# Patient Record
Sex: Male | Born: 1980
Health system: Southern US, Community
[De-identification: ages and names within clinical notes are randomized; demographics above are authoritative.]

## PROBLEM LIST (undated history)

## (undated) ENCOUNTER — Emergency Department (HOSPITAL_COMMUNITY): Admission: EM | Payer: Medicare Other | Source: Home / Self Care

## (undated) DIAGNOSIS — F419 Anxiety disorder, unspecified: Secondary | ICD-10-CM

## (undated) DIAGNOSIS — N259 Disorder resulting from impaired renal tubular function, unspecified: Secondary | ICD-10-CM

## (undated) DIAGNOSIS — R2689 Other abnormalities of gait and mobility: Secondary | ICD-10-CM

## (undated) DIAGNOSIS — E039 Hypothyroidism, unspecified: Secondary | ICD-10-CM

## (undated) DIAGNOSIS — I10 Essential (primary) hypertension: Secondary | ICD-10-CM

## (undated) DIAGNOSIS — R252 Cramp and spasm: Secondary | ICD-10-CM

## (undated) DIAGNOSIS — E669 Obesity, unspecified: Secondary | ICD-10-CM

## (undated) DIAGNOSIS — M8580 Other specified disorders of bone density and structure, unspecified site: Secondary | ICD-10-CM

## (undated) DIAGNOSIS — J986 Disorders of diaphragm: Secondary | ICD-10-CM

## (undated) DIAGNOSIS — E785 Hyperlipidemia, unspecified: Secondary | ICD-10-CM

## (undated) DIAGNOSIS — Z85841 Personal history of malignant neoplasm of brain: Secondary | ICD-10-CM

## (undated) DIAGNOSIS — R7303 Prediabetes: Secondary | ICD-10-CM

## (undated) DIAGNOSIS — Z5189 Encounter for other specified aftercare: Secondary | ICD-10-CM

## (undated) DIAGNOSIS — F988 Other specified behavioral and emotional disorders with onset usually occurring in childhood and adolescence: Secondary | ICD-10-CM

## (undated) DIAGNOSIS — E230 Hypopituitarism: Secondary | ICD-10-CM

## (undated) DIAGNOSIS — T380X5A Adverse effect of glucocorticoids and synthetic analogues, initial encounter: Secondary | ICD-10-CM

## (undated) DIAGNOSIS — J3089 Other allergic rhinitis: Secondary | ICD-10-CM

## (undated) DIAGNOSIS — G40909 Epilepsy, unspecified, not intractable, without status epilepticus: Secondary | ICD-10-CM

## (undated) HISTORY — PX: OTHER SURGICAL HISTORY: SHX169

## (undated) HISTORY — DX: Cramp and spasm: R25.2

## (undated) HISTORY — DX: Adverse effect of glucocorticoids and synthetic analogues, initial encounter: T38.0X5A

## (undated) HISTORY — DX: Hypothyroidism, unspecified: E03.9

## (undated) HISTORY — PX: PORTACATH PLACEMENT: SHX2246

## (undated) HISTORY — DX: Obesity, unspecified: E66.9

## (undated) HISTORY — DX: Other specified disorders of bone density and structure, unspecified site: M85.80

## (undated) HISTORY — DX: Disorder resulting from impaired renal tubular function, unspecified: N25.9

## (undated) HISTORY — DX: Epilepsy, unspecified, not intractable, without status epilepticus: G40.909

## (undated) HISTORY — DX: Other allergic rhinitis: J30.89

## (undated) HISTORY — DX: Anxiety disorder, unspecified: F41.9

## (undated) HISTORY — DX: Personal history of malignant neoplasm of brain: Z85.841

## (undated) HISTORY — DX: Essential (primary) hypertension: I10

## (undated) HISTORY — DX: Other specified behavioral and emotional disorders with onset usually occurring in childhood and adolescence: F98.8

## (undated) HISTORY — DX: Disorders of diaphragm: J98.6

## (undated) HISTORY — DX: Hyperlipidemia, unspecified: E78.5

## (undated) HISTORY — DX: Encounter for other specified aftercare: Z51.89

## (undated) HISTORY — DX: Hypopituitarism: E23.0

---
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
## 2001-04-23 ENCOUNTER — Encounter: Admission: RE | Admit: 2001-04-23 | Discharge: 2001-04-23 | Payer: Self-pay | Admitting: Infectious Diseases

## 2001-05-03 ENCOUNTER — Encounter: Admission: RE | Admit: 2001-05-03 | Discharge: 2001-05-03 | Payer: Self-pay | Admitting: Infectious Diseases

## 2001-05-17 ENCOUNTER — Encounter: Admission: RE | Admit: 2001-05-17 | Discharge: 2001-05-17 | Payer: Self-pay | Admitting: Infectious Diseases

## 2004-02-14 ENCOUNTER — Emergency Department (HOSPITAL_COMMUNITY): Admission: EM | Admit: 2004-02-14 | Discharge: 2004-02-15 | Payer: Self-pay | Admitting: Emergency Medicine

## 2004-02-16 ENCOUNTER — Ambulatory Visit (HOSPITAL_COMMUNITY): Admission: RE | Admit: 2004-02-16 | Discharge: 2004-02-16 | Payer: Self-pay | Admitting: Pediatrics

## 2004-03-02 ENCOUNTER — Ambulatory Visit (HOSPITAL_COMMUNITY): Admission: RE | Admit: 2004-03-02 | Discharge: 2004-03-02 | Payer: Self-pay | Admitting: Pediatrics

## 2005-10-20 ENCOUNTER — Emergency Department (HOSPITAL_COMMUNITY): Admission: EM | Admit: 2005-10-20 | Discharge: 2005-10-20 | Payer: Self-pay | Admitting: Family Medicine

## 2005-11-29 ENCOUNTER — Ambulatory Visit: Payer: Self-pay | Admitting: Internal Medicine

## 2005-12-04 ENCOUNTER — Ambulatory Visit: Payer: Self-pay | Admitting: Hospitalist

## 2005-12-27 ENCOUNTER — Ambulatory Visit: Payer: Self-pay | Admitting: Internal Medicine

## 2006-01-16 ENCOUNTER — Ambulatory Visit: Payer: Self-pay | Admitting: Internal Medicine

## 2006-02-14 ENCOUNTER — Ambulatory Visit: Payer: Self-pay | Admitting: Internal Medicine

## 2006-03-07 ENCOUNTER — Ambulatory Visit: Payer: Self-pay | Admitting: Internal Medicine

## 2006-03-28 ENCOUNTER — Ambulatory Visit: Payer: Self-pay | Admitting: Internal Medicine

## 2006-04-25 ENCOUNTER — Ambulatory Visit: Payer: Self-pay | Admitting: Internal Medicine

## 2006-05-17 ENCOUNTER — Ambulatory Visit: Payer: Self-pay | Admitting: Internal Medicine

## 2006-06-07 ENCOUNTER — Ambulatory Visit: Payer: Self-pay | Admitting: Internal Medicine

## 2006-07-04 ENCOUNTER — Ambulatory Visit: Payer: Self-pay | Admitting: Internal Medicine

## 2006-07-09 ENCOUNTER — Ambulatory Visit: Payer: Self-pay | Admitting: Internal Medicine

## 2006-07-25 ENCOUNTER — Ambulatory Visit: Payer: Self-pay | Admitting: Internal Medicine

## 2006-08-08 DIAGNOSIS — F988 Other specified behavioral and emotional disorders with onset usually occurring in childhood and adolescence: Secondary | ICD-10-CM

## 2006-08-08 DIAGNOSIS — E785 Hyperlipidemia, unspecified: Secondary | ICD-10-CM | POA: Insufficient documentation

## 2006-08-08 DIAGNOSIS — E230 Hypopituitarism: Secondary | ICD-10-CM

## 2006-08-08 DIAGNOSIS — G40909 Epilepsy, unspecified, not intractable, without status epilepticus: Secondary | ICD-10-CM

## 2006-08-08 HISTORY — DX: Other specified behavioral and emotional disorders with onset usually occurring in childhood and adolescence: F98.8

## 2006-08-08 HISTORY — DX: Hyperlipidemia, unspecified: E78.5

## 2006-08-08 HISTORY — DX: Hypopituitarism: E23.0

## 2006-08-08 HISTORY — DX: Epilepsy, unspecified, not intractable, without status epilepticus: G40.909

## 2006-08-15 ENCOUNTER — Ambulatory Visit: Payer: Self-pay | Admitting: Internal Medicine

## 2006-09-05 ENCOUNTER — Ambulatory Visit: Payer: Self-pay | Admitting: Internal Medicine

## 2006-09-26 ENCOUNTER — Ambulatory Visit: Payer: Self-pay | Admitting: Hospitalist

## 2006-10-24 ENCOUNTER — Ambulatory Visit: Payer: Self-pay | Admitting: Internal Medicine

## 2006-10-24 LAB — CONVERTED CEMR LAB
ALT: 36 units/L (ref 0–53)
Alkaline Phosphatase: 65 units/L (ref 39–117)
BUN: 14 mg/dL (ref 6–23)
CO2: 25 meq/L (ref 19–32)
Calcium: 9.1 mg/dL (ref 8.4–10.5)
Total Bilirubin: 0.6 mg/dL (ref 0.3–1.2)
Total Protein: 7.1 g/dL (ref 6.0–8.3)

## 2006-11-16 ENCOUNTER — Ambulatory Visit: Payer: Self-pay | Admitting: Hospitalist

## 2006-11-22 ENCOUNTER — Ambulatory Visit: Payer: Self-pay | Admitting: Internal Medicine

## 2006-11-22 ENCOUNTER — Encounter (INDEPENDENT_AMBULATORY_CARE_PROVIDER_SITE_OTHER): Payer: Self-pay | Admitting: Internal Medicine

## 2006-11-22 LAB — CONVERTED CEMR LAB
BUN: 10 mg/dL (ref 6–23)
Basophils Absolute: 0 10*3/uL (ref 0.0–0.1)
Basophils Relative: 0 % (ref 0–1)
Calcium: 9.9 mg/dL (ref 8.4–10.5)
Creatinine, Ser: 1.39 mg/dL (ref 0.40–1.50)
Eosinophils Relative: 1 % (ref 0–5)
Free T4: 1.53 ng/dL (ref 0.89–1.80)
HCT: 50.1 % (ref 39.0–52.0)
MCV: 96.5 fL (ref 78.0–100.0)
Monocytes Relative: 8 % (ref 3–11)
Neutrophils Relative %: 58 % (ref 43–77)
RBC: 5.19 M/uL (ref 4.22–5.81)
RDW: 15.8 % — ABNORMAL HIGH (ref 11.5–14.0)

## 2006-11-25 DIAGNOSIS — R252 Cramp and spasm: Secondary | ICD-10-CM | POA: Insufficient documentation

## 2006-11-25 HISTORY — DX: Cramp and spasm: R25.2

## 2006-12-07 ENCOUNTER — Ambulatory Visit (HOSPITAL_COMMUNITY): Admission: RE | Admit: 2006-12-07 | Discharge: 2006-12-07 | Payer: Self-pay | Admitting: Internal Medicine

## 2006-12-07 ENCOUNTER — Ambulatory Visit: Payer: Self-pay | Admitting: Internal Medicine

## 2006-12-28 ENCOUNTER — Ambulatory Visit: Payer: Self-pay | Admitting: Internal Medicine

## 2006-12-31 ENCOUNTER — Telehealth (INDEPENDENT_AMBULATORY_CARE_PROVIDER_SITE_OTHER): Payer: Self-pay | Admitting: Hospitalist

## 2007-01-02 ENCOUNTER — Telehealth: Payer: Self-pay | Admitting: *Deleted

## 2007-01-18 ENCOUNTER — Ambulatory Visit: Payer: Self-pay | Admitting: Internal Medicine

## 2007-02-05 ENCOUNTER — Ambulatory Visit: Payer: Self-pay | Admitting: Internal Medicine

## 2007-02-25 ENCOUNTER — Telehealth (INDEPENDENT_AMBULATORY_CARE_PROVIDER_SITE_OTHER): Payer: Self-pay | Admitting: *Deleted

## 2007-02-26 ENCOUNTER — Ambulatory Visit: Payer: Self-pay | Admitting: Internal Medicine

## 2007-03-18 ENCOUNTER — Telehealth (INDEPENDENT_AMBULATORY_CARE_PROVIDER_SITE_OTHER): Payer: Self-pay | Admitting: *Deleted

## 2007-03-19 ENCOUNTER — Ambulatory Visit: Payer: Self-pay | Admitting: Internal Medicine

## 2007-03-19 ENCOUNTER — Telehealth (INDEPENDENT_AMBULATORY_CARE_PROVIDER_SITE_OTHER): Payer: Self-pay | Admitting: *Deleted

## 2007-04-09 ENCOUNTER — Ambulatory Visit: Payer: Self-pay | Admitting: Internal Medicine

## 2007-04-30 ENCOUNTER — Ambulatory Visit: Payer: Self-pay | Admitting: Internal Medicine

## 2007-05-24 ENCOUNTER — Ambulatory Visit: Payer: Self-pay | Admitting: Internal Medicine

## 2007-06-14 ENCOUNTER — Telehealth (INDEPENDENT_AMBULATORY_CARE_PROVIDER_SITE_OTHER): Payer: Self-pay | Admitting: *Deleted

## 2007-06-17 ENCOUNTER — Ambulatory Visit: Payer: Self-pay | Admitting: Internal Medicine

## 2007-06-20 ENCOUNTER — Emergency Department (HOSPITAL_COMMUNITY): Admission: EM | Admit: 2007-06-20 | Discharge: 2007-06-20 | Payer: Self-pay | Admitting: Emergency Medicine

## 2007-07-02 ENCOUNTER — Telehealth: Payer: Self-pay | Admitting: Internal Medicine

## 2007-07-10 ENCOUNTER — Ambulatory Visit: Payer: Self-pay | Admitting: Internal Medicine

## 2007-07-10 LAB — CONVERTED CEMR LAB
Basophils Absolute: 0 10*3/uL (ref 0.0–0.1)
Chloride: 101 meq/L (ref 96–112)
Cholesterol: 264 mg/dL — ABNORMAL HIGH (ref 0–200)
Eosinophils Absolute: 0.1 10*3/uL (ref 0.0–0.7)
Free T4: 1.61 ng/dL (ref 0.89–1.80)
Glucose, Bld: 85 mg/dL (ref 70–99)
HDL: 49 mg/dL (ref >39)
Hemoglobin: 16.1 g/dL (ref 13.0–17.0)
Lymphocytes Relative: 43 % (ref 12–46)
MCHC: 34.2 g/dL (ref 30.0–36.0)
Potassium: 3.9 meq/L (ref 3.5–5.3)
RBC: 5.14 M/uL (ref 4.22–5.81)
RDW: 14.9 % — ABNORMAL HIGH (ref 11.5–14.0)
Testosterone: 261.11 ng/dL — ABNORMAL LOW (ref 350–890)
Total CHOL/HDL Ratio: 5.4
Total Protein: 7.6 g/dL (ref 6.0–8.3)
Triglycerides: 267 mg/dL — ABNORMAL HIGH (ref <150)
VLDL: 53 mg/dL — ABNORMAL HIGH (ref 0–40)

## 2007-07-31 ENCOUNTER — Ambulatory Visit: Payer: Self-pay | Admitting: Internal Medicine

## 2007-08-05 ENCOUNTER — Telehealth: Payer: Self-pay | Admitting: Internal Medicine

## 2007-08-05 ENCOUNTER — Telehealth: Payer: Self-pay | Admitting: *Deleted

## 2007-08-29 ENCOUNTER — Telehealth: Payer: Self-pay | Admitting: Internal Medicine

## 2007-08-29 ENCOUNTER — Ambulatory Visit: Payer: Self-pay | Admitting: Hospitalist

## 2007-09-04 ENCOUNTER — Ambulatory Visit: Payer: Self-pay | Admitting: Infectious Diseases

## 2007-09-30 ENCOUNTER — Ambulatory Visit: Payer: Self-pay | Admitting: *Deleted

## 2007-10-14 ENCOUNTER — Ambulatory Visit: Payer: Self-pay | Admitting: Internal Medicine

## 2007-10-24 ENCOUNTER — Ambulatory Visit: Payer: Self-pay | Admitting: Internal Medicine

## 2007-10-24 LAB — CONVERTED CEMR LAB
Albumin: 4.7 g/dL (ref 3.5–5.2)
BUN: 12 mg/dL (ref 6–23)
Basophils Relative: 0 % (ref 0–1)
Creatinine, Ser: 1.13 mg/dL (ref 0.40–1.50)
Eosinophils Relative: 1 % (ref 0–5)
Lymphs Abs: 3.8 10*3/uL (ref 0.7–4.0)
MCHC: 32.9 g/dL (ref 30.0–36.0)
Platelets: 181 10*3/uL (ref 150–400)
RBC: 5.06 M/uL (ref 4.22–5.81)
RDW: 15.9 % — ABNORMAL HIGH (ref 11.5–15.5)
Sodium: 144 meq/L (ref 135–145)
Total Bilirubin: 0.7 mg/dL (ref 0.3–1.2)
Total Protein: 7.6 g/dL (ref 6.0–8.3)
WBC: 9.3 10*3/uL (ref 4.0–10.5)

## 2007-11-11 ENCOUNTER — Telehealth: Payer: Self-pay | Admitting: *Deleted

## 2007-11-12 ENCOUNTER — Emergency Department (HOSPITAL_COMMUNITY): Admission: EM | Admit: 2007-11-12 | Discharge: 2007-11-12 | Payer: Self-pay | Admitting: Emergency Medicine

## 2007-11-13 ENCOUNTER — Telehealth (INDEPENDENT_AMBULATORY_CARE_PROVIDER_SITE_OTHER): Payer: Self-pay | Admitting: *Deleted

## 2007-11-14 ENCOUNTER — Emergency Department (HOSPITAL_COMMUNITY): Admission: EM | Admit: 2007-11-14 | Discharge: 2007-11-14 | Payer: Self-pay | Admitting: Emergency Medicine

## 2007-11-18 ENCOUNTER — Ambulatory Visit: Payer: Self-pay | Admitting: Internal Medicine

## 2007-11-29 ENCOUNTER — Ambulatory Visit: Payer: Self-pay | Admitting: Internal Medicine

## 2007-11-29 LAB — CONVERTED CEMR LAB
ALT: 26 units/L (ref 0–53)
AST: 19 units/L (ref 0–37)
Albumin: 4.4 g/dL (ref 3.5–5.2)
BUN: 13 mg/dL (ref 6–23)
Basophils Relative: 0 % (ref 0–1)
Chloride: 106 meq/L (ref 96–112)
Creatinine, Ser: 1.14 mg/dL (ref 0.40–1.50)
Free T4: 1.76 ng/dL (ref 0.89–1.80)
Glucose, Bld: 94 mg/dL (ref 70–99)
Lymphocytes Relative: 41 % (ref 12–46)
MCHC: 32.7 g/dL (ref 30.0–36.0)
Neutro Abs: 5.2 10*3/uL (ref 1.7–7.7)
Platelets: 216 10*3/uL (ref 150–400)
RDW: 15.4 % (ref 11.5–15.5)
Sodium: 143 meq/L (ref 135–145)
Total Bilirubin: 0.5 mg/dL (ref 0.3–1.2)
Total Protein: 7.3 g/dL (ref 6.0–8.3)
WBC: 10.1 10*3/uL (ref 4.0–10.5)

## 2007-12-09 ENCOUNTER — Ambulatory Visit: Payer: Self-pay | Admitting: Internal Medicine

## 2008-01-08 ENCOUNTER — Ambulatory Visit: Payer: Self-pay | Admitting: Internal Medicine

## 2008-01-10 ENCOUNTER — Encounter: Payer: Self-pay | Admitting: Internal Medicine

## 2008-01-21 ENCOUNTER — Telehealth: Payer: Self-pay | Admitting: Internal Medicine

## 2008-01-30 ENCOUNTER — Emergency Department (HOSPITAL_COMMUNITY): Admission: EM | Admit: 2008-01-30 | Discharge: 2008-01-30 | Payer: Self-pay | Admitting: Family Medicine

## 2008-02-07 ENCOUNTER — Ambulatory Visit: Payer: Self-pay | Admitting: Hospitalist

## 2008-03-04 ENCOUNTER — Ambulatory Visit: Payer: Self-pay | Admitting: Internal Medicine

## 2008-03-04 LAB — CONVERTED CEMR LAB
AST: 26 units/L (ref 0–37)
Albumin: 5 g/dL (ref 3.5–5.2)
Alkaline Phosphatase: 49 units/L (ref 39–117)
BUN: 21 mg/dL (ref 6–23)
Basophils Absolute: 0 10*3/uL (ref 0.0–0.1)
Basophils Relative: 0 % (ref 0–1)
CO2: 24 meq/L (ref 19–32)
Hemoglobin: 15.4 g/dL (ref 13.0–17.0)
Lymphocytes Relative: 50 % — ABNORMAL HIGH (ref 12–46)
Lymphs Abs: 5.3 10*3/uL — ABNORMAL HIGH (ref 0.7–4.0)
Neutro Abs: 4.4 10*3/uL (ref 1.7–7.7)
Neutrophils Relative %: 41 % — ABNORMAL LOW (ref 43–77)
Potassium: 3.8 meq/L (ref 3.5–5.3)
Total Bilirubin: 0.9 mg/dL (ref 0.3–1.2)

## 2008-03-11 ENCOUNTER — Telehealth: Payer: Self-pay | Admitting: *Deleted

## 2008-03-12 ENCOUNTER — Ambulatory Visit: Payer: Self-pay | Admitting: Internal Medicine

## 2008-03-12 DIAGNOSIS — R61 Generalized hyperhidrosis: Secondary | ICD-10-CM | POA: Insufficient documentation

## 2008-03-12 LAB — CONVERTED CEMR LAB
ALT: 28 units/L (ref 0–53)
AST: 21 units/L (ref 0–37)
BUN: 13 mg/dL (ref 6–23)
Basophils Absolute: 0 10*3/uL (ref 0.0–0.1)
CRP: 0.1 mg/dL (ref <0.6)
Calcium: 9.5 mg/dL (ref 8.4–10.5)
Eosinophils Absolute: 0.1 10*3/uL (ref 0.0–0.7)
Eosinophils Relative: 1 % (ref 0–5)
Hemoglobin, Urine: NEGATIVE
Hemoglobin: 14.5 g/dL (ref 13.0–17.0)
Ketones, ur: NEGATIVE mg/dL
Leukocytes, UA: NEGATIVE
Lymphocytes Relative: 39 % (ref 12–46)
Lymphs Abs: 4 10*3/uL (ref 0.7–4.0)
MCHC: 33 g/dL (ref 30.0–36.0)
Nitrite: NEGATIVE
Potassium: 3.6 meq/L (ref 3.5–5.3)
RBC: 4.68 M/uL (ref 4.22–5.81)
RDW: 14.7 % (ref 11.5–15.5)
Sodium: 141 meq/L (ref 135–145)
Specific Gravity, Urine: 1.031 — ABNORMAL HIGH (ref 1.005–1.03)
TSH: 0.007 microintl units/mL — ABNORMAL LOW (ref 0.350–5.50)
Total Bilirubin: 0.5 mg/dL (ref 0.3–1.2)
Urobilinogen, UA: 0.2 (ref 0.0–1.0)

## 2008-03-16 ENCOUNTER — Ambulatory Visit (HOSPITAL_COMMUNITY): Admission: RE | Admit: 2008-03-16 | Discharge: 2008-03-16 | Payer: Self-pay | Admitting: Internal Medicine

## 2008-03-16 ENCOUNTER — Encounter: Payer: Self-pay | Admitting: Internal Medicine

## 2008-03-27 ENCOUNTER — Ambulatory Visit: Payer: Self-pay | Admitting: Internal Medicine

## 2008-03-27 DIAGNOSIS — F809 Developmental disorder of speech and language, unspecified: Secondary | ICD-10-CM

## 2008-03-27 DIAGNOSIS — T380X5A Adverse effect of glucocorticoids and synthetic analogues, initial encounter: Secondary | ICD-10-CM

## 2008-03-27 DIAGNOSIS — M8580 Other specified disorders of bone density and structure, unspecified site: Secondary | ICD-10-CM

## 2008-03-27 HISTORY — DX: Adverse effect of glucocorticoids and synthetic analogues, initial encounter: M85.80

## 2008-03-27 LAB — CONVERTED CEMR LAB
Calcium, Total (PTH): 9.2 mg/dL (ref 8.4–10.5)
PTH: 30.6 pg/mL (ref 14.0–72.0)
Testosterone: 180.54 ng/dL — ABNORMAL LOW (ref 350–890)
Vit D, 1,25-Dihydroxy: 24 — ABNORMAL LOW (ref 30–89)

## 2008-04-03 ENCOUNTER — Telehealth: Payer: Self-pay | Admitting: *Deleted

## 2008-04-07 ENCOUNTER — Ambulatory Visit: Payer: Self-pay | Admitting: Internal Medicine

## 2008-04-07 LAB — CONVERTED CEMR LAB
Creatinine, Ser: 1.29 mg/dL (ref 0.40–1.50)
Phosphorus: 2.3 mg/dL (ref 2.3–4.6)
Potassium: 4.9 meq/L (ref 3.5–5.3)
Sodium: 148 meq/L — ABNORMAL HIGH (ref 135–145)

## 2008-04-13 ENCOUNTER — Encounter: Admission: RE | Admit: 2008-04-13 | Discharge: 2008-05-08 | Payer: Self-pay | Admitting: Internal Medicine

## 2008-04-13 ENCOUNTER — Encounter: Payer: Self-pay | Admitting: Internal Medicine

## 2008-04-17 ENCOUNTER — Telehealth: Payer: Self-pay | Admitting: Internal Medicine

## 2008-04-22 ENCOUNTER — Ambulatory Visit (HOSPITAL_COMMUNITY): Admission: RE | Admit: 2008-04-22 | Discharge: 2008-04-22 | Payer: Self-pay | Admitting: Internal Medicine

## 2008-04-22 ENCOUNTER — Ambulatory Visit: Payer: Self-pay | Admitting: Internal Medicine

## 2008-04-22 DIAGNOSIS — M79609 Pain in unspecified limb: Secondary | ICD-10-CM

## 2008-04-22 LAB — CONVERTED CEMR LAB
ALT: 56 units/L — ABNORMAL HIGH (ref 0–53)
AST: 34 units/L (ref 0–37)
BUN: 14 mg/dL (ref 6–23)
Basophils Relative: 0 % (ref 0–1)
Calcium: 9.5 mg/dL (ref 8.4–10.5)
Creatinine, Ser: 1.22 mg/dL (ref 0.40–1.50)
Eosinophils Relative: 2 % (ref 0–5)
Free T4: 1.56 ng/dL (ref 0.89–1.80)
Glucose, Bld: 80 mg/dL (ref 70–99)
Lymphs Abs: 4.2 10*3/uL — ABNORMAL HIGH (ref 0.7–4.0)
MCHC: 31.8 g/dL (ref 30.0–36.0)
MCV: 95.3 fL (ref 78.0–100.0)
Monocytes Absolute: 0.8 10*3/uL (ref 0.1–1.0)
Monocytes Relative: 8 % (ref 3–12)
Neutro Abs: 4.5 10*3/uL (ref 1.7–7.7)
Neutrophils Relative %: 47 % (ref 43–77)
Potassium: 4.1 meq/L (ref 3.5–5.3)
Sodium: 140 meq/L (ref 135–145)
Testosterone: 200.74 ng/dL — ABNORMAL LOW (ref 350–890)
Total Bilirubin: 0.5 mg/dL (ref 0.3–1.2)
Total Protein: 7.4 g/dL (ref 6.0–8.3)
WBC: 9.7 10*3/uL (ref 4.0–10.5)

## 2008-05-01 ENCOUNTER — Encounter: Payer: Self-pay | Admitting: Internal Medicine

## 2008-05-04 ENCOUNTER — Telehealth: Payer: Self-pay | Admitting: *Deleted

## 2008-05-12 ENCOUNTER — Ambulatory Visit: Payer: Self-pay | Admitting: Internal Medicine

## 2008-05-20 ENCOUNTER — Encounter (INDEPENDENT_AMBULATORY_CARE_PROVIDER_SITE_OTHER): Payer: Self-pay | Admitting: Internal Medicine

## 2008-05-20 ENCOUNTER — Ambulatory Visit: Payer: Self-pay | Admitting: Infectious Diseases

## 2008-05-20 ENCOUNTER — Ambulatory Visit (HOSPITAL_COMMUNITY): Admission: RE | Admit: 2008-05-20 | Discharge: 2008-05-20 | Payer: Self-pay | Admitting: Infectious Diseases

## 2008-05-20 LAB — CONVERTED CEMR LAB
ALT: 37 units/L (ref 0–53)
Albumin: 4.2 g/dL (ref 3.5–5.2)
Basophils Absolute: 0 10*3/uL (ref 0.0–0.1)
Basophils Relative: 0 % (ref 0–1)
Calcium: 9.3 mg/dL (ref 8.4–10.5)
Chloride: 107 meq/L (ref 96–112)
Creatinine, Ser: 1.59 mg/dL — ABNORMAL HIGH (ref 0.40–1.50)
Eosinophils Absolute: 0 10*3/uL (ref 0.0–0.7)
Eosinophils Relative: 0 % (ref 0–5)
Glucose, Bld: 89 mg/dL (ref 70–99)
Hemoglobin, Urine: NEGATIVE
Monocytes Absolute: 1 10*3/uL (ref 0.1–1.0)
Potassium: 3.6 meq/L (ref 3.5–5.3)
Protein, ur: 30 mg/dL — AB
Sodium: 146 meq/L — ABNORMAL HIGH (ref 135–145)
Specific Gravity, Urine: 1.041 — ABNORMAL HIGH (ref 1.005–1.03)
Streptococcus, Group A Screen (Direct): NEGATIVE
Total Bilirubin: 1 mg/dL (ref 0.3–1.2)
Urine Glucose: NEGATIVE mg/dL
pH: 5.5 (ref 5.0–8.0)

## 2008-05-27 ENCOUNTER — Encounter (INDEPENDENT_AMBULATORY_CARE_PROVIDER_SITE_OTHER): Payer: Self-pay | Admitting: Internal Medicine

## 2008-05-27 ENCOUNTER — Ambulatory Visit: Payer: Self-pay | Admitting: Infectious Diseases

## 2008-05-27 DIAGNOSIS — N259 Disorder resulting from impaired renal tubular function, unspecified: Secondary | ICD-10-CM | POA: Insufficient documentation

## 2008-05-27 LAB — CONVERTED CEMR LAB
BUN: 11 mg/dL (ref 6–23)
CO2: 24 meq/L (ref 19–32)
Calcium: 9.1 mg/dL (ref 8.4–10.5)
Chloride: 109 meq/L (ref 96–112)
Cholesterol: 223 mg/dL — ABNORMAL HIGH (ref 0–200)
Creatinine, Ser: 1.29 mg/dL (ref 0.40–1.50)
HDL: 60 mg/dL (ref >39)
Total CHOL/HDL Ratio: 3.7

## 2008-06-02 ENCOUNTER — Ambulatory Visit: Payer: Self-pay | Admitting: Internal Medicine

## 2008-06-02 ENCOUNTER — Encounter (INDEPENDENT_AMBULATORY_CARE_PROVIDER_SITE_OTHER): Payer: Self-pay | Admitting: Internal Medicine

## 2008-06-02 LAB — CONVERTED CEMR LAB
BUN: 17 mg/dL (ref 6–23)
CO2: 25 meq/L (ref 19–32)
Chloride: 103 meq/L (ref 96–112)
Creatinine, Ser: 1.27 mg/dL (ref 0.40–1.50)
Glucose, Bld: 90 mg/dL (ref 70–99)

## 2008-06-23 ENCOUNTER — Ambulatory Visit: Payer: Self-pay | Admitting: *Deleted

## 2008-07-01 ENCOUNTER — Ambulatory Visit: Payer: Self-pay | Admitting: Internal Medicine

## 2008-07-01 LAB — CONVERTED CEMR LAB
ALT: 27 units/L (ref 0–53)
Albumin: 4.2 g/dL (ref 3.5–5.2)
Alkaline Phosphatase: 27 units/L — ABNORMAL LOW (ref 39–117)
BUN: 14 mg/dL (ref 6–23)
Basophils Absolute: 0 10*3/uL (ref 0.0–0.1)
Bilirubin Urine: NEGATIVE
CO2: 23 meq/L (ref 19–32)
Calcium: 9 mg/dL (ref 8.4–10.5)
Chloride: 110 meq/L (ref 96–112)
Creatinine, Ser: 1.45 mg/dL (ref 0.40–1.50)
Free T4: 1.4 ng/dL (ref 0.89–1.80)
HCT: 45.7 % (ref 39.0–52.0)
Ketones, ur: NEGATIVE mg/dL
Leukocytes, UA: NEGATIVE
Lymphocytes Relative: 39 % (ref 12–46)
MCV: 94.8 fL (ref 78.0–100.0)
Monocytes Relative: 7 % (ref 3–12)
Nitrite: NEGATIVE
Platelets: 118 10*3/uL — ABNORMAL LOW (ref 150–400)
Potassium: 3.8 meq/L (ref 3.5–5.3)
Protein, ur: NEGATIVE mg/dL
RBC: 4.82 M/uL (ref 4.22–5.81)
TSH: 0.057 microintl units/mL — ABNORMAL LOW (ref 0.350–4.50)
Total Bilirubin: 0.7 mg/dL (ref 0.3–1.2)
WBC: 6.9 10*3/uL (ref 4.0–10.5)

## 2008-07-03 ENCOUNTER — Telehealth: Payer: Self-pay | Admitting: Internal Medicine

## 2008-07-08 ENCOUNTER — Telehealth: Payer: Self-pay | Admitting: Internal Medicine

## 2008-07-15 ENCOUNTER — Ambulatory Visit: Payer: Self-pay | Admitting: Internal Medicine

## 2008-07-15 LAB — CONVERTED CEMR LAB: Testosterone: 150.86 ng/dL — ABNORMAL LOW (ref 350–890)

## 2008-08-04 ENCOUNTER — Telehealth: Payer: Self-pay | Admitting: Internal Medicine

## 2008-08-06 ENCOUNTER — Ambulatory Visit: Payer: Self-pay | Admitting: Internal Medicine

## 2008-08-07 ENCOUNTER — Ambulatory Visit: Payer: Self-pay | Admitting: Internal Medicine

## 2008-08-07 ENCOUNTER — Telehealth: Payer: Self-pay | Admitting: Internal Medicine

## 2008-08-07 ENCOUNTER — Observation Stay (HOSPITAL_COMMUNITY): Admission: AD | Admit: 2008-08-07 | Discharge: 2008-08-08 | Payer: Self-pay | Admitting: Internal Medicine

## 2008-08-07 LAB — CONVERTED CEMR LAB
ALT: 34 units/L (ref 0–53)
BUN: 15 mg/dL (ref 6–23)
Basophils Absolute: 0 10*3/uL (ref 0.0–0.1)
Calcium: 9.5 mg/dL (ref 8.4–10.5)
Chloride: 107 meq/L (ref 96–112)
Creatinine, Ser: 1.46 mg/dL (ref 0.40–1.50)
Eosinophils Absolute: 0 10*3/uL (ref 0.0–0.7)
Eosinophils Relative: 0 % (ref 0–5)
Glucose, Bld: 110 mg/dL — ABNORMAL HIGH (ref 70–99)
HCT: 49.5 % (ref 39.0–52.0)
Hemoglobin, Urine: NEGATIVE
Hemoglobin: 16.4 g/dL (ref 13.0–17.0)
Monocytes Absolute: 0.5 10*3/uL (ref 0.1–1.0)
Neutrophils Relative %: 85 % — ABNORMAL HIGH (ref 43–77)
Potassium: 3.9 meq/L (ref 3.5–5.3)
RBC: 5.33 M/uL (ref 4.22–5.81)
Sodium: 145 meq/L (ref 135–145)
Total Bilirubin: 1.4 mg/dL — ABNORMAL HIGH (ref 0.3–1.2)
Urine Glucose: NEGATIVE mg/dL
Urobilinogen, UA: 1 (ref 0.0–1.0)

## 2008-08-08 ENCOUNTER — Encounter: Payer: Self-pay | Admitting: Internal Medicine

## 2008-08-27 ENCOUNTER — Ambulatory Visit: Payer: Self-pay | Admitting: Infectious Disease

## 2008-08-28 ENCOUNTER — Emergency Department (HOSPITAL_COMMUNITY): Admission: EM | Admit: 2008-08-28 | Discharge: 2008-08-28 | Payer: Self-pay | Admitting: Emergency Medicine

## 2008-08-28 ENCOUNTER — Telehealth: Payer: Self-pay | Admitting: *Deleted

## 2008-09-02 ENCOUNTER — Ambulatory Visit: Payer: Self-pay | Admitting: Internal Medicine

## 2008-09-07 ENCOUNTER — Emergency Department (HOSPITAL_COMMUNITY): Admission: EM | Admit: 2008-09-07 | Discharge: 2008-09-07 | Payer: Self-pay | Admitting: Family Medicine

## 2008-09-09 ENCOUNTER — Telehealth: Payer: Self-pay | Admitting: Internal Medicine

## 2008-09-16 ENCOUNTER — Ambulatory Visit: Payer: Self-pay | Admitting: Infectious Diseases

## 2008-09-28 ENCOUNTER — Emergency Department (HOSPITAL_COMMUNITY): Admission: EM | Admit: 2008-09-28 | Discharge: 2008-09-28 | Payer: Self-pay | Admitting: Family Medicine

## 2008-10-05 ENCOUNTER — Ambulatory Visit (HOSPITAL_COMMUNITY): Admission: RE | Admit: 2008-10-05 | Discharge: 2008-10-05 | Payer: Self-pay | Admitting: *Deleted

## 2008-10-05 ENCOUNTER — Ambulatory Visit: Payer: Self-pay | Admitting: *Deleted

## 2008-10-05 ENCOUNTER — Encounter: Payer: Self-pay | Admitting: Internal Medicine

## 2008-10-05 ENCOUNTER — Encounter (INDEPENDENT_AMBULATORY_CARE_PROVIDER_SITE_OTHER): Payer: Self-pay | Admitting: Internal Medicine

## 2008-10-05 LAB — CONVERTED CEMR LAB
Basophils Absolute: 0 10*3/uL (ref 0.0–0.1)
Basophils Relative: 0 % (ref 0–1)
Chloride: 105 meq/L (ref 96–112)
MCHC: 32.5 g/dL (ref 30.0–36.0)
Monocytes Relative: 7 % (ref 3–12)
Neutro Abs: 6.1 10*3/uL (ref 1.7–7.7)
Neutrophils Relative %: 51 % (ref 43–77)
Potassium: 3.9 meq/L (ref 3.5–5.3)
RBC: 4.94 M/uL (ref 4.22–5.81)
RDW: 14.6 % (ref 11.5–15.5)
Sodium: 144 meq/L (ref 135–145)

## 2008-10-07 ENCOUNTER — Ambulatory Visit: Payer: Self-pay | Admitting: *Deleted

## 2008-10-12 ENCOUNTER — Ambulatory Visit: Payer: Self-pay | Admitting: Infectious Diseases

## 2008-10-15 ENCOUNTER — Encounter: Payer: Self-pay | Admitting: *Deleted

## 2008-10-28 ENCOUNTER — Ambulatory Visit: Payer: Self-pay | Admitting: Internal Medicine

## 2008-10-28 ENCOUNTER — Telehealth: Payer: Self-pay | Admitting: *Deleted

## 2008-11-11 ENCOUNTER — Ambulatory Visit: Payer: Self-pay | Admitting: Infectious Diseases

## 2008-11-11 ENCOUNTER — Telehealth: Payer: Self-pay | Admitting: Infectious Diseases

## 2008-11-11 LAB — CONVERTED CEMR LAB
CO2: 27 meq/L (ref 19–32)
Chloride: 103 meq/L (ref 96–112)
Glucose, Bld: 110 mg/dL — ABNORMAL HIGH (ref 70–99)
Potassium: 4.8 meq/L (ref 3.5–5.3)
Sodium: 140 meq/L (ref 135–145)

## 2008-11-12 ENCOUNTER — Telehealth (INDEPENDENT_AMBULATORY_CARE_PROVIDER_SITE_OTHER): Payer: Self-pay | Admitting: Internal Medicine

## 2008-11-16 ENCOUNTER — Ambulatory Visit: Payer: Self-pay | Admitting: Internal Medicine

## 2008-11-16 ENCOUNTER — Encounter: Payer: Self-pay | Admitting: Infectious Diseases

## 2008-11-16 LAB — CONVERTED CEMR LAB: Vit D, 1,25-Dihydroxy: 32 (ref 30–89)

## 2008-11-17 ENCOUNTER — Telehealth: Payer: Self-pay | Admitting: *Deleted

## 2008-11-17 LAB — CONVERTED CEMR LAB
CO2: 29 meq/L (ref 19–32)
Glucose, Bld: 89 mg/dL (ref 70–99)
Phosphorus: 3.6 mg/dL (ref 2.3–4.6)
Potassium: 3.9 meq/L (ref 3.5–5.3)
Sodium: 141 meq/L (ref 135–145)

## 2008-11-18 ENCOUNTER — Ambulatory Visit: Payer: Self-pay | Admitting: Infectious Diseases

## 2008-11-19 ENCOUNTER — Encounter: Payer: Self-pay | Admitting: Internal Medicine

## 2008-11-19 LAB — CONVERTED CEMR LAB
Phosphorus, 24H Ur: 799 mg/24 hr — ABNORMAL LOW (ref 900–1300)
Phosphorus, Ur: 72.6 mg/dL

## 2008-11-24 ENCOUNTER — Ambulatory Visit: Payer: Self-pay | Admitting: Infectious Disease

## 2008-11-24 ENCOUNTER — Encounter: Payer: Self-pay | Admitting: Internal Medicine

## 2008-11-24 LAB — CONVERTED CEMR LAB: Total CK: 561 units/L — ABNORMAL HIGH (ref 7–232)

## 2008-12-02 ENCOUNTER — Ambulatory Visit: Payer: Self-pay | Admitting: Internal Medicine

## 2008-12-02 DIAGNOSIS — R509 Fever, unspecified: Secondary | ICD-10-CM

## 2008-12-02 LAB — CONVERTED CEMR LAB
Alkaline Phosphatase: 34 units/L — ABNORMAL LOW (ref 39–117)
Creatinine, Ser: 1.49 mg/dL (ref 0.40–1.50)
Free T4: 1.52 ng/dL (ref 0.89–1.80)
Glucose, Bld: 87 mg/dL (ref 70–99)
Magnesium: 2.2 mg/dL (ref 1.5–2.5)
Sodium: 144 meq/L (ref 135–145)
Total Bilirubin: 0.8 mg/dL (ref 0.3–1.2)
Total Protein: 7.3 g/dL (ref 6.0–8.3)

## 2008-12-03 ENCOUNTER — Ambulatory Visit: Payer: Self-pay | Admitting: Internal Medicine

## 2008-12-24 ENCOUNTER — Ambulatory Visit: Payer: Self-pay | Admitting: *Deleted

## 2009-01-14 ENCOUNTER — Ambulatory Visit: Payer: Self-pay | Admitting: Internal Medicine

## 2009-02-04 ENCOUNTER — Ambulatory Visit: Payer: Self-pay | Admitting: *Deleted

## 2009-02-17 ENCOUNTER — Ambulatory Visit: Payer: Self-pay | Admitting: Internal Medicine

## 2009-02-17 LAB — CONVERTED CEMR LAB
ALT: 27 units/L (ref 0–53)
AST: 22 units/L (ref 0–37)
Albumin: 4.5 g/dL (ref 3.5–5.2)
Alkaline Phosphatase: 37 units/L — ABNORMAL LOW (ref 39–117)
Free T4: 1.55 ng/dL (ref 0.80–1.80)
GFR calc non Af Amer: >60 mL/min (ref >60)
Glucose, Bld: 86 mg/dL (ref 70–99)
Potassium: 4 meq/L (ref 3.5–5.3)
Sodium: 143 meq/L (ref 135–145)
Total Bilirubin: 0.6 mg/dL (ref 0.3–1.2)
Total CK: 536 units/L — ABNORMAL HIGH (ref 7–232)
Total Protein: 7.2 g/dL (ref 6.0–8.3)

## 2009-02-21 ENCOUNTER — Emergency Department (HOSPITAL_COMMUNITY): Admission: EM | Admit: 2009-02-21 | Discharge: 2009-02-21 | Payer: Self-pay | Admitting: Emergency Medicine

## 2009-02-25 ENCOUNTER — Ambulatory Visit: Payer: Self-pay | Admitting: Internal Medicine

## 2009-03-02 ENCOUNTER — Ambulatory Visit: Payer: Self-pay | Admitting: Internal Medicine

## 2009-03-02 ENCOUNTER — Encounter (INDEPENDENT_AMBULATORY_CARE_PROVIDER_SITE_OTHER): Payer: Self-pay | Admitting: Internal Medicine

## 2009-03-02 LAB — CONVERTED CEMR LAB
Basophils Relative: 0 % (ref 0–1)
Calcium: 9.1 mg/dL (ref 8.4–10.5)
Cholesterol, target level: 200 mg/dL
Creatinine, Ser: 1.12 mg/dL (ref 0.40–1.50)
Eosinophils Absolute: 0.1 10*3/uL (ref 0.0–0.7)
GFR calc Af Amer: >60 mL/min (ref >60)
GFR calc non Af Amer: >60 mL/min (ref >60)
HDL goal, serum: 40 mg/dL
LDH: 242 units/L (ref 94–250)
LDL Goal: 160 mg/dL
Lymphs Abs: 3.7 10*3/uL (ref 0.7–4.0)
MCV: 91.4 fL (ref 78.0–100.0)
Magnesium: 2 mg/dL (ref 1.5–2.5)
Monocytes Relative: 10 % (ref 3–12)
Neutro Abs: 4.3 10*3/uL (ref 1.7–7.7)
Neutrophils Relative %: 48 % (ref 43–77)
Platelets: 170 10*3/uL (ref 150–400)
RBC: 5.01 M/uL (ref 4.22–5.81)
Sed Rate: 2 mm/hr (ref 0–16)
Sodium: 135 meq/L (ref 135–145)
WBC: 9 10*3/uL (ref 4.0–10.5)

## 2009-03-11 ENCOUNTER — Telehealth: Payer: Self-pay | Admitting: *Deleted

## 2009-03-11 ENCOUNTER — Telehealth: Payer: Self-pay | Admitting: Internal Medicine

## 2009-03-11 ENCOUNTER — Ambulatory Visit: Payer: Self-pay | Admitting: Internal Medicine

## 2009-03-17 ENCOUNTER — Ambulatory Visit: Payer: Self-pay | Admitting: Internal Medicine

## 2009-03-17 LAB — CONVERTED CEMR LAB
AST: 29 units/L (ref 0–37)
Albumin: 4.3 g/dL (ref 3.5–5.2)
Alkaline Phosphatase: 42 units/L (ref 39–117)
CK-MB: 2.2 ng/mL (ref 0.3–4.0)
Calcium: 9.1 mg/dL (ref 8.4–10.5)
Chloride: 104 meq/L (ref 96–112)
Potassium: 4.2 meq/L (ref 3.5–5.3)
Sodium: 142 meq/L (ref 135–145)
Total CK: 795 units/L — ABNORMAL HIGH (ref 7–232)
Total Protein: 7.5 g/dL (ref 6.0–8.3)

## 2009-03-18 ENCOUNTER — Telehealth: Payer: Self-pay | Admitting: *Deleted

## 2009-04-02 ENCOUNTER — Ambulatory Visit: Payer: Self-pay | Admitting: Internal Medicine

## 2009-04-02 ENCOUNTER — Ambulatory Visit (HOSPITAL_COMMUNITY): Admission: RE | Admit: 2009-04-02 | Discharge: 2009-04-02 | Payer: Self-pay | Admitting: Infectious Disease

## 2009-04-09 ENCOUNTER — Encounter: Payer: Self-pay | Admitting: Internal Medicine

## 2009-04-21 ENCOUNTER — Ambulatory Visit: Payer: Self-pay | Admitting: Internal Medicine

## 2009-04-21 LAB — CONVERTED CEMR LAB
BUN: 15 mg/dL (ref 6–23)
CO2: 25 meq/L (ref 19–32)
Calcium: 9.5 mg/dL (ref 8.4–10.5)
Chloride: 107 meq/L (ref 96–112)
Creatinine, Ser: 1.3 mg/dL (ref 0.40–1.50)
Glucose, Bld: 88 mg/dL (ref 70–99)

## 2009-05-12 ENCOUNTER — Ambulatory Visit: Payer: Self-pay | Admitting: Internal Medicine

## 2009-05-21 ENCOUNTER — Encounter: Payer: Self-pay | Admitting: Internal Medicine

## 2009-06-02 ENCOUNTER — Ambulatory Visit: Payer: Self-pay | Admitting: Internal Medicine

## 2009-06-03 ENCOUNTER — Telehealth: Payer: Self-pay | Admitting: *Deleted

## 2009-06-09 ENCOUNTER — Telehealth: Payer: Self-pay | Admitting: *Deleted

## 2009-06-11 ENCOUNTER — Encounter: Payer: Self-pay | Admitting: *Deleted

## 2009-06-23 ENCOUNTER — Ambulatory Visit: Payer: Self-pay | Admitting: Internal Medicine

## 2009-06-23 LAB — CONVERTED CEMR LAB
ALT: 30 units/L (ref 0–53)
BUN: 17 mg/dL (ref 6–23)
CO2: 25 meq/L (ref 19–32)
Calcium: 9.6 mg/dL (ref 8.4–10.5)
Chloride: 109 meq/L (ref 96–112)
Cholesterol: 234 mg/dL — ABNORMAL HIGH (ref 0–200)
Creatinine, Ser: 1.39 mg/dL (ref 0.40–1.50)
Free T4: 1.08 ng/dL (ref 0.80–1.80)
Glucose, Bld: 90 mg/dL (ref 70–99)
HDL: 56 mg/dL (ref >39)
Total CHOL/HDL Ratio: 4.2
Triglycerides: 204 mg/dL — ABNORMAL HIGH (ref <150)

## 2009-07-01 ENCOUNTER — Ambulatory Visit: Payer: Self-pay | Admitting: Internal Medicine

## 2009-09-07 ENCOUNTER — Ambulatory Visit: Payer: Self-pay | Admitting: Internal Medicine

## 2009-09-16 ENCOUNTER — Encounter: Payer: Self-pay | Admitting: Internal Medicine

## 2009-09-28 ENCOUNTER — Telehealth: Payer: Self-pay | Admitting: *Deleted

## 2009-10-05 ENCOUNTER — Ambulatory Visit: Payer: Self-pay | Admitting: Internal Medicine

## 2009-10-05 LAB — CONVERTED CEMR LAB
ALT: 32 units/L (ref 0–53)
CO2: 24 meq/L (ref 19–32)
Calcium: 9.8 mg/dL (ref 8.4–10.5)
Chloride: 104 meq/L (ref 96–112)
Creatinine, Ser: 1.1 mg/dL (ref 0.40–1.50)
Free T4: 1.49 ng/dL (ref 0.80–1.80)
Glucose, Bld: 83 mg/dL (ref 70–99)
Total CK: 604 units/L — ABNORMAL HIGH (ref 7–232)

## 2009-10-20 ENCOUNTER — Telehealth: Payer: Self-pay | Admitting: *Deleted

## 2009-10-28 ENCOUNTER — Telehealth: Payer: Self-pay | Admitting: *Deleted

## 2009-10-29 ENCOUNTER — Ambulatory Visit: Payer: Self-pay | Admitting: Infectious Diseases

## 2009-10-29 LAB — CONVERTED CEMR LAB
AST: 20 units/L (ref 0–37)
Alkaline Phosphatase: 33 units/L — ABNORMAL LOW (ref 39–117)
BUN: 15 mg/dL (ref 6–23)
Creatinine, Ser: 1.18 mg/dL (ref 0.40–1.50)
Potassium: 3.9 meq/L (ref 3.5–5.3)

## 2009-11-02 ENCOUNTER — Encounter: Payer: Self-pay | Admitting: Internal Medicine

## 2009-11-02 ENCOUNTER — Telehealth: Payer: Self-pay | Admitting: Internal Medicine

## 2009-12-24 ENCOUNTER — Telehealth: Payer: Self-pay | Admitting: Internal Medicine

## 2010-01-07 ENCOUNTER — Encounter: Payer: Self-pay | Admitting: Internal Medicine

## 2010-01-12 ENCOUNTER — Ambulatory Visit: Payer: Self-pay | Admitting: Internal Medicine

## 2010-01-19 LAB — CONVERTED CEMR LAB
Albumin: 4.6 g/dL (ref 3.5–5.2)
Alkaline Phosphatase: 34 units/L — ABNORMAL LOW (ref 39–117)
BUN: 14 mg/dL (ref 6–23)
Basophils Absolute: 0 10*3/uL (ref 0.0–0.1)
Basophils Relative: 0 % (ref 0–1)
Eosinophils Absolute: 0 10*3/uL (ref 0.0–0.7)
Eosinophils Relative: 1 % (ref 0–5)
Glucose, Bld: 96 mg/dL (ref 70–99)
HCT: 45.4 % (ref 39.0–52.0)
Hemoglobin: 14.6 g/dL (ref 13.0–17.0)
MCHC: 32.2 g/dL (ref 30.0–36.0)
Magnesium: 1.9 mg/dL (ref 1.5–2.5)
Monocytes Absolute: 0.5 10*3/uL (ref 0.1–1.0)
RDW: 13.5 % (ref 11.5–15.5)
TSH: 0.017 microintl units/mL — ABNORMAL LOW (ref 0.350–4.5)
Total Bilirubin: 0.3 mg/dL (ref 0.3–1.2)

## 2010-03-23 ENCOUNTER — Telehealth: Payer: Self-pay | Admitting: *Deleted

## 2010-04-18 ENCOUNTER — Telehealth (INDEPENDENT_AMBULATORY_CARE_PROVIDER_SITE_OTHER): Payer: Self-pay | Admitting: *Deleted

## 2010-04-27 ENCOUNTER — Encounter: Payer: Self-pay | Admitting: Internal Medicine

## 2010-08-15 ENCOUNTER — Telehealth: Payer: Self-pay | Admitting: *Deleted

## 2010-08-25 ENCOUNTER — Telehealth: Payer: Self-pay | Admitting: *Deleted

## 2010-09-02 ENCOUNTER — Ambulatory Visit: Payer: Self-pay | Admitting: Internal Medicine

## 2010-09-07 ENCOUNTER — Telehealth: Payer: Self-pay | Admitting: *Deleted

## 2010-09-19 ENCOUNTER — Telehealth: Payer: Self-pay | Admitting: *Deleted

## 2010-09-19 ENCOUNTER — Encounter: Payer: Self-pay | Admitting: Internal Medicine

## 2010-10-20 ENCOUNTER — Ambulatory Visit: Payer: Self-pay | Admitting: Internal Medicine

## 2010-10-20 LAB — CONVERTED CEMR LAB
Albumin: 4.7 g/dL (ref 3.5–5.2)
Alkaline Phosphatase: 42 units/L (ref 39–117)
CO2: 26 meq/L (ref 19–32)
Glucose, Bld: 103 mg/dL — ABNORMAL HIGH (ref 70–99)
LDL Cholesterol: 164 mg/dL — ABNORMAL HIGH (ref 0–99)
Potassium: 4.3 meq/L (ref 3.5–5.3)
Sodium: 141 meq/L (ref 135–145)
Total CK: 508 units/L — ABNORMAL HIGH (ref 7–232)
Total Protein: 7.3 g/dL (ref 6.0–8.3)
Triglycerides: 178 mg/dL — ABNORMAL HIGH (ref <150)

## 2010-10-24 ENCOUNTER — Telehealth: Payer: Self-pay | Admitting: Internal Medicine

## 2010-11-23 ENCOUNTER — Telehealth: Payer: Self-pay | Admitting: *Deleted

## 2010-11-30 ENCOUNTER — Ambulatory Visit: Admission: RE | Admit: 2010-11-30 | Discharge: 2010-11-30 | Payer: Self-pay | Source: Home / Self Care

## 2010-11-30 LAB — CONVERTED CEMR LAB
BUN: 14 mg/dL (ref 6–23)
Chloride: 107 meq/L (ref 96–112)
Potassium: 4.2 meq/L (ref 3.5–5.3)

## 2010-12-13 ENCOUNTER — Telehealth: Payer: Self-pay | Admitting: Internal Medicine

## 2010-12-13 NOTE — Progress Notes (Signed)
Summary: med change/gp  Phone Note Call from Patient   Caller: Patient's mother Summary of Call: Pt.'s mother wants Prilosec changed to Aciphex. She said she told Dr. Meredith Pel about giving the pt. some of her Aciphex and it worked better (at the last visit).  And he was going to write the Rx but she forgot to remind him.  Initial call taken by: Chinita Pester RN,  April 18, 2010 3:37 PM    New/Updated Medications: ACIPHEX 20 MG TBEC (RABEPRAZOLE SODIUM) one a day Prescriptions: ACIPHEX 20 MG TBEC (RABEPRAZOLE SODIUM) one a day  #31 x 11   Entered and Authorized by:   Zoila Shutter MD   Signed by:   Zoila Shutter MD on 04/18/2010   Method used:   Electronically to        [facility] [address] #[id number]* (retail)       [address] [place], [place]  [address]       Ph: [phone]       Fax: [phone]   RxID:   [id number]

## 2010-12-13 NOTE — Progress Notes (Signed)
Summary: phone/gg  Phone Note Call from Patient   Caller: Mom Summary of Call: pt has been taking " Aleve-D  cold and sinus" one cap every day for the  past 5 years,  plus some. Mother just thought about it and wanted to see what you thought.  Mom's sister questioned her about taking the med for so long. He took for sinus problems, he is not having problems while on it. Please advise Pt # [id number]  Initial call taken by: Merrie Roof RN,  August 15, 2010 2:19 PM  Follow-up for Phone Call        I would advise taking it only if needed for sinus discomfort and nasal congestion, not on a daily basis.  Also, please have them make an appointment with me for routine follow-up; we can discuss alternatives that may be better. Follow-up by: Margarito Liner MD,  August 15, 2010 3:55 PM  Additional Follow-up for Phone Call Additional follow up Details #1::        Pt called and no answer, message left. Merrie Roof RN  August 16, 2010 9:29 AM     Additional Follow-up for Phone Call Additional follow up Details #2::    i just spoke w/ pt's mother, gave her the info, looked at your appt schedule, may we overbook you on one of your clinic days? you have nothing left for this year. please advise Follow-up by: Marin Roberts RN,  August 16, 2010 12:02 PM  Additional Follow-up for Phone Call Additional follow up Details #3:: Details for Additional Follow-up Action Taken: Please talk with Tyler Aas.  I should have some clinics in November and early December. Additional Follow-up by: Margarito Liner MD,  August 16, 2010 6:22 PM  You do have appointments in November.  Pt mom called and message left for her to call clinic and schedule appointment in November. No answer from pt at this time Merrie Roof RN  August 18, 2010 12:11 PM   Appointment given for Nov 16th.  Mom will call if pt needs to be seen sooner. She will taper him off Aleve -D

## 2010-12-13 NOTE — Assessment & Plan Note (Signed)
Summary: cough/gg   see last note   Vital Signs:  Patient profile:   30 year old male Height:      67.5 inches (171.45 cm) Weight:      216 pounds (98.18 kg) BMI:     33.45 Temp:     97.1 degrees F (36.17 degrees C) oral Pulse rate:   776 / minute BP sitting:   127 / 74  (right arm) Cuff size:   large  Vitals Entered By: Angelina Ok RN (September 02, 2010 9:54 AM) CC: Depression Pain Assessment Patient in pain? no      Nutritional Status BMI of > 30 = obese  Have you ever been in a relationship where you felt threatened, hurt or afraid?No   Does patient need assistance? Functional Status Self care, Cook/clean, Shopping, Social activities Ambulation Impaired:Risk for fall Comments Has a dry cough.  Non-productive.  No fever or chills.  More coughging at night.  Needa a flu shot.   Primary Care Provider:  Margarito Liner MD  CC:  Depression.  History of Present Illness: 30 y/o m with  dry cough - same time of the year every year - going on since 2 weeks - mostly at night ,some in the day - treated with guifenesin earlier which work well for him - not associated with fever, runny nose, sore throat, SOB - tried OTC meds that did not help - never used flonase, albuterol, GERD meds  Depression History:      The patient denies a depressed mood most of the day and a diminished interest in his usual daily activities.         Preventive Screening-Counseling & Management  Alcohol-Tobacco     Alcohol drinks/day: 0     Smoking Status: never  Current Medications (verified): 1)  Hydrocortisone 10 Mg Tabs (Hydrocortisone) .... Take 2 Tablets By Mouth Every Morning and One Tablet By Mouth Each Afternoon (Double The Dose On Sick Days) 2)  Ddavp 0.01 % Soln (Desmopressin Acetate Spray) .... Spray 10 Micrograms Into One Nostril 4 Times A Day. 3)  Lorazepam 2 Mg Tabs (Lorazepam) .... Take 3 Tablets By Mouth Every Evening or As Directed By Physician. 4)  Multivitamins  Tabs  (Multiple Vitamin) .... Take 1 Tablet By Mouth Once A Day. 5)  Synthroid 125 Mcg Tabs (Levothyroxine Sodium) .... Take 1 Tablet By Mouth Once A Day 6)  Vitamin C 1000 Mg Tabs (Ascorbic Acid) .... Take 1 Tablet By Mouth Once A Day 7)  Cod Liver Oil   Caps (Cod Liver Oil) .... Take 1 Capsule By Mouth Once A Day 8)  Vitamin B Complex   Caps (B Complex Vitamins) .... Take 1 Capsule By Mouth Once A Day 9)  Calcium-Magnesium-Zinc 1000-400-15 Mg  Tabs (Calcium-Magnesium-Zinc) .... Take 2 Tablets By Mouth Once A Day 10)  Alendronate Sodium 70 Mg Tabs (Alendronate Sodium) .... Take 1 Tablet By Mouth Once A Week As Directed. 11)  Potassium Chloride Cr 10 Meq  Cr-Caps (Potassium Chloride) .... Take 1 Capsule By Mouth Three Times A Day With Meals 12)  Proair Hfa 108 (90 Base) Mcg/act Aers (Albuterol Sulfate) .... Take 1-2 Puffs Once Every 6 Hours As Needed For Shortness of Breath 13)  Androgel 50 Mg/5gm Gel (Testosterone) .... Apply 5 Grams Once Daily 14)  K-Phos-Neutral 161-096-045 Mg Tabs (K Phos Di & Mono-Sod Phos Mono) .... Take 1 Tablet By Mouth Three Times A Day 15)  Aciphex 20 Mg Tbec (Rabeprazole Sodium) .... One  A Day  Allergies (verified): No Known Drug Allergies  Review of Systems       The patient complains of prolonged cough.  The patient denies anorexia, fever, weight loss, weight gain, vision loss, decreased hearing, hoarseness, chest pain, syncope, dyspnea on exertion, peripheral edema, headaches, hemoptysis, abdominal pain, melena, hematochezia, severe indigestion/heartburn, hematuria, incontinence, genital sores, muscle weakness, suspicious skin lesions, transient blindness, difficulty walking, depression, unusual weight change, abnormal bleeding, enlarged lymph nodes, and angioedema.    Physical Exam  General:  Gen: VS reveiwed, Alert, well developed, nodistress ENT: mucous membranes pink & moist. No abnormal finds in ear and nose. CVC:S1 S2 , no murmurs, no abnormal heart  sounds. Lungs: Clear to auscultation B/L. No wheezes, crackles or other abnormal sounds Abdomen: soft, non distended, no tender. Normal Bowel sounds EXT: no pitting edema, no engorged veins, Pulsations normal  Neuro:alert, oriented *3, cranial nerved 2-12 intact, strenght normal in all  extremities, senstations normal to light touch.    Ears:  R ear normal and L ear normal.   Nose:  no external deformity and no nasal discharge.   Mouth:  pharynx pink and moist, no erythema, and no exudates.     Impression & Recommendations:  Problem # 1:  COUGH (ICD-786.2) dry cough since 2 weeks, every year at same time, mostly at night, responding to guiafenesin dd- season allergy, post nasal drip, cough variant asthma, GERD, DDAVP nasal spray side effect no sign and symptms of infection at this time  plan - flonase and OTC allergy meds for definite Rx - guafenesin-codiene for symptom relief - PPI and albuterol already taking - Abx if persistent in 2 weeks and s/s of infection- not needed at this point - ENT referral can be considered  Complete Medication List: 1)  Hydrocortisone 10 Mg Tabs (Hydrocortisone) .... Take 2 tablets by mouth every morning and one tablet by mouth each afternoon (double the dose on sick days) 2)  Ddavp 0.01 % Soln (Desmopressin acetate spray) .... Spray 10 micrograms into one nostril 4 times a day. 3)  Lorazepam 2 Mg Tabs (Lorazepam) .... Take 3 tablets by mouth every evening or as directed by physician. 4)  Multivitamins Tabs (Multiple vitamin) .... Take 1 tablet by mouth once a day. 5)  Synthroid 125 Mcg Tabs (Levothyroxine sodium) .... Take 1 tablet by mouth once a day 6)  Vitamin C 1000 Mg Tabs (Ascorbic acid) .... Take 1 tablet by mouth once a day 7)  Cod Liver Oil Caps (Cod liver oil) .... Take 1 capsule by mouth once a day 8)  Vitamin B Complex Caps (B complex vitamins) .... Take 1 capsule by mouth once a day 9)  Calcium-magnesium-zinc 1000-400-15 Mg Tabs  (Calcium-magnesium-zinc) .... Take 2 tablets by mouth once a day 10)  Alendronate Sodium 70 Mg Tabs (Alendronate sodium) .... Take 1 tablet by mouth once a week as directed. 11)  Potassium Chloride Cr 10 Meq Cr-caps (Potassium chloride) .... Take 1 capsule by mouth three times a day with meals 12)  Proair Hfa 108 (90 Base) Mcg/act Aers (Albuterol sulfate) .... Take 1-2 puffs once every 6 hours as needed for shortness of breath and excessive cough 13)  Androgel 50 Mg/5gm Gel (Testosterone) .... Apply 5 grams once daily 14)  K-phos-neutral 155-852-130 Mg Tabs (K phos di & mono-sod phos mono) .... Take 1 tablet by mouth three times a day 15)  Aciphex 20 Mg Tbec (Rabeprazole sodium) .... One a day 16)  Cheratussin Ac 100-10 Mg/51ml  Syrp (Guaifenesin-codeine) .... 5 ml every 4 hrs as needed for cough 17)  Fluticasone Propionate 50 Mcg/act Susp (Fluticasone propionate) .... 2 sprays in each nostrils at bedtime  Patient Instructions: 1)  Call in 1-2 weeks if cough not better 2)  He has seasonal allergy and some post nasal drip that is causing cough. The nasal spray (flonase) is for post nasal drip. He can continue taking over the counter allergy medicines for alleriges. He can also take the cough syrup for relief.  Prescriptions: PROAIR HFA 108 (90 BASE) MCG/ACT AERS (ALBUTEROL SULFATE) Take 1-2 puffs once every 6 hours as needed for shortness of breath and excessive cough  #1 x 0   Entered and Authorized by:   Bethel Born MD   Signed by:   Bethel Born MD on 09/02/2010   Method used:   Electronically to        [facility] [address] #[id number]* (retail)       [address] [place], [place]  [address]       Ph: [phone]       Fax: [phone]   RxID:   [id number] ACIPHEX 20 MG TBEC (RABEPRAZOLE SODIUM) one a day  #31 x 11   Entered and Authorized by:   Bethel Born MD   Signed by:   Bethel Born MD on 09/02/2010   Method used:   Electronically to        [facility] [address]  #[id number]* (retail)       [address] [place], [place]  [address]       Ph: [phone]       Fax: [phone]   RxID:   [id number] FLUTICASONE PROPIONATE 50 MCG/ACT SUSP (FLUTICASONE PROPIONATE) 2 sprays in each nostrils at bedtime  #1 x 0   Entered and Authorized by:   Bethel Born MD   Signed by:   Bethel Born MD on 09/02/2010   Method used:   Print then Give to Patient   RxID:   [id number] CHERATUSSIN AC 100-10 MG/5ML SYRP (GUAIFENESIN-CODEINE) 5 ml every 4 hrs as needed for cough  #250 x 0   Entered and Authorized by:   Bethel Born MD   Signed by:   Bethel Born MD on 09/02/2010   Method used:   Print then Give to Patient   RxID:   [id number]    Orders Added: 1)  Est. Patient Level IV [17616]    Prevention & Chronic Care Immunizations   Influenza vaccine: Fluvax MCR  (09/07/2009)   Influenza vaccine due: 07/14/2009    Tetanus booster: Not documented   Td booster deferral: Deferred  (09/02/2010)    Pneumococcal vaccine: Not documented  Other Screening   Smoking status: never  (09/02/2010)  Lipids   Total Cholesterol: 234  (06/23/2009)   Lipid panel action/deferral: Lipid Panel ordered   LDL: 137  (06/23/2009)   LDL Direct: Not documented   HDL: 56  (06/23/2009)   Triglycerides: 204  (06/23/2009)    SGOT (AST): 19  (01/12/2010)   BMP action: Ordered   SGPT (ALT): 19  (01/12/2010)   Alkaline phosphatase: 34  (01/12/2010)   Total bilirubin: 0.3  (01/12/2010)    Lipid flowsheet reviewed?: Yes   Progress toward LDL goal: Unchanged  Self-Management Support :    Patient will work on the following items until the next clinic visit to reach self-care goals:     Medications and  monitoring: take my medicines every day, bring all of my medications to every visit  (09/02/2010)     Eating: drink diet soda or water instead of juice or soda, eat more vegetables, use fresh or frozen vegetables, eat foods that are low in salt, eat baked foods  instead of fried foods, eat fruit for snacks and desserts, limit or avoid alcohol  (09/02/2010)     Activity: take a 30 minute walk every day  (09/02/2010)     Other: bowling  (10/05/2009)    Lipid self-management support: Written self-care plan, Education handout, Pre-printed educational material, Resources for patients handout  (09/02/2010)   Lipid self-care plan printed.   Lipid education handout printed      Resource handout printed.   Nursing Instructions: Give Flu vaccine today    Appended Document: cough/gg   see last note   Immunizations Administered:  Influenza Vaccine # 1:    Vaccine Type: Fluvax MCR    Site: right deltoid    Mfr: [employer]    Dose: 0.5 ml    Route: IM    Given by: Angelina Ok RN    Exp. Date: 05/13/2011    Lot #: [id number]    VIS given: 06/07/10 version given September 02, 2010.  Flu Vaccine Consent Questions:    Do you have a history of severe allergic reactions to this vaccine? no    Any prior history of allergic reactions to egg and/or gelatin? no    Do you have a sensitivity to the preservative Thimersol? no    Do you have a past history of Guillan-Barre Syndrome? no    Do you currently have an acute febrile illness? no    Have you ever had a severe reaction to latex? no    Vaccine information given and explained to patient? yes

## 2010-12-13 NOTE — Progress Notes (Signed)
Summary: Prior Authorization-Acidphex (changed to protonix)//kg  Phone Note Outgoing Call   Call placed by: Angelina Ok RN,  September 07, 2010 2:13 PM Call placed to: Insurer Summary of Call: Call for prior Authorization of Acidphex.  Information given .  Information was sent for review. Angelina Ok RN  September 07, 2010 2:14 PM  Initial call taken by: Angelina Ok RN,  September 07, 2010 2:14 PM  Follow-up for Phone Call        Received faxed notice that Aciphex was denied because it was not a covered drug.  They will pay for protonix which according to pt's mom has worked in the past.  Will forward information to pt's PCP to see if aciphex can be changed to protonix. Follow-up by: Cynda Familia [facility]),  September 08, 2010 12:10 PM  Additional Follow-up for Phone Call Additional follow up Details #1::        I changed to Protonix and sent rx electronically. Additional Follow-up by: Margarito Liner MD,  September 08, 2010 1:07 PM    Additional Follow-up for Phone Call Additional follow up Details #2::    Message left on recorder informing pt's family of the change.  Instructed to contact the clinic if they have any additional questions.Cynda Familia [facility])  September 12, 2010 9:14 AM   New/Updated Medications: PANTOPRAZOLE SODIUM 20 MG TBEC (PANTOPRAZOLE SODIUM) Take 1 tablet by mouth once a day Prescriptions: PANTOPRAZOLE SODIUM 20 MG TBEC (PANTOPRAZOLE SODIUM) Take 1 tablet by mouth once a day  #30 x 5   Entered and Authorized by:   Margarito Liner MD   Signed by:   Margarito Liner MD on 09/08/2010   Method used:   Electronically to        [facility] [address] #[id number]* (retail)       [address] [place], [place]  [address]       Ph: [phone]       Fax: [phone]   RxID:   [id number]

## 2010-12-13 NOTE — Assessment & Plan Note (Signed)
Summary: EST-CK/FU/MEDS/CFB   Vital Signs:  Patient profile:   30 year old male Height:      67.5 inches Weight:      224.5 pounds BMI:     34.77 Temp:     97.7 degrees F oral Pulse rate:   82 / minute BP sitting:   118 / 73  (right arm)  Vitals Entered By: Filomena Jungling NT II (January 12, 2010 11:58 AM) CC: follow-up visit Is Patient Diabetic? No Pain Assessment Patient in pain? no      Nutritional Status BMI of > 30 = obese  Have you ever been in a relationship where you felt threatened, hurt or afraid?No   Does patient need assistance? Functional Status Self care Ambulation Normal   Primary Care Provider:  Margarito Liner MD  CC:  follow-up visit.  History of Present Illness: Patient returns for follow-up of his panhypopituitarism and other chronic medical problems; he was accompanied to clinic by his mother.  He is doing well without any complaints.  He has tapered his hydrocortisone dose to 20 mg each AM and 10 mg each afternoon as advised by Dr. Sharl Ma.  He denies any cramps, night sweats, or other problems.  He has lost about 18 pounds since reducing the hydrocortisone dose and reports that he feels good.  He recently underwent the NCV/EMG test at [facility] to work up a chronic CK elevation.  He reports that he is compliant with his medication.  Preventive Screening-Counseling & Management  Alcohol-Tobacco     Alcohol drinks/day: 0     Smoking Status: never  Caffeine-Diet-Exercise     Does Patient Exercise: yes     Type of exercise: Basketball, treadmill at [facility]     Times/week: 3  Current Medications (verified): 1)  Hydrocortisone 10 Mg Tabs (Hydrocortisone) .... Take 2 Tablets By Mouth Every Morning and One Tablet By Mouth Each Afternoon (Double The Dose On Sick Days) 2)  Ddavp 0.01 % Soln (Desmopressin Acetate Spray) .... Spray 10 Micrograms Into One Nostril 4 Times A Day. 3)  Lorazepam 2 Mg Tabs (Lorazepam) .... Take 3 Tablets By Mouth Every  Evening or As Directed By Physician. 4)  Multivitamins  Tabs (Multiple Vitamin) .... Take 1 Tablet By Mouth Once A Day. 5)  Prilosec 20 Mg Cpdr (Omeprazole) .... Take 1 Tablet By Mouth Once A Day in The Morning 30 Minutes Prior To Breakfast. 6)  Synthroid 125 Mcg Tabs (Levothyroxine Sodium) .... Take 1 Tablet By Mouth Once A Day 7)  Vitamin C 1000 Mg Tabs (Ascorbic Acid) .... Take 1 Tablet By Mouth Once A Day 8)  Cod Liver Oil   Caps (Cod Liver Oil) .... Take 1 Capsule By Mouth Once A Day 9)  Vitamin B Complex   Caps (B Complex Vitamins) .... Take 1 Capsule By Mouth Once A Day 10)  Calcium-Magnesium-Zinc 1000-400-15 Mg  Tabs (Calcium-Magnesium-Zinc) .... Take 2 Tablets By Mouth Once A Day 11)  Alendronate Sodium 70 Mg Tabs (Alendronate Sodium) .... Take 1 Tablet By Mouth Once A Week As Directed. 12)  Potassium Chloride Cr 10 Meq  Cr-Caps (Potassium Chloride) .... Take 1 Capsule By Mouth Three Times A Day With Meals 13)  Proair Hfa 108 (90 Base) Mcg/act Aers (Albuterol Sulfate) .... Take 1-2 Puffs Once Every 6 Hours As Needed For Shortness of Breath 14)  Androgel 50 Mg/5gm Gel (Testosterone) .... Apply 5 Grams Once Daily  Allergies (verified): No Known Drug Allergies  Review of Systems General:  Denies chills, fever, and sweats. CV:  Denies chest pain or discomfort. Resp:  Denies shortness of breath. GI:  Denies abdominal pain, nausea, and vomiting. MS:  Denies muscle aches and cramps.  Physical Exam  General:  alert, no distress Lungs:  normal respiratory effort, normal breath sounds, no crackles, and no wheezes.   Heart:  normal rate, regular rhythm, no murmur, no gallop, and no rub.   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  He will follow-up with Dr. Sharl Ma for endocrine management.  Will check labs as below.  Orders: T-Phosphorus ([id number]) T-Magnesium  [phone]) T-CBC w/Diff [phone])  Problem # 2:  HYPOTHYROIDISM, SECONDARY (ICD-244.8) Patient is doing well on current dose of levothyroxine; will check a free T4 today.  His updated medication list for this problem includes:    Synthroid 125 Mcg Tabs (Levothyroxine sodium) .Marland Kitchen... Take 1 tablet by mouth once a day  Problem # 3:  DIABETES INSIPIDUS (ICD-253.5) Plan is continue current dose of DDAVP and check a metabolic panel today.  Problem # 4:  CPK, ABNORMAL (ICD-790.5) Plan is to obtain results of the NCV/EMG recently done at [facility] and discuss findings with Dr. Sharene Skeans.  Problem # 5:  OSTEOPOROSIS (ICD-733.00) Patient is doing well on alendronate; will continue at current dose.  His updated medication list for this problem includes:    Alendronate Sodium 70 Mg Tabs (Alendronate sodium) .Marland Kitchen... Take 1 tablet by mouth once a week as directed.  Problem # 6:  HYPOGONADISM (ICD-257.2) Patient is doing well on Androgel.  Complete Medication List: 1)  Hydrocortisone 10 Mg Tabs (Hydrocortisone) .... Take 2 tablets by mouth every morning and one tablet by mouth each afternoon (double the dose on sick days) 2)  Ddavp 0.01 % Soln (Desmopressin acetate spray) .... Spray 10 micrograms into one nostril 4 times a day. 3)  Lorazepam 2 Mg Tabs (Lorazepam) .... Take 3 tablets by mouth every evening or as directed by physician. 4)  Multivitamins Tabs (Multiple vitamin) .... Take 1 tablet by mouth once a day. 5)  Prilosec 20 Mg Cpdr (Omeprazole) .... Take 1 tablet by mouth once a day in the morning 30 minutes prior to breakfast. 6)  Synthroid 125 Mcg Tabs (Levothyroxine sodium) .... Take 1 tablet by mouth once a day 7)  Vitamin C 1000 Mg Tabs (Ascorbic acid) .... Take 1 tablet by mouth once a day 8)  Cod Liver Oil Caps (Cod liver oil) .... Take 1 capsule by mouth once a day 9)  Vitamin B Complex Caps (B complex vitamins) .... Take 1 capsule by mouth once a day 10)   Calcium-magnesium-zinc 1000-400-15 Mg Tabs (Calcium-magnesium-zinc) .... Take 2 tablets by mouth once a day 11)  Alendronate Sodium 70 Mg Tabs (Alendronate sodium) .... Take 1 tablet by mouth once a week as directed. 12)  Potassium Chloride Cr 10 Meq Cr-caps (Potassium chloride) .... Take 1 capsule by mouth three times a day with meals 13)  Proair Hfa 108 (90 Base) Mcg/act Aers (Albuterol sulfate) .... Take 1-2 puffs once every 6 hours as needed for shortness of breath 14)  Androgel 50 Mg/5gm Gel (Testosterone) .... Apply 5 grams once daily  Other Orders: T-Comprehensive Metabolic Panel [phone]) T-TSH [phone]) T-T4, Free [phone])  Patient Instructions: 1)  Please schedule a follow-up appointment in 3 months.  Process Orders Check Orders Results:     [facility]: Check  successful Tests Sent for requisitioning (January 14, 2010 4:04 PM):     01/12/2010: [facility] -- T-Comprehensive Metabolic Panel [[id number]] (signed)     01/12/2010: [facility] -- T-TSH [phone] (signed)     01/12/2010: [facility] -- [place], [place] [[id number]] (signed)     01/12/2010: [facility] -- T-Phosphorus [phone] (signed)     01/12/2010: [facility] -- T-Magnesium [[id number]] (signed)     01/12/2010: [facility] -- [facility] w/Diff [[id number]] (signed)    Prevention & Chronic Care Immunizations   Influenza vaccine: Fluvax MCR  (09/07/2009)   Influenza vaccine due: 07/14/2009    Tetanus booster: Not documented    Pneumococcal vaccine: Not documented  Other Screening   Smoking status: never  (01/12/2010)  Lipids   Total Cholesterol: 234  (06/23/2009)   Lipid panel action/deferral: Lipid Panel ordered   LDL: 137  (06/23/2009)   LDL Direct: Not documented   HDL: 56  (06/23/2009)   Triglycerides: 204  (06/23/2009)    SGOT (AST): 20  (10/29/2009)   BMP action:  Ordered   SGPT (ALT): 25  (10/29/2009) CMP ordered    Alkaline phosphatase: 33  (10/29/2009)   Total bilirubin: 0.6  (10/29/2009)  Self-Management Support :    Patient will work on the following items until the next clinic visit to reach self-care goals:     Medications and monitoring: take my medicines every day  (01/12/2010)     Eating: drink diet soda or water instead of juice or soda, eat more vegetables, use fresh or frozen vegetables, eat foods that are low in salt, eat baked foods instead of fried foods, eat fruit for snacks and desserts  (01/12/2010)     Activity: take a 30 minute walk every day  (01/12/2010)     Other: bowling  (10/05/2009)    Lipid self-management support: Education handout, Resources for patients handout  (01/12/2010)     Lipid education handout printed      Resource handout printed.

## 2010-12-13 NOTE — Progress Notes (Signed)
Summary: Refill Request for Lorazepam  Phone Note Refill Request Message from:  Pharmacy  Refills Requested: Medication #1:  LORAZEPAM 2 MG TABS Take 3 tablets by mouth every evening or as directed by physician.   Last Refilled: 02/08/2010 Electronic refill request received.  Initial call taken by: Margarito Liner MD,  Mar 23, 2010 3:46 PM  Follow-up for Phone Call        Refill approved-nurse to complete. Follow-up by: Margarito Liner MD,  Mar 23, 2010 3:47 PM  Additional Follow-up for Phone Call Additional follow up Details #1::        Rx called to pharmacy Additional Follow-up by: Merrie Roof RN,  Mar 23, 2010 3:57 PM    Prescriptions: LORAZEPAM 2 MG TABS (LORAZEPAM) Take 3 tablets by mouth every evening or as directed by physician.  #150 x 3   Entered and Authorized by:   Margarito Liner MD   Signed by:   Margarito Liner MD on 03/23/2010   Method used:   Telephoned to ...       [facility] [id number]* (retail)       [address] [place], [place]  [address]       Ph: [phone]       Fax: [phone]   RxID:   [id number]

## 2010-12-13 NOTE — Progress Notes (Signed)
Summary: Refill for Lorazepam  Phone Note Refill Request Message from:  Pharmacy  Refills Requested: Medication #1:  LORAZEPAM 2 MG TABS Take 3 tablets by mouth every evening or as directed by physician.   Last Refilled: 08/15/2010 Electronic refill request received.  Initial call taken by: Margarito Liner MD,  September 19, 2010 3:22 PM  Follow-up for Phone Call        Refill approved-nurse to complete. Follow-up by: Margarito Liner MD,  September 19, 2010 3:22 PM  Additional Follow-up for Phone Call Additional follow up Details #1::        Rx called to pharmacy Additional Follow-up by: Marin Roberts RN,  September 22, 2010 2:23 PM    Prescriptions: LORAZEPAM 2 MG TABS (LORAZEPAM) Take 3 tablets by mouth every evening or as directed by physician.  #150 x 3   Entered and Authorized by:   Margarito Liner MD   Signed by:   Margarito Liner MD on 09/19/2010   Method used:   Telephoned to ...       [facility] [address] [id number]* (retail)       [address] [place], [place]  [address]       Ph: [phone]       Fax: [phone]   RxID:   [id number]

## 2010-12-13 NOTE — Progress Notes (Signed)
Summary: Referral for NCV/EMG to Evaluate Elevated CPK  Phone Note Outgoing Call   Call placed by: Margarito Liner MD,  December 24, 2009 12:16 PM Call placed to: Specialist Summary of Call: I spoke by phone with patient's neurologist Dr. Sharene Skeans today about the chronically elevated CK levels.  He suggested obtaining nerve conduction studies and EMG to help with the decision about whether muscle biopsy is indicated.  I spoke with the appointment scheduling staff at [facility] today and requested a referral for NCV/EMG.  They will contact patient and will let me know when this has been scheduled.  I also spoke with patient's mother and explained the plans to her.  She reports that patient has recently been doing well on a new vitamin supplement and has not been having cramps recently.  I have requested that a return appointment be made with me when available, and I asked her to bring in the new supplement to the next clinic visit so I can see what he has been taking. Initial call taken by: Margarito Liner MD,  December 24, 2009 12:21 PM      Impression & Recommendations:  Problem # 1:  CPK, ABNORMAL (ICD-790.5) I spoke by phone with patient's neurologist Dr. Sharene Skeans today about the chronically elevated CK levels.  He suggested obtaining nerve conduction studies and EMG to help with the decision about whether muscle biopsy is indicated.  I spoke with the appointment scheduling staff at [facility] today and requested a referral for NCV/EMG.  They will contact patient and will let me know when this has been scheduled.  I also spoke with patient's mother and explained the plans to her.  She reports that patient has recently been doing well on a new vitamin supplement and has not been having cramps recently.  I have requested that a return appointment be made with me when available, and I asked her to bring in the new supplement to the next clinic visit so I can see  what he has been taking.  Orders: Nerve Conduction (Nerve Conduction)

## 2010-12-13 NOTE — Consult Note (Signed)
Summary: [facility]  [facility]   Imported By: Louretta Parma 10/13/2010 09:40:40  _____________________________________________________________________  External Attachment:    Type:   Image     Comment:   External Document

## 2010-12-13 NOTE — Consult Note (Signed)
Summary: [facility]   Imported By: Florinda Marker 01/26/2010 16:21:20  _____________________________________________________________________  External Attachment:    Type:   Image     Comment:   External Document

## 2010-12-13 NOTE — Consult Note (Signed)
[facility]   Imported By: Margie Billet 05/02/2010 11:48:51  _____________________________________________________________________  External Attachment:    Type:   Image     Comment:   External Document

## 2010-12-13 NOTE — Progress Notes (Signed)
Summary: phone/gg  Phone Note Call from Patient  on August 25, 2010 11:42 AM  Caller: Mom Summary of Call: Pt Mother called stating pt has had cough x 2 weeks.  She has given OTC meds.  Fever on and off. ( 99.2 ) Pt is weaning off Aleve cold and sinus. cough is non-productive. No other problems.  GUAIFENESIN-CODEINE 300-10 MG/5ML LIQD has worked in past. Pt is eating and drinking fine.    Do you want to try Rx cough med or would you like the pt to be seen? Initial call taken by: Merrie Roof RN,  August 25, 2010 11:42 AM  Follow-up for Phone Call        Given the duration of his cough, I would suggest that he be seen prior to prescribing a cough medication. Follow-up by: Margarito Liner MD,  August 26, 2010 11:36 AM  Additional Follow-up for Phone Call Additional follow up Details #1::        Unable to talk with Mom.  [place] home and will tell Mom I called and will return call on Monday Additional Follow-up by: Merrie Roof RN,  August 26, 2010 5:27 PM    Additional Follow-up for Phone Call Additional follow up Details #2::    appointment scheduled for Friday. Pt Dad is aware. Follow-up by: Merrie Roof RN,  August 31, 2010 5:06 PM

## 2010-12-13 NOTE — Consult Note (Signed)
Summary: [facility]  [facility]  [facility]   Imported By: Florinda Marker 11/19/2009 14:27:56  _____________________________________________________________________  External Attachment:    Type:   Image     Comment:   External Document

## 2010-12-15 NOTE — Assessment & Plan Note (Signed)
Summary: per dr Meredith Pel, leg cramps/pcp-joines/hla   Vital Signs:  Patient profile:   30 year old male Height:      67.5 inches (171.45 cm) Weight:      214.7 pounds (97.59 kg) BMI:     33.25 Temp:     97.4 degrees F oral Pulse rate:   69 / minute BP sitting:   114 / 72  (right arm) Cuff size:   large  Vitals Entered By: Chinita Pester RN (November 30, 2010 8:26 AM) CC: Right leg cramps; no cramps in about 2 days. Is Patient Diabetic? No Pain Assessment Patient in pain? no      Nutritional Status BMI of > 30 = obese  Have you ever been in a relationship where you felt threatened, hurt or afraid?Unable to ask; father w/pt.   Does patient need assistance? Functional Status Self care Ambulation Normal   Primary Care Provider:  Margarito Liner MD  CC:  Right leg cramps; no cramps in about 2 days.Marland Kitchen  History of Present Illness: Pt is a 30 y/o M with PMH outlined in the EMR who presents today for c/o leg cramps in both legs; more cramping in right leg.  These are a chronic problem for Nicholas Holt.  Pt reports cramps mostly occur at night and in his calf muscle.   Pt has not been taking baclofen; review of chart indicates medication was d/c'd in 01/2010 2/2 pt preference; pt denies any adverse effects from medication.  Pt denies fever, chills, nausea, vomiting, diarrhea, chest pain, sob, problems with urination/bowel movements, and no dark colored urine.  He has no other concerns or complaints at this time.  Depression History:      The patient denies a depressed mood most of the day and a diminished interest in his usual daily activities.         Preventive Screening-Counseling & Management  Alcohol-Tobacco     Alcohol drinks/day: 0     Smoking Status: never  Caffeine-Diet-Exercise     Does Patient Exercise: yes     Type of exercise: Basketball, treadmill at [facility]     Times/week: 3  Current Medications (verified): 1)  Hydrocortisone 10 Mg Tabs (Hydrocortisone) .... Take  2 Tablets By Mouth Every Morning and One Tablet By Mouth Each Afternoon (Double The Dose On Sick Days) 2)  Ddavp 0.01 % Soln (Desmopressin Acetate Spray) .... Spray 10 Micrograms Into One Nostril 4 Times A Day. 3)  Lorazepam 2 Mg Tabs (Lorazepam) .... Take 3 Tablets By Mouth Every Evening or As Directed By Physician. 4)  Multivitamins  Tabs (Multiple Vitamin) .... Take 1 Tablet By Mouth Once A Day. 5)  Synthroid 125 Mcg Tabs (Levothyroxine Sodium) .... Take 1 Tablet By Mouth Once A Day 6)  Vitamin C 1000 Mg Tabs (Ascorbic Acid) .... Take 1 Tablet By Mouth Once A Day 7)  Cod Liver Oil   Caps (Cod Liver Oil) .... Take 1 Capsule By Mouth Once A Day 8)  Vitamin B Complex   Caps (B Complex Vitamins) .... Take 1 Capsule By Mouth Once A Day 9)  Calcium-Magnesium-Zinc 1000-400-15 Mg  Tabs (Calcium-Magnesium-Zinc) .... Take 2 Tablets By Mouth Once A Day 10)  Alendronate Sodium 70 Mg Tabs (Alendronate Sodium) .... Take 1 Tablet By Mouth Once A Week As Directed. 11)  Potassium Chloride Cr 10 Meq  Cr-Caps (Potassium Chloride) .... Take 1 Capsule By Mouth Three Times A Day With Meals 12)  Proair Hfa 108 (90 Base) Mcg/act Aers (  Albuterol Sulfate) .... Take 1-2 Puffs Once Every 6 Hours As Needed For Shortness of Breath and Excessive Cough 13)  Androgel 50 Mg/5gm Gel (Testosterone) .... Apply 5 Grams Once Daily 14)  Pantoprazole Sodium 20 Mg Tbec (Pantoprazole Sodium) .... Take 1 Tablet By Mouth Once A Day 15)  Fluticasone Propionate 50 Mcg/act Susp (Fluticasone Propionate) .... 2 Sprays in Each Nostrils At Bedtime 16)  Cyclobenzaprine Hcl 5 Mg Tabs (Cyclobenzaprine Hcl) .... Take 1 Tab By Mouth At Bedtime.  May Take 1 Tab Three Times A Day As Needed For Muscle Cramps  Allergies (verified): No Known Drug Allergies  Past History:  Past medical, surgical, family and social histories (including risk factors) reviewed for relevance to current acute and chronic problems.  Past Medical History: Reviewed history  from 04/02/2009 and no changes required. LEG CRAMPS SEIZURE DISORDER HYPOPITUITARISM    - HYPOGONADISM    - HYPOTHYROIDISM, SECONDARY SYMPTOM, MEMORY LOSS NEOPLASM, MALIGNANT, BRAIN, HX OF     - DIABETES INSIPIDUS    - HEMIPARESIS, LEFT     - STATUS, VENTRICULAR SHUNT Hx of ATTENTION DEFICIT DISORDER HYPERCHOLESTEROLEMIA OSTEOPOROSIS  Family History: Reviewed history from 10/20/2010 and no changes required. Father had prostate cancer in early 29s. Uncle died of brain cancer late 48s. Mother has hypertension. Father has high cholesterol and hypertension.  Social History: Reviewed history from 10/20/2010 and no changes required. Lives with parents. No living siblings; stepsister had Type I DM, died in early 49s. His mother works 2 jobs and his father stays at home with him. Father takes him to his appointments. Mother always sends a note for Dr. Meredith Pel on appointments.  Physical Exam  General:  alert, no distresswell-developed, well-nourished, and well-hydrated.   Eyes:  pupils equal, pupils round, and pupils reactive to light.  sclera anicteric.  conjunctiva without pallor or injection. Mouth:  pharynx pink and moist, no erythema, and no exudates.   Lungs:  normal respiratory effort, normal breath sounds, no crackles, and no wheezes.   Heart:  normal rate, regular rhythm, no murmur, no gallop, and no rub.   Msk:  no joint tenderness, no joint swelling, no joint warmth, and no redness over joints.   Extremities:  no edema, clubbing, cyanosis, erythema, or other abnormality. Neurologic:  strength normal in all extremities, sensation intact to light touch, gait normal, and DTRs symmetrical and normal.   Skin:  turgor normal, color normal, and no rashes.   Psych:  normally interactive and good eye contact.     Impression & Recommendations:  Problem # 1:  LEG CRAMPS (ICD-729.82) This is a chronic problem for Nicholas Holt and his current presentation is consistent with prior  symptoms.  There is no evidence of DVT, or other concerning pathology on exam; pt is hemodynamically stable with without signs or symptoms of systemic illness on exam or hx.  WIll check a BMET today to assess for any electrolyte abnormality.  Pt was previously on baclofen; this medication was d/c'd 2/2 pt preference.  Will try flexeril for symptom relief.  Pt advised to f/u for worsening symptoms or other concerning problems.   Orders: T-Basic Metabolic Panel [id number])  Complete Medication List: 1)  Hydrocortisone 10 Mg Tabs (Hydrocortisone) .... Take 2 tablets by mouth every morning and one tablet by mouth each afternoon (double the dose on sick days) 2)  Ddavp 0.01 % Soln (Desmopressin acetate spray) .... Spray 10 micrograms into one nostril 4 times a day. 3)  Lorazepam 2 Mg Tabs (Lorazepam) .Marland KitchenMarland KitchenMarland Kitchen  Take 3 tablets by mouth every evening or as directed by physician. 4)  Multivitamins Tabs (Multiple vitamin) .... Take 1 tablet by mouth once a day. 5)  Synthroid 125 Mcg Tabs (Levothyroxine sodium) .... Take 1 tablet by mouth once a day 6)  Vitamin C 1000 Mg Tabs (Ascorbic acid) .... Take 1 tablet by mouth once a day 7)  Cod Liver Oil Caps (Cod liver oil) .... Take 1 capsule by mouth once a day 8)  Vitamin B Complex Caps (B complex vitamins) .... Take 1 capsule by mouth once a day 9)  Calcium-magnesium-zinc 1000-400-15 Mg Tabs (Calcium-magnesium-zinc) .... Take 2 tablets by mouth once a day 10)  Alendronate Sodium 70 Mg Tabs (Alendronate sodium) .... Take 1 tablet by mouth once a week as directed. 11)  Potassium Chloride Cr 10 Meq Cr-caps (Potassium chloride) .... Take 1 capsule by mouth three times a day with meals 12)  Proair Hfa 108 (90 Base) Mcg/act Aers (Albuterol sulfate) .... Take 1-2 puffs once every 6 hours as needed for shortness of breath and excessive cough 13)  Androgel 50 Mg/5gm Gel (Testosterone) .... Apply 5 grams once daily 14)  Pantoprazole Sodium 20 Mg Tbec (Pantoprazole sodium)  .... Take 1 tablet by mouth once a day 15)  Fluticasone Propionate 50 Mcg/act Susp (Fluticasone propionate) .... 2 sprays in each nostrils at bedtime 16)  Cyclobenzaprine Hcl 5 Mg Tabs (Cyclobenzaprine hcl) .... Take 1 tab by mouth at bedtime.  may take 1 tab three times a day as needed for muscle cramps  Patient Instructions: 1)  Please schedule a follow-up appointment as needed. 2)  I will call you if any of your lab work is abnormal. 3)  Flexeril (cyclobenzaprine) is a medicine to help with muscle cramps.  This medication may make you sleepy.  Use as directed. Prescriptions: CYCLOBENZAPRINE HCL 5 MG TABS (CYCLOBENZAPRINE HCL) Take 1 tab by mouth at bedtime.  May take 1 tab three times a day as needed for muscle cramps  #90 x 3   Entered and Authorized by:   Nelda Bucks DO   Signed by:   Nelda Bucks DO on 11/30/2010   Method used:   Electronically to        [id number]* (retail)       [address] [place], [place]  [address]       Ph: [phone]       Fax: [phone]   RxID:   [id number]    Orders Added: 1)  T-Basic Metabolic Panel [id number] 2)  Est. Patient Level III [32355]    Prevention & Chronic Care Immunizations   Influenza vaccine: Fluvax MCR  (09/02/2010)   Influenza vaccine due: 07/14/2009    Tetanus booster: Not documented   Td booster deferral: Deferred  (09/02/2010)    Pneumococcal vaccine: Not documented  Other Screening   Smoking status: never  (11/30/2010)  Lipids   Total Cholesterol: 240  (10/20/2010)   Lipid panel action/deferral: Lipid Panel ordered   LDL: 164  (10/20/2010)   LDL Direct: Not documented   HDL: 40  (10/20/2010)   Triglycerides: 178  (10/20/2010)    SGOT (AST): 22  (10/20/2010)   BMP action: Ordered   SGPT (ALT): 25  (10/20/2010)   Alkaline phosphatase: 42  (10/20/2010)   Total bilirubin: 0.3  (10/20/2010)  Self-Management Support :    Patient will work on the following items until the  next clinic visit to reach  self-care goals:     Medications and monitoring: take my medicines every day, bring all of my medications to every visit  (11/30/2010)     Eating: eat more vegetables, use fresh or frozen vegetables, eat baked foods instead of fried foods  (11/30/2010)     Activity: take a 30 minute walk every day  (11/30/2010)     Other: bowling  (10/05/2009)    Lipid self-management support: Written self-care plan  (11/30/2010)   Lipid self-care plan printed.  Process Orders Check Orders Results:     [facility]: Check successful Tests Sent for requisitioning (November 30, 2010 8:52 AM):     11/30/2010: [facility] -- T-Basic Metabolic Panel [phone] (signed)

## 2010-12-15 NOTE — Assessment & Plan Note (Signed)
Summary: review meds/gg   Vital Signs:  Patient profile:   30 year old male Height:      67.5 inches Weight:      216.5 pounds BMI:     33.53 Temp:     98.5 degrees F oral Pulse rate:   75 / minute BP sitting:   128 / 77  Vitals Entered By: Filomena Jungling NT II (October 20, 2010 3:13 PM) CC: checkup Is Patient Diabetic? No Pain Assessment Patient in pain? no      Nutritional Status BMI of > 30 = obese  Have you ever been in a relationship where you felt threatened, hurt or afraid?No   Does patient need assistance? Functional Status Self care Ambulation Normal   Primary Care Provider:  Margarito Liner MD  CC:  checkup.  History of Present Illness: Patient returns for follow-up of his panhypopituitarism (with associated diabetes insipidus, secondary hypothyroidism, adrenal insufficiency, and hypogonadism), osteoporosis, and other chronic problems; he was accompanied to clinic by his mother.  He is feeling well with no complaints.  He has intentionally lost weight this year and feels much better.  He is followed by Dr. Sharl Ma for endocrinoloogy management, and is doing well on his current regimen.  He reports that he is compliant with his medications.    Preventive Screening-Counseling & Management  Alcohol-Tobacco     Alcohol drinks/day: 0     Smoking Status: never  Caffeine-Diet-Exercise     Does Patient Exercise: yes     Type of exercise: Basketball, treadmill at [facility]     Times/week: 3  Current Medications (verified): 1)  Hydrocortisone 10 Mg Tabs (Hydrocortisone) .... Take 2 Tablets By Mouth Every Morning and One Tablet By Mouth Each Afternoon (Double The Dose On Sick Days) 2)  Ddavp 0.01 % Soln (Desmopressin Acetate Spray) .... Spray 10 Micrograms Into One Nostril 4 Times A Day. 3)  Lorazepam 2 Mg Tabs (Lorazepam) .... Take 3 Tablets By Mouth Every Evening or As Directed By Physician. 4)  Multivitamins  Tabs (Multiple Vitamin) .... Take 1 Tablet By Mouth Once A  Day. 5)  Synthroid 125 Mcg Tabs (Levothyroxine Sodium) .... Take 1 Tablet By Mouth Once A Day 6)  Vitamin C 1000 Mg Tabs (Ascorbic Acid) .... Take 1 Tablet By Mouth Once A Day 7)  Cod Liver Oil   Caps (Cod Liver Oil) .... Take 1 Capsule By Mouth Once A Day 8)  Vitamin B Complex   Caps (B Complex Vitamins) .... Take 1 Capsule By Mouth Once A Day 9)  Calcium-Magnesium-Zinc 1000-400-15 Mg  Tabs (Calcium-Magnesium-Zinc) .... Take 2 Tablets By Mouth Once A Day 10)  Alendronate Sodium 70 Mg Tabs (Alendronate Sodium) .... Take 1 Tablet By Mouth Once A Week As Directed. 11)  Potassium Chloride Cr 10 Meq  Cr-Caps (Potassium Chloride) .... Take 1 Capsule By Mouth Three Times A Day With Meals 12)  Proair Hfa 108 (90 Base) Mcg/act Aers (Albuterol Sulfate) .... Take 1-2 Puffs Once Every 6 Hours As Needed For Shortness of Breath and Excessive Cough 13)  Androgel 50 Mg/5gm Gel (Testosterone) .... Apply 5 Grams Once Daily 14)  Pantoprazole Sodium 20 Mg Tbec (Pantoprazole Sodium) .... Take 1 Tablet By Mouth Once A Day 15)  Fluticasone Propionate 50 Mcg/act Susp (Fluticasone Propionate) .... 2 Sprays in Each Nostrils At Bedtime  Allergies (verified): No Known Drug Allergies  Family History: Father had prostate cancer in early 23s. Uncle died of brain cancer late 33s. Mother has hypertension.  Father has high cholesterol and hypertension.  Social History: Lives with parents. No living siblings; stepsister had Type I DM, died in early 57s. His mother works 2 jobs and his father stays at home with him. Father takes him to his appointments. Mother always sends a note for Dr. Meredith Pel on appointments.  Review of Systems General:  Denies chills, fatigue, loss of appetite, and sweats. Eyes:  Denies blurring and double vision. ENT:  Denies decreased hearing. CV:  Denies chest pain or discomfort, difficulty breathing at night, difficulty breathing while lying down, and swelling of feet. Resp:  Denies cough and  wheezing. GI:  Denies abdominal pain, bloody stools, dark tarry stools, nausea, and vomiting. GU:  Denies dysuria. MS:  Denies muscle aches and cramps. Neuro:  Denies weakness. Psych:  Denies anxiety and depression.  Physical Exam  General:  alert, no distress Lungs:  normal respiratory effort, normal breath sounds, no crackles, and no wheezes.   Heart:  normal rate, regular rhythm, no murmur, no gallop, and no rub.   Abdomen:  soft, non-tender, normal bowel sounds, no hepatomegaly, and no splenomegaly.   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 He is followed by endocrinologist Dr. Talmage Coin, and recently saw Dr. Sharl Ma. Will check a metabolic panel today.  Orders: T-Comprehensive Metabolic Panel ([id number])  Problem # 2:  HYPOTHYROIDISM, SECONDARY (ICD-244.8) Patient is doing well on current dose of Synthroid.  His updated medication list for this problem includes:    Synthroid 125 Mcg Tabs (Levothyroxine sodium) .Marland Kitchen... Take 1 tablet by mouth once a day  Labs Reviewed: TSH: 0.017 (01/12/2010)    Chol: 234 (06/23/2009)   HDL: 56 (06/23/2009)   LDL: 137 (06/23/2009)   TG: 204 (06/23/2009)  Problem # 3:  DIABETES INSIPIDUS (ICD-253.5) Patient is doing well on current dose of DDAVP. Will check a metabolic panel as above.  Problem # 4:  HYPOGONADISM (ICD-257.2) Patient is doing well on testosterone gel.  Problem # 5:  OSTEOPOROSIS (ICD-733.00) Patient is taking alendronate without apparent problems.  His updated medication list for this problem includes:    Alendronate Sodium 70 Mg Tabs (Alendronate sodium) .Marland Kitchen... Take 1 tablet by mouth once a week as directed.  Problem # 6:  HYPERCHOLESTEROLEMIA (ICD-272.0) Patient is on no medications. Will check a lipid profile.  Orders: T-Lipid Profile [id number])  Labs Reviewed: SGOT: 19 (01/12/2010)   SGPT: 19  (01/12/2010)  Lipid Goals: Chol Goal: 200 (03/02/2009)   HDL Goal: 40 (03/02/2009)   LDL Goal: 160 (03/02/2009)   TG Goal: 150 (03/02/2009)  Prior 10 Yr Risk Heart Disease: 2 % (03/02/2009)   HDL:56 (06/23/2009), 60 (05/27/2008)  LDL:137 (06/23/2009), 138 (05/27/2008)  Chol:234 (06/23/2009), 223 (05/27/2008)  Trig:204 (06/23/2009), 125 (05/27/2008)  Problem # 7:  CPK, ABNORMAL (ICD-790.5) Patient has stable chronic CPK elevation, and had a nerve conduction study/EMG done earlier this year at [facility]. Will check a CPK today.  He has no symptoms of myopathy.  Will discuss with Dr. Sharene Skeans whether further workup is warranted in the absence of symptoms.  Orders: T-CK Total [id number])  Complete Medication List: 1)  Hydrocortisone 10 Mg Tabs (Hydrocortisone) .... Take 2 tablets by mouth every morning and one tablet by mouth each afternoon (double the dose on sick days) 2)  Ddavp 0.01 % Soln (Desmopressin acetate spray) .... Spray 10 micrograms into one nostril 4 times a day. 3)  Lorazepam 2 Mg Tabs (Lorazepam) .... Take 3 tablets by mouth every evening or as directed by physician. 4)  Multivitamins Tabs (Multiple vitamin) .... Take 1 tablet by mouth once a day. 5)  Synthroid 125 Mcg Tabs (Levothyroxine sodium) .... Take 1 tablet by mouth once a day 6)  Vitamin C 1000 Mg Tabs (Ascorbic acid) .... Take 1 tablet by mouth once a day 7)  Cod Liver Oil Caps (Cod liver oil) .... Take 1 capsule by mouth once a day 8)  Vitamin B Complex Caps (B complex vitamins) .... Take 1 capsule by mouth once a day 9)  Calcium-magnesium-zinc 1000-400-15 Mg Tabs (Calcium-magnesium-zinc) .... Take 2 tablets by mouth once a day 10)  Alendronate Sodium 70 Mg Tabs (Alendronate sodium) .... Take 1 tablet by mouth once a week as directed. 11)  Potassium Chloride Cr 10 Meq Cr-caps (Potassium chloride) .... Take 1 capsule by mouth three times a day with meals 12)  Proair Hfa 108 (90 Base) Mcg/act Aers (Albuterol  sulfate) .... Take 1-2 puffs once every 6 hours as needed for shortness of breath and excessive cough 13)  Androgel 50 Mg/5gm Gel (Testosterone) .... Apply 5 grams once daily 14)  Pantoprazole Sodium 20 Mg Tbec (Pantoprazole sodium) .... Take 1 tablet by mouth once a day 15)  Fluticasone Propionate 50 Mcg/act Susp (Fluticasone propionate) .... 2 sprays in each nostrils at bedtime  Patient Instructions: 1)  Please schedule a follow-up appointment in 3 months.   Orders Added: 1)  T-Lipid Profile [[id number]] 2)  T-Comprehensive Metabolic Panel [[id number]] 3)  T-CK Total [[id number]] 4)  Est. Patient Level III [11914]    Process Orders Check Orders Results:     [facility]: Check successful Tests Sent for requisitioning (October 24, 2010 6:23 PM):     10/20/2010: [facility] -- T-Lipid Profile [phone] (signed)     10/20/2010: [facility] -- T-Comprehensive Metabolic Panel [[id number]] (signed)     10/20/2010: [facility] -- T-CK Total [[id number]] (signed)     Prevention & Chronic Care Immunizations   Influenza vaccine: Fluvax MCR  (09/02/2010)   Influenza vaccine due: 07/14/2009    Tetanus booster: Not documented   Td booster deferral: Deferred  (09/02/2010)    Pneumococcal vaccine: Not documented  Other Screening   Smoking status: never  (10/20/2010)  Lipids   Total Cholesterol: 234  (06/23/2009)   Lipid panel action/deferral: Lipid Panel ordered   LDL: 137  (06/23/2009)   LDL Direct: Not documented   HDL: 56  (06/23/2009)   Triglycerides: 204  (06/23/2009)    SGOT (AST): 19  (01/12/2010)   BMP action: Ordered   SGPT (ALT): 19  (01/12/2010) CMP ordered    Alkaline phosphatase: 34  (01/12/2010)   Total bilirubin: 0.3  (01/12/2010)    Lipid flowsheet reviewed?: Yes   Progress toward LDL goal: Unchanged  Self-Management Support :    Lipid self-management support: Written self-care  plan, Education handout, Pre-printed educational material, Resources for patients handout  (09/02/2010)

## 2010-12-15 NOTE — Progress Notes (Signed)
Summary: Call to Dr. Sharene Skeans Re: Elevated CPK  Phone Note Outgoing Call   Call placed by: Margarito Liner MD,  October 24, 2010 6:25 PM Call placed to: Dr. Sharene Skeans      Impression & Recommendations:  Problem # 1:  CPK, ABNORMAL (ICD-790.5) I called Dr. Sharene Skeans and discussed patient's chronically elevated CPK, including the question of whether a muscle biopsy is warranted.  Given the fact that he has no symptoms of myopathy, and given the results of his NCV/EMG study done at [facility] earlier this year, our consensus is that no further workup is needed at this time.  If symptoms develop, or the CPK levels increase significantly, then muscle biopsy can be reconsidered.

## 2010-12-15 NOTE — Progress Notes (Signed)
Summary: cramps/ hla  Phone Note Call from Patient   Summary of Call: pt's mom calls and states pt has been having leg cramps reaaly bad, up at night, walking floor and whimpering. this has been going on for appr 1-2 wks. nothing has changed except discontinuing the baclofen. please advise nicomas [phone]  Initial call taken by: Marin Roberts RN,  November 23, 2010 4:38 PM  Follow-up for Phone Call        I returned call but got a recording.  I would advise that he been seen in clinic ASAP. Follow-up by: Margarito Liner MD,  November 24, 2010 7:46 PM  Additional Follow-up for Phone Call Additional follow up Details #1::        i have spoken w/ dr Meredith Pel, pt, pt's father and mother, pt and mother state cramps possibly are better but to be on safe side an appt is booked per dr Meredith Pel for wed 1/18 w/ dr mills Additional Follow-up by: Marin Roberts RN,  November 25, 2010 5:51 PM

## 2010-12-21 NOTE — Progress Notes (Signed)
Summary: Lorazepam refill  Phone Note Refill Request   Refills Requested: Medication #1:  LORAZEPAM 2 MG TABS Take 3 tablets by mouth every evening or as directed by physician.   Last Refilled: 12/13/2010 Electronic refill request received.  Initial call taken by: Margarito Liner MD,  December 13, 2010 4:18 PM  Follow-up for Phone Call        Refill approved-nurse to complete; pharmacy may refill at appropriate interval no sooner than 1 month from last refill. Follow-up by: Margarito Liner MD,  December 13, 2010 4:19 PM  Additional Follow-up for Phone Call Additional follow up Details #1::        Rx called to pharmacy Additional Follow-up by: Merrie Roof RN,  December 17, 2010 10:25 AM    Prescriptions: LORAZEPAM 2 MG TABS (LORAZEPAM) Take 3 tablets by mouth every evening or as directed by physician.  #150 x 3   Entered and Authorized by:   Margarito Liner MD   Signed by:   Margarito Liner MD on 12/13/2010   Method used:   Telephoned to ...       [facility] [address] [id number]* (retail)       [address] [place], [place]  [address]       Ph: [phone]       Fax: [phone]   RxID:   [id number]

## 2011-01-15 ENCOUNTER — Inpatient Hospital Stay (INDEPENDENT_AMBULATORY_CARE_PROVIDER_SITE_OTHER)
Admission: RE | Admit: 2011-01-15 | Discharge: 2011-01-15 | Disposition: A | Discharge status: Home / Self Care | Payer: Medicare Other | Source: Ambulatory Visit | Attending: Family Medicine | Admitting: Family Medicine

## 2011-01-15 ENCOUNTER — Ambulatory Visit (INDEPENDENT_AMBULATORY_CARE_PROVIDER_SITE_OTHER): Payer: Medicare Other

## 2011-01-15 DIAGNOSIS — J069 Acute upper respiratory infection, unspecified: Secondary | ICD-10-CM

## 2011-01-22 ENCOUNTER — Other Ambulatory Visit: Payer: Self-pay | Admitting: Internal Medicine

## 2011-01-31 ENCOUNTER — Other Ambulatory Visit: Payer: Self-pay | Admitting: Internal Medicine

## 2011-01-31 DIAGNOSIS — M810 Age-related osteoporosis without current pathological fracture: Secondary | ICD-10-CM

## 2011-02-22 LAB — URINALYSIS, ROUTINE W REFLEX MICROSCOPIC
Bilirubin Urine: NEGATIVE
Glucose, UA: NEGATIVE mg/dL
Hgb urine dipstick: NEGATIVE
Ketones, ur: 15 mg/dL — AB
Nitrite: NEGATIVE
Protein, ur: NEGATIVE mg/dL
Specific Gravity, Urine: 1.025 (ref 1.005–1.030)
Urobilinogen, UA: 0.2 mg/dL (ref 0.0–1.0)
pH: 5.5 (ref 5.0–8.0)

## 2011-02-22 LAB — CBC
HCT: 46.9 % (ref 39.0–52.0)
Hemoglobin: 15.9 g/dL (ref 13.0–17.0)
MCHC: 33.8 g/dL (ref 30.0–36.0)
MCV: 94.3 fL (ref 78.0–100.0)
RBC: 4.98 MIL/uL (ref 4.22–5.81)

## 2011-02-22 LAB — BASIC METABOLIC PANEL
CO2: 30 mEq/L (ref 19–32)
Chloride: 107 mEq/L (ref 96–112)
GFR calc Af Amer: >60 mL/min (ref >60)
Sodium: 145 mEq/L (ref 135–145)

## 2011-02-22 LAB — DIFFERENTIAL
Basophils Absolute: 0.1 10*3/uL (ref 0.0–0.1)
Basophils Relative: 1 % (ref 0–1)
Eosinophils Absolute: 0.1 10*3/uL (ref 0.0–0.7)
Eosinophils Relative: 0 % (ref 0–5)
Lymphocytes Relative: 35 % (ref 12–46)
Lymphs Abs: 4.4 10*3/uL — ABNORMAL HIGH (ref 0.7–4.0)
Monocytes Absolute: 1.4 10*3/uL — ABNORMAL HIGH (ref 0.1–1.0)
Monocytes Relative: 11 % (ref 3–12)
Neutro Abs: 6.6 10*3/uL (ref 1.7–7.7)
Neutrophils Relative %: 53 % (ref 43–77)

## 2011-03-31 NOTE — Procedures (Signed)
.     CLINICAL INFORMATION:  This patient has a history of inoperable brain cancer  in 1997.  It was on the right side but affected the left side.  T has had  jerking episodes during sleep at night.   MEDICATIONS:  1. DDAVP.  2. Nasal spray.  3. Cortisone.  4. Synthroid.  5. Other medications.  No anticonvulsants are elicited.   TECHNICAL DESCRIPTION:  This EEG was recorded during the awake state without  any evidence of stage II sleep noted.  The background activity shows 12 to  14 Hz rhythms with higher amplitude seen in the posterior head regions  bilaterally.  Intermittently during this EEG, there was some mild left  frontal cental slowing present.  It was noted on multiple occasions that  sharp wave activity and spike activity with phase reversals in left central  region were present.  There was no associated electrographic seizure.  No  other definite abnormalities were noted.   IMPRESSION:  This is an abnormal EEG showing left central sharp waves and  spikes indicative of a lower threshold for seizures.  There is also some  suspected mild left frontotemporal slowing noted.  No other evidence of an  abnormality is present.  These findings should be taken to account in  clinical study of the patient.    Evie Lacks, M.D.   AOZ:HYQM  D:  02/16/2004 13:45:23  T:  02/16/2004 14:33:57  Job #:  [id number]

## 2011-03-31 NOTE — Discharge Summary (Signed)
NAME:  Nicholas Holt, Nicholas Holt NO.:  [id number]   MEDICAL RECORD NO.:  [id number]          PATIENT TYPE:  INP   LOCATION:  4709                         FACILITY:  [facility]   PHYSICIAN:  Alvester Morin, M.D.  DATE OF BIRTH:  1980/12/08   DATE OF ADMISSION:  08/07/2008  DATE OF DISCHARGE:  08/08/2008                               DISCHARGE SUMMARY   DISCHARGE DIAGNOSES:  1. Pneumonia of the left lower lobe  2. Tachycardia.  3. History of malignant brain neoplasm resulting in:      a.     Cognitive impairment.      b.     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      c.     seizure disorder.  4. Panhypopituitarism from tachycardia to include:      a.     Diabetes insipidus.  5. Hypogonadism.  6. Secondary hyperthyroidism.  7. Fever.  8. Osteoporosis.   DISCHARGE MEDICATIONS:  1. Testosterone 200 mg intramuscularly every 3 weeks.  2. DDAVP 0.01% solution 10 mcg spray into one nostril 4 times a day  3. Multivitamin.  4. Prilosec 20 mg p.o. q.a.m.  5. Synthroid 125 mcg p.o. daily  6. Vitamin C 2 tablets p.o. daily.  7. Cod liver oil daily.  8. Vitamin B complex daily.  9. Calcium, magnesium, and zinc tablets p.o. daily.  10.Baclofen 10 mg 1 tab p.o. q.a.m. and p.m. and 1/2 pound nightly.  11.Hydrocortisone 40 mg in the morning, 50 mg at noon and taper by 5      mg each dose every 3 days until resuming home dose.  12.Potassium chloride 10 mEq t.i.d. with meals.  13.Fosamax 70 mg p.o. q. weekly.  14.Lorazepam 2 mg tabs 3 tablets p.o. nightly.  15.Azithromycin 250 mg p.o. daily for 6 days.   DISPOSITION AND FOLLOW UP:  The patient is to follow up in the  outpatient clinic 2 weeks after discharge.  At that time, the patient  needs to be evaluated for fever and respiratory symptoms.   CONSULTATIONS:  No consultations were obtained during this  hospitalization.   PROCEDURES:  No procedures were performed during this hospitalization.   ADMISSION HISTORY AND PHYSICAL:   The patient is a 30 year old male with  a complex past medical history to include malignant brain tumor and  multiple resulting comorbidities, who presented to the outpatient clinic  with his mother at the day of admission, who had noted a week-long  history of fevers up to 100.9 at home.  He also had rhinorrhea,  nonproductive cough, and 1-2 episodes of diarrhea for the last 4-5 days  prior to admission.  He had no known sick contacts.  He traveled  recently to [facility] and these symptoms started while he was there.  She had decided to take him to an urgent care emergency there, but was  concerned about the long wait and sick contacts, so decided to wait  until she could see his primary care [name].  She notes he has  occasionally had fevers at home, but they usually resolve with Tylenol  and these fevers did not, she was very concerned.  He denies myalgias,  headache, dizziness, chest pain, or urinary symptoms.   ADMISSION PHYSICAL EXAMINATION:  VITAL SIGNS:  Temperature 100.9, blood  pressure 134/89, heart rate 123 up to 135 in clinic, respiratory rate  16, and O2 sat 95% on room air.  GENERAL:  He is alert, pleasant, in no distress.  EYE:  Pupils equally round and reactive to light.  Extraocular motions  are intact.  Anicteric.  ENT:  Moist mucous membranes.  No oropharyngeal exudates.  NECK:  Supple.  No lymphadenopathy.  RESPIRATORY:  Decreased air movement in bilateral bases with normal  respiratory effort.  CARDIOVASCULAR:  Tachycardic with regular rhythm.  No murmurs or rubs.  ABDOMEN:  Good bowel sounds, soft, nontender, and nondistended.  EXTREMITIES:  No edema.  No calf tenderness.  SKIN:  Warm, dry, no rashes.  LYMPH:  No cervical lymphadenopathy.  MUSCULOSKELETAL:  Decreased range of motion in left upper extremity.  NEURO:  Cranial nerves II-XII intact, chronic deficits noted in left  upper extremity, no cerebellar signs.   ADMISSION LABORATORY DATA:  White  blood cell count 8.2 with an absolute  neutrophil count of 7.0, hemoglobin 16.4, MCV of 93, platelet count 172,  and sed rate 0.  Sodium 145, potassium 3.9, chloride 107, bicarb 29, BUN  15, creatinine 1.46, glucose 110, total bilirubin 1.4, alkaline  phosphatase 42, AST 28, ALT 34, total protein 7.7, albumin 4.5, and  calcium 9.5.  Free T4 of 1.55.  HIV nonreactive.  Urinalysis:  Specific  gravity 1.031, small bilirubin, trace ketones, 30 protein, trace  leukocyte esterase, rare bacteria, rare squamous epithelial cells, and 0-  2 wbc's.  Chest x-ray consistent with questionable left lower lobe  infiltrate.  Blood cultures x2 negative to date.   HOSPITAL COURSE:  1. Pneumonia.  The patient had a fever noted in the 100.9 in the      clinic and his mom noted that he had been coughing quite      frequently.  He was unable to provide sputum, so sputum Gram stain      culture was not done.  He was started on Rocephin and azithromycin      and defervesced and did not have a fever during this      hospitalization.  He had a normal white count that was left shifted      with a questionable chest infiltrate on chest x-ray.  It was felt      like he likely had pneumonia and was treated with Rocephin and      azithromycin and then converted over to azithromycin along with      complete a 7-day course of this medication.  Urine for strep and      Legionella were both tested and were negative.  Blood cultures were      negative to date.  2. Tachycardia.  The patient had several heart rates documented in the      120s-130s range.  He was admitted and received IV fluids and his      heart rate returned to the normal range very quickly.  It was felt      like tachycardia was either secondary to mild volume depletion      and/or fever.  He had blood cultures x2 that were negative to date.  3. History of brain malignancy with resulting panhypopituitarism.  He      was continued on all of  his home meds.   His dose of hydrocortisone      was increased to stress dose and he did not have any problems      during this hospitalization.   DISCHARGE VITAL SIGNS:  Temperature 97.3, blood pressure 107/70, heart  rate 84, respiratory rate 20, and O2 sat 99% on room air.   DISCHARGE LABORATORY DATA:  White blood cell count 6.7, hemoglobin 14.1,  MCV 94, and platelet count 147.  Sodium 136, potassium 3.8, chloride  103, bicarb 23, BUN 14, creatinine 1.05, glucose 117, total bilirubin  0.9, alkaline phosphatase 42, AST 20, ALT 24, total protein 5.9, albumin  3.3, and calcium 8.0.  Strep pneumoniae urinary antigen negative and  urine Legionella negative.      Joaquin Courts, MD  Electronically Signed      Alvester Morin, M.D.  Electronically Signed    VW/MEDQ  D:  08/12/2008  T:  08/13/2008  Job:  [id number]

## 2011-04-11 ENCOUNTER — Other Ambulatory Visit (INDEPENDENT_AMBULATORY_CARE_PROVIDER_SITE_OTHER): Payer: Medicare Other | Admitting: Internal Medicine

## 2011-04-11 DIAGNOSIS — K219 Gastro-esophageal reflux disease without esophagitis: Secondary | ICD-10-CM

## 2011-04-19 ENCOUNTER — Encounter: Payer: Self-pay | Admitting: Internal Medicine

## 2011-05-29 ENCOUNTER — Telehealth: Payer: Self-pay | Admitting: *Deleted

## 2011-05-29 ENCOUNTER — Encounter: Payer: Self-pay | Admitting: Internal Medicine

## 2011-05-29 NOTE — Telephone Encounter (Signed)
Pt's mother calls and states pt is starting to have the bad cramps at night again. She requests an appt w/ dr Meredith Pel and states she had called earlier and was told dr Meredith Pel had nothing until the end of aug. At her request i spoke w/ dr Meredith Pel, he would like pt seen 7/17 and i will arrange this in am, i called mother back and ask about this, she states wed might be better but i have encouraged tues she ask that i call back in so she could speak w/ pt's father,  She states he will bring pt any time that it can be scheduled, will call mr Poon in am at [phone]. Mrs Avis [phone]. She is ask that if pt is crying w/ cramps to please take him to ED and she is agreeable

## 2011-05-30 ENCOUNTER — Ambulatory Visit (INDEPENDENT_AMBULATORY_CARE_PROVIDER_SITE_OTHER): Payer: Medicare Other | Admitting: Internal Medicine

## 2011-05-30 VITALS — BP 130/81 | HR 78 | Temp 97.2°F | Ht 68.0 in | Wt 227.0 lb

## 2011-05-30 DIAGNOSIS — R252 Cramp and spasm: Secondary | ICD-10-CM

## 2011-05-30 MED ORDER — CYCLOBENZAPRINE HCL 5 MG PO TABS: ORAL_TABLET | ORAL | Status: DC

## 2011-05-30 NOTE — Assessment & Plan Note (Signed)
Recurrent left leg cramping.  Likely secondary to muscle spasm. Patient was placed on Effexor in the past which worked well for him. -Will prescribe flexural 5 mg by mouth each bedtime, patient may take 2 tablet each bedtime if his symptoms are not improving. -I also advised the patient's father is to bring patient back for further evaluation if he continues to have muscle cramping

## 2011-05-30 NOTE — Telephone Encounter (Signed)
Agree with plan for patient to be seen in [facility]; also, please schedule an appointment in my clinic when available in August.

## 2011-05-30 NOTE — Patient Instructions (Signed)
Start taking Flexeril 5mg  one tablet at night.  If you continue to have muscle cramp in your leg, you can take 2 tablets at bedtime. Follow up as needed

## 2011-05-30 NOTE — Progress Notes (Signed)
History of present illness: 30 year old man with past medical history of panhypopituitarism presents today for leg cramping. He reports have been left-sided leg cramping in the past 3 days which only occurs at night. He is unable to sleep at night secondary to the leg cramping. He had leg cramping about 6 months ago and was placed on flexeril.  His cramping resolved but it started to come back recently. Patient and his father are not sure if patient has been taking Flexeril since his mother is the one who prepare his medication at home area and he denies any fever nausea vomiting abdominal pain, shortness of breath, chest pain, or any other systemic symptoms. He has no other symptoms and reports feeling well is otherwise.  ROS: As per history of present illness  Physical examination: General: alert, well-developed, and cooperative to examination.   Lungs: normal respiratory effort, no accessory muscle use, normal breath sounds, no crackles, and no wheezes. Heart: normal rate, regular rhythm, no murmur, no gallop, and no rub.  Abdomen: soft, non-tender, normal bowel sounds, no distention, no guarding, no rebound tenderness Msk: no joint swelling, no joint warmth, and no redness over joints.  Pulses: 2+ DP/PT pulses bilaterally Extremities: No cyanosis, clubbing, edema, no lower extremity calf tenderness, erythema, drainage  Neurologic: alert & oriented X3, cranial nerves II-XII intact, strength normal in all extremities, sensation intact to light touch, and gait normal.

## 2011-05-30 NOTE — Telephone Encounter (Signed)
appt is made for 1515 today

## 2011-06-01 ENCOUNTER — Encounter: Payer: Medicare Other | Admitting: Internal Medicine

## 2011-06-16 ENCOUNTER — Other Ambulatory Visit: Payer: Self-pay | Admitting: Internal Medicine

## 2011-06-16 NOTE — Telephone Encounter (Signed)
Refill called to [facility] 

## 2011-06-16 NOTE — Telephone Encounter (Signed)
Refill approved - nurse to complete. 

## 2011-06-21 ENCOUNTER — Other Ambulatory Visit: Payer: Self-pay | Admitting: Internal Medicine

## 2011-08-05 ENCOUNTER — Other Ambulatory Visit: Payer: Self-pay | Admitting: Internal Medicine

## 2011-08-07 ENCOUNTER — Encounter: Payer: Self-pay | Admitting: Internal Medicine

## 2011-08-07 ENCOUNTER — Ambulatory Visit (INDEPENDENT_AMBULATORY_CARE_PROVIDER_SITE_OTHER): Payer: Medicare Other | Admitting: Internal Medicine

## 2011-08-07 VITALS — BP 117/74 | HR 79 | Temp 95.3°F | Ht 69.0 in | Wt 236.2 lb

## 2011-08-07 DIAGNOSIS — T68XXXA Hypothermia, initial encounter: Secondary | ICD-10-CM | POA: Insufficient documentation

## 2011-08-07 DIAGNOSIS — E039 Hypothyroidism, unspecified: Secondary | ICD-10-CM

## 2011-08-07 LAB — URINALYSIS, ROUTINE W REFLEX MICROSCOPIC
Bilirubin Urine: NEGATIVE
Leukocytes, UA: NEGATIVE
Nitrite: NEGATIVE
Protein, ur: NEGATIVE mg/dL
Specific Gravity, Urine: 1.021 (ref 1.005–1.030)
Urobilinogen, UA: 0.2 mg/dL (ref 0.0–1.0)

## 2011-08-07 LAB — T4, FREE: Free T4: 1.32 ng/dL (ref 0.80–1.80)

## 2011-08-07 LAB — CBC
Hemoglobin: 14.8 g/dL (ref 13.0–17.0)
MCH: 31.5 pg (ref 26.0–34.0)
RBC: 4.7 MIL/uL (ref 4.22–5.81)
WBC: 6.7 10*3/uL (ref 4.0–10.5)

## 2011-08-07 LAB — COMPREHENSIVE METABOLIC PANEL
Albumin: 4.3 g/dL (ref 3.5–5.2)
Alkaline Phosphatase: 43 U/L (ref 39–117)
BUN: 14 mg/dL (ref 6–23)
CO2: 25 mEq/L (ref 19–32)
Glucose, Bld: 87 mg/dL (ref 70–99)
Potassium: 4.2 mEq/L (ref 3.5–5.3)
Total Bilirubin: 0.4 mg/dL (ref 0.3–1.2)

## 2011-08-07 LAB — POCT I-STAT, CHEM 8
Calcium, Ion: 1.3
HCT: 52
Hemoglobin: 17.7 — ABNORMAL HIGH
Sodium: 140
TCO2: 28

## 2011-08-07 LAB — MAGNESIUM: Magnesium: 2.4

## 2011-08-07 NOTE — Patient Instructions (Addendum)
   Please follow-up at the clinic in 3 days (on Thursday, August 10, 2011) at which time we will reevaluate your temperature.  Please double your hydrocortisone dosage ONLY on today (Monday), Tuesday, Wednesday (09/24-09/26) (doubled dose = 4 in morning, 2 in afternoon).  Go back to his regular dosage (2 in morning and 1 in afternoon) on Thursday, and return to clinic on Thursday (09/27).  Please follow-up in the clinic sooner if needed.  Please follow-up with Dr. Sharl Ma within 1 month.   If symptoms worsen, or new symptoms arise, please call the clinic or go to the ER.  If you have been started on new medication(s), and you develop symptoms concerning for allergic reaction, including, but not limited to, throat closing, tongue swelling, rash, please stop the medication immediately and call the clinic at [phone], and go to the ER.  Please bring all of your medications in a bag to your next visit.

## 2011-08-07 NOTE — Assessment & Plan Note (Signed)
The cause of the patient's mild hypothermia is unclear at this time. Differentials include equipment failure of his thermometer, hypoglycemia, hypothyroidism, hypopituitarism, adrenal insufficiency. Per last labs in May 20 12th with Dr. Sharl Ma, it seemed that his thyroid function, pituitary hormone levels were adequately controlled. There is no evidence of infection at this time as the patient is completely asymptomatic. This case and plan of care was discussed with Dr. Margarito Liner. - Will check a comprehensive metabolic panel to evaluate for electrolyte disturbances. - Will check CBC to evaluate for indication of infection. - Will check  urinalysis to evaluate for evidence of urinary tract infection as source of infection. - Will check testosterone levels. - Will check free T4 to evaluate thyroid function. - Patient is recommended to double his dose of hydrocortisone on Monday, Tuesday, Wednesday (9/24 through 08/09/2011). He is to resume his regular hydrocortisone dosage starting on Thursday (08/10/2011). - Patient recommended to followup in clinic on Thursday, 08/10/2011. - Recommended to followup with Dr. Sharl Ma within one month, lab results and clinic note will be forwarded to Dr. Sharl Ma.

## 2011-08-07 NOTE — Progress Notes (Signed)
Subjective:    Patient ID: Nicholas Holt, male    DOB: Sep 21, 1981, 30 y.o.   MRN: [id number]  HPI Pt is a 30 y.o. male who has a past medical history of Seizure disorder; panhypopituitarism; Hypothyroidism; history of Neoplasm of bone, malignant; Diabetes insipidus; Hemiparesis, left and presents to clinic today accompanied by his father for the following:    1) Mild hypothermia - patient's father indicates that last night, the patient had an oral temperature of 92.33F, which alarmed the family and precipitated a visit to ER (they subsequently left before evaluation due to long ER wait-time. Temperature was taken routinely, as pt previously had unexplained fevers years ago. Patient and father deny associated nausea, vomiting, abdominal pain, cough, sore throat, ear pain, muscle cramps, jitteriness, diaphoresis, fatigue, malaise. Denies recent new medications or changes to medication dosages. Denies recent alcohol or illicit drug consumption. Denies vision change, new unilateral weakness (beyond baseline), new speech difficulties. Denies recent head trauma or headaches.  Of note, the patient was last seen by Dr. Durene Fruits endocrinology, in May 2012. At that time he was recommended to continue his DDAVP, hydrocortisone, testosterone supplementation at current dosages. Patient indicates that he's been taking all of his medications regularly and as prescribed. He denies recent illness, therefore he is not needed to double his hydrocortisone therapy.   Review of Systems Per HPI.  Current Outpatient Medications Medication Sig  . albuterol (PROAIR HFA) 108 (90 BASE) MCG/ACT inhaler Inhale 1-2 puffs into the lungs every 6 (six) hours as needed.    Marland Kitchen alendronate (FOSAMAX) 70 MG tablet Take 1 tablet (70 mg total) by mouth once a week. Do not lie down, eat, or take other medications for at least 30 minutes afterward.  . Ascorbic Acid (VITAMIN C) 1000 MG tablet Take 1,000 mg by mouth daily.    . B  Complex Vitamins (VITAMIN B COMPLEX) CAPS Take by mouth. Take 1 capsule by mouth once a day   . Calcium-Magnesium-Zinc 1000-400-15 MG TABS Take by mouth. Take two tablets by mouth once a day   . Cod Liver Oil CAPS Take by mouth. Take 1 capsule by mouth once a day   . cyclobenzaprine (FLEXERIL) 5 MG tablet Take 1 tab by mouth at bedtime.  May take 1 tab three times a day as needed for muscle cramps  . desmopressin (DDAVP) 0.01 % solution Place 1 spray into the nose. Spray 10 micrograms into one nostril 4 times a day   . fluticasone (FLONASE) 50 MCG/ACT nasal spray Place 2 sprays into the nose at bedtime.    . hydrocortisone (CORTEF) 10 MG tablet TAKE 2 TABLETS BY MOUTH EVERY MORNING AND 1 TABLET EACH AFTERNOON (DOUBLE THE DOSE ON SICK DAYS  . levothyroxine (SYNTHROID, LEVOTHROID) 125 MCG tablet Take 1 tablet (125 mcg total) by mouth daily.  Marland Kitchen LORazepam (ATIVAN) 2 MG tablet Take 3 tablets by mouth daily in the evening or as directed by physician  . Multiple Vitamin (MULTIVITAMIN) tablet Take 1 tablet by mouth daily.    . pantoprazole (PROTONIX) 20 MG tablet Take 1 tablet (20 mg total) by mouth daily.  Marland Kitchen testosterone (ANDROGEL) 50 MG/5GM GEL Place 5 g onto the skin daily.      Allergies Review of patient's allergies indicates no known allergies.  Past Medical History  Diagnosis Date  . Leg cramps   . Seizure disorder   . Hypopituitarism   . Hypothyroidism   . Neoplasm of bone, malignant     h/o  .  Diabetes insipidus   . Hemiparesis, left   . History of attention deficit disorder   . Hypercholesterolemia   . Osteoporosis        Objective:   Physical Exam   Filed Vitals:   08/07/11 0819  BP: 117/74  Pulse: 79  Temp: 95.3 F (35.2 C)    Repeat oral temperature:  96.2 F Repeat axillary temperature:  96.1 F  General: Vital signs reviewed and noted. Well-developed, well-nourished, in no acute distress; alert, appropriate and cooperative throughout examination.  Head:  Normocephalic, atraumatic.  Eyes: conjunctivae/corneas clear. PERRL, EOM's intact. Fundi benign.  Ears: TM nonerythematous, not bulging, good light reflex bilaterally.  Nose: Mucous membranes moist, not inflammed, nonerythematous.  Throat: Oropharynx nonerythematous, no exudate appreciated.   Lungs:  Normal respiratory effort. Clear to auscultation BL without crackles or wheezes.  Heart: RRR. S1 and S2 normal without gallop, murmur, or rubs.  Abdomen:  BS normoactive. Soft, Nondistended, non-tender.  No masses or organomegaly.  Extremities: No pretibial edema.       Assessment & Plan:  Case and plan of care discussed with Dr. Margarito Liner.

## 2011-08-10 ENCOUNTER — Encounter: Payer: Self-pay | Admitting: Internal Medicine

## 2011-08-10 ENCOUNTER — Ambulatory Visit (INDEPENDENT_AMBULATORY_CARE_PROVIDER_SITE_OTHER): Payer: Medicare Other | Admitting: Internal Medicine

## 2011-08-10 VITALS — BP 121/75 | HR 91 | Temp 97.4°F | Ht 69.0 in | Wt 236.0 lb

## 2011-08-10 DIAGNOSIS — T68XXXA Hypothermia, initial encounter: Secondary | ICD-10-CM

## 2011-08-10 DIAGNOSIS — Z23 Encounter for immunization: Secondary | ICD-10-CM

## 2011-08-10 NOTE — Patient Instructions (Signed)
Please make an appointment in 4-6 weeks with Dr. Meredith Pel or at an early as possible appointment with him. Start taking hydrocortisone at regular dose from today. All the lab tests done are normal. Please keep on taking all the medications regularly.

## 2011-08-10 NOTE — Assessment & Plan Note (Addendum)
Hypothymia resolved. Temperature normal today and patient has no symptoms. Lab tests normal done during last visit.  CBC CMP, testosterone and free T4 levels and a UA. Advised to followup with Dr. Meredith Pel in 4-6 weeks. Start taking hydrocortisone at regular dose from today. I will give flu shot and tetanus shot today.

## 2011-08-10 NOTE — Progress Notes (Signed)
  Subjective:    Patient ID: Nicholas Holt, male    DOB: April 27, 1981, 30 y.o.   MRN: [id number]  HPI Nicholas Holt is a pleasant 30 year old man with past medical history of hypopituitarism, seizure disorder who comes to the clinic for a followup visit for hypothermia. He was seen by Dr. Thad Ranger on 08/07/2011 for hypothermia. Patient went to the ER because his temperature was 92.4 at home. And his temperature in the clinic was 95.3.  His dose of hydrocortisone was increased for 3 days and was advised to follow up today. She sees Dr. Sharl Ma for his hypopituitarism. He is on hormone replacement therapy for it. His CBC, CMP, testosterone level, free T4 levels checked during last visit are within normal limits. I discussed this with patient and his father who accompanies him. History today is 97.4 and he denies any symptoms of sweating, chills, cough, headache, chest pain, short of breath, abdominal pain, nausea vomiting, diarrhea.   Review of Systems    as per history of present illness, all other systems reviewed and negative. Objective:   Physical Exam  Vitals: Reviewed and stable. General: NAD HEENT: PERRL, EOMI, no scleral icterus Cardiac: RRR, no rubs, murmurs or gallops Pulm: clear to auscultation bilaterally, moving normal volumes of air Abd: soft, nontender, nondistended, BS present Ext: warm and well perfused, no pedal edema Neuro: alert and oriented X3, cranial nerves II-XII grossly intact, strength and sensation to light touch equal in bilateral upper and lower extremities       Assessment & Plan:

## 2011-08-14 LAB — COMPREHENSIVE METABOLIC PANEL
AST: 34
Albumin: 3.3 — ABNORMAL LOW
Alkaline Phosphatase: 32 — ABNORMAL LOW
BUN: 14
CO2: 28
Calcium: 9.5
Creatinine, Ser: 1.05
Creatinine, Ser: 1.21
GFR calc Af Amer: >60
GFR calc non Af Amer: >60
Glucose, Bld: 117 — ABNORMAL HIGH
Potassium: 3.8
Sodium: 139
Total Bilirubin: 0.9
Total Protein: 5.9 — ABNORMAL LOW
Total Protein: 7.8

## 2011-08-14 LAB — LEGIONELLA ANTIGEN, URINE

## 2011-08-14 LAB — CBC
HCT: 42.4
Hemoglobin: 14.1
MCHC: 33.3
MCV: 93.8
MCV: 93.7
Platelets: 147 — ABNORMAL LOW
RBC: 4.99
RDW: 14.5
RDW: 15.4

## 2011-08-14 LAB — POCT URINALYSIS DIP (DEVICE)
Hgb urine dipstick: NEGATIVE
Nitrite: NEGATIVE
Protein, ur: 30 — AB
Specific Gravity, Urine: 1.015
Urobilinogen, UA: 0.2
pH: 8.5 — ABNORMAL HIGH

## 2011-08-14 LAB — DIFFERENTIAL
Basophils Absolute: 0
Basophils Relative: 0
Eosinophils Relative: 1
Lymphocytes Relative: 8 — ABNORMAL LOW
Lymphocytes Relative: 27
Lymphs Abs: 2.3
Monocytes Absolute: 0.3
Monocytes Relative: 4
Monocytes Relative: 6
Neutro Abs: 5.9
Neutrophils Relative %: 88 — ABNORMAL HIGH
Neutrophils Relative %: 66

## 2011-08-14 LAB — LACTIC ACID, PLASMA: Lactic Acid, Venous: 1.1

## 2011-08-18 LAB — BASIC METABOLIC PANEL
CO2: 21
Chloride: 109
GFR calc Af Amer: >60
Potassium: 3.3 — ABNORMAL LOW

## 2011-08-30 ENCOUNTER — Other Ambulatory Visit: Payer: Self-pay | Admitting: Internal Medicine

## 2011-09-30 ENCOUNTER — Encounter: Payer: Self-pay | Admitting: Internal Medicine

## 2011-09-30 NOTE — Progress Notes (Signed)
I updated the patient's medication list based on an office note from his endocrinologist Dr. Talmage Coin dated 09/25/2011.    The changes included:  1. Stopped levothyroxine 125 mcg and started levothyroxine 112 mcg one tablet daily. 2. Decreased hydrocortisone 10 mg to a dose of 1-1/2 tablets each morning and one tablet each afternoon (double the dose on 6 days).

## 2011-10-26 ENCOUNTER — Other Ambulatory Visit: Payer: Self-pay | Admitting: Internal Medicine

## 2011-10-27 NOTE — Telephone Encounter (Signed)
Lorazepam rx called to [facility] pharmacy. 

## 2011-10-27 NOTE — Telephone Encounter (Signed)
Refill approved - nurse to complete. 

## 2011-12-07 DIAGNOSIS — E230 Hypopituitarism: Secondary | ICD-10-CM | POA: Diagnosis not present

## 2011-12-14 DIAGNOSIS — E230 Hypopituitarism: Secondary | ICD-10-CM | POA: Diagnosis not present

## 2011-12-14 DIAGNOSIS — E232 Diabetes insipidus: Secondary | ICD-10-CM | POA: Diagnosis not present

## 2011-12-14 DIAGNOSIS — Z79899 Other long term (current) drug therapy: Secondary | ICD-10-CM | POA: Diagnosis not present

## 2011-12-14 DIAGNOSIS — E669 Obesity, unspecified: Secondary | ICD-10-CM | POA: Diagnosis not present

## 2012-01-25 ENCOUNTER — Other Ambulatory Visit: Payer: Self-pay | Admitting: Internal Medicine

## 2012-02-19 ENCOUNTER — Other Ambulatory Visit: Payer: Self-pay | Admitting: *Deleted

## 2012-02-26 ENCOUNTER — Ambulatory Visit (INDEPENDENT_AMBULATORY_CARE_PROVIDER_SITE_OTHER): Payer: Medicare Other | Admitting: Internal Medicine

## 2012-02-26 ENCOUNTER — Encounter: Payer: Self-pay | Admitting: Internal Medicine

## 2012-02-26 VITALS — BP 130/81 | HR 98 | Temp 99.0°F | Wt 220.0 lb

## 2012-02-26 DIAGNOSIS — R252 Cramp and spasm: Secondary | ICD-10-CM

## 2012-02-26 DIAGNOSIS — F909 Attention-deficit hyperactivity disorder, unspecified type: Secondary | ICD-10-CM | POA: Diagnosis not present

## 2012-02-26 MED ORDER — POTASSIUM CHLORIDE ER 10 MEQ PO TBCR: EXTENDED_RELEASE_TABLET | ORAL | Status: DC

## 2012-02-26 MED ORDER — AMPHETAMINE-DEXTROAMPHETAMINE 10 MG PO TABS: 10.0000 mg | ORAL_TABLET | Freq: Every day | ORAL | Status: AC

## 2012-02-26 MED ORDER — POTASSIUM CHLORIDE ER 10 MEQ PO TBCR: 10.0000 meq | EXTENDED_RELEASE_TABLET | Freq: Two times a day (BID) | ORAL | Status: DC

## 2012-02-26 NOTE — Patient Instructions (Signed)
Please, call with any question And follow up on as needed basis.

## 2012-02-26 NOTE — Progress Notes (Signed)
Patient ID: Nicholas Holt, male   DOB: 1981-01-12, 31 y.o.   MRN: [id number] HPI:    1. Leg cramps. Hx of chronic problem and been treated with K-dur "for many years." Patient denies any bone pain, seizures, or muscle twitching anywhere else. 2. Hx of ADHD -Rf on Adderal. Review of Systems: Negative except per history of present illness  Physical Exam:  Nursing notes and vitals reviewed General:  alert, well-developed, and cooperative to examination.   Lungs:  normal respiratory effort, no accessory muscle use, normal breath sounds, no crackles, and no wheezes. Heart:  normal rate, regular rhythm, no murmurs, no gallop, and no rub.   Abdomen:  soft, non-tender, normal bowel sounds, no distention, no guarding, no rebound tenderness, no hepatomegaly, and no splenomegaly.   Extremities:  No cyanosis, clubbing, edema Neurologic:  alert & oriented X3, nonfocal exam  Meds: Medications Prior to Admission  Medication Sig Dispense Refill  . albuterol (PROAIR HFA) 108 (90 BASE) MCG/ACT inhaler Inhale 1-2 puffs into the lungs every 6 (six) hours as needed.        Marland Kitchen alendronate (FOSAMAX) 70 MG tablet Take 1 tablet (70 mg total) by mouth once a week. Do not lie down, eat, or take other medications for at least 30 minutes afterwards.  12 tablet  3  . Ascorbic Acid (VITAMIN C) 1000 MG tablet Take 1,000 mg by mouth daily.        . B Complex Vitamins (VITAMIN B COMPLEX) CAPS Take by mouth. Take 1 capsule by mouth once a day       . Calcium-Magnesium-Zinc 1000-400-15 MG TABS Take by mouth. Take two tablets by mouth once a day       . Cod Liver Oil CAPS Take by mouth. Take 1 capsule by mouth once a day       . cyclobenzaprine (FLEXERIL) 5 MG tablet Take 1 tablet (5 mg total) by mouth 3 (three) times daily as needed for muscle spasms.  30 tablet  0  . desmopressin (DDAVP) 0.01 % solution Place 1 spray into the nose. Spray 10 micrograms into one nostril 4 times a day       . fluticasone (FLONASE) 50 MCG/ACT  nasal spray Place 2 sprays into the nose at bedtime.        . hydrocortisone (CORTEF) 10 MG tablet Take one and one-half tablets each morning and one tablet each afternoon (double the dose on sick days).      Marland Kitchen levothyroxine (SYNTHROID, LEVOTHROID) 112 MCG tablet Take 112 mcg by mouth daily.        Marland Kitchen LORazepam (ATIVAN) 2 MG tablet Take three tablets by mouth each evening or as directed by physician.  150 tablet  3  . Multiple Vitamin (MULTIVITAMIN) tablet Take 1 tablet by mouth daily.        . pantoprazole (PROTONIX) 20 MG tablet Take 1 tablet (20 mg total) by mouth daily.  30 tablet  11  . testosterone (ANDROGEL) 50 MG/5GM GEL Place 5 g onto the skin daily.         No current facility-administered medications on file as of 02/26/2012.    Allergies: Review of patient's allergies indicates no known allergies. Past Medical History  Diagnosis Date  . Leg cramps   . Seizure disorder   . Hypopituitarism   . Hypothyroidism   . Neoplasm of bone, malignant     h/o  . Diabetes insipidus   . Hemiparesis, left   . History of  attention deficit disorder   . Hypercholesterolemia   . Osteoporosis    No past surgical history on file. Family History  Problem Relation Age of Onset  . Hypertension Mother   . Cancer Father   . Hypertension Father    History   Social History  . Marital Status: Single    Spouse Name: N/A    Number of Children: N/A  . Years of Education: N/A   Occupational History  . Not on file.   Social History Main Topics  . Smoking status: Never Smoker   . Smokeless tobacco: Not on file  . Alcohol Use: Not on file  . Drug Use: Not on file  . Sexually Active: Not on file   Other Topics Concern  . Not on file   Social History Narrative   Lives with parents No living siblings, stepsister has type 1 DM, died in early 82's. His mother works 2 jobs and his father stays at home with him.  Father takes him to his appointments.  Mother always sends a note for Dr. Meredith Pel on  appointments    A/P: [name] is a 31 y/o AA man with multiple medical problems including hypopituitarism (surgical), secondary hypothyroidism, diabetes insipidus and seizure disorder, presents with: 1. Leg cramps. Per mother, long-term history of leg cramps with a negative work up in the past at Dr. [facility] (Neuro). Reports taking Potassium supplementation which controls his Sx. B-mets over the past 2 years were reviewed and with K levels within normal range. Patient is also on a magnesium therapy. -RF on K-tabs 10 meq PO tid -continue with current regimen per Dr. Sharl Ma (Endocrine).  2. ADHD -refill on Adderal.

## 2012-03-01 ENCOUNTER — Other Ambulatory Visit: Payer: Self-pay | Admitting: Internal Medicine

## 2012-03-04 ENCOUNTER — Other Ambulatory Visit: Payer: Self-pay | Admitting: Internal Medicine

## 2012-03-04 NOTE — Telephone Encounter (Signed)
Lorazepam rx  Called to [facility].

## 2012-03-20 ENCOUNTER — Encounter: Payer: Medicare Other | Admitting: Internal Medicine

## 2012-03-30 ENCOUNTER — Other Ambulatory Visit: Payer: Self-pay | Admitting: Internal Medicine

## 2012-04-10 ENCOUNTER — Other Ambulatory Visit: Payer: Self-pay | Admitting: Internal Medicine

## 2012-04-10 DIAGNOSIS — E669 Obesity, unspecified: Secondary | ICD-10-CM | POA: Diagnosis not present

## 2012-04-10 DIAGNOSIS — Z79899 Other long term (current) drug therapy: Secondary | ICD-10-CM | POA: Diagnosis not present

## 2012-04-10 DIAGNOSIS — E230 Hypopituitarism: Secondary | ICD-10-CM | POA: Diagnosis not present

## 2012-04-10 DIAGNOSIS — E232 Diabetes insipidus: Secondary | ICD-10-CM | POA: Diagnosis not present

## 2012-04-11 NOTE — Telephone Encounter (Signed)
Rx called in pharmacy.

## 2012-04-11 NOTE — Telephone Encounter (Signed)
Refill approved - nurse to complete. 

## 2012-04-21 ENCOUNTER — Other Ambulatory Visit: Payer: Self-pay | Admitting: Internal Medicine

## 2012-05-21 DIAGNOSIS — D2120 Benign neoplasm of connective and other soft tissue of unspecified lower limb, including hip: Secondary | ICD-10-CM | POA: Diagnosis not present

## 2012-05-21 DIAGNOSIS — M79609 Pain in unspecified limb: Secondary | ICD-10-CM | POA: Diagnosis not present

## 2012-06-10 ENCOUNTER — Other Ambulatory Visit: Payer: Self-pay | Admitting: Internal Medicine

## 2012-06-11 NOTE — Telephone Encounter (Signed)
I updated the prescribed hydrocortisone dose based on the most recent office visit note from Dr. Sharl Ma dated 04/10/2012.

## 2012-06-20 ENCOUNTER — Other Ambulatory Visit: Payer: Self-pay | Admitting: Internal Medicine

## 2012-06-20 MED ORDER — LEVOTHYROXINE SODIUM 112 MCG PO TABS: 112.0000 ug | ORAL_TABLET | Freq: Every day | ORAL | Status: DC

## 2012-07-03 ENCOUNTER — Other Ambulatory Visit: Payer: Self-pay | Admitting: Internal Medicine

## 2012-07-04 NOTE — Telephone Encounter (Signed)
Refill approved - nurse to call in. 

## 2012-07-05 ENCOUNTER — Other Ambulatory Visit: Payer: Self-pay | Admitting: Internal Medicine

## 2012-07-05 NOTE — Telephone Encounter (Signed)
Both Rx called in to pharmacy. 

## 2012-09-18 ENCOUNTER — Other Ambulatory Visit: Payer: Self-pay | Admitting: *Deleted

## 2012-09-18 DIAGNOSIS — R252 Cramp and spasm: Secondary | ICD-10-CM

## 2012-09-18 MED ORDER — POTASSIUM CHLORIDE ER 10 MEQ PO TBCR: EXTENDED_RELEASE_TABLET | ORAL | Status: DC

## 2012-09-18 NOTE — Telephone Encounter (Signed)
Please schedule a follow up appointment.

## 2012-09-19 NOTE — Telephone Encounter (Signed)
Flag sent to front desk pool to make appt as directed per Dr Meredith Pel.

## 2012-10-02 ENCOUNTER — Other Ambulatory Visit: Payer: Self-pay | Admitting: Internal Medicine

## 2012-10-02 NOTE — Telephone Encounter (Signed)
Refills approved; nurse to call in lorazepam refill.

## 2012-10-02 NOTE — Telephone Encounter (Signed)
Lorazepam rx called to [facility]. 

## 2012-10-03 DIAGNOSIS — E232 Diabetes insipidus: Secondary | ICD-10-CM | POA: Diagnosis not present

## 2012-10-03 DIAGNOSIS — Z23 Encounter for immunization: Secondary | ICD-10-CM | POA: Diagnosis not present

## 2012-10-03 DIAGNOSIS — G4762 Sleep related leg cramps: Secondary | ICD-10-CM | POA: Diagnosis not present

## 2012-10-03 DIAGNOSIS — Z79899 Other long term (current) drug therapy: Secondary | ICD-10-CM | POA: Diagnosis not present

## 2012-10-03 DIAGNOSIS — E669 Obesity, unspecified: Secondary | ICD-10-CM | POA: Diagnosis not present

## 2012-10-03 DIAGNOSIS — E230 Hypopituitarism: Secondary | ICD-10-CM | POA: Diagnosis not present

## 2012-10-03 NOTE — Telephone Encounter (Signed)
Dr Augusto Garbe filled 3 month supply on 10/02/2012. Deny this refill

## 2012-10-15 ENCOUNTER — Other Ambulatory Visit: Payer: Self-pay | Admitting: *Deleted

## 2012-10-15 DIAGNOSIS — R252 Cramp and spasm: Secondary | ICD-10-CM

## 2012-10-15 MED ORDER — POTASSIUM CHLORIDE ER 10 MEQ PO TBCR: EXTENDED_RELEASE_TABLET | ORAL | Status: DC

## 2012-10-18 DIAGNOSIS — E232 Diabetes insipidus: Secondary | ICD-10-CM | POA: Diagnosis not present

## 2012-10-18 DIAGNOSIS — E669 Obesity, unspecified: Secondary | ICD-10-CM | POA: Diagnosis not present

## 2012-10-18 DIAGNOSIS — E230 Hypopituitarism: Secondary | ICD-10-CM | POA: Diagnosis not present

## 2012-10-18 DIAGNOSIS — G4762 Sleep related leg cramps: Secondary | ICD-10-CM | POA: Diagnosis not present

## 2012-10-18 DIAGNOSIS — Z79899 Other long term (current) drug therapy: Secondary | ICD-10-CM | POA: Diagnosis not present

## 2012-11-13 ENCOUNTER — Other Ambulatory Visit: Payer: Self-pay | Admitting: Internal Medicine

## 2012-11-20 ENCOUNTER — Ambulatory Visit: Payer: Medicare Other | Admitting: Internal Medicine

## 2012-11-22 ENCOUNTER — Encounter: Payer: Self-pay | Admitting: Internal Medicine

## 2012-11-22 ENCOUNTER — Other Ambulatory Visit: Payer: Self-pay | Admitting: Internal Medicine

## 2012-12-20 ENCOUNTER — Other Ambulatory Visit: Payer: Self-pay | Admitting: Internal Medicine

## 2012-12-20 NOTE — Telephone Encounter (Signed)
Message sent to front desk for a appt.

## 2012-12-20 NOTE — Telephone Encounter (Signed)
Please schedule an appointment with me and notify patient.

## 2012-12-31 DIAGNOSIS — H40019 Open angle with borderline findings, low risk, unspecified eye: Secondary | ICD-10-CM | POA: Diagnosis not present

## 2013-01-15 ENCOUNTER — Encounter: Payer: Medicare Other | Admitting: Internal Medicine

## 2013-01-15 ENCOUNTER — Other Ambulatory Visit: Payer: Self-pay | Admitting: Internal Medicine

## 2013-01-23 ENCOUNTER — Other Ambulatory Visit: Payer: Self-pay | Admitting: Internal Medicine

## 2013-01-24 NOTE — Telephone Encounter (Signed)
Refills approved - nurse to call in.  Please schedule a follow-up appointment with me.

## 2013-01-24 NOTE — Telephone Encounter (Signed)
Called to pharm 

## 2013-02-12 ENCOUNTER — Other Ambulatory Visit: Payer: Self-pay | Admitting: *Deleted

## 2013-02-12 DIAGNOSIS — M810 Age-related osteoporosis without current pathological fracture: Secondary | ICD-10-CM

## 2013-02-12 MED ORDER — ALENDRONATE SODIUM 70 MG PO TABS: 70.0000 mg | ORAL_TABLET | ORAL | Status: DC

## 2013-02-12 NOTE — Telephone Encounter (Signed)
Has not been seen in clinic for 1 year and does not have a follow-up appointment scheduled.  Will give a 3 month supply but not provide any further refills until he is seen in clinic.

## 2013-02-13 NOTE — Telephone Encounter (Signed)
Message sent to front desk to schedule pt an appt. W/Dr Meredith Pel.

## 2013-02-26 ENCOUNTER — Ambulatory Visit (INDEPENDENT_AMBULATORY_CARE_PROVIDER_SITE_OTHER): Payer: Medicare Other | Admitting: Internal Medicine

## 2013-02-26 ENCOUNTER — Encounter: Payer: Self-pay | Admitting: Internal Medicine

## 2013-02-26 VITALS — BP 115/90 | HR 69 | Temp 96.8°F | Ht 69.0 in | Wt 222.2 lb

## 2013-02-26 DIAGNOSIS — E038 Other specified hypothyroidism: Secondary | ICD-10-CM | POA: Diagnosis not present

## 2013-02-26 DIAGNOSIS — E232 Diabetes insipidus: Secondary | ICD-10-CM

## 2013-02-26 DIAGNOSIS — E7800 Pure hypercholesterolemia, unspecified: Secondary | ICD-10-CM | POA: Diagnosis not present

## 2013-02-26 DIAGNOSIS — E230 Hypopituitarism: Secondary | ICD-10-CM

## 2013-02-26 DIAGNOSIS — M810 Age-related osteoporosis without current pathological fracture: Secondary | ICD-10-CM | POA: Diagnosis not present

## 2013-02-26 DIAGNOSIS — E291 Testicular hypofunction: Secondary | ICD-10-CM | POA: Diagnosis not present

## 2013-02-26 LAB — LIPID PANEL
HDL: 41 mg/dL (ref >39)
LDL Cholesterol: 140 mg/dL — ABNORMAL HIGH (ref 0–99)
Triglycerides: 146 mg/dL (ref <150)
VLDL: 29 mg/dL (ref 0–40)

## 2013-02-26 LAB — COMPLETE METABOLIC PANEL WITH GFR
Albumin: 4.5 g/dL (ref 3.5–5.2)
CO2: 26 mEq/L (ref 19–32)
Calcium: 9.6 mg/dL (ref 8.4–10.5)
GFR, Est African American: >89 mL/min
GFR, Est Non African American: 80 mL/min
Glucose, Bld: 80 mg/dL (ref 70–99)
Potassium: 4 mEq/L (ref 3.5–5.3)
Sodium: 136 mEq/L (ref 135–145)
Total Protein: 7.1 g/dL (ref 6.0–8.3)

## 2013-02-26 LAB — CBC WITH DIFFERENTIAL/PLATELET
Basophils Absolute: 0 10*3/uL (ref 0.0–0.1)
Basophils Relative: 0 % (ref 0–1)
Eosinophils Absolute: 0.1 10*3/uL (ref 0.0–0.7)
HCT: 41.2 % (ref 39.0–52.0)
MCHC: 33.3 g/dL (ref 30.0–36.0)
Monocytes Absolute: 0.4 10*3/uL (ref 0.1–1.0)
Neutro Abs: 2.3 10*3/uL (ref 1.7–7.7)
RDW: 14.2 % (ref 11.5–15.5)

## 2013-02-26 LAB — TESTOSTERONE: Testosterone: 465 ng/dL (ref 300–890)

## 2013-02-26 MED ORDER — CYCLOBENZAPRINE HCL 5 MG PO TABS: 5.0000 mg | ORAL_TABLET | Freq: Three times a day (TID) | ORAL | Status: DC | PRN

## 2013-02-26 NOTE — Assessment & Plan Note (Signed)
Assessment: Patient reports that he is compliant with his alendronate 70 mg weekly and calcium supplementation.  Plan: Continue alendronate 70 mg once a week and calcium supplementation.

## 2013-02-26 NOTE — Assessment & Plan Note (Signed)
Assessment: Clinically appears to be doing well on his current dose of DDAVP nasal spray.  Plan: Will check a metabolic panel today; continue DDAVP nasal spray at current dose.

## 2013-02-26 NOTE — Assessment & Plan Note (Signed)
Assessment: Patient is currently on no lipid lowering medication.  Plan:  Will check a lipid panel today.

## 2013-02-26 NOTE — Patient Instructions (Signed)
Please followup with Dr. Sharl Ma as scheduled. Please bring all of your medications to each visit for review.

## 2013-02-26 NOTE — Progress Notes (Signed)
  Subjective:    Patient ID: Nicholas Holt, male    DOB: Jan 03, 1981, 32 y.o.   MRN: [id number]  HPI Patient presents for followup of his panhypopituitarism, diabetes insipidus, hyperlipidemia, and other chronic medical problems.  He was accompanied to the clinic visit by his father.  He reports that he is doing well, with no acute complaints.  He has occasional leg cramps relieved by cyclobenzaprine 5 mg, which he takes only occasionally.  He reports no apparent side effects from the medication.  He is followed by endocrinologist Dr. Sharl Ma, and reports having seen Dr. Sharl Ma within the past 4 months.  He is active, and bowls on a daily basis with his father.  His father brought some of his medications to clinic, but not all of them.  He reports that he is compliant with his medications.   Review of Systems  Constitutional: Negative for fever, chills and diaphoresis.  Respiratory: Negative for cough and shortness of breath.   Cardiovascular: Negative for chest pain and leg swelling.  Gastrointestinal: Negative for nausea, vomiting, abdominal pain and blood in stool.  Endocrine: Negative for cold intolerance, heat intolerance, polydipsia, polyphagia and polyuria.  Genitourinary: Negative for dysuria and difficulty urinating.  Musculoskeletal: Negative for back pain and arthralgias.  Skin: Negative for rash.  Neurological: Negative for dizziness, seizures, syncope, weakness and numbness.       Objective:   Physical Exam  Constitutional: No distress.  Cardiovascular: Normal rate and normal heart sounds.  Exam reveals no gallop and no friction rub.   No murmur heard. Pulmonary/Chest: Effort normal and breath sounds normal. No respiratory distress. He has no wheezes. He has no rales.  Abdominal: Soft. Bowel sounds are normal. He exhibits no distension. There is no tenderness. There is no rebound and no guarding.  Musculoskeletal: He exhibits no edema.       Assessment & Plan:

## 2013-02-26 NOTE — Assessment & Plan Note (Signed)
Assessment: Patient is doing well without apparent symptoms of thyroid dysfunction on his current levothyroxine regimen; his bottle indicates that he is taking levothyroxine 112 mcg daily, although a note from a few months ago by Dr. Sharl Ma indicated that he intended to reduce the dose slightly.   Plan: Will check a free T4 today; I advised that they confirm his current levothyroxine dose with Dr. Sharl Ma.

## 2013-02-26 NOTE — Assessment & Plan Note (Signed)
Assessment: Patient is followed by endocrinologist Dr. Talmage Coin, and reports that he has follow up scheduled for next month.  He appears to be doing well on his current regimen of medical treatment for secondary hypothyroidism, hypogonadism, and diabetes insipidus (see individual problem assessment and plan notes).  Plan: Check labs today including a comprehensive metabolic panel, CBC with differential, free T4, and testosterone level.  Patient will follow up with Dr. Sharl Ma as scheduled.

## 2013-02-26 NOTE — Assessment & Plan Note (Signed)
Assessment: Patient is doing well on current dose of AndroGel, which was adjusted a few months ago by Dr. Sharl Ma.  Plan: Continue current dose of AndroGel

## 2013-03-11 ENCOUNTER — Other Ambulatory Visit: Payer: Self-pay | Admitting: Internal Medicine

## 2013-03-17 ENCOUNTER — Other Ambulatory Visit: Payer: Self-pay | Admitting: Internal Medicine

## 2013-03-21 ENCOUNTER — Other Ambulatory Visit: Payer: Self-pay | Admitting: Internal Medicine

## 2013-04-03 DIAGNOSIS — E230 Hypopituitarism: Secondary | ICD-10-CM | POA: Diagnosis not present

## 2013-04-09 ENCOUNTER — Other Ambulatory Visit: Payer: Self-pay | Admitting: Internal Medicine

## 2013-04-09 DIAGNOSIS — E232 Diabetes insipidus: Secondary | ICD-10-CM | POA: Diagnosis not present

## 2013-04-09 DIAGNOSIS — E669 Obesity, unspecified: Secondary | ICD-10-CM | POA: Diagnosis not present

## 2013-04-09 DIAGNOSIS — E230 Hypopituitarism: Secondary | ICD-10-CM | POA: Diagnosis not present

## 2013-04-09 NOTE — Telephone Encounter (Signed)
I approve this medication for refill when it is due (apparently last refill approved on 03/16/2013); nurse to call in the refill.

## 2013-04-10 NOTE — Telephone Encounter (Signed)
Rx called in 

## 2013-04-21 ENCOUNTER — Other Ambulatory Visit: Payer: Self-pay | Admitting: Internal Medicine

## 2013-04-21 NOTE — Telephone Encounter (Signed)
I refilled this on 5/28 for #150 with 1 additional refill; please find out if the pharmacy received the refill.

## 2013-04-23 NOTE — Telephone Encounter (Signed)
The pharmacy had no record of the refill that you authorized on 5/28 and gayle called in on 5/29. They have now been given that script and pt is good starting today

## 2013-05-15 ENCOUNTER — Other Ambulatory Visit: Payer: Self-pay | Admitting: Internal Medicine

## 2013-05-15 NOTE — Progress Notes (Signed)
Encounter opened in error- closed for administrative reasons.

## 2013-05-30 DIAGNOSIS — E669 Obesity, unspecified: Secondary | ICD-10-CM | POA: Diagnosis not present

## 2013-05-30 DIAGNOSIS — E230 Hypopituitarism: Secondary | ICD-10-CM | POA: Diagnosis not present

## 2013-05-30 DIAGNOSIS — E232 Diabetes insipidus: Secondary | ICD-10-CM | POA: Diagnosis not present

## 2013-06-09 ENCOUNTER — Other Ambulatory Visit: Payer: Self-pay | Admitting: Internal Medicine

## 2013-06-23 ENCOUNTER — Other Ambulatory Visit: Payer: Self-pay | Admitting: Internal Medicine

## 2013-06-23 NOTE — Telephone Encounter (Signed)
Please schedule an appointment with me when available. 

## 2013-08-14 ENCOUNTER — Other Ambulatory Visit: Payer: Self-pay | Admitting: Internal Medicine

## 2013-08-15 NOTE — Telephone Encounter (Signed)
Refills approved - nurse to call in the lorazepam refill. 

## 2013-08-15 NOTE — Telephone Encounter (Signed)
Called to pharmacy 

## 2013-08-18 ENCOUNTER — Other Ambulatory Visit: Payer: Self-pay | Admitting: Internal Medicine

## 2013-09-06 ENCOUNTER — Other Ambulatory Visit: Payer: Self-pay | Admitting: Internal Medicine

## 2013-09-08 ENCOUNTER — Other Ambulatory Visit: Payer: Self-pay | Admitting: Internal Medicine

## 2013-09-08 MED ORDER — LEVOTHYROXINE SODIUM 112 MCG PO TABS: 112.0000 ug | ORAL_TABLET | Freq: Every day | ORAL | Status: DC

## 2013-09-17 ENCOUNTER — Encounter: Payer: Self-pay | Admitting: Internal Medicine

## 2013-09-17 ENCOUNTER — Ambulatory Visit (INDEPENDENT_AMBULATORY_CARE_PROVIDER_SITE_OTHER): Payer: Medicare Other | Admitting: Internal Medicine

## 2013-09-17 VITALS — BP 122/83 | HR 78 | Temp 98.3°F | Wt 222.7 lb

## 2013-09-17 DIAGNOSIS — E232 Diabetes insipidus: Secondary | ICD-10-CM | POA: Diagnosis not present

## 2013-09-17 DIAGNOSIS — M810 Age-related osteoporosis without current pathological fracture: Secondary | ICD-10-CM | POA: Diagnosis not present

## 2013-09-17 DIAGNOSIS — E038 Other specified hypothyroidism: Secondary | ICD-10-CM | POA: Diagnosis not present

## 2013-09-17 DIAGNOSIS — R252 Cramp and spasm: Secondary | ICD-10-CM | POA: Diagnosis not present

## 2013-09-17 DIAGNOSIS — E230 Hypopituitarism: Secondary | ICD-10-CM

## 2013-09-17 DIAGNOSIS — Z23 Encounter for immunization: Secondary | ICD-10-CM

## 2013-09-17 DIAGNOSIS — E291 Testicular hypofunction: Secondary | ICD-10-CM

## 2013-09-17 LAB — CBC WITH DIFFERENTIAL/PLATELET
Eosinophils Absolute: 0.1 10*3/uL (ref 0.0–0.7)
Eosinophils Relative: 1 % (ref 0–5)
Hemoglobin: 13.8 g/dL (ref 13.0–17.0)
Lymphs Abs: 3.5 10*3/uL (ref 0.7–4.0)
MCH: 30 pg (ref 26.0–34.0)
Monocytes Relative: 8 % (ref 3–12)
Neutro Abs: 2.9 10*3/uL (ref 1.7–7.7)
Neutrophils Relative %: 41 % — ABNORMAL LOW (ref 43–77)
RBC: 4.6 MIL/uL (ref 4.22–5.81)

## 2013-09-17 LAB — COMPLETE METABOLIC PANEL WITH GFR
ALT: 29 U/L (ref 0–53)
AST: 22 U/L (ref 0–37)
Alkaline Phosphatase: 40 U/L (ref 39–117)
CO2: 27 mEq/L (ref 19–32)
Creat: 1.18 mg/dL (ref 0.50–1.35)
GFR, Est African American: >89 mL/min
GFR, Est Non African American: 81 mL/min
Total Bilirubin: 0.5 mg/dL (ref 0.3–1.2)

## 2013-09-17 LAB — TESTOSTERONE: Testosterone: 251 ng/dL — ABNORMAL LOW (ref 300–890)

## 2013-09-17 LAB — T4, FREE: Free T4: 1.29 ng/dL (ref 0.80–1.80)

## 2013-09-17 NOTE — Patient Instructions (Signed)
Continue current medications. Follow up with Dr. Kerr as scheduled. 

## 2013-09-17 NOTE — Assessment & Plan Note (Signed)
Assessment: Doing well on alendronate 70 mg weekly and calcium supplementation, with no apparent side effects.  Plan: Continue alendronate 70 mg once weekly and calcium supplementation.

## 2013-09-17 NOTE — Assessment & Plan Note (Signed)
Assessment: Patient is doing well on current dose of AndroGel 6 pumps topically once a day.  Plan: Check a testosterone level today; continue current dose of AndroGel.

## 2013-09-17 NOTE — Assessment & Plan Note (Signed)
Assessment: Patient is followed by endocrinologist Dr. Talmage Coin, and apparently last saw him in July.  At that time, Dr. Sharl Ma decreased his hydrocortisone dose to a dose of 10 mg each morning and 5 mg each afternoon (double on sick days).  Patient reports that he has done well following that change in his hydrocortisone dose.  He also appears to be doing well on his current regimen of medical treatment for secondary hypothyroidism, hypogonadism, and diabetes insipidus (see individual assessment and plan notes for those problems).  Plan: Check labs today including a comprehensive metabolic panel, CBC with differential, free T4, and testosterone level.  I advised patient to follow up with Dr. Sharl Ma as scheduled.

## 2013-09-17 NOTE — Assessment & Plan Note (Signed)
Assessment: Doing well on his current dose of DDAVP nasal spray 10 micrograms into one nostril 4 times a day.   Plan: Check a metabolic panel today; continue DDAVP nasal spray at current dose pending that result.

## 2013-09-17 NOTE — Progress Notes (Signed)
  Subjective:    Patient ID: Nicholas Holt, male    DOB: March 01, 1981, 32 y.o.   MRN: [id number]  HPI Patient returns for followup of his panhypopituitarism with related problems including hypothyroidism, hypogonadism, and diabetes insipidus as well as other chronic medical issues.  Today he is doing well with no complaints.  He reports that he is compliant with his medications.  When he saw his endocrinologist Dr. Sharl Ma in July, his morning hydrocortisone dose was decreased from 15 mg to 10 mg each morning, and he has continued to take 5 mg each afternoon; he reports no problems with this decrease in his hydrocortisone dose.  He has occasional leg cramps, for which he takes Flexeril but not often.  He has had no other problems since his last visit here.  He is active and enjoys bowling with his father.   Review of Systems  Constitutional: Negative for fever, chills and diaphoresis.  HENT: Negative for ear pain and hearing loss.   Eyes: Negative for visual disturbance.  Respiratory: Negative for cough, shortness of breath and wheezing.   Cardiovascular: Negative for chest pain and leg swelling.  Gastrointestinal: Negative for nausea, vomiting, abdominal pain and diarrhea.  Endocrine: Negative for polydipsia, polyphagia and polyuria.  Genitourinary: Negative for dysuria and difficulty urinating.  Musculoskeletal: Negative for arthralgias and myalgias.       Objective:   Physical Exam  Constitutional: He appears well-developed and well-nourished. No distress.  Cardiovascular: Normal rate and regular rhythm.  Exam reveals no gallop and no friction rub.   No murmur heard. Pulmonary/Chest: Effort normal and breath sounds normal. No respiratory distress. He has no wheezes. He has no rales.  Abdominal: Soft. Bowel sounds are normal. He exhibits no distension. There is no hepatosplenomegaly. There is no tenderness. There is no rebound and no guarding.  Musculoskeletal: He exhibits no edema.      Assessment & Plan:

## 2013-09-17 NOTE — Assessment & Plan Note (Signed)
Free T4  Date Value Range Status  02/26/2013 1.24  0.80 - 1.80 ng/dL Final     Assessment: Patient is doing well without apparent symptoms of thyroid dysfunction on levothyroxine 112 mcg daily.  Plan: Check a free T4 today; continue levothyroxine 112 mcg daily pending that result.

## 2013-09-17 NOTE — Assessment & Plan Note (Signed)
Assessment: Patient has occasional leg cramps, for which he takes cyclobenzaprine (Flexeril) 5 mg 3 times a day as needed with good relief.  He does not take it very often.  Plan: Continue cyclobenzaprine (Flexeril) on an as-needed basis.

## 2013-10-30 ENCOUNTER — Other Ambulatory Visit: Payer: Self-pay | Admitting: Internal Medicine

## 2013-10-31 NOTE — Telephone Encounter (Signed)
Refills approved - nurse to call in the lorazepam refill. 

## 2013-10-31 NOTE — Telephone Encounter (Signed)
Called to pharm 

## 2013-11-13 ENCOUNTER — Other Ambulatory Visit: Payer: Self-pay | Admitting: Internal Medicine

## 2013-11-17 DIAGNOSIS — E230 Hypopituitarism: Secondary | ICD-10-CM | POA: Diagnosis not present

## 2013-11-20 DIAGNOSIS — E669 Obesity, unspecified: Secondary | ICD-10-CM | POA: Diagnosis not present

## 2013-11-20 DIAGNOSIS — E232 Diabetes insipidus: Secondary | ICD-10-CM | POA: Diagnosis not present

## 2013-11-20 DIAGNOSIS — E230 Hypopituitarism: Secondary | ICD-10-CM | POA: Diagnosis not present

## 2014-01-28 ENCOUNTER — Encounter: Payer: Self-pay | Admitting: Internal Medicine

## 2014-01-28 ENCOUNTER — Ambulatory Visit (INDEPENDENT_AMBULATORY_CARE_PROVIDER_SITE_OTHER): Payer: Medicare Other | Admitting: Internal Medicine

## 2014-01-28 VITALS — BP 112/76 | HR 73 | Temp 97.3°F | Ht 68.0 in | Wt 226.7 lb

## 2014-01-28 DIAGNOSIS — M810 Age-related osteoporosis without current pathological fracture: Secondary | ICD-10-CM | POA: Diagnosis not present

## 2014-01-28 DIAGNOSIS — E038 Other specified hypothyroidism: Secondary | ICD-10-CM

## 2014-01-28 DIAGNOSIS — R252 Cramp and spasm: Secondary | ICD-10-CM

## 2014-01-28 DIAGNOSIS — E236 Other disorders of pituitary gland: Secondary | ICD-10-CM | POA: Diagnosis not present

## 2014-01-28 DIAGNOSIS — E291 Testicular hypofunction: Secondary | ICD-10-CM

## 2014-01-28 DIAGNOSIS — E230 Hypopituitarism: Secondary | ICD-10-CM

## 2014-01-28 DIAGNOSIS — E232 Diabetes insipidus: Secondary | ICD-10-CM | POA: Diagnosis not present

## 2014-01-28 LAB — COMPLETE METABOLIC PANEL WITH GFR
ALBUMIN: 4.3 g/dL (ref 3.5–5.2)
ALK PHOS: 43 U/L (ref 39–117)
ALT: 27 U/L (ref 0–53)
AST: 24 U/L (ref 0–37)
BUN: 13 mg/dL (ref 6–23)
CO2: 29 mEq/L (ref 19–32)
Calcium: 9 mg/dL (ref 8.4–10.5)
Chloride: 105 mEq/L (ref 96–112)
Creat: 1.05 mg/dL (ref 0.50–1.35)
GFR, Est African American: >89 mL/min
GFR, Est Non African American: >89 mL/min
Glucose, Bld: 87 mg/dL (ref 70–99)
POTASSIUM: 4 meq/L (ref 3.5–5.3)
SODIUM: 139 meq/L (ref 135–145)
Total Bilirubin: 0.4 mg/dL (ref 0.2–1.2)
Total Protein: 6.9 g/dL (ref 6.0–8.3)

## 2014-01-28 LAB — CBC WITH DIFFERENTIAL/PLATELET
BASOS ABS: 0 10*3/uL (ref 0.0–0.1)
Basophils Relative: 0 % (ref 0–1)
Eosinophils Absolute: 0.1 10*3/uL (ref 0.0–0.7)
Eosinophils Relative: 2 % (ref 0–5)
HCT: 41 % (ref 39.0–52.0)
Hemoglobin: 13.7 g/dL (ref 13.0–17.0)
LYMPHS ABS: 3.1 10*3/uL (ref 0.7–4.0)
LYMPHS PCT: 49 % — AB (ref 12–46)
MCH: 29.8 pg (ref 26.0–34.0)
MCHC: 33.4 g/dL (ref 30.0–36.0)
MCV: 89.1 fL (ref 78.0–100.0)
MONO ABS: 0.4 10*3/uL (ref 0.1–1.0)
Monocytes Relative: 7 % (ref 3–12)
Neutro Abs: 2.7 10*3/uL (ref 1.7–7.7)
Neutrophils Relative %: 42 % — ABNORMAL LOW (ref 43–77)
Platelets: 173 10*3/uL (ref 150–400)
RBC: 4.6 MIL/uL (ref 4.22–5.81)
RDW: 14.6 % (ref 11.5–15.5)
WBC: 6.4 10*3/uL (ref 4.0–10.5)

## 2014-01-28 LAB — PHOSPHORUS: Phosphorus: 3.3 mg/dL (ref 2.3–4.6)

## 2014-01-28 LAB — T4, FREE: Free T4: 1.53 ng/dL (ref 0.80–1.80)

## 2014-01-28 NOTE — Assessment & Plan Note (Signed)
Assessment: Patient reports no recent leg cramps.  When these have occurred in the past, he reports good relief with cyclobenzaprine (Flexeril) 5 mg 3 times a day as needed.  Plan: Continue cyclobenzaprine (Flexeril) on an as-needed basis (patient takes infrequently).

## 2014-01-28 NOTE — Assessment & Plan Note (Signed)
Lab Results  Component Value Date   FREET4 1.29 09/17/2013   FREET4 1.24 02/26/2013   FREET4 1.32 08/07/2011     Assessment: Patient reports no symptoms of thyroid dysfunction on levothyroxine 112 mcg daily.  Plan: Check free T4 today; continue levothyroxine 112 mcg daily.

## 2014-01-28 NOTE — Assessment & Plan Note (Addendum)
Assessment: Patient is followed by endocrinologist Dr. Delrae Rend, who saw him in January.  Dr. Buddy Duty has decreased his hydrocortisone dose to a dose of 10 mg each morning (double on sick days), and patient reports that he has done well on that hydrocortisone dose.  He also is doing well on his current treatment for secondary hypothyroidism, hypogonadism, and diabetes insipidus (see individual assessment and plan notes for those problems).  Plan: Check labs today including a comprehensive metabolic panel, CBC with differential, free T4, and phosphorus level.  I advised patient to follow up with Dr. Buddy Duty as scheduled.

## 2014-01-28 NOTE — Assessment & Plan Note (Signed)
Assessment: Patient reports that he is compliant with his alendronate 70 mg weekly and calcium supplementation.  Last DEXA scan was in 2009.  Plan: Continue alendronate 70 mg once a week along with calcium supplementation.  Repeat DEXA scan to assess bone density on alendronate.

## 2014-01-28 NOTE — Assessment & Plan Note (Signed)
Assessment: Doing well on current dose of AndroGel 6 pumps topically once a day.  Plan: Continue current dose of AndroGel.

## 2014-01-28 NOTE — Assessment & Plan Note (Signed)
Lab Results  Component Value Date   NA 146* 09/17/2013   NA 136 02/26/2013   NA 137 08/07/2011     Assessment: Patient is doing well on DDAVP nasal spray 10 micrograms into one nostril 4 times a day.   Plan: Check comprehensive metabolic panel today; continue DDAVP nasal spray at current dose.

## 2014-01-28 NOTE — Patient Instructions (Signed)
Continue current medications. Follow up with Dr. Buddy Duty as scheduled.

## 2014-01-28 NOTE — Progress Notes (Signed)
   Subjective:    Patient ID: Nicholas Holt, male    DOB: 1981-01-19, 33 y.o.   MRN: [id number]  HPI Patient returns for management of his hypopituitarism with associated secondary hypothyroidism, hypogonadism, and diabetes insipidus.  He is doing well, and has no acute complaints.  He denies any recent problems with chills/sweats, leg cramps, insomnia, or other problems.  He reports that he is active; he likes to bowl, and bowls with his father.  He has a followup appointment scheduled with his endocrinologist Dr. Buddy Duty next month.  He reports that he is compliant with his medications.   Review of Systems  Constitutional: Negative for fever, chills, diaphoresis and activity change.  HENT: Negative for hearing loss.   Eyes: Negative for visual disturbance.  Respiratory: Negative for cough and shortness of breath.   Cardiovascular: Negative for chest pain and leg swelling.  Gastrointestinal: Negative for nausea, vomiting and abdominal pain.  Endocrine: Negative for polydipsia, polyphagia and polyuria.  Genitourinary: Negative for dysuria.  Musculoskeletal: Negative for arthralgias and myalgias.  Neurological: Negative for headaches.       Objective:   Physical Exam  Constitutional: He appears well-developed and well-nourished. No distress.  Cardiovascular: Normal rate and normal heart sounds.  Exam reveals no gallop and no friction rub.   No murmur heard. Pulmonary/Chest: Effort normal and breath sounds normal. No respiratory distress. He has no wheezes. He has no rales.  Abdominal: Soft. Bowel sounds are normal. He exhibits no distension. There is no hepatosplenomegaly. There is no tenderness. There is no rebound and no guarding.  Musculoskeletal: He exhibits no edema.       Assessment & Plan:

## 2014-02-11 ENCOUNTER — Other Ambulatory Visit: Payer: Self-pay | Admitting: Internal Medicine

## 2014-02-17 ENCOUNTER — Other Ambulatory Visit: Payer: Self-pay | Admitting: Internal Medicine

## 2014-03-10 ENCOUNTER — Other Ambulatory Visit: Payer: Self-pay | Admitting: Internal Medicine

## 2014-03-11 ENCOUNTER — Other Ambulatory Visit: Payer: Self-pay | Admitting: Internal Medicine

## 2014-03-11 NOTE — Telephone Encounter (Signed)
Refill approved - nurse to call in. 

## 2014-03-11 NOTE — Telephone Encounter (Signed)
Called to pharm 

## 2014-03-26 ENCOUNTER — Encounter: Payer: Self-pay | Admitting: *Deleted

## 2014-03-30 ENCOUNTER — Other Ambulatory Visit: Payer: Self-pay | Admitting: Internal Medicine

## 2014-04-15 ENCOUNTER — Other Ambulatory Visit: Payer: Self-pay | Admitting: Internal Medicine

## 2014-05-20 DIAGNOSIS — E230 Hypopituitarism: Secondary | ICD-10-CM | POA: Diagnosis not present

## 2014-05-20 LAB — BASIC METABOLIC PANEL
CREATININE: 1.2 mg/dL (ref 0.6–1.3)
GLUCOSE: 82 mg/dL
POTASSIUM: 4 mmol/L (ref 3.4–5.3)
Sodium: 142 mmol/L (ref 137–147)

## 2014-05-20 LAB — HEPATIC FUNCTION PANEL
ALK PHOS: 36 U/L (ref 25–125)
ALT: 30 U/L (ref 10–40)
AST: 22 U/L (ref 14–40)

## 2014-05-20 LAB — CBC AND DIFFERENTIAL
Hemoglobin: 13.3 g/dL — AB (ref 13.5–17.5)
Platelets: 172 10*3/uL (ref 150–399)
WBC: 8.6 10^3/mL

## 2014-05-29 DIAGNOSIS — E669 Obesity, unspecified: Secondary | ICD-10-CM | POA: Diagnosis not present

## 2014-05-29 DIAGNOSIS — Z6834 Body mass index (BMI) 34.0-34.9, adult: Secondary | ICD-10-CM | POA: Diagnosis not present

## 2014-05-29 DIAGNOSIS — E232 Diabetes insipidus: Secondary | ICD-10-CM | POA: Diagnosis not present

## 2014-05-29 DIAGNOSIS — E230 Hypopituitarism: Secondary | ICD-10-CM | POA: Diagnosis not present

## 2014-06-17 ENCOUNTER — Other Ambulatory Visit: Payer: Self-pay | Admitting: Internal Medicine

## 2014-06-21 ENCOUNTER — Other Ambulatory Visit: Payer: Self-pay | Admitting: Internal Medicine

## 2014-07-14 DIAGNOSIS — H40019 Open angle with borderline findings, low risk, unspecified eye: Secondary | ICD-10-CM | POA: Diagnosis not present

## 2014-08-01 ENCOUNTER — Other Ambulatory Visit: Payer: Self-pay | Admitting: Internal Medicine

## 2014-08-28 DIAGNOSIS — Z23 Encounter for immunization: Secondary | ICD-10-CM | POA: Diagnosis not present

## 2014-08-28 DIAGNOSIS — E232 Diabetes insipidus: Secondary | ICD-10-CM | POA: Diagnosis not present

## 2014-08-28 DIAGNOSIS — E230 Hypopituitarism: Secondary | ICD-10-CM | POA: Diagnosis not present

## 2014-08-29 ENCOUNTER — Other Ambulatory Visit: Payer: Self-pay | Admitting: Internal Medicine

## 2014-08-31 ENCOUNTER — Other Ambulatory Visit: Payer: Self-pay | Admitting: Internal Medicine

## 2014-08-31 NOTE — Telephone Encounter (Signed)
Please schedule a follow-up appointment with me when available.

## 2014-08-31 NOTE — Telephone Encounter (Signed)
Message sent to front office to schedule pt an appt. 

## 2014-09-07 ENCOUNTER — Other Ambulatory Visit: Payer: Self-pay | Admitting: Internal Medicine

## 2014-09-14 ENCOUNTER — Other Ambulatory Visit: Payer: Self-pay | Admitting: *Deleted

## 2014-09-14 MED ORDER — LORAZEPAM 2 MG PO TABS: ORAL_TABLET | ORAL | Status: DC

## 2014-09-14 NOTE — Telephone Encounter (Signed)
Refill approved - nurse to call in. 

## 2014-09-15 NOTE — Telephone Encounter (Signed)
Rx called in to pharmacy. 

## 2014-09-22 ENCOUNTER — Ambulatory Visit (INDEPENDENT_AMBULATORY_CARE_PROVIDER_SITE_OTHER): Payer: Medicare Other | Admitting: Internal Medicine

## 2014-09-22 ENCOUNTER — Encounter: Payer: Self-pay | Admitting: Internal Medicine

## 2014-09-22 VITALS — BP 117/76 | HR 71 | Temp 97.6°F | Ht 68.0 in | Wt 228.0 lb

## 2014-09-22 DIAGNOSIS — E291 Testicular hypofunction: Secondary | ICD-10-CM

## 2014-09-22 DIAGNOSIS — M810 Age-related osteoporosis without current pathological fracture: Secondary | ICD-10-CM

## 2014-09-22 DIAGNOSIS — E230 Hypopituitarism: Secondary | ICD-10-CM | POA: Diagnosis not present

## 2014-09-22 DIAGNOSIS — E038 Other specified hypothyroidism: Secondary | ICD-10-CM

## 2014-09-22 DIAGNOSIS — Z85841 Personal history of malignant neoplasm of brain: Secondary | ICD-10-CM

## 2014-09-22 DIAGNOSIS — E232 Diabetes insipidus: Secondary | ICD-10-CM

## 2014-09-22 DIAGNOSIS — E7800 Pure hypercholesterolemia, unspecified: Secondary | ICD-10-CM

## 2014-09-22 HISTORY — DX: Personal history of malignant neoplasm of brain: Z85.841

## 2014-09-22 MED ORDER — HYDROCORTISONE 10 MG PO TABS: ORAL_TABLET | ORAL | Status: DC

## 2014-09-22 NOTE — Assessment & Plan Note (Signed)
Assessment: Patient is followed by endocrinologist Dr. Delrae Rend, who saw him on October 16.  Dr. Buddy Duty adjusted his hydrocortisone dose to 10 mg each morning and 5 mg each afternoon (double on sick days), and patient reports that he has done well on that hydrocortisone dose.  He also is doing well on his current treatment for secondary hypothyroidism, hypogonadism, and diabetes insipidus (see individual assessment and plan notes for those problems).  Plan: Patient is scheduled to have lab work done by Dr. Buddy Duty in the near future.  I advised him to follow up with Dr. Buddy Duty as scheduled.

## 2014-09-22 NOTE — Assessment & Plan Note (Signed)
Lab Results  Component Value Date   NA 142 05/20/2014   NA 139 01/28/2014   NA 146* 09/17/2013     Assessment: Patient is doing well on DDAVP nasal spray 10 micrograms into one nostril 4 times a day.   Plan: Patient will follow up with Dr. Buddy Duty for labs; continue DDAVP nasal spray at current dose.

## 2014-09-22 NOTE — Assessment & Plan Note (Signed)
Assessment: Doing well on current dose of AndroGel 6 pumps topically once a day; last testosterone level was 1.96 ng/ML on 05/20/2014.  Plan: Continue current dose of AndroGel; follow up with Dr. Buddy Duty for labs.

## 2014-09-22 NOTE — Assessment & Plan Note (Signed)
Assessment: Patient is on no medications for hypercholesterolemia.  His last lipid panel was over one year ago.  Plan: Will have patient return for fasting lipid panel.

## 2014-09-22 NOTE — Assessment & Plan Note (Addendum)
Assessment: Patient reports no symptoms of thyroid dysfunction on levothyroxine 112 mcg daily; last free T4 was 0.86 ng/dL on 05/20/2014.  Plan: Continue levothyroxine 112 mcg daily; follow up for labs with Dr. Buddy Duty as scheduled.Marland Kitchen

## 2014-09-22 NOTE — Patient Instructions (Signed)
A DEXA scan has been ordered to assess bone density. Please follow up with Dr. Buddy Duty for labs as instructed.

## 2014-09-22 NOTE — Progress Notes (Signed)
   Subjective:    Patient ID: Nicholas Holt, male    DOB: 07-07-1981, 33 y.o.   MRN: [id number]  HPI Patient returns for management and follow-up of his hypercholesterolemia, osteoporosis, and panhypopituitarism (with diabetes insipidus, hypogonadism, and secondary hypothyroidism.)  He is doing well today, without any acute complaints.  He was accompanied to the clinic by his father.  He saw his endocrinologist Dr. Buddy Duty on 08/28/2014, and Dr. Buddy Duty increased his hydrocortisone dose from 10 mg daily to a dose of 10 mg each morning and 5 mg each afternoon.  Patient will return there this week for blood work to monitor his therapy.  He reports that he is active, and enjoys bowling with his father.  They brought a few his medications to clinic, but did not bring all of his medication bottles.     Review of Systems  Constitutional: Negative for fever, chills and diaphoresis.  HENT: Negative for hearing loss.   Eyes: Negative for visual disturbance.  Respiratory: Negative for cough, shortness of breath and wheezing.   Cardiovascular: Negative for chest pain and leg swelling.  Gastrointestinal: Negative for nausea, vomiting, abdominal pain and blood in stool.  Genitourinary: Negative for dysuria.  Musculoskeletal: Negative for back pain and arthralgias.   I reviewed and updated the medication list, allergies, past medical history, past surgical history, family history, and social history.     Objective:   Physical Exam  Constitutional: No distress.  Cardiovascular: Normal rate and regular rhythm.  Exam reveals no gallop and no friction rub.   No murmur heard. Pulmonary/Chest: Effort normal and breath sounds normal. No respiratory distress. He has no wheezes. He has no rales.  Abdominal: Soft. Bowel sounds are normal. He exhibits no distension. There is no hepatosplenomegaly. There is no tenderness. There is no rebound and no guarding.  Musculoskeletal: He exhibits no edema.  Skin: No rash  noted. He is not diaphoretic.       Assessment & Plan:

## 2014-09-22 NOTE — Assessment & Plan Note (Addendum)
Assessment: Patient has osteoporosis diagnosed 03/16/2008 by DXA scan, which showed an AP spine Z-score of -2.8, right femoral neck Z-score of -2.4, and left femoral neck Z-score of -1.5.  These values were in the face of testosterone therapy for his hypogonadism.  His osteoporosis is likely due to the combined effects of hypogonadism and chronic steroid effect, since he was previously on substantially higher doses of oral hydrocortisone.  He was started on alendronate 70 mg weekly and calcium with vitamin D in 2009, and he is doing well on alendronate with no apparent side effects.  Plan: Continue on alendronate 70 mg weekly and calcium with vitamin D; obtain a DXA scan to assess bone density.

## 2014-09-28 DIAGNOSIS — E230 Hypopituitarism: Secondary | ICD-10-CM | POA: Diagnosis not present

## 2014-10-14 ENCOUNTER — Other Ambulatory Visit: Payer: Self-pay | Admitting: Internal Medicine

## 2014-10-15 ENCOUNTER — Ambulatory Visit (HOSPITAL_COMMUNITY)
Admission: RE | Admit: 2014-10-15 | Discharge: 2014-10-15 | Disposition: A | Discharge status: Home / Self Care | Payer: Medicare Other | Source: Ambulatory Visit | Attending: Internal Medicine | Admitting: Internal Medicine

## 2014-10-15 DIAGNOSIS — M810 Age-related osteoporosis without current pathological fracture: Secondary | ICD-10-CM

## 2014-10-15 DIAGNOSIS — E232 Diabetes insipidus: Secondary | ICD-10-CM | POA: Insufficient documentation

## 2014-10-15 DIAGNOSIS — E291 Testicular hypofunction: Secondary | ICD-10-CM | POA: Diagnosis not present

## 2014-10-15 DIAGNOSIS — E038 Other specified hypothyroidism: Secondary | ICD-10-CM | POA: Diagnosis not present

## 2014-10-15 DIAGNOSIS — Z0389 Encounter for observation for other suspected diseases and conditions ruled out: Secondary | ICD-10-CM | POA: Diagnosis not present

## 2014-11-02 ENCOUNTER — Other Ambulatory Visit: Payer: Self-pay | Admitting: Internal Medicine

## 2014-11-03 NOTE — Telephone Encounter (Signed)
Refills approved - nurse to call in the lorazepam refill.

## 2014-11-09 NOTE — Telephone Encounter (Signed)
Called to pharm 

## 2015-01-23 ENCOUNTER — Other Ambulatory Visit: Payer: Self-pay | Admitting: Internal Medicine

## 2015-01-25 NOTE — Telephone Encounter (Signed)
Patient typically uses this medication infrequently as needed for muscle spasms; however, it was recently refilled 3/2 for #30 tablets and now we have received another request.  Please find out what has led to the increased use.

## 2015-01-29 ENCOUNTER — Encounter: Payer: Self-pay | Admitting: *Deleted

## 2015-01-30 ENCOUNTER — Other Ambulatory Visit: Payer: Self-pay | Admitting: Internal Medicine

## 2015-02-02 NOTE — Telephone Encounter (Signed)
Spoke w/ pt's mother, states she doesn't think he is needing more of the med than usual but when he does have cramps in his legs he might have to take 2 per day, would like for him to get 60 /month but he can speak w/ dr Marinda Elk 3/29 at his visit

## 2015-02-09 ENCOUNTER — Ambulatory Visit (INDEPENDENT_AMBULATORY_CARE_PROVIDER_SITE_OTHER): Payer: Medicare Other | Admitting: Internal Medicine

## 2015-02-09 ENCOUNTER — Encounter: Payer: Self-pay | Admitting: Internal Medicine

## 2015-02-09 VITALS — BP 131/78 | HR 71 | Temp 97.5°F | Wt 222.9 lb

## 2015-02-09 DIAGNOSIS — E232 Diabetes insipidus: Secondary | ICD-10-CM

## 2015-02-09 DIAGNOSIS — E038 Other specified hypothyroidism: Secondary | ICD-10-CM

## 2015-02-09 DIAGNOSIS — E7800 Pure hypercholesterolemia, unspecified: Secondary | ICD-10-CM

## 2015-02-09 DIAGNOSIS — E230 Hypopituitarism: Secondary | ICD-10-CM | POA: Diagnosis not present

## 2015-02-09 DIAGNOSIS — R05 Cough: Secondary | ICD-10-CM | POA: Diagnosis not present

## 2015-02-09 DIAGNOSIS — R252 Cramp and spasm: Secondary | ICD-10-CM | POA: Diagnosis not present

## 2015-02-09 DIAGNOSIS — R059 Cough, unspecified: Secondary | ICD-10-CM | POA: Insufficient documentation

## 2015-02-09 DIAGNOSIS — M810 Age-related osteoporosis without current pathological fracture: Secondary | ICD-10-CM

## 2015-02-09 DIAGNOSIS — E291 Testicular hypofunction: Secondary | ICD-10-CM | POA: Diagnosis not present

## 2015-02-09 LAB — CBC WITH DIFFERENTIAL/PLATELET
Basophils Absolute: 0 10*3/uL (ref 0.0–0.1)
Basophils Relative: 0 % (ref 0–1)
EOS PCT: 2 % (ref 0–5)
Eosinophils Absolute: 0.1 10*3/uL (ref 0.0–0.7)
HCT: 41.8 % (ref 39.0–52.0)
Hemoglobin: 13.7 g/dL (ref 13.0–17.0)
LYMPHS PCT: 43 % (ref 12–46)
Lymphs Abs: 2.6 10*3/uL (ref 0.7–4.0)
MCH: 29.4 pg (ref 26.0–34.0)
MCHC: 32.8 g/dL (ref 30.0–36.0)
MCV: 89.7 fL (ref 78.0–100.0)
MONOS PCT: 9 % (ref 3–12)
MPV: 10.3 fL (ref 8.6–12.4)
Monocytes Absolute: 0.5 10*3/uL (ref 0.1–1.0)
NEUTROS PCT: 46 % (ref 43–77)
Neutro Abs: 2.8 10*3/uL (ref 1.7–7.7)
PLATELETS: 186 10*3/uL (ref 150–400)
RBC: 4.66 MIL/uL (ref 4.22–5.81)
RDW: 14.8 % (ref 11.5–15.5)
WBC: 6 10*3/uL (ref 4.0–10.5)

## 2015-02-09 LAB — LIPID PANEL
CHOL/HDL RATIO: 5.7 ratio
Cholesterol: 212 mg/dL — ABNORMAL HIGH (ref 0–200)
HDL: 37 mg/dL — AB (ref >=40)
LDL Cholesterol: 143 mg/dL — ABNORMAL HIGH (ref 0–99)
Triglycerides: 160 mg/dL — ABNORMAL HIGH (ref <150)
VLDL: 32 mg/dL (ref 0–40)

## 2015-02-09 LAB — COMPLETE METABOLIC PANEL WITH GFR
ALT: 22 U/L (ref 0–53)
AST: 20 U/L (ref 0–37)
Albumin: 4.4 g/dL (ref 3.5–5.2)
Alkaline Phosphatase: 51 U/L (ref 39–117)
BILIRUBIN TOTAL: 0.5 mg/dL (ref 0.2–1.2)
BUN: 14 mg/dL (ref 6–23)
CALCIUM: 9.7 mg/dL (ref 8.4–10.5)
CHLORIDE: 102 meq/L (ref 96–112)
CO2: 28 meq/L (ref 19–32)
Creat: 0.86 mg/dL (ref 0.50–1.35)
Glucose, Bld: 82 mg/dL (ref 70–99)
Potassium: 4.1 mEq/L (ref 3.5–5.3)
SODIUM: 140 meq/L (ref 135–145)
Total Protein: 7.6 g/dL (ref 6.0–8.3)

## 2015-02-09 LAB — T4, FREE: Free T4: 1.37 ng/dL (ref 0.80–1.80)

## 2015-02-09 MED ORDER — CALCIUM CARBONATE-VITAMIN D 500-200 MG-UNIT PO TABS: 1.0000 | ORAL_TABLET | Freq: Two times a day (BID) | ORAL | Status: DC

## 2015-02-09 NOTE — Assessment & Plan Note (Signed)
Assessment: Patient reports occasional leg cramps, for which he takes cyclobenzaprine (Flexeril) 5 mg 3 times a day as needed.  He is apparently not using the cyclobenzaprine on a regular basis but only as needed.  He reports no apparent side effects.  Plan: Continue cyclobenzaprine (Flexeril) on an as-needed basis.

## 2015-02-09 NOTE — Assessment & Plan Note (Signed)
Assessment: Patient is doing well on current dose of AndroGel 6 pumps topically once a day.  Plan: Continue current dose of AndroGel; check a testosterone level today.

## 2015-02-09 NOTE — Assessment & Plan Note (Signed)
Assessment: Patient reports no symptoms of thyroid dysfunction on levothyroxine 112 mcg daily.  Plan: Continue levothyroxine 112 mcg daily; check a free T4 today.

## 2015-02-09 NOTE — Progress Notes (Signed)
   Subjective:    Patient ID: Nicholas Holt, male    DOB: [birth year], 34 y.o.   MRN: [id number]  HPI Patient returns for management of his panhypopituitarism with associated diabetes insipidus, secondary hypothyroidism, and male hypogonadism, and other chronic medical problems; he was accompanied to clinic today by his father.  He also has a complaint of nonproductive cough for the past 3 weeks.  He denies any URI symptoms.  He has occasional leg cramps, for which he takes cyclobenzaprine on an as-needed basis; he has apparently not been using this on a daily basis.  He reports that he has been compliant with his medications.  Other than the cough and occasional leg cramps, he reports that he has been doing well.     Review of Systems  Constitutional: Negative for fever, chills and diaphoresis.  Respiratory: Positive for cough (nonproductive). Negative for chest tightness and wheezing.   Cardiovascular: Negative for chest pain, palpitations and leg swelling.  Gastrointestinal: Negative for nausea, vomiting, abdominal pain, diarrhea and blood in stool.  Genitourinary: Negative for dysuria and flank pain.  Musculoskeletal: Negative for arthralgias.  Neurological: Negative for dizziness, seizures and syncope.    I reviewed and updated the medication list, allergies, past medical history, past surgical history, family history, and social history.     Objective:   Physical Exam  Constitutional: No distress.  HENT:  Mouth/Throat: No oropharyngeal exudate, posterior oropharyngeal edema or posterior oropharyngeal erythema.  Cardiovascular: Normal rate, regular rhythm and normal heart sounds.  Exam reveals no gallop and no friction rub.   No murmur heard. No lower extremity edema  Pulmonary/Chest: Effort normal and breath sounds normal. No respiratory distress. He has no wheezes. He has no rales.  Abdominal: Soft. Bowel sounds are normal. He exhibits no distension. There is no tenderness.  There is no rebound and no guarding.        Assessment & Plan:

## 2015-02-09 NOTE — Assessment & Plan Note (Signed)
Assessment: Patient is doing well on DDAVP nasal spray 10 micrograms into one nostril 4 times a day.   Plan: Check a comprehensive metabolic panel today; continue DDAVP nasal spray at current dose.

## 2015-02-09 NOTE — Assessment & Plan Note (Signed)
Lipids:    Component Value Date/Time   CHOL 210* 02/26/2013 0929   TRIG 146 02/26/2013 0929   HDL 41 02/26/2013 0929   LDLCALC 140* 02/26/2013 0929   VLDL 29 02/26/2013 0929   CHOLHDL 5.1 02/26/2013 0929    Assessment: Patient is on no medications for hypercholesterolemia.  His last lipid panel was in 2014.  Plan: Check a lipid panel today.

## 2015-02-09 NOTE — Patient Instructions (Signed)
Take Robitussin-DM or generic equivalent over-the-counter cough syrup as directed as needed for cough. Take calcium with vitamin D twice a day. Please see Dr. Buddy Duty as scheduled.

## 2015-02-09 NOTE — Assessment & Plan Note (Addendum)
Assessment: Patient presents with a three-week history of nonproductive cough; he has no associated symptoms.  Lung exam is completely clear.    Plan: Plan is symptomatic treatment with over-the-counter Robitussin-DM or generic equivalent.  Patient has a history of seasonal allergic rhinitis, and I recommended that he use his Flonase nasal spray in case his symptom is due to upper airway cough syndrome.  I also advised regular use of his PPI.  Will check a CBC today.  I recommended that he return in one month for follow-up; if his cough persists, then further workup with chest x-ray and possibly pulmonary function testing may be appropriate.

## 2015-02-09 NOTE — Assessment & Plan Note (Signed)
Assessment: Patient is followed by endocrinologist Dr. Delrae Rend.  Patient is currently taking hydrocortisone 10 mg each morning and 5 mg each afternoon (double on sick days), and he reports that he is doing well on that hydrocortisone dose.  He also is doing well on his current treatment for secondary hypothyroidism, hypogonadism, and diabetes insipidus (see individual assessment and plan notes for those problems).  Plan: Continue hydrocortisone at current dose; follow up with Dr. Buddy Duty as scheduled.

## 2015-02-09 NOTE — Assessment & Plan Note (Signed)
Assessment: Patient has osteoporosis diagnosed 03/16/2008 by DXA scan, which showed an AP spine Z-score of -2.8, right femoral neck Z-score of -2.4, and left femoral neck Z-score of -1.5.  These values were in the face of testosterone therapy for his hypogonadism.  His osteoporosis is likely due to the combined effects of hypogonadism and chronic steroid effect, since he was previously on substantially higher doses of oral hydrocortisone.  He was started on alendronate 70 mg weekly and calcium with vitamin D in 2009, and he is doing well on alendronate with no apparent side effects.  Recent DXA on 10/15/2014 showed an AP spine Z-score of -2.0 and a right femur neck Z-score of -2.6, which is a statistically significant increase in the bone mineral density of the lumbar spine compared to 2009.   Plan: Continue alendronate 70 mg weekly; he has recently been taking an over-the-counter vitamin D3 2500 unit supplement daily as part of a combination vitamin, but no calcium supplement.  I recommended adding calcium  500 mg with vitamin D 200 mg twice a day to his regimen.  Will check a vitamin D level today.

## 2015-02-10 LAB — TESTOSTERONE: Testosterone: 393 ng/dL (ref 300–890)

## 2015-02-10 LAB — VITAMIN D 25 HYDROXY (VIT D DEFICIENCY, FRACTURES): VIT D 25 HYDROXY: 77 ng/mL (ref 30–100)

## 2015-02-16 NOTE — Addendum Note (Signed)
Addended by: Orson Gear on: 02/16/2015 11:50 AM   Modules accepted: Orders

## 2015-02-21 ENCOUNTER — Other Ambulatory Visit: Payer: Self-pay | Admitting: Internal Medicine

## 2015-02-22 NOTE — Telephone Encounter (Signed)
Please contact patient's endocrinologist Dr. Delrae Rend for refill; he has been managing the dosing of patient's hydrocortisone.

## 2015-03-01 ENCOUNTER — Telehealth: Payer: Self-pay | Admitting: Internal Medicine

## 2015-03-01 DIAGNOSIS — M810 Age-related osteoporosis without current pathological fracture: Secondary | ICD-10-CM

## 2015-03-01 DIAGNOSIS — E7800 Pure hypercholesterolemia, unspecified: Secondary | ICD-10-CM

## 2015-03-01 NOTE — Assessment & Plan Note (Signed)
Telephone Contact Note  I called patient's mother, who manages his medications, and discussed patient's vitamin D level of 77.  Patient is taking Os-Cal-D which I started at his last visit, along with an additional vitamin D3 2500 unit capsule once daily that he previously started taking on his own.  I advised that he stop the vitamin D3 2500 unit capsule, and continue his current dose of Os-Cal with vitamin D.  I advised that his vitamin D level should be rechecked in about 2 months.

## 2015-03-01 NOTE — Progress Notes (Signed)
Quick Note:  I called patient's mother, who manages his medications, and discussed vitamin D level of 77. Patient is taking Os-Cal-D which I started at his last visit, along with an additional vitamin D3 2500 unit capsule once daily that he previously started taking on his own. I advised that he stop the vitamin D3 2500 unit capsule, and continue his current dose of Os-Cal with vitamin D. I advised that his vitamin D level should be rechecked in about 2 months. ______

## 2015-03-01 NOTE — Assessment & Plan Note (Signed)
Telephone Contact Note  I called patient's mother, who manages his medications, and discussed his lipid panel with her today.  Patient's total cholesterol and LDL are stable compared to 2 years ago.  Patient is below 35 years of age, does not have diabetes mellitus, and does not have known ASCVD, and the 2013 ACC guideline observes that no primary-prevention RCT data are available for individuals 3 to 35 years of age.  Given the lack of good evidence on benefits or long-term safety in this situation, I recommended that his lipid panel be monitored periodically and that when he reaches 35 years of age under current guidelines, or if his health situation changes, then his potential need for treatment of his lipids will need to be reassessed.

## 2015-03-01 NOTE — Telephone Encounter (Signed)
Telephone Contact Note  I called patient's mother, who manages his medications, and discussed his lipid panel with her today.  Patient's total cholesterol and LDL are stable compared to 2 years ago.  Patient is below 35 years of age, does not have diabetes mellitus, and does not have known ASCVD, and the 2013 ACC guideline observes that no primary-prevention RCT data are available for individuals 28 to 35 years of age.  Given the lack of good evidence on benefits or long-term safety in this situation, I recommended that his lipid panel be monitored periodically and that when he reaches 35 years of age under current guidelines, or if his health situation changes, then his potential need for treatment of his lipids will need to be reassessed.  I also discussed patient's vitamin D level of 77.  Patient is taking Os-Cal-D which I started at his last visit, along with an additional vitamin D3 2500 unit capsule once daily that he previously started taking on his own.  I advised that he stop the vitamin D3 2500 unit capsule, and continue his current dose of Os-Cal with vitamin D.  I advised that his vitamin D level should be rechecked in about 2 months.  I also asked about patient's nonproductive cough, noted at his last clinic visit on 02/09/2015, and she reported that he is still having some problems with the cough.  I advised that she schedule a follow-up appointment in clinic within a month for evaluation of this.

## 2015-03-01 NOTE — Progress Notes (Signed)
Quick Note:  I called patient's mother, who manages his medications, and discussed his lipid panel with her today. Patient's total cholesterol and LDL are stable compared to 2 years ago. Patient is below 35 years of age, does not have diabetes mellitus, and does not have known ASCVD, and the 2013 ACC guideline observes that no primary-prevention RCT data are available for individuals 33 to 35 years of age. Given the lack of good evidence on benefits or long-term safety in this situation, I recommended that his lipid panel be monitored periodically and that when he reaches 35 years of age under current guidelines, or if his health situation changes, then his potential need for treatment of his lipids will need to be reassessed. ______

## 2015-03-10 ENCOUNTER — Other Ambulatory Visit: Payer: Self-pay | Admitting: *Deleted

## 2015-03-10 MED ORDER — ALENDRONATE SODIUM 70 MG PO TABS: 70.0000 mg | ORAL_TABLET | ORAL | Status: DC

## 2015-03-12 ENCOUNTER — Ambulatory Visit (INDEPENDENT_AMBULATORY_CARE_PROVIDER_SITE_OTHER): Payer: Medicare Other | Admitting: Internal Medicine

## 2015-03-12 ENCOUNTER — Encounter: Payer: Self-pay | Admitting: Internal Medicine

## 2015-03-12 VITALS — BP 131/75 | HR 71 | Temp 98.0°F | Wt 218.3 lb

## 2015-03-12 DIAGNOSIS — E7800 Pure hypercholesterolemia, unspecified: Secondary | ICD-10-CM

## 2015-03-12 DIAGNOSIS — E232 Diabetes insipidus: Secondary | ICD-10-CM | POA: Diagnosis not present

## 2015-03-12 DIAGNOSIS — E230 Hypopituitarism: Secondary | ICD-10-CM

## 2015-03-12 DIAGNOSIS — R05 Cough: Secondary | ICD-10-CM

## 2015-03-12 DIAGNOSIS — R059 Cough, unspecified: Secondary | ICD-10-CM

## 2015-03-12 MED ORDER — DESMOPRESSIN ACETATE SPRAY 0.01 % NA SOLN: 1.0000 | Freq: Four times a day (QID) | NASAL | Status: DC

## 2015-03-12 NOTE — Progress Notes (Signed)
Internal Medicine Clinic Attending  Case discussed with Dr. McLean at the time of the visit.  We reviewed the resident's history and exam and pertinent patient test results.  I agree with the assessment, diagnosis, and plan of care documented in the resident's note. 

## 2015-03-12 NOTE — Progress Notes (Signed)
Subjective:    Patient ID: Nicholas Holt, male    DOB: 29-Apr-1981, 34 y.o.   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 Father is with him and doing most of talking  1. Cough resolved with OTC cough syrup 2. Leg cramps chronic though not particularly bad now  3. H/o Panhypopituitarism. Follows with Dr. Buddy Duty with have appt 5/5.  Father wants to know who will be PCP since they have been seeing Dr. Marinda Elk since 2000. He prefers his son see one doctor.  4. Reviewed labs at last visit wnl except LDL 143 ATP risk score 1%   SH: lives with mom  and dad         Review of Systems  Constitutional: Negative for appetite change.  Respiratory: Negative for shortness of breath.   Cardiovascular: Negative for chest pain and leg swelling.  Gastrointestinal: Negative for abdominal pain.       Objective:   Physical Exam  Constitutional: He is oriented to person, place, and time. Vital signs are normal. He appears well-developed and well-nourished. He is cooperative.  HENT:  Head: Normocephalic and atraumatic.  Eyes: Conjunctivae are normal. Right eye exhibits no discharge. Left eye exhibits  no discharge. No scleral icterus.  Cardiovascular: Normal rate, regular rhythm, S1 normal, S2 normal and normal heart sounds.   No murmur heard. Neg edema   Pulmonary/Chest: Effort normal and breath sounds normal.  Abdominal: Soft. Bowel sounds are normal. He exhibits no distension. There is no tenderness.  Musculoskeletal: He exhibits no edema.  Neurological: He is alert and oriented to person, place, and time. Gait normal.  Skin: Skin is warm, dry and intact. No rash noted.  Psychiatric: He has a normal mood and affect. His speech is normal and behavior is normal. Judgment and thought content normal. Cognition and memory are normal.  Nursing note and vitals reviewed.         Assessment & Plan:

## 2015-03-12 NOTE — Assessment & Plan Note (Signed)
Resolved. Likely 2/2 URI

## 2015-03-12 NOTE — Assessment & Plan Note (Signed)
Rx refill of DDAVP qid today

## 2015-03-12 NOTE — Patient Instructions (Addendum)
Take care I think you are doing well today  Please follow up in 5-6 months sooner if needed   Cholesterol Cholesterol is a white, waxy, fat-like substance needed by your body in small amounts. The liver makes all the cholesterol you need. Cholesterol is carried from the liver by the blood through the blood vessels. Deposits of cholesterol (plaque) may build up on blood vessel walls. These make the arteries narrower and stiffer. Cholesterol plaques increase the risk for heart attack and stroke.  You cannot feel your cholesterol level even if it is very high. The only way to know it is high is with a blood test. Once you know your cholesterol levels, you should keep a record of the test results. Work with your health care provider to keep your levels in the desired range.  WHAT DO THE RESULTS MEAN?  Total cholesterol is a rough measure of all the cholesterol in your blood.   LDL is the so-called bad cholesterol. This is the type that deposits cholesterol in the walls of the arteries. You want this level to be low.   HDL is the good cholesterol because it cleans the arteries and carries the LDL away. You want this level to be high.  Triglycerides are fat that the body can either burn for energy or store. High levels are closely linked to heart disease.  WHAT ARE THE DESIRED LEVELS OF CHOLESTEROL?  Total cholesterol below 200.   LDL below 100 for people at risk, below 70 for those at very high risk.   HDL above 50 is good, above 60 is best.   Triglycerides below 150.  HOW CAN I LOWER MY CHOLESTEROL?  Diet. Follow your diet programs as directed by your health care provider.   Choose fish or white meat chicken and [place], roasted or baked. Limit fatty cuts of red meat, fried foods, and processed meats, such as sausage and lunch meats.   Eat lots of fresh fruits and vegetables.  Choose whole grains, beans, pasta, potatoes, and cereals.   Use only small amounts of olive, corn, or  canola oils.   Avoid butter, mayonnaise, shortening, or palm kernel oils.  Avoid foods with trans fats.   Drink skim or nonfat milk and eat low-fat or nonfat yogurt and cheeses. Avoid whole milk, cream, ice cream, egg yolks, and full-fat cheeses.   Healthy desserts include angel food cake, ginger snaps, animal crackers, hard candy, popsicles, and low-fat or nonfat frozen yogurt. Avoid pastries, cakes, pies, and cookies.   Exercise. Follow your exercise programs as directed by your health care provider.   A regular program helps decrease LDL and raise HDL.   A regular program helps with weight control.   Do things that increase your activity level like gardening, walking, or taking the stairs. Ask your health care provider about how you can be more active in your daily life.   Medicine. Take medicine only as directed by your health care provider.   Medicine may be prescribed by your health care provider to help lower cholesterol and decrease the risk for heart disease.   If you have several risk factors, you may need medicine even if your levels are normal. Document Released: 07/25/2001 Document Revised: 03/16/2014 Document Reviewed: 08/13/2013 [facility] Patient Information 2015 [place], [place]. This information is not intended to replace advice given to you by your health care provider. Make sure you discuss any questions you have with your health care provider.  Cough, Adult  A cough is  a reflex that helps clear your throat and airways. It can help heal the body or may be a reaction to an irritated airway. A cough may only last 2 or 3 weeks (acute) or may last more than 8 weeks (chronic).  CAUSES Acute cough:  Viral or bacterial infections. Chronic cough:  Infections.  Allergies.  Asthma.  Post-nasal drip.  Smoking.  Heartburn or acid reflux.  Some medicines.  Chronic lung problems (COPD).  Cancer. SYMPTOMS   Cough.  Fever.  Chest pain.  Increased  breathing rate.  High-pitched whistling sound when breathing (wheezing).  Colored mucus that you cough up (sputum). TREATMENT   A bacterial cough may be treated with antibiotic medicine.  A viral cough must run its course and will not respond to antibiotics.  Your caregiver may recommend other treatments if you have a chronic cough. HOME CARE INSTRUCTIONS   Only take over-the-counter or prescription medicines for pain, discomfort, or fever as directed by your caregiver. Use cough suppressants only as directed by your caregiver.  Use a cold steam vaporizer or humidifier in your bedroom or home to help loosen secretions.  Sleep in a semi-upright position if your cough is worse at night.  Rest as needed.  Stop smoking if you smoke. SEEK IMMEDIATE MEDICAL CARE IF:   You have pus in your sputum.  Your cough starts to worsen.  You cannot control your cough with suppressants and are losing sleep.  You begin coughing up blood.  You have difficulty breathing.  You develop pain which is getting worse or is uncontrolled with medicine.  You have a fever. MAKE SURE YOU:   Understand these instructions.  Will watch your condition.  Will get help right away if you are not doing well or get worse. Document Released: 04/28/2011 Document Revised: 01/22/2012 Document Reviewed: 04/28/2011 [facility] Patient Information 2015 [place], [place]. This information is not intended to replace advice given to you by your health care provider. Make sure you discuss any questions you have with your health care provider.

## 2015-03-12 NOTE — Assessment & Plan Note (Signed)
Lipid Panel     Component Value Date/Time   CHOL 212* 02/09/2015 1032   TRIG 160* 02/09/2015 1032   HDL 37* 02/09/2015 1032   CHOLHDL 5.7 02/09/2015 1032   VLDL 32 02/09/2015 1032   LDLCALC 143* 02/09/2015 1032   disc'ed lifestyle mod. Given info

## 2015-03-12 NOTE — Assessment & Plan Note (Signed)
Follows with Dr. Buddy Duty 5/5

## 2015-03-18 DIAGNOSIS — E230 Hypopituitarism: Secondary | ICD-10-CM | POA: Diagnosis not present

## 2015-03-18 DIAGNOSIS — Z5181 Encounter for therapeutic drug level monitoring: Secondary | ICD-10-CM | POA: Diagnosis not present

## 2015-03-18 DIAGNOSIS — E232 Diabetes insipidus: Secondary | ICD-10-CM | POA: Diagnosis not present

## 2015-04-02 ENCOUNTER — Encounter: Payer: Self-pay | Admitting: Internal Medicine

## 2015-04-04 ENCOUNTER — Other Ambulatory Visit: Payer: Self-pay | Admitting: Internal Medicine

## 2015-04-05 ENCOUNTER — Other Ambulatory Visit: Payer: Self-pay | Admitting: Internal Medicine

## 2015-04-05 NOTE — Telephone Encounter (Signed)
rx called in

## 2015-04-12 ENCOUNTER — Other Ambulatory Visit: Payer: Self-pay | Admitting: Internal Medicine

## 2015-04-13 ENCOUNTER — Other Ambulatory Visit: Payer: Self-pay | Admitting: Internal Medicine

## 2015-04-13 DIAGNOSIS — R252 Cramp and spasm: Secondary | ICD-10-CM

## 2015-04-13 MED ORDER — POTASSIUM CHLORIDE ER 10 MEQ PO TBCR: 10.0000 meq | EXTENDED_RELEASE_TABLET | Freq: Three times a day (TID) | ORAL | Status: DC

## 2015-04-13 NOTE — Telephone Encounter (Signed)
Called pt's mother and she states he takes K+ for leg cramps along with cyclobenzaprine and magnesium over the counter. Spoke w/ front office it will be July before pt can be scheduled with dr Eppie Gibson

## 2015-04-13 NOTE — Telephone Encounter (Signed)
I have never seen Nicholas Holt, nor could I find a documented indication for the KCl, hence the 30 day supply until he is seen by me and this is clarified.  Please inquire as to what their understanding of why he is on the KCl.  It may help clarify what else may be going on, but is not clearly documented in the chart or understood by me.  Thank You.  Please schedule him for my next open last clinic slot of the morning so I may spend some extra time reviewing his history if I am to be his new PCP.

## 2015-05-02 ENCOUNTER — Other Ambulatory Visit: Payer: Self-pay | Admitting: Internal Medicine

## 2015-05-02 DIAGNOSIS — E230 Hypopituitarism: Secondary | ICD-10-CM

## 2015-05-03 MED ORDER — HYDROCORTISONE 10 MG PO TABS: ORAL_TABLET | ORAL | Status: DC

## 2015-05-17 ENCOUNTER — Other Ambulatory Visit: Payer: Self-pay | Admitting: Internal Medicine

## 2015-05-17 DIAGNOSIS — R252 Cramp and spasm: Secondary | ICD-10-CM

## 2015-06-10 ENCOUNTER — Other Ambulatory Visit: Payer: Self-pay | Admitting: Internal Medicine

## 2015-06-11 NOTE — Telephone Encounter (Signed)
Rx called in 

## 2015-06-27 ENCOUNTER — Other Ambulatory Visit: Payer: Self-pay | Admitting: Internal Medicine

## 2015-06-27 DIAGNOSIS — M810 Age-related osteoporosis without current pathological fracture: Secondary | ICD-10-CM

## 2015-07-09 ENCOUNTER — Encounter: Payer: Self-pay | Admitting: Internal Medicine

## 2015-07-09 ENCOUNTER — Ambulatory Visit (INDEPENDENT_AMBULATORY_CARE_PROVIDER_SITE_OTHER): Payer: Medicare Other | Admitting: Internal Medicine

## 2015-07-09 VITALS — BP 126/86 | HR 71 | Temp 98.4°F | Ht 68.0 in | Wt 220.3 lb

## 2015-07-09 DIAGNOSIS — E669 Obesity, unspecified: Secondary | ICD-10-CM | POA: Diagnosis not present

## 2015-07-09 DIAGNOSIS — Z23 Encounter for immunization: Secondary | ICD-10-CM | POA: Diagnosis not present

## 2015-07-09 DIAGNOSIS — Z85841 Personal history of malignant neoplasm of brain: Secondary | ICD-10-CM

## 2015-07-09 DIAGNOSIS — E785 Hyperlipidemia, unspecified: Secondary | ICD-10-CM | POA: Diagnosis not present

## 2015-07-09 DIAGNOSIS — G40909 Epilepsy, unspecified, not intractable, without status epilepticus: Secondary | ICD-10-CM | POA: Diagnosis not present

## 2015-07-09 DIAGNOSIS — R252 Cramp and spasm: Secondary | ICD-10-CM | POA: Diagnosis not present

## 2015-07-09 DIAGNOSIS — J986 Disorders of diaphragm: Secondary | ICD-10-CM | POA: Diagnosis not present

## 2015-07-09 DIAGNOSIS — E230 Hypopituitarism: Secondary | ICD-10-CM | POA: Diagnosis not present

## 2015-07-09 DIAGNOSIS — Z0000 Encounter for general adult medical examination without abnormal findings: Secondary | ICD-10-CM

## 2015-07-09 DIAGNOSIS — J309 Allergic rhinitis, unspecified: Secondary | ICD-10-CM

## 2015-07-09 DIAGNOSIS — T380X5A Adverse effect of glucocorticoids and synthetic analogues, initial encounter: Secondary | ICD-10-CM

## 2015-07-09 DIAGNOSIS — M859 Disorder of bone density and structure, unspecified: Secondary | ICD-10-CM

## 2015-07-09 DIAGNOSIS — M8580 Other specified disorders of bone density and structure, unspecified site: Secondary | ICD-10-CM

## 2015-07-09 DIAGNOSIS — E663 Overweight: Secondary | ICD-10-CM | POA: Insufficient documentation

## 2015-07-09 DIAGNOSIS — J3089 Other allergic rhinitis: Secondary | ICD-10-CM

## 2015-07-09 DIAGNOSIS — E66811 Obesity, class 1: Secondary | ICD-10-CM

## 2015-07-09 HISTORY — DX: Disorders of diaphragm: J98.6

## 2015-07-09 HISTORY — DX: Obesity, unspecified: E66.9

## 2015-07-09 HISTORY — DX: Encounter for general adult medical examination without abnormal findings: Z00.00

## 2015-07-09 HISTORY — DX: Other allergic rhinitis: J30.89

## 2015-07-09 HISTORY — DX: Obesity, class 1: E66.811

## 2015-07-09 MED ORDER — CYCLOBENZAPRINE HCL 10 MG PO TABS: 10.0000 mg | ORAL_TABLET | Freq: Three times a day (TID) | ORAL | Status: DC | PRN

## 2015-07-09 MED ORDER — HYDROCORTISONE 10 MG PO TABS: 15.0000 mg | ORAL_TABLET | Freq: Every day | ORAL | Status: DC

## 2015-07-09 MED ORDER — POTASSIUM CHLORIDE ER 10 MEQ PO TBCR: 20.0000 meq | EXTENDED_RELEASE_TABLET | Freq: Two times a day (BID) | ORAL | Status: DC

## 2015-07-09 MED ORDER — DDAVP 0.01 % NA SOLN: 10.0000 ug | Freq: Four times a day (QID) | NASAL | Status: DC

## 2015-07-09 MED ORDER — DDAVP 0.1 MG PO TABS: 0.1000 mg | ORAL_TABLET | Freq: Every day | ORAL | Status: DC

## 2015-07-09 NOTE — Assessment & Plan Note (Signed)
His mother asked the excellent question of whether or not there was a role for continued MRI surveillance of his brain given his history of the suprasellar germinoma. A review of the literature does not provide guidance on this issue 19 years after the diagnosis. His most recent MRI was in 2009. He's had no new symptoms suggesting a change in his neurologic status. Although late recurring tumors have been reported the cure rate is in excess of 80-90%. Given the difficulty he has with claustrophobia in the MRI scanners and the fact there is no guidance on how frequently to have subsequent MRIs, especially this late out, we will defer any further MRI surveillance and follow him clinically.

## 2015-07-09 NOTE — Assessment & Plan Note (Signed)
He currently is without any complaints attributable to panhypopituitarism. He states he's been compliant with his medications which include DDAVP 1 spray (10 g) 4 times daily, hydrocortisone 15 mg daily, Synthroid 112 g daily and AndroGel 6 pumps topically daily. He follows closely with Dr. Buddy Duty. We will continue with this regimen at these doses. As labs were recently checked by Dr. Buddy Duty we did not repeat any blood work today. We will reassess for symptoms at the follow-up visit.

## 2015-07-09 NOTE — Progress Notes (Signed)
   Subjective:    Patient ID: Nicholas Holt, male    DOB: [birth year], 34 y.o.   MRN: [id number]  HPI  Nicholas Holt is here for follow-up of panhypopitutarism. Please see the A&P for the status of the pt's chronic medical problems.  He had been followed by Dr. Marinda Elk since at least 2000.  With Dr. Marinda Elk retirement [name]' care has been transferred to me and this is the first time I am meeting him.  He is w/o any specific complaints.  Review of Systems  Constitutional: Negative for activity change, appetite change and unexpected weight change.  HENT: Positive for rhinorrhea.   Respiratory: Negative for apnea, shortness of breath and wheezing.   Cardiovascular: Negative for leg swelling.  Gastrointestinal: Negative for nausea, vomiting, abdominal pain, diarrhea, constipation and abdominal distention.  Musculoskeletal: Positive for gait problem. Negative for myalgias, back pain and arthralgias.  Skin: Negative for rash and wound.  Allergic/Immunologic: Positive for environmental allergies.  Neurological: Negative for seizures, syncope, weakness and headaches.  Psychiatric/Behavioral: Positive for sleep disturbance. Negative for dysphoric mood. The patient is nervous/anxious.       Objective:   Physical Exam  Constitutional: He is oriented to person, place, and time. He appears well-developed and well-nourished. No distress.  HENT:  Head: Normocephalic and atraumatic.  Eyes: Conjunctivae are normal. Right eye exhibits no discharge. Left eye exhibits no discharge. No scleral icterus.  Cardiovascular: Normal rate, regular rhythm and normal heart sounds.  Exam reveals no gallop and no friction rub.   No murmur heard. Pulmonary/Chest: Effort normal and breath sounds normal. No respiratory distress. He has no wheezes. He has no rales.  Abdominal: Soft. Bowel sounds are normal. He exhibits no distension. There is no tenderness. There is no rebound and no guarding.  Musculoskeletal:  He exhibits no edema or tenderness.  Neurological: He is alert and oriented to person, place, and time.  Skin: Skin is warm and dry. No rash noted. He is not diaphoretic. No erythema.  Psychiatric: He has a normal mood and affect. His behavior is normal. Thought content normal.  Nursing note and vitals reviewed.     Assessment & Plan:   Please see problem oriented charting.

## 2015-07-09 NOTE — Assessment & Plan Note (Signed)
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 At this point his hydrocortisone dose has been reduced and is hypogonadism is clinically well managed. We will reassess the degree of his osteopenia on a repeat DEXA scan in December 2017. In the meantime, he will continue with his calcium and vitamin D supplementation.

## 2015-07-09 NOTE — Assessment & Plan Note (Signed)
Although his seizure disorder predated the diagnosis of his cancer he did have a generalized seizure with the inadvertent withdraw of lorazepam. We will therefore continue the lorazepam at the current relatively high dose for his anxiety and seizure disorder.

## 2015-07-09 NOTE — Assessment & Plan Note (Signed)
He received his flu vaccination today. Given the hydrocortisone dose of 15 mg by mouth daily, one might consider him to be slightly immunocompromised and therefore a candidate for a pneumococcal vaccination. We will consider a pneumococcal 13 vaccination at a subsequent visit that does not align with timing for a flu vaccination. Otherwise he is up-to-date on his health care maintenance.

## 2015-07-09 NOTE — Patient Instructions (Signed)
It was nice to meet you.  You are doing a great job with your health.  1) Keep taking your medications as you are.  2) Keep your appointments with Dr. Buddy Duty.  3) We gave you a flu shot today.  I will see you in 1 year, sooner if necessary.

## 2015-07-09 NOTE — Assessment & Plan Note (Signed)
His leg cramps continue to be a problem particularly at night. He has tried several muscle relaxants. He is currently on cyclobenzaprine 5 mg by mouth every 8 hours as needed for cramping. He is also on potassium chloride 20 mEq by mouth twice daily. This regimen has worked as well as any other regimen he has been on. That being said, he continues to have difficulty with cramping of his muscles, particularly his legs at night. We will increase the cyclobenzaprine to 10 mg by mouth every 8 hours as needed and continue the potassium chloride at 20 mEq by mouth twice daily. We will reassess the efficacy of this adjustment at the follow-up visit.

## 2015-07-09 NOTE — Assessment & Plan Note (Signed)
Although DDAVP has been reported to result in rhinitis in 8% of patients, he may have a perennial allergic component to his symptoms. We will therefore continue the Flonase each night. We will reassess his rhinitis symptoms at the follow-up visit.

## 2015-07-09 NOTE — Assessment & Plan Note (Signed)
Exercise remains difficult given his motor tic disorder. That being said there are opportunities for improvement with his diet. We discussed avoiding, or at least limiting, his fast food. We will reassess his weight at the follow-up visit.

## 2015-07-09 NOTE — Assessment & Plan Note (Signed)
He has an elevated left hemidiaphragm on the 2 most recent chest x-rays that I reviewed. This is likely secondary to his Port-A-Cath placement. As he is without dyspnea and is saturating well on room air no further evaluation or intervention is necessary at this time.

## 2015-07-09 NOTE — Assessment & Plan Note (Signed)
Although he has a total cholesterol of 212, an HDL of 37, and a LDL of 143, his 10 year risk for a cardiovascular event is less than 4%. He therefore does not qualify for statin therapy. Instead, he may benefit from an improved diet with less fast food. I discussed this issue with him. At sometime in the future we can reassess his lipid level if his diet and weight improve.

## 2015-09-22 DIAGNOSIS — E230 Hypopituitarism: Secondary | ICD-10-CM | POA: Diagnosis not present

## 2015-09-22 DIAGNOSIS — D649 Anemia, unspecified: Secondary | ICD-10-CM | POA: Diagnosis not present

## 2015-09-22 DIAGNOSIS — E232 Diabetes insipidus: Secondary | ICD-10-CM | POA: Diagnosis not present

## 2015-09-22 DIAGNOSIS — Z5181 Encounter for therapeutic drug level monitoring: Secondary | ICD-10-CM | POA: Diagnosis not present

## 2015-09-22 DIAGNOSIS — Z23 Encounter for immunization: Secondary | ICD-10-CM | POA: Diagnosis not present

## 2015-12-08 ENCOUNTER — Ambulatory Visit (INDEPENDENT_AMBULATORY_CARE_PROVIDER_SITE_OTHER): Payer: Medicare Other | Admitting: Internal Medicine

## 2015-12-08 ENCOUNTER — Encounter: Payer: Self-pay | Admitting: Internal Medicine

## 2015-12-08 ENCOUNTER — Ambulatory Visit (HOSPITAL_COMMUNITY)
Admit: 2015-12-08 | Discharge: 2015-12-08 | Disposition: A | Discharge status: Home / Self Care | Payer: Medicare Other | Source: Ambulatory Visit | Attending: Internal Medicine | Admitting: Internal Medicine

## 2015-12-08 VITALS — BP 136/73 | HR 91 | Temp 98.4°F | Ht 68.0 in | Wt 212.8 lb

## 2015-12-08 DIAGNOSIS — J9811 Atelectasis: Secondary | ICD-10-CM | POA: Insufficient documentation

## 2015-12-08 DIAGNOSIS — E230 Hypopituitarism: Secondary | ICD-10-CM

## 2015-12-08 DIAGNOSIS — R059 Cough, unspecified: Secondary | ICD-10-CM | POA: Insufficient documentation

## 2015-12-08 DIAGNOSIS — R05 Cough: Secondary | ICD-10-CM | POA: Insufficient documentation

## 2015-12-08 DIAGNOSIS — R509 Fever, unspecified: Secondary | ICD-10-CM | POA: Insufficient documentation

## 2015-12-08 DIAGNOSIS — J986 Disorders of diaphragm: Secondary | ICD-10-CM | POA: Diagnosis not present

## 2015-12-08 HISTORY — DX: Cough, unspecified: R05.9

## 2015-12-08 MED ORDER — GUAIFENESIN-CODEINE 200-20 MG/5ML PO LIQD: 5.0000 mL | Freq: Four times a day (QID) | ORAL | Status: DC | PRN

## 2015-12-08 MED ORDER — ALBUTEROL SULFATE HFA 108 (90 BASE) MCG/ACT IN AERS: 1.0000 | INHALATION_SPRAY | Freq: Four times a day (QID) | RESPIRATORY_TRACT | Status: DC | PRN

## 2015-12-08 NOTE — Patient Instructions (Signed)
General Instructions:  I want you to go upstairs and get a chest x-ray.  I will call with the results.  You can take cough syrup every 6 hours, it is better to take at night than during the day.  Remember to double your Cortef dose for 3 days when you are sick.  Please bring your medicines with you each time you come to clinic.  Medicines may include prescription medications, over-the-counter medications, herbal remedies, eye drops, vitamins, or other pills.   Progress Toward Treatment Goals:  No flowsheet data found.  Self Care Goals & Plans:  No flowsheet data found.  No flowsheet data found.   Care Management & Community Referrals:  Referral 03/12/2015  Referrals made to community resources none

## 2015-12-08 NOTE — Assessment & Plan Note (Signed)
HPI: Mom reports she has doubled his dose of hydrocortisone  A: Panhypopituitarism  P: Reinforced hydrocortisone dosing.

## 2015-12-08 NOTE — Assessment & Plan Note (Addendum)
A: Cough  P: Given patient's history of PNA, elevated hemidiaphram and subjective fevers will get a CXR to evaluate for PNA. - Will treat with cough expectorant syrup guaifenesin-codeine. - Refilled albuterol inhaler

## 2015-12-08 NOTE — Progress Notes (Signed)
[facility] Subjective:   Patient ID: Nicholas Holt male   DOB: 07/13/1981 35 y.o.   MRN: [id number]  HPI: [name] is a 35 y.o. male with a PMH detailed below who presents for an acute visit for a cold. Reports cough since last Friday night. Mom reports that he has been coughing especially at night which is keeping both of them up.  She hears rattling in his chest but he does not appear to be getting anything up.  He has felt feverish but they have not checked a temperature.  He denies any SOB except when he is coughing.  They have not found any alleviating factors. He has had PNA three times per mother. She called his endocrinologist yesterday who advised he been seen by PCP.    Past Medical History  Diagnosis Date  . 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  Complicated by left sided motor tic disorder and panhypopituitarism.   . 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  Includes central diabetes insipidus, secondary hypothyroidism, secondary adrenal insufficiency, and secondary hypogonadism.   . Seizure disorder 08/08/2006    Predated brain tumor.  Generalized seizure ocurred after running out of lorazepam post-tumor.   . Attention deficit disorder 08/08/2006  . 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 Treated with alendronate from 2009-2016. DEXA (03/16/2008): L Spine T -2.0. L fem neck T -0.9, R fem neck T -1.9.  DEXA (10/15/2014): L Spine T -1.3, L fem neck T -0.6, R fem neck T -1.8.  Bisphosphonate holiday 06/2015.  Repeat DEXA scan in 10/2016 to reassess.   . Bilateral leg cramps 11/25/2006  . Hyperlipidemia 08/08/2006  . Elevated hemidiaphragm 07/09/2015    Left, presumably  after portacath placement.   . Obesity (BMI 30.0-34.9) 07/09/2015  . Anxiety   . Blood transfusion without reported diagnosis 1997    Chemotherapy associated  . Perennial allergic rhinitis 07/09/2015   Current Outpatient Prescriptions  Medication Sig Dispense Refill  . albuterol (PROAIR HFA) 108 (90 BASE) MCG/ACT inhaler Inhale 1-2 puffs into the lungs every 6 (six) hours as needed.      . Ascorbic Acid (VITAMIN C) 1000 MG tablet Take 1,000 mg by mouth daily.    . B Complex Vitamins (VITAMIN B COMPLEX) CAPS Take 1 capsule by mouth once a day    . calcium-vitamin D (OSCAL 500/200 D-3) 500-200 MG-UNIT per tablet Take 1 tablet by mouth 2 (two) times daily. 60 tablet 5  . cyclobenzaprine (FLEXERIL) 10 MG tablet Take 1 tablet (10 mg total) by mouth 3 (three) times daily as needed for muscle spasms. 90 tablet 11  . DDAVP 0.01 % solution Place 1 spray (10 mcg total) into the nose 4 (four) times daily. 20 mL 11  . fluticasone (FLONASE) 50 MCG/ACT nasal spray Place 2 sprays into the nose at bedtime.      . hydrocortisone (CORTEF) 10 MG tablet Take 1.5 tablets (15 mg total) by mouth daily. Double the dose on sick days 135 tablet 3  . levothyroxine (SYNTHROID, LEVOTHROID) 112 MCG tablet Take 1 tablet (112 mcg total) by mouth daily. 90 tablet 2  . LORazepam (ATIVAN) 2 MG tablet TAKE 3 TABLETS BY MOUTH EVERY MORNING OR AS DIRECTED BY MD 150 tablet 5  . Multiple Vitamin (MULTIVITAMIN)  tablet Take 1 tablet by mouth daily.      . potassium chloride (K-DUR) 10 MEQ tablet Take 2 tablets (20 mEq total) by mouth 2 (two) times daily. 360 tablet 3  . Testosterone (ANDROGEL PUMP TD) Place onto the skin. Apply 6 pumps topically once a day or as directed by Dr. Buddy Duty.    . vitamin B-12 (CYANOCOBALAMIN) 500 MCG tablet Take 500 mcg by mouth daily.    Marland Kitchen zinc gluconate 50 MG tablet Take 50 mg by mouth daily.     No current facility-administered medications for this visit.   Family History  Problem Relation Age of Onset   . Hypertension Mother   . Hypertension Father   . Hyperlipidemia Father   . Prostate cancer Father   . Diabetes type I Sister     Died of complications of diabetes in her 64's   Social History   Social History  . Marital Status: Single    Spouse Name: N/A  . Number of Children: N/A  . Years of Education: N/A   Social History Main Topics  . Smoking status: Never Smoker   . Smokeless tobacco: Never Used  . Alcohol Use: No  . Drug Use: No  . Sexual Activity: Not on file   Other Topics Concern  . Not on file   Social History Narrative   Lives with parents.  Parents retired. Father or mother take him to appointments.  Mother sends notes if she can not take him.   Review of Systems: Review of Systems  Constitutional: Positive for fever. Negative for chills and malaise/fatigue.  Respiratory: Positive for cough. Negative for hemoptysis, sputum production, shortness of breath and wheezing.   Cardiovascular: Negative for chest pain and leg swelling.    Objective:  Physical Exam: Filed Vitals:   12/08/15 1333  BP: 136/73  Pulse: 91  Temp: 98.4 F (36.9 C)  TempSrc: Oral  Height: 5\' 8"  (1.727 m)  Weight: 212 lb 12.8 oz (96.525 kg)  SpO2: 100%   Physical Exam  Constitutional: He is well-developed, well-nourished, and in no distress.  Eyes: Conjunctivae are normal.  Cardiovascular: Normal rate and regular rhythm.   Pulmonary/Chest: Effort normal and breath sounds normal. No respiratory distress. He has no wheezes. He has no rales.  Musculoskeletal: He exhibits no edema.  Neurological: He is alert.  Nursing note and vitals reviewed.    Assessment & Plan:  Case discussed and patient seen with Dr. Eppie Gibson  Cough A: Cough  P: Given patient's history of PNA, elevated hemidiaphram and subjective fevers will get a CXR to evaluate for PNA. - Will treat with cough expectorant syrup guaifenesin-codeine. - Refilled albuterol inhaler  Panhypopituitarism (diabetes  insipidus/anterior pituitary deficiency) HPI: Mom reports she has doubled his dose of hydrocortisone  A: Panhypopituitarism  P: Reinforced hydrocortisone dosing.  Elevated hemidiaphragm A: Elevated left hemidiaphragm  P: Additional risk factor for development of PNA,  Obtaining CXR.     Medications Ordered Meds ordered this encounter  Medications  . albuterol (PROAIR HFA) 108 (90 Base) MCG/ACT inhaler    Sig: Inhale 1-2 puffs into the lungs every 6 (six) hours as needed.    Dispense:  1 Inhaler    Refill:  2  . Guaifenesin-Codeine 200-20 MG/5ML LIQD    Sig: Take 5 mLs by mouth every 6 (six) hours as needed.    Dispense:  473 mL    Refill:  0   Other Orders Orders Placed This Encounter  Procedures  . DG  Chest 2 View    Standing Status: Future     Number of Occurrences:      Standing Expiration Date: 02/04/2017    Order Specific Question:  Reason for Exam (SYMPTOM  OR DIAGNOSIS REQUIRED)    Answer:  cough, subjective fevers, history of PNA    Order Specific Question:  Preferred imaging location?    Answer:  [facility]   Follow Up: Return if symptoms worsen or fail to improve.

## 2015-12-08 NOTE — Assessment & Plan Note (Signed)
A: Elevated left hemidiaphragm  P: Additional risk factor for development of PNA,  Obtaining CXR.

## 2015-12-08 NOTE — Progress Notes (Signed)
I saw and evaluated the patient.  I personally confirmed the key portions of Dr. Jodene Nam history and exam and reviewed pertinent patient test results.  I personally reviewed the CXR which demonstrated the chronically elevated left hemidiaphragm but no infiltrate or effusions.  The assessment, diagnosis, and plan were formulated together and I agree with the documentation in the resident's note.

## 2015-12-27 ENCOUNTER — Other Ambulatory Visit: Payer: Self-pay | Admitting: Internal Medicine

## 2015-12-27 DIAGNOSIS — F4322 Adjustment disorder with anxiety: Secondary | ICD-10-CM

## 2015-12-27 DIAGNOSIS — G40909 Epilepsy, unspecified, not intractable, without status epilepticus: Secondary | ICD-10-CM

## 2015-12-28 NOTE — Telephone Encounter (Signed)
Called to pharm 

## 2016-03-04 ENCOUNTER — Encounter (HOSPITAL_COMMUNITY): Payer: Self-pay

## 2016-03-04 ENCOUNTER — Ambulatory Visit (HOSPITAL_COMMUNITY)
Admission: EM | Admit: 2016-03-04 | Discharge: 2016-03-04 | Disposition: A | Discharge status: Home / Self Care | Payer: Medicare Other | Attending: Emergency Medicine | Admitting: Emergency Medicine

## 2016-03-04 ENCOUNTER — Ambulatory Visit (INDEPENDENT_AMBULATORY_CARE_PROVIDER_SITE_OTHER): Payer: Medicare Other

## 2016-03-04 DIAGNOSIS — S4992XA Unspecified injury of left shoulder and upper arm, initial encounter: Secondary | ICD-10-CM

## 2016-03-04 DIAGNOSIS — M25512 Pain in left shoulder: Secondary | ICD-10-CM | POA: Diagnosis not present

## 2016-03-04 MED ORDER — MELOXICAM 15 MG PO TABS: 15.0000 mg | ORAL_TABLET | Freq: Every day | ORAL | Status: DC

## 2016-03-04 NOTE — ED Provider Notes (Signed)
CSN: [id number]     Arrival date & time 03/04/16  1951 History   First MD Initiated Contact with Patient 03/04/16 1959     Chief Complaint  Patient presents with  . Fall   (Consider location/radiation/quality/duration/timing/severity/associated sxs/prior Treatment) HPI  He is a 35 year old man here with his parents for evaluation of left shoulder pain.  History is obtained from the patient and his parents. He does have some mental delay from a brain cancer.  He fell on the steps this morning, landing on his left shoulder.  Initially, he did not complain of any pain.  This afternoon, he appeared to be favoring the left shoulder and did tell his parents that he was having pain in the anterior shoulder. It is worse with arm movements, particularly abduction beyond 45. He has taken some Aleve with some improvement.  Past Medical History  Diagnosis Date  . 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  Complicated by left sided motor tic disorder and panhypopituitarism.   . 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  Includes central diabetes insipidus, secondary hypothyroidism, secondary adrenal insufficiency, and secondary hypogonadism.   . Seizure disorder ([place]) 08/08/2006    Predated brain tumor.  Generalized seizure ocurred after running out of lorazepam post-tumor.   . Attention deficit disorder 08/08/2006  . 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 Treated with alendronate from 2009-2016. DEXA (03/16/2008): L Spine T -2.0. L fem neck T -0.9, R fem neck T -1.9.  DEXA (10/15/2014): L Spine T -1.3, L fem neck T -0.6, R fem neck T -1.8.  Bisphosphonate holiday 06/2015.  Repeat DEXA scan in 10/2016 to reassess.   . Bilateral  leg cramps 11/25/2006  . Hyperlipidemia 08/08/2006  . Elevated hemidiaphragm 07/09/2015    Left, presumably after portacath placement.   . Obesity (BMI 30.0-34.9) 07/09/2015  . Anxiety   . Blood transfusion without reported diagnosis 1997    Chemotherapy associated  . Perennial allergic rhinitis 07/09/2015   Past Surgical History  Procedure Laterality Date  . Brain biopsy  1997  . Csf shunt  1997  . Portacath placement     Family History  Problem Relation Age of Onset  . Hypertension Mother   . Hypertension Father   . Hyperlipidemia Father   . Prostate cancer Father   . Diabetes type I Sister     Died of complications of diabetes in her 22's   Social History  Substance Use Topics  . Smoking status: Never Smoker   . Smokeless tobacco: Never Used  . Alcohol Use: No    Review of Systems s in history of present illness Allergies  Review of patient's allergies indicates no known allergies.  Home Medications   Prior to Admission medications   Medication Sig Start Date End Date Taking? Authorizing Provider  Ascorbic Acid (VITAMIN C) 1000 MG tablet Take 1,000 mg by mouth daily.   Yes Historical Provider, MD  B Complex Vitamins (VITAMIN B COMPLEX) CAPS Take 1 capsule by mouth once a day   Yes Historical Provider, MD  cyclobenzaprine (FLEXERIL) 10 MG tablet Take 1 tablet (10 mg total) by mouth 3 (three) times daily as needed for muscle spasms. 07/09/15  Yes Oval Linsey, MD  hydrocortisone (CORTEF) 10 MG tablet Take 1.5 tablets (15 mg total) by mouth  daily. Double the dose on sick days 07/09/15  Yes Oval Linsey, MD  levothyroxine (SYNTHROID, LEVOTHROID) 112 MCG tablet Take 1 tablet (112 mcg total) by mouth daily. 09/08/13  Yes Bertha Stakes, MD  LORazepam (ATIVAN) 2 MG tablet TAKE 3 TABLETS EVERY MORNING AS DIRECTED BY DOCTOR 12/28/15  Yes Oval Linsey, MD  Multiple Vitamin (MULTIVITAMIN) tablet Take 1 tablet by mouth daily.     Yes Historical Provider, MD  potassium chloride  (K-DUR) 10 MEQ tablet Take 2 tablets (20 mEq total) by mouth 2 (two) times daily. 07/09/15  Yes Oval Linsey, MD  Testosterone (ANDROGEL PUMP TD) Place onto the skin. Apply 6 pumps topically once a day or as directed by Dr. Buddy Duty.   Yes Historical Provider, MD  zinc gluconate 50 MG tablet Take 50 mg by mouth daily.   Yes Historical Provider, MD  albuterol (PROAIR HFA) 108 (90 Base) MCG/ACT inhaler Inhale 1-2 puffs into the lungs every 6 (six) hours as needed. 12/08/15   Lucious Groves, DO  calcium-vitamin D (OSCAL 500/200 D-3) 500-200 MG-UNIT per tablet Take 1 tablet by mouth 2 (two) times daily. 02/09/15 02/09/16  Bertha Stakes, MD  DDAVP 0.01 % solution Place 1 spray (10 mcg total) into the nose 4 (four) times daily. 07/09/15   Oval Linsey, MD  fluticasone (FLONASE) 50 MCG/ACT nasal spray Place 2 sprays into the nose at bedtime.      Historical Provider, MD  Guaifenesin-Codeine 200-20 MG/5ML LIQD Take 5 mLs by mouth every 6 (six) hours as needed. 12/08/15   Lucious Groves, DO  meloxicam (MOBIC) 15 MG tablet Take 1 tablet (15 mg total) by mouth daily. 03/04/16   Melony Overly, MD  vitamin B-12 (CYANOCOBALAMIN) 500 MCG tablet Take 500 mcg by mouth daily.    Historical Provider, MD   Meds Ordered and Administered this Visit  Medications - No data to display  BP 140/75 mmHg  Pulse 95  Temp(Src) 97.9 F (36.6 C) (Oral)  SpO2 99% No data found.   Physical Exam  Constitutional: He is oriented to person, place, and time. He appears well-developed and well-nourished. No distress.  Cardiovascular: Normal rate.   Pulmonary/Chest: Effort normal.  Musculoskeletal:  Left shoulder: No obvious asymmetry. He is tender over the anterior acromion. Physical exam limited due to discomfort. He has pain with passive abduction beyond 90. 2+ radial pulse.  Neurological: He is alert and oriented to person, place, and time.    ED Course  Procedures (including critical care time)  Labs Review Labs Reviewed - No  data to display  Imaging Review Dg Shoulder Left  03/04/2016  CLINICAL DATA:  Fall, left shoulder/ clavicle pain EXAM: LEFT SHOULDER - 2+ VIEW COMPARISON:  11/14/2007 FINDINGS: No fracture or dislocation is seen. The joint spaces are preserved. The visualized soft tissues are unremarkable. Visualized left lung is clear. IMPRESSION: No fracture or dislocation is seen. Electronically Signed   By: Julian Hy M.D.   On: 03/04/2016 20:27      MDM   1. Shoulder injury, left, initial encounter    Likely bruising and strain. Treat with ice and meloxicam. Follow-up with orthopedics if not improving in 2 weeks.    Melony Overly, MD 03/04/16 2035

## 2016-03-04 NOTE — Discharge Instructions (Signed)
Your x-ray is normal. You likely have some bruising and strain of your rotator cuff. Apply ice at least 3 times a day for 20 minutes. Take meloxicam daily for 1 week, then as needed. This should gradually improve over the next 2 weeks. If it is not getting better, please follow-up with orthopedics.

## 2016-03-04 NOTE — ED Notes (Signed)
Patient states he fell down the stairs this morning 03/04/2016 and hurt his left shoulder, pt has taken 2 Aleve for pain. No acute distress Parents at bedside

## 2016-03-10 DIAGNOSIS — E230 Hypopituitarism: Secondary | ICD-10-CM | POA: Diagnosis not present

## 2016-03-23 DIAGNOSIS — E230 Hypopituitarism: Secondary | ICD-10-CM | POA: Diagnosis not present

## 2016-03-23 DIAGNOSIS — E232 Diabetes insipidus: Secondary | ICD-10-CM | POA: Diagnosis not present

## 2016-03-23 DIAGNOSIS — D649 Anemia, unspecified: Secondary | ICD-10-CM | POA: Diagnosis not present

## 2016-03-23 DIAGNOSIS — Z5181 Encounter for therapeutic drug level monitoring: Secondary | ICD-10-CM | POA: Diagnosis not present

## 2016-03-30 ENCOUNTER — Other Ambulatory Visit: Payer: Self-pay | Admitting: Internal Medicine

## 2016-07-05 ENCOUNTER — Other Ambulatory Visit: Payer: Self-pay | Admitting: *Deleted

## 2016-07-05 DIAGNOSIS — R252 Cramp and spasm: Secondary | ICD-10-CM

## 2016-07-06 MED ORDER — POTASSIUM CHLORIDE ER 10 MEQ PO TBCR: 20.0000 meq | EXTENDED_RELEASE_TABLET | Freq: Two times a day (BID) | ORAL | 3 refills | Status: DC

## 2016-07-12 ENCOUNTER — Other Ambulatory Visit: Payer: Self-pay | Admitting: Internal Medicine

## 2016-07-12 DIAGNOSIS — F4322 Adjustment disorder with anxiety: Secondary | ICD-10-CM

## 2016-07-12 DIAGNOSIS — G40909 Epilepsy, unspecified, not intractable, without status epilepticus: Secondary | ICD-10-CM

## 2016-07-18 ENCOUNTER — Other Ambulatory Visit: Payer: Self-pay | Admitting: Internal Medicine

## 2016-07-18 DIAGNOSIS — G40909 Epilepsy, unspecified, not intractable, without status epilepticus: Secondary | ICD-10-CM

## 2016-07-18 DIAGNOSIS — F4322 Adjustment disorder with anxiety: Secondary | ICD-10-CM

## 2016-07-18 NOTE — Telephone Encounter (Signed)
rx phoned in.Nicholas Katzenberger Cassady9/5/20178:35 AM

## 2016-08-01 ENCOUNTER — Other Ambulatory Visit: Payer: Self-pay | Admitting: Internal Medicine

## 2016-08-01 DIAGNOSIS — E230 Hypopituitarism: Secondary | ICD-10-CM

## 2016-09-27 DIAGNOSIS — E230 Hypopituitarism: Secondary | ICD-10-CM | POA: Diagnosis not present

## 2016-09-27 DIAGNOSIS — E232 Diabetes insipidus: Secondary | ICD-10-CM | POA: Diagnosis not present

## 2016-09-27 DIAGNOSIS — E7800 Pure hypercholesterolemia, unspecified: Secondary | ICD-10-CM | POA: Diagnosis not present

## 2016-09-27 DIAGNOSIS — D649 Anemia, unspecified: Secondary | ICD-10-CM | POA: Diagnosis not present

## 2016-09-27 DIAGNOSIS — Z5181 Encounter for therapeutic drug level monitoring: Secondary | ICD-10-CM | POA: Diagnosis not present

## 2016-10-18 ENCOUNTER — Other Ambulatory Visit: Payer: Self-pay | Admitting: Internal Medicine

## 2016-10-18 DIAGNOSIS — R252 Cramp and spasm: Secondary | ICD-10-CM

## 2016-10-20 DIAGNOSIS — Z23 Encounter for immunization: Secondary | ICD-10-CM | POA: Diagnosis not present

## 2016-10-26 DIAGNOSIS — Z5181 Encounter for therapeutic drug level monitoring: Secondary | ICD-10-CM | POA: Diagnosis not present

## 2016-10-26 DIAGNOSIS — E232 Diabetes insipidus: Secondary | ICD-10-CM | POA: Diagnosis not present

## 2016-10-26 DIAGNOSIS — E230 Hypopituitarism: Secondary | ICD-10-CM | POA: Diagnosis not present

## 2016-11-23 ENCOUNTER — Telehealth: Payer: Self-pay | Admitting: Internal Medicine

## 2016-11-23 NOTE — Telephone Encounter (Signed)
APT. REMINDER CALL, LMTCB °

## 2016-11-24 ENCOUNTER — Ambulatory Visit (INDEPENDENT_AMBULATORY_CARE_PROVIDER_SITE_OTHER): Payer: Medicare Other | Admitting: Internal Medicine

## 2016-11-24 ENCOUNTER — Encounter: Payer: Self-pay | Admitting: Internal Medicine

## 2016-11-24 VITALS — BP 111/86 | HR 100 | Temp 98.3°F | Wt 223.0 lb

## 2016-11-24 DIAGNOSIS — Z6833 Body mass index (BMI) 33.0-33.9, adult: Secondary | ICD-10-CM

## 2016-11-24 DIAGNOSIS — G4701 Insomnia due to medical condition: Secondary | ICD-10-CM

## 2016-11-24 DIAGNOSIS — Z23 Encounter for immunization: Secondary | ICD-10-CM | POA: Diagnosis not present

## 2016-11-24 DIAGNOSIS — J3089 Other allergic rhinitis: Secondary | ICD-10-CM | POA: Diagnosis not present

## 2016-11-24 DIAGNOSIS — G4762 Sleep related leg cramps: Secondary | ICD-10-CM | POA: Diagnosis not present

## 2016-11-24 DIAGNOSIS — E230 Hypopituitarism: Secondary | ICD-10-CM | POA: Diagnosis not present

## 2016-11-24 DIAGNOSIS — G253 Myoclonus: Secondary | ICD-10-CM

## 2016-11-24 DIAGNOSIS — E669 Obesity, unspecified: Secondary | ICD-10-CM | POA: Diagnosis not present

## 2016-11-24 DIAGNOSIS — T380X5A Adverse effect of glucocorticoids and synthetic analogues, initial encounter: Secondary | ICD-10-CM

## 2016-11-24 DIAGNOSIS — G40909 Epilepsy, unspecified, not intractable, without status epilepticus: Secondary | ICD-10-CM

## 2016-11-24 DIAGNOSIS — Z79899 Other long term (current) drug therapy: Secondary | ICD-10-CM | POA: Diagnosis not present

## 2016-11-24 DIAGNOSIS — Z85841 Personal history of malignant neoplasm of brain: Secondary | ICD-10-CM

## 2016-11-24 DIAGNOSIS — Z0000 Encounter for general adult medical examination without abnormal findings: Secondary | ICD-10-CM

## 2016-11-24 DIAGNOSIS — M8580 Other specified disorders of bone density and structure, unspecified site: Secondary | ICD-10-CM | POA: Diagnosis not present

## 2016-11-24 DIAGNOSIS — R252 Cramp and spasm: Secondary | ICD-10-CM

## 2016-11-24 DIAGNOSIS — T380X5D Adverse effect of glucocorticoids and synthetic analogues, subsequent encounter: Secondary | ICD-10-CM | POA: Diagnosis not present

## 2016-11-24 MED ORDER — CALCIUM CARBONATE-VITAMIN D 500-200 MG-UNIT PO TABS: 1.0000 | ORAL_TABLET | Freq: Two times a day (BID) | ORAL | 3 refills | Status: DC

## 2016-11-24 NOTE — Progress Notes (Signed)
   Subjective:    Patient ID: Nicholas Holt, male    DOB: May 25, 1981, 36 y.o.   MRN: [id number]  HPI  Nicholas Holt is here for follow-up of panhypopituitarism, history of malignant neoplasm of the brain, seizure disorder, steroid induced osteopenia, and muscle cramps. Please see the A&P for the status of the pt's chronic medical problems.  His only new complaint today is insomnia. He states he is not tired during the day and has no problem falling asleep. Over the last 5-6 months he's been getting up spontaneously at 4:00 in the morning and has been unable to get back to sleep. This is despite taking lorazepam 6 mg by mouth at night and Flexeril 10 mg by mouth at night. He and his mother are asking if he should increase the lorazepam to 8 mg by mouth daily. Believe it or not, he states he previously was taking up to 12 mg at night. Not surprisingly, I was uncomfortable with increasing an already high dose. We discussed alternatives to try before increasing the lorazepam for the insomnia. Unfortunately, with the late awakening, a short acting sleeping aide would not likely improve this symptom. While discussing his insomnia I asked him specifically about issues of sleep hygiene which do not appear to be a problem. He also denies any depression or anxiety. There have been no changes in his medications or habits either.  Review of Systems  Constitutional: Negative for activity change, appetite change, fatigue and unexpected weight change.  HENT: Negative for congestion, rhinorrhea, sinus pain and sinus pressure.   Respiratory: Negative for chest tightness and shortness of breath.   Cardiovascular: Negative for chest pain, palpitations and leg swelling.  Gastrointestinal: Negative for abdominal distention, abdominal pain, constipation, diarrhea, nausea and vomiting.  Musculoskeletal: Negative for arthralgias and joint swelling.  Skin: Negative for rash and wound.  Neurological: Negative for  seizures, syncope, weakness, numbness and headaches.  Psychiatric/Behavioral: Positive for sleep disturbance. Negative for agitation and dysphoric mood. The patient has insomnia. The patient is not nervous/anxious.       Objective:   Physical Exam  Constitutional: He is oriented to person, place, and time. He appears well-developed and well-nourished. No distress.  Eyes: Conjunctivae are normal. Right eye exhibits no discharge. Left eye exhibits no discharge. No scleral icterus.  Cardiovascular: Normal rate, regular rhythm and normal heart sounds.  Exam reveals no gallop and no friction rub.   No murmur heard. Pulmonary/Chest: Effort normal and breath sounds normal. No respiratory distress. He has no wheezes. He has no rales.  Abdominal: Soft. He exhibits no distension. There is no tenderness. There is no rebound and no guarding.  Musculoskeletal: Normal range of motion. He exhibits no edema, tenderness or deformity.  Neurological: He is alert and oriented to person, place, and time. He exhibits normal muscle tone. Coordination abnormal.  Skin: Skin is warm and dry. No rash noted. He is not diaphoretic.  Psychiatric: He has a normal mood and affect. His behavior is normal. Judgment and thought content normal.  Nursing note and vitals reviewed.     Assessment & Plan:   Please see problem oriented charting.

## 2016-11-24 NOTE — Assessment & Plan Note (Signed)
Assessment  He continues to have leg cramps at night although they are improved with the increase in the Flexeril to 10 mg by mouth. When he is awoken occasionally at night he states he has no problem getting to sleep soon thereafter.  Plan  We will increase the Flexeril to 20 mg by mouth at night as needed for nocturnal cramping. The idea here is to possibly increase his ability to stay asleep throughout the night rather than going up on the lorazepam dose at this time. It is also hoped, with this higher dose, that his nocturnal cramps will be better controlled. Of note, he does not take the Flexeril at any other time during the day. We will reassess both his nocturnal cramps and his insomnia on this dose at the follow-up visit.

## 2016-11-24 NOTE — Assessment & Plan Note (Signed)
Assessment  Although his weight is up 11 pounds since the last visit his weight is actually within range over the last several years. He did not follow the advice to watch his intake, especially decreasing the fast food.  Plan  The importance of controlling his weight was again reviewed and a suggestion to watch the fast food was made. We will reassess the weight at the follow-up visit.

## 2016-11-24 NOTE — Assessment & Plan Note (Signed)
He states he received the flu vaccination this year at Dr. Cindra Eves office. We will call Dr. Cindra Eves office to get the date that he was administered for our records. He was therefore given the Prevnar 13 Pneumovax today. He will be due for the pneumococcal 23 vaccination in one year and then at age 36 will need a booster. He is otherwise up-to-date on his preventative health maintenance.

## 2016-11-24 NOTE — Assessment & Plan Note (Signed)
Assessment  He began a bisphosphonate holiday at the last visit. The plan was to reassess his bone density with a repeat DEXA scan to be ordered at this clinic visit. He has no signs or symptoms of and bone fragility fracture.  Plan  A DEXA scan has been ordered and will be followed up when resulted. If there is no change in his bone density since stopping the bisphosphonate we will continue the holiday. Otherwise we may restart the bisphosphonate therapy.

## 2016-11-24 NOTE — Assessment & Plan Note (Signed)
Assessment  The cause for his awakening at 4 in the morning and in ability to get back to sleep are unclear. It should be noted that he takes lorazepam 6 mg by mouth at night which she has done for quite some time. When this is been weaned in the past he's had recurrent seizures. He and his mother were asking to increase the lorazepam dose.  Plan  I'm uncomfortable with increasing the lorazepam dose as the initial strategy and as noted above have tried to improve the control of his nocturnal cramping by increasing the Flexeril to 20 mg by mouth at night in hopes of both decreasing the nocturnal cramping and improving his sleep throughout the night. If this is ineffective in controlling his insomnia beginning at 4 AM after 2 weeks he and his mother will call and we will increase the lorazepam to 8 mg at night at that time. Although a rather high dose, given the very long time he has been on this and higher doses I suspect he has some tolerance to the benzodiazepines.

## 2016-11-24 NOTE — Assessment & Plan Note (Signed)
Assessment  He has had no seizure activity since the last clinic visit on the lorazepam 6 mg by mouth at night.  Plan  We will continue the lorazepam at 6 mg by mouth at night and reassess for evidence of seizures in the interim.

## 2016-11-24 NOTE — Patient Instructions (Signed)
It was good to see you again.  1) We increased your flexeril to just 20 mg at night.  This may help both the cramps even more and the early awakening.  If you still awake at night early call me in 2 weeks and we will increase the nightly lorazepam back up to 4 tablets.  2) Keep taking the other medications as you are.  3) I ordered a DEXA scan to check on your bone health since we stopped the alendronate last year.  4) We gave you the pneumonia shot today.  I will see you in 1 year, sooner if necessary.

## 2016-11-24 NOTE — Assessment & Plan Note (Signed)
Assessment  He does not have symptoms of perennial allergic rhinitis while taking the Flonase.  Plan  We will continue the Flonase and reassess for symptoms of his allergic nidus at the follow-up visit.

## 2016-11-24 NOTE — Assessment & Plan Note (Signed)
Assessment  He has no headaches, nausea, vomiting, dizziness, numbness, weakness, or other neurologic changes since the last clinic visit. There is no data as to the appropriate follow-up, but his diagnosis was 21 years ago and he has no clinical evidence of recurrence.  Plan  At this point there is no indication for an MRI. We will continue to assess him clinically and if there are any neurologic changes we will obtain an MRI at that time. Of note, he has considerable claustrophobia, thus obtaining an MRI is not without risk and discomfort.

## 2016-11-24 NOTE — Assessment & Plan Note (Signed)
Assessment  He does not have any signs or symptoms consistent with untreated components of panhypopituitarism. He is followed closely by Dr. Buddy Duty and was seen within the last month with appropriate hormone levels.  Plan  He is to continue the DDAVP 1 spray (10 g) 4 times daily, hydrocortisone 15 mg by mouth daily, Synthroid 112 g by mouth daily, and AndroGel 6 pumps topically daily. He will continue to follow closely with Dr. Buddy Duty. Given that extensive blood work was done recently by Dr. Buddy Duty none will be ordered today. We will reassess for symptoms at the follow-up visit.

## 2016-12-12 ENCOUNTER — Ambulatory Visit
Admission: RE | Admit: 2016-12-12 | Discharge: 2016-12-12 | Disposition: A | Discharge status: Home / Self Care | Payer: Medicare Other | Source: Ambulatory Visit | Attending: Internal Medicine | Admitting: Internal Medicine

## 2016-12-12 DIAGNOSIS — M8589 Other specified disorders of bone density and structure, multiple sites: Secondary | ICD-10-CM | POA: Diagnosis not present

## 2016-12-12 DIAGNOSIS — T380X5A Adverse effect of glucocorticoids and synthetic analogues, initial encounter: Principal | ICD-10-CM

## 2016-12-12 DIAGNOSIS — M8580 Other specified disorders of bone density and structure, unspecified site: Secondary | ICD-10-CM

## 2016-12-15 ENCOUNTER — Other Ambulatory Visit: Payer: Self-pay | Admitting: Internal Medicine

## 2016-12-15 DIAGNOSIS — R252 Cramp and spasm: Secondary | ICD-10-CM

## 2017-01-20 ENCOUNTER — Other Ambulatory Visit: Payer: Self-pay | Admitting: Internal Medicine

## 2017-01-20 DIAGNOSIS — F4322 Adjustment disorder with anxiety: Secondary | ICD-10-CM

## 2017-01-20 DIAGNOSIS — G40909 Epilepsy, unspecified, not intractable, without status epilepticus: Secondary | ICD-10-CM

## 2017-01-23 NOTE — Telephone Encounter (Signed)
rx phoned into pharmacy.Nicholas Holt, Nicholas Cassady3/13/20183:48 PM

## 2017-02-19 ENCOUNTER — Telehealth: Payer: Self-pay | Admitting: Internal Medicine

## 2017-02-19 DIAGNOSIS — H547 Unspecified visual loss: Secondary | ICD-10-CM

## 2017-02-19 NOTE — Telephone Encounter (Signed)
Pt's Mother asking for a Eye Exam referral.  She state's he thinks his eyes are getting worse.  He used to go to [facility] but would like to go to a new office if possible.  Please Advise.

## 2017-02-19 NOTE — Telephone Encounter (Signed)
This is a reasonable request and I have just placed an ambulatory referral to Ophthalmology.  All that is needed now is an appointment.  Thanks.

## 2017-03-05 DIAGNOSIS — E232 Diabetes insipidus: Secondary | ICD-10-CM | POA: Diagnosis not present

## 2017-03-05 LAB — HM DIABETES EYE EXAM

## 2017-03-13 ENCOUNTER — Telehealth: Payer: Self-pay

## 2017-03-13 NOTE — Telephone Encounter (Signed)
Needs to speak with a nurse about ankle pain. Please call back.

## 2017-03-16 NOTE — Telephone Encounter (Signed)
Called pt - no answer; no voicemail - unable to leave message. 

## 2017-03-19 ENCOUNTER — Ambulatory Visit (INDEPENDENT_AMBULATORY_CARE_PROVIDER_SITE_OTHER): Payer: Medicare Other | Admitting: Internal Medicine

## 2017-03-19 ENCOUNTER — Ambulatory Visit (HOSPITAL_COMMUNITY)
Admission: RE | Admit: 2017-03-19 | Discharge: 2017-03-19 | Disposition: A | Discharge status: Home / Self Care | Payer: Medicare Other | Source: Ambulatory Visit | Attending: Internal Medicine | Admitting: Internal Medicine

## 2017-03-19 ENCOUNTER — Encounter (INDEPENDENT_AMBULATORY_CARE_PROVIDER_SITE_OTHER): Payer: Self-pay

## 2017-03-19 DIAGNOSIS — M79672 Pain in left foot: Secondary | ICD-10-CM | POA: Diagnosis present

## 2017-03-19 DIAGNOSIS — M79605 Pain in left leg: Secondary | ICD-10-CM | POA: Diagnosis not present

## 2017-03-19 DIAGNOSIS — Z8739 Personal history of other diseases of the musculoskeletal system and connective tissue: Secondary | ICD-10-CM | POA: Diagnosis not present

## 2017-03-19 DIAGNOSIS — M25572 Pain in left ankle and joints of left foot: Secondary | ICD-10-CM | POA: Diagnosis not present

## 2017-03-19 DIAGNOSIS — M79662 Pain in left lower leg: Secondary | ICD-10-CM | POA: Diagnosis not present

## 2017-03-19 DIAGNOSIS — Z85841 Personal history of malignant neoplasm of brain: Secondary | ICD-10-CM | POA: Diagnosis not present

## 2017-03-19 DIAGNOSIS — M7989 Other specified soft tissue disorders: Secondary | ICD-10-CM | POA: Diagnosis not present

## 2017-03-19 MED ORDER — DICLOFENAC SODIUM 1 % TD GEL: 4.0000 g | Freq: Four times a day (QID) | TRANSDERMAL | 0 refills | Status: DC

## 2017-03-19 NOTE — Patient Instructions (Signed)
Nicholas Holt,  It was a pleasure to see you today. I am sorry to hear about your foot pain. Please go upstairs to the first floor radiology department to have your xrays done. I will call you with the results. If you have any questions or concerns, call our clinic at [phone] or after hours call [phone] and ask for the internal medicine resident on call. Thank you!  - Dr. Philipp Ovens

## 2017-03-19 NOTE — Assessment & Plan Note (Addendum)
Patient is presenting today with 2 weeks of left ankle/leg pain. He denies any injury to the leg. He reports pain that is worse with weight bearing activity. On exam, he has tenderness to palpation over his distal left tibia just superior to his medial malleolus. There is trace nonpitting edema over the area but no obvious effusion, ecchymoses, or bony deformity. Patient has a history of a brain neoplasm with left-sided deficits. Mom reports a left foot drop at baseline and patient drags his left foot while walking. Of note, patient has a history of steroid induced osteopenia for which he was on bisphosphonate therapy 7 years. This was held in August 2016. He underwent a repeat DEXA scan in January 2018 that showed stable z-scores despite bisphosphonate holiday. He has not restarted the medication since. We will obtain x-rays today to rule out fragility fracture.  -- F/u Left ankle, tibia/fibula xrays  -- Voltaren gel 4 times daily prn for pain   ADDENDUM: Xrays without evidence of fracture. Called patient's father and updated him on the results. Informed him that it is likely a ligamentous strain and should heal on it's own over the next 4-6 weeks. Instructed him to return to clinic if pain and swelling persists.

## 2017-03-19 NOTE — Progress Notes (Signed)
   CC: Foot pain   HPI:  [name] is a 36 y.o. male with past medical history outlined below here for left foot pain. For the details of today's visit, please refer to the assessment and plan.  Past Medical History:  Diagnosis Date  . Anxiety   . Attention deficit disorder 08/08/2006  . Bilateral leg cramps 11/25/2006  . Blood transfusion without reported diagnosis 1997   Chemotherapy associated  . 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   . 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  Complicated by left sided motor tic disorder and panhypopituitarism.   Marland Kitchen Hyperlipidemia 08/08/2006  . Obesity (BMI 30.0-34.9) 07/09/2015  . 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  Includes central diabetes insipidus, secondary hypothyroidism, secondary adrenal insufficiency, and secondary hypogonadism.   Marland Kitchen Perennial allergic rhinitis 07/09/2015  . Seizure disorder ([place]) 08/08/2006   Predated brain tumor.  Generalized seizure ocurred after running out of lorazepam post-tumor.   . 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 Treated with alendronate from 2009-2016. DEXA (03/16/2008): L Spine T -2.0. L fem neck T -0.9, R fem neck T -1.9.  DEXA (10/15/2014): L Spine T -1.3, L fem neck T -0.6, R fem neck T -1.8.  Bisphosphonate holiday 06/2015.  Repeat DEXA scan in 10/2016 to reassess.     Review of Systems:  All pertinents listed in HPI, otherwise negative  Physical Exam:  Vitals:   03/19/17 1338  BP: 129/72  Pulse: 80  Temp: 98.3 F (36.8 C)  TempSrc: Oral  SpO2: 99%  Weight: 226 lb 12.8 oz (102.9 kg)  Height: 5\' 8"  (1.727 m)    Constitutional: NAD, appears  comfortable Cardiovascular: RRR, no murmurs, rubs, or gallops.  Pulmonary/Chest: CTAB Extremities: Warm and well perfused. Distal pulses intact. Trace left lower extremity non pitting edema. No ecchymosis or obviously deformity. Pinpoint tenderness to palpation over his left distal tibia just superior to his medial malleolus.  Neurological: A&Ox3, CN II - XII grossly intact.   Assessment & Plan:   See Encounters Tab for problem based charting.  Patient discussed with Dr. Eppie Gibson

## 2017-03-19 NOTE — Progress Notes (Signed)
Case discussed with Dr. Philipp Ovens at the time of the visit. We reviewed the resident's history and exam and pertinent patient test results. I agree with the assessment, diagnosis, and plan of care documented in the resident's note.  The x-ray has been completed although the official read is pending at this time.

## 2017-03-20 ENCOUNTER — Telehealth: Payer: Self-pay | Admitting: *Deleted

## 2017-03-20 NOTE — Telephone Encounter (Signed)
Call made to pharmacy to see which plan pt is currently using for drug coverage, pt uses medicare.  Will give to PA cordinater for reivew.[name]5/8/20184:34 PM

## 2017-03-20 NOTE — Telephone Encounter (Signed)
Pt's mother calls and states that father thought pt had a fracture, I read her word for word your note, then gave her comfort measures and encouraged her call for f/u if swelling or pain continued unchanged or increased, she verbalized understanding Also sending kayeg. possible PA for voltaren gel

## 2017-04-24 ENCOUNTER — Encounter: Payer: Self-pay | Admitting: *Deleted

## 2017-04-25 DIAGNOSIS — E230 Hypopituitarism: Secondary | ICD-10-CM | POA: Diagnosis not present

## 2017-04-25 DIAGNOSIS — Z5181 Encounter for therapeutic drug level monitoring: Secondary | ICD-10-CM | POA: Diagnosis not present

## 2017-05-01 DIAGNOSIS — E232 Diabetes insipidus: Secondary | ICD-10-CM | POA: Diagnosis not present

## 2017-05-01 DIAGNOSIS — Z125 Encounter for screening for malignant neoplasm of prostate: Secondary | ICD-10-CM | POA: Diagnosis not present

## 2017-05-01 DIAGNOSIS — E230 Hypopituitarism: Secondary | ICD-10-CM | POA: Diagnosis not present

## 2017-05-01 DIAGNOSIS — Z5181 Encounter for therapeutic drug level monitoring: Secondary | ICD-10-CM | POA: Diagnosis not present

## 2017-05-10 ENCOUNTER — Other Ambulatory Visit: Payer: Self-pay | Admitting: Internal Medicine

## 2017-05-10 DIAGNOSIS — E230 Hypopituitarism: Secondary | ICD-10-CM

## 2017-05-10 MED ORDER — TESTOSTERONE 20.25 MG/ACT (1.62%) TD GEL: 20.2500 mg | Freq: Every day | TRANSDERMAL | 3 refills | Status: AC

## 2017-06-13 ENCOUNTER — Other Ambulatory Visit: Payer: Self-pay | Admitting: Internal Medicine

## 2017-06-13 DIAGNOSIS — R252 Cramp and spasm: Secondary | ICD-10-CM

## 2017-07-19 ENCOUNTER — Other Ambulatory Visit: Payer: Self-pay | Admitting: Internal Medicine

## 2017-07-19 DIAGNOSIS — E230 Hypopituitarism: Secondary | ICD-10-CM

## 2017-07-25 ENCOUNTER — Other Ambulatory Visit: Payer: Self-pay | Admitting: Internal Medicine

## 2017-07-25 DIAGNOSIS — G40909 Epilepsy, unspecified, not intractable, without status epilepticus: Secondary | ICD-10-CM

## 2017-07-25 DIAGNOSIS — F4322 Adjustment disorder with anxiety: Secondary | ICD-10-CM

## 2017-07-25 NOTE — Telephone Encounter (Signed)
Lorazepam called in to [place].

## 2017-09-06 ENCOUNTER — Telehealth: Payer: Self-pay | Admitting: Internal Medicine

## 2017-09-06 DIAGNOSIS — R05 Cough: Secondary | ICD-10-CM

## 2017-09-06 DIAGNOSIS — R059 Cough, unspecified: Secondary | ICD-10-CM

## 2017-09-06 MED ORDER — GUAIFENESIN ER 600 MG PO TB12: ORAL_TABLET | ORAL | 0 refills | Status: DC

## 2017-09-06 NOTE — Telephone Encounter (Signed)
Have spoken to pt's mother, she said to tell dr Eppie Gibson thank you

## 2017-09-06 NOTE — Telephone Encounter (Signed)
PATIENT MOTHER CALLED HE HAS BAD COLD AND CHEST COUGH, NEEDS MEDICATION TO HELP BREAK UP CHEST CONGESTION, PLEASE GET MESSAGE TO DR. Mahnomen AT [phone]

## 2017-09-06 NOTE — Telephone Encounter (Signed)
We will prescribe guaifenesin ER 1-2 tablets ([phone] mg) PO Q12H as needed for chest congestion.  His mother will be asked to schedule an appointment in [facility] if he does not improve over the next few days or his symptoms worsen.

## 2017-10-11 DIAGNOSIS — Z23 Encounter for immunization: Secondary | ICD-10-CM | POA: Diagnosis not present

## 2017-10-16 ENCOUNTER — Encounter (INDEPENDENT_AMBULATORY_CARE_PROVIDER_SITE_OTHER): Payer: Self-pay

## 2017-10-16 ENCOUNTER — Ambulatory Visit (INDEPENDENT_AMBULATORY_CARE_PROVIDER_SITE_OTHER): Payer: Medicare Other | Admitting: Internal Medicine

## 2017-10-16 VITALS — BP 137/80 | HR 100 | Temp 97.8°F | Wt 228.7 lb

## 2017-10-16 DIAGNOSIS — B9789 Other viral agents as the cause of diseases classified elsewhere: Principal | ICD-10-CM

## 2017-10-16 DIAGNOSIS — U071 COVID-19: Secondary | ICD-10-CM | POA: Insufficient documentation

## 2017-10-16 DIAGNOSIS — J069 Acute upper respiratory infection, unspecified: Secondary | ICD-10-CM | POA: Insufficient documentation

## 2017-10-16 HISTORY — DX: COVID-19: U07.1

## 2017-10-16 MED ORDER — FLUTICASONE PROPIONATE 50 MCG/ACT NA SUSP: 1.0000 | Freq: Every day | NASAL | 0 refills | Status: DC

## 2017-10-16 NOTE — Patient Instructions (Addendum)
Thank you for allowing [place] to care for you.  For your cough and congestion: - Your symptoms are believed to be caused by a viral illness (cold) - Use Flonase in each nostril daily - Use a nasal rinse each night before bed

## 2017-10-16 NOTE — Progress Notes (Signed)
   CC: Cough  HPI:  Nicholas Holt is a 36 y.o. M with PMHx listed below presenting for cough Please see the A&P for the status of the patient's chronic medical problems.   Past Medical History:  Diagnosis Date  . Anxiety   . Attention deficit disorder 08/08/2006  . Bilateral leg cramps 11/25/2006  . Blood transfusion without reported diagnosis 1997   Chemotherapy associated  . 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   . 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  Complicated by left sided motor tic disorder and panhypopituitarism.   Marland Kitchen Hyperlipidemia 08/08/2006  . Obesity (BMI 30.0-34.9) 07/09/2015  . 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  Includes central diabetes insipidus, secondary hypothyroidism, secondary adrenal insufficiency, and secondary hypogonadism.   Marland Kitchen Perennial allergic rhinitis 07/09/2015  . Seizure disorder ([place]) 08/08/2006   Predated brain tumor.  Generalized seizure ocurred after running out of lorazepam post-tumor.   . 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 Treated with alendronate from 2009-2016. DEXA (03/16/2008): L Spine T -2.0. L fem neck T -0.9, R fem neck T -1.9.  DEXA (10/15/2014): L Spine T -1.3, L fem neck T -0.6, R fem neck T -1.8.  Bisphosphonate holiday 06/2015.  Repeat DEXA scan in 10/2016 to reassess.    Review of Systems:  Performed and negative except as otherwise indicated.  Physical Exam:  Vitals:   10/16/17 1534 10/16/17 1548  BP: (!) 177/92 137/80  Pulse: 86 100  Temp: 97.8 F (36.6 C)   Weight: 228 lb 11.2 oz (103.7 kg)    Physical Exam  HENT:  Mouth/Throat: Oropharynx is clear and moist. No oropharyngeal exudate.    Mildly tender to palpation over sinuses  Cardiovascular: Normal rate, regular rhythm and normal heart sounds.  Pulmonary/Chest: Effort normal and breath sounds normal. No respiratory distress.  Abdominal: Soft. Bowel sounds are normal.     Assessment & Plan:   See Encounters Tab for problem based charting.  Patient seen with Dr. Evette Doffing

## 2017-10-16 NOTE — Assessment & Plan Note (Signed)
History obtained with the assistance of patients mother who was present. Patient reports nasal congestion, runny nose, and cough since October. So far they have tried mucinex and aleve cold and sinus with some relief, but symptoms have persisted especially the patient's cough which is worse at night when the patient lays down. Patient is afebrile and sinuses are only minimally tender. Patient denies fever or chills for the past several weeks with some subjective fevers prior to that. - Flonase, one spray in each nostril, daily - Nasal rinse, each night before bed - Continue supportive care

## 2017-10-19 NOTE — Progress Notes (Signed)
Internal Medicine Clinic Attending  I saw and evaluated the patient.  I personally confirmed the key portions of the history and exam documented by Dr. Melvin and I reviewed pertinent patient test results.  The assessment, diagnosis, and plan were formulated together and I agree with the documentation in the resident's note.  

## 2017-11-14 DIAGNOSIS — Z125 Encounter for screening for malignant neoplasm of prostate: Secondary | ICD-10-CM | POA: Diagnosis not present

## 2017-11-14 DIAGNOSIS — E232 Diabetes insipidus: Secondary | ICD-10-CM | POA: Diagnosis not present

## 2017-11-14 DIAGNOSIS — Z5181 Encounter for therapeutic drug level monitoring: Secondary | ICD-10-CM | POA: Diagnosis not present

## 2017-11-14 DIAGNOSIS — Z79899 Other long term (current) drug therapy: Secondary | ICD-10-CM | POA: Diagnosis not present

## 2017-11-14 DIAGNOSIS — E230 Hypopituitarism: Secondary | ICD-10-CM | POA: Diagnosis not present

## 2017-11-16 DIAGNOSIS — Z125 Encounter for screening for malignant neoplasm of prostate: Secondary | ICD-10-CM | POA: Diagnosis not present

## 2017-11-16 DIAGNOSIS — E232 Diabetes insipidus: Secondary | ICD-10-CM | POA: Diagnosis not present

## 2017-11-16 DIAGNOSIS — Z5181 Encounter for therapeutic drug level monitoring: Secondary | ICD-10-CM | POA: Diagnosis not present

## 2017-11-16 DIAGNOSIS — E230 Hypopituitarism: Secondary | ICD-10-CM | POA: Diagnosis not present

## 2017-12-13 ENCOUNTER — Other Ambulatory Visit: Payer: Self-pay | Admitting: Internal Medicine

## 2017-12-13 DIAGNOSIS — J3089 Other allergic rhinitis: Secondary | ICD-10-CM

## 2017-12-25 ENCOUNTER — Other Ambulatory Visit: Payer: Self-pay | Admitting: Internal Medicine

## 2017-12-25 DIAGNOSIS — R252 Cramp and spasm: Secondary | ICD-10-CM

## 2018-01-17 ENCOUNTER — Telehealth: Payer: Self-pay | Admitting: Internal Medicine

## 2018-01-17 ENCOUNTER — Encounter (INDEPENDENT_AMBULATORY_CARE_PROVIDER_SITE_OTHER): Payer: Self-pay

## 2018-01-17 ENCOUNTER — Ambulatory Visit (INDEPENDENT_AMBULATORY_CARE_PROVIDER_SITE_OTHER): Payer: Medicare Other | Admitting: Internal Medicine

## 2018-01-17 VITALS — BP 137/86 | HR 87 | Temp 98.0°F | Wt 226.8 lb

## 2018-01-17 DIAGNOSIS — R05 Cough: Secondary | ICD-10-CM

## 2018-01-17 DIAGNOSIS — G40909 Epilepsy, unspecified, not intractable, without status epilepticus: Secondary | ICD-10-CM

## 2018-01-17 DIAGNOSIS — Z79899 Other long term (current) drug therapy: Secondary | ICD-10-CM | POA: Diagnosis not present

## 2018-01-17 DIAGNOSIS — R252 Cramp and spasm: Secondary | ICD-10-CM

## 2018-01-17 DIAGNOSIS — E230 Hypopituitarism: Secondary | ICD-10-CM

## 2018-01-17 DIAGNOSIS — J3089 Other allergic rhinitis: Secondary | ICD-10-CM

## 2018-01-17 DIAGNOSIS — R059 Cough, unspecified: Secondary | ICD-10-CM

## 2018-01-17 DIAGNOSIS — E785 Hyperlipidemia, unspecified: Secondary | ICD-10-CM

## 2018-01-17 MED ORDER — GUAIFENESIN-CODEINE 200-10 MG/5ML PO LIQD: 5.0000 mL | Freq: Every evening | ORAL | 1 refills | Status: DC | PRN

## 2018-01-17 NOTE — Telephone Encounter (Signed)
Imari from [employer];  Want to verify the amount given

## 2018-01-17 NOTE — Patient Instructions (Addendum)
It was great to see you again.  You are doing well.  1) I started guaifenesin-codeine 5 ml at night as needed for cough.  2) Keep taking your other medications as you are.  I will see you in 1 year, sooner if necessary.

## 2018-01-17 NOTE — Telephone Encounter (Signed)
Wanted to confirm that dr Eppie Gibson wanted 473 ml dispensed, agreed that it was to be 441ml, and that according to hx that the same pharm rec'd and dispensed a script for same in 2017

## 2018-01-21 ENCOUNTER — Encounter: Payer: Self-pay | Admitting: Internal Medicine

## 2018-01-21 NOTE — Assessment & Plan Note (Deleted)
Assessment  He states he has developed a cough over the last several weeks. This seems to be a persistent problem although his father, who accompanies him during this visit, states this is not perennial. It seems to occur mainly at this time of the year. In the past responded well to guaifenesin-codeine syrup. The patient states he denies a sensation of postnasal drip or rhinorrhea, acid reflux or heartburn, or any wheezing. He also denies any recent infectious symptoms or sick contacts.  Plan  I have prescribed with guaifenesin-codeine syrup 5 mL's at bedtime as needed for cough. He is to continue with his Flonase as well.  We will reassess the efficacy of this therapy at the follow-up visit.

## 2018-01-21 NOTE — Assessment & Plan Note (Signed)
Assessment  He states he has developed a cough over the last several weeks. This seems to be a persistent problem although his father, who accompanies him during this visit, states this is not perennial. It seems to occur mainly at this time of the year. In the past responded well to guaifenesin-codeine syrup. The patient states he denies a sensation of postnasal drip or rhinorrhea, acid reflux or heartburn, or any wheezing. He also denies any recent infectious symptoms or sick contacts.  Plan  I have prescribed with guaifenesin-codeine syrup 5 mL's at bedtime as needed for cough. He is to continue with his Flonase as well.  We will reassess the efficacy of this therapy at the follow-up visit.

## 2018-01-21 NOTE — Assessment & Plan Note (Signed)
He is up-to-date on his preventative health care.

## 2018-01-21 NOTE — Assessment & Plan Note (Signed)
Assessment  He states he has been compliant with his Flonase and that he has no postnasal drip sensation or rhinorrhea on this regimen.  Plan  We will continue the Flonase and reassess for symptomatology on this regimen at the follow-up visit.

## 2018-01-21 NOTE — Assessment & Plan Note (Signed)
Assessment  Since increasing the Flexeril to 20 mg at night his cramping has stabilized.  Plan  We will therefore continue the Flexeril at 20 mg by mouth at night as needed for muscle cramps and reassess at follow-up visit.

## 2018-01-21 NOTE — Progress Notes (Signed)
   Subjective:    Patient ID: Nicholas Holt, male    DOB: 30-Jul-1981, 37 y.o.   MRN: [id number]  HPI  Nicholas Holt is here for follow-up of his panhypopituitarism, seizure disorder, hyperlipidemia, and perennial allergic rhinitis. Please see the A&P for the status of the pt's chronic medical problems.  Review of Systems  Constitutional: Negative for activity change, appetite change and unexpected weight change.  HENT: Negative for congestion, postnasal drip, rhinorrhea, sinus pressure and sinus pain.   Respiratory: Positive for cough. Negative for chest tightness, shortness of breath and wheezing.   Cardiovascular: Negative for chest pain and leg swelling.  Gastrointestinal: Negative for abdominal distention, abdominal pain, constipation, diarrhea, nausea and vomiting.  Musculoskeletal: Negative for arthralgias, gait problem, joint swelling and myalgias.  Skin: Negative for rash and wound.      Objective:   Physical Exam  Constitutional: He is oriented to person, place, and time. He appears well-developed and well-nourished. No distress.  HENT:  Head: Normocephalic and atraumatic.  Eyes: Conjunctivae are normal. Right eye exhibits no discharge. Left eye exhibits no discharge. No scleral icterus.  Cardiovascular: Normal rate, regular rhythm and normal heart sounds. Exam reveals no gallop and no friction rub.  No murmur heard. Pulmonary/Chest: Effort normal and breath sounds normal. No respiratory distress. He has no wheezes. He has no rales.  Musculoskeletal: Normal range of motion. He exhibits no edema, tenderness or deformity.  Neurological: He is alert and oriented to person, place, and time. He exhibits normal muscle tone.  Skin: Skin is warm and dry. No rash noted. He is not diaphoretic. No erythema.  Psychiatric: He has a normal mood and affect. His behavior is normal. Judgment and thought content normal.  Nursing note and vitals reviewed.     Assessment & Plan:    Please see problem oriented charting.

## 2018-01-21 NOTE — Assessment & Plan Note (Signed)
Assessment  He is had no seizures since the last visit 1 year ago while on the lorazepam 6 mg at night.  Plan  We will continue the lorazepam at 6 mg by mouth at night and reassess for seizure activity on this regimen at the follow-up visit.

## 2018-01-21 NOTE — Assessment & Plan Note (Signed)
Assessment  He continues to follow closely with Dr. Buddy Duty of endocrinology. He states he's been compliant with his DDAVP, hydrocortisone 15 mg by mouth daily, and AndroGel. He is without specific complaints related to his panhypopituitarism.  Plan  We will continue the DDAVP 0.01% solution 10 g (1 spray) 4 times daily, hydrocortisone 15 mg by mouth daily, and AndroGel 3 pumps to the right shoulder/back in 3 pumps to the left shoulder/back daily. He will continue to follow closely with Dr. Buddy Duty. We will reassess for symptoms of panhypopituitarism at the follow-up visit.

## 2018-01-30 ENCOUNTER — Other Ambulatory Visit: Payer: Self-pay | Admitting: Internal Medicine

## 2018-01-30 DIAGNOSIS — F4322 Adjustment disorder with anxiety: Secondary | ICD-10-CM

## 2018-01-30 DIAGNOSIS — G40909 Epilepsy, unspecified, not intractable, without status epilepticus: Secondary | ICD-10-CM

## 2018-02-04 ENCOUNTER — Telehealth: Payer: Self-pay | Admitting: Internal Medicine

## 2018-02-04 NOTE — Telephone Encounter (Signed)
Patient mom is been running a fever and the cough medicine isn't working, patient mother is requesting a antibotic. This issue is going on since Dec

## 2018-02-04 NOTE — Telephone Encounter (Signed)
Mom notified. Sched appt in [facility] for tomorrow at [place]. Hubbard Hartshorn, RN, BSN

## 2018-02-04 NOTE — Telephone Encounter (Signed)
Mom called back. States cough with fevers has been going on since last Oct. Has gone through 3 bottles of mucinex without relief. States patient has "rattle" in chest that he is not able to cough up 2/2 decreased lung function following problem with port-a-cath. States when father brought pt to see Dr. Eppie Gibson on 01/17/2018 he forgot to mention daily fevers especially at night. Mom does not have working thermometer but knows when her son is "burning up." Does not want appt as she has called and been seen multiple times for same problem. Requesting antibiotic called in. Will route to Attending pool as PCP is unavailable at this time. Hubbard Hartshorn, RN, BSN

## 2018-02-04 NOTE — Telephone Encounter (Signed)
I can not call in antibiotics unless I have a source of infection and he will need to be seen in [facility] to be assessed

## 2018-02-04 NOTE — Telephone Encounter (Signed)
Thank you :)

## 2018-02-05 ENCOUNTER — Ambulatory Visit: Payer: Self-pay

## 2018-02-05 ENCOUNTER — Telehealth: Payer: Self-pay | Admitting: *Deleted

## 2018-02-05 NOTE — Telephone Encounter (Signed)
Reviewed chart and agree with appt

## 2018-02-05 NOTE — Telephone Encounter (Signed)
This call is r/t to call 3/25, pt's mother calls today and we re-discuss situation. appt encouraged for tomorrow at [facility] Mother feels a chest xray is warranted She states if pt has fevers between now and tomorrow's appt she will take him at that time to urg care It is explained in detail that it is not advisable to order abx w/o md examining pt and determining need She states dr Eppie Gibson understands what is going on w/ pt's lung condition and that father forgot to speak w/ him about fevers

## 2018-02-06 ENCOUNTER — Other Ambulatory Visit: Payer: Self-pay

## 2018-02-06 ENCOUNTER — Ambulatory Visit (INDEPENDENT_AMBULATORY_CARE_PROVIDER_SITE_OTHER): Payer: Medicare Other | Admitting: Internal Medicine

## 2018-02-06 ENCOUNTER — Ambulatory Visit (HOSPITAL_COMMUNITY)
Admission: RE | Admit: 2018-02-06 | Discharge: 2018-02-06 | Disposition: A | Discharge status: Home / Self Care | Payer: Medicare Other | Source: Ambulatory Visit | Attending: Oncology | Admitting: Oncology

## 2018-02-06 ENCOUNTER — Encounter: Payer: Self-pay | Admitting: Internal Medicine

## 2018-02-06 VITALS — BP 138/80 | HR 92 | Temp 97.4°F | Ht 68.0 in | Wt 228.3 lb

## 2018-02-06 DIAGNOSIS — R05 Cough: Secondary | ICD-10-CM

## 2018-02-06 DIAGNOSIS — R053 Chronic cough: Secondary | ICD-10-CM

## 2018-02-06 DIAGNOSIS — R059 Cough, unspecified: Secondary | ICD-10-CM

## 2018-02-06 DIAGNOSIS — R61 Generalized hyperhidrosis: Secondary | ICD-10-CM | POA: Diagnosis not present

## 2018-02-06 HISTORY — DX: Generalized hyperhidrosis: R61

## 2018-02-06 MED ORDER — FEXOFENADINE HCL 180 MG PO TABS: 180.0000 mg | ORAL_TABLET | Freq: Every day | ORAL | 2 refills | Status: DC

## 2018-02-06 MED ORDER — GUAIFENESIN-CODEINE 200-10 MG/5ML PO LIQD: 5.0000 mL | Freq: Every evening | ORAL | 1 refills | Status: DC | PRN

## 2018-02-06 NOTE — Patient Instructions (Addendum)
Thank you for visiting clinic today. We are doing some lab work and will do a chest x-ray-we will call you with any abnormal results. I am giving you another prescription of cough medicine, please take it as directed. Please follow-up in 1 week.

## 2018-02-06 NOTE — Progress Notes (Signed)
CC: Cough and night sweats since October 2018.  HPI:  [name] is a 37 y.o. with past medical history as listed below came to the clinic with his mother with a complaint of dry cough since October 2018 and subjective fevers along with night sweats.  According to his mother it appears that he is having chest congestion but unable to bring any phlegm up, cough is worse at night, do get relieved with guanfacine-codeine cough syrup but he ran out of his prescription as he was taking more than 5 mL at bedtime and unable to get his refill before February 17, 2018.  Patient denies any bad taste in his mouth, epigastric or retrosternal burning sensations or any other GERD symptoms.  He denies any dyspepsia.  His mother was also complaining of intermittent clear rhinorrhea and watery eyes.  Denies any postnasal drip. According to mother he is experiencing daily night sweats and he felt warm by her, never checked her temperature. He denies any chest pain, exertional dyspnea, orthopnea or PND. He denies any recent change in his bowel habits or appetite.  No history of any unintentional weight loss. He denies any urinary symptoms.   Past Medical History:  Diagnosis Date  . Anxiety   . Attention deficit disorder 08/08/2006  . Bilateral leg cramps 11/25/2006  . Blood transfusion without reported diagnosis 1997   Chemotherapy associated  . 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   . 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  Complicated by left sided motor tic disorder and panhypopituitarism.   Marland Kitchen Hyperlipidemia 08/08/2006  . Obesity (BMI 30.0-34.9) 07/09/2015  . 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  Includes  central diabetes insipidus, secondary hypothyroidism, secondary adrenal insufficiency, and secondary hypogonadism.   Marland Kitchen Perennial allergic rhinitis 07/09/2015  . Seizure disorder ([place]) 08/08/2006   Predated brain tumor.  Generalized seizure ocurred after running out of lorazepam post-tumor.   . 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 Treated with alendronate from 2009-2016. DEXA (03/16/2008): L Spine T -2.0. L fem neck T -0.9, R fem neck T -1.9.  DEXA (10/15/2014): L Spine T -1.3, L fem neck T -0.6, R fem neck T -1.8.  Bisphosphonate holiday 06/2015.  Repeat DEXA scan in 10/2016 to reassess.    Review of Systems: Negative except mentioned in HPI.  Physical Exam:  Vitals:   02/06/18 1509  BP: 138/80  Pulse: 92  Temp: (!) 97.4 F (36.3 C)  TempSrc: Oral  SpO2: 100%  Weight: 228 lb 4.8 oz (103.6 kg)  Height: 5\' 8"  (1.727 m)    General: Vital signs reviewed.  Patient is well-developed and well-nourished, alert and oriented with slow cognition, in no acute distress and cooperative with exam.  Head: Normocephalic and atraumatic. Eyes: EOMI, conjunctivae normal, no scleral icterus.  Cardiovascular: RRR, S1 normal, S2 normal, no murmurs, gallops, or rubs. Pulmonary/Chest: Decreased breath sounds at left base otherwise clear to auscultation bilaterally, no wheezes, rales, or rhonchi. Abdominal: Soft, non-tender, non-distended, BS +, no masses, organomegaly, or guarding present.  Extremities: No lower extremity edema bilaterally,  pulses symmetric and intact bilaterally. No cyanosis or clubbing. Skin: Warm, dry and intact. No rashes or erythema. Psychiatric: Slow cognition, does not speak much.  Assessment & Plan:   See Encounters Tab for problem  based charting.  Patient discussed with Dr. Evette Doffing.

## 2018-02-06 NOTE — Assessment & Plan Note (Signed)
According to mother he is having daily night sweats and subjective fevers at night.  -We will check CBC, CMP, ESR and CRP to rule out any inflammatory cause. -Chest x-ray was done today which was without any acute abnormality.

## 2018-02-06 NOTE — Assessment & Plan Note (Signed)
Patient has a long-standing history of cough and elevated left hemidiaphragm. Mother was very concerned about getting an pneumonia. No acute sign of pneumonia but do get daily night sweats, differential can be infectious, malignancy or autoimmune.  He was given a new prescription for guaifenesin-codeine cough syrup as it helped with his cough and he was able to sleep well with that.  According to mother she was not given any measuring cup with that syrup and was giving him more than 5 mL and ran out of his prescription in 2 weeks instead of 1 month. -He was provided with a new prescription of guaifenesin--codeine. -Repeat chest x-ray-shows no acute abnormality, chronically elevated left high hemidiaphragm.

## 2018-02-07 LAB — CBC WITH DIFFERENTIAL/PLATELET
BASOS ABS: 0 10*3/uL (ref 0.0–0.2)
Basos: 0 %
EOS (ABSOLUTE): 0.1 10*3/uL (ref 0.0–0.4)
Eos: 1 %
Hematocrit: 42.6 % (ref 37.5–51.0)
Hemoglobin: 14.1 g/dL (ref 13.0–17.7)
IMMATURE GRANULOCYTES: 0 %
Immature Grans (Abs): 0 10*3/uL (ref 0.0–0.1)
LYMPHS: 35 %
Lymphocytes Absolute: 2.1 10*3/uL (ref 0.7–3.1)
MCH: 29.4 pg (ref 26.6–33.0)
MCHC: 33.1 g/dL (ref 31.5–35.7)
MCV: 89 fL (ref 79–97)
Monocytes Absolute: 0.4 10*3/uL (ref 0.1–0.9)
Monocytes: 6 %
NEUTROS ABS: 3.5 10*3/uL (ref 1.4–7.0)
NEUTROS PCT: 58 %
PLATELETS: 212 10*3/uL (ref 150–379)
RBC: 4.79 x10E6/uL (ref 4.14–5.80)
RDW: 14.2 % (ref 12.3–15.4)
WBC: 6.1 10*3/uL (ref 3.4–10.8)

## 2018-02-07 LAB — CMP14 + ANION GAP
ALK PHOS: 68 IU/L (ref 39–117)
ALT: 25 IU/L (ref 0–44)
AST: 21 IU/L (ref 0–40)
Albumin/Globulin Ratio: 1.5 (ref 1.2–2.2)
Albumin: 4.4 g/dL (ref 3.5–5.5)
Anion Gap: 15 mmol/L (ref 10.0–18.0)
BUN/Creatinine Ratio: 11 (ref 9–20)
BUN: 11 mg/dL (ref 6–20)
CHLORIDE: 103 mmol/L (ref 96–106)
CO2: 22 mmol/L (ref 20–29)
Calcium: 10 mg/dL (ref 8.7–10.2)
Creatinine, Ser: 1.04 mg/dL (ref 0.76–1.27)
GFR calc Af Amer: 106 mL/min/{1.73_m2} (ref >59)
GFR calc non Af Amer: 91 mL/min/{1.73_m2} (ref >59)
GLUCOSE: 89 mg/dL (ref 65–99)
Globulin, Total: 3 g/dL (ref 1.5–4.5)
Potassium: 4.4 mmol/L (ref 3.5–5.2)
Sodium: 140 mmol/L (ref 134–144)
Total Protein: 7.4 g/dL (ref 6.0–8.5)

## 2018-02-07 LAB — URINALYSIS, ROUTINE W REFLEX MICROSCOPIC
BILIRUBIN UA: NEGATIVE
GLUCOSE, UA: NEGATIVE
Ketones, UA: NEGATIVE
LEUKOCYTES UA: NEGATIVE
Nitrite, UA: NEGATIVE
PH UA: 5.5 (ref 5.0–7.5)
PROTEIN UA: NEGATIVE
RBC UA: NEGATIVE
Specific Gravity, UA: 1.006 (ref 1.005–1.030)
Urobilinogen, Ur: 0.2 mg/dL (ref 0.2–1.0)

## 2018-02-07 LAB — SEDIMENTATION RATE: SED RATE: 15 mm/h (ref 0–15)

## 2018-02-07 LAB — C-REACTIVE PROTEIN: CRP: 1.5 mg/L (ref 0.0–4.9)

## 2018-02-07 NOTE — Progress Notes (Signed)
Internal Medicine Clinic Attending  Case discussed with Dr. Amin at the time of the visit.  We reviewed the resident's history and exam and pertinent patient test results.  I agree with the assessment, diagnosis, and plan of care documented in the resident's note.    

## 2018-04-14 ENCOUNTER — Other Ambulatory Visit: Payer: Self-pay | Admitting: Internal Medicine

## 2018-04-14 DIAGNOSIS — J3089 Other allergic rhinitis: Secondary | ICD-10-CM

## 2018-05-22 DIAGNOSIS — Z79899 Other long term (current) drug therapy: Secondary | ICD-10-CM | POA: Diagnosis not present

## 2018-05-22 DIAGNOSIS — E230 Hypopituitarism: Secondary | ICD-10-CM | POA: Diagnosis not present

## 2018-05-22 DIAGNOSIS — E232 Diabetes insipidus: Secondary | ICD-10-CM | POA: Diagnosis not present

## 2018-05-27 DIAGNOSIS — Z125 Encounter for screening for malignant neoplasm of prostate: Secondary | ICD-10-CM | POA: Diagnosis not present

## 2018-05-27 DIAGNOSIS — Z5181 Encounter for therapeutic drug level monitoring: Secondary | ICD-10-CM | POA: Diagnosis not present

## 2018-05-27 DIAGNOSIS — E230 Hypopituitarism: Secondary | ICD-10-CM | POA: Diagnosis not present

## 2018-05-27 DIAGNOSIS — E232 Diabetes insipidus: Secondary | ICD-10-CM | POA: Diagnosis not present

## 2018-06-07 ENCOUNTER — Other Ambulatory Visit: Payer: Self-pay | Admitting: Internal Medicine

## 2018-06-07 DIAGNOSIS — R252 Cramp and spasm: Secondary | ICD-10-CM

## 2018-06-11 ENCOUNTER — Other Ambulatory Visit: Payer: Self-pay | Admitting: Internal Medicine

## 2018-06-11 DIAGNOSIS — J3089 Other allergic rhinitis: Secondary | ICD-10-CM

## 2018-07-05 ENCOUNTER — Other Ambulatory Visit: Payer: Self-pay | Admitting: Internal Medicine

## 2018-07-05 DIAGNOSIS — E230 Hypopituitarism: Secondary | ICD-10-CM

## 2018-07-28 ENCOUNTER — Other Ambulatory Visit: Payer: Self-pay | Admitting: Internal Medicine

## 2018-07-28 DIAGNOSIS — F4322 Adjustment disorder with anxiety: Secondary | ICD-10-CM

## 2018-07-28 DIAGNOSIS — G40909 Epilepsy, unspecified, not intractable, without status epilepticus: Secondary | ICD-10-CM

## 2018-07-31 DIAGNOSIS — Z23 Encounter for immunization: Secondary | ICD-10-CM | POA: Diagnosis not present

## 2018-08-05 ENCOUNTER — Other Ambulatory Visit: Payer: Self-pay | Admitting: Internal Medicine

## 2018-08-05 DIAGNOSIS — R252 Cramp and spasm: Secondary | ICD-10-CM

## 2018-08-27 ENCOUNTER — Other Ambulatory Visit: Payer: Self-pay | Admitting: Internal Medicine

## 2018-08-27 DIAGNOSIS — J3089 Other allergic rhinitis: Secondary | ICD-10-CM

## 2018-08-28 ENCOUNTER — Other Ambulatory Visit: Payer: Self-pay | Admitting: Internal Medicine

## 2018-08-28 DIAGNOSIS — J3089 Other allergic rhinitis: Secondary | ICD-10-CM

## 2018-09-05 ENCOUNTER — Other Ambulatory Visit: Payer: Self-pay | Admitting: Internal Medicine

## 2018-09-05 DIAGNOSIS — R252 Cramp and spasm: Secondary | ICD-10-CM

## 2018-10-09 ENCOUNTER — Other Ambulatory Visit: Payer: Self-pay | Admitting: Internal Medicine

## 2018-10-09 DIAGNOSIS — R252 Cramp and spasm: Secondary | ICD-10-CM

## 2018-12-02 DIAGNOSIS — Z125 Encounter for screening for malignant neoplasm of prostate: Secondary | ICD-10-CM | POA: Diagnosis not present

## 2018-12-02 DIAGNOSIS — E230 Hypopituitarism: Secondary | ICD-10-CM | POA: Diagnosis not present

## 2018-12-02 DIAGNOSIS — Z79899 Other long term (current) drug therapy: Secondary | ICD-10-CM | POA: Diagnosis not present

## 2018-12-02 DIAGNOSIS — Z131 Encounter for screening for diabetes mellitus: Secondary | ICD-10-CM | POA: Diagnosis not present

## 2018-12-05 DIAGNOSIS — R7303 Prediabetes: Secondary | ICD-10-CM | POA: Diagnosis not present

## 2018-12-05 DIAGNOSIS — Z125 Encounter for screening for malignant neoplasm of prostate: Secondary | ICD-10-CM | POA: Diagnosis not present

## 2018-12-05 DIAGNOSIS — E230 Hypopituitarism: Secondary | ICD-10-CM | POA: Diagnosis not present

## 2018-12-05 DIAGNOSIS — E232 Diabetes insipidus: Secondary | ICD-10-CM | POA: Diagnosis not present

## 2018-12-05 DIAGNOSIS — Z5181 Encounter for therapeutic drug level monitoring: Secondary | ICD-10-CM | POA: Diagnosis not present

## 2019-01-24 ENCOUNTER — Other Ambulatory Visit: Payer: Self-pay | Admitting: Internal Medicine

## 2019-01-24 DIAGNOSIS — G40909 Epilepsy, unspecified, not intractable, without status epilepticus: Secondary | ICD-10-CM

## 2019-01-24 DIAGNOSIS — F4322 Adjustment disorder with anxiety: Secondary | ICD-10-CM

## 2019-01-24 NOTE — Telephone Encounter (Signed)
Informed pt needs prior to weekend. Reviewed most recent note.

## 2019-02-10 ENCOUNTER — Encounter: Payer: Self-pay | Admitting: *Deleted

## 2019-02-11 ENCOUNTER — Telehealth: Payer: Self-pay | Admitting: *Deleted

## 2019-02-11 NOTE — Telephone Encounter (Signed)
Call from pt's mother-requesting an antibiotic for pt.  Pt has dry cough and cant cough up anything-state previous pcp Eppie Gibson) had stated  that he has "lung damage" from a porta cath and he would be unable to cough anything up.  States pt feels a little warm-cough worse at night. Currently taking guaifenesin-codeine with no relief.  Worried about pt getting "pneumonia" . Will forward to new pcp please advise.[name]3/31/20204:46 PM

## 2019-02-11 NOTE — Telephone Encounter (Signed)
I think that he needs to be evaluated further prior to receiving an antibiotic. Please have him set up a tele visit with [facility].

## 2019-02-12 ENCOUNTER — Other Ambulatory Visit: Payer: Self-pay

## 2019-02-12 ENCOUNTER — Ambulatory Visit (INDEPENDENT_AMBULATORY_CARE_PROVIDER_SITE_OTHER): Payer: Medicare Other | Admitting: Internal Medicine

## 2019-02-12 ENCOUNTER — Telehealth: Payer: Self-pay | Admitting: Internal Medicine

## 2019-02-12 DIAGNOSIS — R059 Cough, unspecified: Secondary | ICD-10-CM

## 2019-02-12 DIAGNOSIS — R05 Cough: Secondary | ICD-10-CM

## 2019-02-12 NOTE — Telephone Encounter (Signed)
Pt missed the call from telehealth, pls callback [phone]

## 2019-02-12 NOTE — Telephone Encounter (Signed)
Patient on today's schedule for telehealth visit. Hubbard Hartshorn, RN, BSN

## 2019-02-12 NOTE — Assessment & Plan Note (Signed)
   Persistent cough and fevers- spoke with the patient's mother who states that patient has been having worsening of his dry cough and fevers for the past few days.  She was concerned that he may have pneumonia and wanted an antibiotic prescription.  She states that his cough is mostly worse at night.  Denies any mucus or phlegm production.  However she states that he had a Port-A-Cath placed for chemotherapy in the past and since then his "lungs have been damaged" and he is unable to cough up anything.  Per reviewing his most recent chest x-ray seems that his left hemidiaphragm is chronically elevated.  She denies any congestion, sore throat, runny nose watery eyes, chest pain, shortness of breath or nausea.  States that he has chronic night sweats since 2000 because you started on testosterone.  In the past she has been advised to double his cortisone dose however she has tried that has not helped the patient symptoms.  She reports subjective fever of 101.3.  She feels her thermometer is now broken and is going to obtain a new one today.  He states in the past cough syrup has helped however is not helping this time around.  She just started him on Mucinex and Zyrtec yesterday and has not noticed any improvement.  He was discussed that this sounds more viral than bacterial in nature however we would want to see him in the clinic to obtain a chest x-ray and a respiratory viral panel.  Patient's mother was hesitant to bring the patient in due to the [place] pandemic.  Decided to just keep doing supportive measures and monitor.   Plan- patient was advised to come to the clinic however family is hesitant to bring him into the clinic at this time.  We will check back in in 1 to 2 days to see if patient's symptoms have improved.  If he continues to spike fevers we will prescribe a short-term antibiotic course and see how he does.

## 2019-02-12 NOTE — Progress Notes (Signed)
[facility] Telephone Encounter  Reason for call:   This telephone encounter was created for [name] on 02/12/2019 for the following purpose/cc persistent cough and fever.   Pertinent Data:   Chest xray from 01/2018 showed chronic elevation of left diaphragm. 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   Review of Systems  Constitutional: Positive for diaphoresis and fever. Negative for chills.  HENT: Negative for congestion, ear pain, sinus pain and sore throat.   Eyes: Negative for discharge.  Respiratory: Positive for cough. Negative for sputum production and shortness of breath.   Cardiovascular: Negative for chest pain.  Gastrointestinal: Negative for nausea and vomiting.   Assessment / Plan / Recommendations:   Persistent cough and fevers- spoke with the patient's mother who states that patient has been having worsening of his dry cough and fevers for the past few days.  She was concerned that he may have pneumonia and wanted an antibiotic prescription.  She states that his cough is mostly worse at night.  Denies any mucus or phlegm production.  However she states that he had a Port-A-Cath placed for chemotherapy in the past and since then his "lungs have been damaged" and he is unable to cough up anything.  Per reviewing his most recent chest x-ray seems that his left hemidiaphragm is chronically elevated.  She denies any congestion, sore throat, runny nose watery eyes, chest pain, shortness of breath or nausea.  States that he has chronic night sweats since 2000 because you started on testosterone.  In the past she has been advised to double his cortisone dose however she has tried that has not helped the patient symptoms.  She reports subjective fever of 101.3.  She feels her thermometer is now broken and is going to obtain a new one today.  He states in the past cough syrup has helped however is not helping this time around.  She  just started him on Mucinex and Zyrtec yesterday and has not noticed any improvement.  He was discussed that this sounds more viral than bacterial in nature however we would want to see him in the clinic to obtain a chest x-ray and a respiratory viral panel.  Patient's mother was hesitant to bring the patient in due to the [place] pandemic.  Decided to just keep doing supportive measures and monitor.   Plan- patient was advised to come to the clinic however family is hesitant to bring him into the clinic at this time.  We will check back in in 1 to 2 days to see if patient's symptoms have improved.  If he continues to spike fevers we will prescribe a short-term antibiotic course and see how he does.    As always, pt is advised that if symptoms worsen or new symptoms arise, they should go to an urgent care facility or to to ER for further evaluation.   Consent and Medical Decision Making:   Patient discussed with Dr. Lynnae January  This is a telephone encounter between Nicholas Holt mother and Nicholas Holt on 02/12/2019 for persistent cough and fever. The visit was conducted with the patient located at home and Nicholas Holt at [facility]. The patient's identity was confirmed using their DOB and current address. The his/her legal guardian has consented to being evaluated through a telephone encounter and understands the associated risks (an examination cannot be done and the patient may need to come in for an appointment) / benefits (allows the  patient to remain at home, decreasing exposure to coronavirus). I personally spent 17 minutes on medical discussion.

## 2019-02-13 ENCOUNTER — Ambulatory Visit: Payer: Self-pay

## 2019-02-14 ENCOUNTER — Other Ambulatory Visit: Payer: Self-pay | Admitting: *Deleted

## 2019-02-14 DIAGNOSIS — R252 Cramp and spasm: Secondary | ICD-10-CM

## 2019-02-14 MED ORDER — CYCLOBENZAPRINE HCL 10 MG PO TABS: ORAL_TABLET | ORAL | 1 refills | Status: DC

## 2019-02-14 NOTE — Telephone Encounter (Signed)
This patient had a tele visit on April 1st from Dr. Laural Golden. He should be on the follow up visit schedule on [facility]

## 2019-02-14 NOTE — Telephone Encounter (Signed)
Call from pt's mother - requesting refill on Cyclobenzaprine; stated  [facility] had sent request x 2. Pt is out of med and has very bad cramps.

## 2019-02-14 NOTE — Progress Notes (Signed)
Internal Medicine Clinic Attending  Case discussed with Dr. Rehman at the time of the visit.  We reviewed the resident's history and exam and pertinent patient test results.  I agree with the assessment, diagnosis, and plan of care documented in the resident's note.  

## 2019-02-27 ENCOUNTER — Encounter: Payer: Self-pay | Admitting: Internal Medicine

## 2019-06-09 ENCOUNTER — Other Ambulatory Visit: Payer: Self-pay | Admitting: Internal Medicine

## 2019-06-09 DIAGNOSIS — R252 Cramp and spasm: Secondary | ICD-10-CM

## 2019-07-08 ENCOUNTER — Encounter: Payer: Self-pay | Admitting: Internal Medicine

## 2019-07-08 ENCOUNTER — Encounter: Payer: Medicare Other | Admitting: Internal Medicine

## 2019-07-15 ENCOUNTER — Other Ambulatory Visit: Payer: Self-pay | Admitting: Internal Medicine

## 2019-07-15 DIAGNOSIS — G40909 Epilepsy, unspecified, not intractable, without status epilepticus: Secondary | ICD-10-CM

## 2019-07-15 DIAGNOSIS — F4322 Adjustment disorder with anxiety: Secondary | ICD-10-CM

## 2019-07-15 NOTE — Telephone Encounter (Signed)
Next appt scheduled 10/6 with PCP.

## 2019-07-16 NOTE — Telephone Encounter (Signed)
Call from pt's mother stating refill not at pharmacy.  CMA made call to pharmacy-medication ready for pickup.[name]9/2/202011:55 AM

## 2019-08-19 ENCOUNTER — Encounter: Payer: Self-pay | Admitting: Internal Medicine

## 2019-08-19 ENCOUNTER — Other Ambulatory Visit: Payer: Self-pay | Admitting: *Deleted

## 2019-08-19 ENCOUNTER — Other Ambulatory Visit: Payer: Self-pay

## 2019-08-19 ENCOUNTER — Ambulatory Visit (INDEPENDENT_AMBULATORY_CARE_PROVIDER_SITE_OTHER): Payer: Medicare Other | Admitting: Internal Medicine

## 2019-08-19 VITALS — BP 159/97 | HR 80 | Temp 97.9°F | Wt 228.3 lb

## 2019-08-19 DIAGNOSIS — R252 Cramp and spasm: Secondary | ICD-10-CM | POA: Diagnosis not present

## 2019-08-19 DIAGNOSIS — Z79891 Long term (current) use of opiate analgesic: Secondary | ICD-10-CM

## 2019-08-19 DIAGNOSIS — M8580 Other specified disorders of bone density and structure, unspecified site: Secondary | ICD-10-CM

## 2019-08-19 DIAGNOSIS — G4701 Insomnia due to medical condition: Secondary | ICD-10-CM | POA: Diagnosis not present

## 2019-08-19 DIAGNOSIS — E230 Hypopituitarism: Secondary | ICD-10-CM | POA: Diagnosis not present

## 2019-08-19 DIAGNOSIS — F4322 Adjustment disorder with anxiety: Secondary | ICD-10-CM

## 2019-08-19 DIAGNOSIS — J3089 Other allergic rhinitis: Secondary | ICD-10-CM

## 2019-08-19 DIAGNOSIS — Z79899 Other long term (current) drug therapy: Secondary | ICD-10-CM

## 2019-08-19 DIAGNOSIS — G40909 Epilepsy, unspecified, not intractable, without status epilepticus: Secondary | ICD-10-CM

## 2019-08-19 DIAGNOSIS — Z85841 Personal history of malignant neoplasm of brain: Secondary | ICD-10-CM | POA: Diagnosis not present

## 2019-08-19 DIAGNOSIS — E785 Hyperlipidemia, unspecified: Secondary | ICD-10-CM | POA: Diagnosis not present

## 2019-08-19 DIAGNOSIS — G253 Myoclonus: Secondary | ICD-10-CM | POA: Diagnosis not present

## 2019-08-19 DIAGNOSIS — M8589 Other specified disorders of bone density and structure, multiple sites: Secondary | ICD-10-CM

## 2019-08-19 DIAGNOSIS — Z23 Encounter for immunization: Secondary | ICD-10-CM

## 2019-08-19 DIAGNOSIS — Z0000 Encounter for general adult medical examination without abnormal findings: Secondary | ICD-10-CM

## 2019-08-19 MED ORDER — CYCLOBENZAPRINE HCL 10 MG PO TABS: ORAL_TABLET | ORAL | 1 refills | Status: DC

## 2019-08-19 MED ORDER — LORAZEPAM 2 MG PO TABS: 6.0000 mg | ORAL_TABLET | Freq: Every day | ORAL | 0 refills | Status: DC

## 2019-08-19 NOTE — Assessment & Plan Note (Signed)
-  Patient has not had any recurrent symptoms that would be concerning for recurrent neoplasm -He has no headaches, no nausea or vomiting, no focal neurological deficits -It is been over 20 years since this was diagnosed and he does not need any further imaging at this time -If he does develop symptoms concerning for neoplasm we will consider an MRI -No further work-up at this time

## 2019-08-19 NOTE — Assessment & Plan Note (Signed)
-  This problem is chronic and stable -Patient states that his symptoms are well controlled since increasing his Flexeril to 20 mg at night -No further work-up at this time -We will refill his Flexeril today

## 2019-08-19 NOTE — Progress Notes (Signed)
   Subjective:    Patient ID: Nicholas Holt, male    DOB: May 27, 1981, 38 y.o.   MRN: [id number]  HPI  I have seen and examined this patient.  Patient is here for routine follow-up of his seizure disorder and osteopenia.  Patient states that he feels well except for some intermittent cough which he has had since the spring.  This is improved but he still has some mild lingering symptoms.  He denies any other complaints currently and states that he feels well and is compliant with all his medications.  Review of Systems  Constitutional: Negative.   HENT: Negative.   Respiratory: Positive for cough. Negative for chest tightness, shortness of breath and wheezing.   Cardiovascular: Negative.   Gastrointestinal: Negative.   Musculoskeletal: Negative.   Neurological: Negative.   Psychiatric/Behavioral: Negative.        Objective:   Physical Exam Constitutional:      Appearance: Normal appearance.  HENT:     Head: Normocephalic and atraumatic.     Mouth/Throat:     Mouth: Mucous membranes are moist.     Pharynx: Oropharynx is clear. No oropharyngeal exudate.  Neck:     Musculoskeletal: Neck supple.  Cardiovascular:     Rate and Rhythm: Normal rate and regular rhythm.  Pulmonary:     Effort: Pulmonary effort is normal.     Breath sounds: Normal breath sounds. No wheezing or rales.  Abdominal:     General: Bowel sounds are normal. There is no distension.     Palpations: Abdomen is soft.     Tenderness: There is no abdominal tenderness.  Musculoskeletal:        General: No swelling.  Lymphadenopathy:     Cervical: No cervical adenopathy.  Neurological:     General: No focal deficit present.     Mental Status: He is alert and oriented to person, place, and time.  Psychiatric:        Mood and Affect: Mood normal.        Behavior: Behavior normal.           Assessment & Plan:  Please see problem based charting for assessment and plan:

## 2019-08-19 NOTE — Patient Instructions (Signed)
-  It was a pleasure meeting you today -We will give you a flu shot today -I will put in a referral for the bone scan today -We will check some blood work on you today -Please take Zyrtec if your cough is persistent.  I suspect this is secondary to your allergies -Please follow-up with Dr. Buddy Duty -Please call me with any questions or concerns.

## 2019-08-19 NOTE — Assessment & Plan Note (Signed)
-  This problem is chronic and stable -Patient has been off bisphosphonate since 2017 -He has no symptoms or signs of the bone fragility fracture -His last DEXA scan was ordered in 2018 -Patient's Z score was -2.2 on his last DEXA scan in 2018 which is in the low range for his age -For men age less than 79 Z scores are preferred over T-scores -We will repeat his DEXA scan again this year -No further work-up at this time

## 2019-08-19 NOTE — Assessment & Plan Note (Signed)
-  This problem is chronic and stable -Patient has been on Lorazepam 6 mg at night for a long period of time -No episodes of recurrent seizures in the last year -We will continue with current dose of lorazepam for now -I have refilled this medication for them today -No further work-up at this time

## 2019-08-19 NOTE — Assessment & Plan Note (Signed)
Flu shot given today

## 2019-08-19 NOTE — Assessment & Plan Note (Addendum)
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  It has been over 20 years since this treatment -Patient has no new complaints related to his panhypopituitarism at this time -Patient has not followed up with Dr. Buddy Duty since the beginning of the pandemic -He is compliant with his DDAVP, hydrocortisone, levothyroxine and AndroGel -Encouraged him to follow-up with Dr. Buddy Duty in the next month or 2 -No further work-up at this time.  We will continue with current medication regimen for now.  Addendum: -Patient CBC and BMP were within normal limits.  I discussed these results with the patient's mother.  No further work-up at this time.

## 2019-08-19 NOTE — Assessment & Plan Note (Addendum)
-  This problem is chronic and stable -Patient's last ASCVD risk score in 2016 was less than 4% -We will recheck his lipid panel today and calculate his ASCVD risk score -No further work-up at this time  Addendum: -Based on his lipid panel from yesterday patient's 10-year ASCVD risk is approximately 4%. -He would not qualify for statin initiation at this time -I discussed the results with his mother and encourage lifestyle modification changes including diet and exercise.  His mother expressed understanding and is in agreement with plan.

## 2019-08-19 NOTE — Assessment & Plan Note (Signed)
-  Patient and his mother state that he has a lingering cough since spring but overall this appears to be well controlled -Patient does have occasional episodes of a stuffy or runny nose -I suspect the patient may have a postnasal drip secondary to his allergic rhinitis which is causing his cough -His lungs are clear to auscultation and he has no other signs of an underlying respiratory infection -I encouraged the patient to continue with his Flonase and to take Zyrtec daily to see if this will help with his symptoms -No further work-up at this time

## 2019-08-20 LAB — BMP8+ANION GAP
Anion Gap: 14 mmol/L (ref 10.0–18.0)
BUN/Creatinine Ratio: 10 (ref 9–20)
BUN: 11 mg/dL (ref 6–20)
CO2: 24 mmol/L (ref 20–29)
Calcium: 9.7 mg/dL (ref 8.7–10.2)
Chloride: 98 mmol/L (ref 96–106)
Creatinine, Ser: 1.11 mg/dL (ref 0.76–1.27)
GFR calc Af Amer: 96 mL/min/{1.73_m2} (ref >59)
GFR calc non Af Amer: 83 mL/min/{1.73_m2} (ref >59)
Glucose: 84 mg/dL (ref 65–99)
Potassium: 3.7 mmol/L (ref 3.5–5.2)
Sodium: 136 mmol/L (ref 134–144)

## 2019-08-20 LAB — CBC WITH DIFFERENTIAL/PLATELET
Basophils Absolute: 0 10*3/uL (ref 0.0–0.2)
Basos: 1 %
EOS (ABSOLUTE): 0.1 10*3/uL (ref 0.0–0.4)
Eos: 1 %
Hematocrit: 44.4 % (ref 37.5–51.0)
Hemoglobin: 14.5 g/dL (ref 13.0–17.7)
Immature Grans (Abs): 0 10*3/uL (ref 0.0–0.1)
Immature Granulocytes: 0 %
Lymphocytes Absolute: 3 10*3/uL (ref 0.7–3.1)
Lymphs: 48 %
MCH: 29.5 pg (ref 26.6–33.0)
MCHC: 32.7 g/dL (ref 31.5–35.7)
MCV: 90 fL (ref 79–97)
Monocytes Absolute: 0.4 10*3/uL (ref 0.1–0.9)
Monocytes: 6 %
Neutrophils Absolute: 2.8 10*3/uL (ref 1.4–7.0)
Neutrophils: 44 %
Platelets: 197 10*3/uL (ref 150–450)
RBC: 4.92 x10E6/uL (ref 4.14–5.80)
RDW: 13.6 % (ref 11.6–15.4)
WBC: 6.3 10*3/uL (ref 3.4–10.8)

## 2019-08-20 LAB — LIPID PANEL
Chol/HDL Ratio: 4.9 ratio (ref 0.0–5.0)
Cholesterol, Total: 235 mg/dL — ABNORMAL HIGH (ref 100–199)
HDL: 48 mg/dL (ref >39)
LDL Chol Calc (NIH): 164 mg/dL — ABNORMAL HIGH (ref 0–99)
Triglycerides: 130 mg/dL (ref 0–149)
VLDL Cholesterol Cal: 23 mg/dL (ref 5–40)

## 2019-09-02 ENCOUNTER — Other Ambulatory Visit: Payer: Self-pay | Admitting: *Deleted

## 2019-09-02 DIAGNOSIS — E230 Hypopituitarism: Secondary | ICD-10-CM

## 2019-09-02 DIAGNOSIS — R252 Cramp and spasm: Secondary | ICD-10-CM

## 2019-09-02 MED ORDER — HYDROCORTISONE 10 MG PO TABS: ORAL_TABLET | ORAL | 11 refills | Status: DC

## 2019-09-02 MED ORDER — POTASSIUM CHLORIDE ER 10 MEQ PO TBCR: 20.0000 meq | EXTENDED_RELEASE_TABLET | Freq: Two times a day (BID) | ORAL | 3 refills | Status: DC

## 2019-09-10 ENCOUNTER — Telehealth: Payer: Self-pay | Admitting: *Deleted

## 2019-09-10 DIAGNOSIS — R059 Cough, unspecified: Secondary | ICD-10-CM

## 2019-09-10 DIAGNOSIS — R05 Cough: Secondary | ICD-10-CM

## 2019-09-10 MED ORDER — VIRTUSSIN A/C 100-10 MG/5ML PO SOLN: 5.0000 mL | Freq: Every evening | ORAL | 0 refills | Status: DC | PRN

## 2019-09-10 NOTE — Telephone Encounter (Signed)
Fax from [facility] - requesting refill on Virtussin AC syrup  Take 5 ml every night at bedtime as needed Last refilled 01/31/19 Qty 473. Not on current med list. Thanks

## 2019-09-10 NOTE — Telephone Encounter (Signed)
Filled it.

## 2019-09-17 ENCOUNTER — Other Ambulatory Visit: Payer: Self-pay | Admitting: Internal Medicine

## 2019-09-17 DIAGNOSIS — F4322 Adjustment disorder with anxiety: Secondary | ICD-10-CM

## 2019-09-17 DIAGNOSIS — G40909 Epilepsy, unspecified, not intractable, without status epilepticus: Secondary | ICD-10-CM

## 2019-10-17 ENCOUNTER — Other Ambulatory Visit: Payer: Self-pay | Admitting: Internal Medicine

## 2019-10-17 DIAGNOSIS — F4322 Adjustment disorder with anxiety: Secondary | ICD-10-CM

## 2019-10-17 DIAGNOSIS — G40909 Epilepsy, unspecified, not intractable, without status epilepticus: Secondary | ICD-10-CM

## 2019-11-17 ENCOUNTER — Other Ambulatory Visit: Payer: Self-pay | Admitting: *Deleted

## 2019-11-17 DIAGNOSIS — G40909 Epilepsy, unspecified, not intractable, without status epilepticus: Secondary | ICD-10-CM

## 2019-11-17 DIAGNOSIS — F4322 Adjustment disorder with anxiety: Secondary | ICD-10-CM

## 2019-11-18 MED ORDER — LORAZEPAM 2 MG PO TABS: ORAL_TABLET | ORAL | 0 refills | Status: DC

## 2019-12-09 ENCOUNTER — Encounter: Payer: Self-pay | Admitting: Internal Medicine

## 2019-12-09 ENCOUNTER — Ambulatory Visit (INDEPENDENT_AMBULATORY_CARE_PROVIDER_SITE_OTHER): Payer: Medicare Other | Admitting: Internal Medicine

## 2019-12-09 DIAGNOSIS — M8580 Other specified disorders of bone density and structure, unspecified site: Secondary | ICD-10-CM | POA: Diagnosis not present

## 2019-12-09 DIAGNOSIS — J3089 Other allergic rhinitis: Secondary | ICD-10-CM | POA: Diagnosis not present

## 2019-12-09 DIAGNOSIS — R252 Cramp and spasm: Secondary | ICD-10-CM

## 2019-12-09 DIAGNOSIS — E230 Hypopituitarism: Secondary | ICD-10-CM

## 2019-12-09 DIAGNOSIS — Z79899 Other long term (current) drug therapy: Secondary | ICD-10-CM

## 2019-12-09 DIAGNOSIS — T380X5A Adverse effect of glucocorticoids and synthetic analogues, initial encounter: Secondary | ICD-10-CM

## 2019-12-09 DIAGNOSIS — Z79890 Hormone replacement therapy (postmenopausal): Secondary | ICD-10-CM

## 2019-12-09 DIAGNOSIS — Z0000 Encounter for general adult medical examination without abnormal findings: Secondary | ICD-10-CM

## 2019-12-09 DIAGNOSIS — I10 Essential (primary) hypertension: Secondary | ICD-10-CM | POA: Insufficient documentation

## 2019-12-09 DIAGNOSIS — G40909 Epilepsy, unspecified, not intractable, without status epilepticus: Secondary | ICD-10-CM

## 2019-12-09 DIAGNOSIS — R030 Elevated blood-pressure reading, without diagnosis of hypertension: Secondary | ICD-10-CM | POA: Diagnosis not present

## 2019-12-09 MED ORDER — BLOOD PRESSURE MONITOR KIT: PACK | 0 refills | Status: DC

## 2019-12-09 NOTE — Assessment & Plan Note (Signed)
-  Patient noted to have a mildly elevated initial blood pressure with SBP greater than 140 -Repeat blood pressures done here with SBP's in the 130s -Patient's mother would like patient to monitor blood pressure at home and a prescription was sent to his pharmacy for a blood pressure cuff -Patient to monitor blood pressure at home and follow-up with [place] in 3 months -No further work-up at this time

## 2019-12-09 NOTE — Assessment & Plan Note (Signed)
-  This problem is chronic and stable -Patient has been off his bisphosphonate since 2017 -I have reordered DEXA scan for him and he is due to get this in February -No further work-up at this time

## 2019-12-09 NOTE — Patient Instructions (Signed)
-  It was a pleasure seeing you today -Please follow-up for your bone scan -I have asked Ms. Mamie to give you the information about Covid vaccine.  Please try to get this when it is available to you -I have put in a prescription to the pharmacy for blood pressure monitoring kit.  Please let me know if you have any issues getting this -Please follow-up with me in 3 months

## 2019-12-09 NOTE — Assessment & Plan Note (Signed)
-  This problem is chronic and stable -Patient has been chronically on lorazepam 6 mg at night -We will continue with his current dose of lorazepam for now -No further work-up at this time

## 2019-12-09 NOTE — Assessment & Plan Note (Signed)
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 -Patient states that he followed up with Dr. Buddy Duty in December and had his Synthroid dose decreased -He is compliant with his DDAVP, hydrocortisone, levothyroxine and AndroGel -No further work-up at this time -We will consider following up to repeat CBC and BMP at his follow-up visit

## 2019-12-09 NOTE — Assessment & Plan Note (Signed)
-  This problem is chronic and stable -Patient states that his leg cramps are much improved but he occasionally has some episodes of cramping -We will continue Flexeril as needed -No further work-up at this time

## 2019-12-09 NOTE — Progress Notes (Signed)
   Subjective:    Patient ID: Nicholas Holt, male    DOB: [birth year], 39 y.o.   MRN: [id number]  HPI  I have seen examined the patient.  Patient is here for routine follow-up of his allergic rhinitis as well as his seizure disorder.  Patient denies any complaints today and states that he feels well.  Patient's mother is also present and states that the patient is doing well and that his cough is improved.   Review of Systems  Constitutional: Negative.   HENT: Negative.   Respiratory: Negative.   Cardiovascular: Negative.   Gastrointestinal: Negative.   Musculoskeletal: Negative.   Neurological: Negative.   Psychiatric/Behavioral: Negative.        Objective:   Physical Exam Constitutional:      Appearance: Normal appearance.  HENT:     Head: Normocephalic and atraumatic.  Eyes:     Pupils: Pupils are equal, round, and reactive to light.  Cardiovascular:     Rate and Rhythm: Normal rate and regular rhythm.     Heart sounds: Normal heart sounds.  Pulmonary:     Effort: Pulmonary effort is normal.     Breath sounds: Normal breath sounds. No wheezing.  Abdominal:     General: Bowel sounds are normal. There is no distension.     Palpations: Abdomen is soft.     Tenderness: There is no abdominal tenderness.  Musculoskeletal:        General: No swelling or tenderness.     Cervical back: Neck supple.  Lymphadenopathy:     Cervical: No cervical adenopathy.  Neurological:     General: No focal deficit present.     Mental Status: He is alert and oriented to person, place, and time.  Psychiatric:        Mood and Affect: Mood normal.        Behavior: Behavior normal.           Assessment & Plan:  Please see problem based charting for assessment and plan:

## 2019-12-09 NOTE — Assessment & Plan Note (Signed)
-  Patient's cough is now improved. -He currently feels well and has no complaints.  I suspect the patient may have had a postnasal drip secondary to his allergic rhinitis which is what was causing his cough. -We will continue with Flonase for now. -No further work-up at this time

## 2019-12-09 NOTE — Assessment & Plan Note (Signed)
-  Discussed importance of taking the Covid vaccine with the patient and his mother. -They wanted more information on how to get scheduled for this and have obtained the phone number to call from Ms. Mamie

## 2019-12-14 ENCOUNTER — Other Ambulatory Visit: Payer: Self-pay | Admitting: Internal Medicine

## 2019-12-14 DIAGNOSIS — F4322 Adjustment disorder with anxiety: Secondary | ICD-10-CM

## 2019-12-14 DIAGNOSIS — G40909 Epilepsy, unspecified, not intractable, without status epilepticus: Secondary | ICD-10-CM

## 2019-12-15 ENCOUNTER — Ambulatory Visit
Admission: RE | Admit: 2019-12-15 | Discharge: 2019-12-15 | Disposition: A | Discharge status: Home / Self Care | Payer: Medicare Other | Source: Ambulatory Visit | Attending: Internal Medicine | Admitting: Internal Medicine

## 2019-12-15 ENCOUNTER — Telehealth: Payer: Self-pay | Admitting: Internal Medicine

## 2019-12-15 ENCOUNTER — Other Ambulatory Visit: Payer: Self-pay

## 2019-12-15 DIAGNOSIS — T380X5A Adverse effect of glucocorticoids and synthetic analogues, initial encounter: Secondary | ICD-10-CM

## 2019-12-15 DIAGNOSIS — M8589 Other specified disorders of bone density and structure, multiple sites: Secondary | ICD-10-CM | POA: Diagnosis not present

## 2019-12-15 DIAGNOSIS — M818 Other osteoporosis without current pathological fracture: Secondary | ICD-10-CM

## 2019-12-15 DIAGNOSIS — M8580 Other specified disorders of bone density and structure, unspecified site: Secondary | ICD-10-CM

## 2019-12-15 MED ORDER — ALENDRONATE SODIUM 70 MG PO TABS: 70.0000 mg | ORAL_TABLET | ORAL | 11 refills | Status: DC

## 2019-12-15 NOTE — Telephone Encounter (Signed)
I called the patient to discuss the results of the DEXA scan with them.  Patient's Z score was -2.3 at his right femoral neck which is below the expected range for his age.  We will resume alendronate for him.  I suspect that his osteoporosis is secondary to steroid use secondary to his panhypopituitarism as well as his ongoing hypogonadism.  I discussed the results with the patient's mother and she expresses understanding and is in agreement with plan.  I will call in alendronate for him for once weekly dosing.

## 2020-01-11 ENCOUNTER — Ambulatory Visit: Payer: Medicare Other | Attending: Internal Medicine

## 2020-01-11 DIAGNOSIS — Z23 Encounter for immunization: Secondary | ICD-10-CM

## 2020-01-11 NOTE — Progress Notes (Signed)
   Covid-19 Vaccination Clinic  Name:  Nicholas Holt    MRN: [id number] DOB: December 18, 1980  01/11/2020  Nicholas Holt was observed post Covid-19 immunization for 15 minutes without incidence. He was provided with Vaccine Information Sheet and instruction to access the V-Safe system.   Nicholas Holt was instructed to call 911 with any severe reactions post vaccine: Marland Kitchen Difficulty breathing  . Swelling of your face and throat  . A fast heartbeat  . A bad rash all over your body  . Dizziness and weakness    Immunizations Administered    Name Date Dose VIS Date Route   Pfizer COVID-19 Vaccine 01/11/2020  2:21 PM 0.3 mL 10/24/2019 Intramuscular   Manufacturer: [place]   Lot: [id number]   [place]: [id number]

## 2020-01-13 ENCOUNTER — Other Ambulatory Visit: Payer: Self-pay | Admitting: Internal Medicine

## 2020-01-13 DIAGNOSIS — R252 Cramp and spasm: Secondary | ICD-10-CM

## 2020-01-13 DIAGNOSIS — F4322 Adjustment disorder with anxiety: Secondary | ICD-10-CM

## 2020-01-13 DIAGNOSIS — G40909 Epilepsy, unspecified, not intractable, without status epilepticus: Secondary | ICD-10-CM

## 2020-02-05 DIAGNOSIS — E230 Hypopituitarism: Secondary | ICD-10-CM | POA: Diagnosis not present

## 2020-02-10 ENCOUNTER — Ambulatory Visit: Payer: Medicare Other | Attending: Internal Medicine

## 2020-02-10 ENCOUNTER — Other Ambulatory Visit: Payer: Self-pay | Admitting: Internal Medicine

## 2020-02-10 DIAGNOSIS — F4322 Adjustment disorder with anxiety: Secondary | ICD-10-CM

## 2020-02-10 DIAGNOSIS — Z23 Encounter for immunization: Secondary | ICD-10-CM

## 2020-02-10 DIAGNOSIS — G40909 Epilepsy, unspecified, not intractable, without status epilepticus: Secondary | ICD-10-CM

## 2020-02-10 NOTE — Progress Notes (Signed)
   Covid-19 Vaccination Clinic  Name:  Nicholas Holt    MRN: [id number] DOB: 1981-08-01  02/10/2020  [name] was observed post Covid-19 immunization for 15 minutes without incident. He was provided with Vaccine Information Sheet and instruction to access the V-Safe system.   [name] was instructed to call 911 with any severe reactions post vaccine: Marland Kitchen Difficulty breathing  . Swelling of face and throat  . A fast heartbeat  . A bad rash all over body  . Dizziness and weakness   Immunizations Administered    Name Date Dose VIS Date Route   Pfizer COVID-19 Vaccine 02/10/2020  1:39 PM 0.3 mL 10/24/2019 Intramuscular   Manufacturer: [employer]   Lot: [id number]   [place]: [id number]

## 2020-03-09 ENCOUNTER — Encounter: Payer: Self-pay | Admitting: Internal Medicine

## 2020-03-09 ENCOUNTER — Ambulatory Visit (INDEPENDENT_AMBULATORY_CARE_PROVIDER_SITE_OTHER): Payer: Medicare Other | Admitting: Internal Medicine

## 2020-03-09 ENCOUNTER — Other Ambulatory Visit: Payer: Self-pay

## 2020-03-09 VITALS — BP 145/85 | HR 90 | Temp 97.7°F | Ht 68.0 in | Wt 226.7 lb

## 2020-03-09 DIAGNOSIS — R030 Elevated blood-pressure reading, without diagnosis of hypertension: Secondary | ICD-10-CM

## 2020-03-09 DIAGNOSIS — Z85841 Personal history of malignant neoplasm of brain: Secondary | ICD-10-CM | POA: Diagnosis not present

## 2020-03-09 DIAGNOSIS — E230 Hypopituitarism: Secondary | ICD-10-CM | POA: Diagnosis not present

## 2020-03-09 DIAGNOSIS — Z0000 Encounter for general adult medical examination without abnormal findings: Secondary | ICD-10-CM

## 2020-03-09 DIAGNOSIS — M818 Other osteoporosis without current pathological fracture: Secondary | ICD-10-CM | POA: Diagnosis not present

## 2020-03-09 DIAGNOSIS — T380X5A Adverse effect of glucocorticoids and synthetic analogues, initial encounter: Secondary | ICD-10-CM | POA: Diagnosis not present

## 2020-03-09 DIAGNOSIS — G40909 Epilepsy, unspecified, not intractable, without status epilepticus: Secondary | ICD-10-CM

## 2020-03-09 NOTE — Assessment & Plan Note (Signed)
-  Received both doses of his Covid vaccine

## 2020-03-09 NOTE — Assessment & Plan Note (Addendum)
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 Buddy Duty but has not seen him since December.  At that time patient did have a Synthroid dose decreased -Patient is compliant with DDAVP, hydrocortisone, levothyroxine and AndroGel -We will follow-up repeat TSH, free T4 and free T3 today as well as a CBC and a BMP -No further work-up at this time  Addendum: -Patient CBC and BMP were within normal limits.  Patient also noted to have a low TSH but this is to be expected secondary to his panhypopituitarism.  We will follow-up free T4 and free T3 levels. -Results discussed with patient's mother.  No further work-up at this time.  She expressed understanding and is in agreement with plan.

## 2020-03-09 NOTE — Progress Notes (Signed)
   Subjective:    Patient ID: Nicholas Holt, male    DOB: [birth year], 39 y.o.   MRN: [id number]  HPI  I have seen and examined this patient.  Patient is here for routine follow-up of his seizure disorder and panhypopituitarism.  Patient states that he feels well today and denies any new complaints.  Patient is compliant with all his medications.   Review of Systems  Constitutional: Negative.   HENT: Negative.   Respiratory: Negative.   Cardiovascular: Negative.   Gastrointestinal: Negative.   Musculoskeletal: Negative.   Neurological: Negative.  Negative for seizures.  Psychiatric/Behavioral: Negative.        Objective:   Physical Exam Constitutional:      Appearance: Normal appearance.  HENT:     Head: Normocephalic and atraumatic.  Cardiovascular:     Rate and Rhythm: Normal rate and regular rhythm.     Heart sounds: Normal heart sounds.  Pulmonary:     Effort: Pulmonary effort is normal.     Breath sounds: Normal breath sounds. No wheezing or rales.  Abdominal:     General: Bowel sounds are normal. There is no distension.     Palpations: Abdomen is soft.     Tenderness: There is no abdominal tenderness.  Musculoskeletal:        General: No swelling or tenderness.     Cervical back: Neck supple.  Lymphadenopathy:     Cervical: No cervical adenopathy.  Neurological:     Mental Status: He is alert and oriented to person, place, and time.  Psychiatric:        Mood and Affect: Mood normal.        Behavior: Behavior normal.           Assessment & Plan:  Please see problem based charting for assessment and plan:

## 2020-03-09 NOTE — Assessment & Plan Note (Signed)
-  Patient was diagnosed with a suprasellar germinoma treated at [facility] in 1997 -Patient is not having recurrent symptoms including no new neurological deficits, no headaches, no nausea or vomiting -Patient does not need further imaging at this time as it has been over 20 years since he was diagnosed with this -No further work-up for now

## 2020-03-09 NOTE — Patient Instructions (Signed)
-  It was a pleasure seeing you today -We will check some blood work on you today -Please call me if you need any refills on your medications or if you have any questions or concerns -Please follow-up with me in 6 months -Please bring your home blood pressure recordings at your follow-up visit

## 2020-03-09 NOTE — Assessment & Plan Note (Signed)
-  This problem is chronic and stable -Patient has not had any seizures in a while and has been doing well on this regimen -We will continue with lorazepam 6 mg at night -No further work-up at this time

## 2020-03-09 NOTE — Assessment & Plan Note (Signed)
-  Patient was noted to have a Z score of -2.3 at his last DEXA scan in February -This was worse than his previous Z score of -2.2.  Patient has been on a bisphosphonate holiday since 2017 -Given patient's mildly worsening Z score as well as continued glucocorticoid therapy and increased risk for osteoporosis I have restarted him on alendronate.  -I would consider rechecking a DEXA scan on him in 1 to 2 years and giving him another bisphosphonate holiday if his Z scores remained stable

## 2020-03-09 NOTE — Assessment & Plan Note (Signed)
-  This problem is chronic and stable. -Patient states that he has been having blood pressure monitor at home with the home blood pressure cuff -His blood pressure is mildly elevated today.  We will have patient bring in his home blood pressure readings at his next visit -No further work-up at this time.

## 2020-03-10 LAB — CBC
Hematocrit: 41.4 % (ref 37.5–51.0)
Hemoglobin: 13.7 g/dL (ref 13.0–17.7)
MCH: 29.8 pg (ref 26.6–33.0)
MCHC: 33.1 g/dL (ref 31.5–35.7)
MCV: 90 fL (ref 79–97)
Platelets: 171 10*3/uL (ref 150–450)
RBC: 4.6 x10E6/uL (ref 4.14–5.80)
RDW: 13.5 % (ref 11.6–15.4)
WBC: 5.7 10*3/uL (ref 3.4–10.8)

## 2020-03-10 LAB — BMP8+ANION GAP
Anion Gap: 11 mmol/L (ref 10.0–18.0)
BUN/Creatinine Ratio: 12 (ref 9–20)
BUN: 14 mg/dL (ref 6–20)
CO2: 25 mmol/L (ref 20–29)
Calcium: 9.4 mg/dL (ref 8.7–10.2)
Chloride: 104 mmol/L (ref 96–106)
Creatinine, Ser: 1.15 mg/dL (ref 0.76–1.27)
GFR calc Af Amer: 92 mL/min/{1.73_m2} (ref >59)
GFR calc non Af Amer: 80 mL/min/{1.73_m2} (ref >59)
Glucose: 81 mg/dL (ref 65–99)
Potassium: 4.5 mmol/L (ref 3.5–5.2)
Sodium: 140 mmol/L (ref 134–144)

## 2020-03-10 LAB — TSH: TSH: 0.022 u[IU]/mL — ABNORMAL LOW (ref 0.450–4.500)

## 2020-03-10 NOTE — Addendum Note (Signed)
Addended by: Aldine Contes on: 03/10/2020 02:19 PM   Modules accepted: Orders

## 2020-03-11 LAB — T4, FREE: Free T4: 1.25 ng/dL (ref 0.82–1.77)

## 2020-03-11 LAB — SPECIMEN STATUS REPORT

## 2020-03-11 LAB — T3, FREE: T3, Free: 2.9 pg/mL (ref 2.0–4.4)

## 2020-03-14 ENCOUNTER — Other Ambulatory Visit: Payer: Self-pay | Admitting: Internal Medicine

## 2020-03-14 DIAGNOSIS — F4322 Adjustment disorder with anxiety: Secondary | ICD-10-CM

## 2020-03-14 DIAGNOSIS — G40909 Epilepsy, unspecified, not intractable, without status epilepticus: Secondary | ICD-10-CM

## 2020-04-14 ENCOUNTER — Other Ambulatory Visit: Payer: Self-pay | Admitting: Internal Medicine

## 2020-04-14 DIAGNOSIS — R252 Cramp and spasm: Secondary | ICD-10-CM

## 2020-05-10 DIAGNOSIS — Z125 Encounter for screening for malignant neoplasm of prostate: Secondary | ICD-10-CM | POA: Diagnosis not present

## 2020-05-10 DIAGNOSIS — R7303 Prediabetes: Secondary | ICD-10-CM | POA: Diagnosis not present

## 2020-05-10 DIAGNOSIS — Z79899 Other long term (current) drug therapy: Secondary | ICD-10-CM | POA: Diagnosis not present

## 2020-05-10 DIAGNOSIS — E230 Hypopituitarism: Secondary | ICD-10-CM | POA: Diagnosis not present

## 2020-05-10 DIAGNOSIS — E232 Diabetes insipidus: Secondary | ICD-10-CM | POA: Diagnosis not present

## 2020-05-12 ENCOUNTER — Other Ambulatory Visit: Payer: Self-pay | Admitting: Internal Medicine

## 2020-05-12 DIAGNOSIS — G40909 Epilepsy, unspecified, not intractable, without status epilepticus: Secondary | ICD-10-CM

## 2020-05-12 DIAGNOSIS — F4322 Adjustment disorder with anxiety: Secondary | ICD-10-CM

## 2020-05-21 LAB — HEMOGLOBIN A1C: Hemoglobin A1C: 6.1

## 2020-05-28 ENCOUNTER — Other Ambulatory Visit: Payer: Self-pay | Admitting: Internal Medicine

## 2020-05-28 DIAGNOSIS — M818 Other osteoporosis without current pathological fracture: Secondary | ICD-10-CM

## 2020-05-28 NOTE — Telephone Encounter (Signed)
Dr. Dareen Piano, Please correct Sig to 1 tablet Every 7 days.  Pharmacy transcribed this wrong on the refill request and it states to take 1  tablet for 7 days.  I called the pharmacy and was told patient was picking up a 90 day supply of weekly fosamax. Thanks, Higinio Roger, RN,BSN

## 2020-06-09 ENCOUNTER — Other Ambulatory Visit: Payer: Self-pay | Admitting: Internal Medicine

## 2020-06-09 DIAGNOSIS — F4322 Adjustment disorder with anxiety: Secondary | ICD-10-CM

## 2020-06-09 DIAGNOSIS — G40909 Epilepsy, unspecified, not intractable, without status epilepticus: Secondary | ICD-10-CM

## 2020-07-09 ENCOUNTER — Other Ambulatory Visit: Payer: Self-pay | Admitting: Internal Medicine

## 2020-07-09 DIAGNOSIS — G40909 Epilepsy, unspecified, not intractable, without status epilepticus: Secondary | ICD-10-CM

## 2020-07-09 DIAGNOSIS — R252 Cramp and spasm: Secondary | ICD-10-CM

## 2020-07-09 DIAGNOSIS — F4322 Adjustment disorder with anxiety: Secondary | ICD-10-CM

## 2020-08-07 ENCOUNTER — Other Ambulatory Visit: Payer: Self-pay | Admitting: Internal Medicine

## 2020-08-07 DIAGNOSIS — R252 Cramp and spasm: Secondary | ICD-10-CM

## 2020-08-10 ENCOUNTER — Other Ambulatory Visit: Payer: Self-pay | Admitting: Internal Medicine

## 2020-08-10 DIAGNOSIS — G40909 Epilepsy, unspecified, not intractable, without status epilepticus: Secondary | ICD-10-CM

## 2020-08-10 DIAGNOSIS — F4322 Adjustment disorder with anxiety: Secondary | ICD-10-CM

## 2020-08-11 NOTE — Telephone Encounter (Signed)
LORazepam (ATIVAN) 2 MG tablet, refill request @  [facility], [place] Phone:  [phone]  Fax:  [phone]     Requesting the med to be filled by today.

## 2020-08-13 NOTE — Telephone Encounter (Signed)
Received TC from patient's mother who states lorazepam is not at pharmacy.  TC to pharmacy, informed they have the RX, but it is not ready for pick up.  RN asked for pharmacy to get RX ready and was told they would have it ready in an hour.  Mother informed and very appreciative. SChaplin, RN,BSN

## 2020-08-28 ENCOUNTER — Ambulatory Visit: Payer: Medicare Other | Attending: Internal Medicine

## 2020-08-28 DIAGNOSIS — Z23 Encounter for immunization: Secondary | ICD-10-CM

## 2020-08-28 NOTE — Progress Notes (Signed)
   Covid-19 Vaccination Clinic  Name:  Nicholas Holt    MRN: [id number] DOB: June 11, 1981  08/28/2020  [name] was observed post Covid-19 immunization for 15 minutes without incident. He was provided with Vaccine Information Sheet and instruction to access the V-Safe system.   [name] was instructed to call 911 with any severe reactions post vaccine: Marland Kitchen Difficulty breathing  . Swelling of face and throat  . A fast heartbeat  . A bad rash all over body  . Dizziness and weakness

## 2020-09-01 DIAGNOSIS — Z23 Encounter for immunization: Secondary | ICD-10-CM | POA: Diagnosis not present

## 2020-09-03 ENCOUNTER — Other Ambulatory Visit: Payer: Self-pay | Admitting: Internal Medicine

## 2020-09-03 DIAGNOSIS — R252 Cramp and spasm: Secondary | ICD-10-CM

## 2020-09-08 ENCOUNTER — Other Ambulatory Visit: Payer: Self-pay | Admitting: Internal Medicine

## 2020-09-08 DIAGNOSIS — F4322 Adjustment disorder with anxiety: Secondary | ICD-10-CM

## 2020-09-08 DIAGNOSIS — G40909 Epilepsy, unspecified, not intractable, without status epilepticus: Secondary | ICD-10-CM

## 2020-09-09 NOTE — Telephone Encounter (Signed)
LORazepam (ATIVAN) 2 MG tablet, REFILL REQUEST @  [id number] [address], [place] [address] [place] Phone:  [phone]  Fax:  [phone]

## 2020-09-09 NOTE — Telephone Encounter (Signed)
Ativan refill sent to [facility] yesterday evening. Hubbard Hartshorn, BSN, RN-BC

## 2020-10-05 ENCOUNTER — Encounter: Payer: Self-pay | Admitting: Internal Medicine

## 2020-10-05 ENCOUNTER — Other Ambulatory Visit: Payer: Self-pay

## 2020-10-05 ENCOUNTER — Ambulatory Visit (INDEPENDENT_AMBULATORY_CARE_PROVIDER_SITE_OTHER): Payer: Medicare Other | Admitting: Internal Medicine

## 2020-10-05 DIAGNOSIS — F4322 Adjustment disorder with anxiety: Secondary | ICD-10-CM | POA: Diagnosis not present

## 2020-10-05 DIAGNOSIS — R030 Elevated blood-pressure reading, without diagnosis of hypertension: Secondary | ICD-10-CM | POA: Diagnosis not present

## 2020-10-05 DIAGNOSIS — G40909 Epilepsy, unspecified, not intractable, without status epilepticus: Secondary | ICD-10-CM | POA: Diagnosis not present

## 2020-10-05 DIAGNOSIS — M818 Other osteoporosis without current pathological fracture: Secondary | ICD-10-CM

## 2020-10-05 DIAGNOSIS — T380X5A Adverse effect of glucocorticoids and synthetic analogues, initial encounter: Secondary | ICD-10-CM

## 2020-10-05 DIAGNOSIS — R059 Cough, unspecified: Secondary | ICD-10-CM

## 2020-10-05 DIAGNOSIS — E230 Hypopituitarism: Secondary | ICD-10-CM

## 2020-10-05 DIAGNOSIS — Z0000 Encounter for general adult medical examination without abnormal findings: Secondary | ICD-10-CM

## 2020-10-05 MED ORDER — LORAZEPAM 2 MG PO TABS: 6.0000 mg | ORAL_TABLET | Freq: Every day | ORAL | 0 refills | Status: DC

## 2020-10-05 MED ORDER — VIRTUSSIN A/C 100-10 MG/5ML PO SOLN: 5.0000 mL | Freq: Every evening | ORAL | 0 refills | Status: DC | PRN

## 2020-10-05 MED ORDER — DDAVP 0.01 % NA SOLN: 10.0000 ug | Freq: Four times a day (QID) | NASAL | 11 refills | Status: DC

## 2020-10-05 NOTE — Assessment & Plan Note (Signed)
-  This problem is chronic and stable -Patient follows up with Dr. 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 Buddy Duty as an outpatient -We will refill his DDAVP at current dose since patient is running out of this medication.  I explained to the patient and his father that they should contact Dr. Buddy Duty for further refills of this -Patient did have his TSH, free T3 and T4 checked on last visit in April and his T3 and T4 levels were within normal.

## 2020-10-05 NOTE — Assessment & Plan Note (Signed)
-  This form is chronic and stable -Per patient's father, patient's blood pressures have been within normal limits at home -The blood pressure is mildly elevated today with SBP's in the 140s to 150s -I explained to the patient and his father to bring his home blood pressure cuff at his next visit so we can calibrate that with our blood pressure machines here.  If his blood pressure remains elevated would consider starting the patient on amlodipine for hypertension

## 2020-10-05 NOTE — Assessment & Plan Note (Signed)
-  This problem is chronic and stable -Patient denies any seizures and is doing well on his current regimen -We will refill his lorazepam 6 mg at night -No further work-up at this time

## 2020-10-05 NOTE — Patient Instructions (Signed)
-  It was a pleasure seeing you again today -I have put in refills for your cough syrup, nasal spray as well as your lorazepam -Please call us if you have any fevers or worsening cough -Please monitor your blood pressure closely at home and if it is persistently high let us know -Please call me if you have any questions or concerns

## 2020-10-05 NOTE — Progress Notes (Signed)
   Subjective:    Patient ID: Nicholas Holt, male    DOB: [birth year], 39 y.o.   MRN: [id number]  HPI  I have seen and examined this patient.  Patient is here for routine follow-up of his seizure disorder and panhypopituitarism.  Patient and father are present.  Father states that patient has been having some intermittent cough at night which is a chronic issue for him and would like a refill of his cough syrup.  Patient has also been having difficulty getting refills on his DDAVP as well as his lorazepam and they would like this filled today.  They deny any other complaints at this time and states that he is compliant with all his medications.  Review of Systems  Constitutional: Negative.   HENT: Negative.   Respiratory: Positive for cough. Negative for shortness of breath and wheezing.   Cardiovascular: Negative.   Gastrointestinal: Negative.   Musculoskeletal: Negative.   Neurological: Negative.   Psychiatric/Behavioral: Negative.        Objective:   Physical Exam Constitutional:      Appearance: Normal appearance.  HENT:     Head: Normocephalic and atraumatic.     Mouth/Throat:     Mouth: Mucous membranes are moist.     Pharynx: Oropharynx is clear. No oropharyngeal exudate.  Cardiovascular:     Rate and Rhythm: Normal rate and regular rhythm.     Heart sounds: Normal heart sounds.  Pulmonary:     Effort: Pulmonary effort is normal.     Breath sounds: Normal breath sounds. No wheezing or rales.  Abdominal:     General: Bowel sounds are normal. There is no distension.     Palpations: Abdomen is soft.     Tenderness: There is no abdominal tenderness.  Musculoskeletal:        General: No swelling or tenderness.  Lymphadenopathy:     Cervical: No cervical adenopathy.  Skin:    General: Skin is warm and dry.  Neurological:     General: No focal deficit present.     Mental Status: He is alert and oriented to person, place, and time. Mental status is at baseline.    Psychiatric:        Mood and Affect: Mood normal.        Behavior: Behavior normal.           Assessment & Plan:  Please see problem based charting for assessment and plan:

## 2020-10-05 NOTE — Assessment & Plan Note (Signed)
-  Patient did get his Covid booster -He also got his flu shot.  We will need to obtain documentation from this

## 2020-10-05 NOTE — Assessment & Plan Note (Signed)
-  Patient had a Z score of -2.3 at his last DEXA scan in February of this year which was mildly worse from his previous Z score of -2.2 -He was restarted on his alendronate which he is compliant with -I explained to the patient and his father to let Dr. Buddy Duty know that he is taking this medication -We will recheck DEXA scan in a year to see if there is improvement and if so would consider giving him another bisphosphonate holiday

## 2020-10-06 ENCOUNTER — Other Ambulatory Visit: Payer: Self-pay | Admitting: Internal Medicine

## 2020-10-06 DIAGNOSIS — R252 Cramp and spasm: Secondary | ICD-10-CM

## 2020-11-15 ENCOUNTER — Other Ambulatory Visit: Payer: Self-pay | Admitting: Internal Medicine

## 2020-11-15 DIAGNOSIS — G40909 Epilepsy, unspecified, not intractable, without status epilepticus: Secondary | ICD-10-CM

## 2020-11-15 DIAGNOSIS — F4322 Adjustment disorder with anxiety: Secondary | ICD-10-CM

## 2020-11-15 DIAGNOSIS — E230 Hypopituitarism: Secondary | ICD-10-CM

## 2020-11-27 ENCOUNTER — Other Ambulatory Visit: Payer: Self-pay | Admitting: Internal Medicine

## 2020-11-27 DIAGNOSIS — R252 Cramp and spasm: Secondary | ICD-10-CM

## 2020-12-09 DIAGNOSIS — E232 Diabetes insipidus: Secondary | ICD-10-CM | POA: Diagnosis not present

## 2020-12-09 DIAGNOSIS — E230 Hypopituitarism: Secondary | ICD-10-CM | POA: Diagnosis not present

## 2020-12-09 DIAGNOSIS — R7309 Other abnormal glucose: Secondary | ICD-10-CM | POA: Diagnosis not present

## 2020-12-09 DIAGNOSIS — Z125 Encounter for screening for malignant neoplasm of prostate: Secondary | ICD-10-CM | POA: Diagnosis not present

## 2020-12-17 ENCOUNTER — Other Ambulatory Visit: Payer: Self-pay | Admitting: Internal Medicine

## 2020-12-17 DIAGNOSIS — N289 Disorder of kidney and ureter, unspecified: Secondary | ICD-10-CM | POA: Diagnosis not present

## 2020-12-17 DIAGNOSIS — G40909 Epilepsy, unspecified, not intractable, without status epilepticus: Secondary | ICD-10-CM

## 2020-12-17 DIAGNOSIS — F4322 Adjustment disorder with anxiety: Secondary | ICD-10-CM

## 2021-01-10 ENCOUNTER — Other Ambulatory Visit: Payer: Self-pay | Admitting: Internal Medicine

## 2021-01-10 DIAGNOSIS — E232 Diabetes insipidus: Secondary | ICD-10-CM | POA: Diagnosis not present

## 2021-01-10 DIAGNOSIS — N1831 Chronic kidney disease, stage 3a: Secondary | ICD-10-CM

## 2021-01-10 DIAGNOSIS — E039 Hypothyroidism, unspecified: Secondary | ICD-10-CM | POA: Diagnosis not present

## 2021-01-10 DIAGNOSIS — M810 Age-related osteoporosis without current pathological fracture: Secondary | ICD-10-CM | POA: Diagnosis not present

## 2021-01-11 ENCOUNTER — Encounter: Payer: Self-pay | Admitting: Internal Medicine

## 2021-01-11 ENCOUNTER — Ambulatory Visit (INDEPENDENT_AMBULATORY_CARE_PROVIDER_SITE_OTHER): Payer: Medicare Other | Admitting: Internal Medicine

## 2021-01-11 VITALS — BP 151/99 | HR 81 | Temp 97.9°F | Ht 68.0 in | Wt 233.5 lb

## 2021-01-11 DIAGNOSIS — G40909 Epilepsy, unspecified, not intractable, without status epilepticus: Secondary | ICD-10-CM | POA: Diagnosis not present

## 2021-01-11 DIAGNOSIS — R252 Cramp and spasm: Secondary | ICD-10-CM

## 2021-01-11 DIAGNOSIS — N179 Acute kidney failure, unspecified: Secondary | ICD-10-CM | POA: Diagnosis not present

## 2021-01-11 DIAGNOSIS — R030 Elevated blood-pressure reading, without diagnosis of hypertension: Secondary | ICD-10-CM | POA: Diagnosis not present

## 2021-01-11 DIAGNOSIS — T380X5A Adverse effect of glucocorticoids and synthetic analogues, initial encounter: Secondary | ICD-10-CM

## 2021-01-11 DIAGNOSIS — Z0000 Encounter for general adult medical examination without abnormal findings: Secondary | ICD-10-CM

## 2021-01-11 DIAGNOSIS — M818 Other osteoporosis without current pathological fracture: Secondary | ICD-10-CM

## 2021-01-11 DIAGNOSIS — E230 Hypopituitarism: Secondary | ICD-10-CM

## 2021-01-11 HISTORY — DX: Acute kidney failure, unspecified: N17.9

## 2021-01-11 LAB — HEMOGLOBIN A1C: Hemoglobin A1C: 6.2

## 2021-01-11 NOTE — Assessment & Plan Note (Signed)
-  Patient had a T score of -2.3 at his last DEXA scan was restarted on alendronate -Would consider rechecking his DEXA scan later this year and possibly giving the patient another bisphosphonate holiday if his DEXA scan remained stable -No further work-up at this time

## 2021-01-11 NOTE — Patient Instructions (Signed)
-  Was a pleasure seeing you again today -Please call me for any questions or concerns or if you need any refills -We will need to keep a close eye on your blood pressure.  If it remains high we will need to start you on medications for this -You were noted to have an abnormal renal function with Dr. Buddy Duty.  We will repeat this test today to see if this is improved -Please follow-up in 3 months

## 2021-01-11 NOTE — Assessment & Plan Note (Signed)
-  On routine blood work with Dr. Buddy Duty in January patient was noted to have an elevated creatinine of 1.4.  This was repeated on February 2 and his creatinine at that time was 1.52 -Patient denies any urinary complaints -Dr. Buddy Duty had referred him to nephrology.  Per patient's father, the nephrologist said everything was fine at his appointment yesterday but no blood work was drawn -Will recheck his BMP today

## 2021-01-11 NOTE — Assessment & Plan Note (Signed)
-  This problem is chronic and stable -Patient denies any recurrent leg cramps at this time -We will continue Flexeril as needed -No further work-up for now

## 2021-01-11 NOTE — Assessment & Plan Note (Signed)
-  This problem is chronic and stable -Patient is not had any further seizure episodes and is doing well on his current regimen -We will continue with lorazepam 6 mg at night -No further work-up at this time

## 2021-01-11 NOTE — Progress Notes (Signed)
   Subjective:    Patient ID: Nicholas Holt, male    DOB: 10/05/1981, 40 y.o.   MRN: [id number]  HPI  I have seen and examined this patient.  Patient is here for routine follow-up of his seizure disorder and panhypopituitarism.  Patient states that he feels well and denies any new complaints.  Of note, patient's father states that he was noted to have abnormal labs with Dr. Buddy Duty (his endocrinologist) and had to follow-up with a nephrologist yesterday.  Patient states that he is compliant with his medications and feels well.  Review of Systems  Constitutional: Negative.   HENT: Negative.   Respiratory: Negative.   Cardiovascular: Negative.   Gastrointestinal: Negative.   Musculoskeletal: Negative.   Neurological: Negative.   Psychiatric/Behavioral: Negative.        Objective:   Physical Exam Constitutional:      Appearance: Normal appearance.  HENT:     Head: Normocephalic and atraumatic.  Cardiovascular:     Rate and Rhythm: Normal rate and regular rhythm.     Heart sounds: Normal heart sounds.  Pulmonary:     Effort: Pulmonary effort is normal.     Breath sounds: Normal breath sounds. No wheezing or rales.  Abdominal:     General: Bowel sounds are normal. There is no distension.     Palpations: Abdomen is soft.     Tenderness: There is no abdominal tenderness.  Musculoskeletal:        General: No swelling or tenderness.     Cervical back: Neck supple.  Lymphadenopathy:     Cervical: No cervical adenopathy.  Neurological:     Mental Status: He is alert and oriented to person, place, and time. Mental status is at baseline.  Psychiatric:        Mood and Affect: Mood normal.        Behavior: Behavior normal.           Assessment & Plan:  Please see problem based charting for assessment and plan:

## 2021-01-11 NOTE — Assessment & Plan Note (Addendum)
-  We will screen for hepatitis C

## 2021-01-11 NOTE — Assessment & Plan Note (Signed)
-  This problem is chronic and stable -Patient's free T4 level was normal when he followed up with Dr. 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

## 2021-01-11 NOTE — Assessment & Plan Note (Signed)
-  Patient's blood pressure remains elevated at his visit today with systolics in the 276F to 943Q -Patient's father states that his blood pressure was within normal limits at his nephrologist appointment yesterday -They do have a blood pressure cuff at home -I have given the father paper logs so that they can record the patient's blood pressure daily and bring that at the next appointment so we can see how his blood pressure has been running at home -We will consider starting the patient on amlodipine or thiazide diuretic if his blood pressures at home are also elevated -No further work-up at this time

## 2021-01-12 LAB — BMP8+ANION GAP
Anion Gap: 18 mmol/L (ref 10.0–18.0)
BUN/Creatinine Ratio: 7 — ABNORMAL LOW (ref 9–20)
BUN: 9 mg/dL (ref 6–24)
CO2: 22 mmol/L (ref 20–29)
Calcium: 9.2 mg/dL (ref 8.7–10.2)
Chloride: 99 mmol/L (ref 96–106)
Creatinine, Ser: 1.36 mg/dL — ABNORMAL HIGH (ref 0.76–1.27)
Glucose: 84 mg/dL (ref 65–99)
Potassium: 4.5 mmol/L (ref 3.5–5.2)
Sodium: 139 mmol/L (ref 134–144)
eGFR: 67 mL/min/{1.73_m2} (ref >59)

## 2021-01-12 LAB — HEPATITIS C ANTIBODY: Hep C Virus Ab: <0.1 s/co ratio (ref 0.0–0.9)

## 2021-01-13 ENCOUNTER — Telehealth: Payer: Self-pay

## 2021-01-13 ENCOUNTER — Telehealth: Payer: Self-pay | Admitting: Internal Medicine

## 2021-01-13 NOTE — Telephone Encounter (Signed)
Patient's PCP has already discussed results with patient and his mother.

## 2021-01-13 NOTE — Telephone Encounter (Signed)
Requesting lab results, please call pt back.  

## 2021-01-13 NOTE — Telephone Encounter (Signed)
I called the patient to discuss the results of his blood work with him.  I spoke with the patient's mother and we talked about the results of his blood work.  Hepatitis C antibody was negative.  Patient's creatinine is mildly elevated at 1.36 which is improved from his last 2 BMPs at Dr. Cindra Eves office with a creatinine of 1.4-1.5.  Patient is already following up with nephrology for this and they will image his kidneys and do further work-up as necessary.  Patient's mother is encouraging the patient to drink more water since he does not drink much water at home.  Patient's mother was also concerned about his blood pressure which was elevated here but normal at the nephrologist office.  I explained to the mother that we gave him a blood pressure log yesterday and that she should check his blood pressure every day and bring the log in at his next visit as well as a blood pressure cuff so we can compare it to our machine.  Patient's mother expressed understanding and is in agreement with plan.  No further work-up at this time.

## 2021-01-17 ENCOUNTER — Telehealth: Payer: Self-pay | Admitting: Internal Medicine

## 2021-01-17 ENCOUNTER — Other Ambulatory Visit: Payer: Self-pay | Admitting: Internal Medicine

## 2021-01-17 DIAGNOSIS — G40909 Epilepsy, unspecified, not intractable, without status epilepticus: Secondary | ICD-10-CM

## 2021-01-17 DIAGNOSIS — I10 Essential (primary) hypertension: Secondary | ICD-10-CM

## 2021-01-17 DIAGNOSIS — F4322 Adjustment disorder with anxiety: Secondary | ICD-10-CM

## 2021-01-17 NOTE — Telephone Encounter (Signed)
Duplicate message. 

## 2021-01-17 NOTE — Telephone Encounter (Signed)
Please call back and speak with Nicholas Holt Mother @ [phone] or pt's father Nicholas Holt  @ [phone].  [facility] has [place] [facility] and wants to report his 3 reading 150/100, 140/102 and 142/100.  Pls call back about taking BP medication.

## 2021-01-17 NOTE — Telephone Encounter (Signed)
RTC, pt's mother is requesting that pt be started on b/p medicine.  States it was discussed at [place] with PCP and she had wanted to wait.  The b/p readings :  150/100, 140/102, and 142/100 were all taken at the fire station and were taken on 3 different visits.  LOV with PCP 01/11/21.  Will forward to PCP to advise. Thank you, SChaplin, RN,BSN

## 2021-01-18 ENCOUNTER — Other Ambulatory Visit: Payer: Self-pay | Admitting: Internal Medicine

## 2021-01-18 DIAGNOSIS — I10 Essential (primary) hypertension: Secondary | ICD-10-CM

## 2021-01-18 MED ORDER — AMLODIPINE BESYLATE 5 MG PO TABS
5.0000 mg | ORAL_TABLET | Freq: Every day | ORAL | 0 refills | Status: DC
Start: 2021-01-18 — End: 2021-01-18

## 2021-01-18 NOTE — Telephone Encounter (Signed)
Can we schedule this patient for follow-up visit in [facility] in 2 weeks for repeat blood pressure check?  Thank you!

## 2021-01-18 NOTE — Assessment & Plan Note (Signed)
-  I received a call from the patient's mother who states that the patient's blood pressure has been running high (150/100, 140/102 and 142/100 on 3 separate visits to the fire station) -Patient was noted to have an elevated blood pressure when he saw me in clinic on March 1 but family wanted to hold off on starting blood pressure medication at that time till they could see what his blood pressure was running at home -Given that he has persistently elevated blood pressures we will start the patient on amlodipine 5 mg -Patient will need to follow-up in [facility] in 2 to 3 weeks for repeat blood pressure check -No further work-up at this time -Case discussed with father over phone who is in agreement with plan.

## 2021-01-19 ENCOUNTER — Other Ambulatory Visit: Payer: Self-pay | Admitting: Internal Medicine

## 2021-01-19 DIAGNOSIS — M818 Other osteoporosis without current pathological fracture: Secondary | ICD-10-CM

## 2021-01-19 DIAGNOSIS — I10 Essential (primary) hypertension: Secondary | ICD-10-CM

## 2021-01-19 DIAGNOSIS — T380X5A Adverse effect of glucocorticoids and synthetic analogues, initial encounter: Secondary | ICD-10-CM

## 2021-01-21 NOTE — Telephone Encounter (Signed)
Spoke with the patient's MOther and she would like to bring him in on 01/25/2021 and has sch an appt for 9:45 am with Dr. Gilford Rile.

## 2021-01-23 ENCOUNTER — Other Ambulatory Visit: Payer: Self-pay | Admitting: Internal Medicine

## 2021-01-23 DIAGNOSIS — R252 Cramp and spasm: Secondary | ICD-10-CM

## 2021-01-25 ENCOUNTER — Encounter: Payer: Self-pay | Admitting: Internal Medicine

## 2021-01-25 ENCOUNTER — Other Ambulatory Visit: Payer: Self-pay

## 2021-01-25 ENCOUNTER — Ambulatory Visit (INDEPENDENT_AMBULATORY_CARE_PROVIDER_SITE_OTHER): Payer: Medicare Other | Admitting: Internal Medicine

## 2021-01-25 ENCOUNTER — Encounter: Payer: Self-pay | Admitting: *Deleted

## 2021-01-25 DIAGNOSIS — I10 Essential (primary) hypertension: Secondary | ICD-10-CM

## 2021-01-25 NOTE — Progress Notes (Signed)
   CC: Blood Pressure Follow Up   HPI:  [name] is a 40 y.o. M/F, with a PMH noted below, who presents to the clinic blood pressure follow up. To see the management of their acute and chronic conditions, please see the A&P note under the Encounters tab.   Past Medical History:  Diagnosis Date  . Anxiety   . Attention deficit disorder 08/08/2006  . Bilateral leg cramps 11/25/2006  . Blood transfusion without reported diagnosis 1997   Chemotherapy associated  . Cough 12/08/2015  . 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   . H/O malignant neoplasm of brain 09/22/2014   Suprasellar germinoma treated at [facility] in 1997; received chemotherapy (cisplatin, etoposide, vincristine, and cyclophosphomide) and radiation therapy.  Complicated by left sided motor tic disorder and panhypopituitarism.   Marland Kitchen Hyperlipidemia 08/08/2006  . Hypertension   . Night sweats 02/06/2018  . Obesity (BMI 30.0-34.9) 07/09/2015  . 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  Includes central diabetes insipidus, secondary hypothyroidism, secondary adrenal insufficiency, and secondary hypogonadism.   Marland Kitchen Perennial allergic rhinitis 07/09/2015  . Seizure disorder ([place]) 08/08/2006   Predated brain tumor.  Generalized seizure ocurred after running out of lorazepam post-tumor.   . 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 Treated with alendronate from 2009-2016. DEXA (03/16/2008): L Spine T -2.0. L fem neck T -0.9, R fem neck T -1.9.  DEXA (10/15/2014): L Spine T -1.3, L fem neck T -0.6, R fem neck T -1.8.  Bisphosphonate holiday 06/2015.  Repeat DEXA scan in 10/2016 to reassess.    Review of Systems:   Review of Systems  Constitutional: Negative for chills and fever.  Cardiovascular: Negative for chest pain, palpitations and leg swelling.  Gastrointestinal:  Negative for abdominal pain, constipation, diarrhea, nausea and vomiting.  Neurological: Negative for dizziness and headaches.     Physical Exam:  Vitals:   01/25/21 0955 01/25/21 1015  BP: (!) 147/96 (!) 141/100  Pulse: 86   Temp: 98.5 F (36.9 C)   TempSrc: Oral   SpO2: 100%   Weight: 233 lb 3.2 oz (105.8 kg)   Height: 5\' 8"  (1.727 m)    Physical Exam Constitutional:      General: He is not in acute distress.    Appearance: Normal appearance. He is not ill-appearing, toxic-appearing or diaphoretic.  HENT:     Head: Normocephalic.  Cardiovascular:     Rate and Rhythm: Normal rate and regular rhythm.     Pulses: Normal pulses.     Heart sounds: Normal heart sounds. No murmur heard. No friction rub. No gallop.   Pulmonary:     Effort: Pulmonary effort is normal.     Breath sounds: Normal breath sounds. No wheezing, rhonchi or rales.  Abdominal:     General: Abdomen is flat. Bowel sounds are normal.     Palpations: Abdomen is soft.     Tenderness: There is no abdominal tenderness.  Musculoskeletal:     Right lower leg: No edema.     Left lower leg: No edema.  Neurological:     Mental Status: He is alert.      Assessment & Plan:   See Encounters Tab for problem based charting.  Patient discussed with Dr. Jimmye Norman

## 2021-01-25 NOTE — Patient Instructions (Signed)
Mr. Sadowski,   It was a pleasure meeting you! Today we discussed your blood pressure. We will continue on your amlodipine at 5 mg daily. We will see you back in 3 weeks for a blood pressure check. Please continue getting your blood pressure checked at the fire station. Have a good day!  [name], MD

## 2021-01-25 NOTE — Assessment & Plan Note (Signed)
Patient presented today, with his father, with elevated blood pressures. At today's visit his vitals were:  Vitals with BMI 01/25/2021 01/25/2021 01/11/2021  Height - 5\' 8"  5\' 8"   Weight - 233 lbs 3 oz 233 lbs 8 oz  BMI - 47.09 29.57  Systolic 473 403 709  Diastolic 643 96 99  Pulse - 86 81   Per his father, Nicholas Holt has been getting his blood pressure intermittently checked at the local fire station. He states that the health care personnel that his pressures are in the 140s. We discussed increasing Nicholas Holt Norvasc, but both the patient and his father would like to defer increasing or starting an additional medication at this time. We discussed monitoring, and if pressures were still elevated at his next visit to increase his current dose of amlodipine.  - Follow up in 2-3 weeks - Continue Norvasc 5 mg daily, if pressures continue to be elevated, consider increasing his dose to 10 mg daily.

## 2021-01-26 ENCOUNTER — Ambulatory Visit
Admission: RE | Admit: 2021-01-26 | Discharge: 2021-01-26 | Disposition: A | Discharge status: Home / Self Care | Payer: Medicare Other | Source: Ambulatory Visit | Attending: Internal Medicine | Admitting: Internal Medicine

## 2021-01-26 DIAGNOSIS — N1831 Chronic kidney disease, stage 3a: Secondary | ICD-10-CM | POA: Diagnosis not present

## 2021-01-26 NOTE — Progress Notes (Signed)
Internal Medicine Clinic Attending ? ?Case discussed with Dr. Winters  At the time of the visit.  We reviewed the resident?s history and exam and pertinent patient test results.  I agree with the assessment, diagnosis, and plan of care documented in the resident?s note.  ?

## 2021-01-26 NOTE — Progress Notes (Signed)
Hello Nicholas Holt! I am unsure why this was sent to me. Everything ok?

## 2021-02-10 ENCOUNTER — Encounter: Payer: Self-pay | Admitting: *Deleted

## 2021-02-10 NOTE — Progress Notes (Unsigned)

## 2021-02-15 ENCOUNTER — Encounter: Payer: Self-pay | Admitting: Internal Medicine

## 2021-02-15 ENCOUNTER — Other Ambulatory Visit: Payer: Self-pay

## 2021-02-15 ENCOUNTER — Ambulatory Visit (INDEPENDENT_AMBULATORY_CARE_PROVIDER_SITE_OTHER): Payer: Medicare Other | Admitting: Internal Medicine

## 2021-02-15 DIAGNOSIS — I10 Essential (primary) hypertension: Secondary | ICD-10-CM

## 2021-02-15 NOTE — Assessment & Plan Note (Signed)
Vitals:   02/15/21 0828  BP: 120/81  Pulse: 84  Temp: 97.7 F (36.5 C)  SpO2: 100%   Patient walked in today for blood pressure check/follow-up.  He states that he has been intermittently getting his blood pressure checked at a local fire station and reports that his blood pressure has been well controlled; however unable to confirm blood pressure readings.  He denies any chest pain, shortness of breath, headaches or lower extremity swelling.  He is tolerating the amlodipine 5 mg well without any notable side effects.  Blood pressure today looks excellent.  We will continue current regimen and follow-up in about 3 months.  Plan: Continue amlodipine 5 mg

## 2021-02-15 NOTE — Patient Instructions (Signed)
It was a pleasure meeting you today. Your blood pressure looks great! I want you to continue taking Amlodipine 5 mg.

## 2021-02-15 NOTE — Progress Notes (Signed)
CC: blood pressure  HPI:  Nicholas Holt is a 40 y.o. with a past medical history listed below presenting for blood pressure follow-up. For details of today's visit and the status of his chronic medical issues please refer to the assessment and plan.   Past Medical History:  Diagnosis Date  . Anxiety   . Attention deficit disorder 08/08/2006  . Bilateral leg cramps 11/25/2006  . Blood transfusion without reported diagnosis 1997   Chemotherapy associated  . Cough 12/08/2015  . 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   . H/O malignant neoplasm of brain 09/22/2014   Suprasellar germinoma treated at [facility] in 1997; received chemotherapy (cisplatin, etoposide, vincristine, and cyclophosphomide) and radiation therapy.  Complicated by left sided motor tic disorder and panhypopituitarism.   Marland Kitchen Hyperlipidemia 08/08/2006  . Hypertension   . Night sweats 02/06/2018  . Obesity (BMI 30.0-34.9) 07/09/2015  . 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  Includes central diabetes insipidus, secondary hypothyroidism, secondary adrenal insufficiency, and secondary hypogonadism.   Marland Kitchen Perennial allergic rhinitis 07/09/2015  . Seizure disorder ([place]) 08/08/2006   Predated brain tumor.  Generalized seizure ocurred after running out of lorazepam post-tumor.   . 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 Treated with alendronate from 2009-2016. DEXA (03/16/2008): L Spine T -2.0. L fem neck T -0.9, R fem neck T -1.9.  DEXA (10/15/2014): L Spine T -1.3, L fem neck T -0.6, R fem neck T -1.8.  Bisphosphonate holiday 06/2015.  Repeat DEXA scan in 10/2016 to reassess.    Review of Systems:    Review of Systems  Constitutional: Negative for malaise/fatigue.  Eyes: Negative for blurred vision and double vision.  Respiratory: Negative for cough, sputum  production and shortness of breath.   Cardiovascular: Negative for chest pain and leg swelling.  Gastrointestinal: Negative for abdominal pain, nausea and vomiting.  Neurological: Negative for dizziness, weakness and headaches.     Physical Exam:  Vitals:   02/15/21 0828  BP: 120/81  Pulse: 84  Temp: 97.7 F (36.5 C)  TempSrc: Oral  SpO2: 100%  Weight: 228 lb 14.4 oz (103.8 kg)   Physical Exam Vitals reviewed.  Constitutional:      General: He is not in acute distress.    Appearance: Normal appearance. He is not ill-appearing.  Cardiovascular:     Rate and Rhythm: Normal rate and regular rhythm.     Pulses: Normal pulses.     Heart sounds: Normal heart sounds. No murmur heard. No friction rub. No gallop.   Pulmonary:     Effort: Pulmonary effort is normal. No respiratory distress.     Breath sounds: No wheezing or rales.  Abdominal:     General: Abdomen is flat. Bowel sounds are normal. There is no distension.     Palpations: Abdomen is soft.     Tenderness: There is no abdominal tenderness. There is no guarding.  Musculoskeletal:        General: No swelling or tenderness.     Right lower leg: No edema.     Left lower leg: No edema.  Skin:    General: Skin is warm and dry.  Neurological:     Mental Status: He is alert and oriented to person, place, and time.  Psychiatric:        Mood and Affect: Mood normal.        Behavior: Behavior normal.  Thought Content: Thought content normal.        Judgment: Judgment normal.     Assessment & Plan:   See Encounters Tab for problem based charting.  Patient discussed with Dr. Jimmye Norman.

## 2021-02-16 NOTE — Progress Notes (Unsigned)
Things That May Be Affecting Your Health:  Alcohol  Hearing loss  Pain    Depression  Home Safety  Sexual Health   Diabetes  Lack of physical activity  Stress   Difficulty with daily activities  Loneliness  Tiredness   Drug use  Medicines  Tobacco use   Falls  Motor Vehicle Safety x Weight  x Food choices  Oral Health  Other    YOUR PERSONALIZED HEALTH PLAN : 1. Schedule your next subsequent Medicare Wellness visit in one year 2. Attend all of your regular appointments to address your medical issues 3. Complete the preventative screenings and services   Annual Wellness Visit   Medicare Covered Preventative Screenings and [place] Men and Women Who How Often Need? Date of Last Service Action  Abdominal Aortic Aneurysm Adults with AAA risk factors Once      Alcohol Misuse and Counseling All Adults Screening once a year if no alcohol misuse. Counseling up to 4 face to face sessions.     Bone Density Measurement  Adults at risk for osteoporosis Once every 2 yrs      Lipid Panel Z13.6 All adults without CV disease Once every 5 yrs       Colorectal Cancer   Stool sample or  Colonoscopy All adults 45 and older   Once every year  Every 10 years        Depression All Adults Once a year  Today   Diabetes Screening Blood glucose, post glucose load, or GTT Z13.1  All adults at risk  Pre-diabetics  Once per year  Twice per year      Diabetes  Self-Management Training All adults Diabetics 10 hrs first year; 2 hours subsequent years. Requires Copay     Glaucoma  Diabetics  Family history of glaucoma  African Americans 85 yrs +  Hispanic Americans 33 yrs + Annually - requires coppay      Hepatitis C Z72.89 or F19.20  High Risk for HCV  Born between 1945 and 1965  Annually  Once      HIV Z11.4 All adults based on risk  Annually btw ages 27 & 56 regardless of risk  Annually > 65 yrs if at increased risk      Lung Cancer Screening  Asymptomatic adults aged 31-77 with 30 pack yr history and current smoker OR quit within the last 15 yrs Annually Must have counseling and shared decision making documentation before first screen      Medical Nutrition Therapy Adults with   Diabetes  Renal disease  Kidney transplant within past 3 yrs 3 hours first year; 2 hours subsequent years     Obesity and Counseling All adults Screening once a year Counseling if BMI 30 or higher x Today   Tobacco Use Counseling Adults who use tobacco  Up to 8 visits in one year     Vaccines Z23  Hepatitis B  Influenza   Pneumonia  Adults   Once  Once every flu season  Two different vaccines separated by one year     Next Annual Wellness Visit People with Medicare Every year  Today     Services & Screenings Women Who How Often Need  Date of Last Service Action  Mammogram  Z12.31 Women over 58 One baseline ages 38-39. Annually ager 40 yrs+      Pap tests All women Annually if high risk. Every 2 yrs for normal risk women  Screening for cervical cancer with   Pap (Z01.419 nl or Z01.411abnl) &  HPV Z11.51 Women aged 18 to 60 Once every 5 yrs     Screening pelvic and breast exams All women Annually if high risk. Every 2 yrs for normal risk women     Sexually Transmitted Diseases  Chlamydia  Gonorrhea  Syphilis All at risk adults Annually for non pregnant females at increased risk         [place] Men Who How Ofter Need  Date of Last Service Action  Prostate Cancer - DRE & PSA Men over 50 Annually.  DRE might require a copay.        Sexually Transmitted Diseases  Syphilis All at risk adults Annually for men at increased risk      Health Maintenance List Health Maintenance  Topic Date Due  . COVID-19 Vaccine (4 - Booster for Pfizer series) 02/26/2021  . INFLUENZA VACCINE  06/13/2021  . TETANUS/TDAP  08/09/2021  . Hepatitis C Screening  Completed  . HIV Screening  Completed  . HPV VACCINES  Aged  Out

## 2021-02-18 NOTE — Progress Notes (Signed)
Internal Medicine Clinic Attending ° °Case discussed with Dr. Rehman  At the time of the visit.  We reviewed the resident’s history and exam and pertinent patient test results.  I agree with the assessment, diagnosis, and plan of care documented in the resident’s note.  ° °

## 2021-02-27 ENCOUNTER — Other Ambulatory Visit: Payer: Self-pay | Admitting: Internal Medicine

## 2021-02-27 DIAGNOSIS — G40909 Epilepsy, unspecified, not intractable, without status epilepticus: Secondary | ICD-10-CM

## 2021-02-27 DIAGNOSIS — F4322 Adjustment disorder with anxiety: Secondary | ICD-10-CM

## 2021-03-31 ENCOUNTER — Ambulatory Visit: Payer: Medicare Other | Admitting: Student

## 2021-03-31 ENCOUNTER — Other Ambulatory Visit: Payer: Self-pay

## 2021-04-10 ENCOUNTER — Other Ambulatory Visit: Payer: Self-pay | Admitting: Internal Medicine

## 2021-04-10 DIAGNOSIS — G40909 Epilepsy, unspecified, not intractable, without status epilepticus: Secondary | ICD-10-CM

## 2021-04-10 DIAGNOSIS — F4322 Adjustment disorder with anxiety: Secondary | ICD-10-CM

## 2021-05-01 ENCOUNTER — Other Ambulatory Visit: Payer: Self-pay | Admitting: Internal Medicine

## 2021-05-01 DIAGNOSIS — R252 Cramp and spasm: Secondary | ICD-10-CM

## 2021-05-19 ENCOUNTER — Other Ambulatory Visit: Payer: Self-pay | Admitting: Internal Medicine

## 2021-05-19 DIAGNOSIS — G40909 Epilepsy, unspecified, not intractable, without status epilepticus: Secondary | ICD-10-CM

## 2021-05-19 DIAGNOSIS — F4322 Adjustment disorder with anxiety: Secondary | ICD-10-CM

## 2021-05-31 ENCOUNTER — Encounter: Payer: Medicare Other | Admitting: Internal Medicine

## 2021-06-09 DIAGNOSIS — E232 Diabetes insipidus: Secondary | ICD-10-CM | POA: Diagnosis not present

## 2021-06-09 DIAGNOSIS — R7303 Prediabetes: Secondary | ICD-10-CM | POA: Diagnosis not present

## 2021-06-09 DIAGNOSIS — E669 Obesity, unspecified: Secondary | ICD-10-CM | POA: Diagnosis not present

## 2021-06-09 DIAGNOSIS — Z125 Encounter for screening for malignant neoplasm of prostate: Secondary | ICD-10-CM | POA: Diagnosis not present

## 2021-06-09 DIAGNOSIS — E230 Hypopituitarism: Secondary | ICD-10-CM | POA: Diagnosis not present

## 2021-07-04 ENCOUNTER — Other Ambulatory Visit: Payer: Self-pay | Admitting: Internal Medicine

## 2021-07-04 DIAGNOSIS — R252 Cramp and spasm: Secondary | ICD-10-CM

## 2021-07-05 ENCOUNTER — Other Ambulatory Visit: Payer: Self-pay | Admitting: Internal Medicine

## 2021-07-05 DIAGNOSIS — M818 Other osteoporosis without current pathological fracture: Secondary | ICD-10-CM

## 2021-07-05 DIAGNOSIS — T380X5A Adverse effect of glucocorticoids and synthetic analogues, initial encounter: Secondary | ICD-10-CM

## 2021-07-07 ENCOUNTER — Other Ambulatory Visit: Payer: Self-pay | Admitting: *Deleted

## 2021-07-07 DIAGNOSIS — G40909 Epilepsy, unspecified, not intractable, without status epilepticus: Secondary | ICD-10-CM

## 2021-07-07 DIAGNOSIS — F4322 Adjustment disorder with anxiety: Secondary | ICD-10-CM

## 2021-07-07 MED ORDER — LORAZEPAM 2 MG PO TABS: 6.0000 mg | ORAL_TABLET | Freq: Every day | ORAL | 0 refills | Status: DC

## 2021-07-07 NOTE — Telephone Encounter (Signed)
Mom notified. She was very [employer].

## 2021-07-07 NOTE — Telephone Encounter (Signed)
Patient's mother called in stating patient has only a few more tabs of lorazepam left and w/o it he will have a seizure. Pharmacy told her that we denied this request. No request in Epic per chart review.

## 2021-07-12 DIAGNOSIS — E669 Obesity, unspecified: Secondary | ICD-10-CM | POA: Diagnosis not present

## 2021-07-12 DIAGNOSIS — E230 Hypopituitarism: Secondary | ICD-10-CM | POA: Diagnosis not present

## 2021-07-12 DIAGNOSIS — R7309 Other abnormal glucose: Secondary | ICD-10-CM | POA: Diagnosis not present

## 2021-07-12 DIAGNOSIS — Z125 Encounter for screening for malignant neoplasm of prostate: Secondary | ICD-10-CM | POA: Diagnosis not present

## 2021-07-12 DIAGNOSIS — E232 Diabetes insipidus: Secondary | ICD-10-CM | POA: Diagnosis not present

## 2021-07-13 ENCOUNTER — Other Ambulatory Visit: Payer: Self-pay | Admitting: Internal Medicine

## 2021-07-13 DIAGNOSIS — E230 Hypopituitarism: Secondary | ICD-10-CM

## 2021-07-26 ENCOUNTER — Telehealth: Payer: Self-pay | Admitting: *Deleted

## 2021-07-26 ENCOUNTER — Other Ambulatory Visit: Payer: Self-pay

## 2021-07-26 ENCOUNTER — Ambulatory Visit (INDEPENDENT_AMBULATORY_CARE_PROVIDER_SITE_OTHER): Payer: Medicare Other | Admitting: Student

## 2021-07-26 DIAGNOSIS — Z9114 Patient's other noncompliance with medication regimen: Secondary | ICD-10-CM

## 2021-07-26 DIAGNOSIS — R413 Other amnesia: Secondary | ICD-10-CM

## 2021-07-26 DIAGNOSIS — Z85841 Personal history of malignant neoplasm of brain: Secondary | ICD-10-CM

## 2021-07-26 NOTE — Telephone Encounter (Signed)
Patient's mother called in requesting an MRI to be done to determine if "tumor" has returned. States she has noticed changes in patient but would not specify as he was present. States "same symptoms as when he had the tumor before." Tele appt given today with [employer].

## 2021-07-27 NOTE — Progress Notes (Signed)
   CC: memory changes  This is a telephone encounter between Nicholas Holt and Nicholas Holt on 07/27/2021 for memory changes. The visit was conducted with the patient's mother located at home and Nicholas Holt at [facility]. The patient's identity was confirmed using their DOB and current address. The his/her legal guardian has consented to being evaluated through a telephone encounter and understands the associated risks (an examination cannot be done and the patient may need to come in for an appointment) / benefits (allows the patient to remain at home, decreasing exposure to coronavirus). I personally spent 20 minutes on medical discussion.   HPI:  Nicholas Holt is a 40 y.o. 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  Mother called into clinic today to discuss concerns for worsening memory issues that seem similar to her prior symptoms when he first found to have a brain mass.  Please see A&P for assessment of the patient's acute and chronic medical conditions.   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    H/O malignant neoplasm of brain 09/22/2014   Suprasellar germinoma treated at [facility] in 1997; received chemotherapy (cisplatin, etoposide, vincristine, and cyclophosphomide) and radiation therapy.  Complicated by left sided motor tic disorder and panhypopituitarism.    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  Includes central diabetes insipidus, secondary  hypothyroidism, secondary adrenal insufficiency, and secondary hypogonadism.    Perennial allergic rhinitis 07/09/2015   Seizure disorder ([place]) 08/08/2006   Predated brain tumor.  Generalized seizure ocurred after running out of lorazepam post-tumor.    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 Treated with alendronate from 2009-2016. DEXA (03/16/2008): L Spine T -2.0. L fem neck T -0.9, R fem neck T -1.9.  DEXA (10/15/2014): L Spine T -1.3, L fem neck T -0.6, R fem neck T -1.8.  Bisphosphonate holiday 06/2015.  Repeat DEXA scan in 10/2016 to reassess.     Assessment & Plan:   See Encounters Tab for problem based charting.  Patient discussed with Dr.  Jimmye Norman

## 2021-07-27 NOTE — Assessment & Plan Note (Addendum)
Mother called the clinic after noticing changes in patient's memory .  Patient lives with parents since being diagnosed and treated for [place] suprasellar germinoma in 1997.  Has residual left upper and lower extremity weakness and cognitive changes.  Had been receiving surveillance MRIs up until about 2009.  After discussion with Dr. Eppie Gibson and family at that time was decided to hold off on imaging unless new neurologic changes were observed by family.  Mother noticed that patient had been taking his medication inappropriately about 2 months ago.  She normally sorts medications into medication boxes and he was able to administer his medications to himself daily up until 2 months ago when she noticed that he was taking extra doses or forgetting to take his medications.  States patient was unable to recall whether or not he took his medications for the day since then she has been administering his medications for him every day.  She notes that he has fallen a couple of times in the last 2 months as well she feels that this is unusual for him.  She does not think he hit his head during these falls but did note that he had some arm pain about a month ago from this which has since resolved.  She has not noticed new weakness in the patient or sensory changes although patient is usually unable to provide much insight on this.  She has not noticed any signs of seizures he is currently taking his stable dose of Ativan milligrams nightly for this.  She feels that he is otherwise behaving like his normal self and eating drinking and interacting with family at his baseline.  However she feels that his memory has gotten worse and is similar to how he presented when he was first diagnosed with a brain tumor.  Discussed with mother that repeat MRI of the head would likely be helpful to determine if there is recurrence of the tumor.  She would like patient to receive sedation prior to imaging as he would be unable to tolerate  laying still for long periods of time.  Plan MRI w wo contrast Follow up in 1-2 weeks, no appointment with Dr. Dareen Piano until November Consider testing for reversible causes of memory loss including TSH and B12 although he is on levothyroxine for hypothyroidism and B12 supplements

## 2021-07-30 ENCOUNTER — Other Ambulatory Visit: Payer: Self-pay | Admitting: Internal Medicine

## 2021-07-30 DIAGNOSIS — R252 Cramp and spasm: Secondary | ICD-10-CM

## 2021-07-31 ENCOUNTER — Other Ambulatory Visit: Payer: Self-pay | Admitting: Internal Medicine

## 2021-07-31 DIAGNOSIS — M818 Other osteoporosis without current pathological fracture: Secondary | ICD-10-CM

## 2021-07-31 DIAGNOSIS — T380X5A Adverse effect of glucocorticoids and synthetic analogues, initial encounter: Secondary | ICD-10-CM

## 2021-08-01 LAB — HEMOGLOBIN A1C: Hemoglobin-A1c: 6.2

## 2021-08-01 NOTE — Progress Notes (Signed)
Internal Medicine Clinic Attending ? ?Case discussed with Dr. Liang  At the time of the visit.  We reviewed the resident?s history and exam and pertinent patient test results.  I agree with the assessment, diagnosis, and plan of care documented in the resident?s note. ? ?

## 2021-08-10 ENCOUNTER — Other Ambulatory Visit: Payer: Self-pay | Admitting: Internal Medicine

## 2021-08-10 ENCOUNTER — Encounter: Payer: Medicare Other | Admitting: Student

## 2021-08-10 DIAGNOSIS — G40909 Epilepsy, unspecified, not intractable, without status epilepticus: Secondary | ICD-10-CM

## 2021-08-10 DIAGNOSIS — F4322 Adjustment disorder with anxiety: Secondary | ICD-10-CM

## 2021-08-11 ENCOUNTER — Other Ambulatory Visit: Payer: Self-pay | Admitting: Internal Medicine

## 2021-08-11 DIAGNOSIS — R252 Cramp and spasm: Secondary | ICD-10-CM

## 2021-08-11 DIAGNOSIS — Z23 Encounter for immunization: Secondary | ICD-10-CM | POA: Diagnosis not present

## 2021-08-15 ENCOUNTER — Encounter: Payer: Self-pay | Admitting: Internal Medicine

## 2021-08-15 ENCOUNTER — Ambulatory Visit (INDEPENDENT_AMBULATORY_CARE_PROVIDER_SITE_OTHER): Payer: Medicare Other | Admitting: Internal Medicine

## 2021-08-15 ENCOUNTER — Other Ambulatory Visit: Payer: Self-pay

## 2021-08-15 VITALS — BP 121/91 | HR 81 | Temp 97.7°F | Wt 224.4 lb

## 2021-08-15 DIAGNOSIS — Z85841 Personal history of malignant neoplasm of brain: Secondary | ICD-10-CM

## 2021-08-15 DIAGNOSIS — E230 Hypopituitarism: Secondary | ICD-10-CM | POA: Diagnosis not present

## 2021-08-15 DIAGNOSIS — Z23 Encounter for immunization: Secondary | ICD-10-CM | POA: Diagnosis not present

## 2021-08-15 DIAGNOSIS — Z0000 Encounter for general adult medical examination without abnormal findings: Secondary | ICD-10-CM | POA: Insufficient documentation

## 2021-08-15 NOTE — Assessment & Plan Note (Addendum)
Patient has been receiving yearly eye exams for diabetes insipidus, usually seen by Dr. Alois Cliche. Patient no longer following Dr. Idolina Primer and has requested opthomalogy referral.     PLAN: Although eye exams are not warranted in the setting on diabetes insipidus; patient requested opthalmology referral since he currently receives yearly exams in the past and would like to continue. Ambulatory referral ordered

## 2021-08-15 NOTE — Progress Notes (Addendum)
CC: Follow up; flu vaccine; medication refill  HPI:  [name] is a 40 y.o. male with a past medical history stated below and presents today for follow up visit, flu vaccine and medication refill. Patient is present with his father. Patient does not have any complaints at this time.   Please see problem based assessment and plan for additional details.  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    H/O malignant neoplasm of brain 09/22/2014   Suprasellar germinoma treated at [facility] in 1997; received chemotherapy (cisplatin, etoposide, vincristine, and cyclophosphomide) and radiation therapy.  Complicated by left sided motor tic disorder and panhypopituitarism.    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  Includes central diabetes insipidus, secondary hypothyroidism, secondary adrenal insufficiency, and secondary hypogonadism.    Perennial allergic rhinitis 07/09/2015   Seizure disorder (HCC) 08/08/2006   Predated brain tumor.  Generalized seizure ocurred after running out of lorazepam post-tumor.    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 Treated with alendronate from 2009-2016. DEXA (03/16/2008): L Spine T -2.0. L fem neck T -0.9, R fem neck T -1.9.  DEXA (10/15/2014): L Spine T -1.3, L fem neck T -0.6, R fem neck T -1.8.  Bisphosphonate holiday 06/2015.  Repeat DEXA scan in 10/2016 to reassess.     Current Outpatient Medications on File Prior to Visit  Medication Sig Dispense Refill    albuterol (PROAIR HFA) 108 (90 Base) MCG/ACT inhaler Inhale 1-2 puffs into the lungs every 6 (six) hours as needed. 1 Inhaler 2   alendronate (FOSAMAX) 70 MG tablet TAKE 1 TABLET(70 MG) BY MOUTH 1 TIME A WEEK 4 tablet 11   amLODipine (NORVASC) 5 MG tablet TAKE 1 TABLET(5 MG) BY MOUTH DAILY 90 tablet 0   Ascorbic Acid (VITAMIN C) 1000 MG tablet Take 1,000 mg by mouth daily.     B Complex Vitamins (VITAMIN B COMPLEX) CAPS Take 1 capsule by mouth once a day     Blood Pressure Monitor KIT Please take your blood pressure daily in the morning 1 kit 0   calcium-vitamin D (OSCAL 500/200 D-3) 500-200 MG-UNIT tablet Take 1 tablet by mouth 2 (two) times daily. 180 tablet 3   cyclobenzaprine (FLEXERIL) 10 MG tablet TAKE 1 TO 2 TABLETS BY MOUTH AT BEDTIME AS NEEDED FOR MUSCLE SPASMS 120 tablet 0   desmopressin (DDAVP NASAL) 0.01 % solution INSTILL 1 SPRAY INTO BOTH NOSTRILS FOUR TIMES DAILY 70 mL 3   fexofenadine (ALLEGRA ALLERGY) 180 MG tablet Take 1 tablet (180 mg total) by mouth daily. 30 tablet 2   fluticasone (FLONASE) 50 MCG/ACT nasal spray Place 1 spray into both nostrils daily. 48 g 0   guaiFENesin-codeine (VIRTUSSIN A/C) 100-10 MG/5ML syrup Take 5 mLs by mouth at bedtime as needed for cough. 473 mL 0   hydrocortisone (CORTEF) 10 MG tablet TAKE 1 AND 1/2 TABLET BY MOUTH DAILY; DOUBLE THE DOSE ON SICK DAYS 135 tablet 11   levothyroxine (SYNTHROID, LEVOTHROID) 112 MCG tablet Take 1 tablet (112 mcg total) by mouth daily. 90 tablet 2  LORazepam (ATIVAN) 2 MG tablet TAKE 3 TABLETS(6 MG) BY MOUTH AT BEDTIME 90 tablet 0   Multiple Vitamin (MULTIVITAMIN) tablet Take 1 tablet by mouth daily.       potassium chloride (KLOR-CON) 10 MEQ tablet TAKE 2 TABLETS(20 MEQ) BY MOUTH TWICE DAILY 360 tablet 3   Testosterone (ANDROGEL PUMP) 20.25 MG/ACT (1.62%) GEL Place 20.25 mg onto the skin daily. Apply 6 pumps 3 pumps per side) topically to shoulders or upper back once a day 75 g 3   vitamin B-12 (CYANOCOBALAMIN) 500 MCG  tablet Take 500 mcg by mouth daily.     zinc gluconate 50 MG tablet Take 50 mg by mouth daily.     No current facility-administered medications on file prior to visit.    Family History  Problem Relation Age of Onset   Hypertension Mother    Hypertension Father    Hyperlipidemia Father    Prostate cancer Father    Diabetes type I Sister        Died of complications of diabetes in her 43's    Social History   Socioeconomic History   Marital status: Single    Spouse name: Not on file   Number of children: Not on file   Years of education: Not on file   Highest education level: Not on file  Occupational History   Not on file  Tobacco Use   Smoking status: Never   Smokeless tobacco: Never  Substance and Sexual Activity   Alcohol use: No    Alcohol/week: 0.0 standard drinks   Drug use: No   Sexual activity: Not on file  Other Topics Concern   Not on file  Social History Narrative   Lives with parents.  Parents retired. Father or mother take him to appointments.  Mother sends notes if she can not take him.   Social Determinants of Health   Financial Resource Strain: Not on file  Food Insecurity: Not on file  Transportation Needs: Not on file  Physical Activity: Not on file  Stress: Not on file  Social Connections: Not on file  Intimate Partner Violence: Not on file    Review of Systems: ROS negative except for what is noted on the assessment and plan.  Vitals:   08/15/21 0836  BP: (!) 121/91  Pulse: 81  Temp: 97.7 F (36.5 C)  TempSrc: Oral  SpO2: 99%  Weight: 224 lb 6.4 oz (101.8 kg)     Physical Exam: Constitutional: well-appearing, sitting in chair, in no acute distress HENT: normocephalic atraumatic, mucous membranes moist Eyes: conjunctiva non-erythematous Neck: supple Cardiovascular: regular rate and rhythm, no m/r/g Pulmonary/Chest: normal work of breathing on room air, lungs clear to auscultation bilaterally Abdominal: soft, non-tender,  non-distended MSK: normal bulk and tone Neurological: alert & oriented x 3, 5/5 strength in bilateral upper and lower extremities, normal gait Skin: warm and dry Psych: Calm; normal mood; normal behavior   Assessment & Plan:   See Encounters Tab for problem based charting.  Patient seen with Dr. Blenda Bridegroom, M.D. [facility], PGY-1 Pager: [phone], Phone: [phone] Date 08/15/2021 Time 9:15 AM

## 2021-08-15 NOTE — Assessment & Plan Note (Addendum)
Patient denies weakness, any recent falls, memory loss since prior clinic visit 07/26/21. He feels well. Recent blood work (BMP, T4)  from [facility] in August 2022 were reassuring.Patient has no complaints and father denies any changes that he has noticed and reports patient is at baseline. Patient has scheduled MRI 08/30/21.  Plan: MRI scheduled for 08/30/21 for past complaint of weakness

## 2021-08-16 DIAGNOSIS — D509 Iron deficiency anemia, unspecified: Secondary | ICD-10-CM | POA: Diagnosis not present

## 2021-08-16 DIAGNOSIS — E669 Obesity, unspecified: Secondary | ICD-10-CM | POA: Diagnosis not present

## 2021-08-16 DIAGNOSIS — E232 Diabetes insipidus: Secondary | ICD-10-CM | POA: Diagnosis not present

## 2021-08-16 DIAGNOSIS — R7303 Prediabetes: Secondary | ICD-10-CM | POA: Diagnosis not present

## 2021-08-16 DIAGNOSIS — E230 Hypopituitarism: Secondary | ICD-10-CM | POA: Diagnosis not present

## 2021-08-16 DIAGNOSIS — Z125 Encounter for screening for malignant neoplasm of prostate: Secondary | ICD-10-CM | POA: Diagnosis not present

## 2021-08-16 NOTE — Progress Notes (Signed)
Internal Medicine Clinic Attending  I saw and evaluated the patient.  I personally confirmed the key portions of the history and exam documented by Dr. Ariwodo and I reviewed pertinent patient test results.  The assessment, diagnosis, and plan were formulated together and I agree with the documentation in the resident's note.   

## 2021-08-26 ENCOUNTER — Encounter (HOSPITAL_COMMUNITY): Payer: Self-pay

## 2021-08-26 ENCOUNTER — Other Ambulatory Visit: Payer: Self-pay

## 2021-08-26 NOTE — Progress Notes (Signed)
PCP - Dr. Dareen Piano Cardiologist - mom denies  EKG - DOS  Chest x-ray -  ECHO -  Cardiac Cath -  CPAP -   COVID TEST- n/a  Anesthesia review: n/a  -------------  SDW INSTRUCTIONS:  Your procedure is scheduled on Tuesday 10/18. Please report to [facility] Main Entrance "A" at 0530 A.M., and check in at the Admitting office. Call this number if you have problems the morning of surgery: [phone]   Remember: Do not eat or drink after midnight the night before your surgery (Monday)   Medications to take morning of surgery with a sip of water include: Inhaler --- Please bring all inhalers with you the day of surgery.  Synthroid DDAVP nasal spray   As of today, STOP taking any Aspirin (unless otherwise instructed by your surgeon), Aleve, Naproxen, Ibuprofen, Motrin, Advil, Goody's, BC's, all herbal medications, fish oil, and all vitamins.    The Morning of Surgery Do not wear jewelry Do not wear lotions, powders, colognes, or deodorant Do not shave 48 hours prior to surgery.   Men may shave face and neck. Do not bring valuables to the hospital. [facility] is not responsible for any belongings or valuables.  If you are a smoker, DO NOT Smoke 24 hours prior to surgery  If you wear a CPAP at night please bring your mask the morning of surgery   Remember that you must have someone to transport you home after your surgery, and remain with you for 24 hours if you are discharged the same day.  Please bring cases for contacts, glasses, hearing aids, dentures or bridgework because it cannot be worn into surgery.   Patients discharged the day of surgery will not be allowed to drive home.   Please shower the NIGHT BEFORE/MORNING OF SURGERY (use antibacterial soap like DIAL soap if possible). Wear comfortable clothes the morning of surgery. Oral Hygiene is also important to reduce your risk of infection.  Remember - BRUSH YOUR TEETH THE MORNING OF SURGERY WITH YOUR REGULAR  TOOTHPASTE  Patient denies shortness of breath, fever, cough and chest pain.

## 2021-08-28 ENCOUNTER — Other Ambulatory Visit: Payer: Self-pay | Admitting: Internal Medicine

## 2021-08-28 DIAGNOSIS — I10 Essential (primary) hypertension: Secondary | ICD-10-CM

## 2021-08-29 NOTE — Progress Notes (Signed)
Anesthesia Chart Review: Nicholas Holt  Case: [id number] Date/Time: 08/30/21 0745   Procedure: MRI WITH ANESTHESIA BRAIN WITH AND WITHOUT CONTRAST   Anesthesia type: General   Pre-op diagnosis: MEMORY LOSS, HISTORY OF MALIGNANT NEOPLASM OF BRAIN   Location: [facility] OR RADIOLOGY ROOM / [place] OR   Surgeons: Radiologist, Medication, MD       DISCUSSION: Patient is a 40 year old male scheduled for the above procedure. MRI was ordered by [name], MD. H&P form completed om 08/11/21 (under Media tab, Radiology Order), but also has updated visit on 08/15/21 with Dr. Timothy Lasso, MD ([place]).  MRI was ordered to evaluate for worsening memory issues, as his mother felt symptoms were similar to when he was diagnosed with a brain mass in 1997.   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  He is on DDAVP, Cortef, levothyroxine, and testosterone gel for pancytopenia.   Anesthesia team to evaluate on the day of procedure.    VS: Ht _0  (1.702 m)   Wt 99.8 kg   BMI 34.46 kg/m  BP Readings from Last 3 Encounters:  08/15/21 (!) 121/91  02/15/21 120/81  01/25/21 (!) 141/100   Pulse Readings from Last 3 Encounters:  08/15/21 81  02/15/21 84  01/25/21 86     PROVIDERS: Aldine Contes, MD is PCP  Jannifer Hick, MD is nephrologist   LABS: For day of procedure as indicated.  As of  07/12/21, A1c 6.2 and Creatinine 1.36 on 01/11/21.    IMAGES: US Renal 01/26/21: IMPRESSION: - No acute finding or hydronephrosis. - Suspected complex possibly calcified left renal cyst measuring 2.6 cm.   EKG: Day of procedure as indicated.    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    H/O malignant neoplasm of brain 09/22/2014   Suprasellar germinoma treated at [facility] in 1997; received chemotherapy (cisplatin, etoposide, vincristine, and cyclophosphomide) and radiation therapy.  Complicated by left sided motor tic disorder and panhypopituitarism.    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  Includes central diabetes insipidus, secondary hypothyroidism, secondary adrenal insufficiency, and secondary hypogonadism.    Perennial allergic rhinitis 07/09/2015   Seizure disorder ([facility]) 08/08/2006   Predated brain tumor.  Generalized seizure ocurred after running out of lorazepam post-tumor.    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 Treated with alendronate from 2009-2016. DEXA (03/16/2008): L Spine T -2.0. L fem neck T -0.9, R fem neck T -1.9.  DEXA (10/15/2014): L Spine T -1.3, L fem neck T -0.6, R fem neck T -1.8.  Bisphosphonate holiday 06/2015.  Repeat DEXA scan in 10/2016 to reassess.     Past Surgical History:  Procedure Laterality Date   BRAIN BIOPSY  1997   CSF SHUNT  1997   PORTACATH PLACEMENT      MEDICATIONS: No current facility-administered medications for this encounter.  albuterol (PROAIR HFA) 108 (90 Base) MCG/ACT inhaler   alendronate (FOSAMAX) 70 MG tablet   Ascorbic Acid (VITAMIN C) 1000 MG tablet   cyclobenzaprine (FLEXERIL) 10 MG tablet   desmopressin (DDAVP NASAL) 0.01 % solution   fluticasone (FLONASE) 50 MCG/ACT nasal spray   hydrocortisone (CORTEF) 10 MG tablet   levothyroxine (SYNTHROID) 100 MCG  tablet   LORazepam (ATIVAN) 2 MG tablet   potassium chloride (KLOR-CON) 10 MEQ tablet   Testosterone (ANDROGEL PUMP) 20.25 MG/ACT (1.62%) GEL   amLODipine (NORVASC) 5 MG tablet   Blood Pressure Monitor KIT    Myra Gianotti, PA-C Surgical Short Stay/Anesthesiology [facility] Phone [phone] [facility] Phone [phone] 08/29/2021 12:35 PM

## 2021-08-29 NOTE — H&P (View-Only) (Signed)
Anesthesia Chart Review: Nicholas Holt  Case: [id number] Date/Time: 08/30/21 0745   Procedure: MRI WITH ANESTHESIA BRAIN WITH AND WITHOUT CONTRAST   Anesthesia type: General   Pre-op diagnosis: MEMORY LOSS, HISTORY OF MALIGNANT NEOPLASM OF BRAIN   Location: [facility] OR RADIOLOGY ROOM / [place] OR   Surgeons: Radiologist, Medication, MD       DISCUSSION: Patient is a 40 year old male scheduled for the above procedure. MRI was ordered by [name], MD. H&P form completed om 08/11/21 (under Media tab, Radiology Order), but also has updated visit on 08/15/21 with Dr. Timothy Lasso, MD ([place]).  MRI was ordered to evaluate for worsening memory issues, as his mother felt symptoms were similar to when he was diagnosed with a brain mass in 1997.   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  He is on DDAVP, Cortef, levothyroxine, and testosterone gel for pancytopenia.   Anesthesia team to evaluate on the day of procedure.    VS: Ht _0  (1.702 m)   Wt 99.8 kg   BMI 34.46 kg/m  BP Readings from Last 3 Encounters:  08/15/21 (!) 121/91  02/15/21 120/81  01/25/21 (!) 141/100   Pulse Readings from Last 3 Encounters:  08/15/21 81  02/15/21 84  01/25/21 86     PROVIDERS: Aldine Contes, MD is PCP  Jannifer Hick, MD is nephrologist   LABS: For day of procedure as indicated.  As of  07/12/21, A1c 6.2 and Creatinine 1.36 on 01/11/21.    IMAGES: US Renal 01/26/21: IMPRESSION: - No acute finding or hydronephrosis. - Suspected complex possibly calcified left renal cyst measuring 2.6 cm.   EKG: Day of procedure as indicated.    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    H/O malignant neoplasm of brain 09/22/2014   Suprasellar germinoma treated at [facility] in 1997; received chemotherapy (cisplatin, etoposide, vincristine, and cyclophosphomide) and radiation therapy.  Complicated by left sided motor tic disorder and panhypopituitarism.    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  Includes central diabetes insipidus, secondary hypothyroidism, secondary adrenal insufficiency, and secondary hypogonadism.    Perennial allergic rhinitis 07/09/2015   Seizure disorder ([facility]) 08/08/2006   Predated brain tumor.  Generalized seizure ocurred after running out of lorazepam post-tumor.    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 Treated with alendronate from 2009-2016. DEXA (03/16/2008): L Spine T -2.0. L fem neck T -0.9, R fem neck T -1.9.  DEXA (10/15/2014): L Spine T -1.3, L fem neck T -0.6, R fem neck T -1.8.  Bisphosphonate holiday 06/2015.  Repeat DEXA scan in 10/2016 to reassess.     Past Surgical History:  Procedure Laterality Date   BRAIN BIOPSY  1997   CSF SHUNT  1997   PORTACATH PLACEMENT      MEDICATIONS: No current facility-administered medications for this encounter.  albuterol (PROAIR HFA) 108 (90 Base) MCG/ACT inhaler   alendronate (FOSAMAX) 70 MG tablet   Ascorbic Acid (VITAMIN C) 1000 MG tablet   cyclobenzaprine (FLEXERIL) 10 MG tablet   desmopressin (DDAVP NASAL) 0.01 % solution   fluticasone (FLONASE) 50 MCG/ACT nasal spray   hydrocortisone (CORTEF) 10 MG tablet   levothyroxine (SYNTHROID) 100 MCG  tablet   LORazepam (ATIVAN) 2 MG tablet   potassium chloride (KLOR-CON) 10 MEQ tablet   Testosterone (ANDROGEL PUMP) 20.25 MG/ACT (1.62%) GEL   amLODipine (NORVASC) 5 MG tablet   Blood Pressure Monitor KIT    Myra Gianotti, PA-C Surgical Short Stay/Anesthesiology [facility] Phone [phone] [facility] Phone [phone] 08/29/2021 12:35 PM

## 2021-08-29 NOTE — Anesthesia Preprocedure Evaluation (Addendum)
Anesthesia Evaluation  Patient identified by MRN, date of birth, ID band Patient awake    Reviewed: Allergy & Precautions, NPO status , Patient's Chart, lab work & pertinent test results  Airway Mallampati: II  TM Distance: >3 FB Neck ROM: Full    Dental  (+) Teeth Intact, Dental Advisory Given   Pulmonary neg pulmonary ROS,    breath sounds clear to auscultation       Cardiovascular hypertension,  Rhythm:Regular Rate:Normal     Neuro/Psych Seizures -,  PSYCHIATRIC DISORDERS Anxiety    GI/Hepatic negative GI ROS, Neg liver ROS,   Endo/Other  negative endocrine ROS  Renal/GU Renal disease     Musculoskeletal negative musculoskeletal ROS (+)   Abdominal Normal abdominal exam  (+) scaphoid   Peds  Hematology negative hematology ROS (+)   Anesthesia Other Findings   Reproductive/Obstetrics                           Anesthesia Physical Anesthesia Plan  ASA: 3  Anesthesia Plan: General   Post-op Pain Management:    Induction: Intravenous  PONV Risk Score and Plan: 2 and Ondansetron and Dexamethasone  Airway Management Planned: Oral ETT  Additional Equipment: None  Intra-op Plan:   Post-operative Plan: Extubation in OR  Informed Consent: I have reviewed the patients History and Physical, chart, labs and discussed the procedure including the risks, benefits and alternatives for the proposed anesthesia with the patient or authorized representative who has indicated his/her understanding and acceptance.     Dental advisory given  Plan Discussed with: CRNA  Anesthesia Plan Comments: (PAT note written 08/29/2021 by Myra Gianotti, PA-C. )      Anesthesia Quick Evaluation

## 2021-08-30 ENCOUNTER — Other Ambulatory Visit: Payer: Self-pay

## 2021-08-30 ENCOUNTER — Encounter (HOSPITAL_COMMUNITY): Admission: RE | Disposition: A | Discharge status: Home / Self Care | Payer: Self-pay | Source: Home / Self Care

## 2021-08-30 ENCOUNTER — Encounter (HOSPITAL_COMMUNITY): Payer: Self-pay

## 2021-08-30 ENCOUNTER — Ambulatory Visit (HOSPITAL_COMMUNITY): Payer: Medicare Other | Admitting: Physician Assistant

## 2021-08-30 ENCOUNTER — Ambulatory Visit (HOSPITAL_COMMUNITY)
Admission: RE | Admit: 2021-08-30 | Discharge: 2021-08-30 | Disposition: A | Discharge status: Home / Self Care | Payer: Medicare Other | Attending: Diagnostic Radiology | Admitting: Diagnostic Radiology

## 2021-08-30 ENCOUNTER — Ambulatory Visit (HOSPITAL_COMMUNITY)
Admission: RE | Admit: 2021-08-30 | Discharge: 2021-08-30 | Disposition: A | Discharge status: Home / Self Care | Payer: Medicare Other | Source: Ambulatory Visit | Attending: Internal Medicine | Admitting: Internal Medicine

## 2021-08-30 DIAGNOSIS — D61818 Other pancytopenia: Secondary | ICD-10-CM | POA: Diagnosis not present

## 2021-08-30 DIAGNOSIS — Z79890 Hormone replacement therapy (postmenopausal): Secondary | ICD-10-CM | POA: Insufficient documentation

## 2021-08-30 DIAGNOSIS — Z85841 Personal history of malignant neoplasm of brain: Secondary | ICD-10-CM | POA: Diagnosis not present

## 2021-08-30 DIAGNOSIS — Z923 Personal history of irradiation: Secondary | ICD-10-CM | POA: Insufficient documentation

## 2021-08-30 DIAGNOSIS — Z9221 Personal history of antineoplastic chemotherapy: Secondary | ICD-10-CM | POA: Diagnosis not present

## 2021-08-30 DIAGNOSIS — Z7983 Long term (current) use of bisphosphonates: Secondary | ICD-10-CM | POA: Insufficient documentation

## 2021-08-30 DIAGNOSIS — R413 Other amnesia: Secondary | ICD-10-CM | POA: Diagnosis not present

## 2021-08-30 DIAGNOSIS — N179 Acute kidney failure, unspecified: Secondary | ICD-10-CM | POA: Diagnosis not present

## 2021-08-30 DIAGNOSIS — I10 Essential (primary) hypertension: Secondary | ICD-10-CM | POA: Diagnosis not present

## 2021-08-30 DIAGNOSIS — Z79899 Other long term (current) drug therapy: Secondary | ICD-10-CM | POA: Diagnosis not present

## 2021-08-30 DIAGNOSIS — I6789 Other cerebrovascular disease: Secondary | ICD-10-CM | POA: Diagnosis not present

## 2021-08-30 DIAGNOSIS — Z982 Presence of cerebrospinal fluid drainage device: Secondary | ICD-10-CM | POA: Diagnosis not present

## 2021-08-30 HISTORY — PX: RADIOLOGY WITH ANESTHESIA: SHX6223

## 2021-08-30 LAB — BASIC METABOLIC PANEL
Anion gap: 6 (ref 5–15)
BUN: 10 mg/dL (ref 6–20)
CO2: 27 mmol/L (ref 22–32)
Calcium: 9 mg/dL (ref 8.9–10.3)
Chloride: 106 mmol/L (ref 98–111)
Creatinine, Ser: 1.17 mg/dL (ref 0.61–1.24)
GFR, Estimated: >60 mL/min (ref >60)
Glucose, Bld: 92 mg/dL (ref 70–99)
Potassium: 3.5 mmol/L (ref 3.5–5.1)
Sodium: 139 mmol/L (ref 135–145)

## 2021-08-30 SURGERY — MRI WITH ANESTHESIA
Anesthesia: General

## 2021-08-30 MED ORDER — ONDANSETRON HCL 4 MG/2ML IJ SOLN: 4.0000 mg | Freq: Once | INTRAMUSCULAR | Status: DC | PRN

## 2021-08-30 MED ORDER — FENTANYL CITRATE (PF) 100 MCG/2ML IJ SOLN
INTRAMUSCULAR | Status: DC | PRN
  Administered 2021-08-30: 25 ug via INTRAVENOUS
  Administered 2021-08-30: 50 ug via INTRAVENOUS

## 2021-08-30 MED ORDER — LACTATED RINGERS IV SOLN: INTRAVENOUS | Status: DC

## 2021-08-30 MED ORDER — CHLORHEXIDINE GLUCONATE 0.12 % MT SOLN: 15.0000 mL | Freq: Once | OROMUCOSAL | Status: AC

## 2021-08-30 MED ORDER — AMISULPRIDE (ANTIEMETIC) 5 MG/2ML IV SOLN: 10.0000 mg | Freq: Once | INTRAVENOUS | Status: DC | PRN

## 2021-08-30 MED ORDER — ONDANSETRON HCL 4 MG/2ML IJ SOLN
INTRAMUSCULAR | Status: DC | PRN
  Administered 2021-08-30: 4 mg via INTRAVENOUS

## 2021-08-30 MED ORDER — DEXAMETHASONE SODIUM PHOSPHATE 10 MG/ML IJ SOLN
INTRAMUSCULAR | Status: DC | PRN
Start: 2021-08-30 — End: 2021-08-30
  Administered 2021-08-30: 10 mg via INTRAVENOUS

## 2021-08-30 MED ORDER — ORAL CARE MOUTH RINSE
15.0000 mL | Freq: Once | OROMUCOSAL | Status: AC
  Administered 2021-08-30: 15 mL via OROMUCOSAL

## 2021-08-30 MED ORDER — SUGAMMADEX SODIUM 200 MG/2ML IV SOLN
INTRAVENOUS | Status: DC | PRN
  Administered 2021-08-30: 200 mg via INTRAVENOUS

## 2021-08-30 MED ORDER — GADOBUTROL 1 MMOL/ML IV SOLN
10.0000 mL | Freq: Once | INTRAVENOUS | Status: AC | PRN
  Administered 2021-08-30: 10 mL via INTRAVENOUS

## 2021-08-30 MED ORDER — LIDOCAINE 2% (20 MG/ML) 5 ML SYRINGE
INTRAMUSCULAR | Status: DC | PRN
  Administered 2021-08-30: 40 mg via INTRAVENOUS

## 2021-08-30 MED ORDER — PROPOFOL 10 MG/ML IV BOLUS
INTRAVENOUS | Status: DC | PRN
  Administered 2021-08-30: 140 mg via INTRAVENOUS
  Administered 2021-08-30: 30 mg via INTRAVENOUS

## 2021-08-30 MED ORDER — ROCURONIUM BROMIDE 10 MG/ML (PF) SYRINGE
PREFILLED_SYRINGE | INTRAVENOUS | Status: DC | PRN
  Administered 2021-08-30: 50 mg via INTRAVENOUS

## 2021-08-30 NOTE — Anesthesia Procedure Notes (Signed)
Procedure Name: Intubation Date/Time: 08/30/2021 8:11 AM Performed by: Ardyth Harps, CRNA Pre-anesthesia Checklist: Patient identified, Emergency Drugs available, Suction available and Patient being monitored Patient Re-evaluated:Patient Re-evaluated prior to induction Oxygen Delivery Method: Circle System Utilized Preoxygenation: Pre-oxygenation with 100% oxygen Induction Type: IV induction Ventilation: Mask ventilation without difficulty Laryngoscope Size: Mac and 4 Grade View: Grade II Tube type: Oral Tube size: 7.5 mm Number of attempts: 1 Airway Equipment and Method: Stylet Placement Confirmation: ETT inserted through vocal cords under direct vision, positive ETCO2 and breath sounds checked- equal and bilateral Secured at: 23 cm Tube secured with: Tape Dental Injury: Teeth and Oropharynx as per pre-operative assessment

## 2021-08-30 NOTE — Anesthesia Postprocedure Evaluation (Signed)
Anesthesia Post Note  Patient: Nicholas Holt  Procedure(s) Performed: MRI WITH ANESTHESIA BRAIN WITH AND WITHOUT CONTRAST     Patient location during evaluation: PACU Anesthesia Type: General Level of consciousness: awake and alert Pain management: pain level controlled Vital Signs Assessment: post-procedure vital signs reviewed and stable Respiratory status: spontaneous breathing, nonlabored ventilation, respiratory function stable and patient connected to nasal cannula oxygen Cardiovascular status: blood pressure returned to baseline and stable Postop Assessment: no apparent nausea or vomiting Anesthetic complications: no   No notable events documented.  Last Vitals:  Vitals:   08/30/21 0940 08/30/21 0954  BP: 118/77 125/74  Pulse: 72 73  Resp: 19 18  Temp:  (!) 36.3 C  SpO2: 100% 100%    Last Pain:  Vitals:   08/30/21 0954  TempSrc:   PainSc: 0-No pain                 Effie Berkshire

## 2021-08-30 NOTE — Interval H&P Note (Signed)
Anesthesia H&P Update: History and Physical Exam reviewed; patient is OK for planned anesthetic and procedure. ? ?

## 2021-08-30 NOTE — Transfer of Care (Signed)
Immediate Anesthesia Transfer of Care Note  Patient: Nicholas Holt  Procedure(s) Performed: MRI WITH ANESTHESIA BRAIN WITH AND WITHOUT CONTRAST  Patient Location: PACU  Anesthesia Type:General  Level of Consciousness: drowsy  Airway & Oxygen Therapy: Patient Spontanous Breathing and Patient connected to face mask oxygen  Post-op Assessment: Report given to RN and Post -op Vital signs reviewed and stable  Post vital signs: Reviewed and stable  Last Vitals:  Vitals Value Taken Time  BP 118/73 08/30/21 0925  Temp 36.2 C 08/30/21 0925  Pulse 75 08/30/21 0933  Resp 19 08/30/21 0933  SpO2 100 % 08/30/21 0933  Vitals shown include unvalidated device data.  Last Pain:  Vitals:   08/30/21 0925  TempSrc:   PainSc: Asleep         Complications: No notable events documented.

## 2021-08-31 ENCOUNTER — Encounter (HOSPITAL_COMMUNITY): Payer: Self-pay | Admitting: Radiology

## 2021-09-05 ENCOUNTER — Telehealth: Payer: Self-pay

## 2021-09-05 NOTE — Telephone Encounter (Signed)
Received TC from patient's mother, (EC) [employer], who states patient was referred to [facility], Dr. Gershon Crane.  States she hasn't heard from them so she called Dr. Kellie Moor office and spoke with someone who told her they have not received anything from [facility].  Mother states whoever she spoke to requested [facility] call Dr.Shapiro's office regarding referral and not fax. RN informed mother that referral was placed and notes were faxed per chart review.  Will forward her message to [facility] referral coordinator to assist patient's EC Thank you, SChaplin, RN,BSN .

## 2021-09-14 ENCOUNTER — Other Ambulatory Visit: Payer: Self-pay | Admitting: Internal Medicine

## 2021-09-14 DIAGNOSIS — F4322 Adjustment disorder with anxiety: Secondary | ICD-10-CM

## 2021-09-14 DIAGNOSIS — G40909 Epilepsy, unspecified, not intractable, without status epilepticus: Secondary | ICD-10-CM

## 2021-09-16 ENCOUNTER — Other Ambulatory Visit: Payer: Self-pay | Admitting: Internal Medicine

## 2021-09-16 DIAGNOSIS — E230 Hypopituitarism: Secondary | ICD-10-CM

## 2021-09-19 ENCOUNTER — Other Ambulatory Visit: Payer: Self-pay

## 2021-09-19 DIAGNOSIS — G40909 Epilepsy, unspecified, not intractable, without status epilepticus: Secondary | ICD-10-CM

## 2021-09-19 DIAGNOSIS — F4322 Adjustment disorder with anxiety: Secondary | ICD-10-CM

## 2021-09-20 MED ORDER — LORAZEPAM 2 MG PO TABS: ORAL_TABLET | ORAL | 0 refills | Status: DC

## 2021-10-26 ENCOUNTER — Other Ambulatory Visit: Payer: Self-pay | Admitting: Internal Medicine

## 2021-10-26 DIAGNOSIS — F4322 Adjustment disorder with anxiety: Secondary | ICD-10-CM

## 2021-10-26 DIAGNOSIS — G40909 Epilepsy, unspecified, not intractable, without status epilepticus: Secondary | ICD-10-CM

## 2021-10-27 ENCOUNTER — Telehealth: Payer: Self-pay | Admitting: *Deleted

## 2021-10-27 NOTE — Telephone Encounter (Signed)
Call from patient's mom to reschedule missed appointment for 10/11/2021 .  Patient was not on schedule for an appointment that day.  Mom thinks it was perhaps with her doctor.  Mom asked about the refill for the Lorazepam for patient.  Refill was done and sent to the Pharmacy today.

## 2021-11-23 DIAGNOSIS — R7303 Prediabetes: Secondary | ICD-10-CM | POA: Diagnosis not present

## 2021-11-23 DIAGNOSIS — D649 Anemia, unspecified: Secondary | ICD-10-CM | POA: Diagnosis not present

## 2021-11-23 DIAGNOSIS — E230 Hypopituitarism: Secondary | ICD-10-CM | POA: Diagnosis not present

## 2021-11-23 DIAGNOSIS — E232 Diabetes insipidus: Secondary | ICD-10-CM | POA: Diagnosis not present

## 2021-11-23 DIAGNOSIS — Z125 Encounter for screening for malignant neoplasm of prostate: Secondary | ICD-10-CM | POA: Diagnosis not present

## 2021-11-23 DIAGNOSIS — E669 Obesity, unspecified: Secondary | ICD-10-CM | POA: Diagnosis not present

## 2021-12-08 DIAGNOSIS — E232 Diabetes insipidus: Secondary | ICD-10-CM | POA: Diagnosis not present

## 2021-12-15 ENCOUNTER — Other Ambulatory Visit: Payer: Self-pay | Admitting: Internal Medicine

## 2021-12-15 DIAGNOSIS — G40909 Epilepsy, unspecified, not intractable, without status epilepticus: Secondary | ICD-10-CM

## 2021-12-15 DIAGNOSIS — F4322 Adjustment disorder with anxiety: Secondary | ICD-10-CM

## 2021-12-23 ENCOUNTER — Other Ambulatory Visit: Payer: Self-pay | Admitting: Internal Medicine

## 2021-12-23 DIAGNOSIS — E230 Hypopituitarism: Secondary | ICD-10-CM

## 2021-12-25 ENCOUNTER — Other Ambulatory Visit: Payer: Self-pay | Admitting: Internal Medicine

## 2021-12-25 DIAGNOSIS — R252 Cramp and spasm: Secondary | ICD-10-CM

## 2022-01-13 DIAGNOSIS — M810 Age-related osteoporosis without current pathological fracture: Secondary | ICD-10-CM | POA: Diagnosis not present

## 2022-01-13 DIAGNOSIS — N1831 Chronic kidney disease, stage 3a: Secondary | ICD-10-CM | POA: Diagnosis not present

## 2022-01-13 DIAGNOSIS — E232 Diabetes insipidus: Secondary | ICD-10-CM | POA: Diagnosis not present

## 2022-01-13 DIAGNOSIS — E039 Hypothyroidism, unspecified: Secondary | ICD-10-CM | POA: Diagnosis not present

## 2022-01-13 DIAGNOSIS — N281 Cyst of kidney, acquired: Secondary | ICD-10-CM | POA: Diagnosis not present

## 2022-01-19 ENCOUNTER — Other Ambulatory Visit: Payer: Self-pay | Admitting: Internal Medicine

## 2022-01-19 DIAGNOSIS — F4322 Adjustment disorder with anxiety: Secondary | ICD-10-CM

## 2022-01-19 DIAGNOSIS — M818 Other osteoporosis without current pathological fracture: Secondary | ICD-10-CM

## 2022-01-19 DIAGNOSIS — G40909 Epilepsy, unspecified, not intractable, without status epilepticus: Secondary | ICD-10-CM

## 2022-01-24 DIAGNOSIS — N281 Cyst of kidney, acquired: Secondary | ICD-10-CM | POA: Diagnosis not present

## 2022-01-24 DIAGNOSIS — H52203 Unspecified astigmatism, bilateral: Secondary | ICD-10-CM | POA: Diagnosis not present

## 2022-03-06 ENCOUNTER — Other Ambulatory Visit: Payer: Self-pay | Admitting: Internal Medicine

## 2022-03-06 DIAGNOSIS — G40909 Epilepsy, unspecified, not intractable, without status epilepticus: Secondary | ICD-10-CM

## 2022-03-06 DIAGNOSIS — F4322 Adjustment disorder with anxiety: Secondary | ICD-10-CM

## 2022-03-07 ENCOUNTER — Other Ambulatory Visit: Payer: Self-pay

## 2022-03-07 DIAGNOSIS — R252 Cramp and spasm: Secondary | ICD-10-CM

## 2022-03-07 MED ORDER — CYCLOBENZAPRINE HCL 10 MG PO TABS: ORAL_TABLET | ORAL | 0 refills | Status: DC

## 2022-03-17 ENCOUNTER — Encounter: Payer: Self-pay | Admitting: Student in an Organized Health Care Education/Training Program

## 2022-03-17 ENCOUNTER — Ambulatory Visit (INDEPENDENT_AMBULATORY_CARE_PROVIDER_SITE_OTHER): Payer: Medicare Other | Admitting: Student in an Organized Health Care Education/Training Program

## 2022-03-17 DIAGNOSIS — Z0000 Encounter for general adult medical examination without abnormal findings: Secondary | ICD-10-CM | POA: Diagnosis not present

## 2022-03-17 NOTE — Progress Notes (Signed)
? ?Subjective:  ? Nicholas Holt is a 41 y.o. male who presents for an Initial Medicare Annual Wellness Visit. ?I connected with  Nicholas Holt on 03/17/22 by a audio enabled telemedicine application and verified that I am speaking with the correct person using two identifiers. ? ?Patient Location: Home ? ?Provider Location: Office/Clinic ? ?I discussed the limitations of evaluation and management by telemedicine. The patient expressed understanding and agreed to proceed.  ? ?Review of Systems    ?Deferred to PCP  ?  ? ?   ?Objective:  ?  ?There were no vitals filed for this visit. ?There is no height or weight on file to calculate BMI. ? ? ?  08/15/2021  ?  8:37 AM 02/15/2021  ?  9:26 AM 01/11/2021  ?  8:59 AM 10/05/2020  ?  8:34 AM 03/09/2020  ?  8:26 AM 08/19/2019  ? 10:50 AM 02/06/2018  ?  3:16 PM  ?Advanced Directives  ?Does Patient Have a Medical Advance Directive? Yes _0  No  ?Type of Paramedic of [place];Living will        ?Copy of [place] in Chart? No - copy requested        ?Would patient like information on creating a medical advance directive? No - Patient declined No - Patient declined No - Patient declined No - Patient declined No - Patient declined No - Patient declined No - Patient declined  ? ? ?Current Medications (verified) ?Outpatient Encounter Medications as of 03/17/2022  ?Medication Sig  ? albuterol (PROAIR HFA) 108 (90 Base) MCG/ACT inhaler Inhale 1-2 puffs into the lungs every 6 (six) hours as needed.  ? alendronate (FOSAMAX) 70 MG tablet TAKE 1 TABLET BY MOUTH ONCE A WEEK, AS DIRECTED  ? amLODipine (NORVASC) 5 MG tablet TAKE 1 TABLET(5 MG) BY MOUTH DAILY  ? Ascorbic Acid (VITAMIN C) 1000 MG tablet Take 1,000 mg by mouth daily.  ? Blood Pressure Monitor KIT Please take your blood pressure daily in the morning  ? cyclobenzaprine (FLEXERIL) 10 MG tablet TAKE 1 TO 2 TABLETS BY MOUTH AT BEDTIME AS NEEDED FOR MUSCLE SPASMS  ? desmopressin  (DDAVP NASAL) 0.01 % solution USE ONE SPRAY IN EACH NOSTRIL FOUR TIMES DAILY  ? fluticasone (FLONASE) 50 MCG/ACT nasal spray Place 1 spray into both nostrils daily. (Patient taking differently: Place 1 spray into both nostrils at bedtime as needed for allergies.)  ? hydrocortisone (CORTEF) 10 MG tablet TAKE 1 AND 1/2 TABLET BY MOUTH DAILY; DOUBLE THE DOSE ON SICK DAYS (Patient taking differently: Take 5-10 mg by mouth See admin instructions. Take 5 mg daily, double dose on sick days)  ? levothyroxine (SYNTHROID) 100 MCG tablet Take 100 mcg by mouth daily.  ? LORazepam (ATIVAN) 2 MG tablet TAKE 3 TABLETS(6 MG) BY MOUTH AT BEDTIME  ? potassium chloride (KLOR-CON) 10 MEQ tablet TAKE 2 TABLETS(20 MEQ) BY MOUTH TWICE DAILY  ? Testosterone (ANDROGEL PUMP) 20.25 MG/ACT (1.62%) GEL Place 20.25 mg onto the skin daily. Apply 6 pumps 3 pumps per side) topically to shoulders or upper back once a day (Patient taking differently: Place 6 Pump onto the skin at bedtime. Apply 6 pumps 3 pumps per side) topically to shoulders or upper back once a day)  ? ?No facility-administered encounter medications on file as of 03/17/2022.  ? ? ?Allergies (verified) ?Patient has no known allergies.  ? ?History: ?Past Medical History:  ?Diagnosis Date  ? Anxiety   ?  Attention deficit disorder 08/08/2006  ? Bilateral leg cramps 11/25/2006  ? Blood transfusion without reported diagnosis 1997  ? Chemotherapy associated  ? Cough 12/08/2015  ? Elevated hemidiaphragm 07/09/2015  ? Left, presumably after portacath placement.   ? H/O malignant neoplasm of brain 09/22/2014  ? Suprasellar germinoma treated at [facility] in 1997; received chemotherapy (cisplatin, etoposide, vincristine, and cyclophosphomide) and radiation therapy.  Complicated by left sided motor tic disorder and panhypopituitarism.   ? Hyperlipidemia 08/08/2006  ? Hypertension   ? Night sweats 02/06/2018  ? Obesity (BMI 30.0-34.9) 07/09/2015  ? Panhypopituitarism (diabetes insipidus/anterior  pituitary deficiency) ([place]) 08/08/2006  ? Following therapy for the suprasellar germinoma.  Includes central diabetes insipidus, secondary hypothyroidism, secondary adrenal insufficiency, and secondary hypogonadism.   ? Perennial allergic rhinitis 07/09/2015  ? Seizure disorder ([place]) 08/08/2006  ? Predated brain tumor.  Generalized seizure ocurred after running out of lorazepam post-tumor.   ? 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 May also be secondary to hypogonadism. Treated with alendronate from 2009-2016. DEXA (03/16/2008): L Spine T -2.0. L fem neck T -0.9, R fem neck T -1.9.  DEXA (10/15/2014): L Spine T -1.3, L fem neck T -0.6, R fem neck T -1.8.  Bisphosphonate holiday 06/2015.  Repeat DEXA scan in 10/2016 to reassess.   ? ?Past Surgical History:  ?Procedure Laterality Date  ? BRAIN BIOPSY  1997  ? CSF SHUNT  1997  ? PORTACATH PLACEMENT    ? RADIOLOGY WITH ANESTHESIA N/A 08/30/2021  ? Procedure: MRI WITH ANESTHESIA BRAIN WITH AND WITHOUT CONTRAST;  Surgeon: Radiologist, Medication, MD;  Location: [place];  Service: Radiology;  Laterality: N/A;  ? ?Family History  ?Problem Relation Age of Onset  ? Hypertension Mother   ? Hypertension Father   ? Hyperlipidemia Father   ? Prostate cancer Father   ? Diabetes type I Sister   ?     Died of complications of diabetes in her 78's  ? ?Social History  ? ?Socioeconomic History  ? Marital status: Single  ?  Spouse name: Not on file  ? Number of children: Not on file  ? Years of education: Not on file  ? Highest education level: Not on file  ?Occupational History  ? Not on file  ?Tobacco Use  ? Smoking status: Never  ? Smokeless tobacco: Never  ?Vaping Use  ? Vaping Use: Never used  ?Substance and Sexual Activity  ? Alcohol use: No  ?  Alcohol/week: 0.0 standard drinks  ? Drug use: No  ? Sexual activity: Not on file  ?Other Topics Concern  ? Not on file  ?Social History Narrative  ? Lives with parents.  Parents retired. Father or mother take him to appointments.  Mother  sends notes if she can not take him.  ? ?Social Determinants of Health  ? ?Financial Resource Strain: Not on file  ?Food Insecurity: Not on file  ?Transportation Needs: Not on file  ?Physical Activity: Not on file  ?Stress: Not on file  ?Social Connections: Not on file  ? ? ?Tobacco Counseling ?Counseling given: Not Answered ? ? ?Clinical Intake: ? ?  ? ?  ? ?  ? ?  ? ?  ? ?Diabetic? NO  ? ?  ? ?  ? ? ?Activities of Daily Living ? ?  08/30/2021  ?  5:54 AM 08/30/2021  ?  5:52 AM  ?In your present state of health, do you have any difficulty performing the following activities:  ?Hearing?  0  ?Vision?  0  ?Difficulty concentrating or making decisions?  0  ?Walking or climbing stairs?  1  ?Dressing or bathing?  0  ?Doing errands, shopping? 1   ? ? ?Patient Care Team: ?Aldine Contes, MD as PCP - General (Internal Medicine) ? ?Indicate any recent Medical Services you may have received from other than [facility] providers in the past year (date may be approximate). ? ?   ?Assessment:  ? This is a routine wellness examination for [place]. ? ?Hearing/Vision screen ?No results found. ? ?Dietary issues and exercise activities discussed: ?  ? ? Goals Addressed   ?None ?  ?Depression Screen ? ?  08/15/2021  ?  8:39 AM 02/15/2021  ?  9:27 AM 01/25/2021  ? 10:22 AM 01/11/2021  ?  8:59 AM 10/05/2020  ?  8:35 AM 03/09/2020  ?  8:28 AM 12/09/2019  ? 12:13 PM  ?PHQ 2/9 Scores  ?PHQ - 2 Score 0 0 3 0 0 0 0  ?PHQ- 9 Score  0 3 0 0  0  ?  ?Fall Risk ? ?  02/15/2021  ?  9:26 AM 01/25/2021  ? 10:00 AM 01/11/2021  ?  8:58 AM 10/05/2020  ?  8:35 AM 03/09/2020  ?  8:28 AM  ?Fall Risk   ?Falls in the past year? 0 0 0 0 0  ?Number falls in past yr: 0 0 0 0 0  ?Injury with Fall? 0 0 0 0 0  ?Risk for fall due to : No Fall Risks   Impaired balance/gait History of fall(s);Impaired balance/gait  ?Follow up Falls evaluation completed      ? ? ?FALL RISK PREVENTION PERTAINING TO THE HOME: ? ?Any stairs in or around the home? Yes  ?If so, are there any without  handrails? No  ?Home free of loose throw rugs in walkways, pet beds, electrical cords, etc? No  ?Adequate lighting in your home to reduce risk of falls? Yes  ? ?ASSISTIVE DEVICES UTILIZED TO PREVENT FALLS: ? ?Life alert? N

## 2022-03-17 NOTE — Patient Instructions (Signed)
Health Maintenance, Male Adopting a healthy lifestyle and getting preventive care are important in promoting health and wellness. Ask your health care provider about: The right schedule for you to have regular tests and exams. Things you can do on your own to prevent diseases and keep yourself healthy. What should I know about diet, weight, and exercise? Eat a healthy diet  Eat a diet that includes plenty of vegetables, fruits, low-fat dairy products, and lean protein. Do not eat a lot of foods that are high in solid fats, added sugars, or sodium. Maintain a healthy weight Body mass index (BMI) is a measurement that can be used to identify possible weight problems. It estimates body fat based on height and weight. Your health care provider can help determine your BMI and help you achieve or maintain a healthy weight. Get regular exercise Get regular exercise. This is one of the most important things you can do for your health. Most adults should: Exercise for at least 150 minutes each week. The exercise should increase your heart rate and make you sweat (moderate-intensity exercise). Do strengthening exercises at least twice a week. This is in addition to the moderate-intensity exercise. Spend less time sitting. Even light physical activity can be beneficial. Watch cholesterol and blood lipids Have your blood tested for lipids and cholesterol at 42 years of age, then have this test every 5 years. You may need to have your cholesterol levels checked more often if: Your lipid or cholesterol levels are high. You are older than 42 years of age. You are at high risk for heart disease. What should I know about cancer screening? Many types of cancers can be detected early and may often be prevented. Depending on your health history and family history, you may need to have cancer screening at various ages. This may include screening for: Colorectal cancer. Prostate cancer. Skin cancer. Lung  cancer. What should I know about heart disease, diabetes, and high blood pressure? Blood pressure and heart disease High blood pressure causes heart disease and increases the risk of stroke. This is more likely to develop in people who have high blood pressure readings or are overweight. Talk with your health care provider about your target blood pressure readings. Have your blood pressure checked: Every 3-5 years if you are 18-39 years of age. Every year if you are 40 years old or older. If you are between the ages of 65 and 75 and are a current or former smoker, ask your health care provider if you should have a one-time screening for abdominal aortic aneurysm (AAA). Diabetes Have regular diabetes screenings. This checks your fasting blood sugar level. Have the screening done: Once every three years after age 45 if you are at a normal weight and have a low risk for diabetes. More often and at a younger age if you are overweight or have a high risk for diabetes. What should I know about preventing infection? Hepatitis B If you have a higher risk for hepatitis B, you should be screened for this virus. Talk with your health care provider to find out if you are at risk for hepatitis B infection. Hepatitis C Blood testing is recommended for: Everyone born from 1945 through 1965. Anyone with known risk factors for hepatitis C. Sexually transmitted infections (STIs) You should be screened each year for STIs, including gonorrhea and chlamydia, if: You are sexually active and are younger than 42 years of age. You are older than 42 years of age and your   health care provider tells you that you are at risk for this type of infection. Your sexual activity has changed since you were last screened, and you are at increased risk for chlamydia or gonorrhea. Ask your health care provider if you are at risk. Ask your health care provider about whether you are at high risk for HIV. Your health care provider  may recommend a prescription medicine to help prevent HIV infection. If you choose to take medicine to prevent HIV, you should first get tested for HIV. You should then be tested every 3 months for as long as you are taking the medicine. Follow these instructions at home: Alcohol use Do not drink alcohol if your health care provider tells you not to drink. If you drink alcohol: Limit how much you have to 0-2 drinks a day. Know how much alcohol is in your drink. In the [place] one drink equals one 12 oz bottle of beer (355 mL), one 5 oz glass of wine (148 mL), or one 1 oz glass of hard liquor (44 mL). Lifestyle Do not use any products that contain nicotine or tobacco. These products include cigarettes, chewing tobacco, and vaping devices, such as e-cigarettes. If you need help quitting, ask your health care provider. Do not use street drugs. Do not share needles. Ask your health care provider for help if you need support or information about quitting drugs. General instructions Schedule regular health, dental, and eye exams. Stay current with your vaccines. Tell your health care provider if: You often feel depressed. You have ever been abused or do not feel safe at home. Summary Adopting a healthy lifestyle and getting preventive care are important in promoting health and wellness. Follow your health care provider's instructions about healthy diet, exercising, and getting tested or screened for diseases. Follow your health care provider's instructions on monitoring your cholesterol and blood pressure. This information is not intended to replace advice given to you by your health care provider. Make sure you discuss any questions you have with your health care provider. Document Revised: 03/21/2021 Document Reviewed: 03/21/2021 Elsevier Patient Education  2023 Elsevier Inc.  

## 2022-03-20 NOTE — Progress Notes (Signed)
I discussed the AWV findings with the provider who conducted the visit. I was present in the office suite and immediately available to provide assistance and direction throughout the time the service was provided.  ?

## 2022-04-12 ENCOUNTER — Ambulatory Visit (INDEPENDENT_AMBULATORY_CARE_PROVIDER_SITE_OTHER): Payer: Medicare Other | Admitting: Internal Medicine

## 2022-04-12 VITALS — BP 126/77 | HR 79 | Temp 98.4°F | Wt 201.8 lb

## 2022-04-12 DIAGNOSIS — Z85841 Personal history of malignant neoplasm of brain: Secondary | ICD-10-CM

## 2022-04-12 DIAGNOSIS — W19XXXA Unspecified fall, initial encounter: Secondary | ICD-10-CM | POA: Diagnosis not present

## 2022-04-12 DIAGNOSIS — R531 Weakness: Secondary | ICD-10-CM | POA: Diagnosis not present

## 2022-04-12 NOTE — Progress Notes (Unsigned)
   CC: Fall  HPI:  [name] is a 41 y.o. person, with a PMH noted below, who presents to the clinic Fall. To see the management of their acute and chronic conditions, please see the A&P note under the Encounters tab.   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  Complicated by left sided motor tic disorder and panhypopituitarism.    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  Includes central diabetes insipidus, secondary hypothyroidism, secondary adrenal insufficiency, and secondary hypogonadism.    Perennial allergic rhinitis 07/09/2015   Seizure disorder ([place]) 08/08/2006   Predated brain tumor.  Generalized seizure ocurred after running out of lorazepam post-tumor.    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 Treated with alendronate from 2009-2016. DEXA (03/16/2008): L Spine T -2.0. L fem neck T -0.9, R fem neck T -1.9.  DEXA (10/15/2014): L Spine T -1.3, L fem neck T -0.6, R fem neck T -1.8.  Bisphosphonate holiday 06/2015.  Repeat DEXA scan in 10/2016 to reassess.    Review of Systems:   Review of Systems  Cardiovascular:  Negative for palpitations.  Musculoskeletal:  Positive for falls. Negative for back pain, joint pain and neck pain.  Neurological:  Negative for dizziness, focal  weakness, loss of consciousness, weakness and headaches.    Physical Exam:  Vitals:   04/12/22 1348  BP: 126/77  Pulse: 79  Temp: 98.4 F (36.9 C)  TempSrc: Oral  SpO2: 98%  Weight: 201 lb 12.8 oz (91.5 kg)   Physical Exam Constitutional:      General: He is not in acute distress.    Appearance: Normal appearance.  Cardiovascular:     Rate and Rhythm: Normal rate and regular rhythm.     Pulses: Normal pulses.     Heart sounds: Normal heart sounds. No murmur heard.   No friction rub. No gallop.  Musculoskeletal:     Right lower leg: No edema.     Left lower leg: No edema.     Comments: Strength 5/5 bilaterally in the upper and lower extremities bilaterally.  Neurological:     General: No focal deficit present.     Mental Status: He is alert and oriented to person, place, and time.     Cranial Nerves: No cranial nerve deficit.     Motor: No weakness.     Comments: CN II-XII grossly intact bilaterally, Romberg test negative. Able to ambulate unassisted, favoring LLE     Assessment & Plan:   See Encounters Tab for problem based charting.  Patient discussed with Dr. Dareen Piano  No problem-specific Assessment & Plan notes found for this encounter.

## 2022-04-12 NOTE — Patient Instructions (Addendum)
To Nicholas Holt,   It was a pleasure seeing you today! Today we discussed your recent fall two weeks ago.   For your fall, I could not find any sign of fractures or trauma. I additionally checked your strength and nerves, which appear to be doing well. We will have a physical therapist come to your house to work with gaining your confidence back. We will additionally check head imaging to ensure that nothing occurred during the fall.   We will have you follow up with Dr. Dareen Piano in 3 months,  Have a good day!  [name], MD

## 2022-04-13 DIAGNOSIS — W19XXXA Unspecified fall, initial encounter: Secondary | ICD-10-CM

## 2022-04-13 HISTORY — DX: Unspecified fall, initial encounter: W19.XXXA

## 2022-04-13 NOTE — Progress Notes (Addendum)
Internal Medicine Clinic Attending  I saw and evaluated the patient.  I personally confirmed the key portions of the history and exam documented by Dr. Winters and I reviewed pertinent patient test results.  The assessment, diagnosis, and plan were formulated together and I agree with the documentation in the resident's note.  

## 2022-04-13 NOTE — Assessment & Plan Note (Signed)
Patient presents today after having a fall 2 weeks prior.  Patient states that he was walking down the staircase when he fell, but was able to catch his fall with his arm.  He did sustain a small laceration on the left dorsal hand, but was otherwise okay. He denies having hit his head or losing consciousness.  His mother witnessed the fall and concurs that he did not sustain head trauma.  Since the fall, his mother has noticed that he is been favoring his left side more than usual, and uses his left hand to brace himself against the wall while walking.  She states that the patient never complains, but this is different from his typical behavior.  When further questioned, the patient states that he is fine, and does not feel any pain.  When asked if he can explain why he is using his left hand to brace himself against the wall, the patient responds with "I am just afraid of falling again."  His mother states that whenever Mr. Cassarino takes the stairs she is on 1 side of the staircase and her husband is on the other as they watch their son ambulate.  He has had no falls since then.  A/P: Patient presents after fall sustained 2 weeks prior.  On physical examination today, he is able to ambulate around the room several times without needing assistance.  He does favor his left side while walking, but is able to fully lift his foot from the ground.Patient denies any pain after his fall, but Mr. Moctezuma hardy and pleasant disposition, there may be a component of masking present. His knee and hip exam are completely unremarkable, with no pain present during the exam.  Likewise, his neuro exam was completely unremarkable, strength is 5 out of 5 in the upper and lower extremities bilaterally, Romberg test was negative.  Given patient's history of germinoma, will obtain MRI brain to rule out recurrence.  Otherwise, will have PT, home to work with patient on regaining his confidence with ambulation. - MR brain with and  without contrast to rule out recurrence of germinoma - Home PT

## 2022-04-19 ENCOUNTER — Other Ambulatory Visit (HOSPITAL_COMMUNITY): Payer: Medicare Other

## 2022-04-19 DIAGNOSIS — E232 Diabetes insipidus: Secondary | ICD-10-CM | POA: Diagnosis not present

## 2022-04-19 DIAGNOSIS — E230 Hypopituitarism: Secondary | ICD-10-CM | POA: Diagnosis not present

## 2022-04-19 DIAGNOSIS — E669 Obesity, unspecified: Secondary | ICD-10-CM | POA: Diagnosis not present

## 2022-04-19 DIAGNOSIS — R2689 Other abnormalities of gait and mobility: Secondary | ICD-10-CM | POA: Diagnosis not present

## 2022-04-19 DIAGNOSIS — R7309 Other abnormal glucose: Secondary | ICD-10-CM | POA: Diagnosis not present

## 2022-04-19 DIAGNOSIS — D649 Anemia, unspecified: Secondary | ICD-10-CM | POA: Diagnosis not present

## 2022-04-19 DIAGNOSIS — Z136 Encounter for screening for cardiovascular disorders: Secondary | ICD-10-CM | POA: Diagnosis not present

## 2022-04-20 ENCOUNTER — Encounter: Payer: Self-pay | Admitting: Student

## 2022-04-20 ENCOUNTER — Ambulatory Visit (INDEPENDENT_AMBULATORY_CARE_PROVIDER_SITE_OTHER): Payer: Medicare Other | Admitting: Student

## 2022-04-20 ENCOUNTER — Ambulatory Visit: Payer: Self-pay | Admitting: Physical Therapy

## 2022-04-20 ENCOUNTER — Other Ambulatory Visit: Payer: Self-pay

## 2022-04-20 VITALS — BP 145/94 | HR 78 | Temp 98.2°F | Resp 32 | Ht 69.0 in | Wt 203.6 lb

## 2022-04-20 DIAGNOSIS — R799 Abnormal finding of blood chemistry, unspecified: Secondary | ICD-10-CM

## 2022-04-20 DIAGNOSIS — T380X5A Adverse effect of glucocorticoids and synthetic analogues, initial encounter: Secondary | ICD-10-CM | POA: Diagnosis not present

## 2022-04-20 DIAGNOSIS — R270 Ataxia, unspecified: Secondary | ICD-10-CM | POA: Diagnosis not present

## 2022-04-20 DIAGNOSIS — W19XXXA Unspecified fall, initial encounter: Secondary | ICD-10-CM | POA: Diagnosis not present

## 2022-04-20 DIAGNOSIS — G40909 Epilepsy, unspecified, not intractable, without status epilepticus: Secondary | ICD-10-CM

## 2022-04-20 DIAGNOSIS — E230 Hypopituitarism: Secondary | ICD-10-CM | POA: Diagnosis not present

## 2022-04-20 DIAGNOSIS — Z85841 Personal history of malignant neoplasm of brain: Secondary | ICD-10-CM | POA: Diagnosis not present

## 2022-04-20 DIAGNOSIS — R252 Cramp and spasm: Secondary | ICD-10-CM | POA: Diagnosis not present

## 2022-04-20 DIAGNOSIS — M818 Other osteoporosis without current pathological fracture: Secondary | ICD-10-CM

## 2022-04-20 NOTE — Progress Notes (Unsigned)
   CC: Abnormal lab/falls  HPI:  [name] is a 41 y.o. male with PMH as below who presents to clinic accompanied by mother for follow-up on abnormal lab. Please see problem based charting for evaluation, assessment and plan.  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  Complicated by left sided motor tic disorder and panhypopituitarism.    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  Includes central diabetes insipidus, secondary hypothyroidism, secondary adrenal insufficiency, and secondary hypogonadism.    Perennial allergic rhinitis 07/09/2015   Seizure disorder ([place]) 08/08/2006   Predated brain tumor.  Generalized seizure ocurred after running out of lorazepam post-tumor.    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 Treated with alendronate from 2009-2016. DEXA (03/16/2008): L Spine T -2.0. L fem neck T -0.9, R fem neck T -1.9.  DEXA (10/15/2014): L Spine T -1.3, L fem neck T -0.6, R fem neck T -1.8.  Bisphosphonate holiday 06/2015.  Repeat DEXA scan in 10/2016 to reassess.     Review of Systems:  Constitutional: Negative for fever or fatigue Eyes: Negative for visual changes MSK: Negative for muscle pain Abdomen: Negative for abdominal pain, constipation or diarrhea Neuro: Positive for  falls and difficulty with balance. Negative for headache or weakness  Physical Exam: General: Pleasant, middle-age man sitting in a wheelchair.  No acute distress. Cardiac: RRR. No murmurs, rubs or gallops. No LE edema Respiratory: Lungs CTAB. No wheezing or crackles. Skin: Warm, dry and intact without rashes or lesions MSK: Chronically restricted and flexed left upper extremity. Neuro: A&O x 3. Strength 5/5 in all extremities. Normal grip strength.  Normal sensation to gross touch. Mild ataxia on ambulation. Psych: Appropriate mood and affect.  Vitals:   04/20/22 1602  BP: (!) 145/94  Pulse: 78  Resp: (!) 32  Temp: 98.2 F (36.8 C)  TempSrc: Oral  SpO2: 100%  Weight: 203 lb 9.6 oz (92.4 kg)  Height: '5\' 9"'$  (1.753 m)    Assessment & Plan:   See Encounters Tab for problem based charting.  Patient discussed with Dr.  Dani Gobble, MD, MPH

## 2022-04-20 NOTE — Patient Instructions (Signed)
Thank you, Mr.Boyde C Dowagiac for allowing [place] to provide your care today. Today we discussed your falls and difficulty walking. We want you to drink plenty of water to help improve your enzyme. We will make sure he gets PT at home.     Please follow-up after Brain MRI  Please make sure to arrive 15 minutes prior to your next appointment. If you arrive late, you may be asked to reschedule.    We look forward to seeing you next time. Please call our clinic at [phone] if you have any questions or concerns. The best time to call is Monday-Friday from 9am-4pm, but there is someone available 24/7. If after hours or the weekend, call the main hospital number and ask for the Internal Medicine Resident On-Call. If you need medication refills, please notify your pharmacy one week in advance and they will send [place] a request.   Thank you for letting us take part in your care. Wishing you the best!  Lacinda Axon, MD 04/20/2022, 4:56 PM IM Resident, PGY-2 [facility] 41:10

## 2022-04-21 ENCOUNTER — Other Ambulatory Visit: Payer: Self-pay | Admitting: Internal Medicine

## 2022-04-21 DIAGNOSIS — W19XXXA Unspecified fall, initial encounter: Secondary | ICD-10-CM

## 2022-04-22 DIAGNOSIS — M25572 Pain in left ankle and joints of left foot: Secondary | ICD-10-CM | POA: Diagnosis not present

## 2022-04-23 ENCOUNTER — Encounter: Payer: Self-pay | Admitting: Student

## 2022-04-23 NOTE — Assessment & Plan Note (Signed)
Follows with Dr. Buddy Duty, endocrinologist.  -Continue hydrocortisone, Synthroid, desmopressin and testosterone -Follow-up repeat brain MRI

## 2022-04-23 NOTE — Assessment & Plan Note (Addendum)
Patient accompanied by mother for follow-up on abnormal CK level and a recent endocrinologist office. Lab work showed CPK elevation to 501. Patient's mother is concerned that patient's alendronate is causing the elevation in his CPK which is likely contributing to patient's recent falls. Mother was informed that alendronate does not cause elevated CK levels and likely not related to patient's recent falls. Mother states she is frustrated that patient's CK levels have not been checked for more than 10 years. During the recent endocrinologist appointment, patient's liver function and kidney function were normal. Mother was told to hold all of patient's supplements as well as Flexeril in the setting of the elevated CPK levels. Mother was informed that we are unsure what is causing patient's elevation in CK but it is not related to the alendronate.  She was also informed that the treatment for elevated CK is increased fluid intake and advised to ensure patient drinks plenty of fluids.  On exam, patient has no muscle tenderness, has normal strength in all extremities but shows mild ataxia during ambulation. Patient CK can be rechecked in a few weeks and if still elevated, we will consider possible EMG.  For now, plan remains to further evaluate patient's recent falls with a repeat brain MRI.   Plan: -Increase daily fluid intake -Ordered brain MRI again as urgent to be done at [facility] as patient will require general anesthesia due to inability to lay still. -Ordered home health PT -Follow-up after brain MRI -Follow-up with endocrinology, Dr. Buddy Duty as scheduled -Consider referral for EMG if brain MRI is negative

## 2022-04-23 NOTE — Assessment & Plan Note (Signed)
Patient told to hold Flexeril in the setting of elevated CPK levels.

## 2022-04-25 NOTE — Progress Notes (Signed)
Internal Medicine Clinic Attending  Case discussed with Dr. Amponsah  At the time of the visit.  We reviewed the resident's history and exam and pertinent patient test results.  I agree with the assessment, diagnosis, and plan of care documented in the resident's note.  

## 2022-05-01 ENCOUNTER — Other Ambulatory Visit: Payer: Self-pay | Admitting: Internal Medicine

## 2022-05-01 DIAGNOSIS — F4322 Adjustment disorder with anxiety: Secondary | ICD-10-CM

## 2022-05-01 DIAGNOSIS — G40909 Epilepsy, unspecified, not intractable, without status epilepticus: Secondary | ICD-10-CM

## 2022-05-03 ENCOUNTER — Encounter: Payer: Self-pay | Admitting: Student

## 2022-05-03 ENCOUNTER — Ambulatory Visit (INDEPENDENT_AMBULATORY_CARE_PROVIDER_SITE_OTHER): Payer: Medicare Other | Admitting: Student

## 2022-05-03 ENCOUNTER — Other Ambulatory Visit: Payer: Self-pay

## 2022-05-03 VITALS — BP 134/84 | HR 69 | Temp 97.9°F | Ht 68.0 in | Wt 191.3 lb

## 2022-05-03 DIAGNOSIS — R270 Ataxia, unspecified: Secondary | ICD-10-CM | POA: Diagnosis not present

## 2022-05-03 DIAGNOSIS — W19XXXA Unspecified fall, initial encounter: Secondary | ICD-10-CM | POA: Diagnosis not present

## 2022-05-03 NOTE — Patient Instructions (Addendum)
Thank you, Nicholas Holt for allowing [place] to provide your care today. Today, we discussed your recent fall and pending MRI.  We will have the H&P filled out so you can get your MRI done as scheduled.   My Chart Access: [email or web address]  Please follow-up after your MRI  Please make sure to arrive 15 minutes prior to your next appointment. If you arrive late, you may be asked to reschedule.    We look forward to seeing you next time. Please call our clinic at [phone] if you have any questions or concerns. The best time to call is Monday-Friday from 9am-4pm, but there is someone available 24/7. If after hours or the weekend, call the main hospital number and ask for the Internal Medicine Resident On-Call. If you need medication refills, please notify your pharmacy one week in advance and they will send [place] a request.   Thank you for letting us take part in your care. Wishing you the best!  Lacinda Axon, MD 05/03/2022, 4:54 PM IM Resident, PGY-2 [facility] 41:10

## 2022-05-03 NOTE — Progress Notes (Signed)
   CC: Follow-up  HPI:  Nicholas Holt is a 41 y.o. male with PMH as below who presents to clinic for evaluation for noted to complete an H&P form for outpatient adult sedation procedures. Please see problem based charting for evaluation, assessment and plan.  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  Complicated by left sided motor tic disorder and panhypopituitarism.    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  Includes central diabetes insipidus, secondary hypothyroidism, secondary adrenal insufficiency, and secondary hypogonadism.    Perennial allergic rhinitis 07/09/2015   Seizure disorder ([place]) 08/08/2006   Predated brain tumor.  Generalized seizure ocurred after running out of lorazepam post-tumor.    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 Treated with alendronate from 2009-2016. DEXA (03/16/2008): L Spine T -2.0. L fem neck T -0.9, R fem neck T -1.9.  DEXA (10/15/2014): L Spine T -1.3, L fem neck T -0.6, R fem neck T -1.8.  Bisphosphonate holiday 06/2015.  Repeat DEXA scan in 10/2016 to reassess.    Review of Systems:  Constitutional: Negative for fever or fatigue Eyes: Negative for visual changes Respiratory: Negative for shortness of breath Cardiac: Negative for chest pain Neuro:  Positive for falls and difficulty with balance. Negative for headache, numbness or weakness  Physical Exam: General: Pleasant, middle-aged man sitting in wheelchair accompanied by mother. No acute distress. HEENT: Normocephalic/atraumatic. Cardiac: RRR. No murmurs, rubs or gallops. No LE edema Respiratory: Lungs CTAB. No wheezing or crackles. Abdomen: Soft. NT/ND. Normal bowel sounds. Skin: Warm, dry and intact without rashes or lesions Extremities: Radial and DP pulses 2+ and symmetric. MSK: Atraumatic. Chronically restricted and flexed left upper extremity. Neuro: A&O x 3. Strength 5/5 in all extremities. Normal sensation to gross touch. Mild ataxia with ambulation.  Psych: Appropriate mood and affect.  Vitals:   05/03/22 1604  BP: 134/84  Pulse: 69  Temp: 97.9 F (36.6 C)  TempSrc: Oral  SpO2: 100%  Weight: 191 lb 4.8 oz (86.8 kg)  Height: '5\' 8"'$  (1.727 m)    Assessment & Plan:   See Encounters Tab for problem based charting.  Patient discussed with Dr. Lockie Pares, MD, MPH

## 2022-05-04 ENCOUNTER — Encounter: Payer: Self-pay | Admitting: Student

## 2022-05-04 ENCOUNTER — Telehealth: Payer: Self-pay | Admitting: Student

## 2022-05-04 DIAGNOSIS — I6381 Other cerebral infarction due to occlusion or stenosis of small artery: Secondary | ICD-10-CM | POA: Insufficient documentation

## 2022-05-04 DIAGNOSIS — R270 Ataxia, unspecified: Secondary | ICD-10-CM | POA: Insufficient documentation

## 2022-05-04 NOTE — Telephone Encounter (Signed)
I have sent a referral to [facility].  Thanks.

## 2022-05-04 NOTE — Telephone Encounter (Signed)
Patient seen 05/03/2022. Rec'd a call from the patient's mother requesting a Referral to the [place].    It looks like the patient is already sch for 05/09/2022 and will need a referral.  Please advise if a referral Can be placed.

## 2022-05-09 ENCOUNTER — Ambulatory Visit (INDEPENDENT_AMBULATORY_CARE_PROVIDER_SITE_OTHER): Payer: Medicare Other | Admitting: Podiatry

## 2022-05-09 DIAGNOSIS — M79674 Pain in right toe(s): Secondary | ICD-10-CM

## 2022-05-09 DIAGNOSIS — M79675 Pain in left toe(s): Secondary | ICD-10-CM

## 2022-05-09 DIAGNOSIS — M21372 Foot drop, left foot: Secondary | ICD-10-CM | POA: Diagnosis not present

## 2022-05-09 DIAGNOSIS — B351 Tinea unguium: Secondary | ICD-10-CM

## 2022-05-11 ENCOUNTER — Other Ambulatory Visit: Payer: Self-pay | Admitting: Internal Medicine

## 2022-05-11 DIAGNOSIS — E230 Hypopituitarism: Secondary | ICD-10-CM

## 2022-05-12 ENCOUNTER — Other Ambulatory Visit: Payer: Self-pay | Admitting: *Deleted

## 2022-05-12 DIAGNOSIS — E230 Hypopituitarism: Secondary | ICD-10-CM

## 2022-05-12 MED ORDER — HYDROCORTISONE 10 MG PO TABS: ORAL_TABLET | ORAL | 11 refills | Status: DC

## 2022-05-22 NOTE — Progress Notes (Signed)
Subjective:   Patient ID: Nicholas Holt, male   DOB: 41 y.o.   MRN: [id number]   HPI 41 year old male presents the office today with his mom for concerns of thick, discolored toenails that are not able to trim himself.  No swelling redness or any drainage.  Also asking there is any types of brace that can be used to help him walk better.  His mom states that he "skips" when he walks.  He previously did maintain her which was removed which resulted in gait abnormality.  Previously did roll his ankle and he was seen at [facility] and was told nothing was broken.   Review of Systems  All other systems reviewed and are negative.  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  Complicated by left sided motor tic disorder and panhypopituitarism.    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  Includes central diabetes insipidus, secondary hypothyroidism, secondary adrenal insufficiency, and secondary hypogonadism.    Perennial allergic rhinitis 07/09/2015   Seizure disorder ([place]) 08/08/2006   Predated brain tumor.  Generalized seizure ocurred after running out of lorazepam post-tumor.    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 Treated with alendronate from 2009-2016. DEXA (03/16/2008): L Spine T -2.0.  L fem neck T -0.9, R fem neck T -1.9.  DEXA (10/15/2014): L Spine T -1.3, L fem neck T -0.6, R fem neck T -1.8.  Bisphosphonate holiday 06/2015.  Repeat DEXA scan in 10/2016 to reassess.     Past Surgical History:  Procedure Laterality Date   BRAIN BIOPSY  1997   CSF SHUNT  1997   PORTACATH PLACEMENT     RADIOLOGY WITH ANESTHESIA N/A 08/30/2021   Procedure: MRI WITH ANESTHESIA BRAIN WITH AND WITHOUT CONTRAST;  Surgeon: Radiologist, Medication, MD;  Location: [place];  Service: Radiology;  Laterality: N/A;     Current Outpatient Medications:    albuterol (PROAIR HFA) 108 (90 Base) MCG/ACT inhaler, Inhale 1-2 puffs into the lungs every 6 (six) hours as needed., Disp: 1 Inhaler, Rfl: 2   alendronate (FOSAMAX) 70 MG tablet, TAKE 1 TABLET BY MOUTH ONCE A WEEK, AS DIRECTED, Disp: 4 tablet, Rfl: 11   amLODipine (NORVASC) 5 MG tablet, TAKE 1 TABLET(5 MG) BY MOUTH DAILY, Disp: 90 tablet, Rfl: 3   Ascorbic Acid (VITAMIN C) 1000 MG tablet, Take 1,000 mg by mouth daily., Disp: , Rfl:    Blood Pressure Monitor KIT, Please take your blood pressure daily in the morning, Disp: 1 kit, Rfl: 0   cyclobenzaprine (FLEXERIL) 10 MG tablet, TAKE 1 TO 2 TABLETS BY MOUTH AT BEDTIME AS NEEDED FOR MUSCLE SPASMS (Patient taking differently: Take 10 mg by mouth at bedtime as needed for muscle spasms.), Disp: 120 tablet, Rfl: 0   desmopressin (DDAVP NASAL) 0.01 % solution, USE ONE SPRAY IN EACH NOSTRIL FOUR TIMES DAILY, Disp: 70 mL, Rfl: 3   fluticasone (FLONASE)  50 MCG/ACT nasal spray, Place 1 spray into both nostrils daily. (Patient taking differently: Place 1 spray into both nostrils at bedtime as needed for allergies.), Disp: 48 g, Rfl: 0   hydrocortisone (CORTEF) 10 MG tablet, TAKE 1 AND 1/2 TABLET BY MOUTH DAILY; DOUBLE THE DOSE ON SICK DAYS (Patient taking differently: Take 5 mg by mouth in the morning.), Disp: 135 tablet, Rfl: 11   levothyroxine (SYNTHROID) 100 MCG tablet, Take 100 mcg by mouth daily before breakfast.,  Disp: , Rfl:    LORazepam (ATIVAN) 2 MG tablet, TAKE 3 TABLETS(6 MG) BY MOUTH AT BEDTIME, Disp: 90 tablet, Rfl: 0   potassium chloride (KLOR-CON) 10 MEQ tablet, TAKE 2 TABLETS(20 MEQ) BY MOUTH TWICE DAILY, Disp: 360 tablet, Rfl: 3   Testosterone (ANDROGEL PUMP) 20.25 MG/ACT (1.62%) GEL, Place 20.25 mg onto the skin daily. Apply 6 pumps 3 pumps per side) topically to shoulders or upper back once a day (Patient taking differently: Place 6 Pump onto the skin at bedtime. Apply 6 pumps 3 pumps per side) topically to shoulders or upper back once a day), Disp: 75 g, Rfl: 3  Allergies  Allergen Reactions   Codeine Other (See Comments)    Shaking            Objective:  Physical Exam  General: AAO x3, NAD  Dermatological: Nails are hypertrophic, dystrophic, brittle, discolored, elongated 10. No surrounding redness or drainage. Tenderness nails 1-5 bilaterally. No open lesions or pre-ulcerative lesions are identified today.  Vascular: Dorsalis Pedis artery and Posterior Tibial artery pedal pulses are palpable bilateral with immedate capillary fill time. There is no pain with calf compression, swelling, warmth, erythema.   Neruologic: Patient decreased with Semmes Weinstein monofilament.  Musculoskeletal: Upon weightbearing evaluation he has dropfoot and he is walking more on the ball of the foot on the left side and not able to dorsiflex.  Muscular strength 5/5 in all groups tested bilateral.  Assessment:   41 year old male with symptomatic onychosis, dropfoot     Plan:  -Treatment options discussed including all alternatives, risks, and complications -Etiology of symptoms were discussed -Nails debrided 10 without complications or bleeding.  Discussed different treatment options for nail fungus however at this time was to proceed with routine debridement. -Prescription for AFO brace was written for [place] [facility]. -Daily foot inspection -Follow-up in 3 months or sooner if any problems  arise. In the meantime, encouraged to call the office with any questions, concerns, change in symptoms.   Celesta Gentile, DPM

## 2022-05-23 ENCOUNTER — Encounter (HOSPITAL_COMMUNITY): Payer: Self-pay | Admitting: *Deleted

## 2022-05-23 NOTE — Progress Notes (Signed)
PCP - Dr Aldine Contes Cardiologist - n/a  Chest x-ray - n/a EKG - 08/30/21 Stress Test - n/a ECHO - n/a Cardiac Cath - n/a  ICD Pacemaker/Loop - n/a  Sleep Study -  n/a CPAP - none  Anesthesia review: Yes  STOP now taking any Aspirin (unless otherwise instructed by your surgeon), Aleve, Naproxen, Ibuprofen, Motrin, Advil, Goody's, BC's, all herbal medications, fish oil, and all vitamins.   Coronavirus Screening Does the patient have any of the following symptoms:  Cough yes/no: No Fever (>100.59F)  yes/no: No Runny nose yes/no: No Sore throat yes/no: No Difficulty breathing/shortness of breath  yes/no: No  Has the patient traveled in the last 14 days and where? yes/no: No  Mother Nocomus Lack verbalized understanding of instructions that were given via phone.

## 2022-05-24 ENCOUNTER — Encounter (HOSPITAL_COMMUNITY): Payer: Self-pay | Admitting: *Deleted

## 2022-05-25 ENCOUNTER — Telehealth: Payer: Self-pay | Admitting: Internal Medicine

## 2022-05-25 ENCOUNTER — Ambulatory Visit (HOSPITAL_COMMUNITY)
Admission: RE | Admit: 2022-05-25 | Discharge: 2022-05-25 | Disposition: A | Discharge status: Home / Self Care | Payer: Medicare Other | Source: Ambulatory Visit | Attending: Internal Medicine | Admitting: Internal Medicine

## 2022-05-25 ENCOUNTER — Encounter (HOSPITAL_COMMUNITY): Payer: Self-pay

## 2022-05-25 ENCOUNTER — Ambulatory Visit (HOSPITAL_COMMUNITY)
Admission: RE | Admit: 2022-05-25 | Discharge: 2022-05-25 | Disposition: A | Discharge status: Home / Self Care | Payer: Medicare Other | Attending: Student | Admitting: Student

## 2022-05-25 ENCOUNTER — Ambulatory Visit (HOSPITAL_COMMUNITY): Payer: Medicare Other | Admitting: Physician Assistant

## 2022-05-25 ENCOUNTER — Ambulatory Visit (HOSPITAL_BASED_OUTPATIENT_CLINIC_OR_DEPARTMENT_OTHER): Payer: Medicare Other | Admitting: Physician Assistant

## 2022-05-25 ENCOUNTER — Encounter (HOSPITAL_COMMUNITY): Admission: RE | Disposition: A | Discharge status: Home / Self Care | Payer: Self-pay | Source: Home / Self Care

## 2022-05-25 DIAGNOSIS — R270 Ataxia, unspecified: Secondary | ICD-10-CM

## 2022-05-25 DIAGNOSIS — I10 Essential (primary) hypertension: Secondary | ICD-10-CM | POA: Diagnosis not present

## 2022-05-25 DIAGNOSIS — S0990XA Unspecified injury of head, initial encounter: Secondary | ICD-10-CM | POA: Diagnosis not present

## 2022-05-25 DIAGNOSIS — F419 Anxiety disorder, unspecified: Secondary | ICD-10-CM | POA: Diagnosis not present

## 2022-05-25 DIAGNOSIS — W19XXXA Unspecified fall, initial encounter: Secondary | ICD-10-CM | POA: Insufficient documentation

## 2022-05-25 DIAGNOSIS — Z85841 Personal history of malignant neoplasm of brain: Secondary | ICD-10-CM | POA: Insufficient documentation

## 2022-05-25 DIAGNOSIS — G119 Hereditary ataxia, unspecified: Secondary | ICD-10-CM | POA: Diagnosis not present

## 2022-05-25 DIAGNOSIS — I6381 Other cerebral infarction due to occlusion or stenosis of small artery: Secondary | ICD-10-CM | POA: Diagnosis not present

## 2022-05-25 DIAGNOSIS — Z982 Presence of cerebrospinal fluid drainage device: Secondary | ICD-10-CM | POA: Diagnosis not present

## 2022-05-25 HISTORY — DX: Prediabetes: R73.03

## 2022-05-25 HISTORY — DX: Other abnormalities of gait and mobility: R26.89

## 2022-05-25 HISTORY — PX: RADIOLOGY WITH ANESTHESIA: SHX6223

## 2022-05-25 LAB — CBC
HCT: 38.7 % — ABNORMAL LOW (ref 39.0–52.0)
Hemoglobin: 13.2 g/dL (ref 13.0–17.0)
MCH: 29.9 pg (ref 26.0–34.0)
MCHC: 34.1 g/dL (ref 30.0–36.0)
MCV: 87.6 fL (ref 80.0–100.0)
Platelets: 204 10*3/uL (ref 150–400)
RBC: 4.42 MIL/uL (ref 4.22–5.81)
RDW: 13.9 % (ref 11.5–15.5)
WBC: 4.7 10*3/uL (ref 4.0–10.5)
nRBC: 0 % (ref 0.0–0.2)

## 2022-05-25 LAB — BASIC METABOLIC PANEL
Anion gap: 9 (ref 5–15)
BUN: 11 mg/dL (ref 6–20)
CO2: 25 mmol/L (ref 22–32)
Calcium: 9.2 mg/dL (ref 8.9–10.3)
Chloride: 102 mmol/L (ref 98–111)
Creatinine, Ser: 1.14 mg/dL (ref 0.61–1.24)
GFR, Estimated: >60 mL/min (ref >60)
Glucose, Bld: 87 mg/dL (ref 70–99)
Potassium: 4.2 mmol/L (ref 3.5–5.1)
Sodium: 136 mmol/L (ref 135–145)

## 2022-05-25 SURGERY — MRI WITH ANESTHESIA
Anesthesia: General

## 2022-05-25 MED ORDER — ORAL CARE MOUTH RINSE: 15.0000 mL | Freq: Once | OROMUCOSAL | Status: AC

## 2022-05-25 MED ORDER — GADOBUTROL 1 MMOL/ML IV SOLN
7.5000 mL | Freq: Once | INTRAVENOUS | Status: AC | PRN
  Administered 2022-05-25: 7.5 mL via INTRAVENOUS

## 2022-05-25 MED ORDER — PROPOFOL 10 MG/ML IV BOLUS
INTRAVENOUS | Status: DC | PRN
  Administered 2022-05-25: 140 mg via INTRAVENOUS

## 2022-05-25 MED ORDER — ROCURONIUM BROMIDE 10 MG/ML (PF) SYRINGE
PREFILLED_SYRINGE | INTRAVENOUS | Status: DC | PRN
  Administered 2022-05-25: 60 mg via INTRAVENOUS

## 2022-05-25 MED ORDER — CHLORHEXIDINE GLUCONATE 0.12 % MT SOLN
15.0000 mL | Freq: Once | OROMUCOSAL | Status: AC
  Administered 2022-05-25: 15 mL via OROMUCOSAL
  Filled 2022-05-25: qty 15

## 2022-05-25 MED ORDER — DEXAMETHASONE SODIUM PHOSPHATE 10 MG/ML IJ SOLN
INTRAMUSCULAR | Status: DC | PRN
  Administered 2022-05-25: 10 mg via INTRAVENOUS

## 2022-05-25 MED ORDER — ONDANSETRON HCL 4 MG/2ML IJ SOLN
INTRAMUSCULAR | Status: DC | PRN
  Administered 2022-05-25: 4 mg via INTRAVENOUS

## 2022-05-25 MED ORDER — PHENYLEPHRINE HCL-NACL 20-0.9 MG/250ML-% IV SOLN
INTRAVENOUS | Status: DC | PRN
  Administered 2022-05-25: 20 ug/min via INTRAVENOUS

## 2022-05-25 MED ORDER — SUGAMMADEX SODIUM 200 MG/2ML IV SOLN
INTRAVENOUS | Status: DC | PRN
  Administered 2022-05-25: 200 mg via INTRAVENOUS

## 2022-05-25 MED ORDER — PHENYLEPHRINE 80 MCG/ML (10ML) SYRINGE FOR IV PUSH (FOR BLOOD PRESSURE SUPPORT)
PREFILLED_SYRINGE | INTRAVENOUS | Status: DC | PRN
  Administered 2022-05-25: 40 ug via INTRAVENOUS

## 2022-05-25 MED ORDER — FENTANYL CITRATE (PF) 250 MCG/5ML IJ SOLN
INTRAMUSCULAR | Status: DC | PRN
  Administered 2022-05-25: 100 ug via INTRAVENOUS

## 2022-05-25 MED ORDER — LACTATED RINGERS IV SOLN: INTRAVENOUS | Status: DC

## 2022-05-25 NOTE — Anesthesia Procedure Notes (Signed)
Procedure Name: Intubation Date/Time: 05/25/2022 9:19 AM  Performed by: Colin Benton, CRNAPre-anesthesia Checklist: Patient identified, Emergency Drugs available, Suction available and Patient being monitored Patient Re-evaluated:Patient Re-evaluated prior to induction Oxygen Delivery Method: Circle System Utilized Preoxygenation: Pre-oxygenation with 100% oxygen Induction Type: IV induction Ventilation: Mask ventilation without difficulty Laryngoscope Size: Miller and 2 Grade View: Grade II Tube type: Oral Tube size: 7.5 mm Number of attempts: 1 Airway Equipment and Method: Stylet Placement Confirmation: ETT inserted through vocal cords under direct vision, positive ETCO2 and breath sounds checked- equal and bilateral Secured at: 22 cm Tube secured with: Tape Dental Injury: Teeth and Oropharynx as per pre-operative assessment

## 2022-05-25 NOTE — Assessment & Plan Note (Signed)
I called and spoke with the patient's mother regarding the results of his MRI scan.  Patient's MRI showed no evidence of recurrent malignancy but did show evidence of prior hemorrhages and lacunar infarcts.  Patient's mother states that patient's balance is getting better and that he is not as unsteady as he was before.  He is also in the process of obtaining a left leg brace to help with his ambulation.  Has had some difficulty with walking with his left foot (walks on his toes) since his tumor resection surgery.  Mother also states that she has postponed physical therapy till he is able to obtain the brace and got the results of his MRI scan.  We did talk about the MRI showing some evidence of prior hemorrhages and infarcts.  We will have the patient follow-up with neurology but do not think we need to do anything acutely for this at this time.  Patient's mother is also concerned about his elevated CK levels.  Patient did have a work-up for this back in 2011 with Dr. Marinda Elk and [facility] neurology.  At that time an EMG/nerve conduction study was done and a decision was made that a muscle biopsy was not needed at that time.  Patient's CK at his endocrinology office was elevated to 501.  Patient had multiple CK levels between 2009 and 2011 ranging from 500s to 900s.  His current CK level remains consistent with that.  I do not think that he needs a muscle biopsy at this time given his prior negative work-up.  However, since he is following up with [facility] neurology for his MRI findings I will also ask them their opinion about further work-up for his elevated CK including possible muscle biopsy.  No further work-up at this time.  Patient's mother expressed understanding and is in agreement with plan.

## 2022-05-25 NOTE — Assessment & Plan Note (Signed)
I called and spoke with the patient's mother regarding the results of his MRI scan.  Patient's MRI showed no evidence of recurrent malignancy but did show evidence of prior hemorrhages and lacunar infarcts.  Patient's mother states that patient's balance is getting better and that he is not as unsteady as he was before.  He is also in the process of obtaining a left leg brace to help with his ambulation.  Has had some difficulty with walking with his left foot (walks on his toes) since his tumor resection surgery.  Mother also states that she has postponed physical therapy till he is able to obtain the brace and got the results of his MRI scan.  We did talk about the MRI showing some evidence of prior hemorrhages and infarcts.  We will have the patient follow-up with neurology but do not think we need to do anything acutely for this at this time.  Patient's mother is also concerned about his elevated CK levels.  Patient did have a work-up for this back in 2011 with Dr. Marinda Elk and [facility] neurology.  At that time an EMG/nerve conduction study was done and a decision was made that a muscle biopsy was not needed at that time.  Patient's CK at his endocrinology office was elevated to 501.  Patient had multiple CK levels between 2009 and 2011 ranging from 500s to 900s.  His current CK level remains consistent with that.  I do not think that he needs a muscle biopsy at this time given his prior negative work-up.  However, since he is following up with [facility] neurology for his MRI findings I will also ask them their opinion about further work-up for his elevated CK including possible muscle biopsy.  No further work-up at this time.  Patient's mother expressed understanding and is in agreement with plan.

## 2022-05-25 NOTE — Transfer of Care (Signed)
Immediate Anesthesia Transfer of Care Note  Patient: Nicholas Holt  Procedure(s) Performed: MRI WITH ANESTHESIA OF BRAIN WITH AND WITHOUT CONTRAST  Patient Location: PACU  Anesthesia Type:General  Level of Consciousness: drowsy  Airway & Oxygen Therapy: Patient Spontanous Breathing and Patient connected to nasal cannula oxygen  Post-op Assessment: Report given to RN and Post -op Vital signs reviewed and stable  Post vital signs: Reviewed and stable  Last Vitals:  Vitals Value Taken Time  BP 126/77 05/25/22 1018  Temp 36.1 C 05/25/22 1018  Pulse 67 05/25/22 1021  Resp 15 05/25/22 1021  SpO2 100 % 05/25/22 1021  Vitals shown include unvalidated device data.  Last Pain:  Vitals:   05/25/22 1018  TempSrc:   PainSc: Asleep         Complications: No notable events documented.

## 2022-05-25 NOTE — Telephone Encounter (Addendum)
I called and spoke with the patient's mother regarding the results of his MRI scan.  Patient's MRI showed no evidence of recurrent malignancy but did show evidence of prior hemorrhages and lacunar infarcts.  Patient's mother states that patient's balance is getting better and that he is not as unsteady as he was before.  He is also in the process of obtaining a left leg brace to help with his ambulation.  Has had some difficulty with walking with his left foot (walks on his toes) since his tumor resection surgery.  Mother also states that she has postponed physical therapy till he is able to obtain the brace and got the results of his MRI scan.  We did talk about the MRI showing some evidence of prior hemorrhages and infarcts.  We will have the patient follow-up with neurology but do not think we need to do anything acutely for this at this time.  Patient's mother is also concerned about his elevated CK levels.  Patient did have a work-up for this back in 2011 with Dr. Marinda Elk and [facility] neurology.  At that time an EMG/nerve conduction study was done and a decision was made that a muscle biopsy was not needed at that time.  Patient's CK at his endocrinology office was elevated to 501.  Patient had multiple CK levels between 2009 and 2011 ranging from 500s to 900s.  His current CK level remains consistent with that.  I do not think that he needs a muscle biopsy at this time given his prior negative work-up.  However, since he is following up with [facility] neurology for his MRI findings I will also ask them their opinion about further work-up for his elevated CK including possible muscle biopsy.  No further work-up at this time.  Patient's mother expressed understanding and is in agreement with plan.

## 2022-05-25 NOTE — Anesthesia Preprocedure Evaluation (Signed)
Anesthesia Evaluation  Patient identified by MRN, date of birth, ID band Patient awake    Reviewed: Allergy & Precautions, NPO status , Patient's Chart, lab work & pertinent test results  History of Anesthesia Complications Negative for: history of anesthetic complications  Airway Mallampati: III  TM Distance: >3 FB Neck ROM: Full    Dental  (+) Dental Advisory Given, Teeth Intact   Pulmonary neg pulmonary ROS,    breath sounds clear to auscultation       Cardiovascular hypertension,  Rhythm:Regular     Neuro/Psych Seizures -, Well Controlled,  PSYCHIATRIC DISORDERS Anxiety Suprasellar germinoma treated at [facility] in 1997; received chemotherapy (cisplatin, etoposide, vincristine, and cyclophosphomide) and radiation therapy.   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) ([place])  Renal/GU      Musculoskeletal negative musculoskeletal ROS (+)   Abdominal   Peds  Hematology negative hematology ROS (+) Lab Results      Component                Value               Date                      WBC                      4.7                 05/25/2022                HGB                      13.2                05/25/2022                HCT                      38.7 (L)            05/25/2022                MCV                      87.6                05/25/2022                PLT                      204                 05/25/2022              Anesthesia Other Findings   Reproductive/Obstetrics                             Anesthesia Physical Anesthesia Plan  ASA: 3  Anesthesia Plan: General   Post-op Pain Management: Minimal or no pain anticipated   Induction: Intravenous  PONV Risk Score and Plan: 2 and Ondansetron and Dexamethasone  Airway Management Planned: LMA  Additional Equipment: None  Intra-op Plan:    Post-operative Plan: Extubation in OR  Informed Consent: I have reviewed the patients History and Physical, chart, labs and discussed the procedure including the risks, benefits  and alternatives for the proposed anesthesia with the patient or authorized representative who has indicated his/her understanding and acceptance.     Dental advisory given  Plan Discussed with: CRNA  Anesthesia Plan Comments:         Anesthesia Quick Evaluation

## 2022-05-27 ENCOUNTER — Encounter (HOSPITAL_COMMUNITY): Payer: Self-pay | Admitting: Radiology

## 2022-05-29 ENCOUNTER — Other Ambulatory Visit: Payer: Self-pay

## 2022-05-29 ENCOUNTER — Telehealth: Payer: Self-pay | Admitting: Internal Medicine

## 2022-05-29 ENCOUNTER — Encounter (HOSPITAL_COMMUNITY): Payer: Self-pay | Admitting: Student in an Organized Health Care Education/Training Program

## 2022-05-29 ENCOUNTER — Observation Stay (HOSPITAL_COMMUNITY)
Admission: AD | Admit: 2022-05-29 | Discharge: 2022-05-30 | Disposition: A | Discharge status: Home / Self Care | Payer: Medicare Other | Source: Ambulatory Visit | Attending: Student in an Organized Health Care Education/Training Program | Admitting: Student in an Organized Health Care Education/Training Program

## 2022-05-29 DIAGNOSIS — R270 Ataxia, unspecified: Secondary | ICD-10-CM | POA: Diagnosis not present

## 2022-05-29 DIAGNOSIS — Z79899 Other long term (current) drug therapy: Secondary | ICD-10-CM | POA: Diagnosis not present

## 2022-05-29 DIAGNOSIS — I6381 Other cerebral infarction due to occlusion or stenosis of small artery: Secondary | ICD-10-CM | POA: Diagnosis not present

## 2022-05-29 DIAGNOSIS — E039 Hypothyroidism, unspecified: Secondary | ICD-10-CM | POA: Insufficient documentation

## 2022-05-29 DIAGNOSIS — I10 Essential (primary) hypertension: Secondary | ICD-10-CM | POA: Diagnosis not present

## 2022-05-29 DIAGNOSIS — Z85841 Personal history of malignant neoplasm of brain: Secondary | ICD-10-CM | POA: Insufficient documentation

## 2022-05-29 DIAGNOSIS — R531 Weakness: Secondary | ICD-10-CM

## 2022-05-29 DIAGNOSIS — R2689 Other abnormalities of gait and mobility: Secondary | ICD-10-CM | POA: Diagnosis present

## 2022-05-29 DIAGNOSIS — E871 Hypo-osmolality and hyponatremia: Secondary | ICD-10-CM | POA: Diagnosis not present

## 2022-05-29 DIAGNOSIS — E785 Hyperlipidemia, unspecified: Secondary | ICD-10-CM | POA: Diagnosis present

## 2022-05-29 DIAGNOSIS — E230 Hypopituitarism: Secondary | ICD-10-CM | POA: Diagnosis present

## 2022-05-29 LAB — PROTIME-INR
INR: 0.9 (ref 0.8–1.2)
Prothrombin Time: 12.5 seconds (ref 11.4–15.2)

## 2022-05-29 LAB — CBC WITH DIFFERENTIAL/PLATELET
Abs Immature Granulocytes: 0.02 10*3/uL (ref 0.00–0.07)
Basophils Absolute: 0 10*3/uL (ref 0.0–0.1)
Basophils Relative: 1 %
Eosinophils Absolute: 0.1 10*3/uL (ref 0.0–0.5)
Eosinophils Relative: 2 %
HCT: 39.6 % (ref 39.0–52.0)
Hemoglobin: 13.7 g/dL (ref 13.0–17.0)
Immature Granulocytes: 0 %
Lymphocytes Relative: 45 %
Lymphs Abs: 2.9 10*3/uL (ref 0.7–4.0)
MCH: 29.7 pg (ref 26.0–34.0)
MCHC: 34.6 g/dL (ref 30.0–36.0)
MCV: 85.7 fL (ref 80.0–100.0)
Monocytes Absolute: 0.5 10*3/uL (ref 0.1–1.0)
Monocytes Relative: 7 %
Neutro Abs: 2.9 10*3/uL (ref 1.7–7.7)
Neutrophils Relative %: 45 %
Platelets: 219 10*3/uL (ref 150–400)
RBC: 4.62 MIL/uL (ref 4.22–5.81)
RDW: 13.3 % (ref 11.5–15.5)
WBC: 6.4 10*3/uL (ref 4.0–10.5)
nRBC: 0 % (ref 0.0–0.2)

## 2022-05-29 LAB — APTT: aPTT: 35 seconds (ref 24–36)

## 2022-05-29 LAB — COMPREHENSIVE METABOLIC PANEL
ALT: 25 U/L (ref 0–44)
AST: 22 U/L (ref 15–41)
Albumin: 4.5 g/dL (ref 3.5–5.0)
Alkaline Phosphatase: 53 U/L (ref 38–126)
Anion gap: 10 (ref 5–15)
BUN: 12 mg/dL (ref 6–20)
CO2: 25 mmol/L (ref 22–32)
Calcium: 8.8 mg/dL — ABNORMAL LOW (ref 8.9–10.3)
Chloride: 93 mmol/L — ABNORMAL LOW (ref 98–111)
Creatinine, Ser: 1.01 mg/dL (ref 0.61–1.24)
GFR, Estimated: >60 mL/min (ref >60)
Glucose, Bld: 95 mg/dL (ref 70–99)
Potassium: 3.7 mmol/L (ref 3.5–5.1)
Sodium: 128 mmol/L — ABNORMAL LOW (ref 135–145)
Total Bilirubin: 0.4 mg/dL (ref 0.3–1.2)
Total Protein: 7.7 g/dL (ref 6.5–8.1)

## 2022-05-29 LAB — TSH: TSH: 0.138 u[IU]/mL — ABNORMAL LOW (ref 0.350–4.500)

## 2022-05-29 LAB — HIV ANTIBODY (ROUTINE TESTING W REFLEX): HIV Screen 4th Generation wRfx: NONREACTIVE

## 2022-05-29 LAB — PHOSPHORUS: Phosphorus: 3.8 mg/dL (ref 2.5–4.6)

## 2022-05-29 LAB — CK: Total CK: 474 U/L — ABNORMAL HIGH (ref 49–397)

## 2022-05-29 LAB — HEMOGLOBIN A1C
Hgb A1c MFr Bld: 5.8 % — ABNORMAL HIGH (ref 4.8–5.6)
Mean Plasma Glucose: 119.76 mg/dL

## 2022-05-29 LAB — T4, FREE: Free T4: 1.18 ng/dL — ABNORMAL HIGH (ref 0.61–1.12)

## 2022-05-29 LAB — MAGNESIUM: Magnesium: 2.1 mg/dL (ref 1.7–2.4)

## 2022-05-29 MED ORDER — DESMOPRESSIN ACE SPRAY REFRIG 0.01 % NA SOLN
10.0000 ug | Freq: Four times a day (QID) | NASAL | Status: DC
Start: 2022-05-29 — End: 2022-05-30
  Administered 2022-05-29 – 2022-05-30 (×4): 1 via NASAL
  Filled 2022-05-29: qty 5

## 2022-05-29 MED ORDER — POTASSIUM CHLORIDE ER 10 MEQ PO TBCR
20.0000 meq | EXTENDED_RELEASE_TABLET | Freq: Two times a day (BID) | ORAL | Status: DC
  Administered 2022-05-30: 20 meq via ORAL
  Filled 2022-05-29 (×5): qty 2

## 2022-05-29 MED ORDER — AMLODIPINE BESYLATE 5 MG PO TABS
5.0000 mg | ORAL_TABLET | Freq: Every evening | ORAL | Status: DC
  Administered 2022-05-29 – 2022-05-30 (×2): 5 mg via ORAL
  Filled 2022-05-29 (×2): qty 1

## 2022-05-29 MED ORDER — TESTOSTERONE 50 MG/5GM (1%) TD GEL
10.0000 g | Freq: Every day | TRANSDERMAL | Status: DC
  Administered 2022-05-29: 10 g via TRANSDERMAL
  Filled 2022-05-29 (×2): qty 10

## 2022-05-29 MED ORDER — ONDANSETRON HCL 4 MG/2ML IJ SOLN: 4.0000 mg | Freq: Four times a day (QID) | INTRAMUSCULAR | Status: DC | PRN

## 2022-05-29 MED ORDER — SENNOSIDES-DOCUSATE SODIUM 8.6-50 MG PO TABS: 1.0000 | ORAL_TABLET | Freq: Every evening | ORAL | Status: DC | PRN

## 2022-05-29 MED ORDER — LEVOTHYROXINE SODIUM 100 MCG PO TABS
100.0000 ug | ORAL_TABLET | Freq: Every day | ORAL | Status: DC
  Administered 2022-05-30: 100 ug via ORAL
  Filled 2022-05-29: qty 1

## 2022-05-29 MED ORDER — LORAZEPAM 1 MG PO TABS
6.0000 mg | ORAL_TABLET | Freq: Every day | ORAL | Status: DC
  Administered 2022-05-29: 6 mg via ORAL
  Filled 2022-05-29: qty 6

## 2022-05-29 MED ORDER — ACETAMINOPHEN 650 MG RE SUPP: 650.0000 mg | Freq: Four times a day (QID) | RECTAL | Status: DC | PRN

## 2022-05-29 MED ORDER — ACETAMINOPHEN 325 MG PO TABS: 650.0000 mg | ORAL_TABLET | Freq: Four times a day (QID) | ORAL | Status: DC | PRN

## 2022-05-29 MED ORDER — CYCLOBENZAPRINE HCL 10 MG PO TABS: 10.0000 mg | ORAL_TABLET | Freq: Every evening | ORAL | Status: DC | PRN

## 2022-05-29 MED ORDER — ONDANSETRON HCL 4 MG PO TABS: 4.0000 mg | ORAL_TABLET | Freq: Four times a day (QID) | ORAL | Status: DC | PRN

## 2022-05-29 MED ORDER — ENOXAPARIN SODIUM 40 MG/0.4ML IJ SOSY
40.0000 mg | PREFILLED_SYRINGE | INTRAMUSCULAR | Status: DC
  Administered 2022-05-29: 40 mg via SUBCUTANEOUS
  Filled 2022-05-29: qty 0.4

## 2022-05-29 MED ORDER — HYDROCORTISONE 5 MG PO TABS
5.0000 mg | ORAL_TABLET | Freq: Every morning | ORAL | Status: DC
  Administered 2022-05-30: 5 mg via ORAL
  Filled 2022-05-29 (×2): qty 1

## 2022-05-29 NOTE — Anesthesia Postprocedure Evaluation (Signed)
Anesthesia Post Note  Patient: Nicholas Holt  Procedure(s) Performed: MRI WITH ANESTHESIA OF BRAIN WITH AND WITHOUT CONTRAST     Patient location during evaluation: PACU Anesthesia Type: General Level of consciousness: awake and alert Pain management: pain level controlled Vital Signs Assessment: post-procedure vital signs reviewed and stable Respiratory status: spontaneous breathing, nonlabored ventilation and respiratory function stable Cardiovascular status: blood pressure returned to baseline and stable Postop Assessment: no apparent nausea or vomiting Anesthetic complications: no   No notable events documented.  Last Vitals:  Vitals:   05/25/22 1018 05/25/22 1033  BP: 126/77 131/84  Pulse: 67 68  Resp: 16 17  Temp: (!) 36.1 C   SpO2: 100% 97%    Last Pain:  Vitals:   05/25/22 1018  TempSrc:   PainSc: Asleep                 [name]

## 2022-05-29 NOTE — Telephone Encounter (Signed)
Patient's mother called office today saying that the patient is doing worse and if having difficulty ambulating secondary to decreased balance.  The MRI done on Friday showed small lacunar infarcts that were old but appear to be new from his MRI in October.  I am concerned given patient's increased difficulty ambulating that seems to be progressively worsening that he may need further work-up for this.  Discussed with the mother and she is agreeable to a direct admission so that we can work this up further.  We will attempt to obtain direct admission for the patient.

## 2022-05-29 NOTE — Progress Notes (Signed)
Received page from patient's primary care physician this morning regarding pending direct admission. Patient is reportedly presenting with worsening neurological deficits in setting of new MRI brain findings. IMTS to accept patient once he has arrived to floor. Please page the Internal Medicine senior resident at [phone] once patient has arrived.   Sanjuan Dame, MD Internal Medicine PGY-3 Pager: [phone]

## 2022-05-29 NOTE — Consult Note (Signed)
Neurology Consultation  Reason for Consult: gait disturbances and fall Referring Physician: Dr. Evette Doffing  CC: recent fall and gait disturbance  History is obtained from:patient, chart and mother  HPI: Nicholas Holt is a 41 y.o. male with history of suprasellar germinoma at age 66 (patient has been cared for by his mother since this time and has had persistent gait difficulties and limitation of left arm ROM), HTN, HLD and panhypopituitarism who presents with a recent fall about two weeks ago followed by increased gait instability and anxiety regarding ambulation.  Patient's mother states that he has been tow walking since he was 37 but that his gait disturbance has become worse in the last few weeks.  She reports that after his fall, patient has been afraid to ambulate as well.  Patient had a MRI on 7/13 after this fall to rule out recurrence of his tumor which revealed no masses and progressive microangiopathic changes (these were present on his previous MRI from 2022).  Patient's mother is concerned about this and is requesting an evaluation of these changes and old lacunar infarcts.   ROS: Unable to obtain due to altered mental status.   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  Complicated by left sided motor tic disorder and panhypopituitarism.    Hyperlipidemia 08/08/2006   Hypertension    Hypothyroidism    Imbalance    at risk for falls   Night sweats 02/06/2018   Obesity (BMI 30.0-34.9) 07/09/2015   Panhypopituitarism  (diabetes insipidus/anterior pituitary deficiency) ([place]) 08/08/2006   Following therapy for the suprasellar germinoma.  Includes central diabetes insipidus, secondary hypothyroidism, secondary adrenal insufficiency, and secondary hypogonadism.    Perennial allergic rhinitis 07/09/2015   Pre-diabetes    no meds   Seizure disorder ([place]) 08/08/2006   Predated brain tumor.  Generalized seizure ocurred after running out of lorazepam post-tumor.    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 Treated with alendronate from 2009-2016. DEXA (03/16/2008): L Spine T -2.0. L fem neck T -0.9, R fem neck T -1.9.  DEXA (10/15/2014): L Spine T -1.3, L fem neck T -0.6, R fem neck T -1.8.  Bisphosphonate holiday 06/2015.  Repeat DEXA scan in 10/2016 to reassess.      Family History  Problem Relation Age of Onset   Hypertension Mother    Hypertension Father    Hyperlipidemia Father    Prostate cancer Father    Diabetes type I Sister        Died of complications of diabetes in her 41's     Social History:   reports that he has never smoked. He has never used smokeless tobacco. He reports that he does not drink alcohol and does not use drugs.  Medications  Current Facility-Administered Medications:    acetaminophen (TYLENOL) tablet 650 mg, 650 mg, Oral, Q6H PRN **OR** acetaminophen (TYLENOL) suppository 650 mg, 650 mg, Rectal, Q6H PRN, Coy Saunas, Charisse March, MD   enoxaparin (LOVENOX) injection 40 mg, 40 mg, Subcutaneous, Q24H, Coy Saunas, Charisse March, MD, 40 mg at 05/29/22 1840   ondansetron (ZOFRAN)  tablet 4 mg, 4 mg, Oral, Q6H PRN **OR** ondansetron (ZOFRAN) injection 4 mg, 4 mg, Intravenous, Q6H PRN, [place], Charisse March, MD   senna-docusate (Senokot-S) tablet 1 tablet, 1 tablet, Oral, QHS PRN, Coy Saunas, Charisse March, MD   Exam: Current vital signs: BP (!) 145/86 (BP Location: Right Arm)   Pulse 91   Temp 97.9 F (36.6 C) (Oral)   Resp 18   Ht '5\' 8"'$  (1.727 m)   Wt 91.3 kg    SpO2 100%   BMI 30.60 kg/m  Vital signs in last 24 hours: Temp:  [97.9 F (36.6 C)] 97.9 F (36.6 C) (07/17 1445) Pulse Rate:  [91] 91 (07/17 1445) Resp:  [18] 18 (07/17 1445) BP: (145)/(86) 145/86 (07/17 1445) SpO2:  [100 %] 100 % (07/17 1445) Weight:  [91.3 kg] 91.3 kg (07/17 1445)  GENERAL: Awake, alert, in no acute distress Psych: Affect appropriate for situation, patient is calm and cooperative with examination Head: Normocephalic and atraumatic, without obvious abnormality EENT: Normal conjunctivae, moist mucous membranes, no OP obstruction LUNGS: Normal respiratory effort. Non-labored breathing on room air Extremities: warm, well perfused, with contracture of left arm  NEURO:  Mental Status: Awake, alert, and oriented to person, place, time, and situation. He is not able to provide a clear and coherent history of present illness. Speech/Language: speech is clear but nonfluent.   No neglect is noted Cranial Nerves:  II: PERRL visual fields full.  III, IV, VI: EOMI.  V: Sensation is intact to light touch and symmetrical to face.  VII: Face is symmetric resting and smiling.  VIII: Hearing intact to voice IX, X: Phonation normal. XII: Tongue protrudes midline without fasciculations.   Motor: 5/5 strength in RUE and BLE, 4+/5 in LUE (baseline)  Tone is normal. Bulk is normal.  Sensation: Intact to light touch bilaterally in all four extremities.  Coordination: FTN intact bilaterally. No pronator drift.  Gait: Deferred  Labs I have reviewed labs in epic and the results pertinent to this consultation are:   CBC    Component Value Date/Time   WBC 6.4 05/29/2022 1755   RBC 4.62 05/29/2022 1755   HGB 13.7 05/29/2022 1755   HGB 13.7 03/09/2020 0903   HCT 39.6 05/29/2022 1755   HCT 41.4 03/09/2020 0903   PLT 219 05/29/2022 1755   PLT 171 03/09/2020 0903   MCV 85.7 05/29/2022 1755   MCV 90 03/09/2020 0903   MCH 29.7 05/29/2022 1755   MCHC 34.6 05/29/2022 1755    RDW 13.3 05/29/2022 1755   RDW 13.5 03/09/2020 0903   LYMPHSABS 2.9 05/29/2022 1755   LYMPHSABS 3.0 08/19/2019 0934   MONOABS 0.5 05/29/2022 1755   EOSABS 0.1 05/29/2022 1755   EOSABS 0.1 08/19/2019 0934   BASOSABS 0.0 05/29/2022 1755   BASOSABS 0.0 08/19/2019 0934    CMP     Component Value Date/Time   NA 136 05/25/2022 0803   NA 139 01/11/2021 0925   K 4.2 05/25/2022 0803   CL 102 05/25/2022 0803   CO2 25 05/25/2022 0803   GLUCOSE 87 05/25/2022 0803   BUN 11 05/25/2022 0803   BUN 9 01/11/2021 0925   CREATININE 1.14 05/25/2022 0803   CREATININE 0.86 02/09/2015 1032   CALCIUM 9.2 05/25/2022 0803   CALCIUM 9.2 03/27/2008 1350   PROT 7.4 02/06/2018 1558   ALBUMIN 4.4 02/06/2018 1558   AST 21 02/06/2018 1558   ALT 25 02/06/2018 1558   ALKPHOS 68 02/06/2018 1558   BILITOT <0.2 02/06/2018 1558  GFRNONAA >60 05/25/2022 0803   GFRNONAA >89 02/09/2015 1032   GFRAA 92 03/09/2020 0903   GFRAA >89 02/09/2015 1032    Imaging I have reviewed the images obtained:  MRI examination of the brain: No acute abnormality, resection site near anterior commissure without masslike enhancement, progressive microangiopathic changes with evidence of prior hemorrhages and lacunar infarcts  Assessment: 41 year old patient with history of suprasellar germinoma, HTN, HLD and panhypopituitarism presents with increasing gait disturbances and recent fall with anxiety regarding ambulation.  Patient walks on his toes at baseline and has been more unsteady than usual over the past few weeks.  His mother would like for him to be evaluated by PT for this.  Patient has also reportedly been afraid to ambulate and has been holding on to objects to steady himself.    Impression:Increasing gait disturbance and anxiety in patient with history of suprasellar germinoma  Recommendations: - - - -  Pt seen by NP/Neuro and later by MD. Note/plan to be edited by MD as needed.  [place] , MSN,  AGACNP-BC [facility] See Amion for schedule and pager information 05/29/2022 6:48 PM

## 2022-05-29 NOTE — H&P (Signed)
Date: 05/29/2022               Patient Name:  Nicholas Holt MRN: [id number]  DOB: Jun 25, 1981 Age / Sex: 41 y.o., male   PCP: Default, Provider, MD         Medical Service: Internal Medicine Teaching Service         Attending Physician: Dr. Evette Doffing, Mallie Mussel, *    First Contact: Gaylyn Rong, MD      Pager: Dorothea Ogle [phone]      Second Contact: Linwood Dibbles, MD      Pager: PA [phone]           After Hours (After 5p/  First Contact Pager: [phone]  weekends / holidays): Second Contact Pager: [phone]   SUBJECTIVE   Chief Complaint: Worsening balance/ambulation  History of Present Illness:  [name] is a 41 year old male with past medical history of suprasellar germinoma status post chemo and radiation therapy complicated by left-sided motor tic disorder and panhypopituitarism, hypertension, hyperlipidemia, anxiety, ADD, chronic ataxia who was directly admitted for work-up of acutely worsening balance and ambulation with recent MRI showing evidence of prior hemorrhages and lacunar infarcts.  Of note, there is a telephone encounter with this patient on July 13 regarding a discussion surrounding patient's ambulation and MRI results.  At that point, it seemed like the patient's ambulation was improving with plans to obtain a leg brace later.  4 days later on July 17, the patient's mother contacted [place] reporting that the patient's ambulation had worsened in the interim.  Upon our assessment, the patient and his parents report that he has had worsening balance starting last Friday, July 14.  He started having to lean on objects to prevent from falling, reflecting an acute deterioration of his chronic ataxia.  He denies any sudden onset weakness in any of his limbs, headaches, changes in speech, vision loss, confusion, loss of consciousness, chest pain, dyspnea.  His parents confirmed that the main concern is his worsening ambulation and that they are mostly seeking prophylaxis  against future strokes.     ED Course:  Meds:  Current Meds  Medication Sig   albuterol (PROAIR HFA) 108 (90 Base) MCG/ACT inhaler Inhale 1-2 puffs into the lungs every 6 (six) hours as needed. (Patient taking differently: Inhale 1-2 puffs into the lungs every 6 (six) hours as needed for shortness of breath.)   alendronate (FOSAMAX) 70 MG tablet TAKE 1 TABLET BY MOUTH ONCE A WEEK, AS DIRECTED (Patient taking differently: Take 70 mg by mouth once a week.)   amLODipine (NORVASC) 5 MG tablet TAKE 1 TABLET(5 MG) BY MOUTH DAILY (Patient taking differently: Take 5 mg by mouth every evening.)   Ascorbic Acid (VITAMIN C) 1000 MG tablet Take 1,000 mg by mouth daily.   cyclobenzaprine (FLEXERIL) 10 MG tablet TAKE 1 TO 2 TABLETS BY MOUTH AT BEDTIME AS NEEDED FOR MUSCLE SPASMS (Patient taking differently: Take 10 mg by mouth at bedtime as needed for muscle spasms.)   desmopressin (DDAVP NASAL) 0.01 % solution USE ONE SPRAY IN EACH NOSTRIL FOUR TIMES DAILY (Patient taking differently: Place 10 mcg into the nose in the morning, at noon, in the evening, and at bedtime.)   fluticasone (FLONASE) 50 MCG/ACT nasal spray Place 1 spray into both nostrils daily. (Patient taking differently: Place 1 spray into both nostrils at bedtime as needed for allergies.)   hydrocortisone (CORTEF) 10 MG tablet TAKE 1 AND 1/2 TABLET BY MOUTH DAILY; DOUBLE THE DOSE ON SICK DAYS (Patient  taking differently: Take 5 mg by mouth in the morning.)   levothyroxine (SYNTHROID) 100 MCG tablet Take 100 mcg by mouth daily before breakfast.   LORazepam (ATIVAN) 2 MG tablet TAKE 3 TABLETS(6 MG) BY MOUTH AT BEDTIME (Patient taking differently: Take 6 mg by mouth at bedtime.)   potassium chloride (KLOR-CON) 10 MEQ tablet TAKE 2 TABLETS(20 MEQ) BY MOUTH TWICE DAILY (Patient taking differently: Take 20 mEq by mouth 2 (two) times daily.)   Testosterone (ANDROGEL PUMP) 20.25 MG/ACT (1.62%) GEL Place 20.25 mg onto the skin daily. Apply 6 pumps 3 pumps  per side) topically to shoulders or upper back once a day (Patient taking differently: Place 6 Pump onto the skin at bedtime. Apply 6 pumps 3 pumps per side) topically to shoulders or upper back once a day)    Past Medical History  Past Surgical History:  Procedure Laterality Date   BRAIN BIOPSY  11/14/1995   CSF SHUNT  11/14/1995   PORTACATH PLACEMENT     portacath removal     RADIOLOGY WITH ANESTHESIA N/A 08/30/2021   Procedure: MRI WITH ANESTHESIA BRAIN WITH AND WITHOUT CONTRAST;  Surgeon: Radiologist, Medication, MD;  Location: [facility];  Service: Radiology;  Laterality: N/A;   RADIOLOGY WITH ANESTHESIA N/A 05/25/2022   Procedure: MRI WITH ANESTHESIA OF BRAIN WITH AND WITHOUT CONTRAST;  Surgeon: Radiologist, Medication, MD;  Location: [facility];  Service: Radiology;  Laterality: N/A;    Social:  Lives With: His parents Support: Family support present Level of Function: Requires support for most ADLs and IADLs PCP: [facility] Substances: Denies tobacco, alcohol, cocaine, or other substance use.  Family History: His mother states that her brother had a stroke and passed away from dementia.  No other family history of stroke.  They denied family history of clotting or bleeding disorders including sickle cell.  No other notable cardiac history or diabetes.  Both parents have hypertension, father has hyperlipidemia.  Allergies: Allergies as of 05/29/2022 - Review Complete 05/29/2022  Allergen Reaction Noted   Codeine Other (See Comments) 12/08/2021    Review of Systems: A complete ROS was negative except as per HPI.   OBJECTIVE:   Physical Exam: Blood pressure (!) 145/86, pulse 91, temperature 97.9 F (36.6 C), temperature source Oral, resp. rate 18, height '5\' 8"'$  (1.727 m), weight 91.3 kg, SpO2 100 %.  Constitutional: well-appearing male sitting in chair, in no acute distress.  Pleasant and answers questions appropriately Cardiovascular: 1 out of 6 systolic murmur heard loudest on the right  upper sternal border.  Regular rate rhythm. Pulmonary/Chest: normal work of breathing on room air, lungs clear to auscultation bilaterally Abdominal: soft, non-tender, non-distended Neuro: Left upper extremity contracted chronically.  Cranial nerves II through XII intact.  5 out of 5 strength in upper extremities.  4 out of 5 strength in flexion of the knee bilaterally, 5 out of 5 strength otherwise in bilateral lower extremities.  Rapid alternating movements equal bilaterally in upper extremities.  Heel-to-shin test deficient in left lower extremity.  Deficits in finger-nose test worse in left upper extremity compared to right.  Patient with unsteady gait, uses upper extremities to raise from sitting to standing, walks while plantarflexed.  Labs: CBC    Latest Ref Rng & Units 05/29/2022    5:55 PM 05/25/2022    8:03 AM 03/09/2020    9:03 AM 08/19/2019    9:34 AM 02/06/2018    3:58 PM 02/09/2015   10:32 AM 05/20/2014   12:00 AM  CBC  WBC 3.4 - 10.8 x10E3/uL   5.7  6.3  6.1   8.6      Hb 13.0 - 17.0 g/dL 13.7  13.2     13.7  13.3      Hct 39.0 - 52.0 % 39.6  38.7     41.8    MCV 80.0 - 100.0 fL 85.7  87.6     89.7    RDW 11.6 - 15.4 %   13.5  13.6  14.2     Plts 150 - 400 K/uL 219  204     186  172         This result is from an external source.     CMP     Latest Ref Rng & Units 05/29/2022    5:55 PM 05/25/2022    8:03 AM 08/30/2021    5:51 AM 01/11/2021    9:25 AM 03/09/2020    9:03 AM 08/19/2019    9:34 AM 02/06/2018    3:58 PM  CMP  Glucose 70 - 99 mg/dL 95  87  92  84  81  84  89   BUN 6 - 20 mg/dL '12  11  10  9  14  11  11   '$ Creatinine 0.61 - 1.24 mg/dL 1.01  1.14  1.17  1.36  1.15  1.11  1.04   Sodium 135 - 145 mmol/L 128  136  139  139  140  136  140   Potassium 3.5 - 5.1 mmol/L 3.7  4.2  3.5  4.5  4.5  3.7  4.4   Chloride 98 - 111 mmol/L 93  102  106  99  104  98  103   CO2 22 - 32 mmol/L '25  25  27  22  25  24  22   '$ Calcium 8.9 - 10.3 mg/dL 8.8  9.2  9.0  9.2  9.4  9.7  10.0    Total Protein 6.5 - 8.1 g/dL 7.7       7.4   Total Bilirubin 0.3 - 1.2 mg/dL 0.4       <0.2   Alkaline Phos 38 - 126 U/L 53       68   AST 15 - 41 U/L 22       21   ALT 0 - 44 U/L 25       25    A1c 5.8, CK 474, mag 21, Phos 3.8, PT 12.5, INR 0.9, PTT 35  Imaging: MRI head with without contrast (05/25/2022): No evidence of acute intracranial abnormality, resection site similar with empty sella, chronic changes with evidence of hemorrhages and lacunar infarcts.   ASSESSMENT & PLAN:   Assessment & Plan by Problem: Principal Problem:   Ataxia   YONI LOBOS is a 41 y.o. person living with a history of suprasellar germinoma status post chemo and radiation therapy complicated by left-sided motor tic disorder and panhypopituitarism, hypertension, hyperlipidemia, osteopenia secondary to chronic steroid use, anxiety, ADD, and chronic ataxia who presented with worsening ambulation/balance with chronic lacunar/cerebellar infarcts and admitted for worsening ataxia/stroke work-up on hospital day 0  #Worsening ataxia #Chronic lacunar infarcts #Suprasellar germinoma status post chemo/radiation therapy with associated left-sided motor tic disorder and panhypopituitarism Patient presents with acute worsening of his chronic ataxia with associated recent finding of chronic hemorrhages/infarcts in his bilateral basal ganglia and cerebellum on MRI.  Upon assessment today, he has unsteady gait and consistently walks while plantarflexed.  His  parents state that this is significantly worse than his prior baseline.  Of note, , His CK his CK at this admission was 474 which is consistent with prior values and muscle biopsies were deferred at those times, we can discuss this with neurology. Neurology consulted. We will complete initial stroke work-up and continue his home medications for panhypopituitarism. - Follow-up lipid panel, TSH/T4 - Follow-up testosterone - Follow-up echo - Continue home desmopressin,  hydrocortisone, levothyroxine, testosterone - Continue home Flexeril 10 mg as needed for contractures - Neurology consulted, appreciate recs  #Hyponatremia Patient presents with sodium of 128, down from 136 4 days ago and 136-139 at baseline.  He was euvolemic on exam and of note takes desmopressin for diabetes insipidus secondary to panhypopituitarism following chemo and radiation for pituitary germinoma.  Additionally he is on chronic hydrocortisone for for secondary adrenal insufficiency and is also on potassium supplementation at home.  - Serum and urine Osm and urine sodium.  #Anxiety Continue home Ativan 6 mg at bedtime  #Chronic osteopenia secondary to steroid use Continue home alendronate 70 mg and vitamin D  #Hypertension Blood pressure 145/86.  Given patient does not have an acute infarct we will continue to control his blood pressure with his home regimen. - Continue home amlodipine 5 mg daily  #Chronic allergic rhinitis Continue home albuterol and Flonase as needed    Diet: Normal VTE: Enoxaparin IVF: None Code: Full  Prior to Admission Living Arrangement: Home, living with parents Anticipated Discharge Location:  Stroke work-up and PT/OT evaluation Barriers to Discharge: Pending PT and OT evaluation  Dispo: Admit patient to Observation with expected length of stay less than 2 midnights.  Signed: Linus Galas, MD Internal Medicine Resident PGY-1  05/29/2022, 7:09 PM

## 2022-05-29 NOTE — Telephone Encounter (Signed)
Call to patient's mother informed her that the patient will be direct admission to the hospital and to await a call from Admitting.  Mother voiced understanding of the plan.  Asked if patient will need to stay overnight -informed mother that patient will be admitted to the hospital and will stay overnight when he comes in.Nicholas Holt

## 2022-05-30 ENCOUNTER — Observation Stay (HOSPITAL_BASED_OUTPATIENT_CLINIC_OR_DEPARTMENT_OTHER): Payer: Medicare Other

## 2022-05-30 ENCOUNTER — Observation Stay (HOSPITAL_COMMUNITY): Payer: Medicare Other

## 2022-05-30 DIAGNOSIS — R260 Ataxic gait: Secondary | ICD-10-CM

## 2022-05-30 DIAGNOSIS — E871 Hypo-osmolality and hyponatremia: Secondary | ICD-10-CM

## 2022-05-30 DIAGNOSIS — I6381 Other cerebral infarction due to occlusion or stenosis of small artery: Secondary | ICD-10-CM | POA: Diagnosis not present

## 2022-05-30 DIAGNOSIS — Z79899 Other long term (current) drug therapy: Secondary | ICD-10-CM | POA: Diagnosis not present

## 2022-05-30 DIAGNOSIS — I10 Essential (primary) hypertension: Secondary | ICD-10-CM | POA: Diagnosis not present

## 2022-05-30 DIAGNOSIS — R270 Ataxia, unspecified: Secondary | ICD-10-CM | POA: Diagnosis not present

## 2022-05-30 DIAGNOSIS — Z85841 Personal history of malignant neoplasm of brain: Secondary | ICD-10-CM | POA: Diagnosis not present

## 2022-05-30 HISTORY — DX: Hypo-osmolality and hyponatremia: E87.1

## 2022-05-30 LAB — OSMOLALITY: Osmolality: 268 mOsm/kg — ABNORMAL LOW (ref 275–295)

## 2022-05-30 LAB — BASIC METABOLIC PANEL
Anion gap: 9 (ref 5–15)
BUN: 12 mg/dL (ref 6–20)
CO2: 25 mmol/L (ref 22–32)
Calcium: 8.9 mg/dL (ref 8.9–10.3)
Chloride: 93 mmol/L — ABNORMAL LOW (ref 98–111)
Creatinine, Ser: 0.92 mg/dL (ref 0.61–1.24)
GFR, Estimated: >60 mL/min (ref >60)
Glucose, Bld: 83 mg/dL (ref 70–99)
Potassium: 3.4 mmol/L — ABNORMAL LOW (ref 3.5–5.1)
Sodium: 127 mmol/L — ABNORMAL LOW (ref 135–145)

## 2022-05-30 LAB — OSMOLALITY, URINE: Osmolality, Ur: 532 mOsm/kg (ref 300–900)

## 2022-05-30 LAB — CBC
HCT: 37.1 % — ABNORMAL LOW (ref 39.0–52.0)
Hemoglobin: 13.1 g/dL (ref 13.0–17.0)
MCH: 30.1 pg (ref 26.0–34.0)
MCHC: 35.3 g/dL (ref 30.0–36.0)
MCV: 85.3 fL (ref 80.0–100.0)
Platelets: 206 10*3/uL (ref 150–400)
RBC: 4.35 MIL/uL (ref 4.22–5.81)
RDW: 13.1 % (ref 11.5–15.5)
WBC: 5.7 10*3/uL (ref 4.0–10.5)
nRBC: 0 % (ref 0.0–0.2)

## 2022-05-30 LAB — LIPID PANEL
Cholesterol: 194 mg/dL (ref 0–200)
HDL: 57 mg/dL (ref >40)
LDL Cholesterol: 121 mg/dL — ABNORMAL HIGH (ref 0–99)
Total CHOL/HDL Ratio: 3.4 RATIO
Triglycerides: 81 mg/dL (ref <150)
VLDL: 16 mg/dL (ref 0–40)

## 2022-05-30 LAB — TESTOSTERONE: Testosterone: 504 ng/dL (ref 264–916)

## 2022-05-30 LAB — SODIUM, URINE, RANDOM: Sodium, Ur: 177 mmol/L

## 2022-05-30 LAB — ECHOCARDIOGRAM COMPLETE
Area-P 1/2: 2.88 cm2
S' Lateral: 2.3 cm

## 2022-05-30 MED ORDER — ATORVASTATIN CALCIUM 40 MG PO TABS: 40.0000 mg | ORAL_TABLET | Freq: Every day | ORAL | 5 refills | Status: DC

## 2022-05-30 MED ORDER — CYCLOBENZAPRINE HCL 10 MG PO TABS
10.0000 mg | ORAL_TABLET | Freq: Every day | ORAL | Status: DC | PRN
  Administered 2022-05-30: 10 mg via ORAL
  Filled 2022-05-30: qty 1

## 2022-05-30 MED ORDER — LOSARTAN POTASSIUM 25 MG PO TABS: 25.0000 mg | ORAL_TABLET | Freq: Every day | ORAL | 5 refills | Status: DC

## 2022-05-30 MED ORDER — LOSARTAN POTASSIUM 25 MG PO TABS
25.0000 mg | ORAL_TABLET | Freq: Every day | ORAL | Status: DC
  Administered 2022-05-30: 25 mg via ORAL
  Filled 2022-05-30: qty 1

## 2022-05-30 MED ORDER — CYCLOBENZAPRINE HCL 10 MG PO TABS
10.0000 mg | ORAL_TABLET | Freq: Three times a day (TID) | ORAL | Status: DC | PRN
  Administered 2022-05-30: 10 mg via ORAL
  Filled 2022-05-30 (×2): qty 1

## 2022-05-30 MED ORDER — ATORVASTATIN CALCIUM 40 MG PO TABS
40.0000 mg | ORAL_TABLET | Freq: Every day | ORAL | Status: DC
  Filled 2022-05-30: qty 1

## 2022-05-30 MED ORDER — ASPIRIN 81 MG PO CHEW
81.0000 mg | CHEWABLE_TABLET | Freq: Every day | ORAL | Status: DC
  Administered 2022-05-30: 81 mg via ORAL
  Filled 2022-05-30: qty 1

## 2022-05-30 MED ORDER — ASPIRIN 81 MG PO CHEW: 81.0000 mg | CHEWABLE_TABLET | Freq: Every day | ORAL | 5 refills | Status: DC

## 2022-05-30 NOTE — Progress Notes (Signed)
PT AVS reviewed and pt verbalized understanding of all DC teaching and instructions. PT has all pt belongings in pt possession and will be leaving with family as transportation.

## 2022-05-30 NOTE — Evaluation (Signed)
Occupational Therapy Evaluation Patient Details Name: Nicholas Holt MRN: [id number] DOB: 02/23/1981 Today's Date: 05/30/2022   History of Present Illness 41 year old male direct admit for work-up of acutely worsening balance and ambulation with rcent MRI showeing evidence of prior hemorrhages and lacunar infarcts PMH: suprasellar germinoma status post chemo and radiation therapy complicated by left-sided motor tic disorder and panhypopituitarism, hypertension, hyperlipidemia, anxiety, ADD, chronic ataxia.   Clinical Impression      Pt currently at min guard assist for transfers to the toilet and for the simulated tub edge.  Now with slightly more impaired balance and mobility compared to baseline per family report.  Feel pt will benefit from acute OT to help recommend appropriate safe DME for use at home in the bathroom as well as to help increase balance for safe completion of ADL tasks.     Recommendations for follow up therapy are one component of a multi-disciplinary discharge planning process, led by the attending physician.  Recommendations may be updated based on patient status, additional functional criteria and insurance authorization.   Follow Up Recommendations  No OT follow up    Assistance Recommended at Discharge PRN  Patient can return home with the following A little help with bathing/dressing/bathroom;Assistance with cooking/housework;Assist for transportation;Direct supervision/assist for medications management;A little help with walking and/or transfers    Functional Status Assessment  Patient has had a recent decline in their functional status and demonstrates the ability to make significant improvements in function in a reasonable and predictable amount of time.  Equipment Recommendations  Tub/shower bench;Other (comment) (If insurance will cover.  Family reports pt has medicaid but chart states medicare)       Precautions / Restrictions Precautions Precautions:  Fall Precaution Comments: hx of LUE and LLE hemiparesis Restrictions Weight Bearing Restrictions: No      Mobility Bed Mobility                    Transfers Overall transfer level: Needs assistance   Transfers: Sit to/from Stand, Bed to chair/wheelchair/BSC Sit to Stand: Min guard     Step pivot transfers: Min guard     General transfer comment: Pt needing some UE support with mobility and transfers over simulated tub and from the toilet secondadry to not feeling as secure as his normal baseline.      Balance Overall balance assessment: Needs assistance Sitting-balance support: Feet unsupported Sitting balance-Leahy Scale: Fair   Postural control: Posterior lean Standing balance support: During functional activity Standing balance-Leahy Scale: Poor Standing balance comment: Pt needs UE support during transfers and mobility this session                           ADL either performed or assessed with clinical judgement   ADL Overall ADL's : Needs assistance/impaired Eating/Feeding: Modified independent;Sitting   Grooming: Wash/dry hands;Wash/dry face;Min guard;Standing Grooming Details (indicate cue type and reason): simulated Upper Body Bathing: Set up;Sitting Upper Body Bathing Details (indicate cue type and reason): simulated Lower Body Bathing: Minimal assistance;Sit to/from stand Lower Body Bathing Details (indicate cue type and reason): simulated Upper Body Dressing : Set up;Sitting Upper Body Dressing Details (indicate cue type and reason): simulated Lower Body Dressing: Sit to/from stand;Min guard   Toilet Transfer: Min guard;Ambulation   Toileting- Clothing Manipulation and Hygiene: Min guard;Sit to/from stand   Tub/ Shower Transfer: Tub transfer;Minimal assistance;Ambulation Tub/Shower Transfer Details (indicate cue type and reason): simulated stepping over the edge of the  tub Functional mobility during ADLs: Min guard General ADL  Comments: Pt and family reporting greater difficulty with balance and mobility this admission.  Discussed need for a tub seat/bench in the shower to assist with transfer.  Feel tub bench would be more beneficial for safety at home to assist with transferring in and out of the shower.  Pt and family in agreement.  Attempted use of the RW for mobility, however secondary to increased left wrist flexion contracture it did not seem to be as beneficial.  Will discuss with PT to help determine best assistive device for mobility.     Vision Baseline Vision/History: 1 Wears glasses (for close up reading) Ability to See in Adequate Light: 0 Adequate Patient Visual Report: No change from baseline Vision Assessment?: No apparent visual deficits            Pertinent Vitals/Pain Pain Assessment Pain Assessment: No/denies pain     Hand Dominance Right   Extremity/Trunk Assessment Upper Extremity Assessment Upper Extremity Assessment: LUE deficits/detail LUE Deficits / Details: Pt with history of LUE hemiparesis.  Uses functionally at an a diminshed level with self care tasks.  Shoulder flexion 0-130 degree synergy pattern.  Isolated movement at the elbow.  Increased wrist flexion tone noted at 4/4 on the Modified [place] Scale.  He demonstrates gross digit flexion and extension as well as ability to complete opposition of thumb to all digits. LUE Sensation: WNL LUE Coordination: decreased gross motor   Lower Extremity Assessment Lower Extremity Assessment: Defer to PT evaluation       Communication Communication Communication: Expressive difficulties (difficult to understand when he gets excited)   Cognition Arousal/Alertness: Awake/alert Behavior During Therapy: WFL for tasks assessed/performed Overall Cognitive Status: History of cognitive impairments - at baseline                                 General Comments: Pt needed mod instructional cueing for slowing down during  mobility.                Home Living Family/patient expects to be discharged to:: Private residence Living Arrangements: Parent Available Help at Discharge: Family;Available 24 hours/day Type of Home: House Home Access: Stairs to enter [employer] of Steps: 4 Entrance Stairs-Rails: Right Home Layout: Multi-level Alternate Level Stairs-Number of Steps: 15 Alternate Level Stairs-Rails: Right Bathroom Shower/Tub: Teacher, early years/pre: Standard Bathroom Accessibility: Yes   Home Equipment: Grab bars - tub/shower;Hand held shower head          Prior Functioning/Environment Prior Level of Function : Needs assist             Mobility Comments: ambulated independently without AD, avid bowler ADLs Comments: father assists with bathing, able to dress most of the time, parents provide for iADLs        OT Problem List: Impaired balance (sitting and/or standing);Decreased range of motion;Decreased strength;Decreased knowledge of use of DME or AE;Pain      OT Treatment/Interventions: Self-care/ADL training;Therapeutic exercise;DME and/or AE instruction;Therapeutic activities;Balance training;Patient/family education;Neuromuscular education    OT Goals(Current goals can be found in the care plan section) Acute Rehab OT Goals Patient Stated Goal: Pt did not state, but agreeable to working in therapy. OT Goal Formulation: With patient Time For Goal Achievement: 06/13/22 Potential to Achieve Goals: Good  OT Frequency: Min 2X/week       AM-PAC OT "6 Clicks" Daily Activity  Outcome Measure Help from another person eating meals?: None Help from another person taking care of personal grooming?: A Little Help from another person toileting, which includes using toliet, bedpan, or urinal?: A Little Help from another person bathing (including washing, rinsing, drying)?: A Little Help from another person to put on and taking off regular upper body  clothing?: None Help from another person to put on and taking off regular lower body clothing?: A Little 6 Click Score: 20   End of Session Equipment Utilized During Treatment: Gait belt  Activity Tolerance: Patient tolerated treatment well Patient left: in bed;with call bell/phone within reach  OT Visit Diagnosis: Unsteadiness on feet (R26.81);Muscle weakness (generalized) (M62.81);Repeated falls (R29.6)                Time: 9935-7017 OT Time Calculation (min): 38 min Charges:  OT General Charges $OT Visit: 1 Visit OT Evaluation $OT Eval Moderate Complexity: 1 Mod OT Treatments $Self Care/Home Management : 23-37 mins Markevion Lattin,[place] OTR/L 05/30/2022, 12:43 PM

## 2022-05-30 NOTE — Progress Notes (Signed)
Neurology Progress Note, same day, no charge  Patient ID: Nicholas Holt is a 41 y.o. male with history of suprasellar germinoma at age 53 (patient has been cared for by his mother since this time and has had persistent gait difficulties and limitation of left arm ROM), HTN, HLD and panhypopituitarism who presents with a recent fall about two weeks ago followed by increased gait instability and anxiety regarding ambulation.  Patient's mother states that he has been having trouble with walking since he was 15 but that his gait disturbance has become worse in the last few weeks.  She reports that after his fall, patient has been afraid to ambulate as well.  Patient had a MRI on 7/13 after this fall to rule out recurrence of his tumor which revealed no masses and progressive microangiopathic changes (these were present on his previous MRI from 2022 but seem to have progressed since).  Patient's mother is concerned about this and is requesting an evaluation of these changes and lacunar infarcts.    Subjective: Per primary team, mother eager to be discharged due to patient's restlessness and did not want to wait for further evaluation or full review of the work-up requested by Dr. Lorrin Goodell   Exam: Current vital signs: BP 129/84 (BP Location: Right Arm)   Pulse 71   Temp 98.2 F (36.8 C)   Resp 17   Ht '5\' 8"'$  (1.727 m)   Wt 91.3 kg   SpO2 99%   BMI 30.60 kg/m  Vital signs in last 24 hours: Temp:  [97.7 F (36.5 C)-99 F (37.2 C)] 98.2 F (36.8 C) (07/18 1027) Pulse Rate:  [70-91] 71 (07/18 1027) Resp:  [17-18] 17 (07/18 1027) BP: (122-145)/(77-87) 129/84 (07/18 1027) SpO2:  [99 %-100 %] 99 % (07/18 1027) Weight:  [91.3 kg] 91.3 kg (07/17 1445)   Discharge prior to my examination  Pertinent Labs:  Lab Results  Component Value Date   CHOL 194 05/30/2022   HDL 57 05/30/2022   LDLCALC 121 (H) 05/30/2022   TRIG 81 05/30/2022   CHOLHDL 3.4 05/30/2022   Lab Results  Component Value  Date   HGBA1C 5.8 (H) 05/29/2022    Impression: Weakness secondary to lacunar strokes and acute hyponatremia.  Please note that his hyponatremia developed between 7/13 and 7/17 and therefore may be contributing to his worsening gait symptoms; discussed with primary team via phone of the patient had already been discharged at that time  Recommendations:  # Remote lacunar strokes - ASA 81 mg daily for secondary stroke prevention - Agree with statin for goal LDL less than 70  # Chronic CK elevation - Given African-American race, patient's CK is within normal limits for race  # Hyponatremia - May be contributing to his symptoms - Appreciate workup and management per primary team, and they note he is planned for close outpatient follow-up given mother's desire to leave  Lesleigh Noe MD-PhD [facility] [phone]

## 2022-05-30 NOTE — TOC Transition Note (Signed)
Transition of Care [facility]) - CM/SW Discharge Note   Patient Details  Name: Nicholas Holt MRN: [id number] Date of Birth: [birth year]  [facility] of [facility]) CM/SW Contact:  Tom-Johnson, Renea Ee, RN Phone Number: 05/30/2022, 5:11 PM   Clinical Narrative:      Patientnis scheduled for discharge today. Admitted for Ataxia. MRI showed progressive microangiopathic changes with evidence of prior hemorrhages and lacunar infarcts in the basal ganglia and cerebellum. Patient has hx of Brain Cancer s/p Chemo and Radiation. Has Left side deficit with contracture to left arm. From home with his parents who are his primary caregivers. Does not have DME's at home. OT recommended Tub bench,ordered from [place] and [facility] to deliver at patient's residence. Outpatient PT order placed, info on AVS. PCP is Aldine Contes, MD and uses [facility] on [address].  Family to transport at discharge. No further TOC needs noted.  Final next level of care: OP Rehab Barriers to Discharge: Barriers Resolved   Patient Goals and CMS Choice Patient states their goals for this hospitalization and ongoing recovery are:: To return home with parents CMS Medicare.gov Compare Post Acute Care list provided to:: Patient Choice offered to / list presented to : Patient, Parent (Both parents)  Discharge Placement                Patient to be transferred to facility by: Family      Discharge Plan and Services   Discharge Planning Services: CM Consult Post Acute Care Choice:  (Outpatient PT- Neuro)          DME Arranged: Tub bench DME Agency: [employer] Date DME Agency Contacted: 05/30/22 Time DME Agency Contacted: [id number] Representative spoke with at DME Agency: [facility] HH Arranged: NA [place] Agency: NA        Social Determinants of Health (SDOH) Interventions     Readmission Risk Interventions     No data to display

## 2022-05-30 NOTE — Progress Notes (Signed)
Mobility Specialist Criteria Algorithm Info.   05/30/22 1612  Mobility  Activity Refused mobility   Patient in bedside procedure. Will f/u as time permits.  05/30/2022 4:12 PM  Martinique Quincey [place], [place], [place]  FXJOI:[phone] Office: [phone]

## 2022-05-30 NOTE — Progress Notes (Signed)
Carotid artery duplex has been completed. Preliminary results can be found in CV Proc through chart review.   05/30/22 4:39 PM Nicholas Holt RVT

## 2022-05-30 NOTE — Discharge Summary (Signed)
Name: Nicholas Holt MRN: [id number] DOB: 04-25-1981 41 y.o. PCP: Default, Provider, MD  Date of Admission: 05/29/2022  2:34 PM Date of Discharge:   05/30/2022 Attending Physician: Dr. Evette Doffing  Discharge Diagnosis: Worsening of chronic ataxia Chronic lacunar infarcts Hyponatremia likely secondary to chronic steroid use and adrenal insufficiency Suprasellar germinoma status post chemo/radiation therapy Left-sided motor tics Panhypopituitarism  Discharge Medications: Allergies as of 05/30/2022       Reactions   Codeine Other (See Comments)   Shaking         Medication List     TAKE these medications    albuterol 108 (90 Base) MCG/ACT inhaler Commonly known as: ProAir HFA Inhale 1-2 puffs into the lungs every 6 (six) hours as needed. What changed: reasons to take this   alendronate 70 MG tablet Commonly known as: FOSAMAX TAKE 1 TABLET BY MOUTH ONCE A WEEK, AS DIRECTED What changed: See the new instructions.   amLODipine 5 MG tablet Commonly known as: NORVASC TAKE 1 TABLET(5 MG) BY MOUTH DAILY What changed: See the new instructions.   aspirin 81 MG chewable tablet Chew 1 tablet (81 mg total) by mouth daily.   atorvastatin 40 MG tablet Commonly known as: LIPITOR Take 1 tablet (40 mg total) by mouth daily.   Blood Pressure Monitor Kit Please take your blood pressure daily in the morning   cyclobenzaprine 10 MG tablet Commonly known as: FLEXERIL TAKE 1 TO 2 TABLETS BY MOUTH AT BEDTIME AS NEEDED FOR MUSCLE SPASMS What changed:  how much to take how to take this when to take this reasons to take this additional instructions   desmopressin 0.01 % solution Commonly known as: DDAVP NASAL USE ONE SPRAY IN EACH NOSTRIL FOUR TIMES DAILY What changed: See the new instructions.   fluticasone 50 MCG/ACT nasal spray Commonly known as: FLONASE Place 1 spray into both nostrils daily. What changed:  when to take this reasons to take this   hydrocortisone 10  MG tablet Commonly known as: CORTEF TAKE 1 AND 1/2 TABLET BY MOUTH DAILY; DOUBLE THE DOSE ON SICK DAYS What changed:  how much to take how to take this when to take this additional instructions   levothyroxine 100 MCG tablet Commonly known as: SYNTHROID Take 100 mcg by mouth daily before breakfast.   LORazepam 2 MG tablet Commonly known as: ATIVAN TAKE 3 TABLETS(6 MG) BY MOUTH AT BEDTIME What changed: See the new instructions.   losartan 25 MG tablet Commonly known as: COZAAR Take 1 tablet (25 mg total) by mouth daily.   potassium chloride 10 MEQ tablet Commonly known as: KLOR-CON TAKE 2 TABLETS(20 MEQ) BY MOUTH TWICE DAILY What changed: See the new instructions.   Testosterone 20.25 MG/ACT (1.62%) Gel Commonly known as: AndroGel Pump Place 20.25 mg onto the skin daily. Apply 6 pumps 3 pumps per side) topically to shoulders or upper back once a day What changed:  how much to take when to take this   vitamin C 1000 MG tablet Take 1,000 mg by mouth daily.        Disposition and follow-up:   Nicholas Holt was discharged from [facility] in Good condition.  At the hospital follow up visit please address:  1.  Follow-up:  a.  Please assess him for new neurological deficits and follow-up on inpatient stroke work-up and recommendations.  Of note, we started Lipitor 40 and aspirin 81 as well as blood pressure regimen as below for him.  b.  Please reevaluate his hyponatremia and titrate his medication regimen as appropriate    C.  We started losartan 25 mg on discharge in addition to his prior amlodipine 5 mg daily.  Please reevaluate his blood pressure and titrate as necessary.  2.  Labs / imaging needed at time of follow-up: BMP  3.  Pending labs/ test needing follow-up: Carotid ultrasound  Follow-up Appointments:  Follow-up Information     [facility] Follow up.   Specialty: Rehabilitation Why:  Call to schedule first appointment visit. Contact information: [address] [place] [id number] [place] [address] [place], [place], DO. Go on 06/02/2022.   Specialty: Internal Medicine Why: Please see Dr. Alfonse Spruce in the IM clinic at [facility] on Friday, July 21 at 9:45 AM Contact information: [place] [address] [phone]                [facility] Friday, July 21 at [place] by problem list: Nicholas Holt is a 41 y.o. male with a history of suprasellar germinoma status post chemo and radiation therapy complicated by left-sided motor tic disorder and panhypopituitarism, hypertension, hyperlipidemia, osteopenia secondary to chronic steroid use, anxiety, ADD, and chronic ataxia who presented with worsening ambulation/balance with chronic lacunar/cerebellar infarcts and microhemorrhages and admitted for worsening ataxia/stroke work-up.  #Worsening ataxia #Chronic lacunar infarcts Patient presents with acute worsening of his chronic ataxia with associated recent finding of chronic hemorrhages/infarcts in his bilateral basal ganglia and cerebellum on MRI. Upon initial assessment, he had unsteady gait with foot drop and consistently walks while plantarflexed.  His parents confirmed that his gait was worse than his baseline.  Pertinent findings from the admission include: A1c 5.8, TSH 0.138 (slightly elevated compared to prior values in this patient on chronic thyroid supplements), LDL 121. Neurology consulted and assisted with recommendations.  Echo was reassuring. Repeat MRI deferred given patient had recent MRI on July 13 and is microhemorrhages are chronic in nature. CTA unable to be obtained because patient did not have peripheral veins sufficient for power injecting, opted for carotid Doppler which was normal showing 1-39% stenosis. Bilateral vertebral arteries demonstrate anterograde flow. Overall, his lower extremity  weakness and lack of coordination are likely due to combination of deconditioning, pain/contractures, and worsening of his chronic ataxia likely due to chronic lacunar infarcts.  He was started on aspirin 81, Lipitor 40, and we will change his hypertension regimen as below.  #Hyponatremia #Suprasellar germinoma status post chemo/radiation therapy with associated left-sided motor tic disorder and panhypopituitarism Testosterone was within normal limits and TSH is slightly elevated compared to prior values.  His home supplementation was continued while he was admitted.  Of note, at presentation his sodium was 128 and 127 the next morning.  Subsequent work-up showed decreased serum osmolality at 268, increased urine osmolality at 532, and increased urine sodium of 177.  His hyponatremia is likely due to chronic adrenal insufficiency given his his degree of hypopituitarism and supplemental hydrocortisone.  The patient is currently asymptomatic.  We encouraged free water restriction and schedule close follow-up for monitoring and outpatient titration.  #Hypertension Blood pressure 553Z to 482L systolic while admitted.  His chronic lacunar strokes are likely predominantly due to uncontrolled blood pressure. Continued his home amlodipine 5 mg and additionally started him on losartan 25 mg daily for better control of his blood pressure, with outpatient titration pending.    #Anxiety Continued home  Ativan 6 mg at bedtime   #Chronic osteopenia secondary to steroid use Continued home alendronate 70 mg and vitamin D  Discharge subjective: Patient denying any new symptoms today.  We discussed the new medications that we will be starting following this admission and he was amenable to taking them daily.  Family was requesting discharge today given that they primarily wanted to get his stroke work-up done and return home because the patient is anxious in the hospital.  The patient has stable symptoms and so we  planned for discharge in the afternoon  Discharge Vitals:   BP 129/84 (BP Location: Right Arm)   Pulse 71   Temp 98.2 F (36.8 C)   Resp 17   Ht $R'5\' 8"'Dq$  (1.727 m)   Wt 91.3 kg   SpO2 99%   BMI 30.60 kg/m  Exam: Of note, exam today limited because patient was having muscle cramps.  Neurological exam features largely similar to yesterday. Constitutional: well-appearing male sitting in chair, in no acute distress.  Pleasant and answers questions appropriately Neurological: Left upper extremity contracted chronically.  Patient continues to have unsteady gait with left foot drop.  Walks while plantarflexed.  Uses upper extremities to push self up from sitting to standing and to steady himself when walking around  Pertinent Labs, Studies, and Procedures:     Latest Ref Rng & Units 05/30/2022    5:57 AM 05/29/2022    5:55 PM 05/25/2022    8:03 AM  CBC  WBC 4.0 - 10.5 K/uL 5.7  6.4  4.7   Hemoglobin 13.0 - 17.0 g/dL 13.1  13.7  13.2   Hematocrit 39.0 - 52.0 % 37.1  39.6  38.7   Platelets 150 - 400 K/uL 206  219  204        Latest Ref Rng & Units 05/30/2022    5:57 AM 05/29/2022    5:55 PM 05/25/2022    8:03 AM  CMP  Glucose 70 - 99 mg/dL 83  95  87   BUN 6 - 20 mg/dL $Remove'12  12  11   'HEYGKdU$ Creatinine 0.61 - 1.24 mg/dL 0.92  1.01  1.14   Sodium 135 - 145 mmol/L 127  128  136   Potassium 3.5 - 5.1 mmol/L 3.4  3.7  4.2   Chloride 98 - 111 mmol/L 93  93  102   CO2 22 - 32 mmol/L $RemoveB'25  25  25   'LhxrzcIM$ Calcium 8.9 - 10.3 mg/dL 8.9  8.8  9.2   Total Protein 6.5 - 8.1 g/dL  7.7    Total Bilirubin 0.3 - 1.2 mg/dL  0.4    Alkaline Phos 38 - 126 U/L  53    AST 15 - 41 U/L  22    ALT 0 - 44 U/L  25     A1c 5.8, CK 474, LDL 121, TSH 0.138 Serum osmolality 268, urine osmolality 532, urine sodium 177  ECHOCARDIOGRAM COMPLETE  Result Date: 05/30/2022    ECHOCARDIOGRAM REPORT   Patient Name:   WILBUR OAKLAND Date of Exam: 05/30/2022 Medical Rec #:  [id number]        Height:       68.0 in Accession #:     [id number]       Weight:       201.3 lb Date of Birth:  04-01-1981        BSA:          2.049 m Patient Age:    54  years         BP:           129/84 mmHg Patient Gender: M                HR:           82 bpm. Exam Location:  Inpatient Procedure: 2D Echo, Cardiac Doppler and Color Doppler Indications:    Stroke.  History:        Patient has no prior history of Echocardiogram examinations.                 Risk Factors:Diabetes, Hypertension and Dyslipidemia.  Sonographer:    Danne Baxter RDCS, FE, PE Referring Phys: [id number] [place]  1. Left ventricular ejection fraction, by estimation, is 60 to 65%. The left ventricle has normal function. The left ventricle has no regional wall motion abnormalities. Left ventricular diastolic function could not be evaluated.  2. Right ventricular systolic function is normal. The right ventricular size is normal. There is normal pulmonary artery systolic pressure. The estimated right ventricular systolic pressure is 12.2 mmHg.  3. The mitral valve is grossly normal. No evidence of mitral valve regurgitation. No evidence of mitral stenosis.  4. The aortic valve is tricuspid. Aortic valve regurgitation is not visualized. No aortic stenosis is present.  5. The inferior vena cava is normal in size with greater than 50% respiratory variability, suggesting right atrial pressure of 3 mmHg. Conclusion(s)/Recommendation(s): No intracardiac source of embolism detected on this transthoracic study. Consider a transesophageal echocardiogram to exclude cardiac source of embolism if clinically indicated. FINDINGS  Left Ventricle: Left ventricular ejection fraction, by estimation, is 60 to 65%. The left ventricle has normal function. The left ventricle has no regional wall motion abnormalities. The left ventricular internal cavity size was normal in size. There is  no left ventricular hypertrophy. Left ventricular diastolic function could not be evaluated due to  nondiagnostic images. Left ventricular diastolic function could not be evaluated. Right Ventricle: The right ventricular size is normal. No increase in right ventricular wall thickness. Right ventricular systolic function is normal. There is normal pulmonary artery systolic pressure. The tricuspid regurgitant velocity is 1.91 m/s, and  with an assumed right atrial pressure of 3 mmHg, the estimated right ventricular systolic pressure is 48.2 mmHg. Left Atrium: Left atrial size was normal in size. Right Atrium: Right atrial size was normal in size. Pericardium: There is no evidence of pericardial effusion. Mitral Valve: The mitral valve is grossly normal. No evidence of mitral valve regurgitation. No evidence of mitral valve stenosis. Tricuspid Valve: The tricuspid valve is grossly normal. Tricuspid valve regurgitation is trivial. No evidence of tricuspid stenosis. Aortic Valve: The aortic valve is tricuspid. Aortic valve regurgitation is not visualized. No aortic stenosis is present. Pulmonic Valve: The pulmonic valve was grossly normal. Pulmonic valve regurgitation is trivial. No evidence of pulmonic stenosis. Aorta: The aortic root and ascending aorta are structurally normal, with no evidence of dilitation. Venous: The inferior vena cava is normal in size with greater than 50% respiratory variability, suggesting right atrial pressure of 3 mmHg. IAS/Shunts: The atrial septum is grossly normal.  LEFT VENTRICLE PLAX 2D LVIDd:         3.70 cm   Diastology LVIDs:         2.30 cm   LV e' medial:    6.31 cm/s LV PW:         0.80 cm   LV E/e' medial:  11.3 LV  IVS:        0.90 cm   LV e' lateral:   6.85 cm/s LVOT diam:     2.10 cm   LV E/e' lateral: 10.4 LV SV:         48 LV SV Index:   23 LVOT Area:     3.46 cm  RIGHT VENTRICLE RV S prime:     14.00 cm/s TAPSE (M-mode): 1.6 cm LEFT ATRIUM           Index       RIGHT ATRIUM          Index LA diam:      2.20 cm 1.07 cm/m  RA Area:     7.93 cm LA Vol (A4C): 13.6 ml 6.64  ml/m  RA Volume:   14.90 ml 7.27 ml/m  AORTIC VALVE LVOT Vmax:   44.95 cm/s LVOT Vmean:  57.400 cm/s LVOT VTI:    0.138 m  AORTA Ao Root diam: 3.30 cm Ao Asc diam:  3.00 cm MITRAL VALVE               TRICUSPID VALVE MV Area (PHT): 2.88 cm    TR Peak grad:   14.6 mmHg MV Decel Time: 263 msec    TR Vmax:        191.00 cm/s MV E velocity: 71.30 cm/s MV A velocity: 67.40 cm/s  SHUNTS MV E/A ratio:  1.06        Systemic VTI:  0.14 m                            Systemic Diam: 2.10 cm Eleonore Chiquito MD Electronically signed by Eleonore Chiquito MD Signature Date/Time: 05/30/2022/3:20:15 PM    Final     Vascular ultrasound (05/30/2022): Bilateral carotid velocities consistent with 1 to 39% stenosis Bilateral vertebral arteries demonstrate anterograde flow  Discharge Instructions: Discharge Instructions     Ambulatory referral to Physical Therapy   Complete by: As directed    Diet - low sodium heart healthy   Complete by: As directed    Increase activity slowly   Complete by: As directed      Nicholas Holt,  It was a pleasure taking care of you at [facility] were admitted for falls and difficulty walking.  We consulted neurology service to see you and they recommended further imaging for your heart and neck which were both normal.  They also recommended controlling your blood pressure and cholesterol with medications. We are discharging you home now that you are doing better. Please follow the following instructions.   1) For your blood pressure, start losartan 25 mg daily. Continue amlodipine 5 mg daily 2) For your cholesterol, start atorvastatin 40 mg daily and aspirin 81 mg daily 3) please follow-up with internal medicine clinic this Friday to get some lab work 4) Follow-up with neurology in the outpatient 5) Call your primary care doctor if you have worsening falls, confusion, weakness or numbness.  Take care,  Dr. Linwood Dibbles, MD, MPH  Signed: Linus Galas,  MD 05/30/2022, 4:47 PM   Pager: [phone]

## 2022-05-30 NOTE — Care Management Obs Status (Signed)
[facility] NOTIFICATION   Patient Details  Name: Nicholas Holt MRN: [id number] Date of Birth: 17-Mar-1981   Medicare Observation Status Notification Given:  Yes    Tom-Johnson, Renea Ee, RN 05/30/2022, 3:22 PM

## 2022-05-30 NOTE — Discharge Instructions (Addendum)
Nicholas Holt,  It was a pleasure taking care of you at [facility] were admitted for falls and difficulty walking.  We consulted neurology service to see you and they recommended further imaging for your heart and neck which were both normal.  They also recommended controlling your blood pressure and cholesterol with medications. We are discharging you home now that you are doing better. Please follow the following instructions.   1) For your blood pressure, start losartan 25 mg daily. Continue amlodipine 5 mg daily 2) For your cholesterol, start atorvastatin 40 mg daily and aspirin 81 mg daily 3) please follow-up with internal medicine clinic this Friday to get some lab work 4) Follow-up with neurology in the outpatient 5) Call your primary care doctor if you have worsening falls, confusion, weakness or numbness.  Take care,  Dr. Linwood Dibbles, MD, MPH

## 2022-05-30 NOTE — Evaluation (Addendum)
Addendum: After discussion with OT and pt with decreased ability to manage RW due to L wrist tone. PT will defer DME suggestion to Outpatient PT for most appropriate DME. Encouraged pt and his mother to use gait belt to provide assist.   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 Pt mother reports history of falling in the past month. Pt lives with parents in multistory home with 4 steps to enter and flight of stairs to his bed and bathroom. Pt was independent with ambulation without AD, bowling almost every day. Father assists with bathing and pt mostly independent in dressing. Family provides for iADLs. Pt's mother reports that after a fall that family stopped going bowling for safety reasons. Possible that this decrease in activity is a factor in pt increased L LE weakness, joint tightness and subsequent falls. Pt ambulates with increased flexion in L knee and on his toes, sometimes hopping on his L side instead of full gait step. PT recommending use of RW for safety and referral to Outpatient Neuro PT, will goal of returning to prior level of activity, including bowling.        Recommendations for follow up therapy are one component of a multi-disciplinary discharge planning process, led by the attending physician.  Recommendations may be updated based on patient status, additional functional criteria and insurance  authorization.  Follow Up Recommendations Outpatient Neuro ([address]) PT      Assistance Recommended at Discharge Frequent or constant Supervision/Assistance  Patient can return home with the following  A little help with walking and/or transfers;A little help with bathing/dressing/bathroom;Assistance with cooking/housework;Direct supervision/assist for medications management;Direct supervision/assist for financial management;Assist for transportation;Help with stairs or ramp for entrance    Equipment Recommendations Defer to Outpatient PT     Functional Status Assessment Patient has had a recent decline in their functional status and demonstrates the ability to make significant improvements in function in a reasonable and predictable amount of time.     Precautions / Restrictions Precautions Precautions: Fall Restrictions Weight Bearing Restrictions: No      Mobility  Bed Mobility Overal bed mobility: Independent                  Transfers Overall transfer level: Modified independent                 General transfer comment: slight bracing with LE on side of bed to assist steadying after power up    Ambulation/Gait Ambulation/Gait assistance: Min assist, Min guard Gait Distance (Feet): 50 Feet (+150) Assistive device: None, Rolling walker (2 wheels) Gait Pattern/deviations: Step-to pattern, Step-through pattern, Knee flexed in stance - left, Decreased dorsiflexion - left, Decreased step length - left, Decreased stance time - left Gait velocity: slowed Gait velocity interpretation: 1.31 - 2.62 ft/sec, indicative of limited community ambulator   General Gait Details: light min A for steadying with unassisted gait, pt preference to use handrail in hallway, trialed RW, requires increased cuing for proximity, however with increased stability able  to work on increased knee extension and dorsiflexion on L        Balance Overall balance assessment: Needs  assistance Sitting-balance support: Feet supported, Bilateral upper extremity supported, No upper extremity supported Sitting balance-Leahy Scale: Poor Sitting balance - Comments: able to sit with UE support increasing posterior lean with fatigue Postural control: Posterior lean                                   Pertinent Vitals/Pain Pain Assessment Pain Assessment: No/denies pain    Home Living Family/patient expects to be discharged to:: Private residence Living Arrangements: Parent Available Help at Discharge: Family;Available 24 hours/day Type of Home: House Home Access: Stairs to enter Entrance Stairs-Rails: Right Entrance Stairs-Number of Steps: 4 Alternate Level Stairs-Number of Steps: 15 Home Layout: Multi-level Home Equipment: Grab bars - tub/shower;Hand held shower head      Prior Function Prior Level of Function : Needs assist             Mobility Comments: ambulated independently without AD, avid bowler ADLs Comments: father assists with bathing, able to dress most of the time, parents provide for iADLs        Extremity/Trunk Assessment   Upper Extremity Assessment Upper Extremity Assessment: Defer to OT evaluation    Lower Extremity Assessment Lower Extremity Assessment: LLE deficits/detail;RLE deficits/detail RLE Deficits / Details: ROM WFL, strength grossly 4+/5 RLE Coordination: decreased fine motor;decreased gross motor LLE Deficits / Details: AROM in hip WFL, AROM in knee extension limited due to tightness, AAROM in knee extension with stretch, AROM in knee flexion WFL, lacks 5-10 degrees of dorsiflexion, plantarflexion WFL, hip, knee and ankle strength is 4/5 LLE Coordination: decreased fine motor;decreased gross motor       Communication   Communication: Expressive difficulties (difficult to understand when he gets excited)  Cognition Arousal/Alertness: Awake/alert Behavior During Therapy: WFL for tasks  assessed/performed Overall Cognitive Status: History of cognitive impairments - at baseline                                 General Comments: decreased safety awareness, requires increased cuing,        General Comments General comments (skin integrity, edema, etc.): VSS on RA, mother in room provides clarification of pt responses to home set up and PLOF        Assessment/Plan    PT Assessment Patient needs continued PT services  PT Problem List Decreased strength;Decreased range of motion;Decreased activity tolerance;Decreased balance;Decreased mobility;Decreased coordination;Decreased cognition;Decreased safety awareness;Decreased knowledge of use of DME       PT Treatment Interventions DME instruction;Gait training;Stair training;Functional mobility training;Therapeutic activities;Therapeutic exercise;Balance training;Neuromuscular re-education;Patient/family education    PT Goals (Current goals can be found in the Care Plan section)  Acute Rehab PT Goals Patient Stated Goal: get back to bowling at least weekly PT Goal Formulation: With patient/family Time For Goal Achievement: 06/13/22 Potential to Achieve Goals: Good    Frequency Min 3X/week        AM-PAC PT "6 Clicks" Mobility  Outcome Measure Help needed turning from your back to your side while in a flat bed without using bedrails?: None Help needed moving from lying on your back to sitting on the side of a flat bed without using bedrails?: None Help needed moving to and from a bed to a chair (including a wheelchair)?: None  Help needed standing up from a chair using your arms (e.g., wheelchair or bedside chair)?: None Help needed to walk in hospital room?: A Little Help needed climbing 3-5 steps with a railing? : A Lot 6 Click Score: 21    End of Session Equipment Utilized During Treatment: Gait belt Activity Tolerance: Patient tolerated treatment well Patient left: in bed;with call bell/phone  within reach;with family/visitor present Nurse Communication: Mobility status PT Visit Diagnosis: Other abnormalities of gait and mobility (R26.89);Unsteadiness on feet (R26.81);Repeated falls (R29.6);Muscle weakness (generalized) (M62.81);Ataxic gait (R26.0)    Time: 7106-2694 PT Time Calculation (min) (ACUTE ONLY): 45 min   Charges:   PT Evaluation $PT Eval Moderate Complexity: 1 Mod PT Treatments $Gait Training: 8-22 mins $Therapeutic Activity: 8-22 mins        Kalecia Hartney B. Migdalia Dk PT, DPT Acute Rehabilitation Services Please use secure chat or  Call Office [phone]   [place] 05/30/2022, 9:28 AM

## 2022-06-02 ENCOUNTER — Ambulatory Visit: Payer: Medicare Other | Admitting: Physical Therapy

## 2022-06-02 ENCOUNTER — Ambulatory Visit (INDEPENDENT_AMBULATORY_CARE_PROVIDER_SITE_OTHER): Payer: Medicare Other | Admitting: Student

## 2022-06-02 ENCOUNTER — Encounter: Payer: Self-pay | Admitting: Student

## 2022-06-02 VITALS — BP 128/76 | HR 72 | Temp 97.9°F | Ht 68.0 in | Wt 204.5 lb

## 2022-06-02 DIAGNOSIS — I10 Essential (primary) hypertension: Secondary | ICD-10-CM | POA: Diagnosis not present

## 2022-06-02 DIAGNOSIS — E230 Hypopituitarism: Secondary | ICD-10-CM | POA: Diagnosis not present

## 2022-06-02 DIAGNOSIS — I6381 Other cerebral infarction due to occlusion or stenosis of small artery: Secondary | ICD-10-CM

## 2022-06-02 DIAGNOSIS — Z23 Encounter for immunization: Secondary | ICD-10-CM | POA: Diagnosis present

## 2022-06-02 DIAGNOSIS — Z0000 Encounter for general adult medical examination without abnormal findings: Secondary | ICD-10-CM

## 2022-06-02 DIAGNOSIS — E871 Hypo-osmolality and hyponatremia: Secondary | ICD-10-CM

## 2022-06-02 MED ORDER — HYDROCORTISONE 10 MG PO TABS: 5.0000 mg | ORAL_TABLET | Freq: Every morning | ORAL | Status: DC

## 2022-06-02 NOTE — Assessment & Plan Note (Signed)
Patient is currently taking hydrocortisone 5 mg, desmopressin and testosterone supplement for his panhypopituitary. He is following Dr. Buddy Duty with endocrinology.  His hydrocortisone was decreased from 15 to 5 mg due to his weight loss.  His sodium in the hospital was 127.  Urine studies consistent with hypotonic euvolemic hyponatremia, likely in the setting of adrenal insufficiencies.  We will recheck BMP today..  If sodium remains low, patient may need adjustment of his hydrocortisone.  He will follow-up with Dr. Buddy Duty on 7/31.  Addendum Sodium trending down to 122 with K 5.3.  I am worried about worsening adrenal insufficiency.  Patient also takes potassium supplement which I advised him to hold at this time.  He already took 1 dose this morning.  Patient's mother reports patient is doing fine, no change in mental status or any symptoms.  She will bring him back to the clinic for repeat BMP this afternoon.  I also messaged Dr. Buddy Duty about his blood work.

## 2022-06-02 NOTE — Assessment & Plan Note (Signed)
Tdap vaccine given today.  

## 2022-06-02 NOTE — Patient Instructions (Addendum)
Nicholas Holt,  It was nice seeing you in the clinic today.  Here is a summary what we talked about:  1.  For your issue of walking, please continue physical therapy to build up your strength.  Please continue aspirin and Lipitor, let us know if you have any side effects.  Your follow-up appointment with neurology is on 06/15/2022.  2.  We will check your kidney function and sodium level.  Please follow-up with your endocrinologist as scheduled.  3.  Your blood pressure is well controlled today.  Please continue amlodipine and losartan.  Please return in 1 months, sooner if needed  Take care  Dr. Alfonse Spruce

## 2022-06-02 NOTE — Progress Notes (Signed)
CC: Hospital follow-up for ataxia  HPI:  [name] is a 41 y.o. 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  Patient is accompanied with his mom.  Please see problem based charting for detail  Past Medical History:  Diagnosis Date   Anxiety    Attention deficit disorder 08/08/2006   Bilateral leg cramps 11/25/2006   Blood transfusion without reported diagnosis 1997   Chemotherapy associated   Cough 12/08/2015   Elevated hemidiaphragm 07/09/2015   Left, presumably after portacath placement.    H/O malignant neoplasm of brain 09/22/2014   Suprasellar germinoma treated at [facility] in 1997; received chemotherapy (cisplatin, etoposide, vincristine, and cyclophosphomide) and radiation therapy.  Complicated by left sided motor tic disorder and panhypopituitarism.    Hyperlipidemia 08/08/2006   Hypertension    Hypothyroidism    Imbalance    at risk for falls   Night sweats 02/06/2018   Obesity (BMI 30.0-34.9) 07/09/2015   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) ([place]) 08/08/2006   Following therapy for the suprasellar germinoma.  Includes central diabetes insipidus, secondary hypothyroidism, secondary adrenal insufficiency, and secondary hypogonadism.    Perennial allergic rhinitis 07/09/2015   Pre-diabetes    no meds   Seizure disorder ([place]) 08/08/2006   Predated brain tumor.  Generalized seizure ocurred after running out of lorazepam post-tumor.    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 Treated with alendronate from 2009-2016. DEXA (03/16/2008): L Spine T -2.0. L fem neck T -0.9, R fem neck T -1.9.  DEXA (10/15/2014): L Spine T -1.3, L fem neck T -0.6, R fem neck T -1.8.  Bisphosphonate holiday 06/2015.  Repeat DEXA scan in 10/2016 to  reassess.    Review of Systems: Per HPI  Physical Exam:  Vitals:   06/02/22 0928  BP: 128/76  Pulse: 72  Temp: 97.9 F (36.6 C)  TempSrc: Oral  SpO2: 100%  Weight: 204 lb 8 oz (92.8 kg)  Height: '5\' 8"'$  (1.727 m)   Physical Exam Constitutional:      General: He is not in acute distress.    Appearance: He is not ill-appearing.  HENT:     Head: Normocephalic.  Eyes:     General:        Right eye: No discharge.     Conjunctiva/sclera: Conjunctivae normal.  Cardiovascular:     Rate and Rhythm: Normal rate and regular rhythm.  Pulmonary:     Effort: Pulmonary effort is normal. No respiratory distress.     Breath sounds: Normal breath sounds. No wheezing.  Abdominal:     General: Bowel sounds are normal. There is no distension.     Palpations: Abdomen is soft.     Tenderness: There is no abdominal tenderness.  Skin:    General: Skin is warm.  Neurological:     Mental Status: He is alert.     Comments: PERRLA. Patient has difficulty following my finger using his EOM.  Denies any vision change 5/5 strength of bilateral upper extremity.  Left wrist contracted. Left foot drop.  2/5 strength of dorsiflexion and plantarflexion Right foot 5/5 strength  Psychiatric:        Mood and Affect: Mood normal.      Assessment & Plan:   See Encounters Tab for problem based charting.  Hypertension Blood pressure well controlled at 128/76. Patient is currently taking amlodipine  5 mg and losartan 25 mg.  -Continue current regimen -Recheck BMP today  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) ([place]) Patient is currently taking hydrocortisone 5 mg, desmopressin and testosterone supplement for his panhypopituitary. He is following Dr. Buddy Duty with endocrinology.  His hydrocortisone was decreased from 15 to 5 mg due to his weight loss.  His sodium in the hospital was 127.  Urine studies consistent with hypotonic euvolemic hyponatremia, likely in the setting of  adrenal insufficiencies.  We will recheck BMP today..  If sodium remains low, patient may need adjustment of his hydrocortisone.  He will follow-up with Dr. Buddy Duty on 7/31.  Multiple lacunar infarcts [facility]) Patient has been having issue with ataxia for the last few months.  He was admitted to the hospital in July for work-up.  MRI brain was negative for acute infarct but did show chronic lacunar infarct in the basal ganglia.  CTA was not obtained due to insufficiencies of his vein for contrast administration.  Bilateral carotid Doppler showed 1-39% stenosis bilaterally.  Echocardiogram did not show any signs of thrombus.  His ataxia is thought due to his chronic lacunar infarct and physical deconditioning.  Patient reports stable neurological function since discharge.  Patient has 5/5 strength of bilateral upper extremity.  Left wrist contracted.  He has left foot drop with  2/5 strength of dorsiflexion and plantarflexion. Right foot 5/5 strength.  Patient was started on aspirin 81 mg and Lipitor 40 mg for his LDL 121.  Patient denies any side effects from Lipitor.   -I encouraged patient to follow-up with PT to build up the strength -Follow-up with neurology on 8/3 -Continue aspirin and Lipitor  Preventative health care Tdap vaccine given today   Patient discussed with Dr. Dareen Piano

## 2022-06-02 NOTE — Assessment & Plan Note (Signed)
Blood pressure well controlled at 128/76. Patient is currently taking amlodipine 5 mg and losartan 25 mg.  -Continue current regimen -Recheck BMP today

## 2022-06-02 NOTE — Assessment & Plan Note (Signed)
Patient has been having issue with ataxia for the last few months.  He was admitted to the hospital in July for work-up.  MRI brain was negative for acute infarct but did show chronic lacunar infarct in the basal ganglia.  CTA was not obtained due to insufficiencies of his vein for contrast administration.  Bilateral carotid Doppler showed 1-39% stenosis bilaterally.  Echocardiogram did not show any signs of thrombus.  His ataxia is thought due to his chronic lacunar infarct and physical deconditioning.  Patient reports stable neurological function since discharge.  Patient has 5/5 strength of bilateral upper extremity.  Left wrist contracted.  He has left foot drop with  2/5 strength of dorsiflexion and plantarflexion. Right foot 5/5 strength.  Patient was started on aspirin 81 mg and Lipitor 40 mg for his LDL 121.  Patient denies any side effects from Lipitor.   -I encouraged patient to follow-up with PT to build up the strength -Follow-up with neurology on 8/3 -Continue aspirin and Lipitor

## 2022-06-03 LAB — BMP8+ANION GAP
Anion Gap: 16 mmol/L (ref 10.0–18.0)
BUN/Creatinine Ratio: 10 (ref 9–20)
BUN: 9 mg/dL (ref 6–24)
CO2: 20 mmol/L (ref 20–29)
Calcium: 9.1 mg/dL (ref 8.7–10.2)
Chloride: 86 mmol/L — ABNORMAL LOW (ref 96–106)
Creatinine, Ser: 0.89 mg/dL (ref 0.76–1.27)
Glucose: 88 mg/dL (ref 70–99)
Potassium: 5.3 mmol/L — ABNORMAL HIGH (ref 3.5–5.2)
Sodium: 122 mmol/L — ABNORMAL LOW (ref 134–144)
eGFR: 110 mL/min/{1.73_m2} (ref >59)

## 2022-06-05 ENCOUNTER — Ambulatory Visit
Payer: Medicare Other | Attending: Student in an Organized Health Care Education/Training Program | Admitting: Physical Therapy

## 2022-06-05 ENCOUNTER — Other Ambulatory Visit (INDEPENDENT_AMBULATORY_CARE_PROVIDER_SITE_OTHER): Payer: Medicare Other

## 2022-06-05 DIAGNOSIS — Z9181 History of falling: Secondary | ICD-10-CM | POA: Diagnosis not present

## 2022-06-05 DIAGNOSIS — R2681 Unsteadiness on feet: Secondary | ICD-10-CM | POA: Diagnosis not present

## 2022-06-05 DIAGNOSIS — I6381 Other cerebral infarction due to occlusion or stenosis of small artery: Secondary | ICD-10-CM | POA: Insufficient documentation

## 2022-06-05 DIAGNOSIS — R2689 Other abnormalities of gait and mobility: Secondary | ICD-10-CM | POA: Diagnosis not present

## 2022-06-05 DIAGNOSIS — M6281 Muscle weakness (generalized): Secondary | ICD-10-CM | POA: Insufficient documentation

## 2022-06-05 DIAGNOSIS — E871 Hypo-osmolality and hyponatremia: Secondary | ICD-10-CM

## 2022-06-05 DIAGNOSIS — R531 Weakness: Secondary | ICD-10-CM | POA: Diagnosis not present

## 2022-06-05 DIAGNOSIS — R29818 Other symptoms and signs involving the nervous system: Secondary | ICD-10-CM | POA: Diagnosis not present

## 2022-06-05 LAB — GLUCOSE, CAPILLARY
Glucose-Capillary: 172 mg/dL — ABNORMAL HIGH (ref 70–99)
Glucose-Capillary: 129 mg/dL — ABNORMAL HIGH (ref 70–99)

## 2022-06-05 NOTE — Addendum Note (Signed)
Addended byGaylan Gerold on: 06/05/2022 10:38 AM   Modules accepted: Orders

## 2022-06-05 NOTE — Therapy (Signed)
OUTPATIENT PHYSICAL THERAPY NEURO EVALUATION   Patient Name: Nicholas Holt MRN: [id number] DOB:Jan 29, 1981, 41 y.o., male Today's Date: 06/06/2022   PCP: Aldine Contes, MD REFERRING PROVIDER: Axel Filler, MD   PT End of Session - 06/05/22 1408     Visit Number 1    Number of Visits 17    Date for PT Re-Evaluation 08/05/22    Authorization Type Medicare - Part A & B    PT Start Time 1405    PT Stop Time 1445    PT Time Calculation (min) 40 min    Equipment Utilized During Treatment Gait belt    Activity Tolerance Patient tolerated treatment well    Behavior During Therapy [facility] for tasks assessed/performed             Past Medical History:  Diagnosis Date   Anxiety    Attention deficit disorder 08/08/2006   Bilateral leg cramps 11/25/2006   Blood transfusion without reported diagnosis 1997   Chemotherapy associated   Cough 12/08/2015   Elevated hemidiaphragm 07/09/2015   Left, presumably after portacath placement.    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  Complicated by left sided motor tic disorder and panhypopituitarism.    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  Includes central diabetes insipidus, secondary hypothyroidism, secondary adrenal insufficiency, and secondary hypogonadism.    Perennial allergic rhinitis 07/09/2015   Pre-diabetes    no meds   Seizure disorder ([place]) 08/08/2006   Predated brain tumor.  Generalized seizure ocurred after running out of lorazepam post-tumor.    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 Treated with alendronate from 2009-2016. DEXA (03/16/2008): L Spine T -2.0. L fem neck T -0.9, R fem neck T -1.9.  DEXA (10/15/2014): L Spine T -1.3, L fem neck T -0.6, R fem neck T -1.8.  Bisphosphonate holiday 06/2015.  Repeat DEXA scan in 10/2016 to reassess.    Past Surgical History:  Procedure Laterality Date   BRAIN BIOPSY  11/14/1995   CSF SHUNT  11/14/1995   PORTACATH PLACEMENT     portacath removal     RADIOLOGY WITH ANESTHESIA N/A 08/30/2021   Procedure: MRI WITH ANESTHESIA BRAIN WITH AND WITHOUT CONTRAST;  Surgeon: Radiologist, Medication, MD;  Location: [place];  Service: Radiology;  Laterality: N/A;   RADIOLOGY WITH ANESTHESIA N/A 05/25/2022   Procedure: MRI WITH ANESTHESIA OF BRAIN WITH AND WITHOUT CONTRAST;  Surgeon: Radiologist, Medication, MD;  Location: [place];  Service: Radiology;  Laterality: N/A;   Patient Active Problem List   Diagnosis Date Noted   Hyponatremia 05/30/2022   Multiple lacunar infarcts [facility]) 05/04/2022   Fall 04/13/2022   Healthcare maintenance 08/15/2021   AKI (acute kidney injury) ([place]) 01/11/2021   Hypertension 12/09/2019   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   Elevated hemidiaphragm 07/09/2015   Obesity (BMI 30.0-34.9) 07/09/2015   Perennial allergic rhinitis 07/09/2015   Preventative health care 07/09/2015   H/O malignant neoplasm of brain 09/22/2014   Steroid-induced osteoporosis 03/27/2008   Bilateral leg cramps 11/25/2006   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) ([place]) 08/08/2006   Hyperlipidemia 08/08/2006  Attention deficit disorder 08/08/2006   Seizure disorder ([place]) 08/08/2006    ONSET DATE: 05/30/2022  REFERRING DIAG: R53.1 (ICD-10-CM) - Weakness I63.81 (ICD-10-CM) - Multiple lacunar infarcts (HCC)    THERAPY DIAG:  Unsteadiness on feet  History of falling  Other abnormalities of gait and mobility  Muscle weakness (generalized)  Other symptoms and signs  involving the nervous system  Rationale for Evaluation and Treatment Rehabilitation  SUBJECTIVE:                                                                                                                                                                                              SUBJECTIVE STATEMENT: Has always walked on his toes, now balance and walking is worse than it was before. Trying to reach out and touch a wall for his balance. Has had several falls, one was when he was on the wood floor with socks on. Likes to bowl, but can't do it anymore, last time he went bowling was a couple years ago. Now his legs have gotten a lot weaker. Not doing anything for exercise. Has not received PT before.   Pt accompanied by: self and family member, mom Nacomas  GOES BY [name]  PERTINENT HISTORY: Nicholas Holt is a 41 y.o. male with history of suprasellar germinoma at age 38 with LUE/LLE hemiparesis (patient has been cared for by his mother since this time and has had persistent gait difficulties and limitation of left arm ROM), HTN, HLD, anxiety, ADD, chronic ataxia. Patient walks on his toes at baseline and has been more unsteady than usual over the past few weeks.  Pt was directly admitted  on 05/29/22 for work-up of acutely worsening balance and ambulation with recent MRI showeing evidence of prior hemorrhages and lacunar infarcts in bilateral basal ganglia and cerebellum   PAIN:  Are you having pain? No  PRECAUTIONS: Fall  FALLS: Has patient fallen in last 6 months? Yes. Number of falls 4 or 5, lots of near misses (per his mom)  LIVING ENVIRONMENT: Lives with: lives with their family - lives with mom and dad  Lives in: House/apartment Stairs: Yes: Internal: 10-15 steps; on right going up and External: 3 steps; none - pt has to be very careful  Has following equipment at home: Single point cane, shower chair, and Grab bars  PLOF: Independent with basic ADLs and Needs assistance  with gait  Dad helps with pt get in and out of the tub, can bathe everything except his back.   PATIENT GOALS Be able to bowl again every Saturday morning   OBJECTIVE:   COGNITION: Overall cognitive  status: Within functional limits for tasks assessed   SENSATION: WFL  COORDINATION: Heel to shin: impaired with LLE     MUSCLE TONE: RLE: Hypertonic Spastic LUE    POSTURE: rounded shoulders and forward head Pt stands with incr weight shift to R and on his L toes with L knee flexion.   LOWER EXTREMITY ROM:    With L ankle DF, able to reach neutral passively.   LOWER EXTREMITY MMT:    MMT Right Eval Left Eval  Hip flexion 4+/5 4+/5  Hip extension    Hip abduction 4/5 4/5  Hip adduction 5/5 5/5  Hip internal rotation    Hip external rotation    Knee flexion 4+/5 3+/5  Knee extension 4+/5 4-/5  Ankle dorsiflexion 5/5 2/5  Ankle plantarflexion    Ankle inversion    Ankle eversion    (Blank rows = not tested)  Tested in sitting   BED MOBILITY:  Pt reports no issues   TRANSFERS: Assistive device utilized: None  Sit to stand: SBA Stand to sit: SBA Performs with BUE support, unable to perform without UE   GAIT: Gait pattern: step to pattern, step through pattern, decreased arm swing- Right, decreased arm swing- Left, decreased stance time- Left, decreased stride length, decreased ankle dorsiflexion- Left, Left steppage, knee flexed in stance- Left, decreased trunk rotation, and poor foot clearance- Left Distance walked: Clinic distances Assistive device utilized: None Level of assistance: CGA and Min A Comments: Pt walks on his L toes with L knee flexion (this is chronic, but has been worse recently)   FUNCTIONAL TESTs:  5 times sit to stand: 23.66 seconds with BUE support  Timed up and go (TUG): 26.6 seconds with min A, no AD  10 meter walk test: 17.43 seconds = 1.88 ft/sec with no AD     [facility] PT Assessment - 06/05/22 1433       Standardized Balance  Assessment   Standardized Balance Assessment Berg Balance Test      Berg Balance Test   Sit to Stand Able to stand  independently using hands    Standing Unsupported Able to stand safely 2 minutes    Sitting with Back Unsupported but Feet Supported on Floor or Stool Able to sit safely and securely 2 minutes    Stand to Sit Sits safely with minimal use of hands    Transfers Able to transfer safely, definite need of hands    Standing Unsupported with Eyes Closed Able to stand 10 seconds safely    Standing Unsupported with Feet Together Able to place feet together independently and stand for 1 minute with supervision   pt tries to reach out towards computer for balance            Will need to finish BERG at next session, unable to finish at eval due to time constraints.   TODAY'S TREATMENT:  N/A during eval.    PATIENT EDUCATION: Education details: Clinical findings, POC, potential of trying AFO in future sessions  Person educated: Patient and Parent Education method: Explanation Education comprehension: verbalized understanding   HOME EXERCISE PROGRAM: Will provide at next session.     GOALS: Goals reviewed with patient? Yes  SHORT TERM GOALS: Target date: 07/04/2022  Pt will be independent with initial HEP with family supervision in order to build upon functional gains made in therapy.  Baseline: Goal status: INITIAL  2.  Pt will finish assessment of BERG with LTG written. Baseline:  Goal status: INITIAL  3.  Pt will improve 5x sit<>stand to less than or equal to 19 sec with BUE support to demonstrate improved functional strength and transfer efficiency.   Baseline: 23.66 seconds with BUE support Goal status: INITIAL  4.  Pt will improve gait speed with no AD vs. LRAD to at least 2.1 ft/sec in order to demo improved community mobility.   Baseline: 1.88 ft/sec with no AD  Goal status: INITIAL  5.  Pt will improve TUG time to 22 seconds or less with no AD vs.  LRAD in order to demo decrease fall risk.  Baseline: 26.6 seconds no AD with min A Goal status: INITIAL   LONG TERM GOALS: Target date: 08/01/2022  Pt will be independent with final HEP with family supervision in order to build upon functional gains made in therapy. Baseline:  Goal status: INITIAL  2.  BERG goal to be written as appropriate to demo decr fall risk. Baseline:  Goal status: INITIAL  3.  Pt will improve TUG time to 22 seconds or less with no AD vs. LRAD in order to demo decrease fall risk. Baseline: 26.6 seconds no AD with min A Goal status: INITIAL  4.  Pt will go up and down 4 steps with step to pattern and supervision with no handrail in order to safely enter/exit home. Baseline: Stairs not assessed - pt has 3 steps to get in and out without handrails Goal status: INITIAL  5.  Pt will improve gait speed with no AD vs. LRAD to at least 2.4 ft/sec in order to demo improved community mobility.  Baseline: 1.88 ft/sec with no AD  Goal status: INITIAL  ASSESSMENT:  CLINICAL IMPRESSION: Patient is a 41 year old male referred to Neuro OPPT for weakness/multiple lacunar infarts.   Pt's PMH is significant for: suprasellar germinoma status post chemo and radiation therapy at age 68 with LUE/LLE hemiparesis (patient has been cared for by his mother since this time and has had persistent gait difficulties and limitation of left arm ROM), HTN, HLD, anxiety, ADD, chronic ataxia. The following deficits were present during the exam: impaired balance, gait abnormalities, LUE/LLE hemiparesis, decr ROM, decr safety awareness, decr strength, postural abnormalities, decr safety awareness, decr activity tolerance. Based on TUG, gait speed, and 5x sit <> stand pt is an incr risk for falls. Will finish BERG at next session. Pt would benefit from skilled PT to address these impairments and functional limitations to maximize functional mobility independence    OBJECTIVE IMPAIRMENTS Abnormal  gait, decreased activity tolerance, decreased balance, decreased coordination, decreased endurance, decreased knowledge of use of DME, decreased mobility, difficulty walking, decreased ROM, decreased strength, decreased safety awareness, impaired flexibility, impaired tone, impaired UE functional use, and postural dysfunction.   ACTIVITY LIMITATIONS carrying, standing, stairs, transfers, bathing, and locomotion level  PARTICIPATION LIMITATIONS: meal prep, cleaning, driving, community activity, and bowling  PERSONAL FACTORS Behavior pattern, Past/current experiences, Time since onset of injury/illness/exacerbation, and 3+ comorbidities: suprasellar germinoma status post chemo and radiation therapy at age 60 with LUE/LLE hemiparesis (patient has been cared for by his mother since this time and has had persistent gait difficulties and limitation of left arm ROM), HTN, HLD, anxiety, ADD, chronic ataxia.   are also affecting patient's functional outcome.   REHAB POTENTIAL: Fair due to chronicity of condition, pt has not received PT before.  CLINICAL DECISION MAKING: Evolving/moderate complexity  EVALUATION COMPLEXITY: Moderate  PLAN: PT FREQUENCY: 2x/week  PT DURATION: 8 weeks  PLANNED INTERVENTIONS: Therapeutic exercises, Therapeutic activity,  Neuromuscular re-education, Balance training, Gait training, Patient/Family education, Self Care, Stair training, Vestibular training, Orthotic/Fit training, DME instructions, and Manual therapy  PLAN FOR NEXT SESSION: Finish BERG and write goal. Initial HEP for balance, strengthening, ROM esp L hamstring and ankle. Appropriate AD? Try a SPC? Pt has LUE hemiparesis so not sure a RW would work. Trial AFOs as able.    Arliss Journey, PT, DPT  06/06/2022, 11:34 AM

## 2022-06-05 NOTE — Progress Notes (Signed)
Internal Medicine Clinic Attending  I saw and evaluated the patient.  I personally confirmed the key portions of the history and exam documented by Dr. Nguyen and I reviewed pertinent patient test results.  The assessment, diagnosis, and plan were formulated together and I agree with the documentation in the resident's note.\  

## 2022-06-06 ENCOUNTER — Other Ambulatory Visit: Payer: Self-pay | Admitting: Student

## 2022-06-06 ENCOUNTER — Emergency Department (HOSPITAL_COMMUNITY)
Admission: EM | Admit: 2022-06-06 | Discharge: 2022-06-06 | Disposition: A | Discharge status: Home / Self Care | Payer: Medicare Other | Attending: Emergency Medicine | Admitting: Emergency Medicine

## 2022-06-06 ENCOUNTER — Other Ambulatory Visit: Payer: Self-pay

## 2022-06-06 ENCOUNTER — Telehealth: Payer: Self-pay | Admitting: *Deleted

## 2022-06-06 ENCOUNTER — Encounter (HOSPITAL_COMMUNITY): Payer: Self-pay

## 2022-06-06 DIAGNOSIS — Z7982 Long term (current) use of aspirin: Secondary | ICD-10-CM | POA: Diagnosis not present

## 2022-06-06 DIAGNOSIS — E871 Hypo-osmolality and hyponatremia: Secondary | ICD-10-CM

## 2022-06-06 DIAGNOSIS — Z79899 Other long term (current) drug therapy: Secondary | ICD-10-CM | POA: Insufficient documentation

## 2022-06-06 DIAGNOSIS — E039 Hypothyroidism, unspecified: Secondary | ICD-10-CM | POA: Insufficient documentation

## 2022-06-06 DIAGNOSIS — E230 Hypopituitarism: Secondary | ICD-10-CM

## 2022-06-06 DIAGNOSIS — I10 Essential (primary) hypertension: Secondary | ICD-10-CM | POA: Diagnosis not present

## 2022-06-06 LAB — COMPREHENSIVE METABOLIC PANEL
ALT: 35 U/L (ref 0–44)
AST: 41 U/L (ref 15–41)
Albumin: 4.3 g/dL (ref 3.5–5.0)
Alkaline Phosphatase: 68 U/L (ref 38–126)
Anion gap: 10 (ref 5–15)
BUN: 11 mg/dL (ref 6–20)
CO2: 24 mmol/L (ref 22–32)
Calcium: 9 mg/dL (ref 8.9–10.3)
Chloride: 89 mmol/L — ABNORMAL LOW (ref 98–111)
Creatinine, Ser: 0.91 mg/dL (ref 0.61–1.24)
GFR, Estimated: >60 mL/min (ref >60)
Glucose, Bld: 77 mg/dL (ref 70–99)
Potassium: 3.7 mmol/L (ref 3.5–5.1)
Sodium: 123 mmol/L — ABNORMAL LOW (ref 135–145)
Total Bilirubin: 0.4 mg/dL (ref 0.3–1.2)
Total Protein: 7.2 g/dL (ref 6.5–8.1)

## 2022-06-06 LAB — BMP8+ANION GAP
Anion Gap: 15 mmol/L (ref 10.0–18.0)
BUN/Creatinine Ratio: 10 (ref 9–20)
BUN: 9 mg/dL (ref 6–24)
CO2: 21 mmol/L (ref 20–29)
Calcium: 9.4 mg/dL (ref 8.7–10.2)
Chloride: 82 mmol/L — ABNORMAL LOW (ref 96–106)
Creatinine, Ser: 0.88 mg/dL (ref 0.76–1.27)
Glucose: 87 mg/dL (ref 70–99)
Potassium: 5.1 mmol/L (ref 3.5–5.2)
Sodium: 118 mmol/L — CL (ref 134–144)
eGFR: 110 mL/min/{1.73_m2} (ref >59)

## 2022-06-06 LAB — CBC WITH DIFFERENTIAL/PLATELET
Abs Immature Granulocytes: 0.02 10*3/uL (ref 0.00–0.07)
Basophils Absolute: 0 10*3/uL (ref 0.0–0.1)
Basophils Relative: 1 %
Eosinophils Absolute: 0 10*3/uL (ref 0.0–0.5)
Eosinophils Relative: 1 %
HCT: 38.2 % — ABNORMAL LOW (ref 39.0–52.0)
Hemoglobin: 13.4 g/dL (ref 13.0–17.0)
Immature Granulocytes: 1 %
Lymphocytes Relative: 32 %
Lymphs Abs: 1.3 10*3/uL (ref 0.7–4.0)
MCH: 30.1 pg (ref 26.0–34.0)
MCHC: 35.1 g/dL (ref 30.0–36.0)
MCV: 85.8 fL (ref 80.0–100.0)
Monocytes Absolute: 0.4 10*3/uL (ref 0.1–1.0)
Monocytes Relative: 10 %
Neutro Abs: 2.3 10*3/uL (ref 1.7–7.7)
Neutrophils Relative %: 55 %
Platelets: 215 10*3/uL (ref 150–400)
RBC: 4.45 MIL/uL (ref 4.22–5.81)
RDW: 12.8 % (ref 11.5–15.5)
WBC: 4.1 10*3/uL (ref 4.0–10.5)
nRBC: 0 % (ref 0.0–0.2)

## 2022-06-06 LAB — URINALYSIS, ROUTINE W REFLEX MICROSCOPIC
Bilirubin Urine: NEGATIVE
Glucose, UA: NEGATIVE mg/dL
Hgb urine dipstick: NEGATIVE
Ketones, ur: NEGATIVE mg/dL
Leukocytes,Ua: NEGATIVE
Nitrite: NEGATIVE
Protein, ur: NEGATIVE mg/dL
Specific Gravity, Urine: 1.01 (ref 1.005–1.030)
pH: 6 (ref 5.0–8.0)

## 2022-06-06 LAB — CBG MONITORING, ED: Glucose-Capillary: 87 mg/dL (ref 70–99)

## 2022-06-06 MED ORDER — HYDROCORTISONE 10 MG PO TABS: 10.0000 mg | ORAL_TABLET | Freq: Every morning | ORAL | 5 refills | Status: DC

## 2022-06-06 NOTE — Consult Note (Signed)
Date: 06/06/2022               Nicholas Holt Name:  Nicholas Holt MRN: [id number]  DOB: 12/07/1980 Age / Sex: 41 y.o., male   PCP: Narendra, Nischal, MD         Requesting Physician: Dr. Tegeler, Christopher J, *    Consulting Reason:  Hyponatremia     Chief Complaint: Abnormal lab  History of Present Illness: Nicholas Holt is a 41-year-old male with PMH of suprasellar germinoma s/p chemo and radiation therapy complicated by left-sided motor tic disorder and panhypopituitarism, hHTN, HLD, osteopenia 2/2 chronic steroid use, anxiety, ADD, and chronic ataxia who was advised by PCP office to present to the ER for abnormal sodium level of 118 on lab work done on 06/05/2022. [facility] paged to admit Nicholas Holt.   Upon evaluation, Nicholas Holt found to have improvement in sodium to 123. Mother stated they would like to go home since Nicholas Holt's sodium is now better. States Nicholas Holt feels fine and has been doing well since discharge from the hospital 1 week ago. States the only change in Nicholas Holt's medication is the drop in his hydrocodone from 15 mg to 5 mg last month. Mother also reports that she gave Nicholas Holt some extra doses of the desmopressin during the last hospitalization due to concern for frequent urination. Nicholas Holt states he feels fine overall would like to go home as well. Nicholas Holt denies any confusion, fatigue, nausea/vomiting and states his leg cramps have improved. He has not had any frequent urination or increased fluid intake per mother.  Meds: No current facility-administered medications for this encounter.   Current Outpatient Medications  Medication Sig Dispense Refill   alendronate (FOSAMAX) 70 MG tablet TAKE 1 TABLET BY MOUTH ONCE A WEEK, AS DIRECTED (Nicholas Holt taking differently: Take 70 mg by mouth once a week.) 4 tablet 11   amLODipine (NORVASC) 5 MG tablet TAKE 1 TABLET(5 MG) BY MOUTH DAILY (Nicholas Holt taking differently: Take 5 mg by mouth every evening.) 90 tablet 3   Ascorbic Acid (VITAMIN C)  1000 MG tablet Take 1,000 mg by mouth daily.     aspirin 81 MG chewable tablet Chew 1 tablet (81 mg total) by mouth daily. 30 tablet 5   atorvastatin (LIPITOR) 40 MG tablet Take 1 tablet (40 mg total) by mouth daily. 30 tablet 5   cyclobenzaprine (FLEXERIL) 10 MG tablet TAKE 1 TO 2 TABLETS BY MOUTH AT BEDTIME AS NEEDED FOR MUSCLE SPASMS (Nicholas Holt taking differently: Take 10 mg by mouth at bedtime as needed for muscle spasms.) 120 tablet 0   desmopressin (DDAVP NASAL) 0.01 % solution USE ONE SPRAY IN EACH NOSTRIL FOUR TIMES DAILY (Nicholas Holt taking differently: Place 10 mcg into the nose in the morning, at noon, in the evening, and at bedtime.) 70 mL 3   hydrocortisone (CORTEF) 10 MG tablet Take 0.5 tablets (5 mg total) by mouth in the morning.     levothyroxine (SYNTHROID) 100 MCG tablet Take 100 mcg by mouth daily before breakfast.     LORazepam (ATIVAN) 2 MG tablet TAKE 3 TABLETS(6 MG) BY MOUTH AT BEDTIME (Nicholas Holt taking differently: Take 6 mg by mouth at bedtime.) 90 tablet 0   losartan (COZAAR) 25 MG tablet Take 1 tablet (25 mg total) by mouth daily. 30 tablet 5   potassium chloride (KLOR-CON) 10 MEQ tablet TAKE 2 TABLETS(20 MEQ) BY MOUTH TWICE DAILY (Nicholas Holt taking differently: Take 20 mEq by mouth 2 (two) times daily.) 360 tablet 3   Testosterone (ANDROGEL PUMP) 20.25 MG/ACT (  1.62%) GEL Place 20.25 mg onto the skin daily. Apply 6 pumps 3 pumps per side) topically to shoulders or upper back once a day (Nicholas Holt taking differently: Place 6 Pump onto the skin at bedtime. Apply 6 pumps 3 pumps per side) topically to shoulders or upper back once a day) 75 g 3   Blood Pressure Monitor KIT Please take your blood pressure daily in the morning 1 kit 0   Coenzyme Q10 (COQ10) 100 MG CAPS Take 100 mg by mouth in the morning.     Menaquinone-7 (VITAMIN K2 PO) Take 1 capsule by mouth in the morning.      Allergies: Allergies as of 06/06/2022 - Review Complete 06/06/2022  Allergen Reaction Noted   Codeine Other  (See Comments) 12/08/2021   Past Medical History:  Diagnosis Date   Anxiety    Attention deficit disorder 08/08/2006   Bilateral leg cramps 11/25/2006   Blood transfusion without reported diagnosis 1997   Chemotherapy associated   Cough 12/08/2015   Elevated hemidiaphragm 07/09/2015   Left, presumably after portacath placement.    H/O malignant neoplasm of brain 09/22/2014   Suprasellar germinoma treated at [facility] in 1997; received chemotherapy (cisplatin, etoposide, vincristine, and cyclophosphomide) and radiation therapy.  Complicated by left sided motor tic disorder and panhypopituitarism.    Hyperlipidemia 08/08/2006   Hypertension    Hypothyroidism    Imbalance    at risk for falls   Night sweats 02/06/2018   Obesity (BMI 30.0-34.9) 07/09/2015   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) ([facility]) 08/08/2006   Following therapy for the suprasellar germinoma.  Includes central diabetes insipidus, secondary hypothyroidism, secondary adrenal insufficiency, and secondary hypogonadism.    Perennial allergic rhinitis 07/09/2015   Pre-diabetes    no meds   Seizure disorder ([facility]) 08/08/2006   Predated brain tumor.  Generalized seizure ocurred after running out of lorazepam post-tumor.    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 Treated with alendronate from 2009-2016. DEXA (03/16/2008): L Spine T -2.0. L fem neck T -0.9, R fem neck T -1.9.  DEXA (10/15/2014): L Spine T -1.3, L fem neck T -0.6, R fem neck T -1.8.  Bisphosphonate holiday 06/2015.  Repeat DEXA scan in 10/2016 to reassess.    Past Surgical History:  Procedure Laterality Date   BRAIN BIOPSY  11/14/1995   CSF SHUNT  11/14/1995   PORTACATH PLACEMENT     portacath removal     RADIOLOGY WITH ANESTHESIA N/A 08/30/2021   Procedure: MRI WITH ANESTHESIA BRAIN WITH AND WITHOUT CONTRAST;  Surgeon: Radiologist, Medication, MD;  Location: [facility] OR;  Service: Radiology;   Laterality: N/A;   RADIOLOGY WITH ANESTHESIA N/A 05/25/2022   Procedure: MRI WITH ANESTHESIA OF BRAIN WITH AND WITHOUT CONTRAST;  Surgeon: Radiologist, Medication, MD;  Location: [facility] OR;  Service: Radiology;  Laterality: N/A;   Family History  Problem Relation Age of Onset   Hypertension Mother    Hypertension Father    Hyperlipidemia Father    Prostate cancer Father    Diabetes type I Sister        Died of complications of diabetes in her 30's   Social History   Socioeconomic History   Marital status: Single    Spouse name: Not on file   Number of children: Not on file   Years of education: Not on file   Highest education level: Not on file  Occupational History   Not on file  Tobacco Use   Smoking status: Never     Smokeless tobacco: Never  Vaping Use   Vaping Use: Never used  Substance and Sexual Activity   Alcohol use: No    Alcohol/week: 0.0 standard drinks of alcohol   Drug use: No   Sexual activity: Not on file  Other Topics Concern   Not on file  Social History Narrative   Lives with parents.  Parents retired. Father or mother take him to appointments.  Mother sends notes if she can not take him.   Social Determinants of Health   Financial Resource Strain: Low Risk  (03/17/2022)   Overall Financial Resource Strain (CARDIA)    Difficulty of Paying Living Expenses: Not hard at all  Food Insecurity: No Food Insecurity (03/17/2022)   Hunger Vital Sign    Worried About Running Out of Food in the Last Year: Never true    Ran Out of Food in the Last Year: Never true  Transportation Needs: No Transportation Needs (03/17/2022)   PRAPARE - Transportation    Lack of Transportation (Medical): No    Lack of Transportation (Non-Medical): No  Physical Activity: Sufficiently Active (03/17/2022)   Exercise Vital Sign    Days of Exercise per Week: 5 days    Minutes of Exercise per Session: 60 min  Stress: No Stress Concern Present (03/17/2022)   [employer] - Occupational Stress Questionnaire    Feeling of Stress : Not at all  Social Connections: Socially Isolated (03/17/2022)   Social Connection and Isolation Panel [NHANES]    Frequency of Communication with Friends and Family: More than three times a week    Frequency of Social Gatherings with Friends and Family: More than three times a week    Attends Religious Services: Never    Active Member of Clubs or Organizations: No    Attends Club or Organization Meetings: Never    Marital Status: Never married  Intimate Partner Violence: Not At Risk (03/17/2022)   Humiliation, Afraid, Rape, and Kick questionnaire    Fear of Current or Ex-Partner: No    Emotionally Abused: No    Physically Abused: No    Sexually Abused: No    Review of Systems: Pertinent items are noted in HPI.  Physical Exam: Blood pressure 123/85, pulse 72, temperature (!) 97.1 F (36.2 C), temperature source Axillary, resp. rate 20, SpO2 100 %. General: Pleasant, well-appearing laying in trauma room. No acute distress. CV: RRR. No murmurs, rubs, or gallops. No LE edema Pulmonary: Lungs CTAB. Normal effort. No wheezing or rales. Abdominal: Soft, nontender, nondistended. Normal bowel sounds. Extremities: Radial and DP pulses 2+ and symmetric. Normal ROM. Skin: Warm and dry. No obvious rash or lesions. Neuro: A&Ox3. Chronically contracted LUE. Normal sensation to gross touch. No focal deficit. Psych: Normal mood and affect   Lab results:    Latest Ref Rng & Units 06/06/2022   12:08 PM 06/05/2022    3:26 PM 06/02/2022   10:51 AM  CMP  Glucose 70 - 99 mg/dL 77  87  88   BUN 6 - 20 mg/dL 11  9  9   Creatinine 0.61 - 1.24 mg/dL 0.91  0.88  0.89   Sodium 135 - 145 mmol/L 123  118  122   Potassium 3.5 - 5.1 mmol/L 3.7  5.1  5.3   Chloride 98 - 111 mmol/L 89  82  86   CO2 22 - 32 mmol/L 24  21  20   Calcium 8.9 - 10.3 mg/dL 9.0  9.4  9.1     Total Protein 6.5 - 8.1 g/dL 7.2     Total Bilirubin 0.3 - 1.2 mg/dL 0.4      Alkaline Phos 38 - 126 U/L 68     AST 15 - 41 U/L 41     ALT 0 - 44 U/L 35        Imaging results:  No results found.  Other results: N/A  Assessment, Plan, & Recommendations by Problem: Active Problems:   Hyponatremia  Nicholas Holt is a 41-year-old male with PMH of suprasellar germinoma s/p chemo and radiation therapy complicated by left-sided motor tic disorder and panhypopituitarism, hHTN, HLD, osteopenia 2/2 chronic steroid use, anxiety, ADD, and chronic ataxia who presented for an evaluation of hyponatremia.   Moderate hyponatremia Nicholas Holt is a21-year-old with a history of panhypopituitarism (residual central diabetes insipidus, secondary hypothyroidism, secondary adrenal insufficiency and secondary hypogonadism) 2/2 treated suprasellar germinoma found to have sodium of 118 in PCP office. Nicholas Holt on chronic hydrocortisone for adrenal insufficiency. His sodium has improved to 123. Nicholas Holt remains asymptomatic. On further chart review, Nicholas Holt saw his endocrinologist, Dr. Kerr, on 6/7 and at this visit Nicholas Holt was instructed to decrease his hydrocortisone from 15 to 10 mg and to take an extra dose on sick days. Mother states she was under the impression that she was supposed to give Nicholas Holt only 5 mg so she has been giving Nicholas Holt 5 mg since that appointment. Nicholas Holt's sodium was normal until last hospitalization which showed a sodium of 128. Work-up at that time for the hyponatremia (decreased serum osmolality at 268, increased urine osmolality at 532, and increased urine sodium of 177) was more consistent with his chronic adrenal insufficiency. This is likely secondary to unintentional suboptimal dosing of his hydrocortisone the last 6 weeks. Family expressed desire to be discharge and after discussing admission for observation overnight vs discharge with close follow up, family chose the latter. Mother states Nicholas Holt has an appointment with his Endocrinologist on Monday. We will  follow-up with Nicholas Holt in clinic for repeat lab work. --Increase hydrocortisone from 5 mg daily to 10 mg daily --Lab only visit in [facility] on Thursday for repeat BMP --Follow-up with Dr. Kerr on Monday as scheduled  Signed: Amponsah, Prosper M, MD 06/06/2022, 5:42 PM    

## 2022-06-06 NOTE — Discharge Instructions (Signed)
You were seen in the emergency department for low sodium.  Your sodium improved slightly with some medications here.  Internal medicine physicians evaluated you and felt you are safe for discharge.  Please increase hydrocortisone as directed by them and follow-up this week for repeat blood work.  Return to the emergency department if any worsening or concerning symptoms

## 2022-06-06 NOTE — ED Provider Notes (Signed)
[facility] EMERGENCY DEPARTMENT Provider Note   CSN: [id number] Arrival date & time: 06/06/22  1039     History  Chief Complaint  Patient presents with   low sodium    Nicholas Holt is a 41 y.o. male.  41 year old male presents with his parents for evaluation of hyponatremia.  Patient was called by his PCP told to present to the emergency room for sodium level of 118.  Patient has history of panhypopituitarism and was recently discharged.  During his recent admission patient had mild hyponatremia of around 1 27-1 28 which subsequently worsened on his hospital follow-up with PCP to 122 and yesterday 118.  Per mom patient remains at his baseline.  Denies any change in patient's mental status.  P.o. intake is at baseline.  Compliant with his home medications.  The history is provided by the patient. No language interpreter was used.       Home Medications Prior to Admission medications   Medication Sig Start Date End Date Taking? Authorizing Provider  albuterol (PROAIR HFA) 108 (90 Base) MCG/ACT inhaler Inhale 1-2 puffs into the lungs every 6 (six) hours as needed. Patient taking differently: Inhale 1-2 puffs into the lungs every 6 (six) hours as needed for shortness of breath. 12/08/15   Lucious Groves, DO  alendronate (FOSAMAX) 70 MG tablet TAKE 1 TABLET BY MOUTH ONCE A WEEK, AS DIRECTED Patient taking differently: Take 70 mg by mouth once a week. 01/19/22   Aldine Contes, MD  amLODipine (NORVASC) 5 MG tablet TAKE 1 TABLET(5 MG) BY MOUTH DAILY Patient taking differently: Take 5 mg by mouth every evening. 08/29/21   Aldine Contes, MD  Ascorbic Acid (VITAMIN C) 1000 MG tablet Take 1,000 mg by mouth daily.    [provider]  aspirin 81 MG chewable tablet Chew 1 tablet (81 mg total) by mouth daily. 05/30/22   Lacinda Axon, MD  atorvastatin (LIPITOR) 40 MG tablet Take 1 tablet (40 mg total) by mouth daily. 05/30/22   Lacinda Axon, MD   Blood Pressure Monitor KIT Please take your blood pressure daily in the morning 12/09/19   Aldine Contes, MD  cyclobenzaprine (FLEXERIL) 10 MG tablet TAKE 1 TO 2 TABLETS BY MOUTH AT BEDTIME AS NEEDED FOR MUSCLE SPASMS Patient taking differently: Take 10 mg by mouth at bedtime as needed for muscle spasms. 03/07/22   Aldine Contes, MD  desmopressin (DDAVP NASAL) 0.01 % solution USE ONE SPRAY IN EACH NOSTRIL FOUR TIMES DAILY Patient taking differently: Place 10 mcg into the nose in the morning, at noon, in the evening, and at bedtime. 12/27/21   Aldine Contes, MD  fluticasone (FLONASE) 50 MCG/ACT nasal spray Place 1 spray into both nostrils daily. Patient taking differently: Place 1 spray into both nostrils at bedtime as needed for allergies. 09/04/18   Oval Linsey, MD  hydrocortisone (CORTEF) 10 MG tablet Take 0.5 tablets (5 mg total) by mouth in the morning. 06/02/22   Gaylan Gerold, DO  levothyroxine (SYNTHROID) 100 MCG tablet Take 100 mcg by mouth daily before breakfast.    [provider]  LORazepam (ATIVAN) 2 MG tablet TAKE 3 TABLETS(6 MG) BY MOUTH AT BEDTIME Patient taking differently: Take 6 mg by mouth at bedtime. 05/02/22   Aldine Contes, MD  losartan (COZAAR) 25 MG tablet Take 1 tablet (25 mg total) by mouth daily. 05/30/22   Lacinda Axon, MD  potassium chloride (KLOR-CON) 10 MEQ tablet TAKE 2 TABLETS(20 MEQ) BY MOUTH TWICE DAILY  Patient taking differently: Take 20 mEq by mouth 2 (two) times daily. 08/15/21   Timothy Lasso, MD  Testosterone (ANDROGEL PUMP) 20.25 MG/ACT (1.62%) GEL Place 20.25 mg onto the skin daily. Apply 6 pumps 3 pumps per side) topically to shoulders or upper back once a day Patient taking differently: Place 6 Pump onto the skin at bedtime. Apply 6 pumps 3 pumps per side) topically to shoulders or upper back once a day 05/10/17   Oval Linsey, MD      Allergies    Codeine    Review of Systems   Review of Systems  Constitutional:   Negative for appetite change, chills and fever.  Gastrointestinal:  Negative for abdominal pain.  Genitourinary:  Negative for decreased urine volume, difficulty urinating and frequency.  All other systems reviewed and are negative.   Physical Exam Updated Vital Signs BP 130/76 (BP Location: Right Arm)   Pulse 86   Temp 97.9 F (36.6 C) (Oral)   Resp 16   SpO2 100%  Physical Exam Vitals and nursing note reviewed.  Constitutional:      General: He is not in acute distress.    Appearance: Normal appearance. He is not ill-appearing.  HENT:     Head: Normocephalic and atraumatic.     Nose: Nose normal.  Eyes:     General: No scleral icterus.    Extraocular Movements: Extraocular movements intact.     Conjunctiva/sclera: Conjunctivae normal.  Cardiovascular:     Rate and Rhythm: Normal rate and regular rhythm.     Pulses: Normal pulses.  Pulmonary:     Effort: Pulmonary effort is normal. No respiratory distress.     Breath sounds: Normal breath sounds. No wheezing or rales.  Abdominal:     General: There is no distension.     Tenderness: There is no abdominal tenderness.  Musculoskeletal:        General: Normal range of motion.     Cervical back: Normal range of motion.     Right lower leg: No edema.     Left lower leg: No edema.  Skin:    General: Skin is warm and dry.  Neurological:     General: No focal deficit present.     Mental Status: He is alert. Mental status is at baseline.    ED Results / Procedures / Treatments   Labs (all labs ordered are listed, but only abnormal results are displayed) Labs Reviewed  COMPREHENSIVE METABOLIC PANEL - Abnormal; Notable for the following components:      Result Value   Sodium 123 (*)    Chloride 89 (*)    All other components within normal limits  CBC WITH DIFFERENTIAL/PLATELET - Abnormal; Notable for the following components:   HCT 38.2 (*)    All other components within normal limits  URINALYSIS, ROUTINE W REFLEX  MICROSCOPIC  CBG MONITORING, ED    EKG None  Radiology No results found.  Procedures Procedures    Medications Ordered in ED Medications - No data to display  ED Course/ Medical Decision Making/ A&P                           Medical Decision Making  Medical Decision Making / ED Course   This patient presents to the ED for concern of hyponatremia, this involves an extensive number of treatment options, and is a complaint that carries with it a high risk of complications and morbidity.  The  differential diagnosis includes volume overload, dehydration, adrenal insufficiency  MDM: 41 year old male presents with his parents for evaluation of hyponatremia.  Patient was evaluated for hospital follow-up appointment at his PCP clinic when he was noted to have decline in his sodium.  He went for repeat BMP and had a further decline to 118.  He was recommended to come into the emergency room for evaluation.  Patient per mom is at his baseline.  According to mom patient's desmopressin nasal spray was increased during the hospitalization.  Unsure if this was increased by patient's medical team or by mom.  Patient without acute complications of hyponatremia.  No interventions indicated at this time.  Discussed with internal medicine team.  They will evaluate patient for admission.   Additional history obtained: -Additional history obtained from recent admission where patient was found to be mildly hyponatremic.  Mom who is at bedside -External records from outside source obtained and reviewed including: Chart review including previous notes, labs, imaging, consultation notes   Lab Tests: -I ordered, reviewed, and interpreted labs.   The pertinent results include:   Labs Reviewed  COMPREHENSIVE METABOLIC PANEL - Abnormal; Notable for the following components:      Result Value   Sodium 123 (*)    Chloride 89 (*)    All other components within normal limits  CBC WITH  DIFFERENTIAL/PLATELET - Abnormal; Notable for the following components:   HCT 38.2 (*)    All other components within normal limits  URINALYSIS, ROUTINE W REFLEX MICROSCOPIC  CBG MONITORING, ED      EKG  EKG Interpretation  Date/Time:    Ventricular Rate:    PR Interval:    QRS Duration:   QT Interval:    QTC Calculation:   R Axis:     Text Interpretation:           Medicines ordered and prescription drug management: No orders of the defined types were placed in this encounter.   -I have reviewed the patients home medicines and have made adjustments as needed  Cardiac Monitoring: The patient was maintained on a cardiac monitor.  I personally viewed and interpreted the cardiac monitored which showed an underlying rhythm of: Normal sinus rhythm  Reevaluation: After the interventions noted above, I reevaluated the patient and found that they have :stayed the same  Co morbidities that complicate the patient evaluation  Past Medical History:  Diagnosis Date   Anxiety    Attention deficit disorder 08/08/2006   Bilateral leg cramps 11/25/2006   Blood transfusion without reported diagnosis 1997   Chemotherapy associated   Cough 12/08/2015   Elevated hemidiaphragm 07/09/2015   Left, presumably after portacath placement.    H/O malignant neoplasm of brain 09/22/2014   Suprasellar germinoma treated at [facility] in 1997; received chemotherapy (cisplatin, etoposide, vincristine, and cyclophosphomide) and radiation therapy.  Complicated by left sided motor tic disorder and panhypopituitarism.    Hyperlipidemia 08/08/2006   Hypertension    Hypothyroidism    Imbalance    at risk for falls   Night sweats 02/06/2018   Obesity (BMI 30.0-34.9) 07/09/2015   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) ([place]) 08/08/2006   Following therapy for the suprasellar germinoma.  Includes central diabetes insipidus, secondary hypothyroidism, secondary adrenal  insufficiency, and secondary hypogonadism.    Perennial allergic rhinitis 07/09/2015   Pre-diabetes    no meds   Seizure disorder ([place]) 08/08/2006   Predated brain tumor.  Generalized seizure ocurred after running out of lorazepam post-tumor.  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 Treated with alendronate from 2009-2016. DEXA (03/16/2008): L Spine T -2.0. L fem neck T -0.9, R fem neck T -1.9.  DEXA (10/15/2014): L Spine T -1.3, L fem neck T -0.6, R fem neck T -1.8.  Bisphosphonate holiday 06/2015.  Repeat DEXA scan in 10/2016 to reassess.       Dispostion: Patient discussed with internal medicine team.  They will evaluate for admission.  Final Clinical Impression(s) / ED Diagnoses Final diagnoses:  Hyponatremia    Rx / DC Orders ED Discharge Orders     None         Evlyn Courier, PA-C 06/06/22 [place], Gwenyth Allegra, MD 06/06/22 1531

## 2022-06-06 NOTE — ED Provider Notes (Signed)
Received a call from internal medicine teaching service.  They were going to be admitting this patient.  His sodium is slightly improved and this is a chronic problem for patient.  They are recommending to the mother for discharge from the ED, increasing steroids and close outpatient follow-up for repeat lab work.  They called to inform me of this change in plan.  I reviewed this with patient and his parents.  They are on board with plan.  Will discharge from ED.   Hayden Rasmussen, MD 06/06/22 [phone]

## 2022-06-06 NOTE — ED Triage Notes (Signed)
Patient here after being called by MD and told that his sodium is 118-hx of same. Patient with no complaints, did have increased urination but that has resolved

## 2022-06-06 NOTE — ED Provider Triage Note (Signed)
Emergency Medicine Provider Triage Evaluation Note  Nicholas Holt , a 41 y.o. male  was evaluated in triage.  Pt complains of Per HPI sodium of 118 in office yesterday.  Patient is asymptomatic, denies any nausea, vomiting, diarrhea, dysuria, hematuria, seizures.  Slightly more lethargic according to patient's partner who is at bedside..  Review of Systems  Per HPI  Physical Exam  BP 130/76 (BP Location: Right Arm)   Pulse 86   Temp 97.9 F (36.6 C) (Oral)   Resp 16   SpO2 100%  Gen:   Awake, no distress   Resp:  Normal effort  MSK:   Moves extremities without difficulty  Other:    Medical Decision Making  Medically screening exam initiated at 12:01 PM.  Appropriate orders placed.  [name] was informed that the remainder of the evaluation will be completed by another provider, this initial triage assessment does not replace that evaluation, and the importance of remaining in the ED until their evaluation is complete.     Sherrill Raring, PA-C 06/06/22 1202

## 2022-06-06 NOTE — Telephone Encounter (Signed)
Dr. Alfonse Spruce has spoken with the patient's mother and advised to bring the patient to the ED given critical sodium value. I agree with this recommendation.

## 2022-06-06 NOTE — ED Notes (Signed)
Patient verbalizes understanding of discharge instructions. Opportunity for questioning and answers were provided. Armband removed by staff, pt discharged from ED.  

## 2022-06-06 NOTE — Telephone Encounter (Signed)
Call from [employer].  Abnormal Sodium level 118 on lab work done on 06/05/2022.

## 2022-06-07 ENCOUNTER — Ambulatory Visit: Payer: Medicare Other | Admitting: Physical Therapy

## 2022-06-07 ENCOUNTER — Other Ambulatory Visit: Payer: Self-pay | Admitting: Internal Medicine

## 2022-06-07 DIAGNOSIS — R252 Cramp and spasm: Secondary | ICD-10-CM

## 2022-06-07 DIAGNOSIS — G40909 Epilepsy, unspecified, not intractable, without status epilepticus: Secondary | ICD-10-CM

## 2022-06-07 DIAGNOSIS — F4322 Adjustment disorder with anxiety: Secondary | ICD-10-CM

## 2022-06-07 DIAGNOSIS — E871 Hypo-osmolality and hyponatremia: Secondary | ICD-10-CM

## 2022-06-07 NOTE — Assessment & Plan Note (Signed)
Patient discharged from the ED after he was re-evaluated by IMT[facility]. Hyponatremia had improved from 118 on 7/24 to 123 after presenting to the ER on 7/25. Patient remained asymptomatic. Hyponatremia likely due to his adrenal insufficiency and getting lower than the recommended steroid dose. Patient instructed to come for a lab only visit on Thursday, 7/27. See Dr. Monte Fantasia consult note on 7/25 for more details.   --Repeat BMP on 06/08/22 --Increase hydrocortisone from 5 mg to 10 mg each morning -Follow up with Dr. Buddy Duty, endocrinologist on Monday, 7/31

## 2022-06-08 ENCOUNTER — Other Ambulatory Visit (INDEPENDENT_AMBULATORY_CARE_PROVIDER_SITE_OTHER): Payer: Medicare Other

## 2022-06-08 ENCOUNTER — Encounter: Payer: Self-pay | Admitting: Physical Therapy

## 2022-06-08 ENCOUNTER — Ambulatory Visit: Payer: Medicare Other | Admitting: Physical Therapy

## 2022-06-08 ENCOUNTER — Telehealth: Payer: Self-pay | Admitting: Internal Medicine

## 2022-06-08 DIAGNOSIS — R29818 Other symptoms and signs involving the nervous system: Secondary | ICD-10-CM | POA: Diagnosis not present

## 2022-06-08 DIAGNOSIS — E871 Hypo-osmolality and hyponatremia: Secondary | ICD-10-CM

## 2022-06-08 DIAGNOSIS — R2689 Other abnormalities of gait and mobility: Secondary | ICD-10-CM

## 2022-06-08 DIAGNOSIS — R531 Weakness: Secondary | ICD-10-CM | POA: Diagnosis not present

## 2022-06-08 DIAGNOSIS — M6281 Muscle weakness (generalized): Secondary | ICD-10-CM | POA: Diagnosis not present

## 2022-06-08 DIAGNOSIS — R2681 Unsteadiness on feet: Secondary | ICD-10-CM | POA: Diagnosis not present

## 2022-06-08 DIAGNOSIS — Z9181 History of falling: Secondary | ICD-10-CM | POA: Diagnosis not present

## 2022-06-08 LAB — BASIC METABOLIC PANEL
Anion gap: 11 (ref 5–15)
BUN: 9 mg/dL (ref 6–20)
CO2: 25 mmol/L (ref 22–32)
Calcium: 8.8 mg/dL — ABNORMAL LOW (ref 8.9–10.3)
Chloride: 86 mmol/L — ABNORMAL LOW (ref 98–111)
Creatinine, Ser: 0.79 mg/dL (ref 0.61–1.24)
GFR, Estimated: >60 mL/min (ref >60)
Glucose, Bld: 109 mg/dL — ABNORMAL HIGH (ref 70–99)
Potassium: 3.3 mmol/L — ABNORMAL LOW (ref 3.5–5.1)
Sodium: 122 mmol/L — ABNORMAL LOW (ref 135–145)

## 2022-06-08 NOTE — Therapy (Signed)
OUTPATIENT PHYSICAL THERAPY NEURO EVALUATION   Patient Name: Nicholas Holt MRN: [id number] DOB:Mar 31, 1981, 41 y.o., male Today's Date: 06/09/2022   PCP: Aldine Contes, MD REFERRING PROVIDER: Axel Filler, MD   PT End of Session - 06/08/22 1108     Visit Number 2    Number of Visits 17    Date for PT Re-Evaluation 08/05/22    Authorization Type Medicare - Part A & B    PT Start Time 1104    PT Stop Time 1145    PT Time Calculation (min) 41 min    Equipment Utilized During Treatment Gait belt;Other (comment)   various AFO's   Activity Tolerance Patient tolerated treatment well    Behavior During Therapy [facility] for tasks assessed/performed;Impulsive             Past Medical History:  Diagnosis Date   Anxiety    Attention deficit disorder 08/08/2006   Bilateral leg cramps 11/25/2006   Blood transfusion without reported diagnosis 1997   Chemotherapy associated   Cough 12/08/2015   Elevated hemidiaphragm 07/09/2015   Left, presumably after portacath placement.    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  Complicated by left sided motor tic disorder and panhypopituitarism.    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  Includes central diabetes insipidus, secondary hypothyroidism, secondary adrenal insufficiency, and secondary hypogonadism.    Perennial allergic rhinitis 07/09/2015   Pre-diabetes    no meds   Seizure disorder ([place]) 08/08/2006   Predated brain tumor.  Generalized seizure ocurred after running out of lorazepam post-tumor.    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 Treated with alendronate from 2009-2016. DEXA (03/16/2008): L Spine T -2.0. L fem neck T -0.9, R fem neck T -1.9.  DEXA (10/15/2014): L Spine T -1.3, L fem neck T -0.6, R fem neck T -1.8.  Bisphosphonate holiday 06/2015.  Repeat DEXA scan in 10/2016 to reassess.    Past Surgical History:  Procedure Laterality Date   BRAIN BIOPSY  11/14/1995   CSF SHUNT  11/14/1995   PORTACATH PLACEMENT     portacath removal     RADIOLOGY WITH ANESTHESIA N/A 08/30/2021   Procedure: MRI WITH ANESTHESIA BRAIN WITH AND WITHOUT CONTRAST;  Surgeon: Radiologist, Medication, MD;  Location: [place];  Service: Radiology;  Laterality: N/A;   RADIOLOGY WITH ANESTHESIA N/A 05/25/2022   Procedure: MRI WITH ANESTHESIA OF BRAIN WITH AND WITHOUT CONTRAST;  Surgeon: Radiologist, Medication, MD;  Location: [place];  Service: Radiology;  Laterality: N/A;   Patient Active Problem List   Diagnosis Date Noted   Hyponatremia 05/30/2022   Multiple lacunar infarcts [facility]) 05/04/2022   Fall 04/13/2022   Healthcare maintenance 08/15/2021   AKI (acute kidney injury) ([place]) 01/11/2021   Hypertension 12/09/2019   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   Elevated hemidiaphragm 07/09/2015   Obesity (BMI 30.0-34.9) 07/09/2015   Perennial allergic rhinitis 07/09/2015   Preventative health care 07/09/2015   H/O malignant neoplasm of brain 09/22/2014   Steroid-induced osteoporosis 03/27/2008   Bilateral leg cramps 11/25/2006   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 male with history of suprasellar germinoma at age 1 with LUE/LLE hemiparesis (patient has been cared for by his mother since this time and has had persistent gait difficulties and limitation of left arm ROM), HTN, HLD, anxiety, ADD, chronic ataxia. Patient walks on his toes at baseline and has been more unsteady than usual over the past few weeks.  Pt was directly admitted  on 05/29/22 for work-up of acutely worsening balance and ambulation with recent MRI showeing evidence of prior hemorrhages and lacunar infarcts in bilateral basal ganglia and cerebellum   PRECAUTIONS: Fall   PATIENT GOALS: Be able to bowl again every Saturday morning    SUBJECTIVE: no new complaints. No falls per pt report.                                                                                                                                                                                            Pt accompanied by: self and family member, mom and dad in lobby [place]:  Are you having pain? No    OBJECTIVE:   COGNITION: Overall cognitive status: Within functional limits for tasks assessed     TODAY'S TREATMENT:  [facility] PT Assessment - 06/08/22 1111       Berg Balance Test   Sit to Stand Able to stand  independently using hands    Standing Unsupported Able to stand safely 2 minutes    Sitting with Back Unsupported but Feet Supported on Floor or Stool Able to sit safely and securely 2 minutes    Stand to Sit Sits safely with minimal use of hands    Transfers Able to transfer safely, definite need of hands    Standing Unsupported with Eyes Closed Able to stand 10 seconds safely    Standing Unsupported with Feet Together Able to place feet together independently and stand for 1 minute with supervision   pt trying to reach out for computer for balance   From Standing,  Reach Forward with Outstretched Arm Reaches forward but needs supervision   started here 06/08/22   From Standing Position, Pick up Object from Floor Able to pick up shoe, needs supervision  From Standing Position, Turn to Look Behind Over each Shoulder Turn sideways only but maintains balance   right>left side   Turn 360 Degrees Able to turn 360 degrees safely but slowly    Standing Unsupported, Alternately Place Feet on Step/Stool Able to complete >2 steps/needs minimal assist    Standing Unsupported, One Foot in Front Needs help to step but can hold 15 seconds   can step w/o assist, can not hold >20 seconds   Standing on One Leg Tries to lift leg/unable to hold 3 seconds but remains standing independently    Total Score 36    Berg comment: <36 high risk for falls               GAIT: Gait pattern: step to pattern, step through pattern, decreased step length- Right, decreased step length- Left, and decreased stride length, stays on left Distance walked: 115 x 2, plus around gym Assistive device utilized: Walker - 2 wheeled and anterior [place], anterior [place]ne Level of assistance: CGA and Min A Comments: trial of different AFO's and use of RW to improve gait mechanics and balance. Pt able to keep foot flat (no toe walking) with use of AFO with greater improvement noted with use of Thuasne brace. Pt has already had a consult at [employer]. Will reach out to [employer] to see what brace they are looking at for him to use.     PATIENT EDUCATION: Education details: results of Furniture conservator/restorer Person educated: Patient and Parent Education method: Explanation Education comprehension: verbalized understanding   HOME EXERCISE PROGRAM: Will provide at next session.     GOALS: Goals reviewed with patient? Yes  SHORT TERM GOALS: Target date: 07/04/2022  Pt will be independent with initial HEP with family supervision in order to build upon functional gains made in  therapy. Baseline: Goal status: INITIAL  2.  Pt will finish assessment of BERG with LTG written. Baseline:  Goal status: INITIAL  3.  Pt will improve 5x sit<>stand to less than or equal to 19 sec with BUE support to demonstrate improved functional strength and transfer efficiency.  Baseline: 23.66 seconds with BUE support Goal status: INITIAL  4.  Pt will improve gait speed with no AD vs. LRAD to at least 2.1 ft/sec in order to demo improved community mobility.  Baseline: 1.88 ft/sec with no AD  Goal status: INITIAL  5.  Pt will improve TUG time to 22 seconds or less with no AD vs. LRAD in order to demo decrease fall risk. Baseline: 26.6 seconds no AD with min A Goal status: INITIAL   LONG TERM GOALS: Target date: 08/01/2022  Pt will be independent with final HEP with family supervision in order to build upon functional gains made in therapy. Baseline:  Goal status: INITIAL  2.  BERG goal to be written as appropriate to demo decr fall risk. Baseline: 06/08/22: 36/56 scored as baseline Goal status: INITIAL  3.  Pt will improve TUG time to 22 seconds or less with no AD vs. LRAD in order to demo decrease fall risk. Baseline: 26.6 seconds no AD with min A Goal status: INITIAL  4.  Pt will go up and down 4 steps with step to pattern and supervision with no handrail in order to safely enter/exit home. Baseline: Stairs not assessed - pt has 3 steps to get in and out without handrails Goal status: INITIAL  5.  Pt will improve gait speed with no AD vs. LRAD to at least 2.4  ft/sec in order to demo improved community mobility.  Baseline: 1.88 ft/sec with no AD  Goal status: INITIAL  ASSESSMENT:  CLINICAL IMPRESSION: Today's skilled session focused on completion of Berg Balance test with baselines updated. Remainder of session focused on trial of various braces to left LE working on getting pt off toes with gait. Pt has been referred already to [facility] clinic and had consult there.  Will reach out to see what they are looking into for him to use. The pt is making steady progress   OBJECTIVE IMPAIRMENTS Abnormal gait, decreased activity tolerance, decreased balance, decreased coordination, decreased endurance, decreased knowledge of use of DME, decreased mobility, difficulty walking, decreased ROM, decreased strength, decreased safety awareness, impaired flexibility, impaired tone, impaired UE functional use, and postural dysfunction.   ACTIVITY LIMITATIONS carrying, standing, stairs, transfers, bathing, and locomotion level  PARTICIPATION LIMITATIONS: meal prep, cleaning, driving, community activity, and bowling  PERSONAL FACTORS Behavior pattern, Past/current experiences, Time since onset of injury/illness/exacerbation, and 3+ comorbidities: suprasellar germinoma status post chemo and radiation therapy at age 7 with LUE/LLE hemiparesis (patient has been cared for by his mother since this time and has had persistent gait difficulties and limitation of left arm ROM), HTN, HLD, anxiety, ADD, chronic ataxia.   are also affecting patient's functional outcome.   REHAB POTENTIAL: Fair due to chronicity of condition, pt has not received PT before.  CLINICAL DECISION MAKING: Evolving/moderate complexity  EVALUATION COMPLEXITY: Moderate  PLAN: PT FREQUENCY: 2x/week  PT DURATION: 8 weeks  PLANNED INTERVENTIONS: Therapeutic exercises, Therapeutic activity, Neuromuscular re-education, Balance training, Gait training, Patient/Family education, Self Care, Stair training, Vestibular training, Orthotic/Fit training, DME instructions, and Manual therapy  PLAN FOR NEXT SESSION:  Initial HEP for balance, strengthening, ROM esp L hamstring and ankle. Continue with use of walker and use of Thuasne AFO until pt can get his custom thermoplastic AFO from [employer] (call placed and discussed with [place]).      Willow Ora, PTA, [place] [address], [place], [place] [address] [phone] 06/09/22, 11:31 AM

## 2022-06-08 NOTE — Telephone Encounter (Signed)
I called to speak with the patient's mother about his BMP results.  Patient sodium is stable at 122.  He was noted to be mildly hypokalemic at 3.3.  Patient's mother states that she was told to hold his potassium supplements as it was high recently.  Patient's mother instructed to give 20 mg of potassium today and tomorrow and to follow-up with Dr. Buddy Duty on Monday for repeat blood work and possible adjusting of his medications.  Patient's hydrocortisone was increased to 10 mg by the inpatient team.  I instructed the mother to continue this dose till she follows up with Dr. Buddy Duty on Monday.  Our team had also reached out to Dr. Buddy Duty regarding his hyponatremia and he recommended that the patient drink water only when he is thirsty.  Because the patient is on desmopressin if he drinks excess water he may end up retaining more and this may be causing his hyponatremia.  Mother expressed understanding and is in agreement with plan.  No further work-up at this time.

## 2022-06-12 ENCOUNTER — Ambulatory Visit: Payer: Medicare Other | Admitting: Physical Therapy

## 2022-06-12 ENCOUNTER — Encounter: Payer: Self-pay | Admitting: Physical Therapy

## 2022-06-12 DIAGNOSIS — R29818 Other symptoms and signs involving the nervous system: Secondary | ICD-10-CM | POA: Diagnosis not present

## 2022-06-12 DIAGNOSIS — E232 Diabetes insipidus: Secondary | ICD-10-CM | POA: Diagnosis not present

## 2022-06-12 DIAGNOSIS — Z9181 History of falling: Secondary | ICD-10-CM | POA: Diagnosis not present

## 2022-06-12 DIAGNOSIS — R531 Weakness: Secondary | ICD-10-CM | POA: Diagnosis not present

## 2022-06-12 DIAGNOSIS — M6281 Muscle weakness (generalized): Secondary | ICD-10-CM | POA: Diagnosis not present

## 2022-06-12 DIAGNOSIS — R7309 Other abnormal glucose: Secondary | ICD-10-CM | POA: Diagnosis not present

## 2022-06-12 DIAGNOSIS — R2689 Other abnormalities of gait and mobility: Secondary | ICD-10-CM

## 2022-06-12 DIAGNOSIS — E669 Obesity, unspecified: Secondary | ICD-10-CM | POA: Diagnosis not present

## 2022-06-12 DIAGNOSIS — R2681 Unsteadiness on feet: Secondary | ICD-10-CM

## 2022-06-12 DIAGNOSIS — R748 Abnormal levels of other serum enzymes: Secondary | ICD-10-CM | POA: Diagnosis not present

## 2022-06-12 DIAGNOSIS — D649 Anemia, unspecified: Secondary | ICD-10-CM | POA: Diagnosis not present

## 2022-06-12 DIAGNOSIS — E230 Hypopituitarism: Secondary | ICD-10-CM | POA: Diagnosis not present

## 2022-06-12 NOTE — Therapy (Signed)
OUTPATIENT PHYSICAL THERAPY NEURO EVALUATION   Patient Name: [facility] MRN: [id number] DOB:January 02, 1981, 41 y.o., male Today's Date: 06/12/2022   PCP: Nicholas Contes, MD REFERRING PROVIDER: Axel Filler, MD   PT End of Session - 06/12/22 1402     Visit Number 3    Number of Visits 17    Date for PT Re-Evaluation 08/05/22    Authorization Type Medicare - Part A & B    PT Start Time 1402    PT Stop Time 4580    PT Time Calculation (min) 43 min    Equipment Utilized During Treatment Gait belt;Other (comment)   AFO   Activity Tolerance Patient tolerated treatment well    Behavior During Therapy [facility] for tasks assessed/performed;Impulsive             Past Medical History:  Diagnosis Date   Anxiety    Attention deficit disorder 08/08/2006   Bilateral leg cramps 11/25/2006   Blood transfusion without reported diagnosis 1997   Chemotherapy associated   Cough 12/08/2015   Elevated hemidiaphragm 07/09/2015   Left, presumably after portacath placement.    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  Complicated by left sided motor tic disorder and panhypopituitarism.    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  Includes central diabetes insipidus, secondary hypothyroidism, secondary adrenal insufficiency, and secondary hypogonadism.    Perennial allergic rhinitis 07/09/2015   Pre-diabetes    no meds   Seizure disorder ([facility]) 08/08/2006   Predated brain tumor.  Generalized seizure ocurred after running out of lorazepam post-tumor.    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 Treated with alendronate from 2009-2016. DEXA (03/16/2008): L Spine T -2.0. L fem neck T -0.9, R fem neck T -1.9.  DEXA (10/15/2014): L Spine T -1.3, L fem neck T -0.6, R fem neck T -1.8.  Bisphosphonate holiday 06/2015.  Repeat DEXA scan in 10/2016 to reassess.    Past Surgical History:  Procedure Laterality Date   BRAIN BIOPSY  11/14/1995   CSF SHUNT  11/14/1995   PORTACATH PLACEMENT     portacath removal     RADIOLOGY WITH ANESTHESIA N/A 08/30/2021   Procedure: MRI WITH ANESTHESIA BRAIN WITH AND WITHOUT CONTRAST;  Surgeon: Radiologist, Medication, MD;  Location: [facility];  Service: Radiology;  Laterality: N/A;   RADIOLOGY WITH ANESTHESIA N/A 05/25/2022   Procedure: MRI WITH ANESTHESIA OF BRAIN WITH AND WITHOUT CONTRAST;  Surgeon: Radiologist, Medication, MD;  Location: [facility];  Service: Radiology;  Laterality: N/A;   Patient Active Problem List   Diagnosis Date Noted   Hyponatremia 05/30/2022   Multiple lacunar infarcts [facility]) 05/04/2022   Fall 04/13/2022   Healthcare maintenance 08/15/2021   AKI (acute kidney injury) ([place]) 01/11/2021   Hypertension 12/09/2019   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   Elevated hemidiaphragm 07/09/2015   Obesity (BMI 30.0-34.9) 07/09/2015   Perennial allergic rhinitis 07/09/2015   Preventative health care 07/09/2015   H/O malignant neoplasm of brain 09/22/2014   Steroid-induced osteoporosis 03/27/2008   Bilateral leg cramps 11/25/2006   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 male with history of suprasellar germinoma at age 59 with LUE/LLE hemiparesis (patient has been cared for by his mother since this time and has had persistent gait difficulties and limitation of left arm ROM), HTN, HLD, anxiety, ADD, chronic ataxia. Patient walks on his toes at baseline and has been more unsteady than usual over the past few weeks.  Pt was directly admitted  on 05/29/22 for work-up of acutely worsening balance and ambulation with recent MRI showeing evidence of prior hemorrhages and lacunar infarcts in bilateral basal ganglia and cerebellum   PRECAUTIONS: Fall   PATIENT GOALS: Be able to bowl again every Saturday morning    SUBJECTIVE: Patient reports doing well- no falls. Has not been seen by [facility]                                                                                                                                                                                             Pt accompanied by: self and family member, mom and dad in lobby [place]:  Are you having pain? No     TODAY'S TREATMENT: Gait:  -115' [place]: Continued primary L toe contact with shortened L step length, poor foot clearance, decreased trunk rotation and reciprocal arm swing, increased L knee flexion in stance   -115' [place] AFO: same deficits as above (patient will likely need a custom AFO)   -Treadmill 0.8-1.15mh trialing [place] vs [place] AFO ([facility] for custom AFO needs)  -agility ladder to encourage longer and equal step length   -blocked practice L LE stepping over balance beam with U UE support  -Treadmill 1.040m MaxA-TotalA to advance L LE through full appropriate swing; unable to achieve full knee extension in stance-> kicking ball to facilitate full reciprocal stepping (not  successful despite Max cuing and demonstration)     PATIENT EDUCATION: Education details: different AFOs, appropriate gait pattern  Person educated: Patient and Parent Education method: Explanation Education comprehension: verbalized understanding   HOME EXERCISE PROGRAM: Will provide at next session.     GOALS: Goals reviewed with patient? Yes  SHORT TERM GOALS: Target date: 07/04/2022  Pt will be independent with initial HEP with family supervision in order to build upon functional gains made in therapy. Baseline: Goal status: INITIAL  2.  Pt will finish assessment of BERG with LTG written. Baseline:  Goal status: INITIAL  3.  Pt will improve 5x sit<>stand to less than or equal to 19 sec with BUE support to demonstrate improved functional strength and transfer efficiency.  Baseline: 23.66 seconds with BUE support Goal status: INITIAL  4.  Pt will improve gait speed with no AD vs. LRAD to at least 2.1 ft/sec in order to demo improved community mobility.  Baseline: 1.88 ft/sec with no AD  Goal status: INITIAL  5.  Pt will improve TUG time to 22 seconds or less with no AD vs. LRAD in order to demo decrease fall risk. Baseline: 26.6 seconds no AD with min A Goal status: INITIAL   LONG TERM GOALS: Target date: 08/01/2022  Pt will be independent with final HEP with family supervision in order to build upon functional gains made in therapy. Baseline:  Goal status: INITIAL  2.  BERG goal to be written as appropriate to demo decr fall risk. Baseline: 06/08/22: 36/56 scored as baseline Goal status: INITIAL  3.  Pt will improve TUG time to 22 seconds or less with no AD vs. LRAD in order to demo decrease fall risk. Baseline: 26.6 seconds no AD with min A Goal status: INITIAL  4.  Pt will go up and down 4 steps with step to pattern and supervision with no handrail in order to safely enter/exit home. Baseline: Stairs not assessed - pt has 3 steps to get in and out without  handrails Goal status: INITIAL  5.  Pt will improve gait speed with no AD vs. LRAD to at least 2.4 ft/sec in order to demo improved community mobility.  Baseline: 1.88 ft/sec with no AD  Goal status: INITIAL  ASSESSMENT:  CLINICAL IMPRESSION: Patient seen for skilled PT session with emphasis on gait retraining and L hemibody NMR. He persists with abnormal gait mechanics increasing his risk for falling by tripping over his L toes. Patient is minimally receptive to verbal and tactile cuing as well as consistent demonstration. He would benefit from custom AFO in order to help improve his gait mechanics and limit his risk for falling. Continue POC.   OBJECTIVE IMPAIRMENTS Abnormal gait, decreased activity tolerance, decreased balance, decreased coordination, decreased endurance, decreased knowledge of use of DME, decreased mobility, difficulty walking, decreased ROM, decreased strength, decreased safety awareness, impaired flexibility, impaired tone, impaired UE functional use, and postural dysfunction.   ACTIVITY LIMITATIONS carrying, standing, stairs, transfers, bathing, and locomotion level  PARTICIPATION LIMITATIONS: meal prep, cleaning, driving, community activity, and bowling  PERSONAL FACTORS Behavior pattern, Past/current experiences, Time since onset of injury/illness/exacerbation, and 3+ comorbidities: suprasellar germinoma status post chemo and radiation therapy at age 59 with LUE/LLE hemiparesis (patient has been cared for by his mother since this time and has had persistent gait difficulties and limitation of left arm ROM), HTN, HLD, anxiety, ADD, chronic ataxia.   are also affecting patient's functional outcome.   REHAB POTENTIAL: Fair due to chronicity of condition, pt has not received PT before.  CLINICAL DECISION MAKING: Evolving/moderate complexity  EVALUATION COMPLEXITY: Moderate  PLAN: PT FREQUENCY: 2x/week  PT DURATION: 8 weeks  PLANNED INTERVENTIONS: Therapeutic  exercises, Therapeutic activity, Neuromuscular re-education, Balance training, Gait training, Patient/Family education, Self Care, Stair training, Vestibular training, Orthotic/Fit training, DME instructions, and Manual therapy  PLAN FOR NEXT  SESSION:  Initial HEP for balance, strengthening, ROM esp L hamstring and ankle; did [facility] respond?; trialing AFOs, gait mechanics- anything for carryover of longer and equal step length with L heel contact   Debbora Dus, PT, DPT, CBIS  06/12/22, 2:54 PM

## 2022-06-14 ENCOUNTER — Ambulatory Visit: Payer: Medicare Other | Admitting: Physical Therapy

## 2022-06-15 ENCOUNTER — Encounter: Payer: Self-pay | Admitting: Diagnostic Neuroimaging

## 2022-06-15 ENCOUNTER — Ambulatory Visit (INDEPENDENT_AMBULATORY_CARE_PROVIDER_SITE_OTHER): Payer: Medicare Other | Admitting: Diagnostic Neuroimaging

## 2022-06-15 ENCOUNTER — Ambulatory Visit
Payer: Medicare Other | Attending: Student in an Organized Health Care Education/Training Program | Admitting: Physical Therapy

## 2022-06-15 VITALS — BP 137/77 | HR 94 | Ht 68.0 in | Wt 204.0 lb

## 2022-06-15 DIAGNOSIS — R2689 Other abnormalities of gait and mobility: Secondary | ICD-10-CM | POA: Insufficient documentation

## 2022-06-15 DIAGNOSIS — Z9181 History of falling: Secondary | ICD-10-CM | POA: Insufficient documentation

## 2022-06-15 DIAGNOSIS — M6281 Muscle weakness (generalized): Secondary | ICD-10-CM | POA: Insufficient documentation

## 2022-06-15 DIAGNOSIS — I6381 Other cerebral infarction due to occlusion or stenosis of small artery: Secondary | ICD-10-CM | POA: Diagnosis not present

## 2022-06-15 DIAGNOSIS — R531 Weakness: Secondary | ICD-10-CM | POA: Diagnosis not present

## 2022-06-15 DIAGNOSIS — R2681 Unsteadiness on feet: Secondary | ICD-10-CM | POA: Insufficient documentation

## 2022-06-15 NOTE — Progress Notes (Signed)
[facility]  PATIENT: Nicholas Holt DOB: 1981/08/27  REFERRING CLINICIAN: Earl Lagos, MD HISTORY FROM: patient  REASON FOR VISIT: new consult    HISTORICAL  CHIEF COMPLAINT:  Chief Complaint  Patient presents with   Multiple falls, ataxia    Rm 7 New Pt   Mom- [place]  "recent MRI showed cancer hasn't come back but he is having mini strokes; just started PT for falls, walking on toes and holding onto walls"    HISTORY OF PRESENT ILLNESS:   41 year old male here for evaluation of gait and balance difficulty.  Patient had normal birth and development.  Had some seizures in early childhood, managed with medication, but that he outgrew these.  Age 37 years old patient had increasing vomiting and increasing thirst.  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  He was treated with radiation and chemotherapy including cisplatin vincristine etoposide and cyclophosphamide.  Soon after treatment he developed left arm and left leg weakness, increased tone and hemiparesis.  He was able to return to high school and complete although he had some focus and attention difficulties requiring stimulants.  Generally patient was stable over many years although some tendency to walk on toes developed over time.  Mother was primary caregiver.   October 2022 had some worsening memory loss and had MRI of the brain.  At that time some interval microvascular ischemic changes were noted compared to 2008.   May 2023 patient started having increasing gait and balance difficulties and falls.  Had evaluation lab testing and was noted to have slightly elevated CK levels, although this was noted 10 years prior as well.  Increasing fluid intake was encouraged.  Symptoms continue to worsen.  Patient had MRI of the brain with general anesthesia on 05/25/22 which showed no acute findings but showed some slight progression of microvascular ischemic changes.  Due to  this patient was admitted to the hospital for further evaluation on 05/29/22. Once in the hospital he was noted to have hyponatremia.  This was felt to be related to chronic adrenal insufficiency and water restriction was encouraged.  Unfortunately patient's mother continue to increase fluid intake because she thought he was dehydrated and that he needs more water.  Since that time sodium levels have continued to drop down to 118.  Now he is seeing endocrinology, following fluid restriction, and sodium levels are slightly improving.   REVIEW OF SYSTEMS: Full 14 system review of systems performed and negative with exception of: as per HPI.  ALLERGIES: Allergies  Allergen Reactions   Codeine Other (See Comments)    Shaking     HOME MEDICATIONS: Outpatient Medications Prior to Visit  Medication Sig Dispense Refill   alendronate (FOSAMAX) 70 MG tablet TAKE 1 TABLET BY MOUTH ONCE A WEEK, AS DIRECTED (Patient taking differently: Take 70 mg by mouth once a week.) 4 tablet 11   amLODipine (NORVASC) 5 MG tablet TAKE 1 TABLET(5 MG) BY MOUTH DAILY (Patient taking differently: Take 5 mg by mouth every evening.) 90 tablet 3   Ascorbic Acid (VITAMIN C) 1000 MG tablet Take 1,000 mg by mouth daily.     aspirin 81 MG chewable tablet Chew 1 tablet (81 mg total) by mouth daily. 30 tablet 5   atorvastatin (LIPITOR) 40 MG tablet Take 1 tablet (40 mg total) by mouth daily. 30 tablet 5   Blood Pressure Monitor KIT Please take your blood pressure daily in the morning 1 kit 0  Coenzyme Q10 (COQ10) 100 MG CAPS Take 100 mg by mouth in the morning.     cyclobenzaprine (FLEXERIL) 10 MG tablet Take 1 tablet (10 mg total) by mouth at bedtime as needed for muscle spasms. 90 tablet 0   desmopressin (DDAVP NASAL) 0.01 % solution USE ONE SPRAY IN EACH NOSTRIL FOUR TIMES DAILY (Patient taking differently: Place 10 mcg into the nose in the morning, at noon, in the evening, and at bedtime.) 70 mL 3   hydrocortisone (CORTEF) 10 MG  tablet Take 1 tablet (10 mg total) by mouth in the morning. 30 tablet 5   levothyroxine (SYNTHROID) 100 MCG tablet Take 100 mcg by mouth daily before breakfast.     LORazepam (ATIVAN) 2 MG tablet TAKE 3 TABLETS(6 MG) BY MOUTH AT BEDTIME 90 tablet 0   losartan (COZAAR) 25 MG tablet Take 1 tablet (25 mg total) by mouth daily. 30 tablet 5   Menaquinone-7 (VITAMIN K2 PO) Take 1 capsule by mouth in the morning.     potassium chloride (KLOR-CON) 10 MEQ tablet TAKE 2 TABLETS(20 MEQ) BY MOUTH TWICE DAILY (Patient taking differently: Take 20 mEq by mouth 2 (two) times daily.) 360 tablet 3   Testosterone (ANDROGEL PUMP) 20.25 MG/ACT (1.62%) GEL Place 20.25 mg onto the skin daily. Apply 6 pumps 3 pumps per side) topically to shoulders or upper back once a day (Patient taking differently: Place 6 Pump onto the skin at bedtime. Apply 6 pumps 3 pumps per side) topically to shoulders or upper back once a day) 75 g 3   No facility-administered medications prior to visit.    PAST MEDICAL HISTORY: Past Medical History:  Diagnosis Date   Anxiety    Attention deficit disorder 08/08/2006   Bilateral leg cramps 11/25/2006   Blood transfusion without reported diagnosis 1997   Chemotherapy associated   Cough 12/08/2015   Elevated hemidiaphragm 07/09/2015   Left, presumably after portacath placement.    H/O malignant neoplasm of brain 09/22/2014   Suprasellar germinoma treated at [facility] in 1997; received chemotherapy (cisplatin, etoposide, vincristine, and cyclophosphomide) and radiation therapy.  Complicated by left sided motor tic disorder and panhypopituitarism.    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  Includes central diabetes insipidus, secondary  hypothyroidism, secondary adrenal insufficiency, and secondary hypogonadism.    Perennial allergic rhinitis 07/09/2015   Pre-diabetes    no meds   Seizure disorder ([place]) 08/08/2006   Predated brain tumor.  Generalized seizure ocurred after running out of lorazepam post-tumor.    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 Treated with alendronate from 2009-2016. DEXA (03/16/2008): L Spine T -2.0. L fem neck T -0.9, R fem neck T -1.9.  DEXA (10/15/2014): L Spine T -1.3, L fem neck T -0.6, R fem neck T -1.8.  Bisphosphonate holiday 06/2015.  Repeat DEXA scan in 10/2016 to reassess.     PAST SURGICAL HISTORY: Past Surgical History:  Procedure Laterality Date   BRAIN BIOPSY  11/14/1995   CSF SHUNT  11/14/1995   PORTACATH PLACEMENT     portacath removal     RADIOLOGY WITH ANESTHESIA N/A 08/30/2021   Procedure: MRI WITH ANESTHESIA BRAIN WITH AND WITHOUT CONTRAST;  Surgeon: Radiologist, Medication, MD;  Location: [place];  Service:  Radiology;  Laterality: N/A;   RADIOLOGY WITH ANESTHESIA N/A 05/25/2022   Procedure: MRI WITH ANESTHESIA OF BRAIN WITH AND WITHOUT CONTRAST;  Surgeon: Radiologist, Medication, MD;  Location: [place];  Service: Radiology;  Laterality: N/A;    FAMILY HISTORY: Family History  Problem Relation Age of Onset   Hypertension Mother    Hypertension Father    Hyperlipidemia Father    Prostate cancer Father    Diabetes type I Sister        Died of complications of diabetes in her 39's    SOCIAL HISTORY: Social History   Socioeconomic History   Marital status: Single    Spouse name: Not on file   Number of children: Not on file   Years of education: Not on file   Highest education level: Not on file  Occupational History   Not on file  Tobacco Use   Smoking status: Never   Smokeless tobacco: Never  Vaping Use   Vaping Use: Never used  Substance and Sexual Activity   Alcohol use: No    Alcohol/week: 0.0 standard drinks of alcohol    Drug use: No   Sexual activity: Not on file  Other Topics Concern   Not on file  Social History Narrative   Lives with parents.  Parents retired. Father or mother take him to appointments.  Mother sends notes if she can not take him.   Social Determinants of Health   Financial Resource Strain: Low Risk  (03/17/2022)   Overall Financial Resource Strain (CARDIA)    Difficulty of Paying Living Expenses: Not hard at all  Food Insecurity: No Food Insecurity (03/17/2022)   Hunger Vital Sign    Worried About Running Out of Food in the Last Year: Never true    Ran Out of Food in the Last Year: Never true  Transportation Needs: No Transportation Needs (03/17/2022)   PRAPARE - Hydrologist (Medical): No    Lack of Transportation (Non-Medical): No  Physical Activity: Sufficiently Active (03/17/2022)   Exercise Vital Sign    Days of Exercise per Week: 5 days    Minutes of Exercise per Session: 60 min  Stress: No Stress Concern Present (03/17/2022)   [place]    Feeling of Stress : Not at all  Social Connections: Socially Isolated (03/17/2022)   Social Connection and Isolation Panel [NHANES]    Frequency of Communication with Friends and Family: More than three times a week    Frequency of Social Gatherings with Friends and Family: More than three times a week    Attends Religious Services: Never    Marine scientist or Organizations: No    Attends Archivist Meetings: Never    Marital Status: Never married  Intimate Partner Violence: Not At Risk (03/17/2022)   Humiliation, Afraid, Rape, and Kick questionnaire    Fear of Current or Ex-Partner: No    Emotionally Abused: No    Physically Abused: No    Sexually Abused: No     PHYSICAL EXAM  GENERAL EXAM/CONSTITUTIONAL: Vitals:  Vitals:   06/15/22 1301  BP: 137/77  Pulse: 94  Weight: 204 lb (92.5 kg)  Height: $Remove'5\' 8"'[id number]$  (1.727 m)    Body mass index is 31.02 kg/m. Wt Readings from Last 3 Encounters:  06/15/22 204 lb (92.5 kg)  06/02/22 204 lb 8 oz (92.8 kg)  05/29/22 201 lb 4.5 oz (91.3 kg)   Patient is  in no distress; well developed, nourished and groomed; neck is supple  CARDIOVASCULAR: Examination of carotid arteries is normal; no carotid bruits Regular rate and rhythm, no murmurs Examination of peripheral vascular system by observation and palpation is normal  EYES: Ophthalmoscopic exam of optic discs and posterior segments is normal; no papilledema or hemorrhages No results found.  MUSCULOSKELETAL: Gait, strength, tone, movements noted in Neurologic exam below  NEUROLOGIC: MENTAL STATUS:      No data to display         awake, alert, oriented to person SLOW RESPONSES DECR attention and concentration DECR FLUENCY CALM, NO COMPLAINTS; SOME DENIAL OF DEFICITS  CRANIAL NERVE:  2nd - no papilledema on fundoscopic exam 2nd, 3rd, 4th, 6th - pupils equal and reactive to light, visual fields full to confrontation, extraocular muscles intact, no nystagmus 5th - facial sensation symmetric 7th - facial strength --> CANNOT RAISE EYEBROWS; DECR LOWER FACIAL STRENGTH; LEFT WORSE THAN RIGHT 8th - hearing intact 9th - palate elevates symmetrically, uvula midline 11th - shoulder shrug symmetric 12th - tongue protrusion --> DEVIATES TO LEFT MILD DYSARTHRIA  MOTOR:  INCREASED TONE IN LUE > RUE INCREASED TONE IN LLE > RLE LEFT ARM WEAKNESS (4 PROX, 3 DISTAL); FLEXED / CONTRACTED LLE 4 PROX, 3-4 DISTAL RUE, RLE 5 normal bulk and tone, full strength in the BUE, BLE  SENSORY:  normal and symmetric to light touch, temperature, vibration  COORDINATION:  finger-nose-finger, fine finger movements SLOW ON LEFT  REFLEXES:  deep tendon reflexes --> RUE 1, LEFT 2; KNEES 1; ANKLES ABSENT  GAIT/STATION:  narrow based gait; SHORT STEP, SPASTIC GAIT; LEFT HEMIPARETIC     DIAGNOSTIC DATA (LABS, IMAGING,  TESTING) - I reviewed patient records, labs, notes, testing and imaging myself where available.  Lab Results  Component Value Date   WBC 4.1 06/06/2022   HGB 13.4 06/06/2022   HCT 38.2 (L) 06/06/2022   MCV 85.8 06/06/2022   PLT 215 06/06/2022      Component Value Date/Time   NA 122 (L) 06/08/2022 1012   NA 118 (LL) 06/05/2022 1526   K 3.3 (L) 06/08/2022 1012   CL 86 (L) 06/08/2022 1012   CO2 25 06/08/2022 1012   GLUCOSE 109 (H) 06/08/2022 1012   BUN 9 06/08/2022 1012   BUN 9 06/05/2022 1526   CREATININE 0.79 06/08/2022 1012   CREATININE 0.86 02/09/2015 1032   CALCIUM 8.8 (L) 06/08/2022 1012   CALCIUM 9.2 03/27/2008 1350   PROT 7.2 06/06/2022 1208   PROT 7.4 02/06/2018 1558   ALBUMIN 4.3 06/06/2022 1208   ALBUMIN 4.4 02/06/2018 1558   AST 41 06/06/2022 1208   ALT 35 06/06/2022 1208   ALKPHOS 68 06/06/2022 1208   BILITOT 0.4 06/06/2022 1208   BILITOT <0.2 02/06/2018 1558   GFRNONAA >60 06/08/2022 1012   GFRNONAA >89 02/09/2015 1032   GFRAA 92 03/09/2020 0903   GFRAA >89 02/09/2015 1032   Lab Results  Component Value Date   CHOL 194 05/30/2022   HDL 57 05/30/2022   LDLCALC 121 (H) 05/30/2022   TRIG 81 05/30/2022   CHOLHDL 3.4 05/30/2022   Lab Results  Component Value Date   HGBA1C 5.8 (H) 05/29/2022   No results found for: "VITAMINB12" Lab Results  Component Value Date   TSH 0.138 (L) 05/29/2022    11/14/07 MRI brain (with and without) [I reviewed images myself and agree with interpretation. -VRP]  1. No acute intracranial abnormality.    2. Stable small resection cavity near  the anterior commissure on the left with no evidence of residual/recurrent disease.    3. Small calcification versus chronic microhemorrhage of the left cerebellum suspected.    08/30/21 MRI brain (with and without) [I reviewed images myself and agree with interpretation. -VRP]  1. Microangiopathic changes since 2008. 2. No recent insult or reversible finding  05/25/22 MRI brain [I  reviewed images myself and agree with interpretation. -VRP]  1. No evidence of acute intracranial abnormality. 2. Similar resection site near the anterior commissure without masslike enhancement in this region. 3. Progressive microangiopathic changes with evidence of prior hemorrhages and lacunar infarcts.    ASSESSMENT AND PLAN  42 y.o. year old male here with:   Dx:  1. Left-sided weakness      PLAN:  GAIT DIFFICULTY (ddx: chemotherapy neuropathy, metabolic neuropathy, CIDP; post radiation changes; microvascular ischemic changes) - post-chemo, post-radiation, post-biopsy (~7408) - worsening since June 2023 - some signs of facial diplegia and decreased reflexes in ankles (? CIDP; consider EMG/NCS evaluation; may be diff due to patient intolerance) - some increased tone in BUE, BLE; could be brain related; may consider MRI cervical spine (will need gen anesthesia) - continue PT (may need brace, cane, walker)  STROKE PREVENTION - continue aspirin 81, statin, BP control   Return for pending if symptoms worsen or fail to improve, return to PCP.  I spent 75 minutes of face-to-face and non-face-to-face time with patient.  This included previsit chart review, lab review, study review, order entry, electronic health record documentation, patient education.     Penni Bombard, MD 11/16/4816, 5:63 PM Certified in Neurology, Neurophysiology and Neuroimaging  [facility] [address] [place], [place] [address] [phone]

## 2022-06-15 NOTE — Therapy (Signed)
OUTPATIENT PHYSICAL THERAPY NEURO EVALUATION   Patient Name: Nicholas Holt MRN: [id number] DOB:1981/04/03, 41 y.o., male Today's Date: 06/15/2022   PCP: Aldine Contes, MD REFERRING PROVIDER: Axel Filler, MD   PT End of Session - 06/15/22 1148     Visit Number 4    Number of Visits 17    Date for PT Re-Evaluation 08/05/22    Authorization Type Medicare - Part A & B    PT Start Time 1145    PT Stop Time 1225    PT Time Calculation (min) 40 min    Equipment Utilized During Treatment Gait belt;Other (comment)   AFO   Activity Tolerance Patient tolerated treatment well    Behavior During Therapy [facility] for tasks assessed/performed;Impulsive              Past Medical History:  Diagnosis Date   Anxiety    Attention deficit disorder 08/08/2006   Bilateral leg cramps 11/25/2006   Blood transfusion without reported diagnosis 1997   Chemotherapy associated   Cough 12/08/2015   Elevated hemidiaphragm 07/09/2015   Left, presumably after portacath placement.    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  Complicated by left sided motor tic disorder and panhypopituitarism.    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  Includes central diabetes insipidus, secondary hypothyroidism, secondary adrenal insufficiency, and secondary hypogonadism.    Perennial allergic rhinitis 07/09/2015   Pre-diabetes    no meds   Seizure disorder ([place]) 08/08/2006   Predated brain tumor.  Generalized seizure ocurred after running out of lorazepam post-tumor.    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 Treated with alendronate from 2009-2016. DEXA (03/16/2008): L Spine T -2.0. L fem neck T -0.9, R fem neck T -1.9.  DEXA (10/15/2014): L Spine T -1.3, L fem neck T -0.6, R fem neck T -1.8.  Bisphosphonate holiday 06/2015.  Repeat DEXA scan in 10/2016 to reassess.    Past Surgical History:  Procedure Laterality Date   BRAIN BIOPSY  11/14/1995   CSF SHUNT  11/14/1995   PORTACATH PLACEMENT     portacath removal     RADIOLOGY WITH ANESTHESIA N/A 08/30/2021   Procedure: MRI WITH ANESTHESIA BRAIN WITH AND WITHOUT CONTRAST;  Surgeon: Radiologist, Medication, MD;  Location: [place];  Service: Radiology;  Laterality: N/A;   RADIOLOGY WITH ANESTHESIA N/A 05/25/2022   Procedure: MRI WITH ANESTHESIA OF BRAIN WITH AND WITHOUT CONTRAST;  Surgeon: Radiologist, Medication, MD;  Location: [place];  Service: Radiology;  Laterality: N/A;   Patient Active Problem List   Diagnosis Date Noted   Hyponatremia 05/30/2022   Multiple lacunar infarcts [facility]) 05/04/2022   Fall 04/13/2022   Healthcare maintenance 08/15/2021   AKI (acute kidney injury) ([place]) 01/11/2021   Hypertension 12/09/2019   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   Elevated hemidiaphragm 07/09/2015   Obesity (BMI 30.0-34.9) 07/09/2015   Perennial allergic rhinitis 07/09/2015   Preventative health care 07/09/2015   H/O malignant neoplasm of brain 09/22/2014   Steroid-induced osteoporosis 03/27/2008   Bilateral leg cramps 11/25/2006   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 male with history of suprasellar germinoma at age 6 with LUE/LLE hemiparesis (patient has been cared for by his mother since this time and has had persistent gait difficulties and limitation of left arm ROM), HTN, HLD, anxiety, ADD, chronic ataxia. Patient walks on his toes at baseline and has been more unsteady than usual over the past few weeks.  Pt was directly admitted  on 05/29/22 for work-up of acutely worsening balance and ambulation with recent MRI showeing evidence of prior hemorrhages and lacunar infarcts in bilateral basal ganglia and cerebellum   PRECAUTIONS: Fall   PATIENT GOALS: Be able to bowl again every Saturday morning    SUBJECTIVE: Pt reports no pain this date, no falls since last visit. Pt's mother present and is able to confirm that pt has been fit for custom AFO with [facility], has appointment on Aug 22 to receive brace and assess fit, trying to move up that appointment so pt will have brace sooner to use during PT sessions.                                                                                                                                                                                            Pt accompanied by: self and family member, mom GOES BY [name]  PAIN:  Are you having pain? No    TODAY'S TREATMENT:  GAIT: Gait pattern: step through pattern, decreased arm swing- Left, decreased step length- Left, decreased hip/knee flexion- Left, decreased ankle dorsiflexion- Left, and poor foot clearance- Left, to-walking on LLE Distance walked: 230 ft Assistive device utilized: None Level of assistance: SBA Comments: Gait around clinic with max cueing for heel strike with LLE to decrease toe-walking. Pt is able to maintain heel strike during gait with constant cueing but unable to maintain if distracted by environment, talking, or with turns.  Pt does exhibit some carryover is able to maintain heel strike during gait as he exits the clinic this date.   STAIRS:  Level of Assistance: CGA  Stair Negotiation Technique: Step to Pattern with Single Rail on Left  Number of Stairs: 12   Height of Stairs: 6  Comments: Focus on step-to gait pattern ascending with RLE, descending  with LLE, and just utilizing R handrail per home setup. Pt's mom reports pt does well on the stairs at home but needs some reinforcement of how to safely navigate them. Pt does appear apprehensive when descending stairs with just R handrail due to LUE weakness but declines to practice stairs laterally with BUE support.  THER EX: Initiated HEP, see below for gastroc and hamstring stretches  THER ACT: Initiated HEP, see below for cane step-overs and 6" step-taps   PATIENT EDUCATION: Education details: appropriate gait pattern, HEP Person educated: Patient and Parent Education method: Explanation, Demonstration, Tactile cues, Verbal cues, and Handouts Education comprehension: verbalized understanding   HOME EXERCISE PROGRAM: Access Code: [email or web address] URL: [email or web address] Date: 06/15/2022 Prepared by: Excell Seltzer  Exercises - Seated Hamstring Stretch with Strap  - 1 x daily - 7 x weekly - 2 sets - 5 reps - 30 hold - Gastroc Stretch on Wall  - 1 x daily - 7 x weekly - 2 sets - 5 reps - 30 hold - Standing Bilateral Gastroc Stretch with Step  - 1 x daily - 7 x weekly - 2 sets - 5 reps - 30 hold - Step-Over  - 1 x daily - 7 x weekly - 3 sets - 10 reps - Alternating Step Taps with Counter Support  - 1 x daily - 7 x weekly - 3 sets - 10 reps    GOALS: Goals reviewed with patient? Yes  SHORT TERM GOALS: Target date: 07/04/2022  Pt will be independent with initial HEP with family supervision in order to build upon functional gains made in therapy. Baseline: Goal status: INITIAL  2.  Pt will finish assessment of BERG with LTG  written. Baseline:  Goal status: INITIAL  3.  Pt will improve 5x sit<>stand to less than or equal to 19 sec with BUE support to demonstrate improved functional strength and transfer efficiency.  Baseline: 23.66 seconds with BUE support Goal status: INITIAL  4.  Pt will improve gait speed with no AD vs. LRAD to at least 2.1 ft/sec in order to demo improved community mobility.  Baseline: 1.88 ft/sec with no AD  Goal status: INITIAL  5.  Pt will improve TUG time to 22 seconds or less with no AD vs. LRAD in order to demo decrease fall risk. Baseline: 26.6 seconds no AD with min A Goal status: INITIAL   LONG TERM GOALS: Target date: 08/01/2022  Pt will be independent with final HEP with family supervision in order to build upon functional gains made in therapy. Baseline:  Goal status: INITIAL  2.  BERG goal to be written as appropriate to demo decr fall risk. Baseline: 06/08/22: 36/56 scored as baseline Goal status: INITIAL  3.  Pt will improve TUG time to 22 seconds or less with no AD vs. LRAD in order to demo decrease fall risk. Baseline: 26.6 seconds no AD with min A Goal status: INITIAL  4.  Pt will go up and down 4 steps with step to pattern and supervision with no handrail in order to safely enter/exit home. Baseline: Stairs not assessed - pt has 3 steps to get in and out without handrails Goal status: INITIAL  5.  Pt will improve gait speed with no AD vs. LRAD to at least 2.4 ft/sec in order to demo improved community mobility.  Baseline: 1.88 ft/sec with no AD  Goal status: INITIAL  ASSESSMENT:  CLINICAL IMPRESSION: Emphasis of skilled PT session on initiating HEP for patient including hamstring and gastroc  stretches and adding several balance activities to assist with improving gait mechanics with LLE. See HEP bolded exercises above. Also assessed gait in clinic with cues needed for pt to maintain L heel strike during gait, when distracted by environment or by talking he  reverts to toe-walking with LLE but able to correct again with cueing. Pt exhibits some carryover and exits clinic maintaining heel strike with LLE. Pt's mom also reports pt has been fit for his custom LLE AFO and has an appointment 8/22 to receive his brace, will try to move up appointment so that pt can use his AFO during PT session. Pt will continue to benefit from skilled PT services to address gait and balance impairments. Continue POC.   OBJECTIVE IMPAIRMENTS Abnormal gait, decreased activity tolerance, decreased balance, decreased coordination, decreased endurance, decreased knowledge of use of DME, decreased mobility, difficulty walking, decreased ROM, decreased strength, decreased safety awareness, impaired flexibility, impaired tone, impaired UE functional use, and postural dysfunction.   ACTIVITY LIMITATIONS carrying, standing, stairs, transfers, bathing, and locomotion level  PARTICIPATION LIMITATIONS: meal prep, cleaning, driving, community activity, and bowling  PERSONAL FACTORS Behavior pattern, Past/current experiences, Time since onset of injury/illness/exacerbation, and 3+ comorbidities: suprasellar germinoma status post chemo and radiation therapy at age 76 with LUE/LLE hemiparesis (patient has been cared for by his mother since this time and has had persistent gait difficulties and limitation of left arm ROM), HTN, HLD, anxiety, ADD, chronic ataxia.   are also affecting patient's functional outcome.   REHAB POTENTIAL: Fair due to chronicity of condition, pt has not received PT before.  CLINICAL DECISION MAKING: Evolving/moderate complexity  EVALUATION COMPLEXITY: Moderate  PLAN: PT FREQUENCY: 2x/week  PT DURATION: 8 weeks  PLANNED INTERVENTIONS: Therapeutic exercises, Therapeutic activity, Neuromuscular re-education, Balance training, Gait training, Patient/Family education, Self Care, Stair training, Vestibular training, Orthotic/Fit training, DME instructions, and Manual  therapy  PLAN FOR NEXT SESSION:  Assess stretching and balance HEP and add to as needed, complete BBS and write STG/LTG, strengthening, ROM esp L hamstring and ankle (gave stretches 8/3); did family move up AFO appt with [facility]?; gait mechanics- anything for carryover of longer and equal step length with L heel contact, step-ups/downs    Excell Seltzer, PT, DPT, CSRS 06/15/22, 12:49 PM

## 2022-06-16 DIAGNOSIS — E871 Hypo-osmolality and hyponatremia: Secondary | ICD-10-CM | POA: Diagnosis not present

## 2022-06-19 ENCOUNTER — Encounter: Payer: Self-pay | Admitting: Physical Therapy

## 2022-06-19 ENCOUNTER — Ambulatory Visit: Payer: Medicare Other | Admitting: Physical Therapy

## 2022-06-19 ENCOUNTER — Telehealth: Payer: Self-pay | Admitting: Student

## 2022-06-19 DIAGNOSIS — M6281 Muscle weakness (generalized): Secondary | ICD-10-CM

## 2022-06-19 DIAGNOSIS — R2689 Other abnormalities of gait and mobility: Secondary | ICD-10-CM

## 2022-06-19 DIAGNOSIS — R2681 Unsteadiness on feet: Secondary | ICD-10-CM | POA: Diagnosis not present

## 2022-06-19 DIAGNOSIS — Z9181 History of falling: Secondary | ICD-10-CM | POA: Diagnosis not present

## 2022-06-19 NOTE — Telephone Encounter (Signed)
Please call the patient's mother back regarding the patient sodium level and behavioral changes.  Pt mother is specifically asking for Dr. Coy Saunas only. Please call back.

## 2022-06-19 NOTE — Therapy (Signed)
OUTPATIENT PHYSICAL THERAPY NEURO TREATMENT   Patient Name: Nicholas Holt MRN: [id number] DOB:[birth year], 41 y.o., male Today's Date: 06/19/2022   PCP: Aldine Contes, MD REFERRING PROVIDER: Axel Filler, MD   PT End of Session - 06/19/22 1110     Visit Number 5    Number of Visits 17    Date for PT Re-Evaluation 08/05/22    Authorization Type Medicare - Part A & B    PT Start Time 1105    PT Stop Time 1145    PT Time Calculation (min) 40 min    Equipment Utilized During Treatment Gait belt;Other (comment)   L PLS AFO   Activity Tolerance Patient tolerated treatment well    Behavior During Therapy Nicholas Holt for tasks assessed/performed;Impulsive               Past Medical History:  Diagnosis Date   Anxiety    Attention deficit disorder 08/08/2006   Bilateral leg cramps 11/25/2006   Blood transfusion without reported diagnosis 1997   Chemotherapy associated   Cough 12/08/2015   Elevated hemidiaphragm 07/09/2015   Left, presumably after portacath placement.    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  Complicated by left sided motor tic disorder and panhypopituitarism.    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  Includes central diabetes insipidus, secondary hypothyroidism, secondary adrenal insufficiency, and secondary hypogonadism.    Perennial allergic rhinitis 07/09/2015   Pre-diabetes    no meds   Seizure disorder ([place]) 08/08/2006   Predated brain tumor.  Generalized seizure ocurred after running out of lorazepam post-tumor.    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 Treated with alendronate from 2009-2016. DEXA (03/16/2008): L Spine T -2.0. L fem neck T -0.9, R fem neck T -1.9.  DEXA (10/15/2014): L Spine T -1.3, L fem neck T -0.6, R fem neck T -1.8.  Bisphosphonate holiday 06/2015.  Repeat DEXA scan in 10/2016 to reassess.    Past Surgical History:  Procedure Laterality Date   BRAIN BIOPSY  11/14/1995   CSF SHUNT  11/14/1995   PORTACATH PLACEMENT     portacath removal     RADIOLOGY WITH ANESTHESIA N/A 08/30/2021   Procedure: MRI WITH ANESTHESIA BRAIN WITH AND WITHOUT CONTRAST;  Surgeon: Radiologist, Medication, MD;  Location: [place];  Service: Radiology;  Laterality: N/A;   RADIOLOGY WITH ANESTHESIA N/A 05/25/2022   Procedure: MRI WITH ANESTHESIA OF BRAIN WITH AND WITHOUT CONTRAST;  Surgeon: Radiologist, Medication, MD;  Location: [place];  Service: Radiology;  Laterality: N/A;   Patient Active Problem List   Diagnosis Date Noted   Hyponatremia 05/30/2022   Multiple lacunar infarcts [facility]) 05/04/2022   Fall 04/13/2022   Healthcare maintenance 08/15/2021   AKI (acute kidney injury) ([place]) 01/11/2021   Hypertension 12/09/2019   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   Elevated hemidiaphragm 07/09/2015   Obesity (BMI 30.0-34.9) 07/09/2015   Perennial allergic rhinitis 07/09/2015   Preventative health care 07/09/2015   H/O malignant neoplasm of brain 09/22/2014   Steroid-induced osteoporosis 03/27/2008   Bilateral leg cramps 11/25/2006   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 male with history of suprasellar germinoma at age 59 with LUE/LLE hemiparesis (patient has been cared for by his mother since this time and has had persistent gait difficulties and limitation of left arm ROM), HTN, HLD, anxiety, ADD, chronic ataxia. Patient walks on his toes at baseline and has been more unsteady than usual over the past few weeks.  Pt was directly admitted  on 05/29/22 for work-up of acutely worsening balance and ambulation with recent MRI showeing evidence of prior hemorrhages and lacunar infarcts in bilateral basal ganglia and cerebellum   PRECAUTIONS: Fall   PATIENT GOALS: Be able to bowl again every Saturday morning    SUBJECTIVE: Pt's mom reports that pt has not done as well over the past few days and she feels he has "regressed". Pt ambulates into the clinic this AM with ongoing L toe-walking and even with max cueing has difficulty maintaining heel/toe with gait. During last session pt was able to maintain heel/toe with cues but has increased difficulty this date. Pt's mom also reports he is not able to get into [facility] until 8/22 so had her reschedule some PT appointments until after he goes to [place] so that more visits can focus on gait training with brace he is actually going to be using at home.                                                                                                                                                                                            Pt accompanied by: self and family member, mom GOES BY Nicholas Holt  PAIN:  Are you having pain? No    TODAY'S TREATMENT:  GAIT: Gait pattern: step through pattern, decreased arm swing- Left, decreased step length- Left, decreased hip/knee flexion- Left, decreased ankle dorsiflexion- Left, and poor foot clearance- Left, toe-walking on LLE Distance walked: 230 ft Assistive device utilized: None Level of  assistance: SBA Comments: Gait around clinic with use of [place] PLS AFO with max cueing for heel strike with LLE to decrease toe-walking. Pt exhibits increased difficulty maintaining heel strike during gait this date, ongoing distraction by environment, talking, or with turns.   NMR: 6" step taps with progression to step-tap to 2nd  step (12") with R handrail with focus on L hip and knee flexion and control of limb, 2 x 10 reps to fatigue with target on step for more accurate foot placement  6" step-downs at bottom of stairs with B handrails with focus on eccentric LLE control, 2 x 10 reps  THER ACT:   [facility] PT Assessment - 06/19/22 1114       Standardized Balance Assessment   Standardized Balance Assessment Berg Balance Test      Berg Balance Test   Sit to Stand Able to stand  independently using hands    Standing Unsupported Able to stand safely 2 minutes    Sitting with Back Unsupported but Feet Supported on Floor or Stool Able to sit safely and securely 2 minutes    Stand to Sit Sits independently, has uncontrolled descent    Transfers Able to transfer safely, definite need of hands    Standing Unsupported with Eyes Closed Able to stand 10 seconds safely    Standing Unsupported with Feet Together Able to place feet together independently and stand for 1 minute with supervision    From Standing, Reach Forward with Outstretched Arm Loses balance while trying/requires external support    From Standing Position, Pick up Object from Floor Able to pick up shoe, needs supervision    From Standing Position, Turn to Look Behind Over each Shoulder Turn sideways only but maintains balance    Turn 360 Degrees Able to turn 360 degrees safely but slowly    Standing Unsupported, Alternately Place Feet on Step/Stool Needs assistance to keep from falling or unable to try    Standing Unsupported, One Foot in Front Needs help to step but can hold 15 seconds    Standing on One Leg Tries to lift  leg/unable to hold 3 seconds but remains standing independently    Total Score 31    Berg comment: 31/56, high fall risk               PATIENT EDUCATION: Education details: appropriate gait pattern, continue HEP, POC Person educated: Patient and Parent Education method: Explanation, Demonstration, Tactile cues, Verbal cues, and Handouts Education comprehension: verbalized understanding   HOME EXERCISE PROGRAM: Access Code: [facility] URL: [email or web address] Date: 06/15/2022 Prepared by: Excell Seltzer  Exercises - Seated Hamstring Stretch with Strap  - 1 x daily - 7 x weekly - 2 sets - 5 reps - 30 hold - Gastroc Stretch on Wall  - 1 x daily - 7 x weekly - 2 sets - 5 reps - 30 hold - Standing Bilateral Gastroc Stretch with Step  - 1 x daily - 7 x weekly - 2 sets - 5 reps - 30 hold - Step-Over  - 1 x daily - 7 x weekly - 3 sets - 10 reps - Alternating Step Taps with Counter Support  - 1 x daily - 7 x weekly - 3 sets - 10 reps    GOALS: Goals reviewed with patient? Yes  SHORT TERM GOALS: Target date: 07/04/2022  Pt will be independent with initial HEP with family supervision in order to build upon functional gains made in therapy. Baseline: Goal status: INITIAL  2.  Pt will finish assessment of BERG with LTG written. Baseline: 31/56 on 8/7 Goal status: MET  3.  Pt will improve 5x sit<>stand to less than or equal to 19 sec with BUE support to demonstrate improved functional strength and transfer efficiency.  Baseline: 23.66 seconds with BUE support Goal status: INITIAL  4.  Pt will improve gait speed with no AD vs. LRAD to at least 2.1 ft/sec in order to demo improved community mobility.  Baseline: 1.88 ft/sec with no AD  Goal status: INITIAL  5.  Pt will improve TUG time to 22 seconds or less with no AD vs. LRAD in order to demo decrease fall risk. Baseline: 26.6 seconds no AD with min A Goal status: INITIAL   LONG TERM GOALS: Target date:  08/01/2022  Pt will be independent with final HEP with family supervision in order to build upon functional gains made in therapy. Baseline:  Goal status: INITIAL  2.  Pt will improve Berg score to 40/56 for decreased fall risk Baseline: 31/56 on 8/7 Goal status: INITIAL  3.  Pt will improve TUG time to 22 seconds or less with no AD vs. LRAD in order to demo decrease fall risk. Baseline: 26.6 seconds no AD with min A Goal status: INITIAL  4.  Pt will go up and down 4 steps with step to pattern and supervision with no handrail in order to safely enter/exit home. Baseline: Stairs not assessed - pt has 3 steps to get in and out without handrails Goal status: INITIAL  5.  Pt will improve gait speed with no AD vs. LRAD to at least 2.4 ft/sec in order to demo improved community mobility.  Baseline: 1.88 ft/sec with no AD  Goal status: INITIAL  ASSESSMENT:  CLINICAL IMPRESSION: Emphasis of skilled PT session on assessing balance with BBS to that STG could be addressed and LTG created. Patient demonstrates increased fall risk as noted by score of 31/56 on Berg Balance Scale.  (<36= high risk for falls, close to 100%; 37-45 significant >80%; 46-51 moderate >50%; 52-55 lower >25%). Reviewed score and functional implications with patient. Also re-assessed gait with and without AFO this date. Pt continues to exhibit L toe-walking (increased without AFO) with decreased ability to follow verbal cues and adhere to heel/toe gait pattern as noted in previous session. Pt will receive his custom AFO from [facility] on 8/22, had pt's mom reschedule some PT appointments until after he receives his brace so he can benefit from continued gait training with the brace he will be using at home. Continue POC.    OBJECTIVE IMPAIRMENTS Abnormal gait, decreased activity tolerance, decreased balance, decreased coordination, decreased endurance, decreased knowledge of use of DME, decreased mobility, difficulty walking,  decreased ROM, decreased strength, decreased safety awareness, impaired flexibility, impaired tone, impaired UE functional use, and postural dysfunction.   ACTIVITY LIMITATIONS carrying, standing, stairs, transfers, bathing, and locomotion level  PARTICIPATION LIMITATIONS: meal prep, cleaning, driving, community activity, and bowling  PERSONAL FACTORS Behavior pattern, Past/current experiences, Time since onset of injury/illness/exacerbation, and 3+ comorbidities: suprasellar germinoma status post chemo and radiation therapy at age 39 with LUE/LLE hemiparesis (patient has been cared for by his mother since this time and has had persistent gait difficulties and limitation of left arm ROM), HTN, HLD, anxiety, ADD, chronic ataxia.   are also affecting patient's functional outcome.   REHAB POTENTIAL: Fair due to chronicity of condition, pt has not received PT before.  CLINICAL DECISION MAKING: Evolving/moderate complexity  EVALUATION COMPLEXITY: Moderate  PLAN: PT FREQUENCY: 2x/week  PT DURATION: 8 weeks  PLANNED INTERVENTIONS: Therapeutic exercises, Therapeutic activity, Neuromuscular re-education, Balance training, Gait training, Patient/Family education, Self Care, Stair training, Vestibular training, Orthotic/Fit training, DME instructions, and Manual therapy  PLAN FOR NEXT SESSION:  Assess stretching and balance HEP and add to as needed,  strengthening, ROM esp L hamstring and ankle (gave stretches 8/3); gait mechanics- anything for carryover of longer and equal step length with L heel contact, step-ups/downs (resisted?)     Excell Seltzer, PT, DPT, CSRS 06/19/22, 8:50 PM

## 2022-06-20 NOTE — Telephone Encounter (Signed)
I have spoken with the mom and addressed her concerns.

## 2022-06-21 ENCOUNTER — Ambulatory Visit: Payer: Medicare Other | Admitting: Physical Therapy

## 2022-06-26 ENCOUNTER — Ambulatory Visit: Payer: Medicare Other | Admitting: Physical Therapy

## 2022-06-26 DIAGNOSIS — R2689 Other abnormalities of gait and mobility: Secondary | ICD-10-CM

## 2022-06-26 DIAGNOSIS — R2681 Unsteadiness on feet: Secondary | ICD-10-CM

## 2022-06-26 DIAGNOSIS — M6281 Muscle weakness (generalized): Secondary | ICD-10-CM

## 2022-06-26 DIAGNOSIS — Z9181 History of falling: Secondary | ICD-10-CM | POA: Diagnosis not present

## 2022-06-26 NOTE — Therapy (Signed)
OUTPATIENT PHYSICAL THERAPY NEURO TREATMENT   Patient Name: Nicholas Holt MRN: [id number] DOB:Feb 05, 1981, 41 y.o., male Today's Date: 06/26/2022   PCP: Nicholas Contes, MD REFERRING PROVIDER: Axel Filler, MD   PT End of Session - 06/26/22 1022     Visit Number 6    Number of Visits 17    Date for PT Re-Evaluation 08/05/22    Authorization Type Medicare - Part A & B    PT Start Time 1020    PT Stop Time 1105    PT Time Calculation (min) 45 min    Equipment Utilized During Treatment Gait belt;Other (comment)   L PLS AFO   Activity Tolerance Patient tolerated treatment well    Behavior During Therapy [facility] for tasks assessed/performed;Impulsive               Past Medical History:  Diagnosis Date   Anxiety    Attention deficit disorder 08/08/2006   Bilateral leg cramps 11/25/2006   Blood transfusion without reported diagnosis 1997   Chemotherapy associated   Cough 12/08/2015   Elevated hemidiaphragm 07/09/2015   Left, presumably after portacath placement.    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  Complicated by left sided motor tic disorder and panhypopituitarism.    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  Includes central diabetes insipidus, secondary hypothyroidism, secondary adrenal insufficiency, and secondary hypogonadism.    Perennial allergic rhinitis 07/09/2015   Pre-diabetes    no meds   Seizure disorder ([place]) 08/08/2006   Predated brain tumor.  Generalized seizure ocurred after running out of lorazepam post-tumor.    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 Treated with alendronate from 2009-2016. DEXA (03/16/2008): L Spine T -2.0. L fem neck T -0.9, R fem neck T -1.9.  DEXA (10/15/2014): L Spine T -1.3, L fem neck T -0.6, R fem neck T -1.8.  Bisphosphonate holiday 06/2015.  Repeat DEXA scan in 10/2016 to reassess.    Past Surgical History:  Procedure Laterality Date   BRAIN BIOPSY  11/14/1995   CSF SHUNT  11/14/1995   PORTACATH PLACEMENT     portacath removal     RADIOLOGY WITH ANESTHESIA N/A 08/30/2021   Procedure: MRI WITH ANESTHESIA BRAIN WITH AND WITHOUT CONTRAST;  Surgeon: Radiologist, Medication, MD;  Location: [place];  Service: Radiology;  Laterality: N/A;   RADIOLOGY WITH ANESTHESIA N/A 05/25/2022   Procedure: MRI WITH ANESTHESIA OF BRAIN WITH AND WITHOUT CONTRAST;  Surgeon: Radiologist, Medication, MD;  Location: [place];  Service: Radiology;  Laterality: N/A;   Patient Active Problem List   Diagnosis Date Noted   Hyponatremia 05/30/2022   Multiple lacunar infarcts [facility]) 05/04/2022   Fall 04/13/2022   Healthcare maintenance 08/15/2021   AKI (acute kidney injury) ([place]) 01/11/2021   Hypertension 12/09/2019   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   Elevated hemidiaphragm 07/09/2015   Obesity (BMI 30.0-34.9) 07/09/2015   Perennial allergic rhinitis 07/09/2015   Preventative health care 07/09/2015   H/O malignant neoplasm of brain 09/22/2014   Steroid-induced osteoporosis 03/27/2008   Bilateral leg cramps 11/25/2006   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 male with history of suprasellar germinoma at age 28 with LUE/LLE hemiparesis (patient has been cared for by his mother since this time and has had persistent gait difficulties and limitation of left arm ROM), HTN, HLD, anxiety, ADD, chronic ataxia. Patient walks on his toes at baseline and has been more unsteady than usual over the past few weeks.  Pt was directly admitted  on 05/29/22 for work-up of acutely worsening balance and ambulation with recent MRI showeing evidence of prior hemorrhages and lacunar infarcts in bilateral basal ganglia and cerebellum   PRECAUTIONS: Fall   PATIENT GOALS: Be able to bowl again every Saturday morning    SUBJECTIVE: Pt's dad present so that he knows what exercises pt is supposed to do.                                                                                                                                                                                            Pt accompanied by: self and family member, Dad   PAIN:  Are you having pain? No    TODAY'S TREATMENT:  GAIT: Gait pattern: step through pattern, decreased arm swing- Left, decreased step length- Left, decreased hip/knee flexion- Left, decreased ankle dorsiflexion- Left, and poor foot clearance- Left, toe-walking on LLE Distance walked: 100' + short walks throughout session.  Pt responded to cues for stopping and restarting with left foot first. Assistive device utilized: None Level of assistance: SBA    Ther Ex:  Pt performed initial HEP with father present, giving guidance on how to perform at home.    Exercises - Seated Hamstring Stretch with Strap  - 1 x daily - 7 x weekly - 2 sets - 5 reps - 30 hold - Gastroc Stretch on Wall  - 1 x daily - 7 x weekly - 2 sets - 5 reps - 30 hold - Standing Bilateral Gastroc Stretch with Step  - 1 x daily - 7 x weekly - 2 sets - 5 reps - 30 hold -  Step-Over  - 1 x daily - 7 x weekly - 3 sets - 10 reps - Alternating Step Taps with Counter Support  - 1 x daily - 7 x weekly -  3 sets - 10 reps     PATIENT EDUCATION: Education details: Instructed father on how to give effective cues to pt during gait for safety and improved gait pattern. Person educated: Patient and Parent Education method: Explanation, Demonstration, Tactile cues, Verbal cues, and Handouts Education comprehension: verbalized understanding   HOME EXERCISE PROGRAM: Access Code: [email or web address] URL: [email or web address] Date: 06/15/2022 Prepared by: Excell Seltzer  Exercises - Seated Hamstring Stretch with Strap  - 1 x daily - 7 x weekly - 2 sets - 5 reps - 30 hold - Gastroc Stretch on Wall  - 1 x daily - 7 x weekly - 2 sets - 5 reps - 30 hold - Standing Bilateral Gastroc Stretch with Step  - 1 x daily - 7 x weekly - 2 sets - 5 reps - 30 hold - Step-Over  - 1 x daily - 7 x weekly - 3 sets - 10 reps - Alternating Step Taps with Counter Support  - 1 x daily - 7 x weekly - 3 sets - 10 reps    GOALS: Goals reviewed with patient? Yes  SHORT TERM GOALS: Target date: 07/04/2022  Pt will be independent with initial HEP with family supervision in order to build upon functional gains made in therapy. Baseline: Goal status: INITIAL  2.  Pt will finish assessment of BERG with LTG written. Baseline: 31/56 on 8/7 Goal status: MET  3.  Pt will improve 5x sit<>stand to less than or equal to 19 sec with BUE support to demonstrate improved functional strength and transfer efficiency.  Baseline: 23.66 seconds with BUE support Goal status: INITIAL  4.  Pt will improve gait speed with no AD vs. LRAD to at least 2.1 ft/sec in order to demo improved community mobility.  Baseline: 1.88 ft/sec with no AD  Goal status: INITIAL  5.  Pt will improve TUG time to 22 seconds or less with no AD vs. LRAD in order to demo decrease fall risk. Baseline: 26.6 seconds no AD with  min A Goal status: INITIAL   LONG TERM GOALS: Target date: 08/01/2022  Pt will be independent with final HEP with family supervision in order to build upon functional gains made in therapy. Baseline:  Goal status: INITIAL  2.  Pt will improve Berg score to 40/56 for decreased fall risk Baseline: 31/56 on 8/7 Goal status: INITIAL  3.  Pt will improve TUG time to 22 seconds or less with no AD vs. LRAD in order to demo decrease fall risk. Baseline: 26.6 seconds no AD with min A Goal status: INITIAL  4.  Pt will go up and down 4 steps with step to pattern and supervision with no handrail in order to safely enter/exit home. Baseline: Stairs not assessed - pt has 3 steps to get in and out without handrails Goal status: INITIAL  5.  Pt will improve gait speed with no AD vs. LRAD to at least 2.4 ft/sec in order to demo improved community mobility.  Baseline: 1.88 ft/sec with no AD  Goal status: INITIAL  ASSESSMENT:  CLINICAL IMPRESSION: Pt had notable improvement with Left heel strike during gait after performing HEP stretches. Pt continues to require cues but was more aware of what to aim for during ambulation.  Pt and father progressed with understanding of HEP in today's session.   OBJECTIVE IMPAIRMENTS Abnormal gait, decreased activity tolerance, decreased balance, decreased coordination, decreased endurance, decreased knowledge of use of DME, decreased mobility, difficulty walking, decreased ROM, decreased strength, decreased safety awareness,  impaired flexibility, impaired tone, impaired UE functional use, and postural dysfunction.   ACTIVITY LIMITATIONS carrying, standing, stairs, transfers, bathing, and locomotion level  PARTICIPATION LIMITATIONS: meal prep, cleaning, driving, community activity, and bowling  PERSONAL FACTORS Behavior pattern, Past/current experiences, Time since onset of injury/illness/exacerbation, and 3+ comorbidities: suprasellar germinoma status post chemo  and radiation therapy at age 1 with LUE/LLE hemiparesis (patient has been cared for by his mother since this time and has had persistent gait difficulties and limitation of left arm ROM), HTN, HLD, anxiety, ADD, chronic ataxia.   are also affecting patient's functional outcome.   REHAB POTENTIAL: Fair due to chronicity of condition, pt has not received PT before.  CLINICAL DECISION MAKING: Evolving/moderate complexity  EVALUATION COMPLEXITY: Moderate  PLAN: PT FREQUENCY: 2x/week  PT DURATION: 8 weeks  PLANNED INTERVENTIONS: Therapeutic exercises, Therapeutic activity, Neuromuscular re-education, Balance training, Gait training, Patient/Family education, Self Care, Stair training, Vestibular training, Orthotic/Fit training, DME instructions, and Manual therapy  PLAN FOR NEXT SESSION:  Assess stretching and balance HEP and add to as needed, strengthening, ROM esp L hamstring and ankle (gave stretches 8/3); gait mechanics- anything for carryover of longer and equal step length with L heel contact, step-ups/downs (resisted?)     Bjorn Loser, PTA  06/26/22, 12:09 PM

## 2022-06-28 ENCOUNTER — Ambulatory Visit: Payer: Medicare Other | Admitting: Physical Therapy

## 2022-06-30 DIAGNOSIS — E669 Obesity, unspecified: Secondary | ICD-10-CM | POA: Diagnosis not present

## 2022-06-30 DIAGNOSIS — R7309 Other abnormal glucose: Secondary | ICD-10-CM | POA: Diagnosis not present

## 2022-06-30 DIAGNOSIS — E230 Hypopituitarism: Secondary | ICD-10-CM | POA: Diagnosis not present

## 2022-06-30 DIAGNOSIS — R748 Abnormal levels of other serum enzymes: Secondary | ICD-10-CM | POA: Diagnosis not present

## 2022-06-30 DIAGNOSIS — E232 Diabetes insipidus: Secondary | ICD-10-CM | POA: Diagnosis not present

## 2022-07-03 ENCOUNTER — Telehealth: Payer: Self-pay | Admitting: Podiatry

## 2022-07-03 ENCOUNTER — Ambulatory Visit: Payer: Medicare Other | Admitting: Physical Therapy

## 2022-07-03 NOTE — Telephone Encounter (Signed)
Mother Stated that that [facility] never received the paper work for them to be seen tomorrow.  Mother is asking if you could please send the paperwork over to [facility] so they wont lose their appointment for tomorrow at 1pm .  Please advise

## 2022-07-05 ENCOUNTER — Ambulatory Visit: Payer: Medicare Other | Admitting: Physical Therapy

## 2022-07-05 DIAGNOSIS — Z9181 History of falling: Secondary | ICD-10-CM | POA: Diagnosis not present

## 2022-07-05 DIAGNOSIS — R2681 Unsteadiness on feet: Secondary | ICD-10-CM | POA: Diagnosis not present

## 2022-07-05 DIAGNOSIS — M6281 Muscle weakness (generalized): Secondary | ICD-10-CM

## 2022-07-05 DIAGNOSIS — R2689 Other abnormalities of gait and mobility: Secondary | ICD-10-CM

## 2022-07-05 NOTE — Therapy (Signed)
OUTPATIENT PHYSICAL THERAPY NEURO TREATMENT   Patient Name: Nicholas Holt MRN: [id number] DOB:Nov 06, 1981, 41 y.o., male Today's Date: 07/05/2022   PCP: Aldine Contes, MD REFERRING PROVIDER: Axel Filler, MD   PT End of Session - 07/05/22 1056     Visit Number 7    Number of Visits 17    Date for PT Re-Evaluation 08/05/22    Authorization Type Medicare - Part A & B    Progress Note Due on Visit 10    PT Start Time 1056    PT Stop Time 1140    PT Time Calculation (min) 44 min    Equipment Utilized During Treatment Gait belt;Other (comment)   L PLS AFO   Activity Tolerance Patient tolerated treatment well    Behavior During Therapy [facility] for tasks assessed/performed;Impulsive                Past Medical History:  Diagnosis Date   Anxiety    Attention deficit disorder 08/08/2006   Bilateral leg cramps 11/25/2006   Blood transfusion without reported diagnosis 1997   Chemotherapy associated   Cough 12/08/2015   Elevated hemidiaphragm 07/09/2015   Left, presumably after portacath placement.    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  Complicated by left sided motor tic disorder and panhypopituitarism.    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  Includes central diabetes insipidus, secondary hypothyroidism, secondary adrenal insufficiency, and secondary hypogonadism.    Perennial allergic rhinitis 07/09/2015   Pre-diabetes    no meds   Seizure disorder ([place]) 08/08/2006   Predated brain tumor.  Generalized seizure ocurred after running out of  lorazepam post-tumor.    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 Treated with alendronate from 2009-2016. DEXA (03/16/2008): L Spine T -2.0. L fem neck T -0.9, R fem neck T -1.9.  DEXA (10/15/2014): L Spine T -1.3, L fem neck T -0.6, R fem neck T -1.8.  Bisphosphonate holiday 06/2015.  Repeat DEXA scan in 10/2016 to reassess.    Past Surgical History:  Procedure Laterality Date   BRAIN BIOPSY  11/14/1995   CSF SHUNT  11/14/1995   PORTACATH PLACEMENT     portacath removal     RADIOLOGY WITH ANESTHESIA N/A 08/30/2021   Procedure: MRI WITH ANESTHESIA BRAIN WITH AND WITHOUT CONTRAST;  Surgeon: Radiologist, Medication, MD;  Location: [place];  Service: Radiology;  Laterality: N/A;   RADIOLOGY WITH ANESTHESIA N/A 05/25/2022   Procedure: MRI WITH ANESTHESIA OF BRAIN WITH AND WITHOUT CONTRAST;  Surgeon: Radiologist, Medication, MD;  Location: [place];  Service: Radiology;  Laterality: N/A;   Patient Active Problem List   Diagnosis Date Noted   Hyponatremia 05/30/2022   Multiple lacunar infarcts [facility]) 05/04/2022   Fall 04/13/2022   Healthcare maintenance 08/15/2021   AKI (acute kidney injury) ([place]) 01/11/2021   Hypertension 12/09/2019   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   Elevated hemidiaphragm 07/09/2015   Obesity (BMI 30.0-34.9) 07/09/2015   Perennial allergic rhinitis 07/09/2015   Preventative health care 07/09/2015   H/O malignant neoplasm of brain 09/22/2014   Steroid-induced osteoporosis 03/27/2008   Bilateral leg  cramps 11/25/2006   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 male with history of suprasellar germinoma at age 64 with LUE/LLE hemiparesis (patient has been cared for by his mother since this time and has had persistent gait difficulties and limitation of left arm ROM), HTN, HLD, anxiety, ADD, chronic ataxia. Patient walks on his toes at baseline and has been more unsteady than usual over the past few weeks.  Pt was directly admitted  on 05/29/22 for work-up of acutely worsening balance and ambulation with recent MRI showeing evidence of prior hemorrhages and lacunar infarcts in bilateral basal ganglia and cerebellum   PRECAUTIONS: Fall   PATIENT GOALS: Be able to bowl again every Saturday morning    SUBJECTIVE: Pt reports he got his brace from [place] yesterday, "I love it". No pain, no falls. HEP is going well, no questions.                                                                                                                                                                                            Pt accompanied by: self   PAIN:  Are you having pain? No    TODAY'S TREATMENT:  GAIT:  Gait pattern: step through pattern, decreased arm swing- Left, decreased step length- Left, decreased hip/knee flexion- Left, decreased ankle dorsiflexion- Left, and poor foot clearance- Left, toe-walking on LLE Distance walked: 230 ft Assistive device utilized: None Level of assistance: SBA Comments: gait with pt's new L AFO, pt continues to require cues for L heel strike, stop and restart leading with LLE, ongoing L toe-walking but improved with AFO   THER ACT:   [facility] PT Assessment - 07/05/22 1101       Ambulation/Gait   Gait velocity 32.8' over 13.53 sec = 2.42 ft/sec      Standardized Balance Assessment   Standardized Balance Assessment Timed Up and Go Test;Five Times Sit to Stand    Five times sit to stand comments   20.28 sec   use of BUE on arms of chair     Timed Up and Go Test   TUG Normal TUG    Normal TUG (seconds) 16.97  Forward/back stepping with LLE over beam with min A for balance, needs chair for RUE support to prevent excessive R lateral flexion with focus on increasing LLE step length, step height, and heel strike with LLE. (2 x 10 reps)  Resisted sit to stand with red theraband x 10 reps with UE support on mat table Sit to stand x 10 reps with arms crossed (no UE support), relies on LE bracing against mat table with CGA  THER EX: LE stretching prior to gait training: -seated HS stretch 3 x 60 sec -gastroc stretch 3 x 60 sec at stairs   PATIENT EDUCATION: Education details: continue HEP Person educated: Patient and Parent (parents in lobby) Education method: Explanation, Media planner, Corporate treasurer cues, Verbal cues, and Handouts Education comprehension: verbalized understanding   HOME EXERCISE PROGRAM: Access Code: [facility] URL: [email or web address] Date: 06/15/2022 Prepared by: Excell Seltzer  Exercises - Seated Hamstring Stretch with Strap  - 1 x daily - 7 x weekly - 2 sets - 5 reps - 30 hold - Gastroc Stretch on Wall  - 1 x daily - 7 x weekly - 2 sets - 5 reps - 30 hold - Standing Bilateral Gastroc Stretch with Step  - 1 x daily - 7 x weekly - 2 sets - 5 reps - 30 hold - Step-Over  - 1 x daily - 7 x weekly - 3 sets - 10 reps - Alternating Step Taps with Counter Support  - 1 x daily - 7 x weekly - 3 sets - 10 reps    GOALS: Goals reviewed with patient? Yes  SHORT TERM GOALS: Target date: 07/04/2022  Pt will be independent with initial HEP with family supervision in order to build upon functional gains made in therapy. Baseline: Goal status: MET  2.  Pt will finish assessment of BERG with LTG written. Baseline: 31/56 on 8/7 Goal status: MET  3.  Pt will improve 5x sit<>stand to less than or equal to 19 sec with BUE support to  demonstrate improved functional strength and transfer efficiency.  Baseline: 23.66 seconds with BUE support, 20.28 sec (8/23) Goal status: IN PROGRESS  4.  Pt will improve gait speed with no AD vs. LRAD to at least 2.1 ft/sec in order to demo improved community mobility.  Baseline: 1.88 ft/sec with no AD, 2.42 ft/sec (8/23)  Goal status: MET  5.  Pt will improve TUG time to 22 seconds or less with no AD vs. LRAD in order to demo decrease fall risk. Baseline: 26.6 seconds no AD with min A, 16.97 sec no AD Supervision (8/23) Goal status: MET   LONG TERM GOALS: Target date: 08/01/2022  Pt will be independent with final HEP with family supervision in order to build upon functional gains made in therapy. Baseline:  Goal status: INITIAL  2.  Pt will improve Berg score to 40/56 for decreased fall risk Baseline: 31/56 on 8/7 Goal status: INITIAL  3.  Pt will improve TUG time to 15 seconds or less with no AD vs. LRAD in order to demo decrease fall risk. Baseline: 26.6 seconds no AD with min A, 16.97 sec no AD S* (8/23) Goal status: REVISED  4.  Pt will go up and down 4 steps with step to pattern and supervision with no handrail in order to safely enter/exit home. Baseline: Stairs not assessed - pt has 3 steps to get in and out without handrails Goal status: INITIAL  5.  Pt will improve gait speed with no AD vs. LRAD to  at least 2.8 ft/sec in order to demo improved community mobility.  Baseline: 1.88 ft/sec with no AD, 2.42 ft/sec with no AD (8/23)  Goal status: REVISED  ASSESSMENT:  CLINICAL IMPRESSION: Emphasis of skilled PT session on performing STG assessment and working in improving gait mechanics now that pt has his new AFO. Pt has met 4/5 STG due to improved overall gait speed, improved balance, and ability to perform HEP with Supervision from his family. Upgraded some of pt's LTG due to great progress. Pt has improved his gait speed from 1.88 ft/sec on initial eval to 2.42 ft/sec  this date, improved his score on the TUG from 26.6 sec to 16.97 sec, and improved his 5xSTS time from 23.66 sec to 20.28 sec. Pt continues to exhibit decreased sustained attention to his LLE resulting in ongoing toe-walking when he does not attend to limb. Pt does exhibit improved overall gait mechanics with use of his AFO vs gait with no AFO but does require ongoing cueing and skilled therapy to address gait and balance deficits. Continue POC.   OBJECTIVE IMPAIRMENTS Abnormal gait, decreased activity tolerance, decreased balance, decreased coordination, decreased endurance, decreased knowledge of use of DME, decreased mobility, difficulty walking, decreased ROM, decreased strength, decreased safety awareness, impaired flexibility, impaired tone, impaired UE functional use, and postural dysfunction.   ACTIVITY LIMITATIONS carrying, standing, stairs, transfers, bathing, and locomotion level  PARTICIPATION LIMITATIONS: meal prep, cleaning, driving, community activity, and bowling  PERSONAL FACTORS Behavior pattern, Past/current experiences, Time since onset of injury/illness/exacerbation, and 3+ comorbidities: suprasellar germinoma status post chemo and radiation therapy at age 38 with LUE/LLE hemiparesis (patient has been cared for by his mother since this time and has had persistent gait difficulties and limitation of left arm ROM), HTN, HLD, anxiety, ADD, chronic ataxia.   are also affecting patient's functional outcome.   REHAB POTENTIAL: Fair due to chronicity of condition, pt has not received PT before.  CLINICAL DECISION MAKING: Evolving/moderate complexity  EVALUATION COMPLEXITY: Moderate  PLAN: PT FREQUENCY: 2x/week  PT DURATION: 8 weeks  PLANNED INTERVENTIONS: Therapeutic exercises, Therapeutic activity, Neuromuscular re-education, Balance training, Gait training, Patient/Family education, Self Care, Stair training, Vestibular training, Orthotic/Fit training, DME instructions, and  Manual therapy  PLAN FOR NEXT SESSION:  Add to HEP as needed, strengthening, ROM esp L hamstring and ankle (gave stretches 8/3); gait mechanics- anything for carryover of longer and equal step length with L heel contact, step-ups/downs (resisted?), backwards walking   Excell Seltzer, PT, DPT, CSRS 07/05/22, 11:45 AM

## 2022-07-06 ENCOUNTER — Ambulatory Visit (INDEPENDENT_AMBULATORY_CARE_PROVIDER_SITE_OTHER): Payer: Medicare Other | Admitting: Internal Medicine

## 2022-07-06 ENCOUNTER — Encounter: Payer: Self-pay | Admitting: Internal Medicine

## 2022-07-06 VITALS — BP 120/73 | HR 75 | Temp 97.7°F | Ht 68.0 in | Wt 197.0 lb

## 2022-07-06 DIAGNOSIS — F988 Other specified behavioral and emotional disorders with onset usually occurring in childhood and adolescence: Secondary | ICD-10-CM

## 2022-07-06 DIAGNOSIS — G40909 Epilepsy, unspecified, not intractable, without status epilepticus: Secondary | ICD-10-CM | POA: Diagnosis not present

## 2022-07-06 DIAGNOSIS — I10 Essential (primary) hypertension: Secondary | ICD-10-CM | POA: Diagnosis not present

## 2022-07-06 DIAGNOSIS — E230 Hypopituitarism: Secondary | ICD-10-CM

## 2022-07-06 DIAGNOSIS — G4733 Obstructive sleep apnea (adult) (pediatric): Secondary | ICD-10-CM | POA: Diagnosis not present

## 2022-07-06 DIAGNOSIS — N179 Acute kidney failure, unspecified: Secondary | ICD-10-CM

## 2022-07-06 DIAGNOSIS — E785 Hyperlipidemia, unspecified: Secondary | ICD-10-CM | POA: Diagnosis not present

## 2022-07-06 DIAGNOSIS — E871 Hypo-osmolality and hyponatremia: Secondary | ICD-10-CM

## 2022-07-06 DIAGNOSIS — E663 Overweight: Secondary | ICD-10-CM | POA: Diagnosis not present

## 2022-07-06 DIAGNOSIS — Z0000 Encounter for general adult medical examination without abnormal findings: Secondary | ICD-10-CM

## 2022-07-06 DIAGNOSIS — I6381 Other cerebral infarction due to occlusion or stenosis of small artery: Secondary | ICD-10-CM

## 2022-07-06 HISTORY — DX: Obstructive sleep apnea (adult) (pediatric): G47.33

## 2022-07-06 NOTE — Assessment & Plan Note (Signed)
-   Patient's mother stated that patient was diagnosed with OSA in the past -She states that she saw that there were new treatments for OSA as he could not tolerate the facemask as it made him claustrophobic -Patient does snore at night and does have some daytime somnolence and fatigue concerning for possible OSA -We will refer patient for sleep study for further evaluation -If patient does have OSA he may benefit from nasal pillows instead of a full facemask -No further work-up at this time

## 2022-07-06 NOTE — Assessment & Plan Note (Signed)
-   This problem has improved -Patient did have a recent episode of hyponatremia with sodium getting as low as 118.  This is likely secondary to increase fluid intake, lower dose of his hydrocortisone as well as being on DDAVP -Patient is now on a higher dose of hydrocortisone (10 mg daily) and was instructed to drink only when thirsty and avoid excessive water intake secondary to being on DDAVP -His sodium level at Dr. Cindra Eves office was 141 -No further work-up at this time

## 2022-07-06 NOTE — Assessment & Plan Note (Signed)
-   Patient was noted to have an elevated creatinine last year on routine blood work but this is now normalized -Patient with no urinary complaints -No further work-up required at this time

## 2022-07-06 NOTE — Assessment & Plan Note (Signed)
-   Patient mother was interested in having the flu shot as well as a COVID booster -She will call the clinic after Labor Day to see if we have our injections and then bring him in for a flu shot -She will follow-up with patient's pharmacy when the [place] booster is released to obtain his COVID booster

## 2022-07-06 NOTE — Assessment & Plan Note (Signed)
-   Patient continues to improve and is down to 197 pounds today -His BMI is now less than 30 and he is in the overweight category rather than obesity -We will continue to monitor closely

## 2022-07-06 NOTE — Progress Notes (Signed)
   Subjective:    Patient ID: Nicholas Holt, male    DOB: May 06, 1981, 41 y.o.   MRN: [id number]  HPI  I have seen and examined this patient.  Patient is here for routine follow-up of his hypertension and CVA.  Patient's weakness is continued to improve.  He did have a fall at home but this was when he was excited and trying to walk quickly without his brace.  Patient's mother was concerned about his focus and states that he was diagnosed with ADHD in the past and was wondering if he needed medication to be restarted for this.  Patient's mother also stated that he was diagnosed with sleep apnea in the past and would like him to be worked up for this.  He denies any other complaints and states that he feels better today.  He is compliant with all his medications.  He is excited that he is getting better and wants to get back to bowling again.   Review of Systems  Constitutional: Negative.   HENT: Negative.    Respiratory: Negative.    Cardiovascular: Negative.   Gastrointestinal: Negative.   Musculoskeletal: Negative.   Neurological:  Positive for weakness.       Patient with left-sided weakness and recent falls for which he has had extensive work-up and this has been improving  Psychiatric/Behavioral: Negative.         Objective:   Physical Exam Constitutional:      Appearance: Normal appearance.  HENT:     Head: Normocephalic and atraumatic.  Cardiovascular:     Rate and Rhythm: Normal rate and regular rhythm.     Heart sounds: Normal heart sounds.  Pulmonary:     Effort: No respiratory distress.     Breath sounds: Normal breath sounds. No wheezing.  Abdominal:     General: Bowel sounds are normal. There is no distension.     Palpations: Abdomen is soft.     Tenderness: There is no abdominal tenderness.  Musculoskeletal:        General: No swelling or tenderness.  Neurological:     Mental Status: He is alert and oriented to person, place, and time.     Comments: Chronic  left-sided weakness.  Left foot in brace.  Psychiatric:        Mood and Affect: Mood normal.        Behavior: Behavior normal.           Assessment & Plan:   Please see problem based charting for assessment and plan:

## 2022-07-06 NOTE — Assessment & Plan Note (Signed)
-   This problem is chronic and stable -Patient follows up with Dr. Buddy Duty from endocrinology for this -Patient is compliant with desmopressin 10 mcg 4 times a day, testosterone gel daily as well as hydrocortisone 10 mg every morning -Patient has had episodes of hyponatremia recently likely in the setting of increased water intake and use of DDAVP.  Patient has been follow-up with Dr. Buddy Duty and his last sodium was 141 on August 18th. -No further work-up at this time -We will continue to monitor closely

## 2022-07-06 NOTE — Assessment & Plan Note (Signed)
-   Patient has a history of attention deficit disorder per mother and was on Ritalin as a child -Patient states that he does have difficulty focusing and thinks that he may benefit from reinitiation of his medication -I offered to refer the patient to psychiatry for assessment for ADHD and possible initiation of medication -Patient's mother declined at this time but will follow-up with me if this gets worse -No further work-up at this time

## 2022-07-06 NOTE — Assessment & Plan Note (Signed)
-   This problem is chronic and stable -Patient has not had any further seizure episodes and is doing well on his current regimen -Continue lorazepam 6 mg at night -Further work-up at this time

## 2022-07-06 NOTE — Assessment & Plan Note (Signed)
BP Readings from Last 3 Encounters:  07/06/22 120/73  06/15/22 137/77  06/06/22 123/85    Lab Results  Component Value Date   NA 122 (L) 06/08/2022   K 3.3 (L) 06/08/2022   CREATININE 0.79 06/08/2022    Assessment: Blood pressure control:  Well-controlled Progress toward BP goal:   At goal Comments: Patient is compliant with amlodipine 5 mg daily as well as losartan 25 mg daily  Plan: Medications:  continue current medications Educational resources provided:   Self management tools provided:   Other plans: We will check BMP at next visit

## 2022-07-06 NOTE — Assessment & Plan Note (Signed)
-   This problem is chronic and stable -Patient's last LDL was 121 in July and his HDL was 81. -He was started on high intensity statin (atorvastatin 40 mg daily) given his history of lacunar infarcts -We will recheck his lipid panel at his next visit -Given the patient does have elevated CK levels at baseline we will also recheck his CK levels at his next visit to ensure no worsening on statin -No further work-up at this time

## 2022-07-06 NOTE — Assessment & Plan Note (Signed)
-   Patient was noted to have worsening weakness earlier this year as well as gait ataxia and had an MRI done which showed multiple lacunar infarcts which were chronic in nature -Work-up for her stroke was unremarkable -We will continue with aspirin and statin for now -Patient did follow-up with neurology (Dr. Leta Baptist) for his weakness as well as CVAs -His follow-up recommendations were appreciated.  Per mom, patient was very ticklish and would not have tolerated a nerve conduction study.  Given his worsening weakness he may benefit from an MRI of his cervical spine per neurology.  He will follow-up with them to see if this is warranted or not -Continue with PT -No further work-up at this time

## 2022-07-06 NOTE — Patient Instructions (Signed)
-  It was a pleasure seeing you today -I am glad you are feeling better. -Your sodium levels were normal in Dr. Cindra Eves office.  Continue to take your medications as prescribed by him -We will check blood work at your next visit including your cholesterol level, muscle enzyme and kidney function and sodium levels -Continue to work with physical therapy and follow-up with neurology for your weakness. -We will schedule you for a sleep study to assess for sleep apnea -Please call us on the first of the month to schedule your flu shot. -Please follow-up with me in 3 months

## 2022-07-10 ENCOUNTER — Ambulatory Visit: Payer: Medicare Other | Admitting: Physical Therapy

## 2022-07-10 DIAGNOSIS — R2681 Unsteadiness on feet: Secondary | ICD-10-CM

## 2022-07-10 DIAGNOSIS — R2689 Other abnormalities of gait and mobility: Secondary | ICD-10-CM | POA: Diagnosis not present

## 2022-07-10 DIAGNOSIS — M6281 Muscle weakness (generalized): Secondary | ICD-10-CM | POA: Diagnosis not present

## 2022-07-10 DIAGNOSIS — Z9181 History of falling: Secondary | ICD-10-CM | POA: Diagnosis not present

## 2022-07-10 NOTE — Therapy (Signed)
OUTPATIENT PHYSICAL THERAPY NEURO TREATMENT   Patient Name: Nicholas Holt MRN: [id number] DOB:28-Aug-1981, 41 y.o., male Today's Date: 07/10/2022   PCP: Aldine Contes, MD REFERRING PROVIDER: Axel Filler, MD   PT End of Session - 07/10/22 1018     Visit Number 8    Number of Visits 17    Date for PT Re-Evaluation 08/05/22    Authorization Type Medicare - Part A & B    Progress Note Due on Visit 10    PT Start Time 1016    PT Stop Time 1100    PT Time Calculation (min) 44 min    Equipment Utilized During Treatment Gait belt;Other (comment)   L custom AFO   Activity Tolerance Patient tolerated treatment well    Behavior During Therapy [facility] for tasks assessed/performed;Impulsive                 Past Medical History:  Diagnosis Date   AKI (acute kidney injury) ([place])    AKI (acute kidney injury) ([place]) 01/11/2021   Anxiety    Attention deficit disorder 08/08/2006   Bilateral leg cramps 11/25/2006   Blood transfusion without reported diagnosis 1997   Chemotherapy associated   Cough 12/08/2015   Elevated hemidiaphragm 07/09/2015   Left, presumably after portacath placement.    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  Complicated by left sided motor tic disorder and panhypopituitarism.    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  Includes central diabetes insipidus, secondary hypothyroidism, secondary adrenal insufficiency, and secondary hypogonadism.    Perennial allergic rhinitis 07/09/2015   Pre-diabetes     no meds   RENAL INSUFFICIENCY    Seizure disorder ([place]) 08/08/2006   Predated brain tumor.  Generalized seizure ocurred after running out of lorazepam post-tumor.    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 Treated with alendronate from 2009-2016. DEXA (03/16/2008): L Spine T -2.0. L fem neck T -0.9, R fem neck T -1.9.  DEXA (10/15/2014): L Spine T -1.3, L fem neck T -0.6, R fem neck T -1.8.  Bisphosphonate holiday 06/2015.  Repeat DEXA scan in 10/2016 to reassess.    Past Surgical History:  Procedure Laterality Date   BRAIN BIOPSY  11/14/1995   CSF SHUNT  11/14/1995   PORTACATH PLACEMENT     portacath removal     RADIOLOGY WITH ANESTHESIA N/A 08/30/2021   Procedure: MRI WITH ANESTHESIA BRAIN WITH AND WITHOUT CONTRAST;  Surgeon: Radiologist, Medication, MD;  Location: [place];  Service: Radiology;  Laterality: N/A;   RADIOLOGY WITH ANESTHESIA N/A 05/25/2022   Procedure: MRI WITH ANESTHESIA OF BRAIN WITH AND WITHOUT CONTRAST;  Surgeon: Radiologist, Medication, MD;  Location: [place];  Service: Radiology;  Laterality: N/A;   Patient Active Problem List   Diagnosis Date Noted   OSA (obstructive sleep apnea) 07/06/2022   Hyponatremia 05/30/2022   Multiple lacunar infarcts ([place]) 05/04/2022   Fall 04/13/2022   Hypertension 12/09/2019   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   Elevated hemidiaphragm 07/09/2015   Overweight 07/09/2015   Perennial allergic rhinitis 07/09/2015  Preventative health care 07/09/2015   H/O malignant neoplasm of brain 09/22/2014   Steroid-induced osteoporosis 03/27/2008   Bilateral leg cramps 11/25/2006   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 male with history of suprasellar germinoma at age 25 with LUE/LLE hemiparesis (patient has been cared for by his mother since this time and has had persistent gait difficulties and limitation of left arm ROM), HTN, HLD, anxiety, ADD, chronic ataxia. Patient walks on his toes at baseline and has been more unsteady than usual over the past few weeks.  Pt was directly admitted  on 05/29/22 for work-up of acutely worsening balance and ambulation with recent MRI showeing evidence of prior hemorrhages and lacunar infarcts in bilateral basal ganglia and cerebellum   PRECAUTIONS: Fall   PATIENT GOALS: Be able to bowl again every Saturday morning    SUBJECTIVE: Pt received seated in lobby with parents present. Pt's mom reports pt's L AFO has been a "booger" and that they are having difficulty getting the brace in/out of pt's shoes and that at one point pt was walking up the stairs and his shoe came off his foot entirely. Pt's mom also asking about shoes to better accomodate the AFO and wanting to know if she needs specialty shoes from [employer].                                                                                                                                                                                            Pt accompanied by: self, parents (in the lobby)   PAIN:  Are you having pain? No    TODAY'S TREATMENT:  GAIT:  Gait pattern: step through pattern, decreased step length- Left, decreased stance time- Left, decreased stride length, decreased hip/knee flexion- Left, decreased ankle dorsiflexion- Left, and poor foot clearance- Left, toe-walking on LLE Distance walked: 230 ft, x 2 Assistive device utilized: None Level of assistance: SBA Comments: gait  prior to addition of 3# ankle weight and following removal of 3# ankle weight; prior to use of weight pt exhibits frequent toe-walking with LLE, needs cues to stop and reset gait leading with LLE and max verbal cueing for heel strike; following use of 3# ankle weight pt exhibits  improved control of LLE during gait with improved heel strike with decreased cues needed; wearing L custom AFO  Gait pattern: step through pattern, decreased step length- Left, decreased stance time- Left, decreased stride length, decreased hip/knee flexion- Left, decreased ankle dorsiflexion- Left, and poor foot clearance- Left Distance walked: 230 ft Assistive device utilized: None Level of assistance: SBA Comments: use of 3# ankle weight on LLE with gait; pt exhibits ongoing frequent LLE toe-walking and needs cues to stop and reset leading with LLE heel strike; wearing custom L AFO   STAIRS:  Level of Assistance: SBA  Stair Negotiation Technique: Step to Pattern with Bilateral Rails  Number of Stairs: 12   Height of Stairs: 6   Comments: Pt initially performs stairs as he does them at home, ascends with LLE and descends with RLE. Cued pt to ascend with RLE and descend with LLE. When ascending stairs in this way pt needs cues to bring LLE fully onto the step before moving on to the next step.  Returned to stairs at end of session to assess recall and carryover of education. Pt exhibits poor carryover and attempts to ascend stairs with RLE first, needs cues to recall sequencing reviewed earlier in session.  NMR: Added to HEP for stairs and for LLE step-ups  Standing LLE 6" step-ups with RUE support, x 15 reps with focus on hip and knee flexion  Assessed donning/doffing of L custom AFO. Pt able to don brace with greater ease if he leaves AFO inside the shoe and just slides his foot in/out of brace and shoe simultaneously. Demonstrated to pt's mom how to don/doff brace and shoe with greater ease. Also demonstrated how  to tie laces securely to prevent foot from sliding in/out of shoe. Pt's current shoes appear to be adequate for use of his custom AFO, not recommending pt's family purchase other shoes at this time.   PATIENT EDUCATION: Education details: continue HEP, added to HEP, how to don/doff brace Person educated: Patient and Parent (parents in lobby) Education method: Explanation, Demonstration, Corporate treasurer cues, Verbal cues, and Handouts Education comprehension: verbalized understanding   HOME EXERCISE PROGRAM: Access Code: [facility] URL: [email or web address] Date: 06/15/2022 Prepared by: Excell Seltzer  Exercises - Seated Hamstring Stretch with Strap  - 1 x daily - 7 x weekly - 2 sets - 5 reps - 30 hold - Gastroc Stretch on Wall  - 1 x daily - 7 x weekly - 2 sets - 5 reps - 30 hold - Standing Bilateral Gastroc Stretch with Step  - 1 x daily - 7 x weekly - 2 sets - 5 reps - 30 hold - Step-Over  - 1 x daily - 7 x weekly - 3 sets - 10 reps - Alternating Step Taps with Counter Support  - 1 x daily - 7 x weekly - 3 sets - 10 reps - Industrial/product designer with Single Rail Using Step-To Pattern (One Step at a Time)  - 1 x daily - 7 x weekly - 1 sets - 2 reps - Forward Step Up  - 1 x daily - 7 x weekly - 3 sets - 10 reps    GOALS: Goals reviewed with patient? Yes  SHORT TERM GOALS: Target date: 07/04/2022  Pt will be independent with initial HEP with family supervision in order to build upon functional gains made in therapy. Baseline: Goal status: MET  2.  Pt will finish assessment of BERG with LTG written. Baseline: 31/56 on 8/7 Goal status: MET  3.  Pt will improve 5x sit<>stand to less than or equal to 19 sec with BUE support to demonstrate improved functional strength and transfer efficiency.  Baseline: 23.66 seconds with BUE support, 20.28 sec (8/23) Goal status: IN PROGRESS  4.  Pt will improve gait speed with no AD vs. LRAD to at least 2.1 ft/sec in order to demo improved  community mobility.  Baseline: 1.88 ft/sec with no AD, 2.42 ft/sec (8/23)  Goal status: MET  5.  Pt will improve TUG time to 22 seconds or less with no AD vs. LRAD in order to demo decrease fall risk. Baseline: 26.6 seconds no AD with min A, 16.97 sec no AD Supervision (8/23) Goal status: MET   LONG TERM GOALS: Target date: 08/01/2022  Pt will be independent with final HEP with family supervision in order to build upon functional gains made in therapy. Baseline:  Goal status: INITIAL  2.  Pt will improve Berg score to 40/56 for decreased fall risk Baseline: 31/56 on 8/7 Goal status: INITIAL  3.  Pt will improve TUG time to 15 seconds or less with no AD vs. LRAD in order to demo decrease fall risk. Baseline: 26.6 seconds no AD with min A, 16.97 sec no AD S* (8/23) Goal status: REVISED  4.  Pt will go up and down 4 steps with step to pattern and supervision with no handrail in order to safely enter/exit home. Baseline: Stairs not assessed - pt has 3 steps to get in and out without handrails Goal status: INITIAL  5.  Pt will improve gait speed with no AD vs. LRAD to at least 2.8 ft/sec in order to demo improved community mobility.  Baseline: 1.88 ft/sec with no AD, 2.42 ft/sec with no AD (8/23)  Goal status: REVISED  ASSESSMENT:  CLINICAL IMPRESSION: Emphasis of skilled PT session on continuing to work on attention to LLE during gait for heel strike and to avoid toe-walking. Gait continues to be improved with use of AFO vs no brace. Pt continues to require up to max verbal cueing to attend to LLE to maintain heel strike during gait. Education with pt and his brother regarding donning/doffing of AFO, see notes above. Pt continues to benefit from skilled therapy services to address gait and balance impairments. Continue POC.   OBJECTIVE IMPAIRMENTS Abnormal gait, decreased activity tolerance, decreased balance, decreased coordination, decreased endurance, decreased knowledge of use of  DME, decreased mobility, difficulty walking, decreased ROM, decreased strength, decreased safety awareness, impaired flexibility, impaired tone, impaired UE functional use, and postural dysfunction.   ACTIVITY LIMITATIONS carrying, standing, stairs, transfers, bathing, and locomotion level  PARTICIPATION LIMITATIONS: meal prep, cleaning, driving, community activity, and bowling  PERSONAL FACTORS Behavior pattern, Past/current experiences, Time since onset of injury/illness/exacerbation, and 3+ comorbidities: suprasellar germinoma status post chemo and radiation therapy at age 32 with LUE/LLE hemiparesis (patient has been cared for by his mother since this time and has had persistent gait difficulties and limitation of left arm ROM), HTN, HLD, anxiety, ADD, chronic ataxia.   are also affecting patient's functional outcome.   REHAB POTENTIAL: Fair due to chronicity of condition, pt has not received PT before.  CLINICAL DECISION MAKING: Evolving/moderate complexity  EVALUATION COMPLEXITY: Moderate  PLAN: PT FREQUENCY: 2x/week  PT DURATION: 8 weeks  PLANNED INTERVENTIONS: Therapeutic exercises, Therapeutic activity, Neuromuscular re-education, Balance training, Gait training, Patient/Family education, Self Care, Stair training, Vestibular training, Orthotic/Fit training, DME instructions, and Manual therapy  PLAN FOR NEXT SESSION:  Add to HEP as needed, strengthening,  ROM esp L hamstring and ankle (gave stretches 8/3); gait mechanics- anything for carryover of longer and equal step length with L heel contact, step-ups/downs (resisted?), backwards walking, treadmill    Excell Seltzer, PT, DPT, CSRS 07/10/22, 12:45 PM

## 2022-07-11 ENCOUNTER — Ambulatory Visit (INDEPENDENT_AMBULATORY_CARE_PROVIDER_SITE_OTHER): Payer: Medicare Other | Admitting: Podiatry

## 2022-07-11 ENCOUNTER — Other Ambulatory Visit: Payer: Self-pay | Admitting: Internal Medicine

## 2022-07-11 ENCOUNTER — Ambulatory Visit (INDEPENDENT_AMBULATORY_CARE_PROVIDER_SITE_OTHER): Payer: Medicare Other

## 2022-07-11 DIAGNOSIS — M21372 Foot drop, left foot: Secondary | ICD-10-CM

## 2022-07-11 DIAGNOSIS — M2042 Other hammer toe(s) (acquired), left foot: Secondary | ICD-10-CM | POA: Diagnosis not present

## 2022-07-11 DIAGNOSIS — M79674 Pain in right toe(s): Secondary | ICD-10-CM

## 2022-07-11 DIAGNOSIS — B351 Tinea unguium: Secondary | ICD-10-CM | POA: Diagnosis not present

## 2022-07-11 DIAGNOSIS — I6381 Other cerebral infarction due to occlusion or stenosis of small artery: Secondary | ICD-10-CM | POA: Diagnosis not present

## 2022-07-11 DIAGNOSIS — F4322 Adjustment disorder with anxiety: Secondary | ICD-10-CM

## 2022-07-11 DIAGNOSIS — M79675 Pain in left toe(s): Secondary | ICD-10-CM

## 2022-07-11 DIAGNOSIS — G40909 Epilepsy, unspecified, not intractable, without status epilepticus: Secondary | ICD-10-CM

## 2022-07-11 DIAGNOSIS — M21612 Bunion of left foot: Secondary | ICD-10-CM

## 2022-07-12 ENCOUNTER — Ambulatory Visit: Payer: Medicare Other

## 2022-07-13 ENCOUNTER — Ambulatory Visit: Payer: Medicare Other | Admitting: Physical Therapy

## 2022-07-13 DIAGNOSIS — M6281 Muscle weakness (generalized): Secondary | ICD-10-CM | POA: Diagnosis not present

## 2022-07-13 DIAGNOSIS — R2689 Other abnormalities of gait and mobility: Secondary | ICD-10-CM

## 2022-07-13 DIAGNOSIS — R2681 Unsteadiness on feet: Secondary | ICD-10-CM | POA: Diagnosis not present

## 2022-07-13 DIAGNOSIS — Z9181 History of falling: Secondary | ICD-10-CM | POA: Diagnosis not present

## 2022-07-13 NOTE — Therapy (Signed)
OUTPATIENT PHYSICAL THERAPY NEURO TREATMENT   Patient Name: Nicholas Holt MRN: [id number] DOB:11/16/1980, 41 y.o., male Today's Date: 07/13/2022   PCP: Earl Lagos, MD REFERRING PROVIDER: Tyson Alias, MD   PT End of Session - 07/13/22 1317     Visit Number 9    Number of Visits 17    Date for PT Re-Evaluation 08/05/22    Authorization Type Medicare - Part A & B    Progress Note Due on Visit 10    PT Start Time 1315    PT Stop Time 1357    PT Time Calculation (min) 42 min    Equipment Utilized During Treatment Gait belt;Other (comment)   L custom AFO   Activity Tolerance Patient tolerated treatment well    Behavior During Therapy [facility] for tasks assessed/performed;Impulsive                  Past Medical History:  Diagnosis Date   AKI (acute kidney injury) (HCC)    AKI (acute kidney injury) (HCC) 01/11/2021   Anxiety    Attention deficit disorder 08/08/2006   Bilateral leg cramps 11/25/2006   Blood transfusion without reported diagnosis 1997   Chemotherapy associated   Cough 12/08/2015   Elevated hemidiaphragm 07/09/2015   Left, presumably after portacath placement.    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  Complicated by left sided motor tic disorder and panhypopituitarism.    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  Includes central diabetes insipidus, secondary hypothyroidism, secondary adrenal insufficiency, and secondary hypogonadism.    Perennial allergic rhinitis 07/09/2015   Pre-diabetes     no meds   RENAL INSUFFICIENCY    Seizure disorder (HCC) 08/08/2006   Predated brain tumor.  Generalized seizure ocurred after running out of lorazepam post-tumor.    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 Treated with alendronate from 2009-2016. DEXA (03/16/2008): L Spine T -2.0. L fem neck T -0.9, R fem neck T -1.9.  DEXA (10/15/2014): L Spine T -1.3, L fem neck T -0.6, R fem neck T -1.8.  Bisphosphonate holiday 06/2015.  Repeat DEXA scan in 10/2016 to reassess.    Past Surgical History:  Procedure Laterality Date   BRAIN BIOPSY  11/14/1995   CSF SHUNT  11/14/1995   PORTACATH PLACEMENT     portacath removal     RADIOLOGY WITH ANESTHESIA N/A 08/30/2021   Procedure: MRI WITH ANESTHESIA BRAIN WITH AND WITHOUT CONTRAST;  Surgeon: Radiologist, Medication, MD;  Location: [facility] OR;  Service: Radiology;  Laterality: N/A;   RADIOLOGY WITH ANESTHESIA N/A 05/25/2022   Procedure: MRI WITH ANESTHESIA OF BRAIN WITH AND WITHOUT CONTRAST;  Surgeon: Radiologist, Medication, MD;  Location: [facility] OR;  Service: Radiology;  Laterality: N/A;   Patient Active Problem List   Diagnosis Date Noted   OSA (obstructive sleep apnea) 07/06/2022   Hyponatremia 05/30/2022   Multiple lacunar infarcts (HCC) 05/04/2022   Fall 04/13/2022   Hypertension 12/09/2019   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   Elevated hemidiaphragm 07/09/2015   Overweight 07/09/2015   Perennial allergic rhinitis 07/09/2015  Preventative health care 07/09/2015   H/O malignant neoplasm of brain 09/22/2014   Steroid-induced osteoporosis 03/27/2008   Bilateral leg cramps 11/25/2006   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 male with history of suprasellar germinoma at age 59 with LUE/LLE hemiparesis (patient has been cared for by his mother since this time and has had persistent gait difficulties and limitation of left arm ROM), HTN, HLD, anxiety, ADD, chronic ataxia. Patient walks on his toes at baseline and has been more unsteady than usual over the past few weeks.  Pt was directly admitted  on 05/29/22 for work-up of acutely worsening balance and ambulation with recent MRI showeing evidence of prior hemorrhages and lacunar infarcts in bilateral basal ganglia and cerebellum   PRECAUTIONS: Fall   PATIENT GOALS: Be able to bowl again every Saturday morning    SUBJECTIVE: Pt reports no pain today, no changes since last session. Pt's parents report they are now able to get shoe and brace on easier.                                                                                                                                                                                            Pt accompanied by: self, parents (in the lobby)   PAIN:  Are you having pain? No    TODAY'S TREATMENT:  GAIT: Ambulation on treadmill at 0.6-0.8 mph with focus on increasing gait speed, increasing step length with LLE, and increasing heel strike with LLE vs walking on toes. Pt able to ambulate up to 1 min before needing a seated rest break, has some anxiety with increase in speed. Utilized safety lanyard while on treadmill for safety.  Ambulation outdoors on sidewalk with CGA for safety and max cueing for L heel strike with fair adherence to cues due to increase in distractions in this environment. Pt continues to exhibit poor attention to LLE during gait.  STAIRS:  Level of Assistance: SBA  Stair  Negotiation Technique: Step to Pattern Alternating Pattern  Forwards with Bilateral Rails  Number of Stairs: 4   Height of Stairs: 6  Comments: cues for sequencing "up with the good, down with the bad"   THER ACT: Agility ladder forwards  step-to with progression to step-through with focus on increasing step length and L heel strike with gait.  In // bars:  -4" hurdles with 2 UE support, cues to increase step height for hurdle clearance  -circumduction with RLE>LLE   PATIENT EDUCATION: Education details: continue HEP, continue to work on heel strike with gait Person educated: Patient and Parent (parents in lobby) Education method: Explanation, Demonstration, Corporate treasurer cues, Verbal cues, and Handouts Education comprehension: verbalized understanding   HOME EXERCISE PROGRAM: Access Code: [facility] URL: [email or web address] Date: 06/15/2022 Prepared by: Excell Seltzer  Exercises - Seated Hamstring Stretch with Strap  - 1 x daily - 7 x weekly - 2 sets - 5 reps - 30 hold - Gastroc Stretch on Wall  - 1 x daily - 7 x weekly - 2 sets - 5 reps - 30 hold - Standing Bilateral Gastroc Stretch with Step  - 1 x daily - 7 x weekly - 2 sets - 5 reps - 30 hold - Step-Over  - 1 x daily - 7 x weekly - 3 sets - 10 reps - Alternating Step Taps with Counter Support  - 1 x daily - 7 x weekly - 3 sets - 10 reps - Industrial/product designer with Single Rail Using Step-To Pattern (One Step at a Time)  - 1 x daily - 7 x weekly - 1 sets - 2 reps - Forward Step Up  - 1 x daily - 7 x weekly - 3 sets - 10 reps    GOALS: Goals reviewed with patient? Yes  SHORT TERM GOALS: Target date: 07/04/2022  Pt will be independent with initial HEP with family supervision in order to build upon functional gains made in therapy. Baseline: Goal status: MET  2.  Pt will finish assessment of BERG with LTG written. Baseline: 31/56 on 8/7 Goal status: MET  3.  Pt will improve 5x sit<>stand to less than or equal  to 19 sec with BUE support to demonstrate improved functional strength and transfer efficiency.  Baseline: 23.66 seconds with BUE support, 20.28 sec (8/23) Goal status: IN PROGRESS  4.  Pt will improve gait speed with no AD vs. LRAD to at least 2.1 ft/sec in order to demo improved community mobility.  Baseline: 1.88 ft/sec with no AD, 2.42 ft/sec (8/23)  Goal status: MET  5.  Pt will improve TUG time to 22 seconds or less with no AD vs. LRAD in order to demo decrease fall risk. Baseline: 26.6 seconds no AD with min A, 16.97 sec no AD Supervision (8/23) Goal status: MET   LONG TERM GOALS: Target date: 08/01/2022  Pt will be independent with final HEP with family supervision in order to build upon functional gains made in therapy. Baseline:  Goal status: INITIAL  2.  Pt will improve Berg score to 40/56 for decreased fall risk Baseline: 31/56 on 8/7 Goal status: INITIAL  3.  Pt will improve TUG time to 15 seconds or less with no AD vs. LRAD in order to demo decrease fall risk. Baseline: 26.6 seconds no AD with min A, 16.97 sec no AD S* (8/23) Goal status: REVISED  4.  Pt will go up and down 4 steps with step to pattern and supervision with no handrail in order to safely enter/exit home. Baseline: Stairs not assessed - pt has 3 steps to get in and out without handrails Goal status: INITIAL  5.  Pt will improve gait speed with no AD vs. LRAD to at least 2.8 ft/sec in order  to demo improved community mobility.  Baseline: 1.88 ft/sec with no AD, 2.42 ft/sec with no AD (8/23)  Goal status: REVISED  ASSESSMENT:  CLINICAL IMPRESSION: Emphasis of skilled PT session on continuing to work on gait mechanics with heavy focus on LLE control and heel strike during gait. Pt continues to benefit from movement priming tasks prior to gait training and even still exhibits poor attention to LLE and ongoing toe-walking with limb. Continue POC.   OBJECTIVE IMPAIRMENTS Abnormal gait, decreased activity  tolerance, decreased balance, decreased coordination, decreased endurance, decreased knowledge of use of DME, decreased mobility, difficulty walking, decreased ROM, decreased strength, decreased safety awareness, impaired flexibility, impaired tone, impaired UE functional use, and postural dysfunction.   ACTIVITY LIMITATIONS carrying, standing, stairs, transfers, bathing, and locomotion level  PARTICIPATION LIMITATIONS: meal prep, cleaning, driving, community activity, and bowling  PERSONAL FACTORS Behavior pattern, Past/current experiences, Time since onset of injury/illness/exacerbation, and 3+ comorbidities: suprasellar germinoma status post chemo and radiation therapy at age 69 with LUE/LLE hemiparesis (patient has been cared for by his mother since this time and has had persistent gait difficulties and limitation of left arm ROM), HTN, HLD, anxiety, ADD, chronic ataxia.   are also affecting patient's functional outcome.   REHAB POTENTIAL: Fair due to chronicity of condition, pt has not received PT before.  CLINICAL DECISION MAKING: Evolving/moderate complexity  EVALUATION COMPLEXITY: Moderate  PLAN: PT FREQUENCY: 2x/week  PT DURATION: 8 weeks  PLANNED INTERVENTIONS: Therapeutic exercises, Therapeutic activity, Neuromuscular re-education, Balance training, Gait training, Patient/Family education, Self Care, Stair training, Vestibular training, Orthotic/Fit training, DME instructions, and Manual therapy  PLAN FOR NEXT SESSION:  Add to HEP as needed, strengthening, ROM esp L hamstring and ankle (gave stretches 8/3); gait mechanics- anything for carryover of longer and equal step length with L heel contact, step-ups/downs (resisted?), backwards walking, treadmill    Excell Seltzer, PT, DPT, CSRS 07/13/22, 1:59 PM

## 2022-07-14 NOTE — Progress Notes (Signed)
Subjective: Chief Complaint  Patient presents with   Diabetes    Diabetic foot exam    41 year old male presents the above concerns.  Presents for diabetic foot exam as well as for thick, elongated since he is unable to trim himself.  No swelling redness or drainage.  The brace that he got for the dropfoot has been helpful.  He presents today with his mom who is concerned that the bunions on the left foot and she is asking if the bunions were to be fixed when he still walk on his toes.  Last A1c was 5.8 on 05/29/2022   Objective: AAO x3, NAD DP/PT pulses palpable bilaterally, CRT less than 3 seconds Sensation decreased Nails are hypertrophic, dystrophic, brittle, discolored, elongated 10. No surrounding redness or drainage. Tenderness nails 1-5 bilaterally. No open lesions or pre-ulcerative lesions are identified today. Bunion present on the left foot with hammertoe contractures which are reviewed on the left foot.  There is no significant pain with this with his decreased range of motion of first MPJ. No pain with calf compression, swelling, warmth, erythema  Assessment: Left foot dropfoot, bunion/hammertoes; symptomatic onychomycosis  Plan: -All treatment options discussed with the patient including all alternatives, risks, complications.  -X-rays obtained reviewed of the left foot.  Severe bunion is present with hammertoe contractures.  There is no evidence of acute fracture. -We discussed with conservative as well as surgical treatment options.  We discussed possibly first imaging arthrodesis with hammertoe repair however I discussed that this is not going to change the gait and he needs to use the AFO ankle brace to help with the dropfoot and walking on the toes.  Discussed fixing the bunion hammertoes can help with the rubbing and irritation that he gets sometimes with shoes but otherwise is not going to change his gait from the standpoint of they are concerned about. -Sharply  debrided the nails x10 without any complications or bleeding. -Daily foot inspection -Patient encouraged to call the office with any questions, concerns, change in symptoms.   Trula Slade DPM

## 2022-07-18 ENCOUNTER — Ambulatory Visit
Payer: Medicare Other | Attending: Student in an Organized Health Care Education/Training Program | Admitting: Physical Therapy

## 2022-07-18 DIAGNOSIS — R2681 Unsteadiness on feet: Secondary | ICD-10-CM | POA: Diagnosis not present

## 2022-07-18 DIAGNOSIS — R2689 Other abnormalities of gait and mobility: Secondary | ICD-10-CM | POA: Diagnosis not present

## 2022-07-18 DIAGNOSIS — M6281 Muscle weakness (generalized): Secondary | ICD-10-CM | POA: Insufficient documentation

## 2022-07-18 DIAGNOSIS — Z9181 History of falling: Secondary | ICD-10-CM | POA: Diagnosis not present

## 2022-07-18 NOTE — Therapy (Signed)
OUTPATIENT PHYSICAL THERAPY NEURO TREATMENT   Patient Name: AZIAH BROSTROM MRN: [id number] DOB:[birth year], 41 y.o., male Today's Date: 07/18/2022   PCP: Aldine Contes, MD REFERRING PROVIDER: Axel Filler, MD  Physical Therapy Progress Note   Dates of Reporting Period: 06/05/2022 - 07/18/2022  See Note below for Objective Data and Assessment of Progress/Goals.  Thank you for the referral of this patient. Excell Seltzer, PT, DPT, CSRS    PT End of Session - 07/18/22 1020     Visit Number 10    Number of Visits 17    Date for PT Re-Evaluation 08/05/22    Authorization Type Medicare - Part A & B    Progress Note Due on Visit 10    PT Start Time 1018    PT Stop Time 1058    PT Time Calculation (min) 40 min    Equipment Utilized During Treatment Gait belt;Other (comment)   L custom AFO   Activity Tolerance Patient tolerated treatment well    Behavior During Therapy [facility] for tasks assessed/performed;Impulsive                   Past Medical History:  Diagnosis Date   AKI (acute kidney injury) ([place])    AKI (acute kidney injury) ([place]) 01/11/2021   Anxiety    Attention deficit disorder 08/08/2006   Bilateral leg cramps 11/25/2006   Blood transfusion without reported diagnosis 1997   Chemotherapy associated   Cough 12/08/2015   Elevated hemidiaphragm 07/09/2015   Left, presumably after portacath placement.    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  Complicated by left sided motor tic disorder and panhypopituitarism.    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  Includes central diabetes insipidus, secondary hypothyroidism, secondary adrenal insufficiency, and secondary hypogonadism.    Perennial allergic rhinitis 07/09/2015   Pre-diabetes    no meds   RENAL INSUFFICIENCY    Seizure disorder ([place]) 08/08/2006   Predated brain tumor.  Generalized seizure ocurred after running out of lorazepam post-tumor.    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 Treated with alendronate from 2009-2016. DEXA (03/16/2008): L Spine T -2.0. L fem neck T -0.9, R fem neck T -1.9.  DEXA (10/15/2014): L Spine T -1.3, L fem neck T -0.6, R fem neck T -1.8.  Bisphosphonate holiday 06/2015.  Repeat DEXA scan in 10/2016 to reassess.    Past Surgical History:  Procedure Laterality Date   BRAIN BIOPSY  11/14/1995   CSF SHUNT  11/14/1995   PORTACATH PLACEMENT     portacath removal     RADIOLOGY WITH ANESTHESIA N/A 08/30/2021   Procedure: MRI WITH ANESTHESIA BRAIN WITH AND WITHOUT CONTRAST;  Surgeon: Radiologist, Medication, MD;  Location: [place];  Service: Radiology;  Laterality: N/A;   RADIOLOGY WITH ANESTHESIA N/A 05/25/2022   Procedure: MRI WITH ANESTHESIA OF BRAIN WITH AND WITHOUT CONTRAST;  Surgeon: Radiologist, Medication, MD;  Location: [place];  Service: Radiology;  Laterality: N/A;   Patient Active Problem List   Diagnosis Date Noted   OSA (obstructive sleep apnea) 07/06/2022  Hyponatremia 05/30/2022   Multiple lacunar infarcts ([place]) 05/04/2022   Fall 04/13/2022   Hypertension 12/09/2019   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   Elevated hemidiaphragm 07/09/2015   Overweight 07/09/2015   Perennial allergic rhinitis 07/09/2015   Preventative health care 07/09/2015   H/O malignant neoplasm of brain 09/22/2014   Steroid-induced osteoporosis 03/27/2008   Bilateral leg cramps 11/25/2006   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 male with history of suprasellar germinoma at age 27 with LUE/LLE hemiparesis (patient has been cared for by his mother since this time and has had persistent gait difficulties and limitation of left arm ROM), HTN, HLD, anxiety, ADD, chronic ataxia. Patient walks on his toes at baseline and has been more unsteady than usual over the past few weeks.  Pt was directly admitted  on 05/29/22 for work-up of acutely worsening balance and ambulation with recent MRI showeing evidence of prior hemorrhages and lacunar infarcts in bilateral basal ganglia and cerebellum   PRECAUTIONS: Fall   PATIENT GOALS: Be able to bowl again every Saturday morning    SUBJECTIVE: Pt reports no changes since last session. No falls, no pains. Reports he has been doing his HEP and that "stairs are going great".                                                                                                                                                                                            Pt accompanied by: self, parents (in the lobby)  PAIN:  Are you having pain? No    TODAY'S TREATMENT:  GAIT: Gait following "priming" of L heel strike with therapeutic activities, pt exhibits improved heel strike initially but pt again reverts to toe-walking when distracted.  STAIRS:  Level of Assistance: SBA  Stair Negotiation Technique: Step to Pattern Forwards with Single Rail on Right  Number of Stairs: 8   Height of Stairs: 6  Comments: better adherence to  sequencing "up with the good, down with the bad", does need cues to make sure LLE fully on the step before continuing to ascend   THER ACT: In // bars:  -eccentric 4" step-downs with RUE support, 2 x 10 reps tapping to target with  focus on L heel strike  At mat table: -standing alt ball kicks with CGA for balance -progression to alt ball stop and kick with min A and RUE support on back of chair, difficulty lifting LLE up to stop ball -transition to seated alt LE ball stop and kick, CGA for trunk support in dynamic sitting with improved ability to lift LLE to stop ball before kicking  -sit to stand to wedge with focus on anterior lean with transfer, difficulty with this task (pt became anxious with difficulty in maintaining balance) so decreased amount of wedge under BLE   -x 10 reps with CGA for balance   PATIENT EDUCATION: Education details: continue HEP, continue to work on heel strike with gait Person educated: Patient and Parent (parents in lobby) Education method: Explanation, Media planner, Corporate treasurer cues, Verbal cues, and Handouts Education comprehension: verbalized understanding   HOME EXERCISE PROGRAM: Access Code: [facility] URL: [email or web address] Date: 06/15/2022 Prepared by: Excell Seltzer  Exercises - Seated Hamstring Stretch with Strap  - 1 x daily - 7 x weekly - 2 sets - 5 reps - 30 hold - Gastroc Stretch on Wall  - 1 x daily - 7 x weekly - 2 sets - 5 reps - 30 hold - Standing Bilateral Gastroc Stretch with Step  - 1 x daily - 7 x weekly - 2 sets - 5 reps - 30 hold - Step-Over  - 1 x daily - 7 x weekly - 3 sets - 10 reps - Alternating Step Taps with Counter Support  - 1 x daily - 7 x weekly - 3 sets - 10 reps - Industrial/product designer with Single Rail Using Step-To Pattern (One Step at a Time)  - 1 x daily - 7 x weekly - 1 sets - 2 reps - Forward Step Up  - 1 x daily - 7 x weekly - 3 sets - 10 reps    GOALS: Goals reviewed with patient? Yes  SHORT  TERM GOALS: Target date: 07/04/2022  Pt will be independent with initial HEP with family supervision in order to build upon functional gains made in therapy. Baseline: Goal status: MET  2.  Pt will finish assessment of BERG with LTG written. Baseline: 31/56 on 8/7 Goal status: MET  3.  Pt will improve 5x sit<>stand to less than or equal to 19 sec with BUE support to demonstrate improved functional strength and transfer efficiency.  Baseline: 23.66 seconds with BUE support, 20.28 sec (8/23) Goal status: IN PROGRESS  4.  Pt will improve gait speed with no AD vs. LRAD to at least 2.1 ft/sec in order to demo improved community mobility.  Baseline: 1.88 ft/sec with no AD, 2.42 ft/sec (8/23)  Goal status: MET  5.  Pt will improve TUG time to 22 seconds or less with no AD vs. LRAD in order to demo decrease fall risk. Baseline: 26.6 seconds no AD with min A, 16.97 sec no AD Supervision (8/23) Goal status: MET   LONG TERM GOALS: Target date: 08/01/2022  Pt will be independent with final HEP with family supervision in order to build upon functional gains made in therapy. Baseline:  Goal status: INITIAL  2.  Pt will improve Berg score to 40/56 for decreased fall risk Baseline: 31/56 on 8/7 Goal status: INITIAL  3.  Pt will improve TUG time to 15 seconds or less with no AD vs. LRAD in order to demo decrease fall risk. Baseline: 26.6 seconds no AD with min A, 16.97 sec no AD S* (  8/23) Goal status: REVISED  4.  Pt will go up and down 4 steps with step to pattern and supervision with no handrail in order to safely enter/exit home. Baseline: Stairs not assessed - pt has 3 steps to get in and out without handrails Goal status: INITIAL  5.  Pt will improve gait speed with no AD vs. LRAD to at least 2.8 ft/sec in order to demo improved community mobility.  Baseline: 1.88 ft/sec with no AD, 2.42 ft/sec with no AD (8/23)  Goal status: REVISED  ASSESSMENT:  CLINICAL IMPRESSION: Emphasis of  skilled PT session on working on priming movement of L heel strike to assess carryover to gait. Pt does exhibit good recall of stair management technique this session which he has not previously exhibited. However, despite priming movements of L heel strike during gait pt does eventually revert back to toe-walking with LLE, especially with onset of distractions. Pt continues to benefit from skilled therapy services to address ongoing gait and balance impairments. Continue POC.   OBJECTIVE IMPAIRMENTS Abnormal gait, decreased activity tolerance, decreased balance, decreased coordination, decreased endurance, decreased knowledge of use of DME, decreased mobility, difficulty walking, decreased ROM, decreased strength, decreased safety awareness, impaired flexibility, impaired tone, impaired UE functional use, and postural dysfunction.   ACTIVITY LIMITATIONS carrying, standing, stairs, transfers, bathing, and locomotion level  PARTICIPATION LIMITATIONS: meal prep, cleaning, driving, community activity, and bowling  PERSONAL FACTORS Behavior pattern, Past/current experiences, Time since onset of injury/illness/exacerbation, and 3+ comorbidities: suprasellar germinoma status post chemo and radiation therapy at age 8 with LUE/LLE hemiparesis (patient has been cared for by his mother since this time and has had persistent gait difficulties and limitation of left arm ROM), HTN, HLD, anxiety, ADD, chronic ataxia.   are also affecting patient's functional outcome.   REHAB POTENTIAL: Fair due to chronicity of condition, pt has not received PT before.  CLINICAL DECISION MAKING: Evolving/moderate complexity  EVALUATION COMPLEXITY: Moderate  PLAN: PT FREQUENCY: 2x/week  PT DURATION: 8 weeks  PLANNED INTERVENTIONS: Therapeutic exercises, Therapeutic activity, Neuromuscular re-education, Balance training, Gait training, Patient/Family education, Self Care, Stair training, Vestibular training, Orthotic/Fit  training, DME instructions, and Manual therapy  PLAN FOR NEXT SESSION:  Add to HEP as needed, strengthening, ROM esp L hamstring and ankle (gave stretches 8/3); gait mechanics- anything for carryover of longer and equal step length with L heel contact, step-ups/downs (resisted?), backwards walking, treadmill, L heel strike priming    Excell Seltzer, PT, DPT, CSRS 07/18/22, 10:59 AM

## 2022-07-20 ENCOUNTER — Ambulatory Visit: Payer: Medicare Other | Admitting: Physical Therapy

## 2022-07-20 DIAGNOSIS — R2681 Unsteadiness on feet: Secondary | ICD-10-CM | POA: Diagnosis not present

## 2022-07-20 DIAGNOSIS — M6281 Muscle weakness (generalized): Secondary | ICD-10-CM

## 2022-07-20 DIAGNOSIS — R2689 Other abnormalities of gait and mobility: Secondary | ICD-10-CM | POA: Diagnosis not present

## 2022-07-20 DIAGNOSIS — Z9181 History of falling: Secondary | ICD-10-CM | POA: Diagnosis not present

## 2022-07-20 NOTE — Therapy (Signed)
OUTPATIENT PHYSICAL THERAPY NEURO TREATMENT   Patient Name: Nicholas Holt MRN: [id number] DOB:Apr 07, 1981, 41 y.o., male Today's Date: 07/20/2022   PCP: Nicholas Contes, MD REFERRING PROVIDER: Axel Filler, MD     PT End of Session - 07/20/22 1105     Visit Number 11    Number of Visits 17    Date for PT Re-Evaluation 08/05/22    Authorization Type Medicare - Part A & B    Progress Note Due on Visit 20    PT Start Time 1100    PT Stop Time 1145    PT Time Calculation (min) 45 min    Equipment Utilized During Treatment Gait belt;Other (comment)   L custom AFO   Activity Tolerance Patient tolerated treatment well    Behavior During Therapy [facility] for tasks assessed/performed;Impulsive                    Past Medical History:  Diagnosis Date   AKI (acute kidney injury) ([place])    AKI (acute kidney injury) ([place]) 01/11/2021   Anxiety    Attention deficit disorder 08/08/2006   Bilateral leg cramps 11/25/2006   Blood transfusion without reported diagnosis 1997   Chemotherapy associated   Cough 12/08/2015   Elevated hemidiaphragm 07/09/2015   Left, presumably after portacath placement.    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  Complicated by left sided motor tic disorder and panhypopituitarism.    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  Includes central diabetes insipidus, secondary hypothyroidism, secondary adrenal insufficiency, and secondary hypogonadism.    Perennial allergic rhinitis 07/09/2015    Pre-diabetes    no meds   RENAL INSUFFICIENCY    Seizure disorder ([place]) 08/08/2006   Predated brain tumor.  Generalized seizure ocurred after running out of lorazepam post-tumor.    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 Treated with alendronate from 2009-2016. DEXA (03/16/2008): L Spine T -2.0. L fem neck T -0.9, R fem neck T -1.9.  DEXA (10/15/2014): L Spine T -1.3, L fem neck T -0.6, R fem neck T -1.8.  Bisphosphonate holiday 06/2015.  Repeat DEXA scan in 10/2016 to reassess.    Past Surgical History:  Procedure Laterality Date   BRAIN BIOPSY  11/14/1995   CSF SHUNT  11/14/1995   PORTACATH PLACEMENT     portacath removal     RADIOLOGY WITH ANESTHESIA N/A 08/30/2021   Procedure: MRI WITH ANESTHESIA BRAIN WITH AND WITHOUT CONTRAST;  Surgeon: Radiologist, Medication, MD;  Location: [place];  Service: Radiology;  Laterality: N/A;   RADIOLOGY WITH ANESTHESIA N/A 05/25/2022   Procedure: MRI WITH ANESTHESIA OF BRAIN WITH AND WITHOUT CONTRAST;  Surgeon: Radiologist, Medication, MD;  Location: [place];  Service: Radiology;  Laterality: N/A;   Patient Active Problem List   Diagnosis Date Noted   OSA (obstructive sleep apnea) 07/06/2022   Hyponatremia 05/30/2022   Multiple lacunar infarcts ([place]) 05/04/2022   Fall 04/13/2022   Hypertension 12/09/2019   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   Elevated hemidiaphragm 07/09/2015   Overweight 07/09/2015   Perennial  allergic rhinitis 07/09/2015   Preventative health care 07/09/2015   H/O malignant neoplasm of brain 09/22/2014   Steroid-induced osteoporosis 03/27/2008   Bilateral leg cramps 11/25/2006   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 male with history of suprasellar germinoma at age 32 with LUE/LLE hemiparesis (patient has been cared for by his mother since this time and has had persistent gait difficulties and limitation of left arm ROM), HTN, HLD, anxiety, ADD, chronic ataxia. Patient walks on his toes at baseline and has been more unsteady than usual over the past few weeks.  Pt was directly admitted  on 05/29/22 for work-up of acutely worsening balance and ambulation with recent MRI showeing evidence of prior hemorrhages and lacunar infarcts in bilateral basal ganglia and cerebellum   PRECAUTIONS: Fall   PATIENT GOALS: Be able to bowl again every Saturday morning    SUBJECTIVE: Pt reports he is feeling blessed today. No changes since last session.                                                                                                                                                                                            Pt accompanied by: self, parents (in the lobby)  PAIN:  Are you having pain? No    TODAY'S TREATMENT:  GAIT: On treadmill at 1.0 mph, 3 x 30 sec before onset of anxiety and unable to continue, requests to stop. Pt continues to exhibit LLE toe walking, only able to maintain heel strike with max cueing for up to 2 steps before reverting to toe-walking.  On treadmill at 0.5 mph x 2:30 min, x 2:00 min while performing alt L/R ball kicks with improved ability to perform with good LLE clearance. Pt exhibits improved ability to perform this activity this date as compared to previous attempts during PT sessions.  Following gait on treadmill:  Gait pattern:  L toe-walking, decreased step length- Left, decreased stance time- Right, decreased stride length,  decreased hip/knee flexion- Left, and decreased ankle dorsiflexion- Left Distance walked: 200 ft Assistive device utilized: None Level of assistance: Modified independence Comments: improved adherence to L heel strike following gait on treadmill; does need min cueing for "heel" or "left" to recall  to perform L heel strike with gait; pt also exhibits decreased step length with LLE   Gait pattern:  L toe-walking, decreased step length- Left, decreased stance time- Right, decreased hip/knee flexion- Left, and decreased ankle dorsiflexion- Left Distance walked: 200 ft Assistive device utilized: None Level of assistance: Modified independence Comments: 2nd bout of ambulation with verbal cues for "heel" and "big step" to increase LLE step length. Pt exhibits improved overall gait mechanics when able to adhere to cues.    THER EX: Seated HS stretch with use of gait belt, 3 x 60 sec Standing gastroc stretch at stairs, 3 x 60 sec    PATIENT EDUCATION: Education details: continue HEP, continue to work on heel strike with gait with cues for "heel" and "big step" Person educated: Patient and Parent (parents in lobby) Education method: Explanation, Demonstration, Tactile cues, Verbal cues, and Handouts Education comprehension: verbalized understanding   HOME EXERCISE PROGRAM: Access Code: [facility] URL: [email or web address] Date: 06/15/2022 Prepared by: Excell Seltzer  Exercises - Seated Hamstring Stretch with Strap  - 1 x daily - 7 x weekly - 2 sets - 5 reps - 30 hold - Gastroc Stretch on Wall  - 1 x daily - 7 x weekly - 2 sets - 5 reps - 30 hold - Standing Bilateral Gastroc Stretch with Step  - 1 x daily - 7 x weekly - 2 sets - 5 reps - 30 hold - Step-Over  - 1 x daily - 7 x weekly - 3 sets - 10 reps - Alternating Step Taps with Counter Support  - 1 x daily - 7 x weekly - 3 sets - 10 reps - Industrial/product designer with Single Rail Using Step-To Pattern (One Step at a Time)  - 1 x  daily - 7 x weekly - 1 sets - 2 reps - Forward Step Up  - 1 x daily - 7 x weekly - 3 sets - 10 reps    GOALS: Goals reviewed with patient? Yes  SHORT TERM GOALS: Target date: 07/04/2022  Pt will be independent with initial HEP with family supervision in order to build upon functional gains made in therapy. Baseline: Goal status: MET  2.  Pt will finish assessment of BERG with LTG written. Baseline: 31/56 on 8/7 Goal status: MET  3.  Pt will improve 5x sit<>stand to less than or equal to 19 sec with BUE support to demonstrate improved functional strength and transfer efficiency.  Baseline: 23.66 seconds with BUE support, 20.28 sec (8/23) Goal status: IN PROGRESS  4.  Pt will improve gait speed with no AD vs. LRAD to at least 2.1 ft/sec in order to demo improved community mobility.  Baseline: 1.88 ft/sec with no AD, 2.42 ft/sec (8/23)  Goal status: MET  5.  Pt will improve TUG time to 22 seconds or less with no AD vs. LRAD in order to demo decrease fall risk. Baseline: 26.6 seconds no AD with min A, 16.97 sec no AD Supervision (8/23) Goal status: MET   LONG TERM GOALS: Target date: 08/01/2022  Pt will be independent with final HEP with family supervision in order to build upon functional gains made in therapy. Baseline:  Goal status: INITIAL  2.  Pt will improve Berg score to 40/56 for decreased fall risk Baseline: 31/56 on 8/7 Goal status: INITIAL  3.  Pt will improve TUG time to 15 seconds or less with no AD vs. LRAD in order to demo decrease fall risk. Baseline: 26.6 seconds no AD with  min A, 16.97 sec no AD S* (8/23) Goal status: REVISED  4.  Pt will go up and down 4 steps with step to pattern and supervision with no handrail in order to safely enter/exit home. Baseline: Stairs not assessed - pt has 3 steps to get in and out without handrails Goal status: INITIAL  5.  Pt will improve gait speed with no AD vs. LRAD to at least 2.8 ft/sec in order to demo improved  community mobility.  Baseline: 1.88 ft/sec with no AD, 2.42 ft/sec with no AD (8/23)  Goal status: REVISED  ASSESSMENT:  CLINICAL IMPRESSION: Emphasis of skilled PT session on continuing to work on L heel strike during gait and improving overall gait mechanics. Had pt perform stretching to L hamstring and gastroc prior to gait initiation for improved mobility. Pt exhibits improved ability to perform gait on treadmill this date, especially with performing alt L/R ball kicks during gait. Pt exhibits fair carryover of improved gait mechanics on treadmill to over-ground ambulation but demonstrates and improvement from previous sessions. Pt continues to benefit from skilled therapy services to address ongoing gait and balance impairments. Continue POC.   OBJECTIVE IMPAIRMENTS Abnormal gait, decreased activity tolerance, decreased balance, decreased coordination, decreased endurance, decreased knowledge of use of DME, decreased mobility, difficulty walking, decreased ROM, decreased strength, decreased safety awareness, impaired flexibility, impaired tone, impaired UE functional use, and postural dysfunction.   ACTIVITY LIMITATIONS carrying, standing, stairs, transfers, bathing, and locomotion level  PARTICIPATION LIMITATIONS: meal prep, cleaning, driving, community activity, and bowling  PERSONAL FACTORS Behavior pattern, Past/current experiences, Time since onset of injury/illness/exacerbation, and 3+ comorbidities: suprasellar germinoma status post chemo and radiation therapy at age 72 with LUE/LLE hemiparesis (patient has been cared for by his mother since this time and has had persistent gait difficulties and limitation of left arm ROM), HTN, HLD, anxiety, ADD, chronic ataxia.   are also affecting patient's functional outcome.   REHAB POTENTIAL: Fair due to chronicity of condition, pt has not received PT before.  CLINICAL DECISION MAKING: Evolving/moderate complexity  EVALUATION COMPLEXITY:  Moderate  PLAN: PT FREQUENCY: 2x/week  PT DURATION: 8 weeks  PLANNED INTERVENTIONS: Therapeutic exercises, Therapeutic activity, Neuromuscular re-education, Balance training, Gait training, Patient/Family education, Self Care, Stair training, Vestibular training, Orthotic/Fit training, DME instructions, and Manual therapy  PLAN FOR NEXT SESSION:  Add to HEP as needed, strengthening, ROM esp L hamstring and ankle (gave stretches 8/3); gait mechanics- anything for carryover of longer and equal step length with L heel contact, step-ups/downs (resisted?), backwards walking, treadmill ball kicks, L heel strike priming    Excell Seltzer, PT, DPT, CSRS 07/20/22, 11:47 AM

## 2022-07-23 ENCOUNTER — Encounter (HOSPITAL_BASED_OUTPATIENT_CLINIC_OR_DEPARTMENT_OTHER): Payer: Medicare Other | Admitting: Internal Medicine

## 2022-07-24 ENCOUNTER — Ambulatory Visit: Payer: Medicare Other | Admitting: Physical Therapy

## 2022-07-24 DIAGNOSIS — M6281 Muscle weakness (generalized): Secondary | ICD-10-CM | POA: Diagnosis not present

## 2022-07-24 DIAGNOSIS — Z9181 History of falling: Secondary | ICD-10-CM | POA: Diagnosis not present

## 2022-07-24 DIAGNOSIS — R2689 Other abnormalities of gait and mobility: Secondary | ICD-10-CM | POA: Diagnosis not present

## 2022-07-24 DIAGNOSIS — R2681 Unsteadiness on feet: Secondary | ICD-10-CM

## 2022-07-24 NOTE — Therapy (Signed)
OUTPATIENT PHYSICAL THERAPY NEURO TREATMENT   Patient Name: Nicholas Holt MRN: [id number] DOB:03-May-1981, 41 y.o., male Today's Date: 07/24/2022   PCP: Nicholas Lagos, MD REFERRING PROVIDER: Tyson Alias, MD     PT End of Session - 07/24/22 1106     Visit Number 12    Number of Visits 17    Date for PT Re-Evaluation 08/05/22    Authorization Type Medicare - Part A & B    Progress Note Due on Visit 20    PT Start Time 1102    PT Stop Time 1145    PT Time Calculation (min) 43 min    Equipment Utilized During Treatment Gait belt;Other (comment)   L custom AFO   Activity Tolerance Patient tolerated treatment well    Behavior During Therapy [facility] for tasks assessed/performed;Impulsive                     Past Medical History:  Diagnosis Date   AKI (acute kidney injury) (HCC)    AKI (acute kidney injury) (HCC) 01/11/2021   Anxiety    Attention deficit disorder 08/08/2006   Bilateral leg cramps 11/25/2006   Blood transfusion without reported diagnosis 1997   Chemotherapy associated   Cough 12/08/2015   Elevated hemidiaphragm 07/09/2015   Left, presumably after portacath placement.    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  Complicated by left sided motor tic disorder and panhypopituitarism.    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  Includes central diabetes insipidus, secondary hypothyroidism, secondary adrenal insufficiency, and secondary hypogonadism.    Perennial allergic rhinitis 07/09/2015    Pre-diabetes    no meds   RENAL INSUFFICIENCY    Seizure disorder (HCC) 08/08/2006   Predated brain tumor.  Generalized seizure ocurred after running out of lorazepam post-tumor.    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 Treated with alendronate from 2009-2016. DEXA (03/16/2008): L Spine T -2.0. L fem neck T -0.9, R fem neck T -1.9.  DEXA (10/15/2014): L Spine T -1.3, L fem neck T -0.6, R fem neck T -1.8.  Bisphosphonate holiday 06/2015.  Repeat DEXA scan in 10/2016 to reassess.    Past Surgical History:  Procedure Laterality Date   BRAIN BIOPSY  11/14/1995   CSF SHUNT  11/14/1995   PORTACATH PLACEMENT     portacath removal     RADIOLOGY WITH ANESTHESIA N/A 08/30/2021   Procedure: MRI WITH ANESTHESIA BRAIN WITH AND WITHOUT CONTRAST;  Surgeon: Radiologist, Medication, MD;  Location: [facility] OR;  Service: Radiology;  Laterality: N/A;   RADIOLOGY WITH ANESTHESIA N/A 05/25/2022   Procedure: MRI WITH ANESTHESIA OF BRAIN WITH AND WITHOUT CONTRAST;  Surgeon: Radiologist, Medication, MD;  Location: [facility] OR;  Service: Radiology;  Laterality: N/A;   Patient Active Problem List   Diagnosis Date Noted   OSA (obstructive sleep apnea) 07/06/2022   Hyponatremia 05/30/2022   Multiple lacunar infarcts (HCC) 05/04/2022   Fall 04/13/2022   Hypertension 12/09/2019   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   Elevated hemidiaphragm 07/09/2015   Overweight 07/09/2015  Perennial allergic rhinitis 07/09/2015   Preventative health care 07/09/2015   H/O malignant neoplasm of brain 09/22/2014   Steroid-induced osteoporosis 03/27/2008   Bilateral leg cramps 11/25/2006   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 male with history of suprasellar germinoma at age 14 with LUE/LLE hemiparesis (patient has been cared for by his mother since this time and has had persistent gait difficulties and limitation of left arm ROM), HTN, HLD, anxiety, ADD, chronic ataxia. Patient walks on his toes at baseline and has been more unsteady than usual over the past few weeks.  Pt was directly admitted  on 05/29/22 for work-up of acutely worsening balance and ambulation with recent MRI showeing evidence of prior hemorrhages and lacunar infarcts in bilateral basal ganglia and cerebellum   PRECAUTIONS: Fall   PATIENT GOALS: Be able to bowl again every Saturday morning    SUBJECTIVE: Pt's mom asking if pt can wear his brace places other than therapy, they have been avoiding community outings due to the hot weather. Encouraged them to try walking the grocery store, etc to see how he does with the brace in the community. Pt able to maintain heel strike 50% of the time with gait during therapy sessions but with poor carryover to mobility at home with parents per report.                                                                                                                                                                                            Pt accompanied by: self, parents (in the lobby)  PAIN:  Are you having pain? No    TODAY'S TREATMENT:  GAIT: On treadmill at 0.5-0.7 mph x 2:30, x 1:00 min while performing alt L/R ball kicks for increased LLE clearance and step length. Pt exhibits increased difficulty maintaining consistency with ball kicks with increase in speed to 0.7 vs 0.5 mph. Pt also exhibits decreased attention to LLE during gait this date, requires  ongoing mod to max cueing to attend to limb while on treadmill and with gait around the clinic.  Gait outdoors across uneven sidewalk with CGA to min A, max verbal cueing for L heel strike, poor ability  to maintain heel strike for more than one step outdoors. Pt also continues to exhibit decreased ability to maintain L heel strike even with normal gait across level ground indoors this date.  THER EX: Seated HS stretch with use of gait belt, 3 x 30 sec Standing gastroc stretch at stairs, 3 x 60 sec  Standing 2" step eccentric step downs with RLE with focus on L heel maintaining contact with step  NMR: Standing LLE steps onto colored dots to work on increasing step length varoius directions, RLE static on one dot. Pt exhibits poor carryover of increased step length to gait following this task.   PATIENT EDUCATION: Education details: continue HEP, continue to work on heel strike with gait with cues for "heel" and "big step" Person educated: Patient and Parent (parents in lobby) Education method: Explanation, Demonstration, Tactile cues, Verbal cues, and Handouts Education comprehension: verbalized understanding   HOME EXERCISE PROGRAM: Access Code: [facility] URL: [email or web address] Date: 06/15/2022 Prepared by: Excell Seltzer  Exercises - Seated Hamstring Stretch with Strap  - 1 x daily - 7 x weekly - 2 sets - 5 reps - 30 hold - Gastroc Stretch on Wall  - 1 x daily - 7 x weekly - 2 sets - 5 reps - 30 hold - Standing Bilateral Gastroc Stretch with Step  - 1 x daily - 7 x weekly - 2 sets - 5 reps - 30 hold - Step-Over  - 1 x daily - 7 x weekly - 3 sets - 10 reps - Alternating Step Taps with Counter Support  - 1 x daily - 7 x weekly - 3 sets - 10 reps - Industrial/product designer with Single Rail Using Step-To Pattern (One Step at a Time)  - 1 x daily - 7 x weekly - 1 sets - 2 reps - Forward Step Up  - 1 x daily - 7 x weekly - 3 sets - 10 reps    GOALS: Goals reviewed with  patient? Yes  SHORT TERM GOALS: Target date: 07/04/2022  Pt will be independent with initial HEP with family supervision in order to build upon functional gains made in therapy. Baseline: Goal status: MET  2.  Pt will finish assessment of BERG with LTG written. Baseline: 31/56 on 8/7 Goal status: MET  3.  Pt will improve 5x sit<>stand to less than or equal to 19 sec with BUE support to demonstrate improved functional strength and transfer efficiency.  Baseline: 23.66 seconds with BUE support, 20.28 sec (8/23) Goal status: IN PROGRESS  4.  Pt will improve gait speed with no AD vs. LRAD to at least 2.1 ft/sec in order to demo improved community mobility.  Baseline: 1.88 ft/sec with no AD, 2.42 ft/sec (8/23)  Goal status: MET  5.  Pt will improve TUG time to 22 seconds or less with no AD vs. LRAD in order to demo decrease fall risk. Baseline: 26.6 seconds no AD with min A, 16.97 sec no AD Supervision (8/23) Goal status: MET   LONG TERM GOALS: Target date: 08/01/2022  Pt will be independent with final HEP with family supervision in order to build upon functional gains made in therapy. Baseline:  Goal status: INITIAL  2.  Pt will improve Berg score to 40/56 for decreased fall risk Baseline: 31/56 on 8/7 Goal status: INITIAL  3.  Pt will improve TUG time to 15 seconds or less with no AD vs. LRAD in order to demo decrease fall risk. Baseline: 26.6 seconds no AD with min  A, 16.97 sec no AD S* (8/23) Goal status: REVISED  4.  Pt will go up and down 4 steps with step to pattern and supervision with no handrail in order to safely enter/exit home. Baseline: Stairs not assessed - pt has 3 steps to get in and out without handrails Goal status: INITIAL  5.  Pt will improve gait speed with no AD vs. LRAD to at least 2.8 ft/sec in order to demo improved community mobility.  Baseline: 1.88 ft/sec with no AD, 2.42 ft/sec with no AD (8/23)  Goal status: REVISED  ASSESSMENT:  CLINICAL  IMPRESSION: Emphasis of skilled PT session on continuing to work on increasing L heel strike and step length with gait. Pt exhibits decreased ability to maintain L heel strike this date and requires max cueing to attend to limb during session for correct gait mechanics. Pt continues to benefit from skilled therapy services to address ongoing gait and balance impairments. Continue POC.   OBJECTIVE IMPAIRMENTS Abnormal gait, decreased activity tolerance, decreased balance, decreased coordination, decreased endurance, decreased knowledge of use of DME, decreased mobility, difficulty walking, decreased ROM, decreased strength, decreased safety awareness, impaired flexibility, impaired tone, impaired UE functional use, and postural dysfunction.   ACTIVITY LIMITATIONS carrying, standing, stairs, transfers, bathing, and locomotion level  PARTICIPATION LIMITATIONS: meal prep, cleaning, driving, community activity, and bowling  PERSONAL FACTORS Behavior pattern, Past/current experiences, Time since onset of injury/illness/exacerbation, and 3+ comorbidities: suprasellar germinoma status post chemo and radiation therapy at age 68 with LUE/LLE hemiparesis (patient has been cared for by his mother since this time and has had persistent gait difficulties and limitation of left arm ROM), HTN, HLD, anxiety, ADD, chronic ataxia.   are also affecting patient's functional outcome.   REHAB POTENTIAL: Fair due to chronicity of condition, pt has not received PT before.  CLINICAL DECISION MAKING: Evolving/moderate complexity  EVALUATION COMPLEXITY: Moderate  PLAN: PT FREQUENCY: 2x/week  PT DURATION: 8 weeks  PLANNED INTERVENTIONS: Therapeutic exercises, Therapeutic activity, Neuromuscular re-education, Balance training, Gait training, Patient/Family education, Self Care, Stair training, Vestibular training, Orthotic/Fit training, DME instructions, and Manual therapy  PLAN FOR NEXT SESSION:  Add to HEP as needed,  strengthening, ROM esp L hamstring and ankle (gave stretches 8/3); gait mechanics- anything for carryover of longer and equal step length with L heel contact, step-ups/downs (resisted?), backwards walking, treadmill ball kicks, L heel strike priming, dot taps, standing on wedge/block   Excell Seltzer, PT, DPT, CSRS 07/24/22, 12:01 PM

## 2022-07-26 ENCOUNTER — Ambulatory Visit: Payer: Medicare Other | Admitting: Physical Therapy

## 2022-07-26 DIAGNOSIS — R2681 Unsteadiness on feet: Secondary | ICD-10-CM | POA: Diagnosis not present

## 2022-07-26 DIAGNOSIS — Z9181 History of falling: Secondary | ICD-10-CM

## 2022-07-26 DIAGNOSIS — R2689 Other abnormalities of gait and mobility: Secondary | ICD-10-CM | POA: Diagnosis not present

## 2022-07-26 DIAGNOSIS — M6281 Muscle weakness (generalized): Secondary | ICD-10-CM

## 2022-07-26 NOTE — Therapy (Signed)
OUTPATIENT PHYSICAL THERAPY NEURO TREATMENT   Patient Name: Nicholas Holt MRN: [id number] DOB:31-May-1981, 41 y.o., male Today's Date: 07/26/2022   PCP: Earl Lagos, MD REFERRING PROVIDER: Tyson Alias, MD     PT End of Session - 07/26/22 1452     Visit Number 13    Number of Visits 17    Date for PT Re-Evaluation 08/05/22    Authorization Type Medicare - Part A & B    Progress Note Due on Visit 20    PT Start Time 1450    PT Stop Time 1530    PT Time Calculation (min) 40 min    Equipment Utilized During Treatment Gait belt;Other (comment)   L custom AFO   Activity Tolerance Patient tolerated treatment well    Behavior During Therapy [facility] for tasks assessed/performed;Impulsive                      Past Medical History:  Diagnosis Date   AKI (acute kidney injury) (HCC)    AKI (acute kidney injury) (HCC) 01/11/2021   Anxiety    Attention deficit disorder 08/08/2006   Bilateral leg cramps 11/25/2006   Blood transfusion without reported diagnosis 1997   Chemotherapy associated   Cough 12/08/2015   Elevated hemidiaphragm 07/09/2015   Left, presumably after portacath placement.    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  Complicated by left sided motor tic disorder and panhypopituitarism.    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  Includes central diabetes insipidus, secondary hypothyroidism, secondary adrenal insufficiency, and secondary hypogonadism.    Perennial allergic rhinitis 07/09/2015    Pre-diabetes    no meds   RENAL INSUFFICIENCY    Seizure disorder (HCC) 08/08/2006   Predated brain tumor.  Generalized seizure ocurred after running out of lorazepam post-tumor.    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 Treated with alendronate from 2009-2016. DEXA (03/16/2008): L Spine T -2.0. L fem neck T -0.9, R fem neck T -1.9.  DEXA (10/15/2014): L Spine T -1.3, L fem neck T -0.6, R fem neck T -1.8.  Bisphosphonate holiday 06/2015.  Repeat DEXA scan in 10/2016 to reassess.    Past Surgical History:  Procedure Laterality Date   BRAIN BIOPSY  11/14/1995   CSF SHUNT  11/14/1995   PORTACATH PLACEMENT     portacath removal     RADIOLOGY WITH ANESTHESIA N/A 08/30/2021   Procedure: MRI WITH ANESTHESIA BRAIN WITH AND WITHOUT CONTRAST;  Surgeon: Radiologist, Medication, MD;  Location: [facility] OR;  Service: Radiology;  Laterality: N/A;   RADIOLOGY WITH ANESTHESIA N/A 05/25/2022   Procedure: MRI WITH ANESTHESIA OF BRAIN WITH AND WITHOUT CONTRAST;  Surgeon: Radiologist, Medication, MD;  Location: [facility] OR;  Service: Radiology;  Laterality: N/A;   Patient Active Problem List   Diagnosis Date Noted   OSA (obstructive sleep apnea) 07/06/2022   Hyponatremia 05/30/2022   Multiple lacunar infarcts (HCC) 05/04/2022   Fall 04/13/2022   Hypertension 12/09/2019   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   Elevated hemidiaphragm 07/09/2015   Overweight 07/09/2015  Perennial allergic rhinitis 07/09/2015   Preventative health care 07/09/2015   H/O malignant neoplasm of brain 09/22/2014   Steroid-induced osteoporosis 03/27/2008   Bilateral leg cramps 11/25/2006   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) ([place]) 08/08/2006   Hyperlipidemia 08/08/2006   Attention deficit disorder 08/08/2006   Seizure disorder ([place]) 08/08/2006    ONSET DATE: 05/30/2022  REFERRING DIAG: R53.1 (ICD-10-CM) - Weakness I63.81 (ICD-10-CM) - Multiple lacunar  infarcts (HCC)  THERAPY DIAG:  Unsteadiness on feet  Muscle weakness (generalized)  Other abnormalities of gait and mobility  History of falling  Rationale for Evaluation and Treatment Rehabilitation  PERTINENT HISTORY: [name] is a 41 y.o. male with history of suprasellar germinoma at age 51 with LUE/LLE hemiparesis (patient has been cared for by his mother since this time and has had persistent gait difficulties and limitation of left arm ROM), HTN, HLD, anxiety, ADD, chronic ataxia. Patient walks on his toes at baseline and has been more unsteady than usual over the past few weeks.  Pt was directly admitted  on 05/29/22 for work-up of acutely worsening balance and ambulation with recent MRI showeing evidence of prior hemorrhages and lacunar infarcts in bilateral basal ganglia and cerebellum   PRECAUTIONS: Fall   PATIENT GOALS: Be able to bowl again every Saturday morning    SUBJECTIVE: Pt reports things are going well. He reports he was able to go to [employer] with his family and that he did well walking around the store, no trips or falls in this environment.                                                                                                                                                                                            Pt accompanied by: self, parents (in the lobby)  PAIN:  Are you having pain? No    TODAY'S TREATMENT:  GAIT: Gait following work on heel strike and stretching during session with fair carryover of heel strike during gait. Pt exhibits decreased ability to attend to L heel strike with turns and when distracted by environment. Encouraged pt to attend to LLE during gait in therapy clinic and at home/in community environment.  NMR: 4 square stepping with min HHA for backwards stepping, x 6 rounds each direction. Pt continues to exhibit difficulty with lateral stepping and clearing LE with this.  Standing with LLE on wedge while  reaching for cones with alt UE outside BOS and across midline to encourage DF of foot in WB position  Standing with LLE on wedge while performing ball toss with progression to ball bounce, encouragement to utilize BUE in order  to toss the ball due to learned non-use of LUE  4" eccentric alt L/R step-downs with one RUE support and CGA for balance, focus on heel strike on ground with step-down   PATIENT EDUCATION: Education details: continue HEP, continue to work on heel strike with gait with cues for "heel" and "big step", initiated convo about PT POC and d/c next week Person educated: Patient and Parent (parents in lobby) Education method: Explanation, Demonstration, Corporate treasurer cues, Verbal cues, and Handouts Education comprehension: verbalized understanding   HOME EXERCISE PROGRAM: Access Code: [facility] URL: [email or web address] Date: 06/15/2022 Prepared by: Excell Seltzer  Exercises - Seated Hamstring Stretch with Strap  - 1 x daily - 7 x weekly - 2 sets - 5 reps - 30 hold - Gastroc Stretch on Wall  - 1 x daily - 7 x weekly - 2 sets - 5 reps - 30 hold - Standing Bilateral Gastroc Stretch with Step  - 1 x daily - 7 x weekly - 2 sets - 5 reps - 30 hold - Step-Over  - 1 x daily - 7 x weekly - 3 sets - 10 reps - Alternating Step Taps with Counter Support  - 1 x daily - 7 x weekly - 3 sets - 10 reps - Industrial/product designer with Single Rail Using Step-To Pattern (One Step at a Time)  - 1 x daily - 7 x weekly - 1 sets - 2 reps - Forward Step Up  - 1 x daily - 7 x weekly - 3 sets - 10 reps    GOALS: Goals reviewed with patient? Yes  SHORT TERM GOALS: Target date: 07/04/2022  Pt will be independent with initial HEP with family supervision in order to build upon functional gains made in therapy. Baseline: Goal status: MET  2.  Pt will finish assessment of BERG with LTG written. Baseline: 31/56 on 8/7 Goal status: MET  3.  Pt will improve 5x sit<>stand to less than or equal  to 19 sec with BUE support to demonstrate improved functional strength and transfer efficiency.  Baseline: 23.66 seconds with BUE support, 20.28 sec (8/23) Goal status: IN PROGRESS  4.  Pt will improve gait speed with no AD vs. LRAD to at least 2.1 ft/sec in order to demo improved community mobility.  Baseline: 1.88 ft/sec with no AD, 2.42 ft/sec (8/23)  Goal status: MET  5.  Pt will improve TUG time to 22 seconds or less with no AD vs. LRAD in order to demo decrease fall risk. Baseline: 26.6 seconds no AD with min A, 16.97 sec no AD Supervision (8/23) Goal status: MET   LONG TERM GOALS: Target date: 08/01/2022  Pt will be independent with final HEP with family supervision in order to build upon functional gains made in therapy. Baseline:  Goal status: INITIAL  2.  Pt will improve Berg score to 40/56 for decreased fall risk Baseline: 31/56 on 8/7 Goal status: INITIAL  3.  Pt will improve TUG time to 15 seconds or less with no AD vs. LRAD in order to demo decrease fall risk. Baseline: 26.6 seconds no AD with min A, 16.97 sec no AD S* (8/23) Goal status: REVISED  4.  Pt will go up and down 4 steps with step to pattern and supervision with no handrail in order to safely enter/exit home. Baseline: Stairs not assessed - pt has 3 steps to get in and out without handrails Goal status: INITIAL  5.  Pt will improve gait speed with no AD vs.  LRAD to at least 2.8 ft/sec in order to demo improved community mobility.  Baseline: 1.88 ft/sec with no AD, 2.42 ft/sec with no AD (8/23)  Goal status: REVISED  ASSESSMENT:  CLINICAL IMPRESSION: Emphasis of skilled PT session on continuing to work on L ankle ROM and heel strike to improve gait mechanics. Pt continues to exhibit fair carryover of techniques worked on during therapy session to his ambulation due to poor attention to LLE with mobility. Initiated conversation with patient and his parents that pt is nearing end of his current PT POC with  plan to d/c after next week. Next session to focus on adding to pt's HEP for him to continue with once d/c from PT services. Will continue discussion with pt and family next week prior to d/c. Continue POC.   OBJECTIVE IMPAIRMENTS Abnormal gait, decreased activity tolerance, decreased balance, decreased coordination, decreased endurance, decreased knowledge of use of DME, decreased mobility, difficulty walking, decreased ROM, decreased strength, decreased safety awareness, impaired flexibility, impaired tone, impaired UE functional use, and postural dysfunction.   ACTIVITY LIMITATIONS carrying, standing, stairs, transfers, bathing, and locomotion level  PARTICIPATION LIMITATIONS: meal prep, cleaning, driving, community activity, and bowling  PERSONAL FACTORS Behavior pattern, Past/current experiences, Time since onset of injury/illness/exacerbation, and 3+ comorbidities: suprasellar germinoma status post chemo and radiation therapy at age 41 with LUE/LLE hemiparesis (patient has been cared for by his mother since this time and has had persistent gait difficulties and limitation of left arm ROM), HTN, HLD, anxiety, ADD, chronic ataxia.   are also affecting patient's functional outcome.   REHAB POTENTIAL: Fair due to chronicity of condition, pt has not received PT before.  CLINICAL DECISION MAKING: Evolving/moderate complexity  EVALUATION COMPLEXITY: Moderate  PLAN: PT FREQUENCY: 2x/week  PT DURATION: 8 weeks  PLANNED INTERVENTIONS: Therapeutic exercises, Therapeutic activity, Neuromuscular re-education, Balance training, Gait training, Patient/Family education, Self Care, Stair training, Vestibular training, Orthotic/Fit training, DME instructions, and Manual therapy  PLAN FOR NEXT SESSION:  Add to HEP as needed, strengthening, ROM esp L hamstring and ankle (gave stretches 8/3); gait mechanics- anything for carryover of longer and equal step length with L heel contact, treadmill ball kicks, L  heel strike priming, add to HEP next session (standing on wedge, backwards gait, step-downs, dot taps)   Excell Seltzer, PT, DPT, CSRS 07/26/22, 3:51 PM

## 2022-07-31 ENCOUNTER — Ambulatory Visit: Payer: Medicare Other | Admitting: Physical Therapy

## 2022-07-31 DIAGNOSIS — Z9181 History of falling: Secondary | ICD-10-CM | POA: Diagnosis not present

## 2022-07-31 DIAGNOSIS — R2681 Unsteadiness on feet: Secondary | ICD-10-CM | POA: Diagnosis not present

## 2022-07-31 DIAGNOSIS — R2689 Other abnormalities of gait and mobility: Secondary | ICD-10-CM | POA: Diagnosis not present

## 2022-07-31 DIAGNOSIS — M6281 Muscle weakness (generalized): Secondary | ICD-10-CM | POA: Diagnosis not present

## 2022-07-31 NOTE — Therapy (Signed)
OUTPATIENT PHYSICAL THERAPY NEURO TREATMENT   Patient Name: Nicholas Holt MRN: [id number] DOB:[birth year], 41 y.o., male Today's Date: 07/31/2022   PCP: Aldine Contes, MD REFERRING PROVIDER: Axel Filler, MD     PT End of Session - 07/31/22 1101     Visit Number 14    Number of Visits 17    Date for PT Re-Evaluation 08/05/22    Authorization Type Medicare - Part A & B    Progress Note Due on Visit 20    PT Start Time 1100    PT Stop Time 1144    PT Time Calculation (min) 44 min    Equipment Utilized During Treatment Gait belt;Other (comment)   L custom AFO   Activity Tolerance Patient tolerated treatment well    Behavior During Therapy [facility] for tasks assessed/performed;Impulsive                       Past Medical History:  Diagnosis Date   AKI (acute kidney injury) ([place])    AKI (acute kidney injury) ([place]) 01/11/2021   Anxiety    Attention deficit disorder 08/08/2006   Bilateral leg cramps 11/25/2006   Blood transfusion without reported diagnosis 1997   Chemotherapy associated   Cough 12/08/2015   Elevated hemidiaphragm 07/09/2015   Left, presumably after portacath placement.    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  Complicated by left sided motor tic disorder and panhypopituitarism.    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  Includes central diabetes insipidus, secondary hypothyroidism, secondary adrenal insufficiency, and secondary hypogonadism.    Perennial allergic rhinitis 07/09/2015    Pre-diabetes    no meds   RENAL INSUFFICIENCY    Seizure disorder ([place]) 08/08/2006   Predated brain tumor.  Generalized seizure ocurred after running out of lorazepam post-tumor.    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 Treated with alendronate from 2009-2016. DEXA (03/16/2008): L Spine T -2.0. L fem neck T -0.9, R fem neck T -1.9.  DEXA (10/15/2014): L Spine T -1.3, L fem neck T -0.6, R fem neck T -1.8.  Bisphosphonate holiday 06/2015.  Repeat DEXA scan in 10/2016 to reassess.    Past Surgical History:  Procedure Laterality Date   BRAIN BIOPSY  11/14/1995   CSF SHUNT  11/14/1995   PORTACATH PLACEMENT     portacath removal     RADIOLOGY WITH ANESTHESIA N/A 08/30/2021   Procedure: MRI WITH ANESTHESIA BRAIN WITH AND WITHOUT CONTRAST;  Surgeon: Radiologist, Medication, MD;  Location: [place];  Service: Radiology;  Laterality: N/A;   RADIOLOGY WITH ANESTHESIA N/A 05/25/2022   Procedure: MRI WITH ANESTHESIA OF BRAIN WITH AND WITHOUT CONTRAST;  Surgeon: Radiologist, Medication, MD;  Location: [place];  Service: Radiology;  Laterality: N/A;   Patient Active Problem List   Diagnosis Date Noted   OSA (obstructive sleep apnea) 07/06/2022   Hyponatremia 05/30/2022   Multiple lacunar infarcts ([place]) 05/04/2022   Fall 04/13/2022   Hypertension 12/09/2019   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   Elevated hemidiaphragm 07/09/2015   Overweight 07/09/2015  Perennial allergic rhinitis 07/09/2015   Preventative health care 07/09/2015   H/O malignant neoplasm of brain 09/22/2014   Steroid-induced osteoporosis 03/27/2008   Bilateral leg cramps 11/25/2006   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 male with history of suprasellar germinoma at age 40 with LUE/LLE hemiparesis (patient has been cared for by his mother since this time and has had persistent gait difficulties and limitation of left arm ROM), HTN, HLD, anxiety, ADD, chronic ataxia. Patient walks on his toes at baseline and has been more unsteady than usual over the past few weeks.  Pt was directly admitted  on 05/29/22 for work-up of acutely worsening balance and ambulation with recent MRI showeing evidence of prior hemorrhages and lacunar infarcts in bilateral basal ganglia and cerebellum   PRECAUTIONS: Fall   PATIENT GOALS: Be able to bowl again every Saturday morning    SUBJECTIVE: Pt reports no changes since last session. Pt reports no pain, HEP is going well.                                                                                                                                                                                            Pt accompanied by: self, parents (in the lobby)  PAIN:  Are you having pain? No    TODAY'S TREATMENT:  THER ACT: Backwards gait in clinic with CGA for balance, initially with narrow BOS and scooting of LLE, able to widen BOS with cues but continues to scoot LLE along the floor  Standing pole step-overs with L heel strike on target, 2 x 15 reps Gait following this task with focus on maintaining increase LLE step length and heel strike with gait, carryover to about 50% of steps with mod v/c needed  Eccentric 4" step-downs in // bars with focus on heel strike on target, 2 x 10 reps  Static stance with one UE support performing LLE multi-directional small target taps, 2 x 10 reps  Added to HEP, see bolded  below  GAIT: Gait pattern:  toe-walking, step to pattern, decreased arm swing- Left, decreased step length- Left, decreased hip/knee flexion- Left, decreased ankle dorsiflexion- Left, and poor foot clearance- Left Distance walked: 115  ft Assistive device utilized: None Level of assistance: CGA Comments: trial gait without L AFO  Gait pattern:  toe-walking, decreased arm swing- Left, decreased hip/knee flexion- Left, decreased ankle dorsiflexion- Left, and poor foot clearance- Left Distance walked: 115 ft Assistive device utilized: None Level of assistance: SBA Comments: with L AFP; decreased toe-walking as compared to without L AFO, continues to need cues to attend to L heel strike and increased step length during gait    PATIENT EDUCATION: Education details: continue HEP, continue to work on heel strike with gait with cues for "heel" and "big step", added to HEP and again discussed d/c next visit Person educated: Patient and Parent (parents in lobby) Education method: Explanation, Demonstration, Corporate treasurer cues, Verbal cues, and Handouts Education comprehension: verbalized understanding   HOME EXERCISE PROGRAM: Access Code: [facility] URL: [email or web address] Date: 07/31/2022 Prepared by: Excell Seltzer  Exercises - Seated Hamstring Stretch with Strap  - 1 x daily - 7 x weekly - 2 sets - 5 reps - 30 hold - Gastroc Stretch on Wall  - 1 x daily - 7 x weekly - 2 sets - 5 reps - 30 hold - Standing Bilateral Gastroc Stretch with Step  - 1 x daily - 7 x weekly - 2 sets - 5 reps - 30 hold - Step-Over  - 1 x daily - 7 x weekly - 3 sets - 10 reps - Alternating Step Taps with Counter Support  - 1 x daily - 7 x weekly - 3 sets - 10 reps - Industrial/product designer with Single Rail Using Step-To Pattern (One Step at a Time)  - 1 x daily - 7 x weekly - 1 sets - 2 reps - Forward Step Up  - 1 x daily - 7 x weekly - 3 sets - 10 reps - Forward Step Down with Heel Tap and Counter Support  - 1  x daily - 7 x weekly - 3 sets - 10 reps - Sideways Step Touch  - 1 x daily - 7 x weekly - 3 sets - 10 reps    GOALS: Goals reviewed with patient? Yes  SHORT TERM GOALS: Target date: 07/04/2022  Pt will be independent with initial HEP with family supervision in order to build upon functional gains made in therapy. Baseline: Goal status: MET  2.  Pt will finish assessment of BERG with LTG written. Baseline: 31/56 on 8/7 Goal status: MET  3.  Pt will improve 5x sit<>stand to less than or equal to 19 sec with BUE support to demonstrate improved functional strength and transfer efficiency.  Baseline: 23.66 seconds with BUE support, 20.28 sec (8/23) Goal status: IN PROGRESS  4.  Pt will improve gait speed with no AD vs. LRAD to at least 2.1 ft/sec in order to demo improved community mobility.  Baseline: 1.88 ft/sec with no AD, 2.42 ft/sec (8/23)  Goal status: MET  5.  Pt will improve TUG time to 22 seconds or less with no AD vs. LRAD in order to demo decrease fall risk. Baseline: 26.6 seconds no AD with min A, 16.97 sec no AD Supervision (8/23) Goal status: MET   LONG TERM GOALS: Target date: 08/01/2022  Pt will be independent with final HEP with family supervision in order to build upon functional gains made in therapy. Baseline:  Goal status: INITIAL  2.  Pt will improve Berg score to 40/56 for decreased fall risk Baseline: 31/56 on 8/7 Goal status: INITIAL  3.  Pt will improve TUG time to  15 seconds or less with no AD vs. LRAD in order to demo decrease fall risk. Baseline: 26.6 seconds no AD with min A, 16.97 sec no AD S* (8/23) Goal status: REVISED  4.  Pt will go up and down 4 steps with step to pattern and supervision with no handrail in order to safely enter/exit home. Baseline: Stairs not assessed - pt has 3 steps to get in and out without handrails Goal status: INITIAL  5.  Pt will improve gait speed with no AD vs. LRAD to at least 2.8 ft/sec in order to demo  improved community mobility.  Baseline: 1.88 ft/sec with no AD, 2.42 ft/sec with no AD (8/23)  Goal status: REVISED  ASSESSMENT:  CLINICAL IMPRESSION: Emphasis of skilled PT session on adding to pt's HEP with planned d/c from PT services after next session. Added exercises to pt's HEP to continue to work on LLE priming for heel strike and increased step length with gait. Pt continues to require verbal cueing to attend to LLE with gait but does show improved ability to maintain improved gait mechanics following heel cord and hamstring stretches as well as priming activities. Handout provided and exercises reviewed with patient. Continue POC.   OBJECTIVE IMPAIRMENTS Abnormal gait, decreased activity tolerance, decreased balance, decreased coordination, decreased endurance, decreased knowledge of use of DME, decreased mobility, difficulty walking, decreased ROM, decreased strength, decreased safety awareness, impaired flexibility, impaired tone, impaired UE functional use, and postural dysfunction.   ACTIVITY LIMITATIONS carrying, standing, stairs, transfers, bathing, and locomotion level  PARTICIPATION LIMITATIONS: meal prep, cleaning, driving, community activity, and bowling  PERSONAL FACTORS Behavior pattern, Past/current experiences, Time since onset of injury/illness/exacerbation, and 3+ comorbidities: suprasellar germinoma status post chemo and radiation therapy at age 26 with LUE/LLE hemiparesis (patient has been cared for by his mother since this time and has had persistent gait difficulties and limitation of left arm ROM), HTN, HLD, anxiety, ADD, chronic ataxia.   are also affecting patient's functional outcome.   REHAB POTENTIAL: Fair due to chronicity of condition, pt has not received PT before.  CLINICAL DECISION MAKING: Evolving/moderate complexity  EVALUATION COMPLEXITY: Moderate  PLAN: PT FREQUENCY: 2x/week  PT DURATION: 8 weeks  PLANNED INTERVENTIONS: Therapeutic exercises,  Therapeutic activity, Neuromuscular re-education, Balance training, Gait training, Patient/Family education, Self Care, Stair training, Vestibular training, Orthotic/Fit training, DME instructions, and Manual therapy  PLAN FOR NEXT SESSION:  Assess LTG and d/c from PT   Excell Seltzer, PT, DPT, CSRS 07/31/22, 11:44 AM

## 2022-08-01 DIAGNOSIS — Z23 Encounter for immunization: Secondary | ICD-10-CM | POA: Diagnosis not present

## 2022-08-02 ENCOUNTER — Ambulatory Visit: Payer: Medicare Other | Admitting: Physical Therapy

## 2022-08-02 DIAGNOSIS — R2681 Unsteadiness on feet: Secondary | ICD-10-CM | POA: Diagnosis not present

## 2022-08-02 DIAGNOSIS — R2689 Other abnormalities of gait and mobility: Secondary | ICD-10-CM | POA: Diagnosis not present

## 2022-08-02 DIAGNOSIS — Z9181 History of falling: Secondary | ICD-10-CM | POA: Diagnosis not present

## 2022-08-02 DIAGNOSIS — M6281 Muscle weakness (generalized): Secondary | ICD-10-CM | POA: Diagnosis not present

## 2022-08-02 NOTE — Therapy (Signed)
OUTPATIENT PHYSICAL THERAPY NEURO TREATMENT - DISCHARGE   Patient Name: Nicholas Holt MRN: [id number] DOB:1981-06-19, 41 y.o., male Today's Date: 08/02/2022   PCP: Nicholas Contes, MD REFERRING PROVIDER: Axel Filler, MD  PHYSICAL THERAPY DISCHARGE SUMMARY  Visits from Start of Care: 15  Current functional level related to goals / functional outcomes: Mod I overall for gait, min A for stairs with no handrails, mod I for stairs with handrails   Remaining deficits: Ongoing L toe-walking with decreased attention to L hemibody during gait   Education / Equipment: No equipment Handout provided for HEP   Patient agrees to discharge. Patient goals were partially met. Patient is being discharged due to maximized rehab potential.        PT End of Session - 08/02/22 1052     Visit Number 15    Number of Visits 17    Date for PT Re-Evaluation 08/05/22    Authorization Type Medicare - Part A & B    Progress Note Due on Visit 20    PT Start Time 1050    PT Stop Time 1128    PT Time Calculation (min) 38 min    Equipment Utilized During Treatment Gait belt;Other (comment)   L custom AFO   Activity Tolerance Patient tolerated treatment well    Behavior During Therapy [facility] for tasks assessed/performed;Impulsive                        Past Medical History:  Diagnosis Date   AKI (acute kidney injury) ([place])    AKI (acute kidney injury) ([place]) 01/11/2021   Anxiety    Attention deficit disorder 08/08/2006   Bilateral leg cramps 11/25/2006   Blood transfusion without reported diagnosis 1997   Chemotherapy associated   Cough 12/08/2015   Elevated hemidiaphragm 07/09/2015   Left, presumably after portacath placement.    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  Complicated by left sided motor tic disorder  and panhypopituitarism.    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  Includes central diabetes insipidus, secondary hypothyroidism, secondary adrenal insufficiency, and secondary hypogonadism.    Perennial allergic rhinitis 07/09/2015   Pre-diabetes    no meds   RENAL INSUFFICIENCY    Seizure disorder ([place]) 08/08/2006   Predated brain tumor.  Generalized seizure ocurred after running out of lorazepam post-tumor.    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 Treated with alendronate from 2009-2016. DEXA (03/16/2008): L Spine T -2.0. L fem neck T -0.9, R fem neck T -1.9.  DEXA (10/15/2014): L Spine T -1.3, L fem neck T -0.6, R fem neck T -1.8.  Bisphosphonate holiday 06/2015.  Repeat DEXA scan in 10/2016 to reassess.    Past Surgical History:  Procedure Laterality Date   BRAIN BIOPSY  11/14/1995   CSF SHUNT  11/14/1995   PORTACATH PLACEMENT     portacath removal     RADIOLOGY WITH ANESTHESIA N/A 08/30/2021   Procedure: MRI WITH ANESTHESIA BRAIN WITH AND WITHOUT CONTRAST;  Surgeon: Radiologist, Medication, MD;  Location: [place];  Service:  Radiology;  Laterality: N/A;   RADIOLOGY WITH ANESTHESIA N/A 05/25/2022   Procedure: MRI WITH ANESTHESIA OF BRAIN WITH AND WITHOUT CONTRAST;  Surgeon: Radiologist, Medication, MD;  Location: [place];  Service: Radiology;  Laterality: N/A;   Patient Active Problem List   Diagnosis Date Noted   OSA (obstructive sleep apnea) 07/06/2022   Hyponatremia 05/30/2022   Multiple lacunar infarcts ([place]) 05/04/2022   Fall 04/13/2022   Hypertension 12/09/2019   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   Elevated hemidiaphragm  07/09/2015   Overweight 07/09/2015   Perennial allergic rhinitis 07/09/2015   Preventative health care 07/09/2015   H/O malignant neoplasm of brain 09/22/2014   Steroid-induced osteoporosis 03/27/2008   Bilateral leg cramps 11/25/2006   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 male with history of suprasellar germinoma at age 2 with LUE/LLE hemiparesis (patient has been cared for by his mother since this time and has had persistent gait difficulties and limitation of left arm ROM), HTN, HLD, anxiety, ADD, chronic ataxia. Patient walks on his toes at baseline and has been more unsteady than usual over the past few weeks.  Pt was directly admitted  on 05/29/22 for work-up of acutely worsening balance and ambulation with recent MRI showeing evidence of prior hemorrhages and lacunar infarcts in bilateral basal ganglia and cerebellum   PRECAUTIONS: Fall   PATIENT GOALS: Be able to bowl again every Saturday morning    SUBJECTIVE: Pt reports no falls/near falls since last visit. No questions over HEP.                                                                                                                                                                                            Pt accompanied by: self, parents (in the lobby)  PAIN:  Are you having pain? No    TODAY'S TREATMENT:  THER ACT:   [facility] PT Assessment - 08/02/22 1055       Ambulation/Gait   Gait velocity  32.8 ft over 11.4 sec = 2.88 ft/sec      Standardized Balance Assessment   Standardized Balance Assessment Timed Up and Go Test;Berg Balance Test      [facility] Balance Test  Sit to Stand Able to stand  independently using hands    Standing Unsupported Able to stand safely 2 minutes    Sitting with Back Unsupported but Feet Supported on Floor or Stool Able to sit safely and securely 2 minutes    Stand to Sit Sits independently, has uncontrolled descent    Transfers Able to transfer safely, definite need of hands    Standing Unsupported with Eyes Closed Able to stand 10 seconds safely    Standing Unsupported with Feet Together Able to place feet together independently and stand 1 minute safely    From Standing, Reach Forward with Outstretched Arm Reaches forward but needs supervision    From Standing Position, Pick up Object from Floor Able to pick up shoe, needs supervision    From Standing Position, Turn to Look Behind Over each Shoulder Turn sideways only but maintains balance    Turn 360 Degrees Able to turn 360 degrees safely but slowly    Standing Unsupported, Alternately Place Feet on Step/Stool Able to complete 4 steps without aid or supervision    Standing Unsupported, One Foot in Front Able to take small step independently and hold 30 seconds    Standing on One Leg Able to lift leg independently and hold equal to or more than 3 seconds    Total Score 37    Berg comment: 37/56, significant fall risk      Timed Up and Go Test   TUG Normal TUG    Normal TUG (seconds) 10.8            4 square-stepping over yard-sticks: -CGA for balance, increased anxiety/fear of fall with backwards stepping but overall good foot clearance noted  GAIT:  STAIRS:  Level of Assistance: Min A  Stair Negotiation Technique: Step to Pattern with No Rails  Number of Stairs: 4   Height of Stairs: 6  Comments: to simulate STE at pt's home, some apprehension without use of handrail. Pt reports his  parents assist him on the stairs at home.  GAIT: Gait pattern:  toe-walking with LLE, decreased arm swing- Left, decreased step length- Left, decreased stride length, knee flexed in stance- Left, and poor foot clearance- Left Distance walked: various clinic distances Assistive device utilized: None Level of assistance: Modified independence Comments: gait indoors, ongoing toe-walking with LLE even with cues for L heel strike and increased step length  Gait pattern:  toe-walking with LLE, decreased arm swing- Left, decreased step length- Left, decreased stride length, knee flexed in stance- Left, and poor foot clearance- Left Distance walked: 500 ft Assistive device utilized: None Level of assistance: CGA Comments: increase in L toe catching outdoors and across uneven ground, noted increase in deviations with increase in gait speed  PATIENT EDUCATION: Education details: continue HEP, continue to work on heel strike with gait with cues for "heel" and "big step", d/c from PT Person educated: Patient and Parent (parents in lobby) Education method: Explanation, Demonstration, Corporate treasurer cues, and Verbal cues Education comprehension: verbalized understanding   HOME EXERCISE PROGRAM: Access Code: [facility] URL: [email or web address] Date: 07/31/2022 Prepared by: Excell Seltzer  Exercises - Seated Hamstring Stretch with Strap  - 1 x daily - 7 x weekly - 2 sets - 5 reps - 30 hold - Gastroc Stretch on Wall  - 1 x daily - 7 x weekly - 2 sets - 5 reps - 30 hold - Standing Bilateral Gastroc Stretch with Step  - 1 x daily - 7 x weekly - 2 sets -  5 reps - 30 hold - Step-Over  - 1 x daily - 7 x weekly - 3 sets - 10 reps - Alternating Step Taps with Counter Support  - 1 x daily - 7 x weekly - 3 sets - 10 reps - Industrial/product designer with Single Rail Using Step-To Pattern (One Step at a Time)  - 1 x daily - 7 x weekly - 1 sets - 2 reps - Forward Step Up  - 1 x daily - 7 x weekly - 3 sets - 10  reps - Forward Step Down with Heel Tap and Counter Support  - 1 x daily - 7 x weekly - 3 sets - 10 reps - Sideways Step Touch  - 1 x daily - 7 x weekly - 3 sets - 10 reps    GOALS: Goals reviewed with patient? Yes  SHORT TERM GOALS: Target date: 07/04/2022  Pt will be independent with initial HEP with family supervision in order to build upon functional gains made in therapy. Baseline: Goal status: MET  2.  Pt will finish assessment of BERG with LTG written. Baseline: 31/56 on 8/7 Goal status: MET  3.  Pt will improve 5x sit<>stand to less than or equal to 19 sec with BUE support to demonstrate improved functional strength and transfer efficiency.  Baseline: 23.66 seconds with BUE support, 20.28 sec (8/23) Goal status: IN PROGRESS  4.  Pt will improve gait speed with no AD vs. LRAD to at least 2.1 ft/sec in order to demo improved community mobility.  Baseline: 1.88 ft/sec with no AD, 2.42 ft/sec (8/23)  Goal status: MET  5.  Pt will improve TUG time to 22 seconds or less with no AD vs. LRAD in order to demo decrease fall risk. Baseline: 26.6 seconds no AD with min A, 16.97 sec no AD Supervision (8/23) Goal status: MET   LONG TERM GOALS: Target date: 08/01/2022  Pt will be independent with final HEP with family supervision in order to build upon functional gains made in therapy. Baseline:  Goal status: MET  2.  Pt will improve Berg score to 40/56 for decreased fall risk Baseline: 31/56 on 8/7, 37/56 on 9/20 Goal status: NOT MET  3.  Pt will improve TUG time to 15 seconds or less with no AD vs. LRAD in order to demo decrease fall risk. Baseline: 26.6 seconds no AD with min A, 16.97 sec no AD S* (8/23), 10.8 sec no AD mod I (9/20) Goal status: MET  4.  Pt will go up and down 4 steps with step to pattern and supervision with no handrail in order to safely enter/exit home. Baseline: 4 stairs with min A needed due to apprehension without handrail Goal status: NOT MET  5.  Pt  will improve gait speed with no AD vs. LRAD to at least 2.8 ft/sec in order to demo improved community mobility.  Baseline: 1.88 ft/sec with no AD, 2.42 ft/sec with no AD (8/23), 2.88 ft/sec no AD (9/20)  Goal status: MET  ASSESSMENT:  CLINICAL IMPRESSION: Emphasis of skilled PT session on reassessing LTG in preparation from d/c from PT services this date. Pt has met 3/5 LTG due to being independent with his final HEP, improving his TUG score from 26.6 sec initially to 10.8 sec this date, improving his gait speed from 1.88 ft/sec initially to 2.88 ft/sec this date. Pt improved his Berg score from 31/56 to 37/56, demonstrating a decreased fall risk and improved balance although he did not meet  the LTG of 40/56. Additionally, he was able to navigate up/down 4 stairs with no handrails with min A but did not meet Supervision level for this goal due to some apprehension with stair navigation without use of rails. Pt reports that his parents provide assist when he navigates his STE at home without rails. Pt has met his goals adequately for a safe d/c from PT services at this time with plan to continue with his HEP to address ongoing gait and balance impairments. Pt does continue to exhibit toe-walking on LLE during gait even with use of AFO and with cues for L heel strike and to increase step length due to decreased attention to limb during gait.    OBJECTIVE IMPAIRMENTS Abnormal gait, decreased activity tolerance, decreased balance, decreased coordination, decreased endurance, decreased knowledge of use of DME, decreased mobility, difficulty walking, decreased ROM, decreased strength, decreased safety awareness, impaired flexibility, impaired tone, impaired UE functional use, and postural dysfunction.   ACTIVITY LIMITATIONS carrying, standing, stairs, transfers, bathing, and locomotion level  PARTICIPATION LIMITATIONS: meal prep, cleaning, driving, community activity, and bowling  PERSONAL FACTORS  Behavior pattern, Past/current experiences, Time since onset of injury/illness/exacerbation, and 3+ comorbidities: suprasellar germinoma status post chemo and radiation therapy at age 18 with LUE/LLE hemiparesis (patient has been cared for by his mother since this time and has had persistent gait difficulties and limitation of left arm ROM), HTN, HLD, anxiety, ADD, chronic ataxia.   are also affecting patient's functional outcome.   REHAB POTENTIAL: Fair due to chronicity of condition, pt has not received PT before.  CLINICAL DECISION MAKING: Evolving/moderate complexity  EVALUATION COMPLEXITY: Moderate     Excell Seltzer, PT, DPT, CSRS 08/02/22, 11:32 AM

## 2022-08-12 ENCOUNTER — Other Ambulatory Visit: Payer: Self-pay | Admitting: Internal Medicine

## 2022-08-12 DIAGNOSIS — R252 Cramp and spasm: Secondary | ICD-10-CM

## 2022-08-16 ENCOUNTER — Other Ambulatory Visit: Payer: Self-pay | Admitting: Internal Medicine

## 2022-08-16 DIAGNOSIS — F4322 Adjustment disorder with anxiety: Secondary | ICD-10-CM

## 2022-08-16 DIAGNOSIS — G40909 Epilepsy, unspecified, not intractable, without status epilepticus: Secondary | ICD-10-CM

## 2022-08-25 ENCOUNTER — Telehealth: Payer: Self-pay

## 2022-08-25 DIAGNOSIS — E785 Hyperlipidemia, unspecified: Secondary | ICD-10-CM

## 2022-08-25 MED ORDER — ATORVASTATIN CALCIUM 40 MG PO TABS
40.0000 mg | ORAL_TABLET | Freq: Every day | ORAL | 3 refills | Status: DC
Start: 2022-08-25 — End: 2022-10-12

## 2022-08-25 NOTE — Telephone Encounter (Signed)
Atorvastatin [facility], [place] AT [place]

## 2022-09-12 DIAGNOSIS — Z125 Encounter for screening for malignant neoplasm of prostate: Secondary | ICD-10-CM | POA: Diagnosis not present

## 2022-09-12 DIAGNOSIS — E785 Hyperlipidemia, unspecified: Secondary | ICD-10-CM | POA: Diagnosis not present

## 2022-09-12 DIAGNOSIS — E669 Obesity, unspecified: Secondary | ICD-10-CM | POA: Diagnosis not present

## 2022-09-12 DIAGNOSIS — E232 Diabetes insipidus: Secondary | ICD-10-CM | POA: Diagnosis not present

## 2022-09-12 DIAGNOSIS — E230 Hypopituitarism: Secondary | ICD-10-CM | POA: Diagnosis not present

## 2022-09-12 DIAGNOSIS — R7303 Prediabetes: Secondary | ICD-10-CM | POA: Diagnosis not present

## 2022-09-26 ENCOUNTER — Other Ambulatory Visit: Payer: Self-pay | Admitting: Internal Medicine

## 2022-09-26 DIAGNOSIS — G40909 Epilepsy, unspecified, not intractable, without status epilepticus: Secondary | ICD-10-CM

## 2022-09-26 DIAGNOSIS — F4322 Adjustment disorder with anxiety: Secondary | ICD-10-CM

## 2022-10-10 ENCOUNTER — Ambulatory Visit (INDEPENDENT_AMBULATORY_CARE_PROVIDER_SITE_OTHER): Payer: Medicare Other | Admitting: Internal Medicine

## 2022-10-10 VITALS — BP 126/89 | HR 68 | Temp 97.7°F | Ht 68.0 in | Wt 201.1 lb

## 2022-10-10 DIAGNOSIS — I10 Essential (primary) hypertension: Secondary | ICD-10-CM | POA: Diagnosis not present

## 2022-10-10 DIAGNOSIS — E230 Hypopituitarism: Secondary | ICD-10-CM | POA: Diagnosis not present

## 2022-10-10 DIAGNOSIS — G4733 Obstructive sleep apnea (adult) (pediatric): Secondary | ICD-10-CM | POA: Diagnosis not present

## 2022-10-10 DIAGNOSIS — I6381 Other cerebral infarction due to occlusion or stenosis of small artery: Secondary | ICD-10-CM

## 2022-10-10 DIAGNOSIS — E785 Hyperlipidemia, unspecified: Secondary | ICD-10-CM

## 2022-10-10 DIAGNOSIS — M818 Other osteoporosis without current pathological fracture: Secondary | ICD-10-CM | POA: Diagnosis not present

## 2022-10-10 DIAGNOSIS — G40909 Epilepsy, unspecified, not intractable, without status epilepticus: Secondary | ICD-10-CM

## 2022-10-10 DIAGNOSIS — R748 Abnormal levels of other serum enzymes: Secondary | ICD-10-CM | POA: Diagnosis not present

## 2022-10-10 DIAGNOSIS — E871 Hypo-osmolality and hyponatremia: Secondary | ICD-10-CM

## 2022-10-10 DIAGNOSIS — T380X5A Adverse effect of glucocorticoids and synthetic analogues, initial encounter: Secondary | ICD-10-CM | POA: Diagnosis not present

## 2022-10-10 DIAGNOSIS — W19XXXA Unspecified fall, initial encounter: Secondary | ICD-10-CM

## 2022-10-10 DIAGNOSIS — R7303 Prediabetes: Secondary | ICD-10-CM | POA: Diagnosis not present

## 2022-10-10 DIAGNOSIS — Z0000 Encounter for general adult medical examination without abnormal findings: Secondary | ICD-10-CM

## 2022-10-10 NOTE — Assessment & Plan Note (Signed)
-  This problem is chronic and stable -Patient is on alendronate 70 mg weekly for this -We will recheck his DEXA scan and assess his Z scores -If they have improved we will consider giving him a drug holiday from alendronate -No further work-up at this time

## 2022-10-10 NOTE — Progress Notes (Signed)
   Subjective:    Patient ID: Nicholas Holt, male    DOB: 15-Jun-1981, 41 y.o.   MRN: [id number]  HPI  I have seen and examined this patient.  Patient is here for routine follow-up of his hypertension and hyperlipidemia.  Patient states that he feels well today and denies any new complaints.  He does not have any recent falls and has been following with podiatry for his foot drop.  He is compliant with all his medications.  He is accompanied by his mother for this appointment.  Review of Systems  Constitutional: Negative.   HENT: Negative.    Respiratory: Negative.    Cardiovascular: Negative.   Gastrointestinal: Negative.   Genitourinary: Negative.   Musculoskeletal: Negative.   Neurological: Negative.   Psychiatric/Behavioral: Negative.         Objective:   Physical Exam Constitutional:      Appearance: Normal appearance.  HENT:     Head: Normocephalic and atraumatic.  Cardiovascular:     Rate and Rhythm: Normal rate and regular rhythm.     Heart sounds: Normal heart sounds. No murmur heard. Pulmonary:     Effort: Pulmonary effort is normal. No respiratory distress.     Breath sounds: Normal breath sounds. No wheezing or rales.  Abdominal:     General: Bowel sounds are normal. There is no distension.     Palpations: Abdomen is soft.     Tenderness: There is no abdominal tenderness.  Musculoskeletal:        General: No swelling or tenderness.     Cervical back: Neck supple. No rigidity.     Comments: Left foot brace in place  Neurological:     Mental Status: He is alert and oriented to person, place, and time.  Psychiatric:        Mood and Affect: Mood normal.        Behavior: Behavior normal.           Assessment & Plan:  Please see problem based charting for assessment and plan:

## 2022-10-10 NOTE — Assessment & Plan Note (Signed)
-  This problem is chronic and stable -Patient was noted to be prediabetic in July when he was admitted for lacunar infarcts with an A1c of 5.8 -He is working on his diet and exercise -We will recheck his A1c today

## 2022-10-10 NOTE — Assessment & Plan Note (Signed)
-  This problem is chronic and stable -Patient follows up with Dr. Buddy Duty from endocrinology for this -He is compliant with desmopressin 10 mcg 4 times daily, testosterone gel daily as well as hydrocortisone 10 mg in the morning -He did have episodes of hyponatremia in the past likely secondary to increased water intake and use of DDAVP.  His last sodium with Dr. Buddy Duty in August was 141 -We will recheck a BMP today to follow-up on his sodium levels -No further work-up at this time

## 2022-10-10 NOTE — Assessment & Plan Note (Signed)
-  This problem is chronic and improving -Patient has had no further falls since working with PT and getting a left ankle brace -No further work-up at this time

## 2022-10-10 NOTE — Assessment & Plan Note (Signed)
-  This problem is chronic and stable -Patient started on high intensity statin for his lacunar infarcts in July -We will recheck a lipid panel at this visit -We will also recheck his CK since he has elevated CK levels at baseline to make sure that the statin has not worsened this -No further work-up at this time

## 2022-10-10 NOTE — Patient Instructions (Signed)
-  It was a pleasure seeing you again today.  I hope you have a great holiday season! -We will check some blood work on you today including your kidney function and sodium as well as your muscle enzyme level (CK) -I have put you in for a bone scan to see if they are stronger now on the medication -Please call me if you have any questions or concerns or if you need any refills -Blood pressure was a little high today.  We will recheck this but your past blood pressures have all been well controlled on her current regimen and I do not want to change her medications at this time. -Please follow-up in 3 months

## 2022-10-10 NOTE — Assessment & Plan Note (Signed)
-  Patient has been noted to have elevated CK levels -Patient did have a work-up for this back in 2011 with Dr. Marinda Elk and [facility] neurology. At that time an EMG/nerve conduction study was done and a decision was made that a muscle biopsy was not needed at that time. Patient's CK at his endocrinology office was elevated to 501. Patient had multiple CK levels between 2009 and 2011 ranging from 500s to 900s. His current CK level remains elevated in the 400s -I do not think that he needs a muscle biopsy at this time given his prior negative work-up and lack of myalgias or worsening weakness.  However, if patient CKs worsen or patient becomes symptomatic would refer for possible muscle biopsy and further work-up -We will continue to monitor closely.  We will repeat CK levels today.  Addendum: -Patient does have elevated CK levels to the 600s.  His CK has been elevated for years and etiology behind this remains unclear.  He is on a statin which could potentially worsen his CK levels but he does have a history of lacunar infarcts and does benefit from being on statin.  His CK levels have been higher in the past and I do not believe that the statin is the etiology behind his elevated CK.  We will continue his statin for now. -We will refer patient to rheumatology for further work-up of his elevated CK levels.

## 2022-10-10 NOTE — Assessment & Plan Note (Signed)
-  This problem is chronic and stable -Patient not has a seizure episode in years and is stable on his current regimen of lorazepam 6 mg at night which we prescribed to him monthly -The last time the patient ran out of lorazepam he did have a generalized seizure per prior PCP -Patient's seizure disorder did predate his brain tumor -No further work-up at this time.  We will continue with lorazepam at current dose

## 2022-10-10 NOTE — Assessment & Plan Note (Signed)
-  At her last visit, patient's mother stated that he had OSA in the past and could not tolerate his facemask because of pain of claustrophobic.  At that time, I referred him for a sleep study to further evaluate this. -Patient mother states that his sleep is much better now and she does not think that he has this currently.  She canceled a sleep study and does not feel that he needs further work-up at this time -Patient may benefit from referral to Dr. Rexene Alberts (neuro/sleep) in the future if he wishes to work this up further

## 2022-10-10 NOTE — Assessment & Plan Note (Signed)
BP Readings from Last 3 Encounters:  10/10/22 126/89  07/06/22 120/73  06/15/22 137/77    Lab Results  Component Value Date   NA 122 (L) 06/08/2022   K 3.3 (L) 06/08/2022   CREATININE 0.79 06/08/2022    Assessment: Blood pressure control:  Well-controlled Progress toward BP goal:   At goal Comments: Patient is compliant with amlodipine 5 mg daily as well as losartan 25 mg daily  Plan: Medications:  continue current medications Educational resources provided:   Self management tools provided:   Other plans: We will check BMP today.

## 2022-10-10 NOTE — Assessment & Plan Note (Signed)
-  This problem is chronic and stable -Patient follows up with neurology (Dr. Leta Baptist) for this -He will continue on aspirin and statin for now -He has improved with PT and has not had any further falls. -He does have a left foot drop and has been following podiatry for this.  They gave him a brace which is significantly improved his ambulation. -No further work-up at this time

## 2022-10-10 NOTE — Assessment & Plan Note (Signed)
-  This problem is improved -His hyponatremia was likely secondary to increase fluid intake as well as lower dose of hydrocortisone and being on DDAVP -On patient's last BMP his sodium level was up to 141 -We will recheck BMP today -No further work-up at this time

## 2022-10-10 NOTE — Assessment & Plan Note (Signed)
-  Patient states that he got his flu and COVID vaccines at [facility] on [address]

## 2022-10-11 ENCOUNTER — Telehealth: Payer: Self-pay | Admitting: *Deleted

## 2022-10-11 LAB — BMP8+ANION GAP
Anion Gap: 14 mmol/L (ref 10.0–18.0)
BUN/Creatinine Ratio: 14 (ref 9–20)
BUN: 15 mg/dL (ref 6–24)
CO2: 24 mmol/L (ref 20–29)
Calcium: 9.4 mg/dL (ref 8.7–10.2)
Chloride: 102 mmol/L (ref 96–106)
Creatinine, Ser: 1.08 mg/dL (ref 0.76–1.27)
Glucose: 86 mg/dL (ref 70–99)
Potassium: 3.9 mmol/L (ref 3.5–5.2)
Sodium: 140 mmol/L (ref 134–144)
eGFR: 88 mL/min/{1.73_m2} (ref >59)

## 2022-10-11 LAB — HEMOGLOBIN A1C
Est. average glucose Bld gHb Est-mCnc: 120 mg/dL
Hgb A1c MFr Bld: 5.8 % — ABNORMAL HIGH (ref 4.8–5.6)

## 2022-10-11 LAB — CK: Total CK: 629 U/L (ref 49–439)

## 2022-10-11 NOTE — Telephone Encounter (Signed)
Received faxed results from [place] with Alert on CK 629 collected yesterday. Placed in PCP's box for review and signature.

## 2022-10-11 NOTE — Addendum Note (Signed)
Addended by: Aldine Contes on: 10/11/2022 10:41 AM   Modules accepted: Orders

## 2022-10-12 ENCOUNTER — Telehealth: Payer: Self-pay | Admitting: Internal Medicine

## 2022-10-12 ENCOUNTER — Other Ambulatory Visit: Payer: Self-pay | Admitting: Internal Medicine

## 2022-10-12 ENCOUNTER — Ambulatory Visit: Payer: Medicare Other | Admitting: Podiatry

## 2022-10-12 ENCOUNTER — Telehealth: Payer: Self-pay

## 2022-10-12 DIAGNOSIS — E785 Hyperlipidemia, unspecified: Secondary | ICD-10-CM

## 2022-10-12 LAB — SPECIMEN STATUS REPORT

## 2022-10-12 LAB — LIPID PANEL
Chol/HDL Ratio: 2.7 ratio (ref 0.0–5.0)
Cholesterol, Total: 146 mg/dL (ref 100–199)
HDL: 55 mg/dL (ref >39)
LDL Chol Calc (NIH): 77 mg/dL (ref 0–99)
Triglycerides: 69 mg/dL (ref 0–149)
VLDL Cholesterol Cal: 14 mg/dL (ref 5–40)

## 2022-10-12 MED ORDER — ROSUVASTATIN CALCIUM 20 MG PO TABS: 20.0000 mg | ORAL_TABLET | Freq: Every day | ORAL | 1 refills | Status: DC

## 2022-10-12 NOTE — Telephone Encounter (Signed)
Requesting to speak with Dr. Dareen Piano about  cholesterol medication. Pt's mother requesting the medication to be changed to another medication. Please call back.

## 2022-10-12 NOTE — Telephone Encounter (Signed)
I called and spoke with the patient's mother.  She is concerned about his rising CK on atorvastatin 40 mg daily.  Atorvastatin does have a higher incidence of myopathies and rosuvastatin so we will transition him to rosuvastatin 20 mg daily and have him follow-up in 1 month for repeat CK.  I also discussed the patient's lipid panel with the mother.  I explained to her that his LDL has improved to 77 on the statin.  If patient still has persistently elevated CKs on the statin he may need to referral to the lipid clinic for possible initiation of PCSK9 given his history of CVA.

## 2022-10-30 ENCOUNTER — Telehealth: Payer: Self-pay | Admitting: Internal Medicine

## 2022-10-30 NOTE — Telephone Encounter (Signed)
Pt's mother calling to report the patient has been sick x 3 days. Pt has been coughing, weak, and unable to sleep due to his cough. Today he has a fever 100.3 with cold chills and throwing up.

## 2022-10-30 NOTE — Telephone Encounter (Signed)
Spoke with patient's mom. No openings in clinic today. She is advised to take patient to UC/ED. States she will give him OTC meds. Explained importance of making sure he stays well hydrated. Offered appt tomorrow at 1015. She declined.

## 2022-10-31 ENCOUNTER — Ambulatory Visit (INDEPENDENT_AMBULATORY_CARE_PROVIDER_SITE_OTHER): Payer: Medicare Other | Admitting: Student

## 2022-10-31 ENCOUNTER — Encounter: Payer: Self-pay | Admitting: Student

## 2022-10-31 VITALS — BP 123/79 | HR 77 | Temp 97.5°F | Ht 68.0 in | Wt 203.0 lb

## 2022-10-31 DIAGNOSIS — J069 Acute upper respiratory infection, unspecified: Secondary | ICD-10-CM | POA: Diagnosis not present

## 2022-10-31 NOTE — Patient Instructions (Addendum)
You can take mucinex DM to help with the cough. If this does not help with the cough please give our office a phone call by this Thursday.

## 2022-10-31 NOTE — Progress Notes (Unsigned)
   CC: cough  HPI:  Nicholas Holt is a 41 y.o. M with Pmh who presents for a cough. History is provided by patient and his father who is present in the clinic.   Patient's cough began about a week ago. It was an intense non productive cough. He had associated sneezing and nasal congestion as well. Patient had no fever, chills, or SOB. He tried tylenol but the cough persisted. He has been vaccinated for flu and coivd.   Of note, his sister was hospitalized with respiratory infection and his father and mother have also had a similar cough.   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  Complicated by left sided motor tic disorder and panhypopituitarism.    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  Includes central diabetes insipidus, secondary hypothyroidism, secondary adrenal insufficiency, and secondary hypogonadism.    Perennial allergic rhinitis 07/09/2015   Pre-diabetes    no meds   Seizure disorder ([place]) 08/08/2006   Predated brain tumor.  Generalized seizure ocurred after running out of lorazepam post-tumor.    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 Treated with alendronate from  2009-2016. DEXA (03/16/2008): L Spine T -2.0. L fem neck T -0.9, R fem neck T -1.9.  DEXA (10/15/2014): L Spine T -1.3, L fem neck T -0.6, R fem neck T -1.8.  Bisphosphonate holiday 06/2015.  Repeat DEXA scan in 10/2016 to reassess.    Review of Systems:  Negative except per above.   Physical Exam:  Vitals:   10/31/22 1023  BP: 123/79  Pulse: 77  Temp: (!) 97.5 F (36.4 C)  TempSrc: Oral  SpO2: 100%  Weight: 203 lb (92.1 kg)  Height: '5\' 8"'$  (0.488 m)   Constitutional: Well-developed, well-nourished, and in no distress. In wheelchair.  Cardiovascular: Normal rate, regular rhythm, intact distal pulses. No gallop and no friction rub.  No murmur heard. No lower extremity edema  Pulmonary: Non labored breathing on room air, no wheezing or rales  Abdominal: Soft. Normal bowel sounds. Non distended and non tender Musculoskeletal: Normal range of motion.        General: No tenderness or edema.  Neurological: Communicates minimally but answers simple questions appropriately. Non focal  Skin: Skin is warm and dry.    Assessment & Plan:   See Encounters Tab for problem based charting.  Patient discussed with Dr.  Cain Sieve

## 2022-11-01 ENCOUNTER — Encounter: Payer: Self-pay | Admitting: Student

## 2022-11-01 ENCOUNTER — Other Ambulatory Visit: Payer: Self-pay

## 2022-11-01 DIAGNOSIS — U071 COVID-19: Secondary | ICD-10-CM

## 2022-11-01 DIAGNOSIS — I10 Essential (primary) hypertension: Secondary | ICD-10-CM

## 2022-11-01 LAB — COVID-19, FLU A+B AND RSV
Influenza A, NAA: NOT DETECTED
Influenza B, NAA: NOT DETECTED
RSV, NAA: NOT DETECTED
SARS-CoV-2, NAA: DETECTED — AB

## 2022-11-01 MED ORDER — LOSARTAN POTASSIUM 25 MG PO TABS: 25.0000 mg | ORAL_TABLET | Freq: Every day | ORAL | 5 refills | Status: DC

## 2022-11-01 NOTE — Telephone Encounter (Signed)
Call from patient's mother feels that patient will have to drink to much water if he takes the Mucinex.  Would like to get something else for patient.  Spoke with Dr. Eulas Post who said that the patient would not have to take a lot of fluids to take the Mucinex.  Patient's mother when asked if the pill would be better stated that  he would have to take a lot of fluids to take this medication and that she did not want to risk him having to drink a lot with this medication.  Patient's mother also felt that Dr. Eulas Post did not understand the patient's need and condition not to take in a lot of fluids.  Explained to mother to have patient take medication during meals that he takes in fluids. Attempts to reassure patient's mom that this would not be yoo much fluid for her son Stated would rather speak with Dr. Dareen Piano about the fluids for her son.  Patient's mom stated that she has not gotten the medication for the patient.

## 2022-11-01 NOTE — Assessment & Plan Note (Signed)
Patient presents with a week of cough and sneezing most concerning for URI. His family has similar symptoms. He has no SOB, fever, or sputum production making a pneumonia less likely. His exam is also unremarkable. (No rales).   Plan: Patient will get OTC mucinex DM for his cough, will swab for COVID/flu. Patient does have h/o panhypopituitarism but given his symptoms are mild he does not require stress dose steroids. Discussed with patient and his father to let us know if he develops fever or feels like he his getting sicker. We may need to increase his cortef.

## 2022-11-02 MED ORDER — GUAIFENESIN-CODEINE 100-10 MG/5ML PO SOLN: 5.0000 mL | Freq: Every evening | ORAL | 0 refills | Status: DC | PRN

## 2022-11-02 NOTE — Assessment & Plan Note (Signed)
I called and spoke to the patient's mother at her request.  Patient's mother is concerned about giving the patient Mucinex D for his cough because he is supposed to take a lot of water with it and he has had issues with hyponatremia in the past from increased water intake in the setting of being on DDAVP for his panhypopituitarism.  She also states that his cough is especially bad at night and he has not been able to sleep secondary to the cough.  We discussed sending in codeine/guaifenesin for use at night and using Robitussin during the day.  Patient's mother is in agreement with plan.  Risks and benefits of codeine/guaifenesin discussed with her.  Patient's mother was also wondering if there was anything that we could prescribe for the COVID itself.  Given that the patient has had symptoms for 7 days he is out of the window for antivirals like Paxlovid.  Only symptomatic management is recommended at this point.  I instructed the mother to call us if his symptoms worsen.  No further workup at this time.  Patient's mother expresses understanding and is in agreement with plan.

## 2022-11-02 NOTE — Progress Notes (Signed)
Internal Medicine Clinic Attending  Case discussed with Dr. Carter  At the time of the visit.  We reviewed the resident's history and exam and pertinent patient test results.  I agree with the assessment, diagnosis, and plan of care documented in the resident's note.  

## 2022-11-02 NOTE — Addendum Note (Signed)
Addended by: Randalyn Rhea on: 11/02/2022 02:13 PM   Modules accepted: Level of Service

## 2022-11-02 NOTE — Progress Notes (Signed)
Called patient's mother 11/02/22 [id number] and informed her of patient's positive COVID test. She states that her son continues to have a significant cough. She is hesitant to give him OTC cough supressant like mucinex DM because she was under the impression that he would need to drink lots of water with this medication and she is concerned this would affect his sodium levels. Discussed with her that she could give him the tablet when he drinks his normal daily fluids or give him the cough syrup form of the medicine. She said that she would consider this and may give a phone call to pt's PCP Dr. Dareen Piano. Also discussed that we do not need to start  paxlovid or molnupiravir at this time given patient's mild symptoms and no SOB as well as he has been symptomatic for ~8-9d. Relayed if he does start complaining about SOB that they should present to the hospital.

## 2022-11-06 ENCOUNTER — Other Ambulatory Visit: Payer: Self-pay | Admitting: Internal Medicine

## 2022-11-06 DIAGNOSIS — I10 Essential (primary) hypertension: Secondary | ICD-10-CM

## 2022-11-08 ENCOUNTER — Other Ambulatory Visit: Payer: Self-pay

## 2022-11-08 ENCOUNTER — Other Ambulatory Visit: Payer: Self-pay | Admitting: Internal Medicine

## 2022-11-08 DIAGNOSIS — I10 Essential (primary) hypertension: Secondary | ICD-10-CM

## 2022-11-08 DIAGNOSIS — G40909 Epilepsy, unspecified, not intractable, without status epilepticus: Secondary | ICD-10-CM

## 2022-11-08 DIAGNOSIS — F4322 Adjustment disorder with anxiety: Secondary | ICD-10-CM

## 2022-11-08 MED ORDER — AMLODIPINE BESYLATE 5 MG PO TABS: 5.0000 mg | ORAL_TABLET | Freq: Every day | ORAL | 3 refills | Status: DC

## 2022-11-11 ENCOUNTER — Encounter: Payer: Self-pay | Admitting: *Deleted

## 2022-12-07 ENCOUNTER — Other Ambulatory Visit: Payer: Self-pay

## 2022-12-07 MED ORDER — ASPIRIN 81 MG PO CHEW: 81.0000 mg | CHEWABLE_TABLET | Freq: Every day | ORAL | 3 refills | Status: DC

## 2022-12-11 DIAGNOSIS — R7303 Prediabetes: Secondary | ICD-10-CM | POA: Diagnosis not present

## 2022-12-19 ENCOUNTER — Other Ambulatory Visit: Payer: Self-pay | Admitting: Internal Medicine

## 2022-12-19 DIAGNOSIS — T380X5A Adverse effect of glucocorticoids and synthetic analogues, initial encounter: Secondary | ICD-10-CM

## 2022-12-21 ENCOUNTER — Other Ambulatory Visit: Payer: Self-pay | Admitting: Internal Medicine

## 2022-12-21 DIAGNOSIS — F4322 Adjustment disorder with anxiety: Secondary | ICD-10-CM

## 2022-12-21 DIAGNOSIS — G40909 Epilepsy, unspecified, not intractable, without status epilepticus: Secondary | ICD-10-CM

## 2023-01-18 DIAGNOSIS — E039 Hypothyroidism, unspecified: Secondary | ICD-10-CM | POA: Diagnosis not present

## 2023-01-18 DIAGNOSIS — N281 Cyst of kidney, acquired: Secondary | ICD-10-CM | POA: Diagnosis not present

## 2023-01-18 DIAGNOSIS — E871 Hypo-osmolality and hyponatremia: Secondary | ICD-10-CM | POA: Diagnosis not present

## 2023-01-18 DIAGNOSIS — N1831 Chronic kidney disease, stage 3a: Secondary | ICD-10-CM | POA: Diagnosis not present

## 2023-01-18 DIAGNOSIS — E232 Diabetes insipidus: Secondary | ICD-10-CM | POA: Diagnosis not present

## 2023-01-18 DIAGNOSIS — M810 Age-related osteoporosis without current pathological fracture: Secondary | ICD-10-CM | POA: Diagnosis not present

## 2023-01-19 LAB — LAB REPORT - SCANNED: EGFR: 90

## 2023-01-22 ENCOUNTER — Other Ambulatory Visit: Payer: Self-pay | Admitting: Internal Medicine

## 2023-01-22 DIAGNOSIS — N281 Cyst of kidney, acquired: Secondary | ICD-10-CM

## 2023-02-05 ENCOUNTER — Other Ambulatory Visit: Payer: Self-pay

## 2023-02-05 ENCOUNTER — Ambulatory Visit (INDEPENDENT_AMBULATORY_CARE_PROVIDER_SITE_OTHER): Payer: 59 | Admitting: Internal Medicine

## 2023-02-05 ENCOUNTER — Encounter: Payer: Self-pay | Admitting: Internal Medicine

## 2023-02-05 VITALS — BP 131/86 | HR 74 | Temp 97.6°F | Ht 68.0 in | Wt 208.8 lb

## 2023-02-05 DIAGNOSIS — G4733 Obstructive sleep apnea (adult) (pediatric): Secondary | ICD-10-CM | POA: Diagnosis not present

## 2023-02-05 DIAGNOSIS — G4701 Insomnia due to medical condition: Secondary | ICD-10-CM

## 2023-02-05 DIAGNOSIS — E785 Hyperlipidemia, unspecified: Secondary | ICD-10-CM | POA: Diagnosis not present

## 2023-02-05 DIAGNOSIS — E871 Hypo-osmolality and hyponatremia: Secondary | ICD-10-CM | POA: Diagnosis not present

## 2023-02-05 DIAGNOSIS — T380X5A Adverse effect of glucocorticoids and synthetic analogues, initial encounter: Secondary | ICD-10-CM

## 2023-02-05 DIAGNOSIS — I6381 Other cerebral infarction due to occlusion or stenosis of small artery: Secondary | ICD-10-CM

## 2023-02-05 DIAGNOSIS — G253 Myoclonus: Secondary | ICD-10-CM

## 2023-02-05 DIAGNOSIS — E230 Hypopituitarism: Secondary | ICD-10-CM | POA: Diagnosis not present

## 2023-02-05 DIAGNOSIS — R748 Abnormal levels of other serum enzymes: Secondary | ICD-10-CM

## 2023-02-05 DIAGNOSIS — F4322 Adjustment disorder with anxiety: Secondary | ICD-10-CM

## 2023-02-05 DIAGNOSIS — M818 Other osteoporosis without current pathological fracture: Secondary | ICD-10-CM

## 2023-02-05 DIAGNOSIS — I10 Essential (primary) hypertension: Secondary | ICD-10-CM | POA: Diagnosis not present

## 2023-02-05 DIAGNOSIS — G40909 Epilepsy, unspecified, not intractable, without status epilepticus: Secondary | ICD-10-CM

## 2023-02-05 MED ORDER — LORAZEPAM 2 MG PO TABS: ORAL_TABLET | ORAL | 0 refills | Status: DC

## 2023-02-05 MED ORDER — AMLODIPINE BESYLATE 5 MG PO TABS: 5.0000 mg | ORAL_TABLET | Freq: Every day | ORAL | 3 refills | Status: DC

## 2023-02-05 NOTE — Assessment & Plan Note (Signed)
Patient's mother with concerns regarding sleep. She notes Nicholas Holt goes to bed around 11pm and will stay awake for an hour or more before falling asleep. He then wakes at 6 or 7 am. She is not sure if he wakes up often during the night. She does note daytime sleepiness. Previous concern for myoclonus as cause of poor sleep and early awakening, although this sounds improved. Continues on lorazepam 6 mg nightly for history of seizure and cyclobenzaprine nightly prn for cramping. Patient's mother raised concerns for screen time affecting sleep and we discussed strategies to mitigate this. Also screened intermediate risk for OSA.   Plan -Continue current medications -Discuss screen time limits and other sleep hygiene prior to sleep -Referral for sleep study to evaluate OSA

## 2023-02-05 NOTE — Assessment & Plan Note (Signed)
History of OSA reported by patient's mother and concerns for poor sleep at today's visit. She does not think he snores anymore after losing some weight. Daytime somnolence noted. STOP-BANG 3, intermediate risk. Agreeable to repeat sleep study to assess options for treatment. Previously did not tolerate full face mask.  Plan -Ambulatory referral to sleep studies

## 2023-02-05 NOTE — Assessment & Plan Note (Signed)
Stable on lorazepam 6 mg nightly. Continue current dose.

## 2023-02-05 NOTE — Assessment & Plan Note (Signed)
Long-standing history of elevated CK without clear etiology. Not symptomatic. Not thought to be secondary to statin use and statin needed for history of lacunar infarcts. Referred to rheumatology at last visit but family did not schedule. We discussed this today and they are agreeable to schedule, phone number for rheumatology provided.  Plan -Refer to rheum -CK level  Plan -Recheck CK

## 2023-02-05 NOTE — Patient Instructions (Addendum)
It was wonderful to meet you today! I am glad you are doing well.  Please contact the office below to schedule an appointment for elevated CK levels: [facility] Rheumatologist in [place], [place] in: [place] office suites Address: [address], [place], [place] [address] Phone: [phone]  I have sent a new referral for sleep study. Please try to limit your screen time at least 1-2 hours before bed to help with good sleep.

## 2023-02-05 NOTE — Assessment & Plan Note (Signed)
DEXA pending May 2024 to evaluate for drug holiday from alendronate. Alendronate 2009-2016, resumed in 2021.

## 2023-02-05 NOTE — Progress Notes (Signed)
Established Patient Office Visit  Subjective   Patient ID: ZASHAWN CONKEY, male    DOB: 07-08-1981  Age: 42 y.o. MRN: [id number]  Chief Complaint  Patient presents with   Meet new doctor    Mr. Jaeceon Smethurst returns to clinic for follow-up of chronic conditions and first visit with me, as well as to discuss sleep issues. Please see assessment/plan in problem-based charting for further details of today's visit.     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) ([place]) 08/08/2006   Hyperlipidemia 08/08/2006   Attention deficit disorder 08/08/2006   Seizure disorder ([place]) 08/08/2006      Objective:     BP 131/86 (BP Location: Right Arm, Patient Position: Sitting, Cuff Size: Normal)   Pulse 74   Temp 97.6 F (36.4 C) (Oral)   Ht 5\' 8"  (1.727 m)   Wt 208 lb 12.8 oz (94.7 kg)   SpO2 100% Comment: RA  BMI 31.75 kg/m  BP Readings from Last 3 Encounters:  02/05/23 131/86  10/31/22 123/79  10/10/22 126/89   Wt Readings from Last 3 Encounters:  02/05/23 208 lb 12.8 oz (94.7 kg)  10/31/22 203 lb (92.1 kg)  10/10/22 201 lb 1.6 oz (91.2 kg)     Physical Exam Vitals reviewed.  Constitutional:      General: He is not in acute distress.    Appearance: Normal appearance.  Cardiovascular:     Rate and Rhythm: Normal rate and regular rhythm.     Heart sounds: Normal heart sounds.   Pulmonary:     Effort: Pulmonary effort is normal. No respiratory distress.     Breath sounds: Normal breath sounds.  Musculoskeletal:        General: No swelling.     Cervical back: Normal range of motion.  Skin:    General: Skin is warm and dry.  Neurological:     General: No focal deficit present.     Mental Status: He is alert.  Psychiatric:        Mood and Affect: Mood normal.        Behavior: Behavior normal.       Assessment & Plan:   Problem List Items Addressed This Visit       Cardiovascular and Mediastinum   Hypertension    Well managed today. Continue current medications.  Plan -Continue amlodipine 5 mg and losartan 25 mg daily -Bmp today      Relevant Medications   amLODipine (NORVASC) 5 MG tablet   Other Relevant Orders   BMP8+Anion Gap     Respiratory   OSA (obstructive sleep apnea)    History of OSA reported by patient's mother and concerns for poor sleep at today's visit. She does not think he snores anymore after losing some weight. Daytime somnolence noted. STOP-BANG 3, intermediate risk. Agreeable to repeat sleep study to assess options for treatment. Previously did not tolerate full face mask.  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 Buddy Duty, endocrinology. Adherent to desmopressin, testosterone gel, hydrocortisone, and levothyroxine. F/u scheduled April 2024.  Plan -BMP to check sodium        Nervous and Auditory   Seizure disorder ([facility]) (Chronic)    Stable on lorazepam 6 mg nightly. Continue current dose.      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 She notes Deatrick goes to bed around 11pm and will stay awake for an hour or more before falling  asleep. He then wakes at 6 or 7 am. She is not sure if he wakes up often during the night. She does note daytime sleepiness. Previous concern for myoclonus as cause of poor sleep and early awakening, although this sounds improved. Continues on lorazepam 6 mg nightly for history of seizure and cyclobenzaprine nightly prn for cramping. Patient's mother raised concerns for screen time affecting sleep and we discussed strategies to mitigate this. Also screened intermediate risk for OSA.   Plan -Continue current medications -Discuss screen time limits and other sleep hygiene prior to sleep -Referral for sleep study to evaluate OSA      Multiple lacunar infarcts ([facility])    Stable. Continues on aspirin and high-intensity statin. We discussed the importance of these medications today and will continue.        Musculoskeletal and Integument   Steroid-induced osteoporosis    DEXA pending May 2024 to evaluate for drug holiday from alendronate. Alendronate 2009-2016, resumed in 2021.        Other   Hyperlipidemia (Chronic)    Switched from atorvastatin to rosuvastatin following last visit in November given family concerns regarding elevated CK and statin myopathy. Elevated CK levels predated statin use and patient does need high-intensity statin for history of lacunar infarcts. Will continue at current dose, recheck lipid panel at next visit.      Relevant Medications   amLODipine (NORVASC) 5 MG tablet   Hyponatremia   Relevant Orders   BMP8+Anion Gap   Elevated CK - Primary    Long-standing history of elevated CK without clear etiology. Not symptomatic. Not thought to be secondary to statin use and statin needed for history of lacunar infarcts. Referred to rheumatology at last visit but family did not schedule. We discussed this today and they are agreeable to schedule, phone number for rheumatology provided.  Plan -Refer to rheum -CK level  Plan -Recheck CK      Relevant Orders   CK,  total   Other Visit Diagnoses     Adjustment disorder with anxious mood       Relevant Medications   LORazepam (ATIVAN) 2 MG tablet       Return in about 3 months (around 05/08/2023) for fu.    Charise Killian, MD

## 2023-02-05 NOTE — Assessment & Plan Note (Signed)
Well managed today. Continue current medications.  Plan -Continue amlodipine 5 mg and losartan 25 mg daily -Bmp today

## 2023-02-05 NOTE — Assessment & Plan Note (Signed)
Follows with Dr. Buddy Duty, endocrinology. Adherent to desmopressin, testosterone gel, hydrocortisone, and levothyroxine. F/u scheduled April 2024.  Plan -BMP to check sodium

## 2023-02-05 NOTE — Assessment & Plan Note (Signed)
Switched from atorvastatin to rosuvastatin following last visit in November given family concerns regarding elevated CK and statin myopathy. Elevated CK levels predated statin use and patient does need high-intensity statin for history of lacunar infarcts. Will continue at current dose, recheck lipid panel at next visit.

## 2023-02-05 NOTE — Assessment & Plan Note (Signed)
Stable. Continues on aspirin and high-intensity statin. We discussed the importance of these medications today and will continue.

## 2023-02-06 LAB — CK: Total CK: 438 U/L (ref 49–439)

## 2023-02-06 LAB — BMP8+ANION GAP
Anion Gap: 18 mmol/L (ref 10.0–18.0)
BUN/Creatinine Ratio: 18 (ref 9–20)
BUN: 18 mg/dL (ref 6–24)
CO2: 19 mmol/L — ABNORMAL LOW (ref 20–29)
Calcium: 9.3 mg/dL (ref 8.7–10.2)
Chloride: 100 mmol/L (ref 96–106)
Creatinine, Ser: 0.99 mg/dL (ref 0.76–1.27)
Glucose: 77 mg/dL (ref 70–99)
Potassium: 4 mmol/L (ref 3.5–5.2)
Sodium: 137 mmol/L (ref 134–144)
eGFR: 98 mL/min/{1.73_m2} (ref >59)

## 2023-02-06 NOTE — Progress Notes (Signed)
Reviewed lab results with patient's mother.

## 2023-02-07 ENCOUNTER — Ambulatory Visit
Admission: RE | Admit: 2023-02-07 | Discharge: 2023-02-07 | Disposition: A | Discharge status: Home / Self Care | Payer: 59 | Source: Ambulatory Visit | Attending: Internal Medicine | Admitting: Internal Medicine

## 2023-02-07 DIAGNOSIS — R188 Other ascites: Secondary | ICD-10-CM | POA: Diagnosis not present

## 2023-02-07 DIAGNOSIS — N281 Cyst of kidney, acquired: Secondary | ICD-10-CM | POA: Diagnosis not present

## 2023-02-27 ENCOUNTER — Other Ambulatory Visit: Payer: Self-pay | Admitting: Internal Medicine

## 2023-02-27 DIAGNOSIS — I10 Essential (primary) hypertension: Secondary | ICD-10-CM

## 2023-03-01 DIAGNOSIS — R7309 Other abnormal glucose: Secondary | ICD-10-CM | POA: Diagnosis not present

## 2023-03-01 DIAGNOSIS — E230 Hypopituitarism: Secondary | ICD-10-CM | POA: Diagnosis not present

## 2023-03-01 DIAGNOSIS — E232 Diabetes insipidus: Secondary | ICD-10-CM | POA: Diagnosis not present

## 2023-03-01 DIAGNOSIS — E785 Hyperlipidemia, unspecified: Secondary | ICD-10-CM | POA: Diagnosis not present

## 2023-03-07 DIAGNOSIS — E785 Hyperlipidemia, unspecified: Secondary | ICD-10-CM | POA: Diagnosis not present

## 2023-03-07 DIAGNOSIS — R7303 Prediabetes: Secondary | ICD-10-CM | POA: Diagnosis not present

## 2023-03-07 DIAGNOSIS — E232 Diabetes insipidus: Secondary | ICD-10-CM | POA: Diagnosis not present

## 2023-03-07 DIAGNOSIS — E230 Hypopituitarism: Secondary | ICD-10-CM | POA: Diagnosis not present

## 2023-03-17 ENCOUNTER — Other Ambulatory Visit: Payer: Self-pay | Admitting: Internal Medicine

## 2023-03-17 DIAGNOSIS — F4322 Adjustment disorder with anxiety: Secondary | ICD-10-CM

## 2023-03-17 DIAGNOSIS — G40909 Epilepsy, unspecified, not intractable, without status epilepticus: Secondary | ICD-10-CM

## 2023-04-03 ENCOUNTER — Ambulatory Visit
Admission: RE | Admit: 2023-04-03 | Discharge: 2023-04-03 | Disposition: A | Discharge status: Home / Self Care | Payer: 59 | Source: Ambulatory Visit | Attending: Internal Medicine | Admitting: Internal Medicine

## 2023-04-03 DIAGNOSIS — E232 Diabetes insipidus: Secondary | ICD-10-CM | POA: Diagnosis not present

## 2023-04-03 DIAGNOSIS — M8580 Other specified disorders of bone density and structure, unspecified site: Secondary | ICD-10-CM | POA: Diagnosis not present

## 2023-04-03 DIAGNOSIS — Z7952 Long term (current) use of systemic steroids: Secondary | ICD-10-CM | POA: Diagnosis not present

## 2023-04-03 DIAGNOSIS — M818 Other osteoporosis without current pathological fracture: Secondary | ICD-10-CM

## 2023-04-03 DIAGNOSIS — R7309 Other abnormal glucose: Secondary | ICD-10-CM | POA: Diagnosis not present

## 2023-04-03 DIAGNOSIS — E230 Hypopituitarism: Secondary | ICD-10-CM | POA: Diagnosis not present

## 2023-04-03 DIAGNOSIS — E785 Hyperlipidemia, unspecified: Secondary | ICD-10-CM | POA: Diagnosis not present

## 2023-04-06 NOTE — Progress Notes (Signed)
Z-score of L-spine and femur with improvement since last DEXA in 2021. Called to discuss with Nicholas Holt' mother. Given improvement and desire to avoid side effects, we will plan for bisphosphonate drug holiday as he has completed an additional three years (2021-2024) of treatment after receiving an initial seven years (2009-2016). We will consider repeat DEXA in 1-2 years to monitor. Nicholas Holt will stop taking weekly alendronate.

## 2023-04-10 ENCOUNTER — Other Ambulatory Visit: Payer: Self-pay | Admitting: Internal Medicine

## 2023-04-10 DIAGNOSIS — G40909 Epilepsy, unspecified, not intractable, without status epilepticus: Secondary | ICD-10-CM

## 2023-04-10 DIAGNOSIS — F4322 Adjustment disorder with anxiety: Secondary | ICD-10-CM

## 2023-04-23 ENCOUNTER — Other Ambulatory Visit: Payer: Self-pay

## 2023-04-23 ENCOUNTER — Ambulatory Visit (INDEPENDENT_AMBULATORY_CARE_PROVIDER_SITE_OTHER): Payer: 59

## 2023-04-23 ENCOUNTER — Ambulatory Visit (INDEPENDENT_AMBULATORY_CARE_PROVIDER_SITE_OTHER): Payer: 59 | Admitting: Internal Medicine

## 2023-04-23 ENCOUNTER — Encounter: Payer: Self-pay | Admitting: Internal Medicine

## 2023-04-23 VITALS — BP 105/68 | HR 76 | Temp 97.7°F | Ht 68.0 in | Wt 207.2 lb

## 2023-04-23 DIAGNOSIS — Z6831 Body mass index (BMI) 31.0-31.9, adult: Secondary | ICD-10-CM

## 2023-04-23 DIAGNOSIS — T380X5A Adverse effect of glucocorticoids and synthetic analogues, initial encounter: Secondary | ICD-10-CM | POA: Diagnosis not present

## 2023-04-23 DIAGNOSIS — G40909 Epilepsy, unspecified, not intractable, without status epilepticus: Secondary | ICD-10-CM

## 2023-04-23 DIAGNOSIS — I10 Essential (primary) hypertension: Secondary | ICD-10-CM

## 2023-04-23 DIAGNOSIS — E66811 Obesity, class 1: Secondary | ICD-10-CM | POA: Insufficient documentation

## 2023-04-23 DIAGNOSIS — E230 Hypopituitarism: Secondary | ICD-10-CM

## 2023-04-23 DIAGNOSIS — G4733 Obstructive sleep apnea (adult) (pediatric): Secondary | ICD-10-CM | POA: Diagnosis not present

## 2023-04-23 DIAGNOSIS — Z0000 Encounter for general adult medical examination without abnormal findings: Secondary | ICD-10-CM

## 2023-04-23 DIAGNOSIS — M818 Other osteoporosis without current pathological fracture: Secondary | ICD-10-CM | POA: Diagnosis not present

## 2023-04-23 DIAGNOSIS — R252 Cramp and spasm: Secondary | ICD-10-CM | POA: Diagnosis not present

## 2023-04-23 DIAGNOSIS — E669 Obesity, unspecified: Secondary | ICD-10-CM

## 2023-04-23 NOTE — Assessment & Plan Note (Signed)
Referred for sleep study at last visit, authorized and VM left to schedule. Will f/u with patient's mother regarding scheduling.

## 2023-04-23 NOTE — Progress Notes (Addendum)
Established Patient Office Visit  Subjective   Patient ID: Nicholas Holt, male    DOB: Oct 07, 1981  Age: 42 y.o. MRN: [id number]  Chief Complaint  Patient presents with   Follow-up    Nicholas Holt returns to clinic today to f/u chronic medical conditions. He has no acute concerns. He is accompanied by his father. Last [facility] visit was 02/05/23. Please see assessment/plan in problem-based charting for further details of today's visit.    Addendum 05/03/23 Reviewed medications with patient's mother, Nicholas Holt, by telephone 6/20. Hydrocortisone decreased to 5 mg at last visit with Dr. Sharl Ma. Potassium chloride 20 mEq once daily. Updated list. Nicholas Holt also notes she returned the call to sleep center to schedule sleep study and has not received a call back. Will touch base with [place] to see if we can help facilitate scheduling.  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   Cardiovascular:  Negative for chest pain.  Musculoskeletal:  Negative for myalgias.  Neurological:  Negative for dizziness and seizures.       Objective:     BP 105/68 (BP Location: Right Arm, Patient Position: Sitting, Cuff Size: Large)   Pulse 76   Temp 97.7 F (36.5 C) (Oral)   Ht 5\' 8"  (1.727 m)   Wt 207 lb 3.2 oz (94 kg)   SpO2 99% Comment: RA  BMI 31.50 kg/m  BP Readings from Last 3 Encounters:  04/23/23 105/68  02/05/23 131/86  10/31/22 123/79   Wt Readings from Last 3 Encounters:  04/23/23 207 lb 3.2 oz (94 kg)  02/05/23 208 lb 12.8 oz (94.7 kg)  10/31/22 203 lb (92.1 kg)      Physical Exam Vitals reviewed.  Constitutional:      General: He is not in acute distress.    Appearance: Normal appearance.  Cardiovascular:     Rate and Rhythm: Normal rate and regular rhythm.     Heart sounds: Normal heart sounds.  Pulmonary:     Effort: Pulmonary effort is normal.     Breath sounds: Normal breath sounds.  Musculoskeletal:        General: No swelling.     Comments: Brace on left leg  Neurological:     Mental Status: He is alert.     Gait: Gait normal.  Psychiatric:        Mood and Affect: Mood normal.        Behavior: Behavior normal.      Assessment & Plan:   Problem List Items Addressed This Visit       Cardiovascular and Mediastinum   Hypertension - Primary    Blood pressure a little on the lower side today,  stable at recent visits. Asymptomatic. Continue current regimen.  Plan -Continue amlodipine 5 mg and losartan 25 mg daily -Unable to obtain BMP today due to difficult stick, patient will return next week        Respiratory   OSA (obstructive sleep apnea)    Referred for sleep study at last visit, authorized and VM left to schedule. Will f/u with patient's mother regarding scheduling.        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 Sharl Ma, endocrinology, last appointment 04/03/23 with 4 week f/u recommended. Hydrocortisone decreased to 5 mg daily at that time. BP slightly lower today but asymptomatic. Will  check BMP early next week. Continues on desmopressin, testosterone gel, and levothyroxine.   Plan -BMP next week for sodium -F/u with Dr. Sharl Ma later this month      Relevant Orders   BMP8+Anion Gap     Nervous and Auditory   Seizure disorder ([facility]) (Chronic)    Stable on lorazepam 6 mg nightly. No recent seizures. Continue current dose.        Musculoskeletal and Integument   Steroid-induced osteoporosis    Repeat DEXA while on bisphosphonate therapy showed significant change in L-spine and femur Z-score. I discussed these results with patient's mother following the study and have recommended another drug holiday given risk of side effects and adverse events with prolonged treatment. Will plan to repeat DEXA in 1-2 years for monitoring. Message sent to Dr. Sharl Ma to notify of medication change.  Plan -Off alendronate -DEXA in 1-2 years        Other   Class 1 obesity with serious comorbidity and body mass index (BMI) of 31.0 to 31.9 in adult    Weight stable, remains in class 1 obesity category. Associated with hypertension, hyperlipidemia, and prediabetes. Likely lifestyle related, possible medication contribution although steroids are low-dose. Discussed lifestyle modification today.       Return in about 6 months (around 10/23/2023).    Dickie La, MD

## 2023-04-23 NOTE — Patient Instructions (Signed)
Health Maintenance, Male Adopting a healthy lifestyle and getting preventive care are important in promoting health and wellness. Ask your health care provider about: The right schedule for you to have regular tests and exams. Things you can do on your own to prevent diseases and keep yourself healthy. What should I know about diet, weight, and exercise? Eat a healthy diet  Eat a diet that includes plenty of vegetables, fruits, low-fat dairy products, and lean protein. Do not eat a lot of foods that are high in solid fats, added sugars, or sodium. Maintain a healthy weight Body mass index (BMI) is a measurement that can be used to identify possible weight problems. It estimates body fat based on height and weight. Your health care provider can help determine your BMI and help you achieve or maintain a healthy weight. Get regular exercise Get regular exercise. This is one of the most important things you can do for your health. Most adults should: Exercise for at least 150 minutes each week. The exercise should increase your heart rate and make you sweat (moderate-intensity exercise). Do strengthening exercises at least twice a week. This is in addition to the moderate-intensity exercise. Spend less time sitting. Even light physical activity can be beneficial. Watch cholesterol and blood lipids Have your blood tested for lipids and cholesterol at 43 years of age, then have this test every 5 years. You may need to have your cholesterol levels checked more often if: Your lipid or cholesterol levels are high. You are older than 43 years of age. You are at high risk for heart disease. What should I know about cancer screening? Many types of cancers can be detected early and may often be prevented. Depending on your health history and family history, you may need to have cancer screening at various ages. This may include screening for: Colorectal cancer. Prostate cancer. Skin cancer. Lung  cancer. What should I know about heart disease, diabetes, and high blood pressure? Blood pressure and heart disease High blood pressure causes heart disease and increases the risk of stroke. This is more likely to develop in people who have high blood pressure readings or are overweight. Talk with your health care provider about your target blood pressure readings. Have your blood pressure checked: Every 3-5 years if you are 18-39 years of age. Every year if you are 40 years old or older. If you are between the ages of 65 and 75 and are a current or former smoker, ask your health care provider if you should have a one-time screening for abdominal aortic aneurysm (AAA). Diabetes Have regular diabetes screenings. This checks your fasting blood sugar level. Have the screening done: Once every three years after age 45 if you are at a normal weight and have a low risk for diabetes. More often and at a younger age if you are overweight or have a high risk for diabetes. What should I know about preventing infection? Hepatitis B If you have a higher risk for hepatitis B, you should be screened for this virus. Talk with your health care provider to find out if you are at risk for hepatitis B infection. Hepatitis C Blood testing is recommended for: Everyone born from 1945 through 1965. Anyone with known risk factors for hepatitis C. Sexually transmitted infections (STIs) You should be screened each year for STIs, including gonorrhea and chlamydia, if: You are sexually active and are younger than 43 years of age. You are older than 43 years of age and your   health care provider tells you that you are at risk for this type of infection. Your sexual activity has changed since you were last screened, and you are at increased risk for chlamydia or gonorrhea. Ask your health care provider if you are at risk. Ask your health care provider about whether you are at high risk for HIV. Your health care provider  may recommend a prescription medicine to help prevent HIV infection. If you choose to take medicine to prevent HIV, you should first get tested for HIV. You should then be tested every 3 months for as long as you are taking the medicine. Follow these instructions at home: Alcohol use Do not drink alcohol if your health care provider tells you not to drink. If you drink alcohol: Limit how much you have to 0-2 drinks a day. Know how much alcohol is in your drink. In the [place] one drink equals one 12 oz bottle of beer (355 mL), one 5 oz glass of wine (148 mL), or one 1 oz glass of hard liquor (44 mL). Lifestyle Do not use any products that contain nicotine or tobacco. These products include cigarettes, chewing tobacco, and vaping devices, such as e-cigarettes. If you need help quitting, ask your health care provider. Do not use street drugs. Do not share needles. Ask your health care provider for help if you need support or information about quitting drugs. General instructions Schedule regular health, dental, and eye exams. Stay current with your vaccines. Tell your health care provider if: You often feel depressed. You have ever been abused or do not feel safe at home. Summary Adopting a healthy lifestyle and getting preventive care are important in promoting health and wellness. Follow your health care provider's instructions about healthy diet, exercising, and getting tested or screened for diseases. Follow your health care provider's instructions on monitoring your cholesterol and blood pressure. This information is not intended to replace advice given to you by your health care provider. Make sure you discuss any questions you have with your health care provider. Document Revised: 03/21/2021 Document Reviewed: 03/21/2021 Elsevier Patient Education  2024 Elsevier Inc.  

## 2023-04-23 NOTE — Assessment & Plan Note (Signed)
Weight stable, remains in class 1 obesity category. Associated with hypertension, hyperlipidemia, and prediabetes. Likely lifestyle related, possible medication contribution although steroids are low-dose. Discussed lifestyle modification today.

## 2023-04-23 NOTE — Assessment & Plan Note (Signed)
Blood pressure a little on the lower side today, stable at recent visits. Asymptomatic. Continue current regimen.  Plan -Continue amlodipine 5 mg and losartan 25 mg daily -Unable to obtain BMP today due to difficult stick, patient will return next week

## 2023-04-23 NOTE — Assessment & Plan Note (Signed)
Follows with Dr. Sharl Ma, endocrinology, last appointment 04/03/23 with 4 week f/u recommended. Hydrocortisone decreased to 5 mg daily at that time. BP slightly lower today but asymptomatic. Will check BMP early next week. Continues on desmopressin, testosterone gel, and levothyroxine.   Plan -BMP next week for sodium -F/u with Dr. Sharl Ma later this month

## 2023-04-23 NOTE — Assessment & Plan Note (Signed)
Stable on lorazepam 6 mg nightly. No recent seizures. Continue current dose.

## 2023-04-23 NOTE — Patient Instructions (Signed)
It was wonderful to see you today!  Keep exercising with walking and bowling. This will keep your muscles and bones strong.  Continue to follow with Dr. Sharl Ma (endocrinology) and Dr. Glenna Fellows (nephrology).

## 2023-04-23 NOTE — Progress Notes (Signed)
Subjective:   Nicholas Holt is a 42 y.o. male who presents for Medicare Annual/Subsequent preventive examination.  Review of Systems    Defer to PCP.        Objective:    Today's Vitals   04/23/23 1502  BP: 105/68  Pulse: 76  Temp: 97.7 F (36.5 C)  TempSrc: Oral  SpO2: 99%  Weight: 207 lb 3.2 oz (94 kg)  Height: 5\' 8"  (1.727 m)   Body mass index is 31.5 kg/m.     04/23/2023    3:13 PM 04/23/2023    9:34 AM 02/05/2023    8:20 AM 10/31/2022   10:25 AM 10/10/2022   10:16 AM 07/06/2022   10:30 AM 06/06/2022   11:53 AM  Advanced Directives  Does Patient Have a Medical Advance Directive? Yes Yes No No No No No  Type of Estate agent of [place];Living will Healthcare Power of [place];Living will       Does patient want to make changes to medical advance directive? No - Patient declined No - Patient declined    No - Patient declined   Copy of Healthcare Power of Attorney in Chart? No - copy requested No - copy requested       Would patient like information on creating a medical advance directive? No - Patient declined  No - Patient declined No - Patient declined No - Patient declined No - Patient declined     Current Medications (verified) Outpatient Encounter Medications as of 04/23/2023  Medication Sig   alendronate (FOSAMAX) 70 MG tablet TAKE 1 TABLET BY MOUTH 1 TIME A WEEK AS DIRECTED   amLODipine (NORVASC) 5 MG tablet Take 1 tablet (5 mg total) by mouth daily.   Ascorbic Acid (VITAMIN C) 1000 MG tablet Take 1,000 mg by mouth daily.   aspirin 81 MG chewable tablet Chew 1 tablet (81 mg total) by mouth daily.   Blood Pressure Monitor KIT Please take your blood pressure daily in the morning   Coenzyme Q10 (COQ10) 100 MG CAPS Take 100 mg by mouth in the morning.   cyclobenzaprine (FLEXERIL) 10 MG tablet Take 1 tablet (10 mg total) by mouth at bedtime as needed for muscle spasms.   desmopressin (DDAVP NASAL) 0.01 % solution USE ONE SPRAY IN EACH  NOSTRIL FOUR TIMES DAILY (Patient taking differently: Place 10 mcg into the nose in the morning, at noon, in the evening, and at bedtime.)   hydrocortisone (CORTEF) 10 MG tablet Take 1 tablet (10 mg total) by mouth in the morning.   levothyroxine (SYNTHROID) 100 MCG tablet Take 100 mcg by mouth daily before breakfast.   LORazepam (ATIVAN) 2 MG tablet TAKE 3 TABLETS(6 MG) BY MOUTH AT BEDTIME   losartan (COZAAR) 25 MG tablet TAKE 1 TABLET(25 MG) BY MOUTH DAILY   potassium chloride (KLOR-CON) 10 MEQ tablet TAKE 2 TABLETS(20 MEQ) BY MOUTH TWICE DAILY   rosuvastatin (CRESTOR) 20 MG tablet Take 1 tablet (20 mg total) by mouth daily.   Testosterone (ANDROGEL PUMP) 20.25 MG/ACT (1.62%) GEL Place 20.25 mg onto the skin daily. Apply 6 pumps 3 pumps per side) topically to shoulders or upper back once a day (Patient taking differently: Place 6 Pump onto the skin at bedtime. Apply 6 pumps 3 pumps per side) topically to shoulders or upper back once a day)   No facility-administered encounter medications on file as of 04/23/2023.    Allergies (verified) Patient has no active allergies.   History: Past Medical History:  Diagnosis Date   Anxiety    Attention deficit disorder 08/08/2006   Bilateral leg cramps 11/25/2006   Blood transfusion without reported diagnosis 1997   Chemotherapy associated   Cough 12/08/2015   COVID-19 10/16/2017   Elevated hemidiaphragm 07/09/2015   Left, presumably after portacath placement.    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  Complicated by left sided motor tic disorder and panhypopituitarism.    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  Includes central diabetes insipidus, secondary hypothyroidism, secondary adrenal insufficiency, and secondary hypogonadism.    Perennial allergic rhinitis 07/09/2015   Pre-diabetes    no meds   Seizure disorder (HCC) 08/08/2006   Predated brain tumor.  Generalized seizure ocurred after running out of lorazepam post-tumor.    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 Treated with alendronate from 2009-2016. DEXA (03/16/2008): L Spine T -2.0. L fem neck T -0.9, R fem neck T -1.9.  DEXA (10/15/2014): L Spine T -1.3, L fem neck T -0.6, R fem neck T -1.8.  Bisphosphonate holiday 06/2015.  Repeat DEXA scan in 10/2016 to reassess.    Past Surgical History:  Procedure Laterality Date   BRAIN BIOPSY  11/14/1995   CSF SHUNT  11/14/1995   PORTACATH PLACEMENT     portacath removal     RADIOLOGY WITH ANESTHESIA N/A 08/30/2021   Procedure: MRI WITH ANESTHESIA BRAIN WITH AND WITHOUT CONTRAST;  Surgeon: Radiologist, Medication, MD;  Location: [facility] OR;  Service: Radiology;  Laterality: N/A;   RADIOLOGY WITH ANESTHESIA N/A 05/25/2022   Procedure: MRI WITH ANESTHESIA OF BRAIN WITH AND WITHOUT CONTRAST;  Surgeon: Radiologist, Medication, MD;  Location: [facility] OR;  Service: Radiology;  Laterality: N/A;   Family History  Problem Relation Age of Onset   Hypertension Mother    Hypertension Father    Hyperlipidemia Father    Prostate cancer Father    Diabetes type I Sister        Died of complications of diabetes in her 50's   Social History   Socioeconomic History   Marital status: Single    Spouse name: Not on file   Number of children: Not on file   Years of education: Not on file   Highest education level: Not on file  Occupational History   Not on file  Tobacco Use   Smoking status: Never   Smokeless tobacco: Never  Vaping Use   Vaping Use: Never  used  Substance and Sexual Activity   Alcohol use: No    Alcohol/week: 0.0 standard drinks of alcohol   Drug use: No   Sexual activity: Not on file  Other Topics Concern   Not on file  Social History Narrative   Lives with parents.  Parents retired. Father or mother take him to appointments.  Mother sends notes if she can not take him.   Social Determinants of Health   Financial Resource Strain: Low Risk  (03/17/2022)   Overall Financial Resource Strain (CARDIA)    Difficulty of Paying Living Expenses: Not hard at all  Food Insecurity: Patient Declined (04/23/2023)  Hunger Vital Sign    Worried About Running Out of Food in the Last Year: Patient declined    Ran Out of Food in the Last Year: Patient declined  Transportation Needs: Patient Declined (04/23/2023)   PRAPARE - Administrator, Civil Service (Medical): Patient declined    Lack of Transportation (Non-Medical): Patient declined  Physical Activity: Patient Declined (04/23/2023)   Exercise Vital Sign    Days of Exercise per Week: Patient declined    Minutes of Exercise per Session: Patient declined  Stress: Patient Declined (04/23/2023)   [employer] of Occupational Health - Occupational Stress Questionnaire    Feeling of Stress : Patient declined  Social Connections: Patient Declined (04/23/2023)   Social Connection and Isolation Panel [NHANES]    Frequency of Communication with Friends and Family: Patient declined    Frequency of Social Gatherings with Friends and Family: Patient declined    Attends Religious Services: Patient declined    Database administrator or Organizations: Patient declined    Attends Engineer, structural: Patient declined    Marital Status: Patient declined    Tobacco Counseling Counseling given: Not Answered   Clinical Intake:  Pre-visit preparation completed: Yes  Pain : No/denies pain     BMI - recorded: 31.5 Nutritional Status: BMI > 30  Obese Nutritional  Risks: None Diabetes: No  How often do you need to have someone help you when you read instructions, pamphlets, or other written materials from your doctor or pharmacy?: 3 - Sometimes What is the last grade level you completed in school?: 12TH GRADE  Diabetic?NO   Interpreter Needed?: No  Information entered by :: Priscilla Finklea, CMA 04/23/2023   Activities of Daily Living    04/23/2023    3:14 PM 04/23/2023    9:34 AM  In your present state of health, do you have any difficulty performing the following activities:  Hearing? 0 0  Vision? 0 0  Difficulty concentrating or making decisions? 1 1  Walking or climbing stairs? 1 1  Dressing or bathing? 0 0  Doing errands, shopping? 1 1    Patient Care Team: Dickie La, MD as PCP - General (Internal Medicine)  Indicate any recent Medical Services you may have received from other than [facility] providers in the past year (date may be approximate).     Assessment:   This is a routine wellness examination for Rogie.  Hearing/Vision screen No results found.  Dietary issues and exercise activities discussed:     Goals Addressed   None   Depression Screen    04/23/2023    3:14 PM 04/23/2023    3:12 PM 04/23/2023    9:34 AM 02/05/2023    8:19 AM 10/31/2022   10:25 AM 10/10/2022   10:18 AM 06/02/2022    9:30 AM  PHQ 2/9 Scores  PHQ - 2 Score 0 0 0 0 0 0 0    Fall Risk    04/23/2023    3:13 PM 04/23/2023    9:33 AM 02/05/2023    8:19 AM 10/31/2022   10:24 AM 10/10/2022   10:16 AM  Fall Risk   Falls in the past year? 0 0 0 1 0  Number falls in past yr: 0 0 0 0 0  Injury with Fall? 0 0 0 0 0  Risk for fall due to : Impaired mobility;No Fall Risks No Fall Risks;Impaired mobility Impaired mobility No Fall Risks Impaired balance/gait  Follow up Falls evaluation completed;Falls  prevention discussed Falls evaluation completed;Falls prevention discussed Falls evaluation completed;Falls prevention discussed Falls evaluation completed;Falls  prevention discussed Falls evaluation completed    FALL RISK PREVENTION PERTAINING TO THE HOME:  Any stairs in or around the home?  Pt declined answering this question.  If so, are there any without handrails? Pt declined answering this question.  Home free of loose throw rugs in walkways, pet beds, electrical cords, etc? Pt declined answering this question.  Adequate lighting in your home to reduce risk of falls? Pt declined answering this question.   ASSISTIVE DEVICES UTILIZED TO PREVENT FALLS:  Life alert? Pt declined answering this question.  Use of a cane, walker or w/c? Pt declined answering this question.  Grab bars in the bathroom? Pt declined answering this question.  Shower chair or bench in shower? Pt declined answering this question.  Elevated toilet seat or a handicapped toilet? Pt declined answering this question.   TIMED UP AND GO:  Was the test performed? No .  Length of time to ambulate 10 feet: n/a sec.     Cognitive Function: Defer to PCP.        03/17/2022   12:06 PM  6CIT Screen  What Year? 0 points  What month? 0 points  What time? 0 points  Count back from 20 0 points  Months in reverse 0 points  Repeat phrase 0 points  Total Score 0 points    Immunizations Immunization History  Administered Date(s) Administered   Influenza Split 08/10/2011, 10/03/2012, 08/28/2014   Influenza Whole 09/05/2006, 09/04/2007, 09/07/2009, 09/02/2010   Influenza,inj,Quad PF,6+ Mos 09/17/2013, 07/09/2015, 08/19/2019, 08/15/2021   Influenza-Unspecified 06/13/2017   PFIZER(Purple Top)SARS-COV-2 Vaccination 01/11/2020, 02/10/2020, 08/28/2020   Pneumococcal Conjugate-13 11/24/2016   Tdap 08/10/2011, 06/02/2022   Unspecified SARS-COV-2 Vaccination 08/01/2022    TDAP status: Up to date  Flu Vaccine status: Up to date  Covid-19 vaccine status: Information provided on how to obtain vaccines.   Qualifies for Shingles Vaccine? No   Zostavax completed No    Screening  Tests Health Maintenance  Topic Date Due   COVID-19 Vaccine (5 - 2023-24 season) 09/26/2022   INFLUENZA VACCINE  06/14/2023   Medicare Annual Wellness (AWV)  04/22/2024   DTaP/Tdap/Td (3 - Td or Tdap) 06/02/2032   Hepatitis C Screening  Completed   HIV Screening  Completed   HPV VACCINES  Aged Out    Health Maintenance  Health Maintenance Due  Topic Date Due   COVID-19 Vaccine (5 - 2023-24 season) 09/26/2022    Colorectal cancer screening: Defer to PCP.   Lung Cancer Screening: (Low Dose CT Chest recommended if Age 47-80 years, 30 pack-year currently smoking OR have quit w/in 15years.) does not qualify.   Lung Cancer Screening Referral: Defer to PCP.   Additional Screening:  Hepatitis C Screening: does qualify; Completed 01/11/2021  Vision Screening: Recommended annual ophthalmology exams for early detection of glaucoma and other disorders of the eye. Is the patient up to date with their annual eye exam?  Pt declined answering this question.  Who is the provider or what is the name of the office in which the patient attends annual eye exams? Defer to PCP.  If pt is not established with a provider, would they like to be referred to a provider to establish care? No .   Dental Screening: Recommended annual dental exams for proper oral hygiene  Community Resource Referral / Chronic Care Management: CRR required this visit?  No   CCM required this visit?  No      Plan:     I have personally reviewed and noted the following in the patient's chart:   Medical and social history Use of alcohol, tobacco or illicit drugs  Current medications and supplements including opioid prescriptions. Patient is not currently taking opioid prescriptions. Functional ability and status Nutritional status Physical activity Advanced directives List of other physicians Hospitalizations, surgeries, and ER visits in previous 12 months Vitals Screenings to include cognitive, depression, and  falls Referrals and appointments  In addition, I have reviewed and discussed with patient certain preventive protocols, quality metrics, and best practice recommendations. A written personalized care plan for preventive services as well as general preventive health recommendations were provided to patient.     Hector Venne, CMA   04/23/2023   Nurse Notes: Face to Face.  Mr. Mitcheltree , Thank you for taking time to come for your Medicare Wellness Visit. I appreciate your ongoing commitment to your health goals. Please review the following plan we discussed and let me know if I can assist you in the future.   These are the goals we discussed:  Goals      Weight < 200 lb (90.719 kg)        This is a list of the screening recommended for you and due dates:  Health Maintenance  Topic Date Due   COVID-19 Vaccine (5 - 2023-24 season) 09/26/2022   Flu Shot  06/14/2023   Medicare Annual Wellness Visit  04/22/2024   DTaP/Tdap/Td vaccine (3 - Td or Tdap) 06/02/2032   Hepatitis C Screening  Completed   HIV Screening  Completed   HPV Vaccine  Aged Out

## 2023-04-23 NOTE — Assessment & Plan Note (Signed)
Repeat DEXA while on bisphosphonate therapy showed significant change in L-spine and femur Z-score. I discussed these results with patient's mother following the study and have recommended another drug holiday given risk of side effects and adverse events with prolonged treatment. Will plan to repeat DEXA in 1-2 years for monitoring. Message sent to Dr. Sharl Ma to notify of medication change.  Plan -Off alendronate -DEXA in 1-2 years

## 2023-04-30 ENCOUNTER — Other Ambulatory Visit (INDEPENDENT_AMBULATORY_CARE_PROVIDER_SITE_OTHER): Payer: 59

## 2023-04-30 DIAGNOSIS — E230 Hypopituitarism: Secondary | ICD-10-CM | POA: Diagnosis not present

## 2023-05-02 LAB — BMP8+ANION GAP
Anion Gap: 15 mmol/L (ref 10.0–18.0)
BUN/Creatinine Ratio: 14 (ref 9–20)
BUN: 18 mg/dL (ref 6–24)
CO2: 22 mmol/L (ref 20–29)
Calcium: 9.6 mg/dL (ref 8.7–10.2)
Chloride: 103 mmol/L (ref 96–106)
Creatinine, Ser: 1.25 mg/dL (ref 0.76–1.27)
Glucose: 87 mg/dL (ref 70–99)
Potassium: 4.9 mmol/L (ref 3.5–5.2)
Sodium: 140 mmol/L (ref 134–144)
eGFR: 73 mL/min/{1.73_m2} (ref >59)

## 2023-05-02 NOTE — Progress Notes (Signed)
Stable BMP. Unable to reach patient's mother by telephone 6/19. Will attempt again.

## 2023-05-03 MED ORDER — HYDROCORTISONE 10 MG PO TABS: 5.0000 mg | ORAL_TABLET | Freq: Every morning | ORAL | 5 refills | Status: DC

## 2023-05-03 MED ORDER — POTASSIUM CHLORIDE ER 10 MEQ PO TBCR: 20.0000 meq | EXTENDED_RELEASE_TABLET | Freq: Every day | ORAL | 3 refills | Status: DC

## 2023-05-03 NOTE — Addendum Note (Signed)
Addended by: Dickie La on: 05/03/2023 10:42 AM   Modules accepted: Orders

## 2023-05-08 DIAGNOSIS — E232 Diabetes insipidus: Secondary | ICD-10-CM | POA: Diagnosis not present

## 2023-05-08 DIAGNOSIS — R7303 Prediabetes: Secondary | ICD-10-CM | POA: Diagnosis not present

## 2023-05-08 DIAGNOSIS — E785 Hyperlipidemia, unspecified: Secondary | ICD-10-CM | POA: Diagnosis not present

## 2023-05-08 DIAGNOSIS — E230 Hypopituitarism: Secondary | ICD-10-CM | POA: Diagnosis not present

## 2023-05-16 ENCOUNTER — Other Ambulatory Visit: Payer: Self-pay | Admitting: Internal Medicine

## 2023-05-16 DIAGNOSIS — I10 Essential (primary) hypertension: Secondary | ICD-10-CM

## 2023-05-28 ENCOUNTER — Other Ambulatory Visit: Payer: Self-pay | Admitting: Internal Medicine

## 2023-05-28 DIAGNOSIS — G40909 Epilepsy, unspecified, not intractable, without status epilepticus: Secondary | ICD-10-CM

## 2023-05-28 DIAGNOSIS — F4322 Adjustment disorder with anxiety: Secondary | ICD-10-CM

## 2023-06-06 IMAGING — MR MR HEAD WO/W CM
10 of 13 series · 35 of 48 positions shown · IV contrast (gadavist)
Comparison: 11/12/2007

CLINICAL DATA: Memory loss. History of suprasellar germinoma
treated in 9776

EXAM:
MRI HEAD WITHOUT AND WITH CONTRAST
TECHNIQUE: Multiplanar, multiecho pulse sequences of the brain and surrounding
structures were obtained without and with intravenous contrast.
CONTRAST:  10mL GADAVIST GADOBUTROL 1 MMOL/ML IV SOLN

[Series 3: DWI · axial · 3.0mm · 1.09mm/px · z∈[-61,+74]mm · 9 of 94 slices shown (1 of 4)]
[im 1/94]
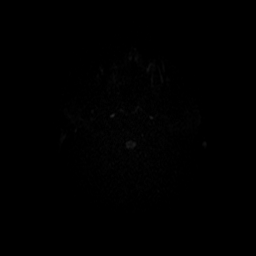
[im 12/94]
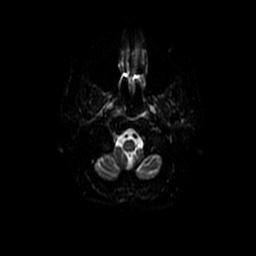
[im 24/94]
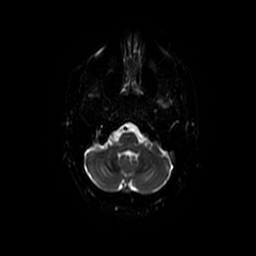
[im 35/94]
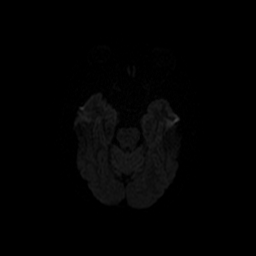
[im 47/94]
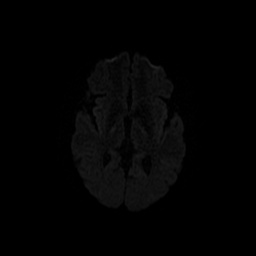
[im 59/94]
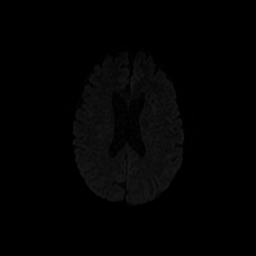
[im 70/94]
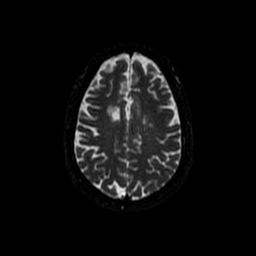
[im 82/94]
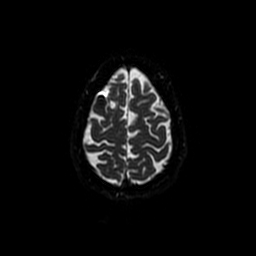
[im 94/94]
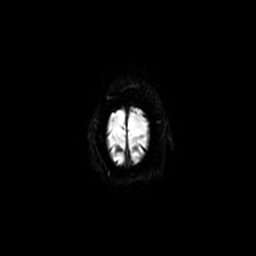

[Series 4: DWI · coronal · 5.0mm · 1.09mm/px · 6 of 74 slices shown (2 of 4)]
[im 1/74]
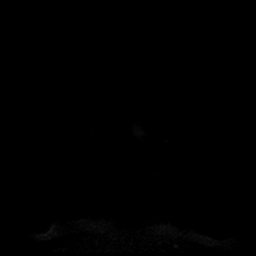
[im 15/74]
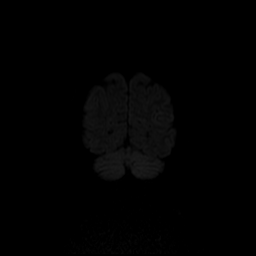
[im 30/74]
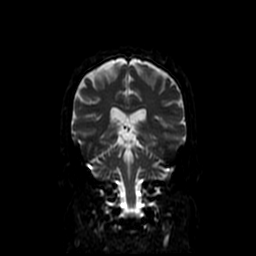
[im 44/74]
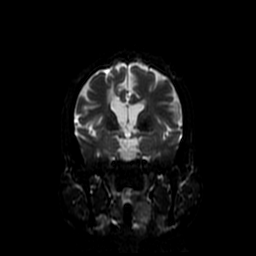
[im 59/74]
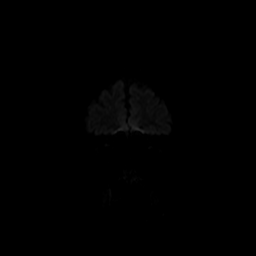
[im 74/74]
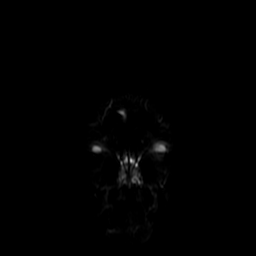

[Series 6: T2 · axial · 5.0mm · 0.43mm/px · z∈[-73,+63]mm · 2 of 24 slices shown]
[im 1/24]
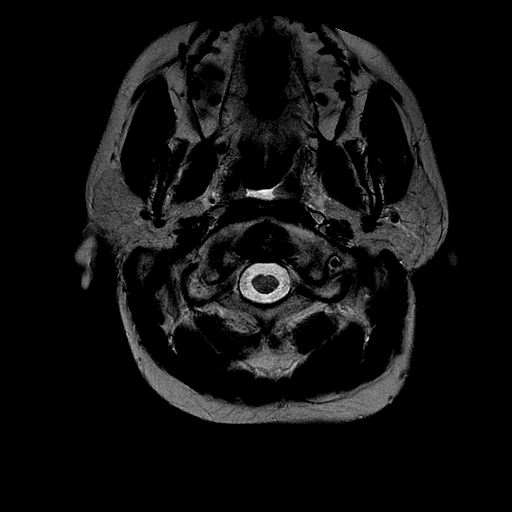
[im 24/24]
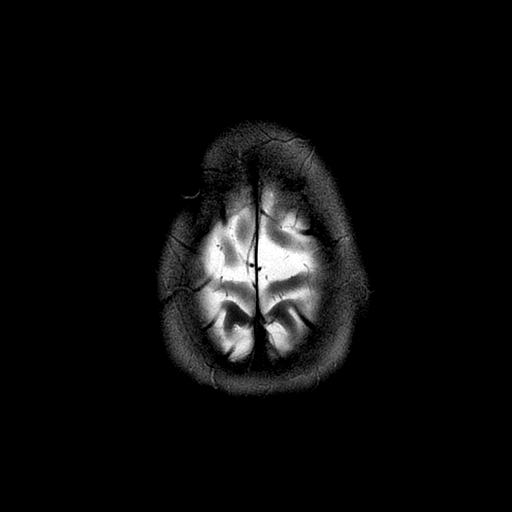

[Series 7: FLAIR · axial · 3.0mm · 0.43mm/px · z∈[-73,+62]mm · 2 of 24 slices shown]
[im 1/24]
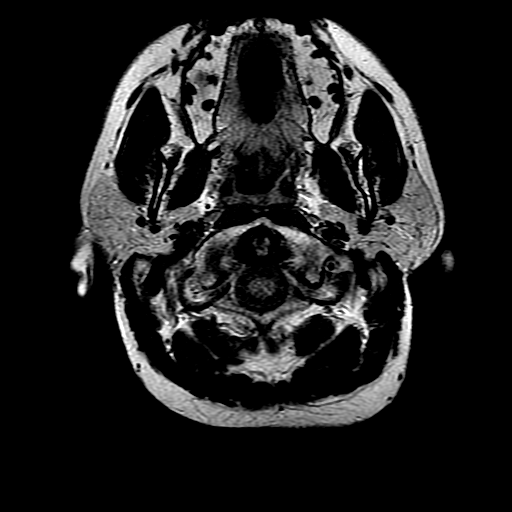
[im 24/24]
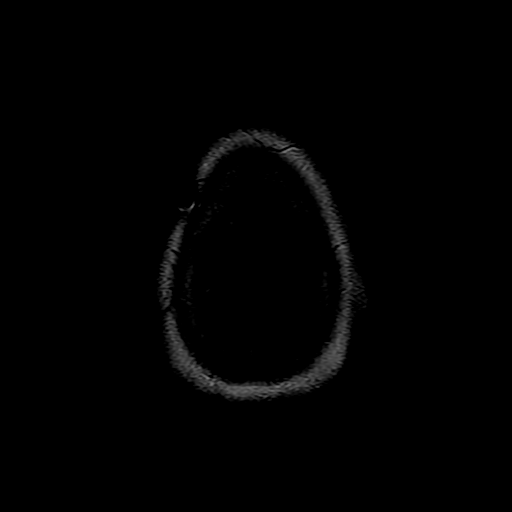

[Series 10: T2 post-contrast · coronal · 5.0mm · 0.39mm/px · 1 of 28 slices shown]
[im 1/28]
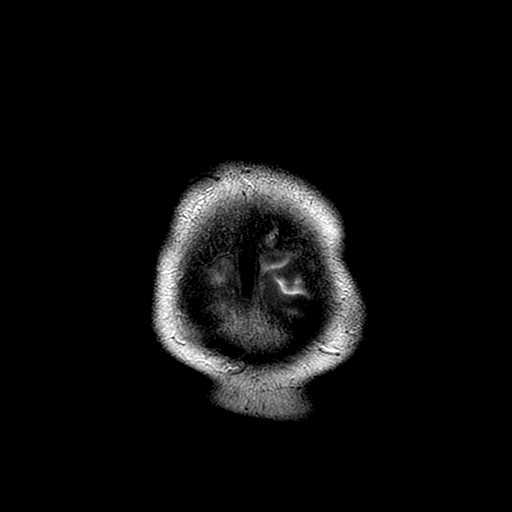

[Series 11: T1 post-contrast · axial · 3.0mm · 0.47mm/px · z∈[-70,+74]mm · 4 of 50 slices shown (1 of 3)]
[im 1/50]
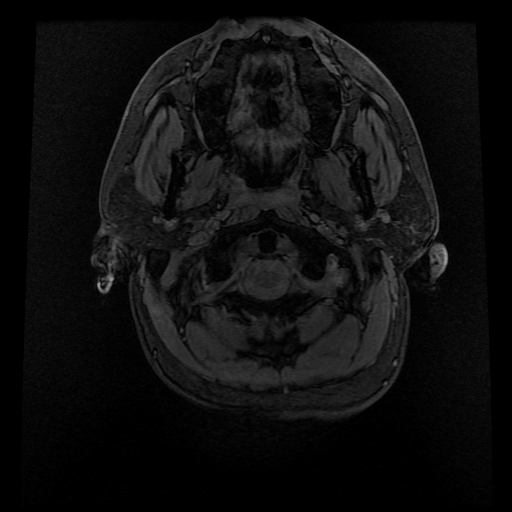
[im 17/50]
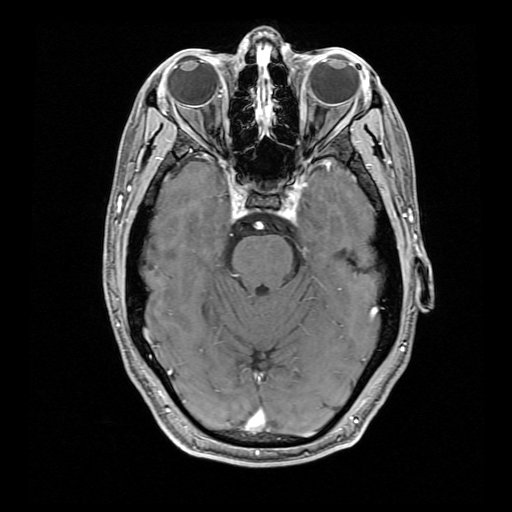
[im 33/50]
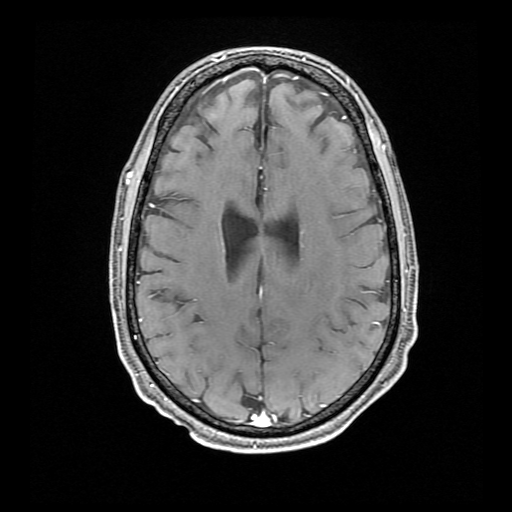
[im 50/50]
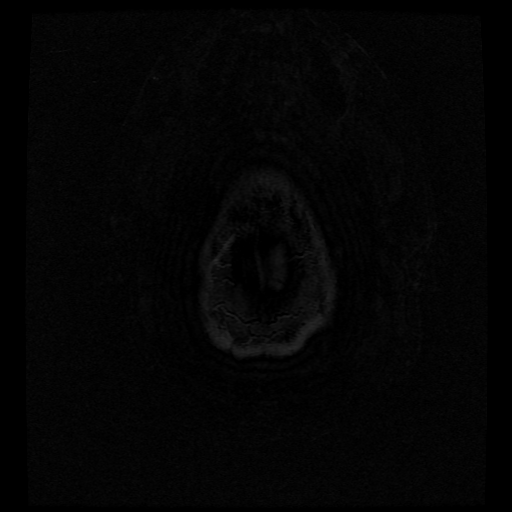

[Series 12: T1 post-contrast · coronal · 5.0mm · 0.39mm/px · 2 of 28 slices shown (2 of 3)]
[im 1/28]
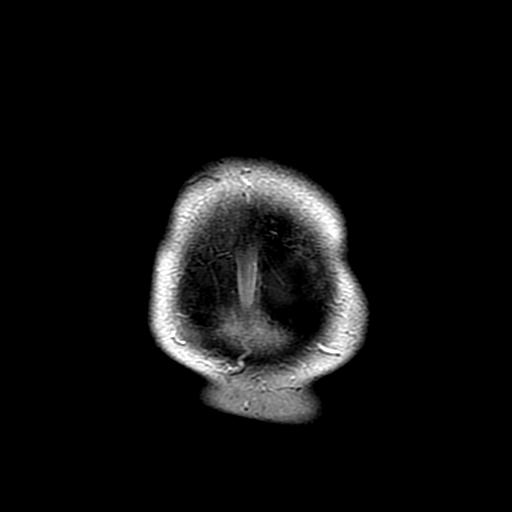
[im 28/28]
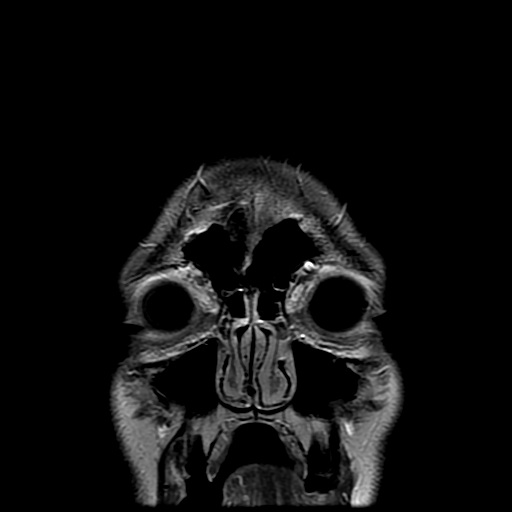

[Series 13: T1 post-contrast · sagittal · 5.0mm · 0.47mm/px · 2 of 23 slices shown (3 of 3)]
[im 1/23]
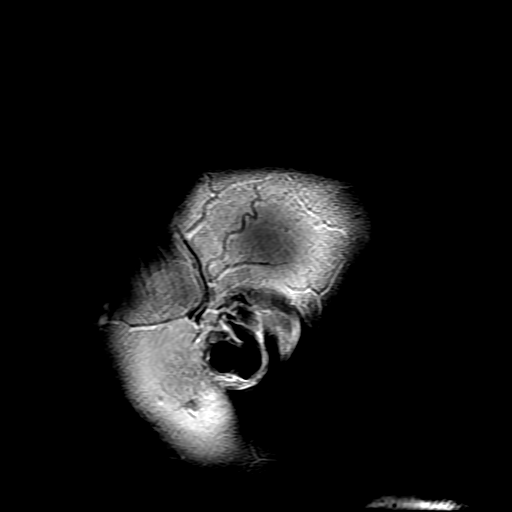
[im 23/23]
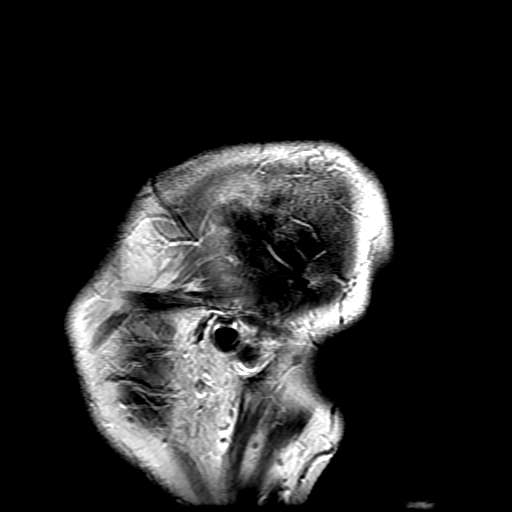

[Series 300: DWI · axial · 3.0mm · 1.09mm/px · z∈[-61,+74]mm · 4 of 47 slices shown (3 of 4)]
[im 1/47]
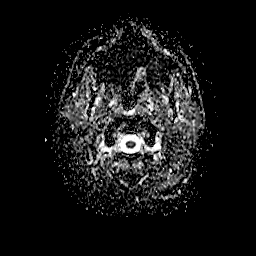
[im 16/47]
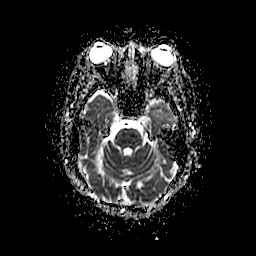
[im 31/47]
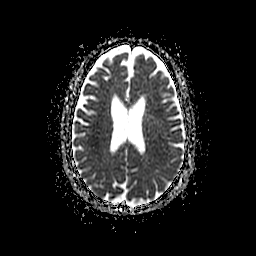
[im 47/47]
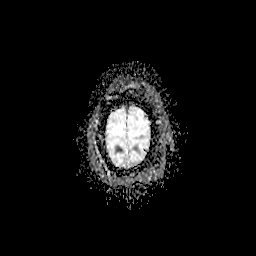

[Series 400: DWI · coronal · 5.0mm · 1.09mm/px · 3 of 37 slices shown (4 of 4)]
[im 1/37]
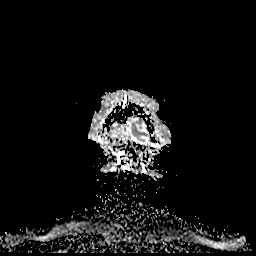
[im 19/37]
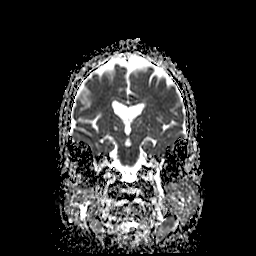
[im 37/37]
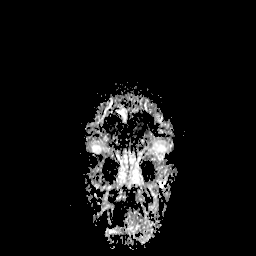

[35 of 48 positions shown; findings below may reference images not displayed]

FINDINGS: Brain: Microangiopathic changes since prior with mild T2
hyperintensity in the deep cerebellum, chronic bilateral thalamic
lacunes, premature mineralization of the deep gray nuclei and in the
subcortical brain. There is history of remote radiation. Stable
ventriculostomy tract through the right hemisphere. No infarct,
hydrocephalus, or collection

Vascular: Normal flow voids and vascular enhancements

Skull and upper cervical spine: Normal marrow signal.

Sinuses/Orbits: Thickened nasal mucosa.  Negative.
IMPRESSION: 1. Microangiopathic changes since [DATE]. No recent insult or reversible finding

## 2023-06-14 ENCOUNTER — Other Ambulatory Visit: Payer: Self-pay | Admitting: Internal Medicine

## 2023-06-14 DIAGNOSIS — E785 Hyperlipidemia, unspecified: Secondary | ICD-10-CM

## 2023-07-16 NOTE — Progress Notes (Unsigned)
Office Visit Note  Patient: Nicholas Holt             Date of Birth: 10/17/1981           MRN: [id number]             PCP: Dickie La, MD Referring: Earl Lagos, MD Visit Date: 07/17/2023 Occupation: @GUAROCC @  Subjective:  No chief complaint on file.   History of Present Illness: Nicholas Holt is a 42 y.o. male here for evaluation of elevated CK levels.***   Labs reviewed 01/2023 CK 438  09/2022 CK 629  ***  -Patient has been noted to have elevated CK levels -Patient did have a work-up for this back in 2011 with Dr. Meredith Pel and [facility] neurology. At that time an EMG/nerve conduction study was done and a decision was made that a muscle biopsy was not needed at that time. Patient's CK at his endocrinology office was elevated to 501. Patient had multiple CK levels between 2009 and 2011 ranging from 500s to 900s. His current CK level remains elevated in the 400s -I do not think that he needs a muscle biopsy at this time given his prior negative work-up and lack of myalgias or worsening weakness.  However, if patient CKs worsen or patient becomes symptomatic would refer for possible muscle biopsy and further work-up   ***   Activities of Daily Living:  Patient reports morning stiffness for *** {minute/hour:19697}.   Patient {ACTIONS;DENIES/REPORTS:21021675::"Denies"} nocturnal pain.  Difficulty dressing/grooming: {ACTIONS;DENIES/REPORTS:21021675::"Denies"} Difficulty climbing stairs: {ACTIONS;DENIES/REPORTS:21021675::"Denies"} Difficulty getting out of chair: {ACTIONS;DENIES/REPORTS:21021675::"Denies"} Difficulty using hands for taps, buttons, cutlery, and/or writing: {ACTIONS;DENIES/REPORTS:21021675::"Denies"}  No Rheumatology ROS completed.   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   Elevated hemidiaphragm 07/09/2015   Perennial allergic rhinitis 07/09/2015   Preventative health care 07/09/2015   H/O malignant neoplasm of brain 09/22/2014   Steroid-induced osteoporosis 03/27/2008   Bilateral leg cramps 11/25/2006   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    H/O malignant neoplasm of brain 09/22/2014   Suprasellar germinoma treated at [facility] in 1997; received chemotherapy (cisplatin, etoposide, vincristine, and cyclophosphomide) and radiation therapy.  Complicated by left sided motor tic disorder and panhypopituitarism.    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  Includes central diabetes insipidus, secondary hypothyroidism, secondary adrenal insufficiency, and secondary hypogonadism.    Perennial allergic rhinitis 07/09/2015   Pre-diabetes  no meds   Seizure disorder (HCC) 08/08/2006   Predated brain tumor.  Generalized seizure ocurred after running out of lorazepam post-tumor.     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 Treated with alendronate from 2009-2016. DEXA (03/16/2008): L Spine T -2.0. L fem neck T -0.9, R fem neck T -1.9.  DEXA (10/15/2014): L Spine T -1.3, L fem neck T -0.6, R fem neck T -1.8.  Bisphosphonate holiday 06/2015.  Repeat DEXA scan in 10/2016 to reassess.     Family History  Problem Relation Age of Onset   Hypertension Mother    Hypertension Father    Hyperlipidemia Father    Prostate cancer Father    Diabetes type I Sister        Died of complications of diabetes in her 69's   Past Surgical History:  Procedure Laterality Date   BRAIN BIOPSY  11/14/1995   CSF SHUNT  11/14/1995   PORTACATH PLACEMENT     portacath removal     RADIOLOGY WITH ANESTHESIA N/A 08/30/2021   Procedure: MRI WITH ANESTHESIA BRAIN WITH AND WITHOUT CONTRAST;  Surgeon: Radiologist, Medication, MD;  Location: [facility] OR;  Service: Radiology;  Laterality: N/A;   RADIOLOGY WITH ANESTHESIA N/A 05/25/2022   Procedure: MRI WITH ANESTHESIA OF BRAIN WITH AND WITHOUT CONTRAST;  Surgeon: Radiologist, Medication, MD;  Location: [facility] OR;  Service: Radiology;  Laterality: N/A;   Social History   Social History Narrative   Lives with parents.  Parents retired. Father or mother take him to appointments.  Mother sends notes if she can not take him.   Immunization History  Administered Date(s) Administered   Influenza Split 08/10/2011, 10/03/2012, 08/28/2014   Influenza Whole 09/05/2006, 09/04/2007, 09/07/2009, 09/02/2010   Influenza,inj,Quad PF,6+ Mos 09/17/2013, 07/09/2015, 08/19/2019, 08/15/2021   Influenza-Unspecified 06/13/2017   PFIZER(Purple Top)SARS-COV-2 Vaccination 01/11/2020, 02/10/2020, 08/28/2020   Pneumococcal Conjugate-13 11/24/2016   Tdap 08/10/2011, 06/02/2022   Unspecified SARS-COV-2 Vaccination 08/01/2022     Objective: Vital Signs: There were no vitals taken for this visit.   Physical Exam   Musculoskeletal Exam: ***  CDAI  Exam: CDAI Score: -- Patient Global: --; Provider Global: -- Swollen: --; Tender: -- Joint Exam 07/17/2023   No joint exam has been documented for this visit   There is currently no information documented on the homunculus. Go to the Rheumatology activity and complete the homunculus joint exam.  Investigation: No additional findings.  Imaging: No results found.  Recent Labs: Lab Results  Component Value Date   WBC 4.1 06/06/2022   HGB 13.4 06/06/2022   PLT 215 06/06/2022   NA 140 04/30/2023   K 4.9 04/30/2023   CL 103 04/30/2023   CO2 22 04/30/2023   GLUCOSE 87 04/30/2023   BUN 18 04/30/2023   CREATININE 1.25 04/30/2023   BILITOT 0.4 06/06/2022   ALKPHOS 68 06/06/2022   AST 41 06/06/2022   ALT 35 06/06/2022   PROT 7.2 06/06/2022   ALBUMIN 4.3 06/06/2022   CALCIUM 9.6 04/30/2023   GFRAA 92 03/09/2020    Speciality Comments: No specialty comments available.  Procedures:  No procedures performed Allergies: Patient has no active allergies.   Assessment / Plan:     Visit Diagnoses: No diagnosis found.  Orders: No orders of the defined types were placed in this encounter.  No orders of the defined types were placed in this encounter.   Face-to-face time spent with patient was *** minutes. Greater than 50% of time was spent in  counseling and coordination of care.  Follow-Up Instructions: No follow-ups on file.   Fuller Plan, MD  Note - This record has been created using [employer].  Chart creation errors have been sought, but may not always  have been located. Such creation errors do not reflect on  the standard of medical care.

## 2023-07-17 ENCOUNTER — Encounter: Payer: Self-pay | Admitting: Internal Medicine

## 2023-07-17 ENCOUNTER — Ambulatory Visit: Payer: 59 | Attending: Internal Medicine | Admitting: Internal Medicine

## 2023-07-17 VITALS — BP 134/89 | HR 58 | Resp 14 | Ht 68.0 in | Wt 198.0 lb

## 2023-07-17 DIAGNOSIS — T380X5A Adverse effect of glucocorticoids and synthetic analogues, initial encounter: Secondary | ICD-10-CM

## 2023-07-17 DIAGNOSIS — E230 Hypopituitarism: Secondary | ICD-10-CM | POA: Diagnosis not present

## 2023-07-17 DIAGNOSIS — I6381 Other cerebral infarction due to occlusion or stenosis of small artery: Secondary | ICD-10-CM

## 2023-07-17 DIAGNOSIS — R252 Cramp and spasm: Secondary | ICD-10-CM | POA: Diagnosis not present

## 2023-07-17 DIAGNOSIS — R748 Abnormal levels of other serum enzymes: Secondary | ICD-10-CM | POA: Diagnosis not present

## 2023-07-17 DIAGNOSIS — M818 Other osteoporosis without current pathological fracture: Secondary | ICD-10-CM | POA: Diagnosis not present

## 2023-07-26 LAB — MYOSITIS SPECIFIC II ANTIBODIES PANEL
EJ AB: <11 SI (ref <11)
JO-1 AB: <11 SI (ref <11)
MDA-5 AB: <11 SI (ref <11)
MI-2 ALPHA AB: <11 SI (ref <11)
MI-2 BETA AB: <11 SI (ref <11)
NXP-2 AB: <11 SI (ref <11)
OJ AB: <11 SI (ref <11)
PL-12 AB: <11 SI (ref <11)
PL-7 AB: <11 SI (ref <11)
SRP-AB: <11 SI (ref <11)
TIF-1y AB: <11 SI (ref <11)

## 2023-07-26 LAB — HMGCR AB (IGG): HMGCR AB (IGG): <2 CU (ref <20)

## 2023-07-26 LAB — CK: Total CK: 710 U/L — ABNORMAL HIGH (ref 44–196)

## 2023-07-26 LAB — ALDOLASE: Aldolase: 6.2 U/L (ref <=8.1)

## 2023-07-27 ENCOUNTER — Other Ambulatory Visit: Payer: Self-pay | Admitting: Internal Medicine

## 2023-07-27 DIAGNOSIS — G40909 Epilepsy, unspecified, not intractable, without status epilepticus: Secondary | ICD-10-CM

## 2023-07-27 DIAGNOSIS — F4322 Adjustment disorder with anxiety: Secondary | ICD-10-CM

## 2023-08-27 DIAGNOSIS — E119 Type 2 diabetes mellitus without complications: Secondary | ICD-10-CM | POA: Diagnosis not present

## 2023-08-27 LAB — HM DIABETES EYE EXAM

## 2023-09-04 ENCOUNTER — Other Ambulatory Visit: Payer: Self-pay | Admitting: Internal Medicine

## 2023-09-04 DIAGNOSIS — R252 Cramp and spasm: Secondary | ICD-10-CM

## 2023-09-10 ENCOUNTER — Other Ambulatory Visit: Payer: Self-pay

## 2023-09-10 DIAGNOSIS — G40909 Epilepsy, unspecified, not intractable, without status epilepticus: Secondary | ICD-10-CM

## 2023-09-10 DIAGNOSIS — F4322 Adjustment disorder with anxiety: Secondary | ICD-10-CM

## 2023-09-11 MED ORDER — LORAZEPAM 2 MG PO TABS
ORAL_TABLET | ORAL | 0 refills | Status: DC
Start: 2023-09-11 — End: 2023-10-15

## 2023-09-14 ENCOUNTER — Ambulatory Visit (INDEPENDENT_AMBULATORY_CARE_PROVIDER_SITE_OTHER): Payer: 59 | Admitting: Student

## 2023-09-14 ENCOUNTER — Encounter: Payer: Self-pay | Admitting: Student

## 2023-09-14 VITALS — BP 134/77 | HR 76 | Temp 97.8°F | Ht 68.0 in | Wt 198.4 lb

## 2023-09-14 DIAGNOSIS — R531 Weakness: Secondary | ICD-10-CM | POA: Diagnosis not present

## 2023-09-14 DIAGNOSIS — R270 Ataxia, unspecified: Secondary | ICD-10-CM

## 2023-09-14 NOTE — Patient Instructions (Signed)
Thank you so much for coming to the clinic today!   I would recommend continuing the things that make you happy, like bowling and going on walks. I'm glad that you haven't had any falls. I will place a referral for physical therapy to help strengthen your legs.   If you have any questions please feel free to the call the clinic at anytime at [phone]. It was a pleasure seeing you!  Best, Dr. Thomasene Ripple

## 2023-09-15 DIAGNOSIS — R531 Weakness: Secondary | ICD-10-CM | POA: Insufficient documentation

## 2023-09-15 HISTORY — DX: Weakness: R53.1

## 2023-09-15 NOTE — Progress Notes (Signed)
CC: "Not running down stairs anymore"  HPI:  [name] is a 42 y.o. male living with a history stated below and presents today for not running downstairs anymore. Please see problem based assessment and plan for additional details.  Past Medical History:  Diagnosis Date   Anxiety    Attention deficit disorder 08/08/2006   Bilateral leg cramps 11/25/2006   Blood transfusion without reported diagnosis 1997   Chemotherapy associated   Brain cancer (HCC) 1998   Cough 12/08/2015   COVID-19 10/16/2017   Elevated hemidiaphragm 07/09/2015   Left, presumably after portacath placement.    H/O malignant neoplasm of brain 09/22/2014   Suprasellar germinoma treated at [facility] in 1997; received chemotherapy (cisplatin, etoposide, vincristine, and cyclophosphomide) and radiation therapy.  Complicated by left sided motor tic disorder and panhypopituitarism.    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  Includes central diabetes insipidus, secondary hypothyroidism, secondary adrenal insufficiency, and secondary hypogonadism.    Perennial allergic rhinitis 07/09/2015   Pre-diabetes    no meds   Seizure disorder (HCC) 08/08/2006   Predated brain tumor.  Generalized seizure ocurred after running out of lorazepam post-tumor.    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 Treated with alendronate from 2009-2016. DEXA (03/16/2008): L Spine T -2.0. L fem neck T -0.9, R fem neck T -1.9.  DEXA (10/15/2014): L Spine T -1.3, L fem neck T -0.6, R fem neck T -1.8.  Bisphosphonate holiday 06/2015.  Repeat DEXA scan in 10/2016 to reassess.     Current Outpatient Medications on  File Prior to Visit  Medication Sig Dispense Refill   amLODipine (NORVASC) 5 MG tablet Take 1 tablet (5 mg total) by mouth daily. 90 tablet 3   Apoaequorin (PREVAGEN) 10 MG CAPS 1 capsule Orally once a day     ascorbic acid (VITAMIN C) 1000 MG tablet 2 tablets Orally Once a day     aspirin 81 MG chewable tablet Chew 1 tablet (81 mg total) by mouth daily. 90 tablet 3   Blood Pressure Monitor KIT Please take your blood pressure daily in the morning 1 kit 0   Calcium-Vitamin D-Vitamin K [phone] MG-UNT-MCG CHEW as directed Orally     Coenzyme Q10 (COQ10) 100 MG CAPS Take 100 mg by mouth in the morning.     cyclobenzaprine (FLEXERIL) 10 MG tablet Take 1 tablet (10 mg total) by mouth at bedtime as needed for muscle spasms. 90 tablet 0   desmopressin (DDAVP NASAL) 0.01 % solution USE ONE SPRAY IN EACH NOSTRIL FOUR TIMES DAILY (Patient taking differently: Place 10 mcg into the nose in the morning, at noon, in the evening, and at bedtime.) 70 mL 3   hydrocortisone (CORTEF) 10 MG tablet Take 0.5 tablets (5 mg total) by mouth in the morning. (Patient not taking: Reported on 07/17/2023) 30 tablet 5   hydrocortisone (CORTEF) 5 MG tablet 1 tablet in early morning Orally Once a day but double the dose on sick days     levothyroxine (SYNTHROID) 100 MCG tablet Take 100 mcg by mouth daily before breakfast.     LORazepam (ATIVAN) 2 MG tablet TAKE 3 TABLETS(6 MG) BY MOUTH AT BEDTIME 90 tablet  0   losartan (COZAAR) 25 MG tablet TAKE 1 TABLET(25 MG) BY MOUTH DAILY 90 tablet 3   Multiple Vitamin (MULTI VITAMIN) TABS 1 tablet Orally Once a day     potassium chloride (KLOR-CON) 10 MEQ tablet Take 2 tablets (20 mEq total) by mouth daily. 180 tablet 3   rosuvastatin (CRESTOR) 20 MG tablet TAKE 1 TABLET(20 MG) BY MOUTH DAILY 90 tablet 1   Testosterone (ANDROGEL PUMP) 20.25 MG/ACT (1.62%) GEL Place 20.25 mg onto the skin daily. Apply 6 pumps 3 pumps per side) topically to shoulders or upper back once a day (Patient taking  differently: Place 6 Pump onto the skin at bedtime. Apply 6 pumps 3 pumps per side) topically to shoulders or upper back once a day) 75 g 3   No current facility-administered medications on file prior to visit.    Family History  Problem Relation Age of Onset   Hypertension Mother    Hypertension Father    Hyperlipidemia Father    Prostate cancer Father    Diabetes type I Sister        Died of complications of diabetes in her 41's    Social History   Socioeconomic History   Marital status: Single    Spouse name: Not on file   Number of children: Not on file   Years of education: Not on file   Highest education level: Not on file  Occupational History   Not on file  Tobacco Use   Smoking status: Never    Passive exposure: Never   Smokeless tobacco: Never  Vaping Use   Vaping status: Never Used  Substance and Sexual Activity   Alcohol use: No    Alcohol/week: 0.0 standard drinks of alcohol   Drug use: No   Sexual activity: Not on file  Other Topics Concern   Not on file  Social History Narrative   Lives with parents.  Parents retired. Father or mother take him to appointments.  Mother sends notes if she can not take him.   Social Determinants of Health   Financial Resource Strain: Low Risk  (03/17/2022)   Overall Financial Resource Strain (CARDIA)    Difficulty of Paying Living Expenses: Not hard at all  Food Insecurity: Patient Declined (04/23/2023)   Hunger Vital Sign    Worried About Running Out of Food in the Last Year: Patient declined    Ran Out of Food in the Last Year: Patient declined  Transportation Needs: Patient Declined (04/23/2023)   PRAPARE - Administrator, Civil Service (Medical): Patient declined    Lack of Transportation (Non-Medical): Patient declined  Physical Activity: Patient Declined (04/23/2023)   Exercise Vital Sign    Days of Exercise per Week: Patient declined    Minutes of Exercise per Session: Patient declined  Stress:  Patient Declined (04/23/2023)   [employer] of Occupational Health - Occupational Stress Questionnaire    Feeling of Stress : Patient declined  Social Connections: Patient Declined (04/23/2023)   Social Connection and Isolation Panel [NHANES]    Frequency of Communication with Friends and Family: Patient declined    Frequency of Social Gatherings with Friends and Family: Patient declined    Attends Religious Services: Patient declined    Active Member of Clubs or Organizations: Patient declined    Attends Banker Meetings: Patient declined    Marital Status: Patient declined  Intimate Partner Violence: Patient Declined (04/23/2023)   Humiliation, Afraid, Rape, and Kick questionnaire  Fear of Current or Ex-Partner: Patient declined    Emotionally Abused: Patient declined    Physically Abused: Patient declined    Sexually Abused: Patient declined    Review of Systems: ROS negative except for what is noted on the assessment and plan.  Vitals:   09/14/23 0936  BP: 134/77  Pulse: 76  Temp: 97.8 F (36.6 C)  TempSrc: Oral  SpO2: 100%  Weight: 198 lb 6.4 oz (90 kg)  Height: 5\' 8"  (1.727 m)    Physical Exam: Constitutional: well-appearing male  in no acute distress Cardiovascular: regular rate and rhythm, no m/r/g Pulmonary/Chest: normal work of breathing on room air, lungs clear to auscultation bilaterally MSK: normal bulk and tone Neurological: alert & oriented x 3, brace present on left leg, normal gait Skin: warm and dry   Assessment & Plan:   Weakness Pt brought in by his father to discuss a recent change in activity. Father states that patient has instead of running down the stairs like he used to is now walking down the stairs. Pt does have a history of brain cancer which did leave his LUE and LLE weak. His father states that this started around 5-6 months ago. Pt is able to communicate effectively, and states that he is not in any pain, does not feel  tired at all, and just "doesn't feel like running down the stairs anymore". Pt's father also states his son has repeatedly said the same thing.   Since his father has noticed this, he has stopped taking him bowling, which used to be one of his favorite activities. He is still able to ambulate with no concerns. On my physical exam, I could not elicit any pain on palpation of lower extremities bilaterally. Attempts to call mother were unsuccessful.   Overall, I do not think this change in activity is secondary to a MSK cause, and I do not believe pt is feeling depressed or fatigued generally. Recommended father continue taking pt bowling, and resume activities. Will also place a referral to PT to help strengthen lower extremities.   Plan:  - PT referral  Patient discussed with Dr. Lennon Alstrom Harish Bram, M.D. [facility] Internal Medicine, PGY-2 Pager: [phone] Date 09/15/2023 Time 11:14 PM

## 2023-09-15 NOTE — Assessment & Plan Note (Signed)
Pt brought in by his father to discuss a recent change in activity. Father states that patient has instead of running down the stairs like he used to is now walking down the stairs. Pt does have a history of brain cancer which did leave his LUE and LLE weak. His father states that this started around 5-6 months ago. Pt is able to communicate effectively, and states that he is not in any pain, does not feel tired at all, and just "doesn't feel like running down the stairs anymore". Pt's father also states his son has repeatedly said the same thing.   Since his father has noticed this, he has stopped taking him bowling, which used to be one of his favorite activities. He is still able to ambulate with no concerns. On my physical exam, I could not elicit any pain on palpation of lower extremities bilaterally. Attempts to call mother were unsuccessful.   Overall, I do not think this change in activity is secondary to a MSK cause, and I do not believe pt is feeling depressed or fatigued generally. Recommended father continue taking pt bowling, and resume activities. Will also place a referral to PT to help strengthen lower extremities.   Plan:  - PT referral

## 2023-09-17 ENCOUNTER — Telehealth: Payer: Self-pay | Admitting: Student

## 2023-09-17 NOTE — Progress Notes (Signed)
Internal Medicine Clinic Attending  Case discussed with the resident at the time of the visit.  We reviewed the resident's history and exam and pertinent patient test results.  I agree with the assessment, diagnosis, and plan of care documented in the resident's note.  

## 2023-09-17 NOTE — Telephone Encounter (Signed)
Pt/s mother is calling back about the pt's Referral to PT. Pt's mother wants to know if a Speech therapy Referral can be placed in addition to his PT Referral.

## 2023-09-17 NOTE — Addendum Note (Signed)
Addended by: Dickie La on: 09/17/2023 01:05 PM   Modules accepted: Level of Service

## 2023-09-20 ENCOUNTER — Other Ambulatory Visit: Payer: Self-pay | Admitting: Student

## 2023-09-20 DIAGNOSIS — R531 Weakness: Secondary | ICD-10-CM

## 2023-09-20 NOTE — Progress Notes (Signed)
Discussed with patient's mother about speech therapy. She states that he is speeding up when talking, as well as mumbling his words to the point at times they are unintelligible. During my encounter, he was only saying yes or no answers. Will place referral for help regarding his speech. I do think he would benefit from speech therapy due to his already chronic illnesses which may be affecting his confidence to speak, as well as mother stating his words are becoming unintelligible due to his speed.   Plan:  - Speech therapy

## 2023-10-08 ENCOUNTER — Encounter: Payer: Self-pay | Admitting: Speech Pathology

## 2023-10-08 ENCOUNTER — Ambulatory Visit: Payer: 59 | Attending: Internal Medicine | Admitting: Speech Pathology

## 2023-10-08 ENCOUNTER — Other Ambulatory Visit: Payer: Self-pay

## 2023-10-08 DIAGNOSIS — R471 Dysarthria and anarthria: Secondary | ICD-10-CM | POA: Diagnosis not present

## 2023-10-08 DIAGNOSIS — R531 Weakness: Secondary | ICD-10-CM | POA: Insufficient documentation

## 2023-10-08 NOTE — Patient Instructions (Signed)
   Speak with intent - be deliberate and use effort  Volume, syllables, pause between words  Good evening Nicholas Holt! How you doing?  Good afternoon! How you doing?  We used to bowl Saturday mornings  Now I watch dad bowl  I like [employer]  I like air fryer chicken  I watch wrestling on TV on [place]   I watch [employer] news on Friday night

## 2023-10-08 NOTE — Therapy (Signed)
OUTPATIENT SPEECH LANGUAGE PATHOLOGY EVALUATION   Patient Name: Nicholas Holt MRN: [id number] DOB:[birth year], 42 y.o., male Today's Date: 10/08/2023  PCP: Earl Lagos, MD REFERRING PROVIDER: Dickie La, MD  END OF SESSION:  End of Session - 10/08/23 1026     Visit Number 1    Number of Visits 25    Date for SLP Re-Evaluation 12/31/23    SLP Start Time 1015    SLP Stop Time  1100    SLP Time Calculation (min) 45 min    Activity Tolerance Patient tolerated treatment well             Past Medical History:  Diagnosis Date   Anxiety    Attention deficit disorder 08/08/2006   Bilateral leg cramps 11/25/2006   Blood transfusion without reported diagnosis 1997   Chemotherapy associated   Brain cancer (HCC) 1998   Cough 12/08/2015   COVID-19 10/16/2017   Elevated hemidiaphragm 07/09/2015   Left, presumably after portacath placement.    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  Complicated by left sided motor tic disorder and panhypopituitarism.    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  Includes central diabetes insipidus, secondary hypothyroidism, secondary adrenal insufficiency, and secondary hypogonadism.    Perennial allergic rhinitis 07/09/2015   Pre-diabetes    no meds   Seizure disorder (HCC) 08/08/2006   Predated brain tumor.  Generalized seizure ocurred after running out of lorazepam post-tumor.    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 Treated with alendronate from  2009-2016. DEXA (03/16/2008): L Spine T -2.0. L fem neck T -0.9, R fem neck T -1.9.  DEXA (10/15/2014): L Spine T -1.3, L fem neck T -0.6, R fem neck T -1.8.  Bisphosphonate holiday 06/2015.  Repeat DEXA scan in 10/2016 to reassess.    Past Surgical History:  Procedure Laterality Date   BRAIN BIOPSY  11/14/1995   CSF SHUNT  11/14/1995   PORTACATH PLACEMENT     portacath removal     RADIOLOGY WITH ANESTHESIA N/A 08/30/2021   Procedure: MRI WITH ANESTHESIA BRAIN WITH AND WITHOUT CONTRAST;  Surgeon: Radiologist, Medication, MD;  Location: [facility] OR;  Service: Radiology;  Laterality: N/A;   RADIOLOGY WITH ANESTHESIA N/A 05/25/2022   Procedure: MRI WITH ANESTHESIA OF BRAIN WITH AND WITHOUT CONTRAST;  Surgeon: Radiologist, Medication, MD;  Location: [facility] OR;  Service: Radiology;  Laterality: N/A;   Patient Active Problem List   Diagnosis Date Noted   Weakness 09/15/2023   Class 1 obesity with serious comorbidity and body mass index (BMI) of 31.0 to 31.9 in adult 04/23/2023   Prediabetes 10/10/2022   Elevated CK 10/10/2022   OSA (obstructive sleep apnea) 07/06/2022   Hyponatremia 05/30/2022   Multiple lacunar infarcts (HCC) 05/04/2022   Fall 04/13/2022   Hypertension 12/09/2019   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   Elevated hemidiaphragm 07/09/2015   Perennial allergic rhinitis 07/09/2015   Preventative health care 07/09/2015   H/O malignant neoplasm of brain 09/22/2014   Steroid-induced osteoporosis 03/27/2008   Bilateral leg cramps 11/25/2006   Panhypopituitarism (diabetes insipidus/anterior pituitary  deficiency) (HCC) 08/08/2006   Hyperlipidemia 08/08/2006   Attention deficit disorder 08/08/2006   Seizure disorder (HCC) 08/08/2006    ONSET DATE: 09/20/2023 (referral date)  REFERRING DIAG: R53.1 (ICD-10-CM) - Weakness  THERAPY DIAG:  Dysarthria and anarthria - Plan: SLP plan of care cert/re-cert  Rationale for Evaluation and Treatment: Rehabilitation  SUBJECTIVE:    SUBJECTIVE STATEMENT: "He says he's tired of people not knowing what I'm saying" Pt accompanied by: family member - mom, [name]  PERTINENT HISTORY: PERTINENT HISTORY: [name] is a 42 y.o. male with history of suprasellar germinoma at age 54 with LUE/LLE hemiparesis (patient has been cared for by his mother since this time and has had persistent gait difficulties and limitation of left arm ROM), HTN, HLD, anxiety, ADD, chronic ataxia. Mom reports his speech has been affected since his brain cancer, but he never received ST.   PAIN:  Are you having pain? No  FALLS: Has patient fallen in last 6 months?  No  LIVING ENVIRONMENT: Lives with: lives with their family Lives in: House/apartment  PLOF:  Level of assistance: Independent with ADLs Employment: On disability  PATIENT GOALS: Stop have to say it again every time I speak  OBJECTIVE:  Note: Objective measures were completed at Evaluation unless otherwise noted.   COGNITION: Overall cognitive status: History of cognitive impairments - at baseline Areas of impairment:  Attention: Impaired: Alternating, Divided Memory: Impaired: Short term Auditory Visual Awareness: Impaired: Emergent Executive function: Impaired: Problem solving Functional deficits: Forgets steps in task such as getting ready  AUDITORY COMPREHENSION: Overall auditory comprehension: Appears intact YES/NO questions: Appears intact Following directions: Appears intact Conversation: Simple Interfering components: attention and processing speed Effective technique: extra processing time and slowed speech  READING COMPREHENSION: Intact for simple  EXPRESSION: verbal  VERBAL EXPRESSION: Level of generative/spontaneous verbalization: phrase Automatic speech: WNL   MOTOR SPEECH: Overall motor speech: impaired Level of impairment: Phrase Respiration: thoracic breathing Phonation: normal Resonance: WFL Articulation: Impaired:  phrase Intelligibility: Intelligibility reduced Motor planning: Appears intact Motor speech errors: unaware Interfering components:  cognition Effective technique: slow rate, increased vocal intensity, over articulate, pause, and use intent/effort  ORAL MOTOR EXAMINATION: Overall status: WFL Comments:     PATIENT REPORTED OUTCOME MEASURES (PROM): Communication Effectiveness Survey: 11 - 1's and 2's for all (not effective to minimally effective communicating)   TODAY'S TREATMENT:                                                                                                                                         DATE:   10/08/23 (eval day): Initiated training in compensations for dysarthria. 2 syllables words pausing between syllable - after structured practice with 1 word with min A, he required frequent mod A to carryover syllables to be intelligible in phrase/sentence. Generated 8 personally relevant sentences to practice with slow rate, intent and volume - he demonstrated accurate production  with occasional min verbal cues and modeling. Initiated HEP for dysarthria (relevant sentences, 2 syllable words in sentence). He benefits from cues to "speak with effort" and "be deliberate." PATIENT EDUCATION: Education details: See Today's Treatment; See patient instructions; HEP for dysarthria; compensations for dysarthria Person educated: Patient and Parent Education method: Explanation, Demonstration, Verbal cues, and Handouts Education comprehension: returned demonstration, verbal cues required, and needs further education   GOALS: Goals reviewed with patient? No due to time  SHORT TERM GOALS: Target date: 11/05/23  Pt will complete HEP for dysarthria with usual min to mod A Baseline: Goal status: INITIAL  2.  Pt will carryover compensations for dysarthria 15/20 sentences to be intelligible with usual min to mod A Baseline:  Goal status: INITIAL  3.  Pt will carryover  compensations for dysarthria over 3 turns in conversation to be 90% intelligible with usual min to mod A Baseline:  Goal status: INITIAL  4.  Pt will use external aids to complete task of getting dressed and ready, including using lotion with occasional min A from mom Baseline:  Goal status: INITIAL    LONG TERM GOALS: Target date: 12/31/23  Pt will complete HEP for dysarthria with occasional min A Baseline:  Goal status: INITIAL  2.  Pt will carryover compensations for dysarthria to be 90% intelligible 5 turns in conversation with occasional min A Baseline:  Goal status: INITIAL  3.  Pt will ID and attempt self correction of dysarthric speech with usual mod A 3/5x Baseline:  Goal status: INITIAL  4.  Pt will improve score on Communicative Effectiveness Survey by 2 points (filled out by mom initially) Baseline: 11 Goal status: INITIAL  5. Pt and family will use external aids/calendar for schedule and chore management as well as orientation               Goal Status: INITIAL   ASSESSMENT:  CLINICAL IMPRESSION: Patient ,"Ephriam Knuckles" is a 42 y.o. 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 Also presents with moderate cognitive impairment at baseline from brain cancer. He is accompanied by his mother, [name] who is primary historian. .She and Ephriam Knuckles report ongoing frustration with poor intelligibility and frequent need to repeat himself. Ephriam Knuckles can verbalize that he needs to slow his speech, however he is not carrying this over. Volume quite low (62-65dB) which also impedes intelligibility. Today, I judged [name] to be 60% intelligible answering questions. He used to bowl on Saturdays with his father, but now only watches due to fall risk. He perseverated that "I go bowling with my father on Saturdays 9:30 to 11:00 at [employer]." 5x during eval. Mom corrected him twice that he no longer bowls, but watches  his father bowl. Grandfather passage was difficult for him to read, but this did slow his rate and improve intelligibility. Reading averaged 65dB. He forgets steps in getting ready, specifically, mom reports he does not put lotion on which is a required step and she has to do this. Ephriam Knuckles was stimulable for improved intelligibility when repeating sentences with my modeling of volume, effort and mouthing with him. Ephriam Knuckles communicates mainly with mom and dad - limited greetings at the bowling alley and gym. They did not identify anyone else he talks with.  I recommend skilled ST to maximize intelligibility for safety, QOL and to reduce caregiver burden  OBJECTIVE IMPAIRMENTS: include attention, memory, awareness, and dysarthria. These impairments are limiting patient from managing appointments, managing  finances, ADLs/IADLs, and effectively communicating at home and in community. Factors affecting potential to achieve goals and functional outcome are ability to learn/carryover information and previous level of function. Patient will benefit from skilled SLP services to address above impairments and improve overall function.  REHAB POTENTIAL: Fair due to time post onset, limited communicative interactions outside of parents  PLAN:  SLP FREQUENCY: 2x/week  SLP DURATION: 12 weeks  PLANNED INTERVENTIONS: 92522- Speech Eval Sound Prod, Articulate, Phonological, 65784 Treatment of speech (30 or 45 min) , Environmental controls, Cueing hierachy, and Cognitive reorganization    Lamonda Noxon, Radene Journey, CCC-SLP 10/08/2023, 2:05 PM

## 2023-10-12 ENCOUNTER — Other Ambulatory Visit: Payer: Self-pay | Admitting: Internal Medicine

## 2023-10-15 ENCOUNTER — Other Ambulatory Visit: Payer: Self-pay

## 2023-10-15 DIAGNOSIS — F4322 Adjustment disorder with anxiety: Secondary | ICD-10-CM

## 2023-10-15 DIAGNOSIS — G40909 Epilepsy, unspecified, not intractable, without status epilepticus: Secondary | ICD-10-CM

## 2023-10-15 MED ORDER — LORAZEPAM 2 MG PO TABS
ORAL_TABLET | ORAL | 0 refills | Status: DC
Start: 2023-10-15 — End: 2023-11-16

## 2023-10-24 ENCOUNTER — Ambulatory Visit: Payer: 59 | Attending: Internal Medicine | Admitting: Speech Pathology

## 2023-10-24 DIAGNOSIS — R471 Dysarthria and anarthria: Secondary | ICD-10-CM | POA: Diagnosis not present

## 2023-10-24 NOTE — Patient Instructions (Signed)
   Volume is important to be understood  Make each word distinct   For [facility] - repeat each word making each syllable loudly - you mom should hear you in the next room  Then make up a sentence using the word - focus on saying 1 word at a time - again try to be heard in the next room

## 2023-10-24 NOTE — Therapy (Signed)
OUTPATIENT SPEECH LANGUAGE PATHOLOGY TREATMENT   Patient Name: Nicholas Holt MRN: [id number] DOB:[birth year], 42 y.o., male Today's Date: 10/24/2023  PCP: Earl Lagos, MD REFERRING PROVIDER: Dickie La, MD  END OF SESSION:  End of Session - 10/24/23 1602     Visit Number 2    Number of Visits 25    Date for SLP Re-Evaluation 12/31/23    SLP Start Time 1445    SLP Stop Time  1530    SLP Time Calculation (min) 45 min    Activity Tolerance Patient tolerated treatment well            Past Medical History:  Diagnosis Date   Anxiety    Attention deficit disorder 08/08/2006   Bilateral leg cramps 11/25/2006   Blood transfusion without reported diagnosis 1997   Chemotherapy associated   Brain cancer (HCC) 1998   Cough 12/08/2015   COVID-19 10/16/2017   Elevated hemidiaphragm 07/09/2015   Left, presumably after portacath placement.    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  Complicated by left sided motor tic disorder and panhypopituitarism.    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  Includes central diabetes insipidus, secondary hypothyroidism, secondary adrenal insufficiency, and secondary hypogonadism.    Perennial allergic rhinitis 07/09/2015   Pre-diabetes    no meds   Seizure disorder (HCC) 08/08/2006   Predated brain tumor.  Generalized seizure ocurred after running out of lorazepam post-tumor.    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 Treated with alendronate from  2009-2016. DEXA (03/16/2008): L Spine T -2.0. L fem neck T -0.9, R fem neck T -1.9.  DEXA (10/15/2014): L Spine T -1.3, L fem neck T -0.6, R fem neck T -1.8.  Bisphosphonate holiday 06/2015.  Repeat DEXA scan in 10/2016 to reassess.    Past Surgical History:  Procedure Laterality Date   BRAIN BIOPSY  11/14/1995   CSF SHUNT  11/14/1995   PORTACATH PLACEMENT     portacath removal     RADIOLOGY WITH ANESTHESIA N/A 08/30/2021   Procedure: MRI WITH ANESTHESIA BRAIN WITH AND WITHOUT CONTRAST;  Surgeon: Radiologist, Medication, MD;  Location: [facility] OR;  Service: Radiology;  Laterality: N/A;   RADIOLOGY WITH ANESTHESIA N/A 05/25/2022   Procedure: MRI WITH ANESTHESIA OF BRAIN WITH AND WITHOUT CONTRAST;  Surgeon: Radiologist, Medication, MD;  Location: [facility] OR;  Service: Radiology;  Laterality: N/A;   Patient Active Problem List   Diagnosis Date Noted   Weakness 09/15/2023   Class 1 obesity with serious comorbidity and body mass index (BMI) of 31.0 to 31.9 in adult 04/23/2023   Prediabetes 10/10/2022   Elevated CK 10/10/2022   OSA (obstructive sleep apnea) 07/06/2022   Hyponatremia 05/30/2022   Multiple lacunar infarcts (HCC) 05/04/2022   Fall 04/13/2022   Hypertension 12/09/2019   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   Elevated hemidiaphragm 07/09/2015   Perennial allergic rhinitis 07/09/2015   Preventative health care 07/09/2015   H/O malignant neoplasm of brain 09/22/2014   Steroid-induced osteoporosis 03/27/2008   Bilateral leg cramps 11/25/2006   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) (  HCC) 08/08/2006   Hyperlipidemia 08/08/2006   Attention deficit disorder 08/08/2006   Seizure disorder (HCC) 08/08/2006    ONSET DATE: 09/20/2023 (referral date)  REFERRING DIAG: R53.1 (ICD-10-CM) - Weakness  THERAPY DIAG:  Dysarthria and anarthria  Rationale for Evaluation and Treatment: Rehabilitation  SUBJECTIVE:   SUBJECTIVE STATEMENT: "No" to did you have  homework Pt accompanied by: family member - mom, Nicholas Holt  PERTINENT HISTORY: PERTINENT HISTORY: [name] is a 42 y.o. male with history of suprasellar germinoma at age 73 with LUE/LLE hemiparesis (patient has been cared for by his mother since this time and has had persistent gait difficulties and limitation of left arm ROM), HTN, HLD, anxiety, ADD, chronic ataxia. Mom reports his speech has been affected since his brain cancer, but he never received ST.   PAIN:  Are you having pain? No  FALLS: Has patient fallen in last 6 months?  No  LIVING ENVIRONMENT: Lives with: lives with their family Lives in: House/apartment  PLOF:  Level of assistance: Independent with ADLs Employment: On disability  PATIENT GOALS: Stop have to say it again every time I speak  OBJECTIVE:    TODAY'S TREATMENT:                                                                                                                                         DATE:   10/24/23: Fayrene Fearing enters with low volume and rapid rate, unintelligible - Targeted slow rate and over articulation in 3-4 syllable words with occasional min A to average 70dB and make each sound. Sentence generation required frequent mod modeling, gestures and verbal cues for volume. He required frequent mod A to maintain audible volume in simple conversation and slow rate. Provided multi-syllable words for HW.   10/08/23 (eval day): Initiated training in compensations for dysarthria. 2 syllables words pausing between syllable - after structured practice with 1 word with min A, he required frequent mod A to carryover syllables to be intelligible in phrase/sentence. Generated 8 personally relevant sentences to practice with slow rate, intent and volume - he demonstrated accurate production with occasional min verbal cues and modeling. Initiated HEP for dysarthria (relevant sentences, 2 syllable words in sentence). He benefits from cues to "speak with effort" and  "be deliberate." PATIENT EDUCATION: Education details: See Today's Treatment; See patient instructions; HEP for dysarthria; compensations for dysarthria Person educated: Patient and Parent Education method: Explanation, Demonstration, Verbal cues, and Handouts Education comprehension: returned demonstration, verbal cues required, and needs further education   GOALS: Goals reviewed with patient? No due to time  SHORT TERM GOALS: Target date: 11/05/23  Pt will complete HEP for dysarthria with usual min to mod A Baseline: Goal status: INITIAL  2.  Pt will carryover compensations for dysarthria 15/20 sentences to be intelligible with usual min to mod A Baseline:  Goal status: INITIAL  3.  Pt will carryover compensations for  dysarthria over 3 turns in conversation to be 90% intelligible with usual min to mod A Baseline:  Goal status: INITIAL  4.  Pt will use external aids to complete task of getting dressed and ready, including using lotion with occasional min A from mom Baseline:  Goal status: INITIAL    LONG TERM GOALS: Target date: 12/31/23  Pt will complete HEP for dysarthria with occasional min A Baseline:  Goal status: INITIAL  2.  Pt will carryover compensations for dysarthria to be 90% intelligible 5 turns in conversation with occasional min A Baseline:  Goal status: INITIAL  3.  Pt will ID and attempt self correction of dysarthric speech with usual mod A 3/5x Baseline:  Goal status: INITIAL  4.  Pt will improve score on Communicative Effectiveness Survey by 2 points (filled out by mom initially) Baseline: 11 Goal status: INITIAL  5. Pt and family will use external aids/calendar for schedule and chore management as well as orientation               Goal Status: INITIAL   ASSESSMENT:  CLINICAL IMPRESSION: Patient ,"Ephriam Knuckles" is a 42 y.o. 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 Also  presents with moderate cognitive impairment at baseline from brain cancer. He is accompanied by his mother, Nicholas Holt who is primary historian. .She and Ephriam Knuckles report ongoing frustration with poor intelligibility and frequent need to repeat himself. Ephriam Knuckles can verbalize that he needs to slow his speech, however he is not carrying this over. Volume quite low (62-65dB) which also impedes intelligibility. Today, I judged [name] to be 60% intelligible answering questions. He used to bowl on Saturdays with his father, but now only watches due to fall risk. He perseverated that "I go bowling with my father on Saturdays 9:30 to 11:00 at [employer]." 5x during eval. Mom corrected him twice that he no longer bowls, but watches his father bowl. Grandfather passage was difficult for him to read, but this did slow his rate and improve intelligibility. Reading averaged 65dB. He forgets steps in getting ready, specifically, mom reports he does not put lotion on which is a required step and she has to do this. Ephriam Knuckles was stimulable for improved intelligibility when repeating sentences with my modeling of volume, effort and mouthing with him. Ephriam Knuckles communicates mainly with mom and dad - limited greetings at the bowling alley and gym. They did not identify anyone else he talks with.  I recommend skilled ST to maximize intelligibility for safety, QOL and to reduce caregiver burden  OBJECTIVE IMPAIRMENTS: include attention, memory, awareness, and dysarthria. These impairments are limiting patient from managing appointments, managing finances, ADLs/IADLs, and effectively communicating at home and in community. Factors affecting potential to achieve goals and functional outcome are ability to learn/carryover information and previous level of function. Patient will benefit from skilled SLP services to address above impairments and improve overall function.  REHAB POTENTIAL: Fair due to time post onset, limited  communicative interactions outside of parents  PLAN:  SLP FREQUENCY: 2x/week  SLP DURATION: 12 weeks  PLANNED INTERVENTIONS: 92522- Speech Eval Sound Prod, Articulate, Phonological, 40981 Treatment of speech (30 or 45 min) , Environmental controls, Cueing hierachy, and Cognitive reorganization    Tahjay Binion, Radene Journey, CCC-SLP 10/24/2023, 4:02 PM

## 2023-10-29 ENCOUNTER — Ambulatory Visit: Payer: 59

## 2023-10-29 DIAGNOSIS — R471 Dysarthria and anarthria: Secondary | ICD-10-CM | POA: Diagnosis not present

## 2023-10-29 NOTE — Patient Instructions (Signed)
You will have to frequently practice speaking slower and louder. Read out loud 2-3x/day.   Homework: (use your note card to guide you/slow you down) Read the sentences below out loud.  Read your wrestling magazine articles out loud    During my sophomore year of high school, I was diagnosed with brain cancer. I was cured with chemo therapy and radiation. I had 28 days of chemo therapy and 25 days of radiation. I worked for [employer] my junior year and worked for [employer] my senior year.   My dad and I used to go bowl at [employer] every Saturday from 10:30 to 11:30.  We go to Bible Study every Wednesday.

## 2023-10-29 NOTE — Therapy (Signed)
OUTPATIENT SPEECH LANGUAGE PATHOLOGY TREATMENT   Patient Name: Nicholas Holt MRN: [id number] DOB:[birth year], 42 y.o., male Today's Date: 10/29/2023  PCP: Earl Lagos, MD REFERRING PROVIDER: Dickie La, MD   End of Session - 10/29/23 1447     Visit Number 3    Number of Visits 25    Date for SLP Re-Evaluation 12/31/23    SLP Start Time 1355    SLP Stop Time  1440    SLP Time Calculation (min) 45 min    Activity Tolerance Patient tolerated treatment well              Past Medical History:  Diagnosis Date   Anxiety    Attention deficit disorder 08/08/2006   Bilateral leg cramps 11/25/2006   Blood transfusion without reported diagnosis 1997   Chemotherapy associated   Brain cancer (HCC) 1998   Cough 12/08/2015   COVID-19 10/16/2017   Elevated hemidiaphragm 07/09/2015   Left, presumably after portacath placement.    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  Complicated by left sided motor tic disorder and panhypopituitarism.    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  Includes central diabetes insipidus, secondary hypothyroidism, secondary adrenal insufficiency, and secondary hypogonadism.    Perennial allergic rhinitis 07/09/2015   Pre-diabetes    no meds   Seizure disorder (HCC) 08/08/2006   Predated brain tumor.  Generalized seizure ocurred after running out of lorazepam post-tumor.    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 Treated with alendronate from 2009-2016. DEXA  (03/16/2008): L Spine T -2.0. L fem neck T -0.9, R fem neck T -1.9.  DEXA (10/15/2014): L Spine T -1.3, L fem neck T -0.6, R fem neck T -1.8.  Bisphosphonate holiday 06/2015.  Repeat DEXA scan in 10/2016 to reassess.    Past Surgical History:  Procedure Laterality Date   BRAIN BIOPSY  11/14/1995   CSF SHUNT  11/14/1995   PORTACATH PLACEMENT     portacath removal     RADIOLOGY WITH ANESTHESIA N/A 08/30/2021   Procedure: MRI WITH ANESTHESIA BRAIN WITH AND WITHOUT CONTRAST;  Surgeon: Radiologist, Medication, MD;  Location: [facility] OR;  Service: Radiology;  Laterality: N/A;   RADIOLOGY WITH ANESTHESIA N/A 05/25/2022   Procedure: MRI WITH ANESTHESIA OF BRAIN WITH AND WITHOUT CONTRAST;  Surgeon: Radiologist, Medication, MD;  Location: [facility] OR;  Service: Radiology;  Laterality: N/A;   Patient Active Problem List   Diagnosis Date Noted   Weakness 09/15/2023   Class 1 obesity with serious comorbidity and body mass index (BMI) of 31.0 to 31.9 in adult 04/23/2023   Prediabetes 10/10/2022   Elevated CK 10/10/2022   OSA (obstructive sleep apnea) 07/06/2022   Hyponatremia 05/30/2022   Multiple lacunar infarcts (HCC) 05/04/2022   Fall 04/13/2022   Hypertension 12/09/2019   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   Elevated hemidiaphragm 07/09/2015   Perennial allergic rhinitis 07/09/2015   Preventative health care 07/09/2015   H/O malignant neoplasm of brain 09/22/2014   Steroid-induced osteoporosis 03/27/2008   Bilateral leg cramps 11/25/2006   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 male with history of suprasellar germinoma at age 30 with LUE/LLE hemiparesis (patient has been cared for by his mother since this time and has had persistent gait difficulties and limitation of left arm ROM), HTN, HLD, anxiety, ADD, chronic ataxia. Mom reports his speech has been affected since his brain cancer, but he never received ST.   PAIN: Are you having pain? No  FALLS: Has patient fallen in last 6 months?  No  LIVING ENVIRONMENT: Lives with: lives with their family Lives in: House/apartment  PLOF:  Level of assistance: Independent with ADLs Employment: On disability  PATIENT GOALS: Stop have to say it again every time I speak  OBJECTIVE:    TODAY'S TREATMENT:                                                                                                                                         10/29/23: Nicholas Holt enters with fast rate and imprecise articulation. Rated 20% intelligible to unfamiliar listener. Perseverated on same topics repeatedly. Targeted common utterances to aid carryover. Occasional mod A required to slow rate while reading and usual max A required to slow rate while conversing. With cues to repeat and slow down at discourse level, minimal difference exhibited. Modeling was most beneficial. Updated HEP (see pt instructions).   10/24/23: Nicholas Holt enters with low volume and rapid rate, unintelligible - Targeted slow rate and over articulation in 3-4 syllable words with occasional min A to average 70dB and make each sound. Sentence generation required frequent mod modeling, gestures and verbal cues for volume. He required frequent mod A to maintain audible volume in simple conversation and slow rate. Provided multi-syllable words for HW.   10/08/23 (eval day): Initiated training in compensations for dysarthria. 2 syllables words pausing between syllable - after structured practice with 1 word with  min A, he required frequent mod A to carryover syllables to be intelligible in phrase/sentence. Generated 8 personally relevant sentences to practice with slow rate, intent and volume - he demonstrated accurate production with occasional min verbal cues and modeling. Initiated HEP for dysarthria (relevant sentences, 2 syllable words in sentence). He benefits from cues to "speak with effort" and "be deliberate." PATIENT EDUCATION: Education details: See Today's Treatment; See patient instructions; HEP for dysarthria; compensations for dysarthria Person educated: Patient and Parent Education method: Explanation, Demonstration, Verbal cues, and Handouts Education comprehension: returned demonstration, verbal cues required, and needs further education   GOALS: Goals reviewed with patient? No due to time  SHORT TERM GOALS: Target date: 11/05/23  Pt will complete HEP for dysarthria with usual min to mod A Baseline: Goal status: IN PROGRESS  2.  Pt will carryover compensations for dysarthria 15/20 sentences to be intelligible with usual min to mod A Baseline:  Goal status: IN PROGRESS  3.  Pt will carryover compensations for dysarthria over 3 turns in conversation to be 90% intelligible with usual min to mod A Baseline:  Goal status: IN PROGRESS  4.  Pt will use external aids to complete task of getting dressed and ready, including using lotion with occasional min A from mom Baseline:  Goal status:IN PROGRESS    LONG TERM GOALS: Target date: 12/31/23  Pt will complete HEP for dysarthria with occasional min A Baseline:  Goal status: IN PROGRESS  2.  Pt will carryover compensations for dysarthria to be 90% intelligible 5 turns in conversation with occasional min A Baseline:  Goal status: IN PROGRESS  3.  Pt will ID and attempt self correction of dysarthric speech with usual mod A 3/5x Baseline:  Goal status: IN PROGRESS  4.  Pt will improve score on Communicative Effectiveness  Survey by 2 points (filled out by mom initially) Baseline: 11 Goal status: IN PROGRESS  5. Pt and family will use external aids/calendar for schedule and chore management as well as orientation               Goal Status: IN PROGRESS   ASSESSMENT:  CLINICAL IMPRESSION: Patient ,"Nicholas Holt" is a 42 y.o. 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 Also presents with moderate cognitive impairment at baseline from brain cancer. Conducted ongoing education and instruction of dysarthria strategies with ability to demonstrate during structured task. I recommend skilled ST to maximize intelligibility for safety, QOL and to reduce caregiver burden  OBJECTIVE IMPAIRMENTS: include attention, memory, awareness, and dysarthria. These impairments are limiting patient from managing appointments, managing finances, ADLs/IADLs, and effectively communicating at home and in community. Factors affecting potential to achieve goals and functional outcome are ability to learn/carryover information and previous level of function. Patient will benefit from skilled SLP services to address above impairments and improve overall function.  REHAB POTENTIAL: Fair due to time post onset, limited communicative interactions outside of parents  PLAN:  SLP FREQUENCY: 2x/week  SLP DURATION: 12 weeks  PLANNED INTERVENTIONS: 92522- Speech Eval Sound Prod, Articulate, Phonological, 53664 Treatment of speech (30 or 45 min) , Environmental controls, Cueing hierachy, and Cognitive reorganization    Gracy Racer, CCC-SLP 10/29/2023, 2:58 PM

## 2023-10-30 DIAGNOSIS — E785 Hyperlipidemia, unspecified: Secondary | ICD-10-CM | POA: Diagnosis not present

## 2023-10-30 DIAGNOSIS — E230 Hypopituitarism: Secondary | ICD-10-CM | POA: Diagnosis not present

## 2023-10-30 DIAGNOSIS — R7303 Prediabetes: Secondary | ICD-10-CM | POA: Diagnosis not present

## 2023-10-30 DIAGNOSIS — E232 Diabetes insipidus: Secondary | ICD-10-CM | POA: Diagnosis not present

## 2023-10-31 ENCOUNTER — Encounter: Payer: 59 | Admitting: Speech Pathology

## 2023-11-15 ENCOUNTER — Other Ambulatory Visit: Payer: Self-pay | Admitting: Internal Medicine

## 2023-11-15 DIAGNOSIS — G40909 Epilepsy, unspecified, not intractable, without status epilepticus: Secondary | ICD-10-CM

## 2023-11-15 DIAGNOSIS — F4322 Adjustment disorder with anxiety: Secondary | ICD-10-CM

## 2023-11-19 ENCOUNTER — Ambulatory Visit: Payer: 59 | Admitting: Speech Pathology

## 2023-12-05 ENCOUNTER — Other Ambulatory Visit: Payer: Self-pay | Admitting: Internal Medicine

## 2023-12-05 DIAGNOSIS — E785 Hyperlipidemia, unspecified: Secondary | ICD-10-CM

## 2023-12-06 NOTE — Telephone Encounter (Signed)
Medication sent to pharmacy  

## 2023-12-13 ENCOUNTER — Other Ambulatory Visit: Payer: Self-pay | Admitting: Internal Medicine

## 2023-12-13 DIAGNOSIS — G40909 Epilepsy, unspecified, not intractable, without status epilepticus: Secondary | ICD-10-CM

## 2023-12-13 DIAGNOSIS — F4322 Adjustment disorder with anxiety: Secondary | ICD-10-CM

## 2024-01-08 ENCOUNTER — Ambulatory Visit (INDEPENDENT_AMBULATORY_CARE_PROVIDER_SITE_OTHER): Payer: 59 | Admitting: Internal Medicine

## 2024-01-08 ENCOUNTER — Encounter: Payer: Self-pay | Admitting: Internal Medicine

## 2024-01-08 VITALS — BP 137/85 | HR 70 | Temp 97.4°F | Ht 68.0 in | Wt 202.7 lb

## 2024-01-08 DIAGNOSIS — G253 Myoclonus: Secondary | ICD-10-CM

## 2024-01-08 DIAGNOSIS — I6381 Other cerebral infarction due to occlusion or stenosis of small artery: Secondary | ICD-10-CM

## 2024-01-08 DIAGNOSIS — Z85841 Personal history of malignant neoplasm of brain: Secondary | ICD-10-CM

## 2024-01-08 DIAGNOSIS — E785 Hyperlipidemia, unspecified: Secondary | ICD-10-CM | POA: Diagnosis not present

## 2024-01-08 DIAGNOSIS — R748 Abnormal levels of other serum enzymes: Secondary | ICD-10-CM

## 2024-01-08 DIAGNOSIS — I10 Essential (primary) hypertension: Secondary | ICD-10-CM

## 2024-01-08 DIAGNOSIS — G40909 Epilepsy, unspecified, not intractable, without status epilepticus: Secondary | ICD-10-CM

## 2024-01-08 DIAGNOSIS — R252 Cramp and spasm: Secondary | ICD-10-CM | POA: Diagnosis not present

## 2024-01-08 DIAGNOSIS — R7303 Prediabetes: Secondary | ICD-10-CM

## 2024-01-08 DIAGNOSIS — G4701 Insomnia due to medical condition: Secondary | ICD-10-CM | POA: Diagnosis not present

## 2024-01-08 DIAGNOSIS — E230 Hypopituitarism: Secondary | ICD-10-CM | POA: Diagnosis not present

## 2024-01-08 LAB — POCT GLYCOSYLATED HEMOGLOBIN (HGB A1C): Hemoglobin A1C: 6 % — AB (ref 4.0–5.6)

## 2024-01-08 LAB — BASIC METABOLIC PANEL
Anion gap: 7 (ref 5–15)
BUN: 20 mg/dL (ref 6–20)
CO2: 26 mmol/L (ref 22–32)
Calcium: 9.1 mg/dL (ref 8.9–10.3)
Chloride: 118 mmol/L — ABNORMAL HIGH (ref 98–111)
Creatinine, Ser: 1.16 mg/dL (ref 0.61–1.24)
GFR, Estimated: >60 mL/min (ref >60)
Glucose, Bld: 75 mg/dL (ref 70–99)
Potassium: 4.1 mmol/L (ref 3.5–5.1)
Sodium: 151 mmol/L — ABNORMAL HIGH (ref 135–145)

## 2024-01-08 LAB — GLUCOSE, CAPILLARY: Glucose-Capillary: 73 mg/dL (ref 70–99)

## 2024-01-08 MED ORDER — MELATONIN 3 MG PO TABS
3.0000 mg | ORAL_TABLET | Freq: Every day | ORAL | 3 refills | Status: AC
Start: 2024-01-08 — End: ?

## 2024-01-08 NOTE — Assessment & Plan Note (Signed)
 Continuing to have muscle spasms at least a few times each week. Currently taking cyclobenzaprine 5 mg nightly. Review of rheumatology visit, noting muscle relaxant induced toxicity/rhabdo very unlikely given chronicity of CK elevation. Low suspicion for inflammatory myositis,  HMGCR Ab negative.  Plan -Continue cyclobenzaprine 5 mg nightly prn, will discuss potential increase or alternative therapy with patient's mom  Addendum 2/26 Reviewed notes from rheumatology and discussed with patient's mother. Plan to continue cyclobenzaprine 5 mg nightly prn for now. If spasms worsen, could discuss increase or alternative.

## 2024-01-08 NOTE — Assessment & Plan Note (Addendum)
 Follows with Dr. Sharl Ma, endocrinology.Continues on hydrocortisone, desmopressin, testosterone gel, and levothyroxine.   BMP today with Na 151, likely related to underlying DI. On DDAVP per endocrinology. I have discussed with Dr. Sharl Ma via Epic chat and he will call the patient to address. I have also called patient's mother to inform her of this finding and f/u with endocrinology.  Plan -BMP  Addendum 2/26 Spoke with patient's mother this morning to check in. Dr. Daune Perch office spoke with them on 2/25, advised increased water intake and plan for repeat labs on 2/27. Ms. Njie asked if they could decrease DDAVP or switch to oral option. I have advised her to continue current dose until she speaks with Dr. Sharl Ma tomorrow.

## 2024-01-08 NOTE — Assessment & Plan Note (Signed)
 Blood pressure stable at recent visits. Now on losartan alone. Continue current regimen.  Plan -Continue losartan 25 mg daily -BMP

## 2024-01-08 NOTE — Progress Notes (Signed)
 m  Established Patient Office Visit  Subjective   Patient ID: Nicholas Holt, male    DOB: [birth year]  Age: 43 y.o. MRN: [id number]  Chief Complaint  Patient presents with   routine checkup    Diabetes    Mr. Uselman returns to clinic today for f/u of chronic conditions. He is accompanied by his mother. Please see assessment/plan in problem-based charting for further details of today's visit.     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) 08/08/2006   Hyperlipidemia 08/08/2006   Attention deficit disorder 08/08/2006   Seizure disorder (HCC) 08/08/2006      Objective:     BP 137/85 (BP Location: Right Arm, Patient Position: Sitting, Cuff Size: Normal)   Pulse 70   Temp (!) 97.4 F (36.3 C) (Oral)   Ht 5\' 8"  (1.727 m)   Wt 202 lb 11.2 oz (91.9 kg)   SpO2 100%   BMI 30.82 kg/m  BP Readings from Last 3 Encounters:  01/08/24 137/85  09/14/23 134/77  07/17/23 134/89   Wt Readings from Last 3 Encounters:  01/08/24 202 lb 11.2 oz (91.9 kg)  09/14/23 198 lb 6.4 oz (90 kg)  07/17/23 198 lb (89.8 kg)      Physical Exam Constitutional:      General: He is not in acute distress.    Appearance: Normal appearance. He is not ill-appearing or toxic-appearing.  Neck:     Comments: No thyromegaly or nodularity Cardiovascular:     Rate and Rhythm: Normal rate and regular  rhythm.     Heart sounds: Normal heart sounds.  Pulmonary:     Effort: Pulmonary effort is normal.     Breath sounds: Normal breath sounds.  Skin:    General: Skin is warm.  Neurological:     Mental Status: He is alert. Mental status is at baseline.  Psychiatric:        Mood and Affect: Mood normal.        Behavior: Behavior normal.      Assessment & Plan:   Problem List Items Addressed This Visit       Cardiovascular and Mediastinum   Hypertension - Primary   Blood pressure stable at recent visits. Now on losartan alone. Continue current regimen.  Plan -Continue losartan 25 mg daily -BMP      Relevant Orders   BMP w Anion Gap (STAT/Sunquest-performed on-site) (Completed)     Endocrine   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) (HCC) (Chronic)   Follows with Dr. Sharl Ma, endocrinology.Continues on hydrocortisone, desmopressin, testosterone gel, and levothyroxine.   BMP today with Na 151, likely related to underlying DI. On DDAVP per endocrinology. I have discussed with Dr. Sharl Ma via Epic chat and he will call the patient to address. I have also called patient's mother to inform her of this finding and f/u with endocrinology.  Plan -BMP  Addendum 2/26 Spoke with patient's mother this morning to check in. Dr.  Kerr's office spoke with them on 2/25, advised increased water intake and plan for repeat labs on 2/27. Ms. Yepes asked if they could decrease DDAVP or switch to oral option. I have advised her to continue current dose until she speaks with Dr. Sharl Ma tomorrow.          Nervous and Auditory   Seizure disorder (HCC) (Chronic)   Stable on lorazepam 6 mg nightly. No recent seizures. Continue current dose.      H/O malignant neoplasm of brain (Chronic)   Patient's mother has noted issues with speech since treatment for brain cancer in 1997. Previously referred to speech therapy but was only able to attend a few sessions before scheduling  conflicts. Patient's mother wishes to resume therapy, additional order placed for referral today.      Relevant Orders   Ambulatory referral to Speech Therapy   Multiple lacunar infarcts (HCC)   Stable. Continues on aspirin and high-intensity statin. No changes today.        Other   Hyperlipidemia (Chronic)   Continue rosuvastatin for history of lacunar infarcts. Recheck lipids at next visit.  Plan -Rosuvastatin 20 mg daily -Lipid next visit      Bilateral leg cramps (Chronic)   Continuing to have muscle spasms at least a few times each week. Currently taking cyclobenzaprine 5 mg nightly. Review of rheumatology visit, noting muscle relaxant induced toxicity/rhabdo very unlikely given chronicity of CK elevation. Low suspicion for inflammatory myositis,  HMGCR Ab negative.  Plan -Continue cyclobenzaprine 5 mg nightly prn, will discuss potential increase or alternative therapy with patient's mom  Addendum 2/26 Reviewed notes from rheumatology and discussed with patient's mother. Plan to continue cyclobenzaprine 5 mg nightly prn for now. If spasms worsen, could discuss increase or alternative.      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 She notes Timouthy goes to bed around 11pm and will stay awake for an hour or more before falling asleep. Previous concern for myoclonus as cause of poor sleep and early awakening, although sounds like sleep onset is the most bothersome. Keiandre notes he does continue to have muscle spasms several times/week. Continues on lorazepam 6 mg nightly for history of seizure and cyclobenzaprine 5 mg nightly prn for cramping.We discussed strategies to avoid screen time prior to bed at last visit which they have started.   Plan -Continue current medications -Continue screen time limits and other sleep hygiene prior to sleep -Start melatonin 2-3 hours prior to bed. May take Ativan at the same time. -Referral for sleep  study to evaluate OSA at prior visit, will need to schedule  Addendum 2/26 Need for repeat sleep study discussed with patient's mother. Phone number for scheduling provided.      Relevant Medications   melatonin 3 MG TABS tablet   Prediabetes   History of prediabetes. A1c remains at 6.0%. Continue healthy diet and exercise.        Relevant Orders   POC Hbg A1C (Completed)   Glucose, capillary (Completed)   Elevated CK   Long-standing history of elevated CK without clear etiology. Not symptomatic. Not thought to be secondary to statin use and statin needed for history of lacunar infarcts. Seen by rheumatology in 07/2023, thought potentially secondary to fluctuations in cortisol/thyroid levels. Negative myositis panel and HMGCR Ab. Unlikely related to muscle relaxant due to chronicity.  Plan -Monitor intermittently         Return in about 3 months (around 04/06/2024).  Dickie La, MD

## 2024-01-08 NOTE — Assessment & Plan Note (Signed)
 Stable. Continues on aspirin and high-intensity statin. No changes today.

## 2024-01-08 NOTE — Assessment & Plan Note (Signed)
 Continue rosuvastatin for history of lacunar infarcts. Recheck lipids at next visit.  Plan -Rosuvastatin 20 mg daily -Lipid next visit

## 2024-01-08 NOTE — Assessment & Plan Note (Signed)
 Patient's mother has noted issues with speech since treatment for brain cancer in 1997. Previously referred to speech therapy but was only able to attend a few sessions before scheduling conflicts. Patient's mother wishes to resume therapy, additional order placed for referral today.

## 2024-01-08 NOTE — Assessment & Plan Note (Addendum)
 Long-standing history of elevated CK without clear etiology. Not symptomatic. Not thought to be secondary to statin use and statin needed for history of lacunar infarcts. Seen by rheumatology in 07/2023, thought potentially secondary to fluctuations in cortisol/thyroid levels. Negative myositis panel and HMGCR Ab. Unlikely related to muscle relaxant due to chronicity.  Plan -Monitor intermittently

## 2024-01-08 NOTE — Assessment & Plan Note (Signed)
 History of prediabetes. A1c remains at 6.0%. Continue healthy diet and exercise.

## 2024-01-08 NOTE — Assessment & Plan Note (Signed)
 Stable on lorazepam 6 mg nightly. No recent seizures. Continue current dose.

## 2024-01-08 NOTE — Assessment & Plan Note (Signed)
 Patient's mother with concerns regarding sleep. She notes Bronislaw goes to bed around 11pm and will stay awake for an hour or more before falling asleep. Previous concern for myoclonus as cause of poor sleep and early awakening, although sounds like sleep onset is the most bothersome. Chaim notes he does continue to have muscle spasms several times/week. Continues on lorazepam 6 mg nightly for history of seizure and cyclobenzaprine 5 mg nightly prn for cramping.We discussed strategies to avoid screen time prior to bed at last visit which they have started.   Plan -Continue current medications -Continue screen time limits and other sleep hygiene prior to sleep -Start melatonin 2-3 hours prior to bed. May take Ativan at the same time. -Referral for sleep study to evaluate OSA at prior visit, will need to schedule  Addendum 2/26 Need for repeat sleep study discussed with patient's mother. Phone number for scheduling provided.

## 2024-01-08 NOTE — Patient Instructions (Signed)
 It was wonderful to see you today!  Please start taking Ativan and melatonin two hours before bedtime.  I have sent another referral to speech therapy.  I will call you with lab results!

## 2024-01-09 ENCOUNTER — Telehealth: Payer: Self-pay | Admitting: Internal Medicine

## 2024-01-09 DIAGNOSIS — G253 Myoclonus: Secondary | ICD-10-CM

## 2024-01-09 NOTE — Progress Notes (Signed)
 Reviewed with patient's mother, Nocomus Crisco, on 2/25 and 2/26. Appt with Dr. Sharl Ma on 2/27 for repeat sodium.

## 2024-01-09 NOTE — Telephone Encounter (Signed)
 Rec'd a call from the pt's mother who state she contacted [facility] as instructed.  They will need a New Referral as the current one will expire in April for his Sleep Study Consult request.   Can a new Referral be placed?

## 2024-01-09 NOTE — Progress Notes (Signed)
 Reviewed with patient's mother, Nicholas Holt, on 2/25.

## 2024-01-10 DIAGNOSIS — E870 Hyperosmolality and hypernatremia: Secondary | ICD-10-CM | POA: Diagnosis not present

## 2024-01-13 ENCOUNTER — Other Ambulatory Visit: Payer: Self-pay | Admitting: Internal Medicine

## 2024-01-13 DIAGNOSIS — I10 Essential (primary) hypertension: Secondary | ICD-10-CM

## 2024-01-14 NOTE — Telephone Encounter (Signed)
 Medication sent to pharmacy

## 2024-01-17 DIAGNOSIS — E870 Hyperosmolality and hypernatremia: Secondary | ICD-10-CM | POA: Diagnosis not present

## 2024-01-27 ENCOUNTER — Other Ambulatory Visit: Payer: Self-pay | Admitting: Internal Medicine

## 2024-01-27 DIAGNOSIS — F4322 Adjustment disorder with anxiety: Secondary | ICD-10-CM

## 2024-01-27 DIAGNOSIS — G40909 Epilepsy, unspecified, not intractable, without status epilepticus: Secondary | ICD-10-CM

## 2024-02-01 ENCOUNTER — Ambulatory Visit: Payer: 59 | Admitting: Speech Pathology

## 2024-02-14 DIAGNOSIS — E230 Hypopituitarism: Secondary | ICD-10-CM | POA: Diagnosis not present

## 2024-02-14 DIAGNOSIS — E232 Diabetes insipidus: Secondary | ICD-10-CM | POA: Diagnosis not present

## 2024-02-14 DIAGNOSIS — R7303 Prediabetes: Secondary | ICD-10-CM | POA: Diagnosis not present

## 2024-02-20 DIAGNOSIS — E785 Hyperlipidemia, unspecified: Secondary | ICD-10-CM | POA: Diagnosis not present

## 2024-02-20 DIAGNOSIS — E232 Diabetes insipidus: Secondary | ICD-10-CM | POA: Diagnosis not present

## 2024-02-20 DIAGNOSIS — R7303 Prediabetes: Secondary | ICD-10-CM | POA: Diagnosis not present

## 2024-02-20 DIAGNOSIS — E230 Hypopituitarism: Secondary | ICD-10-CM | POA: Diagnosis not present

## 2024-02-26 ENCOUNTER — Other Ambulatory Visit: Payer: Self-pay | Admitting: Internal Medicine

## 2024-02-26 DIAGNOSIS — F4322 Adjustment disorder with anxiety: Secondary | ICD-10-CM

## 2024-02-26 DIAGNOSIS — G40909 Epilepsy, unspecified, not intractable, without status epilepticus: Secondary | ICD-10-CM

## 2024-02-26 NOTE — Telephone Encounter (Addendum)
 Unable to send medication to the pharmacy, rx keeps reverting back to print instead of normal.

## 2024-02-27 MED ORDER — LORAZEPAM 2 MG PO TABS: ORAL_TABLET | ORAL | 0 refills | Status: DC

## 2024-02-27 NOTE — Addendum Note (Signed)
 Addended by: Bevelyn Bryant on: 02/27/2024 03:36 PM   Modules accepted: Orders

## 2024-02-28 DIAGNOSIS — N281 Cyst of kidney, acquired: Secondary | ICD-10-CM | POA: Diagnosis not present

## 2024-02-28 DIAGNOSIS — M810 Age-related osteoporosis without current pathological fracture: Secondary | ICD-10-CM | POA: Diagnosis not present

## 2024-02-28 DIAGNOSIS — E232 Diabetes insipidus: Secondary | ICD-10-CM | POA: Diagnosis not present

## 2024-02-28 DIAGNOSIS — E871 Hypo-osmolality and hyponatremia: Secondary | ICD-10-CM | POA: Diagnosis not present

## 2024-02-28 DIAGNOSIS — E039 Hypothyroidism, unspecified: Secondary | ICD-10-CM | POA: Diagnosis not present

## 2024-02-28 DIAGNOSIS — N182 Chronic kidney disease, stage 2 (mild): Secondary | ICD-10-CM | POA: Diagnosis not present

## 2024-03-04 NOTE — Therapy (Deleted)
 OUTPATIENT SPEECH LANGUAGE PATHOLOGY MOTOR SPEECH EVALUATION   Patient Name: Nicholas Holt MRN: [id number] DOB:06/12/1981, 43 y.o., male Today's Date: 03/04/2024  PCP: Ancil Balzarine. Carlon Chester, MD REFERRING PROVIDER: PCP  END OF SESSION:   Past Medical History:  Diagnosis Date   Anxiety    Attention deficit disorder 08/08/2006   Bilateral leg cramps 11/25/2006   Blood transfusion without reported diagnosis 1997   Chemotherapy associated   Brain cancer (HCC) 1998   Cough 12/08/2015   COVID-19 10/16/2017   Elevated hemidiaphragm 07/09/2015   Left, presumably after portacath placement.    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  Complicated by left sided motor tic disorder and panhypopituitarism.    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  Includes central diabetes insipidus, secondary hypothyroidism, secondary adrenal insufficiency, and secondary hypogonadism.    Perennial allergic rhinitis 07/09/2015   Pre-diabetes    no meds   Preventative health care 07/09/2015   Seizure disorder (HCC) 08/08/2006   Predated brain tumor.  Generalized seizure ocurred after running out of lorazepam  post-tumor.    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 Treated with alendronate  from 2009-2016. DEXA (03/16/2008): L Spine T -2.0. L fem neck T -0.9, R fem neck T -1.9.  DEXA (10/15/2014): L Spine T -1.3, L fem neck T -0.6, R fem neck T -1.8.  Bisphosphonate holiday 06/2015.  Repeat  DEXA scan in 10/2016 to reassess.    Weakness 09/15/2023   Past Surgical History:  Procedure Laterality Date   BRAIN BIOPSY  11/14/1995   CSF SHUNT  11/14/1995   PORTACATH PLACEMENT     portacath removal     RADIOLOGY WITH ANESTHESIA N/A 08/30/2021   Procedure: MRI WITH ANESTHESIA BRAIN WITH AND WITHOUT CONTRAST;  Surgeon: Radiologist, Medication, MD;  Location: [facility] OR;  Service: Radiology;  Laterality: N/A;   RADIOLOGY WITH ANESTHESIA N/A 05/25/2022   Procedure: MRI WITH ANESTHESIA OF BRAIN WITH AND WITHOUT CONTRAST;  Surgeon: Radiologist, Medication, MD;  Location: [facility] OR;  Service: Radiology;  Laterality: N/A;   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  Rationale for Evaluation and Treatment: Rehabilitation  SUBJECTIVE:   SUBJECTIVE STATEMENT: *** Pt accompanied by: {accompnied:27141}  PERTINENT HISTORY: Nicholas Holt is  a 43 y.o. male with history of suprasellar germinoma at age 47 with LUE/LLE hemiparesis (patient has been cared for by his mother since this time and has had persistent gait difficulties and  limitation of left arm ROM), HTN, HLD, anxiety, ADD, chronic ataxia. Mom reports his speech has been affected since his brain cancer, but he never received ST.   PAIN:  Are you having pain? No  FALLS: Has patient fallen in last 6 months?  No  LIVING ENVIRONMENT: Lives with: lives with their family Lives in: House/apartment  PLOF:  Level of assistance: Independent with ADLs Employment: On disability  PATIENT GOALS: ***  OBJECTIVE:  Note: Objective measures were completed at Evaluation unless otherwise noted.  COGNITION: Overall cognitive status: {cognition:24006} Areas of impairment: {cognitive impairment:24009} Comments: ***  MOTOR SPEECH: assessed across variety of speech tasks: reading, word repetition, generative discourse sample Overall motor speech: {slpimpaired:27210} Level of impairment: {SLP level of impairment:25441} Rate of Speech: {slprateofspeech:28668} Dysfluencies: {SLP dysfluncies:28669} Phonation: {SLP phonation:25439}  Sustained phonation duration: *** seconds Oral reading loudness average: *** dB Conversational loudness average: *** dB Voice Quality: {VQL:27192}  S/ Respiration: {respbreathing:27195} Word and Phrasal Stress: {SLP stress:28670} Resonance: {SLP resonance:25440} Articulation: {SLParticulation:27218} Diadochokinetic Rate (DDK): {SLP UVO:53664} Intelligibility: {SLP Intelligible:25442} Motor planning: {slpmotorspeecherrors:27220} Interfering components: {SLP Interfering components (MS):25444} Effective technique: {SLP effective technique (MS):25445}  Stimulability trials: Given SLP modeling and {frequency:26928} {level:26929} cues, pt demonstrates improved intelligibility using *** speech strategies at (loud "ah", word, sentence, paragraph, conversation) level.  Comments: ***  ORAL MOTOR EXAMINATION: Overall status: {OMESLP2:27645} Comments: ***   PATIENT REPORTED OUTCOME MEASURES (PROM): Communication Effectiveness Survey         How effective is your speech... Pt Rating  Having a conversation with a family member or friends at home {eat 10 scores:29482}  Participating in conversation with strangers in a quiet place  {eat 10 scores:29482}  Conversing with a familiar person over the telephone {eat 10 scores:29482}  Conversing with a stranger over the telephone {eat 10 scores:29482}  Being part of a conversation in a noisy environment  {eat 10 scores:29482}  Speaking to a friend when you are emotionally upset or angry {eat 10 scores:29482}  Having a conversation while traveling in the car {eat 10 scores:29482}  Having a conversation with someone at a distance {eat 10 scores:29482}        Total:  ***  1= not at all effective                                                                     4= very effective  TREATMENT DATE:  03/04/2024: ***  PATIENT EDUCATION: Education details: *** Person educated: {Person educated:25204} Education method: {Education Method:25205} Education comprehension: {Education Comprehension:25206}  HOME EXERCISE PROGRAM: ***   GOALS: Goals reviewed with patient? Yes  SHORT TERM GOALS: Target date: ***  *** Baseline: Goal status: INITIAL  2.  *** Baseline:  Goal status: INITIAL  3.  *** Baseline:  Goal status: INITIAL  4.  *** Baseline:  Goal status: INITIAL  5.  *** Baseline:  Goal status: INITIAL  6.  *** Baseline:  Goal status: INITIAL  LONG TERM GOALS: Target date: ***  *** Baseline:  Goal status: INITIAL  2.  *** Baseline:  Goal status: INITIAL  3.  *** Baseline:  Goal status: INITIAL  4.  *** Baseline:  Goal status: INITIAL  5.  *** Baseline:  Goal status: INITIAL  6.  *** Baseline:  Goal status: INITIAL  ASSESSMENT:  CLINICAL IMPRESSION: Patient is a *** y.o. *** who was seen today for ***.   OBJECTIVE  IMPAIRMENTS: Objective impairments include {SLPOBJIMP:27107}. These impairments are limiting patient from {SLPLIMIT:27108}.Factors affecting potential to achieve goals and functional outcome are {SLP factors:25450}.. Patient will benefit from skilled SLP services to address above impairments and improve overall function.  REHAB POTENTIAL: {rehabpotential:25112}  PLAN:  SLP FREQUENCY: {rehab frequency:25116}  SLP DURATION: {rehab duration:25117}  PLANNED INTERVENTIONS: {SLP treatment/interventions:25449}   Alston Jerry, CCC-SLP 03/04/2024, 1:15 PM

## 2024-03-07 ENCOUNTER — Ambulatory Visit: Admitting: Speech Pathology

## 2024-03-24 NOTE — Telephone Encounter (Signed)
 Spoke with the patient's mother. Appt has been sch for 03/31/2024.  Copied from CRM [id number]. Topic: Referral - Request for Referral >> Mar 21, 2024 11:30 AM Arlie Benedict B wrote: Did the patient discuss referral with their provider in the last year? No (If No - schedule appointment) (If Yes - send message)  Appointment offered? Yes; patient  I advised mother of patient if she had not spoken with the provider as of yet she would need to schedule an appt. She stated she didn't want to schedule the apt she wanted to speak with the provider and if the provider states she needs to make an appt with her then she would do so.   Type of order/referral and detailed reason for visit: For physical therapy   Preference of office, provider, location: [facility] [place] [address] Alana Hoyle [place], [place] [address] Phone: [phone]   If referral order, have you been seen by this specialty before? No  Can we respond through MyChart? No

## 2024-03-31 ENCOUNTER — Ambulatory Visit (INDEPENDENT_AMBULATORY_CARE_PROVIDER_SITE_OTHER): Admitting: Internal Medicine

## 2024-03-31 VITALS — BP 134/83 | HR 66 | Temp 97.8°F | Ht 68.0 in | Wt 199.6 lb

## 2024-03-31 DIAGNOSIS — R29898 Other symptoms and signs involving the musculoskeletal system: Secondary | ICD-10-CM

## 2024-03-31 DIAGNOSIS — Z85841 Personal history of malignant neoplasm of brain: Secondary | ICD-10-CM

## 2024-03-31 DIAGNOSIS — R260 Ataxic gait: Secondary | ICD-10-CM

## 2024-03-31 DIAGNOSIS — I10 Essential (primary) hypertension: Secondary | ICD-10-CM

## 2024-03-31 DIAGNOSIS — E785 Hyperlipidemia, unspecified: Secondary | ICD-10-CM | POA: Diagnosis not present

## 2024-03-31 NOTE — Progress Notes (Signed)
 CC: PT referral  HPI:  [name] is a 43 y.o. with medical history of HTN, HLD, prediabetes, panhypopituitarism, lacunar infarcts presenting to [facility] for 3 month follow up. Pt needs PT referral.   Please see problem-based list for further details, assessments, and plans.  Past Medical History:  Diagnosis Date   Anxiety    Attention deficit disorder 08/08/2006   Bilateral leg cramps 11/25/2006   Blood transfusion without reported diagnosis 1997   Chemotherapy associated   Brain cancer (HCC) 1998   Cough 12/08/2015   COVID-19 10/16/2017   Elevated hemidiaphragm 07/09/2015   Left, presumably after portacath placement.    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  Complicated by left sided motor tic disorder and panhypopituitarism.    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  Includes central diabetes insipidus, secondary hypothyroidism, secondary adrenal insufficiency, and secondary hypogonadism.    Perennial allergic rhinitis 07/09/2015   Pre-diabetes    no meds   Preventative health care 07/09/2015   Seizure disorder (HCC) 08/08/2006   Predated brain tumor.  Generalized seizure ocurred after running out of lorazepam  post-tumor.    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 Treated with alendronate  from 2009-2016. DEXA (03/16/2008): L Spine T -2.0. L fem neck T -0.9, R fem neck T -1.9.  DEXA (10/15/2014): L Spine T -1.3, L fem neck T -0.6, R fem  neck T -1.8.  Bisphosphonate holiday 06/2015.  Repeat DEXA scan in 10/2016 to reassess.    Weakness 09/15/2023    Current Outpatient Medications (Endocrine & Metabolic):    desmopressin  (DDAVP  NASAL) 0.01 % solution, USE ONE SPRAY IN EACH NOSTRIL FOUR TIMES DAILY (Patient taking differently: Place 10 mcg into the nose in the morning, at noon, in the evening, and at bedtime.)   hydrocortisone  (CORTEF ) 5 MG tablet, 1 tablet in early morning Orally Once a day but double the dose on sick days   levothyroxine  (SYNTHROID ) 100 MCG tablet, Take 100 mcg by mouth daily before breakfast.   Testosterone  (ANDROGEL  PUMP) 20.25 MG/ACT (1.62%) GEL, Place 20.25 mg onto the skin daily. Apply 6 pumps 3 pumps per side) topically to shoulders or upper back once a day (Patient taking differently: Place 6 Pump onto the skin at bedtime. Apply 6 pumps 3 pumps per side) topically to shoulders or upper back once a day)  Current Outpatient Medications (Cardiovascular):    losartan  (COZAAR ) 25 MG tablet, TAKE 1 TABLET(25 MG) BY MOUTH DAILY   rosuvastatin  (CRESTOR ) 20 MG tablet, TAKE 1 TABLET(20 MG) BY MOUTH DAILY   Current Outpatient Medications (Analgesics):    aspirin  81 MG chewable tablet, CHEW AND SWALLOW 1 TABLET(81 MG) BY MOUTH DAILY   Current Outpatient Medications (Other):    Apoaequorin (PREVAGEN) 10 MG CAPS, 1 capsule Orally once a day   ascorbic acid (VITAMIN C) 1000 MG tablet, 2 tablets Orally Once a day   LORazepam  (ATIVAN ) 2 MG tablet, TAKE 3 TABLETS(6 MG) BY MOUTH AT BEDTIME   melatonin 3 MG TABS tablet, Take 1 tablet (3  mg total) by mouth at bedtime.   Multiple Vitamin (MULTI VITAMIN) TABS, 1 tablet Orally Once a day   potassium chloride  (KLOR-CON ) 10 MEQ tablet, Take 2 tablets (20 mEq total) by mouth daily.  Review of Systems:  Review of system negative unless stated in the problem list or HPI.    Physical Exam:  Vitals:   03/31/24 1437  BP: 134/83  Pulse: 66  Temp: 97.8 F (36.6 C)  TempSrc:  Oral  SpO2: 97%  Weight: 199 lb 9.6 oz (90.5 kg)  Height: 5\' 8"  (1.727 m)   Physical Exam General: NAD HENT: NCAT Lungs: CTAB, no wheeze, rhonchi or rales.  Cardiovascular: Normal heart sounds, no r/m/g, 2+ pulses in all extremities. No LE edema Abdomen: No TTP, normal bowel sounds MSK: right arm with contracture that is chronic.  Skin: no lesions noted on exposed skin Neuro: Alert and oriented x4. CN grossly intact. Left lower extremity with 4/5 strength, normal sensation, ataxic gait Psych: Normal mood and normal affect   Assessment & Plan:   Hypertension Patient with hypertension that is well-controlled on losartan  25 mg daily.  Last renal panel showed normal renal function with some hyponatremia that resolved when rechecked at the endocrinology office.  Continue taking losartan  25 mg daily.  Patient has close follow-up with PCP.  H/O malignant neoplasm of brain Patient's mother brought him to the clinic to see if he can get a referral to physical therapy.  She herself is getting physical therapy at [facility].  Patient examined at show he will benefit from physical therapy including some weakness in the left lower extremity and ataxic gait.  Patient has been wearing brace for the past 2 years.  Referral placed.  Hyperlipidemia Plan was to perform lipid panel this visit but patient's mother stated she has a follow-up with PCP coming up and would like to get this done during that follow-up appointment.   See Encounters Tab for problem based charting.  Patient Discussed with Dr. Versa Gore, MD Tommas Fragmin. [facility] Internal Medicine Residency, PGY-3

## 2024-03-31 NOTE — Patient Instructions (Addendum)
 [name], it was a pleasure seeing you today! You endorsed feeling well today. Below are some of the things we talked about this visit. We look forward to seeing you in the follow up appointment!  Today we discussed: I provided you with a PT referral. Please follow up with your PCP as planned.   Continue taking your current medications.   I have ordered the following labs today:  Lab Orders  No laboratory test(s) ordered today      Referrals ordered today:    Referral Orders         Ambulatory referral to Physical Therapy      I have ordered the following medication/changed the following medications:   Stop the following medications: Medications Discontinued During This Encounter  Medication Reason   Calcium -Vitamin D -Vitamin K [phone] MG-UNT-MCG CHEW Patient has not taken in last 30 days   Coenzyme Q10 (COQ10) 100 MG CAPS Patient has not taken in last 30 days     Start the following medications: No orders of the defined types were placed in this encounter.    Follow-up: follow up with PCP   Please make sure to arrive 15 minutes prior to your next appointment. If you arrive late, you may be asked to reschedule.   We look forward to seeing you next time. Please call our clinic at [phone] if you have any questions or concerns. The best time to call is Monday-Friday from 9am-4pm, but there is someone available 24/7. If after hours or the weekend, call the main hospital number and ask for the Internal Medicine Resident On-Call. If you need medication refills, please notify your pharmacy one week in advance and they will send us  a request.  Thank you for letting us  take part in your care. Wishing you the best!  Thank you, Jackolyn Masker, MD

## 2024-04-01 NOTE — Assessment & Plan Note (Signed)
 Patient's mother brought him to the clinic to see if he can get a referral to physical therapy.  She herself is getting physical therapy at [facility].  Patient examined at show he will benefit from physical therapy including some weakness in the left lower extremity and ataxic gait.  Patient has been wearing brace for the past 2 years.  Referral placed.

## 2024-04-01 NOTE — Assessment & Plan Note (Signed)
 Plan was to perform lipid panel this visit but patient's mother stated she has a follow-up with PCP coming up and would like to get this done during that follow-up appointment.

## 2024-04-01 NOTE — Assessment & Plan Note (Addendum)
 Patient with hypertension that is well-controlled on losartan  25 mg daily.  Last renal panel showed normal renal function with some hyponatremia that resolved when rechecked at the endocrinology office.  Continue taking losartan  25 mg daily.  Patient has close follow-up with PCP.

## 2024-04-01 NOTE — Progress Notes (Signed)
 Internal Medicine Clinic Attending  Case discussed with the resident at the time of the visit.  We reviewed the resident's history and exam and pertinent patient test results.  I agree with the assessment, diagnosis, and plan of care documented in the resident's note.

## 2024-04-08 ENCOUNTER — Other Ambulatory Visit: Payer: Self-pay | Admitting: Internal Medicine

## 2024-04-08 DIAGNOSIS — R2689 Other abnormalities of gait and mobility: Secondary | ICD-10-CM | POA: Diagnosis not present

## 2024-04-08 DIAGNOSIS — F4322 Adjustment disorder with anxiety: Secondary | ICD-10-CM

## 2024-04-08 DIAGNOSIS — G40909 Epilepsy, unspecified, not intractable, without status epilepticus: Secondary | ICD-10-CM

## 2024-04-15 ENCOUNTER — Ambulatory Visit (INDEPENDENT_AMBULATORY_CARE_PROVIDER_SITE_OTHER): Payer: Self-pay | Admitting: Internal Medicine

## 2024-04-15 VITALS — BP 133/72 | HR 65 | Temp 97.6°F | Ht 68.0 in | Wt 199.8 lb

## 2024-04-15 DIAGNOSIS — T380X5A Adverse effect of glucocorticoids and synthetic analogues, initial encounter: Secondary | ICD-10-CM

## 2024-04-15 DIAGNOSIS — M818 Other osteoporosis without current pathological fracture: Secondary | ICD-10-CM | POA: Diagnosis not present

## 2024-04-15 DIAGNOSIS — Z85841 Personal history of malignant neoplasm of brain: Secondary | ICD-10-CM | POA: Diagnosis not present

## 2024-04-15 DIAGNOSIS — E230 Hypopituitarism: Secondary | ICD-10-CM

## 2024-04-15 DIAGNOSIS — R748 Abnormal levels of other serum enzymes: Secondary | ICD-10-CM

## 2024-04-15 DIAGNOSIS — E785 Hyperlipidemia, unspecified: Secondary | ICD-10-CM

## 2024-04-15 DIAGNOSIS — R2689 Other abnormalities of gait and mobility: Secondary | ICD-10-CM | POA: Diagnosis not present

## 2024-04-15 NOTE — Assessment & Plan Note (Signed)
 Currently on second drug holiday given risk of side effects and adverse events with prolonged treatment. Will plan to repeat DEXA next spring years for monitoring.   Plan -Off alendronate  -DEXA 2026

## 2024-04-15 NOTE — Patient Instructions (Addendum)
 It was wonderful to see you today!  *Please call the sleep center to schedule a sleep study. [facility] in [place], [place]  Address: [address], [place], [place] [address] Phone: [phone]  I have sent a referral to neurology for memory concerns.  I will call you with lab results when available!

## 2024-04-15 NOTE — Assessment & Plan Note (Signed)
 Continue rosuvastatin  for history of lacunar infarcts. Recheck lipids.  Plan -Rosuvastatin  20 mg daily -Lipid panel

## 2024-04-15 NOTE — Progress Notes (Signed)
 Established Patient Office Visit  Subjective   Patient ID: Nicholas Holt, male    DOB: [birth year]  Age: 43 y.o. MRN: [id number]  Chief Complaint  Patient presents with   Follow-up Visit    Short term memory is worse.   Nicholas Holt returns today for 3 month f/u appointment for chronic medical conditions. Patient is accompanied by his mother to today's visit. He is followed by Dr. Gordy Lauber, endocrinology, for panhypopituitarism. Last seen there 02/2024. Please see assessment/plan in problem-based charting for further details of today's visit.   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) 08/08/2006   Hyperlipidemia 08/08/2006   Attention deficit disorder 08/08/2006   Seizure disorder (HCC) 08/08/2006      Objective:     BP 133/72 (BP Location: Right Arm, Patient Position: Sitting, Cuff Size: Large)   Pulse 65   Temp 97.6 F (36.4 C) (Oral)   Ht 5\' 8"  (1.727 m)   Wt 199 lb 12.8 oz (90.6 kg)   SpO2 100% Comment: RA  BMI 30.38 kg/m  BP Readings from Last 3 Encounters:  04/15/24 133/72  03/31/24 134/83  01/08/24 137/85   Wt Readings from Last 3 Encounters:  04/15/24 199 lb 12.8 oz (90.6 kg)  03/31/24 199 lb 9.6 oz (90.5 kg)  01/08/24 202 lb 11.2 oz (91.9 kg)   Physical Exam Vitals reviewed.  Constitutional:      General: He is not in acute distress.    Appearance:  Normal appearance. He is not ill-appearing.  Musculoskeletal:     Comments: Brace to left lower extremity  Neurological:     General: No focal deficit present.     Mental Status: He is alert. Mental status is at baseline.     Cranial Nerves: No cranial nerve deficit.     Sensory: No sensory deficit.     Motor: No weakness.  Psychiatric:        Mood and Affect: Mood normal.        Behavior: Behavior normal.        Thought Content: Thought content normal.      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 Kathyanne Parkers, endocrinology.Continues on hydrocortisone , desmopressin , testosterone  gel, and levothyroxine . Decreased desmopressin  from four sprays to three at recent visit. Nicholas Holt' mother would like to check sodium following this change.  Plan -Sodium       Relevant Orders   Sodium     Nervous and Auditory   H/O malignant neoplasm of brain (Chronic)   Started physical therapy for gait abnormality and LLE weakness persistent since prior treatment for malignant brain tumor. Today, patient's mother is concerned about worsening short-term memory over the past month or so. No additional changes in mentation or behavior. No new weakness, sensory changes, vision changes. Neuro exam without  focal changes. Discussed referral to neurology for further evaluation. I do not think imaging is needed at this time but patient's mother will alert me to any new or worsening changes.  Plan -Referral to neurology for memory concerns      Relevant Orders   Ambulatory referral to Neurology     Musculoskeletal and Integument   Steroid-induced osteoporosis   Currently on second drug holiday given risk of side effects and adverse events with prolonged treatment. Will plan to repeat DEXA next spring years for monitoring.   Plan -Off alendronate  -DEXA 2026         Other   Hyperlipidemia - Primary (Chronic)   Continue rosuvastatin  for history of lacunar infarcts. Recheck lipids.  Plan -Rosuvastatin  20 mg daily -Lipid panel      Relevant Orders   Lipid Profile   Elevated CK   Long-standing history of elevated CK without clear etiology. Not symptomatic. Not thought to be secondary to statin use and statin needed for history of lacunar infarcts. Seen by rheumatology in 07/2023, thought potentially secondary to fluctuations in cortisol/thyroid  levels. Negative myositis panel and HMGCR Ab. Unlikely related to muscle relaxant due to chronicity. Patient's mother requests check today.  Plan -CK, monitor intermittently        Relevant Orders   CK, total    Return in about 3 months (around 07/16/2024).    Bevelyn Bryant, MD

## 2024-04-15 NOTE — Assessment & Plan Note (Signed)
 Follows with Dr. Kathyanne Parkers, endocrinology.Continues on hydrocortisone , desmopressin , testosterone  gel, and levothyroxine . Decreased desmopressin  from four sprays to three at recent visit. Nicholas Holt' mother would like to check sodium following this change.  Plan -Sodium

## 2024-04-15 NOTE — Assessment & Plan Note (Signed)
 Long-standing history of elevated CK without clear etiology. Not symptomatic. Not thought to be secondary to statin use and statin needed for history of lacunar infarcts. Seen by rheumatology in 07/2023, thought potentially secondary to fluctuations in cortisol/thyroid  levels. Negative myositis panel and HMGCR Ab. Unlikely related to muscle relaxant due to chronicity. Patient's mother requests check today.  Plan -CK, monitor intermittently

## 2024-04-15 NOTE — Assessment & Plan Note (Signed)
 Started physical therapy for gait abnormality and LLE weakness persistent since prior treatment for malignant brain tumor. Today, patient's mother is concerned about worsening short-term memory over the past month or so. No additional changes in mentation or behavior. No new weakness, sensory changes, vision changes. Neuro exam without focal changes. Discussed referral to neurology for further evaluation. I do not think imaging is needed at this time but patient's mother will alert me to any new or worsening changes.  Plan -Referral to neurology for memory concerns

## 2024-04-16 ENCOUNTER — Ambulatory Visit: Payer: Self-pay | Admitting: Internal Medicine

## 2024-04-16 LAB — CK: Total CK: 487 U/L — ABNORMAL HIGH (ref 49–439)

## 2024-04-16 LAB — LIPID PANEL
Chol/HDL Ratio: 2.4 ratio (ref 0.0–5.0)
Cholesterol, Total: 127 mg/dL (ref 100–199)
HDL: 52 mg/dL (ref >39)
LDL Chol Calc (NIH): 61 mg/dL (ref 0–99)
Triglycerides: 67 mg/dL (ref 0–149)
VLDL Cholesterol Cal: 14 mg/dL (ref 5–40)

## 2024-04-16 LAB — SODIUM: Sodium: 147 mmol/L — ABNORMAL HIGH (ref 134–144)

## 2024-04-17 DIAGNOSIS — R2689 Other abnormalities of gait and mobility: Secondary | ICD-10-CM | POA: Diagnosis not present

## 2024-04-22 DIAGNOSIS — R2689 Other abnormalities of gait and mobility: Secondary | ICD-10-CM | POA: Diagnosis not present

## 2024-05-12 ENCOUNTER — Ambulatory Visit (INDEPENDENT_AMBULATORY_CARE_PROVIDER_SITE_OTHER): Admitting: Neurology

## 2024-05-12 ENCOUNTER — Encounter: Payer: Self-pay | Admitting: Neurology

## 2024-05-12 VITALS — BP 122/70 | HR 88 | Ht 69.0 in | Wt 193.0 lb

## 2024-05-12 DIAGNOSIS — Z8669 Personal history of other diseases of the nervous system and sense organs: Secondary | ICD-10-CM

## 2024-05-12 DIAGNOSIS — E663 Overweight: Secondary | ICD-10-CM

## 2024-05-12 DIAGNOSIS — R0683 Snoring: Secondary | ICD-10-CM

## 2024-05-12 DIAGNOSIS — G4719 Other hypersomnia: Secondary | ICD-10-CM

## 2024-05-12 DIAGNOSIS — R6250 Unspecified lack of expected normal physiological development in childhood: Secondary | ICD-10-CM

## 2024-05-12 DIAGNOSIS — R531 Weakness: Secondary | ICD-10-CM

## 2024-05-12 NOTE — Progress Notes (Signed)
 Subjective:    Patient ID: Nicholas Holt is a 43 y.o. male.  HPI    True Mar, MD, PhD [facility] [address], [address] [address] [place], [place] [address]  Dear Dr. Karna,   I saw your patient, Nicholas Holt, upon your kind request in my sleep clinic today for initial consultation of his sleep disorder, in particular, evaluation of his prior diagnosis of obstructive sleep apnea. The patient is accompanied by his mother today. As you know, Nicholas Holt is a 43 year old male with an underlying complex medical history of brain tumor with status post surgery and shunt placement, status post chemotherapy, developmental delay, left-sided weakness, panhypopituitarism, hyperlipidemia, ADD, hypertension, hyponatremia, hypothyroidism, and balance, seizure disorder, weakness (for which she saw my colleague Dr. Margaret in 2023), and overweight state, who reports very little of his own history, history is provided by his mom.  She reports that he was diagnosed with sleep apnea several years ago but has never been on PAP therapy.  She is not sure if he would tolerate CPAP therapy.  He has trouble going to sleep and staying asleep and is sleepy during the day.  Snoring is reported.  He lives with his mom and dad.  There is no obvious family history of sleep apnea.  He does not have recurrent morning headaches or nocturnal headaches and no nightly nocturia.  Bedtime is around 11 and rise time between 6:30 AM and 7 AM.  He does have a TV in his bedroom and it tends to stay on all night, sometimes she comes in around 3 AM and turns it off.  He does not drink any daily caffeine, no alcohol, he is a non-smoker. I reviewed your office note from 04/15/2024.  Prior sleep study results are not available for my review today.  His Past Medical History Is Significant For: Past Medical History:  Diagnosis Date   Anxiety    Attention deficit disorder 08/08/2006   Bilateral leg cramps  11/25/2006   Blood transfusion without reported diagnosis 1997   Chemotherapy associated   Brain cancer (HCC) 1998   Cough 12/08/2015   COVID-19 10/16/2017   Elevated hemidiaphragm 07/09/2015   Left, presumably after portacath placement.    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  Complicated by left sided motor tic disorder and panhypopituitarism.    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  Includes central diabetes insipidus, secondary hypothyroidism, secondary adrenal insufficiency, and secondary hypogonadism.    Perennial allergic rhinitis 07/09/2015   Pre-diabetes    no meds   Preventative health care 07/09/2015   Seizure disorder (HCC) 08/08/2006   Predated brain tumor.  Generalized seizure ocurred after running out of lorazepam  post-tumor.    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 Treated with alendronate  from 2009-2016. DEXA (03/16/2008): L Spine T -2.0. L fem neck T -0.9, R fem neck T -1.9.  DEXA (10/15/2014): L Spine T -1.3, L fem neck T -0.6, R fem neck T -1.8.  Bisphosphonate holiday 06/2015.  Repeat DEXA  scan in 10/2016 to reassess.    Weakness 09/15/2023    His Past Surgical History Is Significant For: Past Surgical History:  Procedure Laterality Date   BRAIN BIOPSY  11/14/1995   CSF SHUNT  11/14/1995   PORTACATH PLACEMENT     portacath removal     RADIOLOGY WITH ANESTHESIA N/A 08/30/2021   Procedure: MRI WITH ANESTHESIA BRAIN WITH AND  WITHOUT CONTRAST;  Surgeon: Radiologist, Medication, MD;  Location: [facility] OR;  Service: Radiology;  Laterality: N/A;   RADIOLOGY WITH ANESTHESIA N/A 05/25/2022   Procedure: MRI WITH ANESTHESIA OF BRAIN WITH AND WITHOUT CONTRAST;  Surgeon: Radiologist, Medication, MD;  Location: [facility] OR;  Service: Radiology;  Laterality: N/A;    His Family History Is Significant For: Family History  Problem Relation Age of Onset   Hypertension Mother    Hypertension Father    Hyperlipidemia Father    Prostate cancer Father    Diabetes type I Sister        Died of complications of diabetes in her 37's   Sleep apnea Maternal Aunt     His Social History Is Significant For: Social History   Socioeconomic History   Marital status: Single    Spouse name: Not on file   Number of children: Not on file   Years of education: Not on file   Highest education level: Not on file  Occupational History   Not on file  Tobacco Use   Smoking status: Never    Passive exposure: Never   Smokeless tobacco: Never  Vaping Use   Vaping status: Never Used  Substance and Sexual Activity   Alcohol use: No    Alcohol/week: 0.0 standard drinks of alcohol   Drug use: No   Sexual activity: Not on file  Other Topics Concern   Not on file  Social History Narrative   Lives with parents.  Parents retired. Father or mother take him to appointments.  Mother sends notes if she can not take him.   Social Drivers of Corporate investment banker Strain: Low Risk  (03/17/2022)   Overall Financial Resource Strain (CARDIA)    Difficulty of Paying Living Expenses: Not hard at all  Food Insecurity: Patient Declined (04/23/2023)   Hunger Vital Sign    Worried About Running Out of Food in the Last Year: Patient declined    Ran Out of Food in the Last Year: Patient declined  Transportation Needs: Patient Declined (04/23/2023)   PRAPARE - Administrator, Civil Service (Medical): Patient declined    Lack of Transportation  (Non-Medical): Patient declined  Physical Activity: Patient Declined (04/23/2023)   Exercise Vital Sign    Days of Exercise per Week: Patient declined    Minutes of Exercise per Session: Patient declined  Stress: Patient Declined (04/23/2023)   [employer] of Occupational Health - Occupational Stress Questionnaire    Feeling of Stress : Patient declined  Social Connections: Patient Declined (04/23/2023)   Social Connection and Isolation Panel    Frequency of Communication with Friends and Family: Patient declined    Frequency of Social Gatherings with Friends and Family: Patient declined    Attends Religious Services: Patient declined    Database administrator or Organizations: Patient declined    Attends Banker Meetings: Patient declined    Marital Status: Patient declined    His Allergies Are:  No Active Allergies:   His Current Medications Are:  Outpatient Encounter Medications as of 05/12/2024  Medication  Sig   Apoaequorin (PREVAGEN) 10 MG CAPS 1 capsule Orally once a day   ascorbic acid (VITAMIN C) 1000 MG tablet 2 tablets Orally Once a day   aspirin  81 MG chewable tablet CHEW AND SWALLOW 1 TABLET(81 MG) BY MOUTH DAILY   Cyanocobalamin (B-12 PO) Take by mouth.   desmopressin  (DDAVP  NASAL) 0.01 % solution USE ONE SPRAY IN EACH NOSTRIL FOUR TIMES DAILY (Patient taking differently: Place 10 mcg into the nose in the morning, at noon, in the evening, and at bedtime.)   hydrocortisone  (CORTEF ) 5 MG tablet 1 tablet in early morning Orally Once a day but double the dose on sick days   levothyroxine  (SYNTHROID ) 100 MCG tablet Take 100 mcg by mouth daily before breakfast.   LORazepam  (ATIVAN ) 2 MG tablet TAKE 3 TABLETS(6 MG) BY MOUTH AT BEDTIME   losartan  (COZAAR ) 25 MG tablet TAKE 1 TABLET(25 MG) BY MOUTH DAILY   melatonin 3 MG TABS tablet Take 1 tablet (3 mg total) by mouth at bedtime.   Multiple Vitamin (MULTI VITAMIN) TABS 1 tablet Orally Once a day   Multiple  Vitamins-Minerals (ZINC PO) Take by mouth.   potassium chloride  (KLOR-CON ) 10 MEQ tablet Take 2 tablets (20 mEq total) by mouth daily.   rosuvastatin  (CRESTOR ) 20 MG tablet TAKE 1 TABLET(20 MG) BY MOUTH DAILY   Testosterone  (ANDROGEL  PUMP) 20.25 MG/ACT (1.62%) GEL Place 20.25 mg onto the skin daily. Apply 6 pumps 3 pumps per side) topically to shoulders or upper back once a day (Patient taking differently: Place 6 Pump onto the skin at bedtime. Apply 6 pumps 3 pumps per side) topically to shoulders or upper back once a day)   No facility-administered encounter medications on file as of 05/12/2024.  :   Review of Systems:  Out of a complete 14 point review of systems, all are reviewed and negative with the exception of these symptoms as listed below:  Review of Systems  Neurological:        Pt here for sleep consult Pt snores,fatigue,headache,controlled BP .Pt denies cpap machine, Mom states pt  had sleep study but didn't get the results Pt has developmental delay    ESS:18 FSS ;60     Objective:  Neurological Exam  Physical Exam Physical Examination:   Vitals:   05/12/24 1408  BP: 122/70  Pulse: 88    General Examination: The patient is a very pleasant 43 y.o. male in no acute distress. He appears frail and slender, minimally verbal, cooperative with exam.  Well-groomed.  HEENT: Pupils are equal, round and reactive to light, extraocular tracking is good without limitation to gaze excursion or nystagmus noted. Hearing is grossly intact. Face is symmetric with normal facial animation. Speech is scant.  Neck is supple with full range of passive and active motion. There are no carotid bruits on auscultation. Oropharynx exam reveals: moderate mouth dryness, adequate dental hygiene and mild airway crowding, due to small airway entry.  Mallampati class III, tonsils small or possibly absent?  Neck circumference 15 1/4 inches.  Mild overbite.  Tongue protrudes centrally and palate elevates  symmetrically.  Chest: Clear to auscultation without wheezing, rhonchi or crackles noted.  Heart: S1+S2+0, regular and normal without murmurs, rubs or gallops noted.   Abdomen: Soft, non-tender and non-distended.  Extremities: There is spasticity in the left upper and lower extremity, deformity left wrist.  Left AFO in place.  Skin: Warm and dry without trophic changes noted.   Musculoskeletal: exam reveals no obvious joint deformities.  Neurologically:  Mental status: The patient is awake, pays attention and cooperative with the exam.  He follows commands well.  Mood is normal and affect is normal.  Cranial nerves II - XII are as described above under HEENT exam.  Motor exam: Thin bulk, global strength 5 out of 5 but spasticity in the left upper and lower extremity noted.  There is no obvious action or resting tremor.  Fine motor skills and coordination: Globally mildly impaired, left more than right.    Cerebellar testing: No dysmetria or intention tremor.  Sensory exam: intact to light touch in the upper and lower extremities.  Gait, station and balance: He stands with mild difficulty walks slowly.  No walking aid, left AFO in place.  Assessment and Plan:  In summary, Nicholas Holt is a very pleasant 43 year old male with an underlying complex medical history of brain tumor with status post surgery and shunt placement, status post chemotherapy, developmental delay, left-sided weakness, panhypopituitarism, hyperlipidemia, ADD, hypertension, hyponatremia, hypothyroidism, and balance, seizure disorder, weakness (for which she saw my colleague Dr. Margaret in 2023), and overweight state, who presents for evaluation of his obstructive sleep apnea.  He was reportedly diagnosed several years ago but has not been on PAP therapy. A laboratory attended sleep study is typically considered gold standard for evaluation of sleep disordered breathing.   I had a long chat with the patient and his  mother about my findings and the diagnosis of sleep apnea, particularly OSA, its prognosis and treatment options. We talked about medical/conservative treatments, surgical interventions and non-pharmacological approaches for symptom control. I explained, in particular, the risks and ramifications of untreated moderate to severe OSA, especially with respect to developing cardiovascular disease down the road, including congestive heart failure (CHF), difficult to treat hypertension, cardiac arrhythmias (particularly A-fib), neurovascular complications including TIA, stroke and dementia. Even type 2 diabetes has, in part, been linked to untreated OSA. Symptoms of untreated OSA may include (but may not be limited to) daytime sleepiness, nocturia (i.e. frequent nighttime urination), memory problems, mood irritability and suboptimally controlled or worsening mood disorder such as depression and/or anxiety, lack of energy, lack of motivation, physical discomfort, as well as recurrent headaches, especially morning or nocturnal headaches. In addition, we talked about the importance of striving for and maintaining good sleep hygiene. I recommended a sleep study at this time. I outlined the differences between a laboratory attended sleep study which is considered more comprehensive and accurate over the option of a home sleep test (HST); the latter may lead to underestimation of sleep disordered breathing in some instances and does not help with diagnosing upper airway resistance syndrome and is not accurate enough to diagnose primary central sleep apnea typically. I outlined possible surgical and non-surgical treatment options of OSA, including the use of a positive airway pressure (PAP) device (i.e. CPAP, AutoPAP/APAP or BiPAP in certain circumstances), a custom-made dental device (aka oral appliance, which would require a referral to a specialist dentist or orthodontist typically, and is generally speaking not considered  for patients with full dentures or edentulous state), upper airway surgical options, such as traditional UPPP (which is not considered a first-line treatment) or the Inspire device (hypoglossal nerve stimulator, which would involve a referral for consultation with an ENT surgeon, after careful selection, following inclusion criteria - also not first-line treatment). I explained the PAP treatment option to the patient in detail, as this is generally considered first-line treatment.   We will pick up our discussion about the  next steps and treatment options after testing.  We will keep them posted as to the test results by phone call and/or MyChart messaging where possible.  We will plan to follow-up in sleep clinic accordingly as well.  I answered all their questions today and the patient and his mom were in agreement.   I encouraged them to call with any interim questions, concerns, problems or updates or email us  through MyChart.  Generally speaking, sleep test authorizations may take up to 2 weeks, sometimes less, sometimes longer, the patient is encouraged to get in touch with us  if they do not hear back from the sleep lab staff directly within the next 2 weeks.  Thank you very much for allowing me to participate in the care of this nice patient. If I can be of any further assistance to you please do not hesitate to call me at [phone].  Sincerely,   True Mar, MD, PhD

## 2024-05-12 NOTE — Patient Instructions (Addendum)
Thank you for choosing [facility] for your sleep related care! It was nice to meet you today!   Here is what we discussed today:    Based on your symptoms and your exam I believe you still are at risk for obstructive sleep apnea (aka OSA). We should proceed with a sleep study to determine whether you do or do not have OSA and how severe it is. Even, if you have mild OSA, I may want you to consider treatment with CPAP, as treatment of even borderline or mild sleep apnea can result and improvement of symptoms such as sleep disruption, daytime sleepiness, nighttime bathroom breaks, restless leg symptoms, improvement of headache syndromes, even improved mood disorder.   As explained, an attended sleep study (meaning you get to stay overnight in the sleep lab), lets [place] monitor sleep-related behaviors such as sleep talking and leg movements in sleep, in addition to monitoring for sleep apnea.  A home sleep test is a screening tool for sleep apnea diagnosis only, but unfortunately, does not help with any other sleep-related diagnoses.  Please remember, the long-term risks and ramifications of untreated moderate to severe obstructive sleep apnea may include (but are not limited to): increased risk for cardiovascular disease, including congestive heart failure, stroke, difficult to control hypertension, treatment resistant obesity, arrhythmias, especially irregular heartbeat commonly known as A. Fib. (atrial fibrillation); even type 2 diabetes has been linked to untreated OSA.   Other correlations that untreated obstructive sleep apnea include macular edema which is swelling of the retina in the eyes, droopy eyelid syndrome, and elevated hemoglobin and hematocrit levels (often referred to as polycythemia).  Sleep apnea can cause disruption of sleep and sleep deprivation in most cases, which, in turn, can cause recurrent headaches, problems with memory, mood, concentration, focus, and  vigilance. Most people with untreated sleep apnea report excessive daytime sleepiness, which can affect their ability to drive. Please do not drive or use heavy equipment or machinery, if you feel sleepy! Patients with sleep apnea can also develop difficulty initiating and maintaining sleep (aka insomnia).   Having sleep apnea may increase your risk for other sleep disorders, including involuntary behaviors sleep such as sleep terrors, sleep talking, sleepwalking.    Having sleep apnea can also increase your risk for restless leg syndrome and leg movements at night.   Please note that untreated obstructive sleep apnea may carry additional perioperative morbidity. Patients with significant obstructive sleep apnea (typically, in the moderate to severe degree) should receive, if possible, perioperative PAP (positive airway pressure) therapy and the surgeons and particularly the anesthesiologists should be informed of the diagnosis and the severity of the sleep disordered breathing.   We will call you or email you through [place] with regards to your test results and plan a follow-up in sleep clinic accordingly. Most likely, you will hear from one of our nurses.   Our sleep lab administrative assistant will call you to schedule your sleep study and give you further instructions, regarding the check in process for the sleep study, arrival time, what to bring, when you can expect to leave after the study, etc., and to answer any other logistical questions you may have. If you don't hear back from her by about 2 weeks from now, please feel free to call her direct line at [phone] or you can call our general clinic number, or email [place] through My Chart.

## 2024-05-14 ENCOUNTER — Telehealth: Payer: Self-pay | Admitting: Neurology

## 2024-05-14 NOTE — Telephone Encounter (Signed)
 NPSG [employer] medicare/medicaid no auth req EE per Dr. Buck Recommend one on one and mom requesting to stay. Please assign rm with recliner.    Patient is scheduled at [facility] for 06/16/24 at 8 pm.  Mailed packet to the patient.

## 2024-05-17 ENCOUNTER — Other Ambulatory Visit: Payer: Self-pay | Admitting: Internal Medicine

## 2024-05-17 DIAGNOSIS — F4322 Adjustment disorder with anxiety: Secondary | ICD-10-CM

## 2024-05-17 DIAGNOSIS — G40909 Epilepsy, unspecified, not intractable, without status epilepticus: Secondary | ICD-10-CM

## 2024-06-16 ENCOUNTER — Ambulatory Visit (INDEPENDENT_AMBULATORY_CARE_PROVIDER_SITE_OTHER): Admitting: Neurology

## 2024-06-16 DIAGNOSIS — G4719 Other hypersomnia: Secondary | ICD-10-CM

## 2024-06-16 DIAGNOSIS — Z8669 Personal history of other diseases of the nervous system and sense organs: Secondary | ICD-10-CM

## 2024-06-16 DIAGNOSIS — G4733 Obstructive sleep apnea (adult) (pediatric): Secondary | ICD-10-CM | POA: Diagnosis not present

## 2024-06-16 DIAGNOSIS — G4720 Circadian rhythm sleep disorder, unspecified type: Secondary | ICD-10-CM

## 2024-06-16 DIAGNOSIS — R6250 Unspecified lack of expected normal physiological development in childhood: Secondary | ICD-10-CM

## 2024-06-16 DIAGNOSIS — E663 Overweight: Secondary | ICD-10-CM

## 2024-06-16 DIAGNOSIS — R0683 Snoring: Secondary | ICD-10-CM

## 2024-06-16 DIAGNOSIS — R531 Weakness: Secondary | ICD-10-CM

## 2024-06-17 NOTE — Procedures (Signed)
 Physician Interpretation: see procedure note.

## 2024-06-21 ENCOUNTER — Other Ambulatory Visit: Payer: Self-pay | Admitting: Internal Medicine

## 2024-06-21 DIAGNOSIS — G40909 Epilepsy, unspecified, not intractable, without status epilepticus: Secondary | ICD-10-CM

## 2024-06-21 DIAGNOSIS — F4322 Adjustment disorder with anxiety: Secondary | ICD-10-CM

## 2024-06-23 ENCOUNTER — Ambulatory Visit: Payer: Self-pay | Admitting: Neurology

## 2024-06-23 ENCOUNTER — Ambulatory Visit: Payer: Self-pay

## 2024-06-23 DIAGNOSIS — G4733 Obstructive sleep apnea (adult) (pediatric): Secondary | ICD-10-CM

## 2024-06-23 NOTE — Telephone Encounter (Signed)
 Pt mother calling states that she needs an appt with PCP for her son. Mother would not discuss reason for the visit with RN. Pt was scheduled.   Message from Sheridan F sent at 06/23/2024  4:26 PM EDT  Patient's mother, [place], is calling stating that there is an issue but that she doesn't want to discuss the matter with the specialist. She stated the matter is new.   Phone: [phone]

## 2024-06-24 NOTE — Telephone Encounter (Addendum)
 I talked to pt's mother who stated the doctor should know the reason for the appt; stated pt  needs to be seen every 3 months. (I was calling to make sure pt did not need a sooner appt) Pt's mother became offensive.Stated she should not have to state the reason for the appt. I told her I was just asking and she does not have to tell me. She stated if the doctor doesn't want to see him, she will go somewhere else; I told I did not  say we do not  want to see him.  She stated she never had to give the reason for the appt before. And I told her we will see her at his appt.

## 2024-06-26 NOTE — Telephone Encounter (Signed)
 RE: new autopap user Received: Today New, Adine Neysa Nena GORMAN, RN; Joylene Carlean Sheree Leveda Viktoria Dortha Jackson Avelina; 1 other Received, Thank you!     Previous Messages    ----- Message ----- From: Neysa Nena GORMAN, RN Sent: 06/26/2024  11:35 AM EDT To: Adine Joylene; Avelina Jackson; Ephraim Viktoria* Subject: new autopap user                              New order in EPIC  Call mother of pt for information  Sanuel C. Faulk Male, 43 y.o., 08-27-1981 Pronouns: he/him/his MRN: [id number] Phone: [phone] BENNIE)  [place]

## 2024-06-26 NOTE — Telephone Encounter (Signed)
 I called pts mother. Gave her results of sleep study per below.  She verbalized understanding of results.  Understands to use 4hr or more every night. We see back in 2-3 months after using the machine.  DME [employer].  They will call to authorize insurance. [phone].  I sent message to [employer].

## 2024-06-26 NOTE — Telephone Encounter (Signed)
-----   Message from True Mar sent at 06/23/2024  2:08 PM EDT ----- Pls call mother. Patient referred by PCP, seen by me on 05/12/24, diagnostic PSG on 06/16/24.    Please call and notify the patient that the recent sleep study did confirm the diagnosis of obstructive sleep apnea. OSA is overall mild, but worth treating to see if he feels better after treatment.  To that end I recommend treatment for this in the form of autoPAP, which means, that we don't have to bring him back for a second sleep study with CPAP, but will let him try an autoPAP machine at  home, through a DME company (of his choice, or as per insurance requirement). The DME representative will educate him on how to use the machine, how to put the mask on, etc. I have placed an order in  the chart. Please send referral, talk to patient, send report to referring MD. We will need a FU in sleep clinic for 10 weeks post-PAP set up, please arrange that with me or one of our NPs. Thanks,   True Mar, MD, PhD [facility])   ----- Message ----- From: Mar True, MD Sent: 06/23/2024   2:06 PM EDT To: True Mar, MD

## 2024-07-08 ENCOUNTER — Ambulatory Visit: Payer: Self-pay | Admitting: Internal Medicine

## 2024-07-08 ENCOUNTER — Encounter: Payer: Self-pay | Admitting: Internal Medicine

## 2024-07-08 VITALS — BP 138/91 | HR 55 | Temp 97.5°F | Ht 68.0 in | Wt 196.8 lb

## 2024-07-08 DIAGNOSIS — G4733 Obstructive sleep apnea (adult) (pediatric): Secondary | ICD-10-CM

## 2024-07-08 DIAGNOSIS — R3981 Functional urinary incontinence: Secondary | ICD-10-CM

## 2024-07-08 DIAGNOSIS — I10 Essential (primary) hypertension: Secondary | ICD-10-CM

## 2024-07-08 DIAGNOSIS — E230 Hypopituitarism: Secondary | ICD-10-CM

## 2024-07-08 DIAGNOSIS — R197 Diarrhea, unspecified: Secondary | ICD-10-CM | POA: Diagnosis not present

## 2024-07-08 NOTE — Assessment & Plan Note (Signed)
 Patient's mom describing 2-3 weeks of nocturnal urinary incontinence since they had to move downstairs due to a broken heating/cooling unit in the upstairs bedrooms. Bathroom is on the second floor and Trevyon is having a hard time getting there in time during the night. He does have baseline issues with gait and wears a left foot/ankle brace due to history of foot drop. No issues with incontinence during the day. No dysuria or signs of infection. No additional red flags. Patient would benefit from incontinence supplies to assist especially at night. If this starts happening more frequently, additional symptoms present, or starts occurring during the day or after moving back upstairs, patient's mom will let me know.  Plan -Incontinence supplies, form to be filled out

## 2024-07-08 NOTE — Assessment & Plan Note (Signed)
 Several months of increased stool frequency, 3-4 BM/day. Jona descries these as soft/mushy, not watery. No blood. No abdominal pain, nausea, diarrhea. No fevers or weight loss. No family history of IBD or CRC. No recent antibiotic use or change in medications. Will start by checking labs. No red flag symptoms. Would be due for colonoscopy next spring as well, referral to GI discussed with patient and mom.   Plan -CBC, BMP, TSH/fT4 -Referral to GI for CSY

## 2024-07-08 NOTE — Assessment & Plan Note (Signed)
 Completed sleep study. Mild sleep apnea, recommended PAP therapy. They have not yet received the mask. Encouraged adherence.

## 2024-07-08 NOTE — Progress Notes (Signed)
 Established Patient Office Visit  Subjective   Patient ID: Nicholas Holt, male    DOB: 03/14/1981  Age: 43 y.o. MRN: [id number]  Chief Complaint  Patient presents with   Follow-up    Nicholas Holt returns to clinic today with his mother, [name], for f/u of chronic medical conditions, as well as to discuss incontinence and change in bowel habits. Please see assessment/plan in problem-based charting for further details of today's visit.    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3 kg)   SpO2 95% Comment: RA  BMI 29.92 kg/m  BP Readings from Last 3 Encounters:  07/08/24 (!) 138/91  05/12/24 122/70  04/15/24 133/72   Wt Readings from Last 3 Encounters:  07/08/24 196 lb 12.8 oz (89.3 kg)  05/12/24 193 lb (87.5 kg)  04/15/24 199 lb 12.8 oz (90.6 kg)    Physical Exam Constitutional:      General: He is not in acute distress.    Appearance: Normal appearance. He is not  ill-appearing.  Cardiovascular:     Rate and Rhythm: Regular rhythm. Bradycardia present.     Heart sounds: Normal heart sounds.  Pulmonary:     Effort: Pulmonary effort is normal.     Breath sounds: Normal breath sounds.  Abdominal:     General: Abdomen is flat. Bowel sounds are normal. There is no distension.     Palpations: Abdomen is soft. There is no mass.     Tenderness: There is no abdominal tenderness. There is no guarding.  Skin:    General: Skin is warm and dry.  Neurological:     Mental Status: He is alert. Mental status is at baseline.  Psychiatric:        Mood and Affect: Mood normal.        Behavior: Behavior normal.       Assessment & Plan:   Problem List Items Addressed This Visit       Cardiovascular and Mediastinum   Hypertension   Currently on losartan  25 mg daily. Blood pressures at recent visits have been within goal. Mildly elevated today 130/90s. Not currently measuring home pressures. Previously also on amlodipine  but stopped due to lows. Advised patient and mom to monitor daily for the next two weeks, return to clinic for RN BP visit.  Plan -Home monitoring, return in 2 weeks for RN BP visit -Continue losartan  25 mg, if remains elevated we can consider adding back amlodipine  5 mg  Respiratory   OSA (obstructive sleep apnea)   Completed sleep study. Mild sleep apnea, recommended PAP therapy. They have not yet received the mask. Encouraged adherence.        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 Faythe, endocrinology. Last appointment in April 2025, f/u recommended in 6 months. Mr. Perine' mother notes Nicholas Holt has been taking 3-4 sprays of desmopressin  depending on how much he urinates. We discussed this may not be the most accurate way to decide need for desmopressin  dose given additional factors that may contribute to urine output. Will recheck sodium  today and f/u with Dr. Faythe. Also having more frequent bowel movements as outlined elsewhere, recheck thyroid  labs.  Plan -BMP, TSH/fT4 -F/u with Dr. Faythe 08/2024, need to schedule        Relevant Orders   Basic metabolic panel with GFR   TSH + free T4     Other   Diarrhea   Several months of increased stool frequency, 3-4 BM/day. Nicholas Holt descries these as soft/mushy, not watery. No blood. No abdominal pain, nausea, diarrhea. No fevers or weight loss. No family history of IBD or CRC. No recent antibiotic use or change in medications. Will start by checking labs. No red flag symptoms. Would be due for colonoscopy next spring as well, referral to GI discussed with patient and mom.   Plan -CBC, BMP, TSH/fT4 -Referral to GI for CSY      Relevant Orders   CBC no Diff   Functional urinary incontinence   Patient's mom describing 2-3 weeks of nocturnal urinary incontinence since they had to move downstairs due to a broken heating/cooling unit in the upstairs bedrooms. Bathroom is on the second floor and Nicholas Holt is having a hard time getting there in time during the night. He does have baseline issues with gait and wears a left foot/ankle brace due to history of foot drop. No issues with incontinence during the day. No dysuria or signs of infection. No additional red flags. Patient would benefit from incontinence supplies to assist especially at night. If this starts happening more frequently, additional symptoms present, or starts occurring during the day or after moving back upstairs, patient's mom will let me know.  Plan -Incontinence supplies, form to be filled out       Return in about 3 months (around 10/08/2024) for regular f/u AND 2 week RN BP visit.    Ronnald Sergeant, MD

## 2024-07-08 NOTE — Assessment & Plan Note (Signed)
 Following with Dr. Faythe, endocrinology. Last appointment in April 2025, f/u recommended in 6 months. [name]' mother notes Nicholas Holt has been taking 3-4 sprays of desmopressin  depending on how much he urinates. We discussed this may not be the most accurate way to decide need for desmopressin  dose given additional factors that may contribute to urine output. Will recheck sodium today and f/u with Dr. Faythe. Also having more frequent bowel movements as outlined elsewhere, recheck thyroid  labs.  Plan -BMP, TSH/fT4 -F/u with Dr. Faythe 08/2024, need to schedule

## 2024-07-08 NOTE — Assessment & Plan Note (Signed)
 Currently on losartan  25 mg daily. Blood pressures at recent visits have been within goal. Mildly elevated today 130/90s. Not currently measuring home pressures. Previously also on amlodipine  but stopped due to lows. Advised patient and mom to monitor daily for the next two weeks, return to clinic for RN BP visit.  Plan -Home monitoring, return in 2 weeks for RN BP visit -Continue losartan  25 mg, if remains elevated we can consider adding back amlodipine  5 mg

## 2024-07-08 NOTE — Patient Instructions (Addendum)
 It was wonderful to see you today!  Please monitor your blood pressure at least once daily (at different times of the day) and write them down. Come back in two weeks for an BP check and we will see if you need another medication.  I will call you with lab results.   Make sure you get regular follow-up scheduled with Dr. Faythe for around October.  Please contact your pharmacy or make an appointment in clinic in September/October for your annual flu shot!

## 2024-07-09 ENCOUNTER — Ambulatory Visit: Payer: Self-pay | Admitting: Internal Medicine

## 2024-07-09 DIAGNOSIS — D696 Thrombocytopenia, unspecified: Secondary | ICD-10-CM

## 2024-07-09 LAB — CBC
Hematocrit: 41.8 % (ref 37.5–51.0)
Hemoglobin: 13.4 g/dL (ref 13.0–17.7)
MCH: 30.3 pg (ref 26.6–33.0)
MCHC: 32.1 g/dL (ref 31.5–35.7)
MCV: 95 fL (ref 79–97)
Platelets: 133 x10E3/uL — ABNORMAL LOW (ref 150–450)
RBC: 4.42 x10E6/uL (ref 4.14–5.80)
RDW: 13.1 % (ref 11.6–15.4)
WBC: 4.5 x10E3/uL (ref 3.4–10.8)

## 2024-07-09 LAB — BASIC METABOLIC PANEL WITH GFR
BUN/Creatinine Ratio: 14 (ref 9–20)
BUN: 16 mg/dL (ref 6–24)
CO2: 25 mmol/L (ref 20–29)
Calcium: 9 mg/dL (ref 8.7–10.2)
Chloride: 106 mmol/L (ref 96–106)
Creatinine, Ser: 1.12 mg/dL (ref 0.76–1.27)
Glucose: 83 mg/dL (ref 70–99)
Potassium: 3.9 mmol/L (ref 3.5–5.2)
Sodium: 144 mmol/L (ref 134–144)
eGFR: 83 mL/min/1.73 (ref >59)

## 2024-07-09 LAB — TSH+FREE T4
Free T4: 1.37 ng/dL (ref 0.82–1.77)
TSH: 0.189 u[IU]/mL — ABNORMAL LOW (ref 0.450–4.500)

## 2024-07-09 NOTE — Progress Notes (Signed)
 BMP wnl. Sodium normal. Noted to be quite dry on lab draw by lab staff, discussed moderate hydration and repeat labs at Dr. Micheline visit in October.

## 2024-07-09 NOTE — Progress Notes (Signed)
 Low TSH, normal fT4. Possible contribution to increased stool frequency. Discussed with patient's mother, Nicholas Holt, who notes [name] may occasionally take two levothyroxine  tablets/day on accident. She will monitor this closely and I have advised her to discuss lab results and symptoms with Dr. Faythe for potential adjustments.

## 2024-07-09 NOTE — Progress Notes (Signed)
 Mild thrombocytopenia, new. Will have [facility] come back next week for confirmation. Discussed with patient's mother, Nocomus Vanderwall, 8/27.

## 2024-07-15 ENCOUNTER — Other Ambulatory Visit (INDEPENDENT_AMBULATORY_CARE_PROVIDER_SITE_OTHER)

## 2024-07-15 DIAGNOSIS — D696 Thrombocytopenia, unspecified: Secondary | ICD-10-CM

## 2024-07-15 LAB — TECHNOLOGIST SMEAR REVIEW: Plt Morphology: NORMAL

## 2024-07-15 LAB — CBC WITH DIFFERENTIAL/PLATELET
Abs Immature Granulocytes: 0.01 K/uL (ref 0.00–0.07)
Basophils Absolute: 0 K/uL (ref 0.0–0.1)
Basophils Relative: 0 %
Eosinophils Absolute: 0.1 K/uL (ref 0.0–0.5)
Eosinophils Relative: 1 %
HCT: 41.5 % (ref 39.0–52.0)
Hemoglobin: 13.2 g/dL (ref 13.0–17.0)
Immature Granulocytes: 0 %
Lymphocytes Relative: 48 %
Lymphs Abs: 2.4 K/uL (ref 0.7–4.0)
MCH: 29.7 pg (ref 26.0–34.0)
MCHC: 31.8 g/dL (ref 30.0–36.0)
MCV: 93.5 fL (ref 80.0–100.0)
Monocytes Absolute: 0.4 K/uL (ref 0.1–1.0)
Monocytes Relative: 8 %
Neutro Abs: 2.2 K/uL (ref 1.7–7.7)
Neutrophils Relative %: 43 %
Platelets: 167 K/uL (ref 150–400)
RBC: 4.44 MIL/uL (ref 4.22–5.81)
RDW: 14 % (ref 11.5–15.5)
WBC: 5 K/uL (ref 4.0–10.5)
nRBC: 0 % (ref 0.0–0.2)

## 2024-07-15 NOTE — Addendum Note (Signed)
 Addended by: ANTONE DWAYNE SAILOR on: 07/15/2024 10:39 AM   Modules accepted: Orders

## 2024-07-17 ENCOUNTER — Telehealth: Payer: Self-pay | Admitting: *Deleted

## 2024-07-17 ENCOUNTER — Ambulatory Visit: Payer: Self-pay | Admitting: Internal Medicine

## 2024-07-17 NOTE — Progress Notes (Signed)
 Platelets wnl. Discussed with patient's mother, Nocomus Blessinger, on 07/17/24.

## 2024-07-17 NOTE — Telephone Encounter (Signed)
 Copied from CRM [id number]. Topic: Clinical - Lab/Test Results >> Jul 17, 2024 11:17 AM Cherylann RAMAN wrote: Reason for CRM: Noccomas, mother, requesting results from labs that were completed on 09/02. Please contact mother at [phone]

## 2024-07-21 ENCOUNTER — Other Ambulatory Visit: Payer: Self-pay | Admitting: Internal Medicine

## 2024-07-21 ENCOUNTER — Ambulatory Visit (INDEPENDENT_AMBULATORY_CARE_PROVIDER_SITE_OTHER): Admitting: Diagnostic Neuroimaging

## 2024-07-21 ENCOUNTER — Encounter: Payer: Self-pay | Admitting: Diagnostic Neuroimaging

## 2024-07-21 VITALS — BP 129/78 | HR 78

## 2024-07-21 DIAGNOSIS — R413 Other amnesia: Secondary | ICD-10-CM

## 2024-07-21 DIAGNOSIS — F4322 Adjustment disorder with anxiety: Secondary | ICD-10-CM

## 2024-07-21 DIAGNOSIS — F909 Attention-deficit hyperactivity disorder, unspecified type: Secondary | ICD-10-CM | POA: Diagnosis not present

## 2024-07-21 DIAGNOSIS — G40909 Epilepsy, unspecified, not intractable, without status epilepticus: Secondary | ICD-10-CM

## 2024-07-21 NOTE — Patient Instructions (Addendum)
  MEMORY LOSS (since ~age 43 years old, suprasellar germinoma s/p treatment) - some worsening symptoms in last 5-6 months (likely related to underlying ADHD, prior brain tumor treatments, insomnia and sleep apnea)  - recommend to optimize sleep apnea treatment - recommend to get hearing testing - refer to psychiatry evaluation for ADHD treatment (previous treatment in high school)

## 2024-07-21 NOTE — Progress Notes (Signed)
 [facility]  PATIENT: Nicholas Holt DOB: 03-Nov-1981  REFERRING CLINICIAN: Karna Fellows, MD HISTORY FROM: patient  REASON FOR VISIT: new consult    HISTORICAL  CHIEF COMPLAINT:  Chief Complaint  Patient presents with   Memory Loss    Rm 7 with parents  Pt is well, mother reports his short term memory is declining. He is repetative in asking questions and forgets what was told to him recently.     HISTORY OF PRESENT ILLNESS:   UPDATE (07/21/24, VRP): Since last visit, doing well overall. Some continued memory loss issues. Mainly play video games (no memory issues with that). Also noted to be eating his meals more quickly. No seizures. No new medical issues.  PRIOR HPI (06/15/22, VRP): 43 year old male here for evaluation of gait and balance difficulty.  Patient had normal birth and development.  Had some seizures in early childhood, managed with medication, but that he outgrew these.  Age 8 years old patient had increasing vomiting and increasing thirst.  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  He was treated with radiation and chemotherapy including cisplatin vincristine etoposide and cyclophosphamide.  Soon after treatment he developed left arm and left leg weakness, increased tone and hemiparesis.  He was able to return to high school and complete although he had some focus and attention difficulties requiring stimulants.  Generally patient was stable over many years although some tendency to walk on toes developed over time.  Mother was primary caregiver.   October 2022 had some worsening memory loss and had MRI of the brain.  At that time some interval microvascular ischemic changes were noted compared to 2008.   May 2023 patient started having increasing gait and balance difficulties and falls.  Had evaluation lab testing and was noted to have slightly elevated CK levels, although this was noted 10 years prior as well.   Increasing fluid intake was encouraged.  Symptoms continue to worsen.  Patient had MRI of the brain with general anesthesia on 05/25/22 which showed no acute findings but showed some slight progression of microvascular ischemic changes.  Due to this patient was admitted to the hospital for further evaluation on 05/29/22. Once in the hospital he was noted to have hyponatremia.  This was felt to be related to chronic adrenal insufficiency and water restriction was encouraged.  Unfortunately patient's mother continue to increase fluid intake because she thought he was dehydrated and that he needs more water.  Since that time sodium levels have continued to drop down to 118.  Now he is seeing endocrinology, following fluid restriction, and sodium levels are slightly improving.   REVIEW OF SYSTEMS: Full 14 system review of systems performed and negative with exception of: as per HPI.  ALLERGIES: No Active Allergies   HOME MEDICATIONS: Outpatient Medications Prior to Visit  Medication Sig Dispense Refill   Apoaequorin (PREVAGEN) 10 MG CAPS 1 capsule Orally once a day     ascorbic acid (VITAMIN C) 1000 MG tablet 2 tablets Orally Once a day     aspirin  81 MG chewable tablet CHEW AND SWALLOW 1 TABLET(81 MG) BY MOUTH DAILY 90 tablet 3   Cyanocobalamin (B-12 PO) Take by mouth.     desmopressin  (DDAVP  NASAL) 0.01 % solution USE ONE SPRAY IN EACH NOSTRIL FOUR TIMES DAILY 70 mL 3   hydrocortisone  (CORTEF ) 5 MG tablet 1 tablet in early morning Orally Once a day but double the dose on sick days  levothyroxine  (SYNTHROID ) 100 MCG tablet Take 100 mcg by mouth daily before breakfast.     LORazepam  (ATIVAN ) 2 MG tablet TAKE 3 TABLETS(6 MG) BY MOUTH AT BEDTIME 90 tablet 0   losartan  (COZAAR ) 25 MG tablet TAKE 1 TABLET(25 MG) BY MOUTH DAILY 90 tablet 3   melatonin 3 MG TABS tablet Take 1 tablet (3 mg total) by mouth at bedtime. 90 tablet 3   Multiple Vitamin (MULTI VITAMIN) TABS 1 tablet Orally Once a day      potassium chloride  (KLOR-CON ) 10 MEQ tablet Take 2 tablets (20 mEq total) by mouth daily. 180 tablet 3   rosuvastatin  (CRESTOR ) 20 MG tablet TAKE 1 TABLET(20 MG) BY MOUTH DAILY 90 tablet 3   Testosterone  (ANDROGEL  PUMP) 20.25 MG/ACT (1.62%) GEL Place 20.25 mg onto the skin daily. Apply 6 pumps 3 pumps per side) topically to shoulders or upper back once a day 75 g 3   Multiple Vitamins-Minerals (ZINC PO) Take by mouth. (Patient not taking: Reported on 07/21/2024)     No facility-administered medications prior to visit.    PAST MEDICAL HISTORY: Past Medical History:  Diagnosis Date   Anxiety    Attention deficit disorder 08/08/2006   Bilateral leg cramps 11/25/2006   Blood transfusion without reported diagnosis 1997   Chemotherapy associated   Brain cancer (HCC) 1998   Cough 12/08/2015   COVID-19 10/16/2017   Elevated hemidiaphragm 07/09/2015   Left, presumably after portacath placement.    Fall 04/13/2022   H/O malignant neoplasm of brain 09/22/2014   Suprasellar germinoma treated at [facility] in 1997; received chemotherapy (cisplatin, etoposide, vincristine, and cyclophosphomide) and radiation therapy.  Complicated by left sided motor tic disorder and panhypopituitarism.    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  Includes central diabetes insipidus, secondary hypothyroidism, secondary adrenal insufficiency, and secondary hypogonadism.    Perennial allergic rhinitis 07/09/2015   Pre-diabetes    no meds   Preventative health care 07/09/2015   Seizure disorder (HCC) 08/08/2006   Predated brain tumor.  Generalized seizure ocurred after running out of lorazepam   post-tumor.    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 Treated with alendronate  from 2009-2016. DEXA (03/16/2008): L Spine T -2.0. L fem neck T -0.9, R fem neck T -1.9.  DEXA (10/15/2014): L Spine T -1.3, L fem neck T -0.6, R fem neck T -1.8.  Bisphosphonate holiday 06/2015.  Repeat DEXA scan in 10/2016 to reassess.    Weakness 09/15/2023    PAST SURGICAL HISTORY: Past Surgical History:  Procedure Laterality Date   BRAIN BIOPSY  11/14/1995   CSF SHUNT  11/14/1995   PORTACATH PLACEMENT     portacath removal     RADIOLOGY WITH ANESTHESIA N/A 08/30/2021   Procedure: MRI WITH ANESTHESIA BRAIN WITH AND WITHOUT CONTRAST;  Surgeon: Radiologist, Medication, MD;  Location: [facility] OR;  Service: Radiology;  Laterality: N/A;   RADIOLOGY WITH ANESTHESIA N/A 05/25/2022   Procedure: MRI WITH ANESTHESIA OF BRAIN WITH AND WITHOUT CONTRAST;  Surgeon: Radiologist, Medication, MD;  Location: [facility] OR;  Service: Radiology;  Laterality: N/A;    FAMILY HISTORY: Family History  Problem Relation Age of Onset   Hypertension Mother  Hypertension Father    Hyperlipidemia Father    Prostate cancer Father    Diabetes type I Sister        Died of complications of diabetes in her 19's   Sleep apnea Maternal Aunt     SOCIAL HISTORY: Social History   Socioeconomic History   Marital status: Single    Spouse name: Not on file   Number of children: Not on file   Years of education: Not on file   Highest education level: Not on file  Occupational History   Not on file  Tobacco Use   Smoking status: Never    Passive exposure: Never   Smokeless tobacco: Never  Vaping Use   Vaping status: Never Used  Substance and Sexual Activity   Alcohol use: No    Alcohol/week: 0.0 standard drinks of alcohol   Drug use: No   Sexual activity: Not on file  Other Topics Concern   Not on file  Social History Narrative   Lives with parents.  Parents retired. Father or mother take him to  appointments.  Mother sends notes if she can not take him.   Social Drivers of Corporate investment banker Strain: Low Risk  (03/17/2022)   Overall Financial Resource Strain (CARDIA)    Difficulty of Paying Living Expenses: Not hard at all  Food Insecurity: Patient Declined (04/23/2023)   Hunger Vital Sign    Worried About Running Out of Food in the Last Year: Patient declined    Ran Out of Food in the Last Year: Patient declined  Transportation Needs: Patient Declined (04/23/2023)   PRAPARE - Administrator, Civil Service (Medical): Patient declined    Lack of Transportation (Non-Medical): Patient declined  Physical Activity: Patient Declined (04/23/2023)   Exercise Vital Sign    Days of Exercise per Week: Patient declined    Minutes of Exercise per Session: Patient declined  Stress: Patient Declined (04/23/2023)   [employer] of Occupational Health - Occupational Stress Questionnaire    Feeling of Stress : Patient declined  Social Connections: Patient Declined (04/23/2023)   Social Connection and Isolation Panel    Frequency of Communication with Friends and Family: Patient declined    Frequency of Social Gatherings with Friends and Family: Patient declined    Attends Religious Services: Patient declined    Database administrator or Organizations: Patient declined    Attends Banker Meetings: Patient declined    Marital Status: Patient declined  Intimate Partner Violence: Patient Declined (04/23/2023)   Humiliation, Afraid, Rape, and Kick questionnaire    Fear of Current or Ex-Partner: Patient declined    Emotionally Abused: Patient declined    Physically Abused: Patient declined    Sexually Abused: Patient declined     PHYSICAL EXAM  GENERAL EXAM/CONSTITUTIONAL: Vitals:  Vitals:   07/21/24 1452  BP: 129/78  Pulse: 78   There is no height or weight on file to calculate BMI. Wt Readings from Last 3 Encounters:  07/08/24 196 lb 12.8 oz (89.3 kg)   05/12/24 193 lb (87.5 kg)  04/15/24 199 lb 12.8 oz (90.6 kg)   Patient is in no distress; well developed, nourished and groomed; neck is supple  CARDIOVASCULAR: Examination of carotid arteries is normal; no carotid bruits Regular rate and rhythm, no murmurs Examination of peripheral vascular system by observation and palpation is normal  EYES: Ophthalmoscopic exam of optic discs and posterior segments is normal; no papilledema or hemorrhages No results found.  MUSCULOSKELETAL: Gait, strength, tone, movements noted in Neurologic exam below  NEUROLOGIC: MENTAL STATUS:     07/21/2024    2:55 PM  MMSE - Mini Mental State Exam  Orientation to time 1  Orientation to Place 3  Registration 3  Attention/ Calculation 3  Recall 3  Language- name 2 objects 2  Language- repeat 1  Language- follow 3 step command 3  Language- read & follow direction 1  Write a sentence 1  Copy design 0  Total score 21   awake, alert, oriented to person SLOW RESPONSES DECR attention and concentration DECR FLUENCY CALM, NO COMPLAINTS; SOME DENIAL OF DEFICITS  CRANIAL NERVE:  2nd - no papilledema on fundoscopic exam 2nd, 3rd, 4th, 6th - pupils equal and reactive to light, visual fields full to confrontation, extraocular muscles intact, no nystagmus 5th - facial sensation symmetric 7th - facial strength --> CANNOT RAISE EYEBROWS; DECR LOWER FACIAL STRENGTH; LEFT WORSE THAN RIGHT 8th - hearing intact 9th - palate elevates symmetrically, uvula midline 11th - shoulder shrug symmetric 12th - tongue protrusion --> DEVIATES TO LEFT MILD DYSARTHRIA  MOTOR:  INCREASED TONE IN LUE > RUE INCREASED TONE IN LLE > RLE LEFT ARM WEAKNESS (4 PROX, 3 DISTAL); FLEXED / CONTRACTED LLE 4 PROX, 3-4 DISTAL RUE, RLE 5 normal bulk and tone, full strength in the BUE, BLE  SENSORY:  normal and symmetric to light touch, temperature, vibration  COORDINATION:  finger-nose-finger, fine finger movements SLOW ON  LEFT  REFLEXES:  deep tendon reflexes --> RUE 1, LEFT 2; KNEES 1; ANKLES ABSENT  GAIT/STATION:  narrow based gait; SHORT STEP, SPASTIC GAIT; LEFT HEMIPARETIC     DIAGNOSTIC DATA (LABS, IMAGING, TESTING) - I reviewed patient records, labs, notes, testing and imaging myself where available.  Lab Results  Component Value Date   WBC 5.0 07/15/2024   HGB 13.2 07/15/2024   HCT 41.5 07/15/2024   MCV 93.5 07/15/2024   PLT 167 07/15/2024      Component Value Date/Time   NA 144 07/08/2024 0858   K 3.9 07/08/2024 0858   CL 106 07/08/2024 0858   CO2 25 07/08/2024 0858   GLUCOSE 83 07/08/2024 0858   GLUCOSE 75 01/08/2024 1140   BUN 16 07/08/2024 0858   CREATININE 1.12 07/08/2024 0858   CREATININE 0.86 02/09/2015 1032   CALCIUM  9.0 07/08/2024 0858   CALCIUM  9.2 03/27/2008 1350   PROT 7.2 06/06/2022 1208   PROT 7.4 02/06/2018 1558   ALBUMIN 4.3 06/06/2022 1208   ALBUMIN 4.4 02/06/2018 1558   AST 41 06/06/2022 1208   ALT 35 06/06/2022 1208   ALKPHOS 68 06/06/2022 1208   BILITOT 0.4 06/06/2022 1208   BILITOT <0.2 02/06/2018 1558   GFRNONAA >60 01/08/2024 1140   GFRNONAA >89 02/09/2015 1032   GFRAA 92 03/09/2020 0903   GFRAA >89 02/09/2015 1032   Lab Results  Component Value Date   CHOL 127 04/15/2024   HDL 52 04/15/2024   LDLCALC 61 04/15/2024   TRIG 67 04/15/2024   CHOLHDL 2.4 04/15/2024   Lab Results  Component Value Date   HGBA1C 6.0 (A) 01/08/2024   No results found for: VITAMINB12 Lab Results  Component Value Date   TSH 0.189 (L) 07/08/2024    11/14/07 MRI brain (with and without) [I reviewed images myself and agree with interpretation. -VRP]  1. No acute intracranial abnormality.    2. Stable small resection cavity near the anterior commissure on the left with no evidence of residual/recurrent disease.  3. Small calcification versus chronic microhemorrhage of the left cerebellum suspected.    08/30/21 MRI brain (with and without) [I reviewed images  myself and agree with interpretation. -VRP]  1. Microangiopathic changes since 2008. 2. No recent insult or reversible finding  05/25/22 MRI brain [I reviewed images myself and agree with interpretation. -VRP]  1. No evidence of acute intracranial abnormality. 2. Similar resection site near the anterior commissure without masslike enhancement in this region. 3. Progressive microangiopathic changes with evidence of prior hemorrhages and lacunar infarcts.    ASSESSMENT AND PLAN  43 y.o. year old male here with:   Dx:  1. Attention deficit hyperactivity disorder (ADHD), unspecified ADHD type   2. Memory loss     PLAN:  MEMORY LOSS (since ~age 39 years old, suprasellar germinoma s/p treatment) - some worsening symptoms in last 5-6 months, but symptoms are long standing since 43 years old (likely related to underlying ADHD, prior brain tumor treatments, insomnia and sleep apnea)  - recommend to optimize sleep apnea treatment - recommend to get hearing testing - refer to psychiatry evaluation for ADHD treatment (previous treatment in high school)  STROKE PREVENTION - continue aspirin  81, statin, BP control  Orders Placed This Encounter  Procedures   Ambulatory referral to Psychiatry   Return for return to PCP, pending if symptoms worsen or fail to improve.    EDUARD FABIENE HANLON, MD 07/21/2024, 4:16 PM Certified in Neurology, Neurophysiology and Neuroimaging  [facility] [address], [address] [place], [place] [address] [phone]

## 2024-07-22 ENCOUNTER — Telehealth: Payer: Self-pay | Admitting: Diagnostic Neuroimaging

## 2024-07-22 NOTE — Telephone Encounter (Signed)
 Referral for psychiatry fax to [facility]. Phone: [phone], Fax: [phone]

## 2024-08-05 ENCOUNTER — Ambulatory Visit: Admitting: *Deleted

## 2024-08-05 NOTE — Progress Notes (Signed)
    Nicholas Holt presented today for blood pressure check. Patient is prescribed blood pressure medications and I confirmed that patient did take their blood pressure medication prior to today's appointment. Blood pressure was taken in the usual and appropriate manner using an automated BP cuff.     Vitals:   08/05/24 1334  BP: 120/77      Results of today's visit will be routed to Dr. Karna for review and further management.

## 2024-08-06 ENCOUNTER — Telehealth: Payer: Self-pay

## 2024-08-06 ENCOUNTER — Ambulatory Visit

## 2024-08-06 VITALS — Ht 68.0 in | Wt 198.0 lb

## 2024-08-06 DIAGNOSIS — Z0000 Encounter for general adult medical examination without abnormal findings: Secondary | ICD-10-CM | POA: Diagnosis not present

## 2024-08-06 DIAGNOSIS — R3981 Functional urinary incontinence: Secondary | ICD-10-CM

## 2024-08-06 NOTE — Addendum Note (Signed)
 Addended by: KARNA FELLOWS on: 08/06/2024 04:28 PM   Modules accepted: Orders

## 2024-08-06 NOTE — Patient Instructions (Signed)
 Nicholas Holt,  Thank you for taking the time for your Medicare Wellness Visit. I appreciate your continued commitment to your health goals. Please review the care plan we discussed, and feel free to reach out if I can assist you further.  Medicare recommends these wellness visits once per year to help you and your care team stay ahead of potential health issues. These visits are designed to focus on prevention, allowing your provider to concentrate on managing your acute and chronic conditions during your regular appointments.  Please note that Annual Wellness Visits do not include a physical exam. Some assessments may be limited, especially if the visit was conducted virtually. If needed, we may recommend a separate in-person follow-up with your provider.  Ongoing Care Seeing your primary care provider every 3 to 6 months helps us  monitor your health and provide consistent, personalized care.   Referrals If a referral was made during today's visit and you haven't received any updates within two weeks, please contact the referred provider directly to check on the status.  Recommended Screenings:  Health Maintenance  Topic Date Due   Hepatitis B Vaccine (1 of 3 - 19+ 3-dose series) Never done   HPV Vaccine (1 - Risk 3-dose SCDM series) Never done   COVID-19 Vaccine (5 - 2025-26 season) 07/14/2024   Medicare Annual Wellness Visit  08/06/2025   DTaP/Tdap/Td vaccine (3 - Td or Tdap) 06/02/2032   Flu Shot  Completed   Hepatitis C Screening  Completed   HIV Screening  Completed   Pneumococcal Vaccine  Aged Out   Meningitis B Vaccine  Aged Out       08/06/2024   11:56 AM  Advanced Directives  Does Patient Have a Medical Advance Directive? Yes  Type of Estate agent of [place];Living will  Copy of Healthcare Power of Attorney in Chart? No - copy requested   Advance Care Planning is important because it: Ensures you receive medical care that aligns with your values,  goals, and preferences. Provides guidance to your family and loved ones, reducing the emotional burden of decision-making during critical moments.  Vision: Annual vision screenings are recommended for early detection of glaucoma, cataracts, and diabetic retinopathy. These exams can also reveal signs of chronic conditions such as diabetes and high blood pressure.  Dental: Annual dental screenings help detect early signs of oral cancer, gum disease, and other conditions linked to overall health, including heart disease and diabetes.  Please see the attached documents for additional preventive care recommendations.

## 2024-08-06 NOTE — Progress Notes (Signed)
 Because this visit was a virtual/telehealth visit,  certain criteria was not obtained, such a blood pressure, CBG if applicable, and timed get up and go. Any medications not marked as taking were not mentioned during the medication reconciliation part of the visit. Any vitals not documented were not able to be obtained due to this being a telehealth visit or patient was unable to self-report a recent blood pressure reading due to a lack of equipment at home via telehealth. Vitals that have been documented are verbally provided by the patient.   Subjective:   Nicholas Holt is a 43 y.o. who presents for a Medicare Wellness preventive visit.  As a reminder, Annual Wellness Visits don't include a physical exam, and some assessments may be limited, especially if this visit is performed virtually. We may recommend an in-person follow-up visit with your provider if needed.  Visit Complete: Virtual I connected with  Nicholas Holt on 08/06/24 by a audio enabled telemedicine application and verified that I am speaking with the correct person using two identifiers.  Patient Location: Home  Provider Location: Office/Clinic  I discussed the limitations of evaluation and management by telemedicine. The patient expressed understanding and agreed to proceed.  Vital Signs: Because this visit was a virtual/telehealth visit, some criteria may be missing or patient reported. Any vitals not documented were not able to be obtained and vitals that have been documented are patient reported.  VideoDeclined- This patient declined Librarian, academic. Therefore the visit was completed with audio only.  Persons Participating in Visit: Patient assisted by Mother.  AWV Questionnaire: No: Patient Medicare AWV questionnaire was not completed prior to this visit.  Cardiac Risk Factors include: dyslipidemia;family history of premature cardiovascular disease;hypertension;male  gender;sedentary lifestyle;obesity (BMI >30kg/m2)     Objective:    Today's Vitals   08/06/24 1153  Weight: 198 lb (89.8 kg)  Height: 5' 8 (1.727 m)  PainSc: 0-No pain   Body mass index is 30.11 kg/m.     08/06/2024   11:56 AM 07/08/2024    8:21 AM 04/15/2024    8:32 AM 10/08/2023   10:25 AM 09/14/2023    9:35 AM 04/23/2023    3:13 PM 04/23/2023    9:34 AM  Advanced Directives  Does Patient Have a Medical Advance Directive? Yes Yes Yes Yes Yes Yes Yes  Type of Estate agent of [place];Living will Healthcare Power of [place];Living will Healthcare Power of [place];Living will Healthcare Power of [place];Living will Healthcare Power of [place];Living will Healthcare Power of [place];Living will Healthcare Power of [place];Living will  Does patient want to make changes to medical advance directive?  No - Patient declined No - Patient declined No - Patient declined  No - Patient declined No - Patient declined  Copy of Healthcare Power of Attorney in Chart? No - copy requested No - copy requested No - copy requested No - copy requested No - copy requested No - copy requested No - copy requested  Would patient like information on creating a medical advance directive?      No - Patient declined     Current Medications (verified) Outpatient Encounter Medications as of 08/06/2024  Medication Sig   Apoaequorin (PREVAGEN) 10 MG CAPS 1 capsule Orally once a day   ascorbic acid (VITAMIN C) 1000 MG tablet 2 tablets Orally Once a day   aspirin  81 MG chewable tablet CHEW AND SWALLOW 1 TABLET(81 MG) BY MOUTH DAILY   Cyanocobalamin (B-12 PO) Take  by mouth.   desmopressin  (DDAVP  NASAL) 0.01 % solution USE ONE SPRAY IN EACH NOSTRIL FOUR TIMES DAILY   hydrocortisone  (CORTEF ) 5 MG tablet 1 tablet in early morning Orally Once a day but double the dose on sick days   levothyroxine  (SYNTHROID ) 100 MCG tablet Take 100 mcg by mouth daily before breakfast.   LORazepam  (ATIVAN ) 2 MG  tablet TAKE 3 TABLETS(6 MG) BY MOUTH AT BEDTIME   losartan  (COZAAR ) 25 MG tablet TAKE 1 TABLET(25 MG) BY MOUTH DAILY   melatonin 3 MG TABS tablet Take 1 tablet (3 mg total) by mouth at bedtime.   Multiple Vitamin (MULTI VITAMIN) TABS 1 tablet Orally Once a day   potassium chloride  (KLOR-CON ) 10 MEQ tablet Take 2 tablets (20 mEq total) by mouth daily.   rosuvastatin  (CRESTOR ) 20 MG tablet TAKE 1 TABLET(20 MG) BY MOUTH DAILY   Testosterone  (ANDROGEL  PUMP) 20.25 MG/ACT (1.62%) GEL Place 20.25 mg onto the skin daily. Apply 6 pumps 3 pumps per side) topically to shoulders or upper back once a day   No facility-administered encounter medications on file as of 08/06/2024.    Allergies (verified) Patient has no known allergies.   History: Past Medical History:  Diagnosis Date   Anxiety    Attention deficit disorder 08/08/2006   Bilateral leg cramps 11/25/2006   Blood transfusion without reported diagnosis 1997   Chemotherapy associated   Brain cancer (HCC) 1998   Cough 12/08/2015   COVID-19 10/16/2017   Elevated hemidiaphragm 07/09/2015   Left, presumably after portacath placement.    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  Complicated by left sided motor tic disorder and panhypopituitarism.    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  Includes central diabetes insipidus, secondary hypothyroidism, secondary adrenal insufficiency, and secondary hypogonadism.    Perennial allergic rhinitis 07/09/2015    Pre-diabetes    no meds   Preventative health care 07/09/2015   Seizure disorder (HCC) 08/08/2006   Predated brain tumor.  Generalized seizure ocurred after running out of lorazepam  post-tumor.    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 Treated with alendronate  from 2009-2016. DEXA (03/16/2008): L Spine T -2.0. L fem neck T -0.9, R fem neck T -1.9.  DEXA (10/15/2014): L Spine T -1.3, L fem neck T -0.6, R fem neck T -1.8.  Bisphosphonate holiday 06/2015.  Repeat DEXA scan in 10/2016 to reassess.    Weakness 09/15/2023   Past Surgical History:  Procedure Laterality Date   BRAIN BIOPSY  11/14/1995   CSF SHUNT  11/14/1995   PORTACATH PLACEMENT     portacath removal     RADIOLOGY WITH ANESTHESIA N/A 08/30/2021   Procedure: MRI WITH ANESTHESIA BRAIN WITH AND WITHOUT CONTRAST;  Surgeon: Radiologist, Medication, MD;  Location: [facility] OR;  Service: Radiology;  Laterality: N/A;   RADIOLOGY WITH ANESTHESIA N/A 05/25/2022   Procedure: MRI WITH ANESTHESIA OF BRAIN WITH AND WITHOUT CONTRAST;  Surgeon: Radiologist, Medication, MD;  Location: [facility] OR;  Service: Radiology;  Laterality: N/A;   Family History  Problem Relation Age  of Onset   Hypertension Mother    Hypertension Father    Hyperlipidemia Father    Prostate cancer Father    Diabetes type I Sister        Died of complications of diabetes in her 75's   Sleep apnea Maternal Aunt    Social History   Socioeconomic History   Marital status: Single    Spouse name: Not on file   Number of children: Not on file   Years of education: Not on file   Highest education level: Not on file  Occupational History   Not on file  Tobacco Use   Smoking status: Never    Passive exposure: Never   Smokeless tobacco: Never  Vaping Use   Vaping status: Never Used  Substance and Sexual Activity   Alcohol use: No    Alcohol/week: 0.0 standard drinks of alcohol   Drug use: No   Sexual activity: Not on file  Other Topics  Concern   Not on file  Social History Narrative   Lives with parents.  Parents retired. Father or mother take him to appointments.  Mother sends notes if she can not take him.   Social Drivers of Corporate investment banker Strain: Low Risk  (08/06/2024)   Overall Financial Resource Strain (CARDIA)    Difficulty of Paying Living Expenses: Not hard at all  Food Insecurity: No Food Insecurity (08/06/2024)   Hunger Vital Sign    Worried About Running Out of Food in the Last Year: Never true    Ran Out of Food in the Last Year: Never true  Transportation Needs: No Transportation Needs (08/06/2024)   PRAPARE - Administrator, Civil Service (Medical): No    Lack of Transportation (Non-Medical): No  Physical Activity: Inactive (08/06/2024)   Exercise Vital Sign    Days of Exercise per Week: 0 days    Minutes of Exercise per Session: 0 min  Stress: No Stress Concern Present (08/06/2024)   [employer] of Occupational Health - Occupational Stress Questionnaire    Feeling of Stress: Not at all  Social Connections: Patient Declined (08/06/2024)   Social Connection and Isolation Panel    Frequency of Communication with Friends and Family: Patient declined    Frequency of Social Gatherings with Friends and Family: Patient declined    Attends Religious Services: Patient declined    Database administrator or Organizations: Patient declined    Attends Engineer, structural: Patient declined    Marital Status: Patient declined    Tobacco Counseling Counseling given: Not Answered    Clinical Intake:  Pre-visit preparation completed: Yes  Pain : No/denies pain Pain Score: 0-No pain     BMI - recorded: 30.11 Nutritional Status: BMI > 30  Obese Nutritional Risks: None Diabetes: No  Lab Results  Component Value Date   HGBA1C 6.0 (A) 01/08/2024   HGBA1C 5.8 (H) 10/10/2022   HGBA1C 5.8 (H) 05/29/2022     How often do you need to have someone help you when you  read instructions, pamphlets, or other written materials from your doctor or pharmacy?: 1 - Never What is the last grade level you completed in school?: 12TH GRADE  Interpreter Needed?: No  Information entered by :: Kyasia Steuck N. Heston Widener, LPN.   Activities of Daily Living     08/06/2024   12:00 PM 09/14/2023    9:35 AM  In your present state of health, do you have any difficulty performing the  following activities:  Hearing? 0 0  Vision? 0 0  Difficulty concentrating or making decisions? 0 0  Walking or climbing stairs? 0 0  Dressing or bathing? 0 0  Doing errands, shopping? 0 0  Preparing Food and eating ? N   Using the Toilet? N   In the past six months, have you accidently leaked urine? Y   Do you have problems with loss of bowel control? Y   Managing your Medications? Y   Managing your Finances? N   Housekeeping or managing your Housekeeping? N     Patient Care Team: Karna Fellows, MD as PCP - General (Internal Medicine) Claudene Muskrat, OD as Consulting Physician (Optometry)  I have updated your Care Teams any recent Medical Services you may have received from other providers in the past year.     Assessment:   This is a routine wellness examination for Jameon.  Hearing/Vision screen Hearing Screening - Comments:: Denies hearing difficulties.  Vision Screening - Comments:: Wears rx glasses - up to date with routine eye exams with [facility]    Goals Addressed             This Visit's Progress    Client understands the importance of follow-up with providers by attending scheduled visits         Depression Screen     08/06/2024   11:57 AM 07/08/2024    8:20 AM 03/31/2024    2:38 PM 01/08/2024   11:03 AM 09/14/2023    9:35 AM 04/23/2023    3:14 PM 04/23/2023    3:12 PM  PHQ 2/9 Scores  PHQ - 2 Score 0 0 0 0 0 0 0  PHQ- 9 Score 0          Fall Risk     08/06/2024   11:57 AM 07/08/2024    8:20 AM 04/15/2024    8:22 AM 03/31/2024    2:38 PM  01/08/2024   11:03 AM  Fall Risk   Falls in the past year? 0 0 0 0 0  Number falls in past yr: 0 0 0 0 0  Injury with Fall? 0 0 0 0 0  Risk for fall due to : Impaired mobility Impaired mobility Impaired mobility Impaired mobility No Fall Risks  Follow up Falls evaluation completed Falls prevention discussed Falls prevention discussed  Falls evaluation completed    MEDICARE RISK AT HOME:  Medicare Risk at Home Any stairs in or around the home?: Yes If so, are there any without handrails?: No Home free of loose throw rugs in walkways, pet beds, electrical cords, etc?: Yes Adequate lighting in your home to reduce risk of falls?: Yes Use of a cane, walker or w/c?: No Grab bars in the bathroom?: Yes Shower chair or bench in shower?: Yes Elevated toilet seat or a handicapped toilet?: Yes  TIMED UP AND GO:  Was the test performed?  No  Cognitive Function: Declined/Normal: No cognitive concerns noted by patient or family. Patient alert, oriented, able to answer questions appropriately and recall recent events. No signs of memory loss or confusion.    07/21/2024    2:55 PM  MMSE - Mini Mental State Exam  Orientation to time 1  Orientation to Place 3  Registration 3  Attention/ Calculation 3  Recall 3  Language- name 2 objects 2  Language- repeat 1  Language- follow 3 step command 3  Language- read & follow direction 1  Write a sentence 1  Copy design  0  Total score 21        08/06/2024   12:14 PM 03/17/2022   12:06 PM  6CIT Screen  What Year? 0 points 0 points  What month? 0 points 0 points  What time? 0 points 0 points  Count back from 20 0 points 0 points  Months in reverse 0 points 0 points  Repeat phrase 0 points 0 points  Total Score 0 points 0 points    Immunizations Immunization History  Administered Date(s) Administered   Influenza Split 08/10/2011, 10/03/2012, 08/28/2014   Influenza Whole 09/05/2006, 09/04/2007, 09/07/2009, 09/02/2010   Influenza,inj,Quad  PF,6+ Mos 09/17/2013, 07/09/2015, 08/19/2019, 08/15/2021   Influenza-Unspecified 06/13/2017, 07/10/2024   PFIZER(Purple Top)SARS-COV-2 Vaccination 01/11/2020, 02/10/2020, 08/28/2020   Pneumococcal Conjugate-13 11/24/2016   Tdap 08/10/2011, 06/02/2022   Unspecified SARS-COV-2 Vaccination 08/01/2022    Screening Tests Health Maintenance  Topic Date Due   Hepatitis B Vaccines 19-59 Average Risk (1 of 3 - 19+ 3-dose series) Never done   HPV VACCINES (1 - Risk 3-dose SCDM series) Never done   COVID-19 Vaccine (5 - 2025-26 season) 07/14/2024   Medicare Annual Wellness (AWV)  08/06/2025   DTaP/Tdap/Td (3 - Td or Tdap) 06/02/2032   Influenza Vaccine  Completed   Hepatitis C Screening  Completed   HIV Screening  Completed   Pneumococcal Vaccine  Aged Out   Meningococcal B Vaccine  Aged Out    Health Maintenance Items Addressed: Yes Patient and mother aware of current care gaps.  Immunization record was verified by [facility] and updated in patient's chart.  Additional Screening:  Vision Screening: Recommended annual ophthalmology exams for early detection of glaucoma and other disorders of the eye. Is the patient up to date with their annual eye exam?  Yes  Who is the provider or what is the name of the office in which the patient attends annual eye exams? Nanetta Sharps, OD  with [facility]  Dental Screening: Recommended annual dental exams for proper oral hygiene  Community Resource Referral / Chronic Care Management: CRR required this visit?  No   CCM required this visit?  No   Plan:    I have personally reviewed and noted the following in the patient's chart:   Medical and social history Use of alcohol, tobacco or illicit drugs  Current medications and supplements including opioid prescriptions. Patient is not currently taking opioid prescriptions. Functional ability and status Nutritional status Physical activity Advanced directives List of other  physicians Hospitalizations, surgeries, and ER visits in previous 12 months Vitals Screenings to include cognitive, depression, and falls Referrals and appointments  In addition, I have reviewed and discussed with patient certain preventive protocols, quality metrics, and best practice recommendations. A written personalized care plan for preventive services as well as general preventive health recommendations were provided to patient.   Roz LOISE Fuller, LPN   [id number]   After Visit Summary: (Declined) Due to this being a telephonic visit, with patients personalized plan was offered to patient but patient Declined AVS at this time   Notes: Please refer to Routing Comments.

## 2024-08-06 NOTE — Telephone Encounter (Signed)
 Patient's mother stated during AWV today, that she would like to inquire about the paperwork for the incontinence supplies.  Mother stated that the Synthroid  medication is what's causing the excessive diarrhea and he has been taking Imodium.  Just checking on the status of paperwork.  Christion Leonhard N. Tomie, LPN [facility] Annual Wellness Team Direct Dial: [phone]

## 2024-08-07 ENCOUNTER — Other Ambulatory Visit: Payer: Self-pay | Admitting: Internal Medicine

## 2024-08-07 DIAGNOSIS — R252 Cramp and spasm: Secondary | ICD-10-CM

## 2024-08-07 NOTE — Telephone Encounter (Signed)
 Using cyclobenzaprine  5 mg prn at night for muscle spasms (see visit 12/2023).

## 2024-08-08 NOTE — Telephone Encounter (Signed)
 Pt's mother called stating that she has been having trouble getting an appt for the pt at [facility]. Mother states they want everything done online and she is not able to do it. Please advise.

## 2024-08-11 ENCOUNTER — Telehealth: Payer: Self-pay | Admitting: *Deleted

## 2024-08-11 NOTE — Telephone Encounter (Unsigned)
 Copied from CRM [id number]. Topic: General - Other >> Aug 11, 2024  2:29 PM Antonio H wrote: Reason for CRM: Patient's mom Nocomusw, called in wanting to speak with LPN Roz Fuller, stated they talked about something on 9/24 that she needs to speak with her about again. She did not provide details. Call back number is [phone]

## 2024-08-14 NOTE — Telephone Encounter (Signed)
 Called Marcie at  [facility]  to Follow up about Pt referral  ,No Aanswer  , LVM  For Marcie to give office callback

## 2024-08-15 DIAGNOSIS — E230 Hypopituitarism: Secondary | ICD-10-CM | POA: Diagnosis not present

## 2024-08-15 DIAGNOSIS — E232 Diabetes insipidus: Secondary | ICD-10-CM | POA: Diagnosis not present

## 2024-08-19 DIAGNOSIS — E230 Hypopituitarism: Secondary | ICD-10-CM | POA: Diagnosis not present

## 2024-08-19 DIAGNOSIS — R197 Diarrhea, unspecified: Secondary | ICD-10-CM | POA: Diagnosis not present

## 2024-08-19 DIAGNOSIS — R7303 Prediabetes: Secondary | ICD-10-CM | POA: Diagnosis not present

## 2024-08-19 DIAGNOSIS — E232 Diabetes insipidus: Secondary | ICD-10-CM | POA: Diagnosis not present

## 2024-08-19 DIAGNOSIS — E785 Hyperlipidemia, unspecified: Secondary | ICD-10-CM | POA: Diagnosis not present

## 2024-08-22 DIAGNOSIS — E870 Hyperosmolality and hypernatremia: Secondary | ICD-10-CM | POA: Diagnosis not present

## 2024-08-22 DIAGNOSIS — E230 Hypopituitarism: Secondary | ICD-10-CM | POA: Diagnosis not present

## 2024-08-28 DIAGNOSIS — G4733 Obstructive sleep apnea (adult) (pediatric): Secondary | ICD-10-CM | POA: Diagnosis not present

## 2024-09-04 DIAGNOSIS — G4733 Obstructive sleep apnea (adult) (pediatric): Secondary | ICD-10-CM | POA: Diagnosis not present

## 2024-09-04 DIAGNOSIS — I10 Essential (primary) hypertension: Secondary | ICD-10-CM | POA: Diagnosis not present

## 2024-09-04 DIAGNOSIS — N179 Acute kidney failure, unspecified: Secondary | ICD-10-CM | POA: Diagnosis not present

## 2024-09-05 ENCOUNTER — Telehealth: Payer: Self-pay | Admitting: Neurology

## 2024-09-05 DIAGNOSIS — Z789 Other specified health status: Secondary | ICD-10-CM

## 2024-09-05 DIAGNOSIS — G4733 Obstructive sleep apnea (adult) (pediatric): Secondary | ICD-10-CM

## 2024-09-05 NOTE — Telephone Encounter (Signed)
 Patient's mother, Nocomus Pandit calling too much air coming in. Contacted [facility] and they said need to call the provider to have them send a prescription to decrease the pressure.   Dr. Margaret referred to [facility] and they do not take Medicaid. Need referral sent that would take insurance.

## 2024-09-08 NOTE — Telephone Encounter (Signed)
 LVM made pt aware that Dr Buck changing air pressure and sending orders to DME this afternoon

## 2024-09-08 NOTE — Telephone Encounter (Signed)
 We can reduce autoPAP to 4-8 cm. Order placed.

## 2024-09-09 ENCOUNTER — Other Ambulatory Visit: Payer: Self-pay | Admitting: Internal Medicine

## 2024-09-09 ENCOUNTER — Telehealth: Payer: Self-pay | Admitting: Internal Medicine

## 2024-09-09 DIAGNOSIS — F4322 Adjustment disorder with anxiety: Secondary | ICD-10-CM

## 2024-09-09 DIAGNOSIS — G40909 Epilepsy, unspecified, not intractable, without status epilepticus: Secondary | ICD-10-CM

## 2024-09-09 MED ORDER — FLUTICASONE PROPIONATE 50 MCG/ACT NA SUSP: 1.0000 | Freq: Every day | NASAL | 0 refills | Status: DC

## 2024-09-09 NOTE — Telephone Encounter (Signed)
 Community message has been sent to [facility]  for pressure adjustment on 09/09/24. DD

## 2024-09-09 NOTE — Telephone Encounter (Signed)
>>   Sep 09, 2024  1:29 PM Suzette B wrote: Medication: fluticasone  (FLONASE ) 50 MCG/ACT nasal spray Place 1 spray into both nostrils daily. Dispense: 16 g, Refills: 0 ordered12/04/201801/31/2019       Has the patient contacted their pharmacy? Yes Patient's mother was told that the prescription was old and would need to call his pcp and request a new prescription for this medication    This is the patient's preferred pharmacy:  [id number] - [place], [place] - [address] AT [address] [place] [address]  Phone: [phone] Fax: [phone]

## 2024-09-09 NOTE — Telephone Encounter (Unsigned)
 Copied from CRM [id number]. Topic: Clinical - Medication Refill >> Sep 09, 2024  1:29 PM Suzette B wrote: Medication: fluticasone  (FLONASE ) 50 MCG/ACT nasal spray Place 1 spray into both nostrils daily. Dispense: 16 g, Refills: 0 ordered 10/16/2017 12/13/2017    Has the patient contacted their pharmacy? Yes Patient's mother was told that the prescription was old and would need to call his pcp and request a new prescription for this medication   This is the patient's preferred pharmacy:  [facility] [address] [place] - [address] [place] [address]  Phone: [phone] Fax: [phone]   Is this the correct pharmacy for this prescription? Yes If no, delete pharmacy and type the correct one.   Has the prescription been filled recently? No  Is the patient out of the medication? Yes  Has the patient been seen for an appointment in the last year OR does the patient have an upcoming appointment? Yes  Can we respond through MyChart? No  Agent: Please be advised that Rx refills may take up to 3 business days. We ask that you follow-up with your pharmacy.

## 2024-09-23 ENCOUNTER — Ambulatory Visit: Payer: Self-pay | Admitting: Internal Medicine

## 2024-09-24 NOTE — Telephone Encounter (Signed)
   Fyi   Pt called stating that [facility] call and stated that pt referral Is OON with OFFICE ,  Informed Pt that referral will be sent some where else and will follow up with PT

## 2024-10-02 ENCOUNTER — Encounter: Payer: Self-pay | Admitting: Podiatry

## 2024-10-02 ENCOUNTER — Ambulatory Visit (INDEPENDENT_AMBULATORY_CARE_PROVIDER_SITE_OTHER): Admitting: Podiatry

## 2024-10-02 VITALS — Ht 68.0 in | Wt 198.0 lb

## 2024-10-02 DIAGNOSIS — M216X2 Other acquired deformities of left foot: Secondary | ICD-10-CM

## 2024-10-02 DIAGNOSIS — M2041 Other hammer toe(s) (acquired), right foot: Secondary | ICD-10-CM

## 2024-10-02 DIAGNOSIS — M79674 Pain in right toe(s): Secondary | ICD-10-CM | POA: Diagnosis not present

## 2024-10-02 DIAGNOSIS — M79675 Pain in left toe(s): Secondary | ICD-10-CM

## 2024-10-02 DIAGNOSIS — B351 Tinea unguium: Secondary | ICD-10-CM

## 2024-10-02 DIAGNOSIS — M21619 Bunion of unspecified foot: Secondary | ICD-10-CM | POA: Diagnosis not present

## 2024-10-02 DIAGNOSIS — M216X1 Other acquired deformities of right foot: Secondary | ICD-10-CM

## 2024-10-02 NOTE — Progress Notes (Signed)
 Subjective:   Patient ID: Nicholas Holt, male   DOB: 43 y.o.   MRN: [id number]   HPI Chief Complaint  Patient presents with   Nail Problem    Pt is here for RFC.    43 year old male presents the office today with concerns.  He presents today with his mom.  He does not report any open lesions but his feet are painful particular in the balls of his feet.  He does not report any recent injuries or open wounds.   Review of Systems  All other systems reviewed and are negative.       Objective:  Physical Exam  General: AAO x3, NAD  Dermatological: Nails are hypertrophic, dystrophic, brittle, discolored, elongated 10. No surrounding redness or drainage. Tenderness nails 1-5 bilaterally.  Hyperkeratotic lesions noted submetatarsal as well as lateral fifth toe.   Vascular: Dorsalis Pedis artery and Posterior Tibial artery pedal pulses are 2/4 bilateral with immedate capillary fill time. There is no pain with calf compression, swelling, warmth, erythema.   Neruologic: Grossly intact via light touch bilateral.   Musculoskeletal: Bunion deformities present bilaterally with hammertoe contractures which are rigid.  Hyperkeratotic lesions of the lateral asp of the fifth toe as well as submetatarsal given prominent metatarsal heads.  He has tenderness palpation submetatarsal.       Assessment:   43 year old male with bunion, hammertoe deformities with prominent metatarsal heads resulting hyperkeratotic lesions; symptomatic onychomycosis     Plan:  Symptomatic onychomycosis - Sharply debrided nails x 10 without any complications or bleeding  Bunion, hammertoe deformities with prominent metatarsal heads -This resulted in hyperkeratotic lesions.  As a courtesy debride that any complications or bleeding.  Discussed moisturizer dispensed offloading metatarsal pads.  Discussed shoes with good arch support.  Donnice JONELLE Fees DPM

## 2024-10-03 ENCOUNTER — Other Ambulatory Visit: Payer: Self-pay

## 2024-10-03 ENCOUNTER — Encounter (HOSPITAL_COMMUNITY): Payer: Self-pay | Admitting: *Deleted

## 2024-10-03 ENCOUNTER — Inpatient Hospital Stay (HOSPITAL_COMMUNITY)
Admission: EM | Admit: 2024-10-03 | Discharge: 2024-10-10 | DRG: 065 | Disposition: A | Discharge status: Rehabilitation Facility | Attending: Internal Medicine | Admitting: Internal Medicine

## 2024-10-03 ENCOUNTER — Emergency Department (HOSPITAL_COMMUNITY)

## 2024-10-03 DIAGNOSIS — G9340 Encephalopathy, unspecified: Principal | ICD-10-CM

## 2024-10-03 DIAGNOSIS — R059 Cough, unspecified: Secondary | ICD-10-CM | POA: Diagnosis present

## 2024-10-03 DIAGNOSIS — G9389 Other specified disorders of brain: Secondary | ICD-10-CM | POA: Diagnosis present

## 2024-10-03 DIAGNOSIS — Z8616 Personal history of COVID-19: Secondary | ICD-10-CM

## 2024-10-03 DIAGNOSIS — R269 Unspecified abnormalities of gait and mobility: Secondary | ICD-10-CM | POA: Diagnosis not present

## 2024-10-03 DIAGNOSIS — Z833 Family history of diabetes mellitus: Secondary | ICD-10-CM

## 2024-10-03 DIAGNOSIS — E870 Hyperosmolality and hypernatremia: Secondary | ICD-10-CM | POA: Diagnosis present

## 2024-10-03 DIAGNOSIS — Z79899 Other long term (current) drug therapy: Secondary | ICD-10-CM

## 2024-10-03 DIAGNOSIS — I69354 Hemiplegia and hemiparesis following cerebral infarction affecting left non-dominant side: Secondary | ICD-10-CM | POA: Diagnosis present

## 2024-10-03 DIAGNOSIS — Z79890 Hormone replacement therapy (postmenopausal): Secondary | ICD-10-CM

## 2024-10-03 DIAGNOSIS — I69393 Ataxia following cerebral infarction: Secondary | ICD-10-CM | POA: Diagnosis not present

## 2024-10-03 DIAGNOSIS — G40909 Epilepsy, unspecified, not intractable, without status epilepticus: Secondary | ICD-10-CM | POA: Diagnosis present

## 2024-10-03 DIAGNOSIS — Z9221 Personal history of antineoplastic chemotherapy: Secondary | ICD-10-CM

## 2024-10-03 DIAGNOSIS — E230 Hypopituitarism: Secondary | ICD-10-CM | POA: Diagnosis present

## 2024-10-03 DIAGNOSIS — R471 Dysarthria and anarthria: Secondary | ICD-10-CM | POA: Diagnosis present

## 2024-10-03 DIAGNOSIS — Z6830 Body mass index (BMI) 30.0-30.9, adult: Secondary | ICD-10-CM

## 2024-10-03 DIAGNOSIS — F419 Anxiety disorder, unspecified: Secondary | ICD-10-CM | POA: Diagnosis present

## 2024-10-03 DIAGNOSIS — E039 Hypothyroidism, unspecified: Secondary | ICD-10-CM | POA: Diagnosis present

## 2024-10-03 DIAGNOSIS — R7303 Prediabetes: Secondary | ICD-10-CM | POA: Diagnosis present

## 2024-10-03 DIAGNOSIS — K5900 Constipation, unspecified: Secondary | ICD-10-CM | POA: Diagnosis present

## 2024-10-03 DIAGNOSIS — R748 Abnormal levels of other serum enzymes: Secondary | ICD-10-CM | POA: Diagnosis present

## 2024-10-03 DIAGNOSIS — Z7982 Long term (current) use of aspirin: Secondary | ICD-10-CM

## 2024-10-03 DIAGNOSIS — R262 Difficulty in walking, not elsewhere classified: Secondary | ICD-10-CM | POA: Diagnosis present

## 2024-10-03 DIAGNOSIS — Z923 Personal history of irradiation: Secondary | ICD-10-CM

## 2024-10-03 DIAGNOSIS — I10 Essential (primary) hypertension: Secondary | ICD-10-CM | POA: Diagnosis present

## 2024-10-03 DIAGNOSIS — G8194 Hemiplegia, unspecified affecting left nondominant side: Secondary | ICD-10-CM | POA: Diagnosis present

## 2024-10-03 DIAGNOSIS — D72829 Elevated white blood cell count, unspecified: Secondary | ICD-10-CM | POA: Diagnosis present

## 2024-10-03 DIAGNOSIS — Z8249 Family history of ischemic heart disease and other diseases of the circulatory system: Secondary | ICD-10-CM

## 2024-10-03 DIAGNOSIS — E669 Obesity, unspecified: Secondary | ICD-10-CM | POA: Diagnosis present

## 2024-10-03 DIAGNOSIS — I69322 Dysarthria following cerebral infarction: Secondary | ICD-10-CM | POA: Diagnosis not present

## 2024-10-03 DIAGNOSIS — Z8042 Family history of malignant neoplasm of prostate: Secondary | ICD-10-CM

## 2024-10-03 DIAGNOSIS — M24522 Contracture, left elbow: Secondary | ICD-10-CM | POA: Diagnosis present

## 2024-10-03 DIAGNOSIS — M24532 Contracture, left wrist: Secondary | ICD-10-CM | POA: Diagnosis present

## 2024-10-03 DIAGNOSIS — Z83438 Family history of other disorder of lipoprotein metabolism and other lipidemia: Secondary | ICD-10-CM

## 2024-10-03 DIAGNOSIS — I6381 Other cerebral infarction due to occlusion or stenosis of small artery: Principal | ICD-10-CM | POA: Diagnosis present

## 2024-10-03 DIAGNOSIS — Z85841 Personal history of malignant neoplasm of brain: Secondary | ICD-10-CM

## 2024-10-03 DIAGNOSIS — G4733 Obstructive sleep apnea (adult) (pediatric): Secondary | ICD-10-CM | POA: Diagnosis present

## 2024-10-03 DIAGNOSIS — E785 Hyperlipidemia, unspecified: Secondary | ICD-10-CM | POA: Diagnosis present

## 2024-10-03 LAB — CBG MONITORING, ED: Glucose-Capillary: 95 mg/dL (ref 70–99)

## 2024-10-03 LAB — URINALYSIS, ROUTINE W REFLEX MICROSCOPIC
Bilirubin Urine: NEGATIVE
Glucose, UA: NEGATIVE mg/dL
Hgb urine dipstick: NEGATIVE
Ketones, ur: NEGATIVE mg/dL
Leukocytes,Ua: NEGATIVE
Nitrite: NEGATIVE
Protein, ur: NEGATIVE mg/dL
Specific Gravity, Urine: 1.017 (ref 1.005–1.030)
pH: 8 (ref 5.0–8.0)

## 2024-10-03 LAB — CBC WITH DIFFERENTIAL/PLATELET
Abs Immature Granulocytes: 0.02 K/uL (ref 0.00–0.07)
Basophils Absolute: 0 K/uL (ref 0.0–0.1)
Basophils Relative: 0 %
Eosinophils Absolute: 0.1 K/uL (ref 0.0–0.5)
Eosinophils Relative: 1 %
HCT: 40.4 % (ref 39.0–52.0)
Hemoglobin: 13.1 g/dL (ref 13.0–17.0)
Immature Granulocytes: 0 %
Lymphocytes Relative: 34 %
Lymphs Abs: 2 K/uL (ref 0.7–4.0)
MCH: 29.8 pg (ref 26.0–34.0)
MCHC: 32.4 g/dL (ref 30.0–36.0)
MCV: 92 fL (ref 80.0–100.0)
Monocytes Absolute: 0.4 K/uL (ref 0.1–1.0)
Monocytes Relative: 7 %
Neutro Abs: 3.3 K/uL (ref 1.7–7.7)
Neutrophils Relative %: 58 %
Platelets: 173 K/uL (ref 150–400)
RBC: 4.39 MIL/uL (ref 4.22–5.81)
RDW: 14.7 % (ref 11.5–15.5)
WBC: 5.8 K/uL (ref 4.0–10.5)
nRBC: 0 % (ref 0.0–0.2)

## 2024-10-03 LAB — COMPREHENSIVE METABOLIC PANEL WITH GFR
ALT: 36 U/L (ref 0–44)
AST: 36 U/L (ref 15–41)
Albumin: 4.6 g/dL (ref 3.5–5.0)
Alkaline Phosphatase: 65 U/L (ref 38–126)
Anion gap: 8 (ref 5–15)
BUN: 12 mg/dL (ref 6–20)
CO2: 29 mmol/L (ref 22–32)
Calcium: 9.4 mg/dL (ref 8.9–10.3)
Chloride: 100 mmol/L (ref 98–111)
Creatinine, Ser: 1.22 mg/dL (ref 0.61–1.24)
GFR, Estimated: >60 mL/min (ref >60)
Glucose, Bld: 87 mg/dL (ref 70–99)
Potassium: 3.7 mmol/L (ref 3.5–5.1)
Sodium: 136 mmol/L (ref 135–145)
Total Bilirubin: 0.4 mg/dL (ref 0.0–1.2)
Total Protein: 7.6 g/dL (ref 6.5–8.1)

## 2024-10-03 LAB — SALICYLATE LEVEL: Salicylate Lvl: <7 mg/dL — ABNORMAL LOW (ref 7.0–30.0)

## 2024-10-03 LAB — ACETAMINOPHEN LEVEL: Acetaminophen (Tylenol), Serum: <10 ug/mL — ABNORMAL LOW (ref 10–30)

## 2024-10-03 MED ORDER — ONDANSETRON HCL 4 MG/2ML IJ SOLN: 4.0000 mg | Freq: Four times a day (QID) | INTRAMUSCULAR | Status: DC | PRN

## 2024-10-03 MED ORDER — ONDANSETRON HCL 4 MG PO TABS: 4.0000 mg | ORAL_TABLET | Freq: Four times a day (QID) | ORAL | Status: DC | PRN

## 2024-10-03 MED ORDER — LORAZEPAM 2 MG/ML IJ SOLN: 2.0000 mg | Freq: Once | INTRAMUSCULAR | Status: DC | PRN

## 2024-10-03 MED ORDER — LORAZEPAM 1 MG PO TABS
4.0000 mg | ORAL_TABLET | Freq: Once | ORAL | Status: AC
  Administered 2024-10-03: 4 mg via ORAL
  Filled 2024-10-03: qty 4

## 2024-10-03 MED ORDER — BISACODYL 5 MG PO TBEC
5.0000 mg | DELAYED_RELEASE_TABLET | Freq: Every day | ORAL | Status: DC | PRN
  Administered 2024-10-09: 5 mg via ORAL
  Filled 2024-10-03: qty 1

## 2024-10-03 MED ORDER — SODIUM CHLORIDE 0.9% FLUSH
3.0000 mL | Freq: Two times a day (BID) | INTRAVENOUS | Status: DC
  Administered 2024-10-03 – 2024-10-09 (×12): 3 mL via INTRAVENOUS

## 2024-10-03 MED ORDER — ACETAMINOPHEN 325 MG PO TABS
650.0000 mg | ORAL_TABLET | Freq: Four times a day (QID) | ORAL | Status: DC | PRN
  Administered 2024-10-10: 650 mg via ORAL
  Filled 2024-10-03 (×2): qty 2

## 2024-10-03 MED ORDER — ACETAMINOPHEN 650 MG RE SUPP: 650.0000 mg | Freq: Four times a day (QID) | RECTAL | Status: DC | PRN

## 2024-10-03 NOTE — ED Notes (Signed)
 Pt and family asking that kirk franklin be played during pt MRI for pt comfort.

## 2024-10-03 NOTE — ED Triage Notes (Addendum)
 BIB family from home for ataxia and change in speech. PCP instructed to bring to ED. Sx onset yesterday, progressively worse. Family mentions his DDAVP  spray recently changed to tablet. H/o diabetes insipidus and hypothyroid. Alert, NAD, calm, interactive, resps e/u, cooperative, follows directions, answering questions. Sitting in w/c. Denies recent illness, fever, NV, sob, pain. Endorses some diarrhea.

## 2024-10-03 NOTE — ED Notes (Signed)
 2 IV attempted

## 2024-10-03 NOTE — ED Provider Notes (Signed)
 [place] EMERGENCY DEPARTMENT AT [facility] Provider Note   CSN: [id number] Arrival date & time: 10/03/24  1627     Patient presents with: Gait Problem   Nicholas Holt is a 43 y.o. male.   PMH of panhypopituitaryism secondary to brain cancer, hypertension, seizure disorder, HLD presenting with 2 days of progressive weakness.  Per his mother at baseline he is able to ambulate unassisted, but over the last 2 days she has noticed that he has been slower and has needed to use the wall or chair for assistance when walking.  She reports that the only recent change has been a switch in the delivery mechanism of his DDAVP  which was changed from a nasal spray to a pill form.  She reports he has had some congestion and runny nose but otherwise no sick symptoms.  He is talking and interacting at his baseline.  The patient does not complain of any pain or discomfort, but mom reports that that is usual for him and he rarely complains even when he is sick.  The history is provided by a parent.       Prior to Admission medications   Medication Sig Start Date End Date Taking? Authorizing Provider  Apoaequorin (PREVAGEN) 10 MG CAPS 1 capsule Orally once a day    [provider]  ascorbic acid (VITAMIN C) 1000 MG tablet 2 tablets Orally Once a day    [provider]  aspirin  81 MG chewable tablet CHEW AND SWALLOW 1 TABLET(81 MG) BY MOUTH DAILY 10/15/23   Karna Fellows, MD  Cyanocobalamin (B-12 PO) Take by mouth.    [provider]  cyclobenzaprine  (FLEXERIL ) 5 MG tablet Take 1 tablet (5 mg total) by mouth at bedtime as needed for muscle spasms. 08/07/24   Karna Fellows, MD  desmopressin  (DDAVP  NASAL) 0.01 % solution USE ONE SPRAY IN EACH NOSTRIL FOUR TIMES DAILY 12/27/21   Narendra, Nischal, MD  fluticasone  (FLONASE ) 50 MCG/ACT nasal spray Place 1 spray into both nostrils daily. 09/09/24   Karna Fellows, MD  hydrocortisone  (CORTEF ) 5 MG tablet 1 tablet in early morning  Orally Once a day but double the dose on sick days    [provider]  levothyroxine  (SYNTHROID ) 100 MCG tablet Take 100 mcg by mouth daily before breakfast.    [provider]  LORazepam  (ATIVAN ) 2 MG tablet TAKE 3 TABLETS(6 MG) BY MOUTH AT BEDTIME 09/09/24   Karna Fellows, MD  losartan  (COZAAR ) 25 MG tablet TAKE 1 TABLET(25 MG) BY MOUTH DAILY 01/14/24   Karna Fellows, MD  melatonin 3 MG TABS tablet Take 1 tablet (3 mg total) by mouth at bedtime. 01/08/24   Karna Fellows, MD  Multiple Vitamin (MULTI VITAMIN) TABS 1 tablet Orally Once a day    [provider]  potassium chloride  (KLOR-CON ) 10 MEQ tablet Take 2 tablets (20 mEq total) by mouth daily. 09/04/23   Karna Fellows, MD  rosuvastatin  (CRESTOR ) 20 MG tablet TAKE 1 TABLET(20 MG) BY MOUTH DAILY 12/06/23   Karna Fellows, MD  Testosterone  (ANDROGEL  PUMP) 20.25 MG/ACT (1.62%) GEL Place 20.25 mg onto the skin daily. Apply 6 pumps 3 pumps per side) topically to shoulders or upper back once a day 05/10/17   Teofilo Satterfield, MD    Allergies: Patient has no known allergies.    Review of Systems  Updated Vital Signs BP 90/71   Pulse 84   Temp 98 F (36.7 C) (Oral)   Resp 16   Wt 89.8  kg   SpO2 100%   BMI 30.11 kg/m   Physical Exam Constitutional:      Appearance: Normal appearance.  HENT:     Head: Normocephalic and atraumatic.     Nose: Nose normal.     Mouth/Throat:     Mouth: Mucous membranes are moist.     Pharynx: Oropharynx is clear.  Eyes:     Extraocular Movements: Extraocular movements intact.     Conjunctiva/sclera: Conjunctivae normal.     Pupils: Pupils are equal, round, and reactive to light.  Cardiovascular:     Rate and Rhythm: Normal rate and regular rhythm.     Pulses: Normal pulses.     Heart sounds: Normal heart sounds.  Pulmonary:     Effort: Pulmonary effort is normal.     Breath sounds: Normal breath sounds.  Abdominal:     General: Abdomen is flat. Bowel sounds are normal.     Palpations:  Abdomen is soft.  Musculoskeletal:        General: No swelling, tenderness, deformity or signs of injury. Normal range of motion.     Cervical back: Normal range of motion and neck supple.     Right lower leg: No edema.     Left lower leg: No edema.  Skin:    General: Skin is warm and dry.     Capillary Refill: Capillary refill takes less than 2 seconds.  Neurological:     Mental Status: He is alert. Mental status is at baseline.     Cranial Nerves: No cranial nerve deficit.     Sensory: No sensory deficit.     Motor: Weakness present.     (all labs ordered are listed, but only abnormal results are displayed) Labs Reviewed  COMPREHENSIVE METABOLIC PANEL WITH GFR  CBC WITH DIFFERENTIAL/PLATELET  URINALYSIS, ROUTINE W REFLEX MICROSCOPIC  CBG MONITORING, ED    EKG: None  Radiology: No results found.   Procedures   Medications Ordered in the ED - No data to display                                  Medical Decision Making 43 year old patient with a complex medical history, and a recent change in his DDAVP .  Given acute change in strength rate his concern is for hypo or hypernatremia or possible intracranial pathology.  Will obtain CMP, CBC, UA as well as CT head without contrast and reassess.  Will also need to assess patient's gait when able.  CT head shows bilateral calcifications in the cerebellum as well as old lesions in the basal ganglia.  Gait assessment completed, patient exhibits narrow gait with toe walking and intermittent speeding up.  He also appears to become unbalanced and started to stumble forward while walking without any mechanical cause.  Discussed case with neurologist on-call, does not feel that there is anything we can offer without MRI.  Patient unable to tolerate MRI without sedation.  After shared decision making with patient's parents and caregivers, will admit patient for observation overnight and plan for MRI in the morning.  Hospitalist contacted  for admission.  Amount and/or Complexity of Data Reviewed Labs: ordered. Radiology: ordered.  Risk Prescription drug management. Decision regarding hospitalization.    Final diagnoses:  None    ED Discharge Orders     None          Cleotilde Lukes, DO 10/03/24 2107    Plunkett,  Benton, MD 10/03/24 2308

## 2024-10-03 NOTE — H&P (Signed)
 History and Physical    Patient: Nicholas Holt FMW:[id number] DOB: May 28, 1981 DOA: 10/03/2024 DOS: the patient was seen and examined on 10/03/2024 PCP: Karna Fellows, MD  Patient coming from: Home  Chief Complaint:  Chief Complaint  Patient presents with   Gait Problem   HPI: Nicholas Holt is a 43 y.o. male with medical history significant for brain cancer when he was a teenager and now has subsequent severe neurologic disability. He was brought in by his parents.  His mom reports that last night he was holding on to the walls to keep himself steady. This morning it was much worse and she thought he was going to fall even holding on to the wall.  He was very wobbly. Normally he can walk independently. He was also just repeating what she was saying over and over, not communicating clearly.  This was an abrupt change from 24 hours earlier so she brought him in for evaluation.   About a week ago he had a change in his medication.  His DDAVP  was changed from spray to pills.  He had been on DDAVP  spray for more than 15 years. The mom first called his endocrinologist and they were both worried about his sodium level so the endocrinologist advised bringing him to the ED.    Review of Systems: unable to review all systems due to the inability of the patient to answer questions. Past Medical History:  Diagnosis Date   Anxiety    Attention deficit disorder 08/08/2006   Bilateral leg cramps 11/25/2006   Blood transfusion without reported diagnosis 1997   Chemotherapy associated   Brain cancer (HCC) 1998   Cough 12/08/2015   COVID-19 10/16/2017   Elevated hemidiaphragm 07/09/2015   Left, presumably after portacath placement.    Fall 04/13/2022   H/O malignant neoplasm of brain 09/22/2014   Suprasellar germinoma treated at [facility] in 1997; received chemotherapy (cisplatin, etoposide, vincristine, and cyclophosphomide) and radiation therapy.  Complicated by left sided motor tic disorder and  panhypopituitarism.    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  Includes central diabetes insipidus, secondary hypothyroidism, secondary adrenal insufficiency, and secondary hypogonadism.    Perennial allergic rhinitis 07/09/2015   Pre-diabetes    no meds   Preventative health care 07/09/2015   Seizure disorder (HCC) 08/08/2006   Predated brain tumor.  Generalized seizure ocurred after running out of lorazepam  post-tumor.    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 Treated with alendronate  from 2009-2016. DEXA (03/16/2008): L Spine T -2.0. L fem neck T -0.9, R fem neck T -1.9.  DEXA (10/15/2014): L Spine T -1.3, L fem neck T -0.6, R fem neck T -1.8.  Bisphosphonate holiday 06/2015.  Repeat DEXA scan in 10/2016 to reassess.    Weakness 09/15/2023   Past Surgical History:  Procedure Laterality Date   BRAIN BIOPSY  11/14/1995   CSF SHUNT  11/14/1995   PORTACATH PLACEMENT     portacath removal     RADIOLOGY WITH ANESTHESIA N/A 08/30/2021   Procedure: MRI WITH ANESTHESIA BRAIN WITH AND WITHOUT CONTRAST;  Surgeon: Radiologist, Medication, MD;  Location: [facility] OR;  Service: Radiology;  Laterality: N/A;   RADIOLOGY  WITH ANESTHESIA N/A 05/25/2022   Procedure: MRI WITH ANESTHESIA OF BRAIN WITH AND WITHOUT CONTRAST;  Surgeon: Radiologist, Medication, MD;  Location: [facility] OR;  Service: Radiology;  Laterality: N/A;   Social History:  reports that he has never smoked. He has never been exposed to tobacco smoke. He has never used smokeless tobacco. He reports that he does not drink alcohol and does not use drugs.  No Known Allergies  Family History   Problem Relation Age of Onset   Hypertension Mother    Hypertension Father    Hyperlipidemia Father    Prostate cancer Father    Diabetes type I Sister        Died of complications of diabetes in her 21's   Sleep apnea Maternal Aunt     Prior to Admission medications   Medication Sig Start Date End Date Taking? Authorizing Provider  Apoaequorin (PREVAGEN) 10 MG CAPS 1 capsule Orally once a day    [provider]  ascorbic acid (VITAMIN C) 1000 MG tablet 2 tablets Orally Once a day    [provider]  aspirin  81 MG chewable tablet CHEW AND SWALLOW 1 TABLET(81 MG) BY MOUTH DAILY 10/15/23   Karna Fellows, MD  Cyanocobalamin (B-12 PO) Take by mouth.    [provider]  cyclobenzaprine  (FLEXERIL ) 5 MG tablet Take 1 tablet (5 mg total) by mouth at bedtime as needed for muscle spasms. 08/07/24   Karna Fellows, MD  desmopressin  (DDAVP  NASAL) 0.01 % solution USE ONE SPRAY IN EACH NOSTRIL FOUR TIMES DAILY 12/27/21   Narendra, Nischal, MD  fluticasone  (FLONASE ) 50 MCG/ACT nasal spray Place 1 spray into both nostrils daily. 09/09/24   Karna Fellows, MD  hydrocortisone  (CORTEF ) 5 MG tablet 1 tablet in early morning Orally Once a day but double the dose on sick days    [provider]  levothyroxine  (SYNTHROID ) 100 MCG tablet Take 100 mcg by mouth daily before breakfast.    [provider]  LORazepam  (ATIVAN ) 2 MG tablet TAKE 3 TABLETS(6 MG) BY MOUTH AT BEDTIME 09/09/24   Karna Fellows, MD  losartan  (COZAAR ) 25 MG tablet TAKE 1 TABLET(25 MG) BY MOUTH DAILY 01/14/24   Karna Fellows, MD  melatonin 3 MG TABS tablet Take 1 tablet (3 mg total) by mouth at bedtime. 01/08/24   Karna Fellows, MD  Multiple Vitamin (MULTI VITAMIN) TABS 1 tablet Orally Once a day    [provider]  potassium chloride  (KLOR-CON ) 10 MEQ tablet Take 2 tablets (20 mEq total) by mouth daily. 09/04/23   Karna Fellows, MD  rosuvastatin  (CRESTOR ) 20 MG tablet TAKE 1 TABLET(20 MG) BY MOUTH DAILY 12/06/23   Karna Fellows, MD  Testosterone  (ANDROGEL  PUMP) 20.25 MG/ACT (1.62%) GEL Place 20.25 mg onto the skin daily. Apply 6 pumps 3 pumps per side) topically to shoulders or upper back once a day 05/10/17   Teofilo Satterfield, MD    Physical Exam: Vitals:   10/03/24 1630 10/03/24 1636 10/03/24 1858 10/03/24 1900  BP: 90/71  (!) 132/104 (!) 132/104  Pulse: 84  64   Resp: 16  20 20   Temp: 98 F (36.7 C)  97.8 F (36.6 C)   TempSrc: Oral     SpO2: 100%  97%   Weight:  89.8 kg     Physical Exam:  General: No acute distress, developed mental disability apparent HEENT: Normocephalic, atraumatic, PERRL Cardiovascular: Normal rate and rhythm. Distal pulses intact. Pulmonary: Normal pulmonary effort, normal breath sounds Gastrointestinal: Nondistended abdomen, soft,  non-tender, normoactive bowel sounds Musculoskeletal:Normal ROM, no lower ext edema Lymphadenopathy: No cervical LAD. Skin: Skin is warm and dry. Neuro: The patient does follow simple commands. He seems to have a slight right gaze preference though he can look to the left briefly.  He has some difficulty following commands to follow my finger to test extraocular muscles. He does move each of his extremities to command and has good proximal strength.  His left hand seems slightly contractured.  He has difficulty controlling both hands and both feet. He does repeat the same phrases over. PSYCH: Attentive and cooperative to the best of jis ability.  Data Reviewed:  Results for orders placed or performed during the hospital encounter of 10/03/24 (from the past 24 hours)  CBG monitoring, ED     Status: None   Collection Time: 10/03/24  4:30 PM  Result Value Ref Range   Glucose-Capillary 95 70 - 99 mg/dL  Comprehensive metabolic panel     Status: None   Collection Time: 10/03/24  5:24 PM  Result Value Ref Range   Sodium 136 135 - 145 mmol/L   Potassium 3.7 3.5 - 5.1 mmol/L   Chloride 100 98 - 111 mmol/L   CO2 29 22 - 32 mmol/L   Glucose, Bld  87 70 - 99 mg/dL   BUN 12 6 - 20 mg/dL   Creatinine, Ser 8.77 0.61 - 1.24 mg/dL   Calcium  9.4 8.9 - 10.3 mg/dL   Total Protein 7.6 6.5 - 8.1 g/dL   Albumin 4.6 3.5 - 5.0 g/dL   AST 36 15 - 41 U/L   ALT 36 0 - 44 U/L   Alkaline Phosphatase 65 38 - 126 U/L   Total Bilirubin 0.4 0.0 - 1.2 mg/dL   GFR, Estimated >39 >39 mL/min   Anion gap 8 5 - 15  CBC with Differential     Status: None   Collection Time: 10/03/24  5:24 PM  Result Value Ref Range   WBC 5.8 4.0 - 10.5 K/uL   RBC 4.39 4.22 - 5.81 MIL/uL   Hemoglobin 13.1 13.0 - 17.0 g/dL   HCT 59.5 60.9 - 47.9 %   MCV 92.0 80.0 - 100.0 fL   MCH 29.8 26.0 - 34.0 pg   MCHC 32.4 30.0 - 36.0 g/dL   RDW 85.2 88.4 - 84.4 %   Platelets 173 150 - 400 K/uL   nRBC 0.0 0.0 - 0.2 %   Neutrophils Relative % 58 %   Neutro Abs 3.3 1.7 - 7.7 K/uL   Lymphocytes Relative 34 %   Lymphs Abs 2.0 0.7 - 4.0 K/uL   Monocytes Relative 7 %   Monocytes Absolute 0.4 0.1 - 1.0 K/uL   Eosinophils Relative 1 %   Eosinophils Absolute 0.1 0.0 - 0.5 K/uL   Basophils Relative 0 %   Basophils Absolute 0.0 0.0 - 0.1 K/uL   Immature Granulocytes 0 %   Abs Immature Granulocytes 0.02 0.00 - 0.07 K/uL  Urinalysis, Routine w reflex microscopic -Urine, Clean Catch     Status: Abnormal   Collection Time: 10/03/24  5:50 PM  Result Value Ref Range   Color, Urine YELLOW YELLOW   APPearance HAZY (A) CLEAR   Specific Gravity, Urine 1.017 1.005 - 1.030   pH 8.0 5.0 - 8.0   Glucose, UA NEGATIVE NEGATIVE mg/dL   Hgb urine dipstick NEGATIVE NEGATIVE   Bilirubin Urine NEGATIVE NEGATIVE   Ketones, ur NEGATIVE NEGATIVE mg/dL   Protein, ur NEGATIVE NEGATIVE mg/dL  Nitrite NEGATIVE NEGATIVE   Leukocytes,Ua NEGATIVE NEGATIVE  Salicylate level     Status: Abnormal   Collection Time: 10/03/24  8:28 PM  Result Value Ref Range   Salicylate Lvl <7.0 (L) 7.0 - 30.0 mg/dL  Acetaminophen  level     Status: Abnormal   Collection Time: 10/03/24  8:28 PM  Result Value Ref Range    Acetaminophen  (Tylenol ), Serum <10 (L) 10 - 30 ug/mL     Assessment and Plan: Acute gait instability and communication decline in the last 24 hours - - MRI needed. Consult with anesthesia to decide the best way for an MRI given his requirement for complete sedation.  His obstructive sleep apnea complicates the sedation picture.  In 2023 he had full general anesthesia for his MRI.  The patient's mom says she is fine with sedating him however necessary, whether that is just with oral medications or full anesthesia. His urinalysis is within normal limits.  He has no fever.  No other symptom of infection that could contribute to his gait abnormality.  Given his panhypopituitarism, his sodium level and TSH will be scrutinized as possible causes.  2.  Panhypopituitarism -  - Because of the recent change from the DDAVP  spray to pills continue to follow his sodium.   His sodium level is within normal limits but it has dropped 8 points.   - Resume spray - Continue to monitor - He follows with endocrinology - Check TSH in a.m.  3.  Mild obstructive sleep apnea - The patient was not able to tolerate CPAP mask even with his 6 mg of Ativan  at nightly  4.  Cognitive disability - Monitor for agitation during his hospital stay. Since he takes Ativan  nightly Ativan  will probably be the best way medical management.    Advance Care Planning:   Code Status: Full Code the patient's parents are at bedside but they are his caregivers and medical decision makers.  His mother says he is full code.  Consults: Neurology was called by the emergency department but they recommending calling back once MRI results are in.  Family Communication: Patient's mother and father at bedside  Severity of Illness: The appropriate patient status for this patient is INPATIENT. Inpatient status is judged to be reasonable and necessary in order to provide the required intensity of service to ensure the patient's safety. The  patient's presenting symptoms, physical exam findings, and initial radiographic and laboratory data in the context of their chronic comorbidities is felt to place them at high risk for further clinical deterioration. Furthermore, it is not anticipated that the patient will be medically stable for discharge from the hospital within 2 midnights of admission.   * I certify that at the point of admission it is my clinical judgment that the patient will require inpatient hospital care spanning beyond 2 midnights from the point of admission due to high intensity of service, high risk for further deterioration and high frequency of surveillance required.*  Author: ARTHEA CHILD, MD 10/03/2024 9:28 PM  For on call review [email or web address].

## 2024-10-04 DIAGNOSIS — R269 Unspecified abnormalities of gait and mobility: Secondary | ICD-10-CM | POA: Diagnosis not present

## 2024-10-04 LAB — CBC
HCT: 39.3 % (ref 39.0–52.0)
Hemoglobin: 12.9 g/dL — ABNORMAL LOW (ref 13.0–17.0)
MCH: 30.1 pg (ref 26.0–34.0)
MCHC: 32.8 g/dL (ref 30.0–36.0)
MCV: 91.8 fL (ref 80.0–100.0)
Platelets: 147 K/uL — ABNORMAL LOW (ref 150–400)
RBC: 4.28 MIL/uL (ref 4.22–5.81)
RDW: 14.9 % (ref 11.5–15.5)
WBC: 5.3 K/uL (ref 4.0–10.5)
nRBC: 0 % (ref 0.0–0.2)

## 2024-10-04 LAB — BASIC METABOLIC PANEL WITH GFR
Anion gap: 11 (ref 5–15)
BUN: 10 mg/dL (ref 6–20)
CO2: 20 mmol/L — ABNORMAL LOW (ref 22–32)
Calcium: 8.6 mg/dL — ABNORMAL LOW (ref 8.9–10.3)
Chloride: 102 mmol/L (ref 98–111)
Creatinine, Ser: 0.9 mg/dL (ref 0.61–1.24)
GFR, Estimated: >60 mL/min (ref >60)
Glucose, Bld: 90 mg/dL (ref 70–99)
Potassium: 3.9 mmol/L (ref 3.5–5.1)
Sodium: 133 mmol/L — ABNORMAL LOW (ref 135–145)

## 2024-10-04 LAB — SODIUM, URINE, RANDOM: Sodium, Ur: 204 mmol/L

## 2024-10-04 LAB — OSMOLALITY, URINE: Osmolality, Ur: 591 mosm/kg (ref 300–900)

## 2024-10-04 LAB — TSH: TSH: 0.45 u[IU]/mL (ref 0.350–4.500)

## 2024-10-04 LAB — HIV ANTIBODY (ROUTINE TESTING W REFLEX): HIV Screen 4th Generation wRfx: NONREACTIVE

## 2024-10-04 MED ORDER — POTASSIUM CHLORIDE CRYS ER 10 MEQ PO TBCR
10.0000 meq | EXTENDED_RELEASE_TABLET | Freq: Two times a day (BID) | ORAL | Status: DC
  Administered 2024-10-04 – 2024-10-10 (×12): 10 meq via ORAL
  Filled 2024-10-04 (×15): qty 1

## 2024-10-04 MED ORDER — LORAZEPAM 1 MG PO TABS: 2.0000 mg | ORAL_TABLET | Freq: Once | ORAL | Status: DC

## 2024-10-04 MED ORDER — LOSARTAN POTASSIUM 50 MG PO TABS
25.0000 mg | ORAL_TABLET | Freq: Every day | ORAL | Status: DC
  Administered 2024-10-04 – 2024-10-10 (×7): 25 mg via ORAL
  Filled 2024-10-04 (×8): qty 1

## 2024-10-04 MED ORDER — CYCLOBENZAPRINE HCL 5 MG PO TABS
5.0000 mg | ORAL_TABLET | Freq: Every evening | ORAL | Status: DC | PRN
  Administered 2024-10-05: 5 mg via ORAL
  Filled 2024-10-04: qty 1

## 2024-10-04 MED ORDER — DESMOPRESSIN ACETATE 0.1 MG PO TABS: 0.2000 mg | ORAL_TABLET | Freq: Three times a day (TID) | ORAL | Status: DC

## 2024-10-04 MED ORDER — HYDROCORTISONE 5 MG PO TABS
5.0000 mg | ORAL_TABLET | Freq: Every day | ORAL | Status: DC
  Administered 2024-10-04 – 2024-10-10 (×6): 5 mg via ORAL
  Filled 2024-10-04 (×9): qty 1

## 2024-10-04 MED ORDER — LEVOTHYROXINE SODIUM 100 MCG PO TABS
100.0000 ug | ORAL_TABLET | Freq: Every day | ORAL | Status: DC
  Administered 2024-10-04 – 2024-10-10 (×7): 100 ug via ORAL
  Filled 2024-10-04 (×8): qty 1

## 2024-10-04 MED ORDER — LORAZEPAM 1 MG PO TABS
6.0000 mg | ORAL_TABLET | Freq: Every day | ORAL | Status: DC
  Administered 2024-10-04 – 2024-10-09 (×6): 6 mg via ORAL
  Filled 2024-10-04 (×7): qty 6

## 2024-10-04 MED ORDER — ASPIRIN 81 MG PO CHEW
81.0000 mg | CHEWABLE_TABLET | Freq: Every day | ORAL | Status: DC
  Administered 2024-10-05 – 2024-10-10 (×6): 81 mg via ORAL
  Filled 2024-10-04 (×8): qty 1

## 2024-10-04 MED ORDER — DESMOPRESSIN ACETATE 0.1 MG PO TABS
0.2000 mg | ORAL_TABLET | Freq: Three times a day (TID) | ORAL | Status: DC
  Administered 2024-10-04 – 2024-10-07 (×8): 0.2 mg via ORAL
  Filled 2024-10-04 (×10): qty 2

## 2024-10-04 MED ORDER — DESMOPRESSIN ACETATE SPRAY 0.01 % NA SOLN
10.0000 ug | Freq: Four times a day (QID) | NASAL | Status: DC
  Filled 2024-10-04: qty 5

## 2024-10-04 NOTE — Plan of Care (Signed)
 Brief Neuro phone discussion with Dr. Cleotilde with the ED team:  I was called earlier about Nicholas Holt who has a hx of brain cancer, panhypoptiuitarism and p/w stumbling and difficulty with balance for the last 24+hours. Speech is garbled. Initially sent for concern for hyponatremia due to recent switch in mode of administration of DDAVP  from oral to nasal but labs here not concerning for hyponatremia. He was noted to have L leg weakness but review of recent notes from his outpatient neurologist Dr. Margaret shows that this is baseline for him. He is noted to walk on his toes and this too is baseline per outpatient neuro notes. He is parroting what people are saying to him per discussion with ED. He is mumbling right now.  Agree with getting MRI Brain with and without contrast but I do not have any other recommendations at this time, the symptoms noted today seem chronic on reivew of prior neuro notes. Exam seems similar to outpatient neuro exam so I dont think we ill have much to add at this time. Please let us  know if the MRI shows any abnormaltiies, otherwise, can follow outpatient.  Nicholas Holt [facility]

## 2024-10-04 NOTE — Progress Notes (Signed)
 Consultation Progress Note   Patient: Nicholas Holt FMW:[id number] DOB: January 18, 1981 DOA: 10/03/2024 DOS: the patient was seen and examined on 10/04/2024 Primary service: Becket Wecker, Landon BRAVO, MD  Brief hospital course: Nicholas Holt is a 43 y.o. male with medical history significant for brain cancer when he was a teenager and now has subsequent severe neurologic disability. He was brought in by his parents.  His mom reports that last night he was holding on to the walls to keep himself steady. This morning it was much worse and she thought he was going to fall even holding on to the wall.  He was very wobbly. Normally he can walk independently. He was also just repeating what she was saying over and over, not communicating clearly.  This was an abrupt change from 24 hours earlier so she brought him in for evaluation.   About a week ago he had a change in his medication.  His DDAVP  was changed from spray to pills.  He had been on DDAVP  spray for more than 15 years. The mom first called his endocrinologist and they were both worried about his sodium level so the endocrinologist advised bringing him to the ED.   Assessment and Plan: Acute gait instability and communication decline - MRI pending. - Consult with anesthesia to decide the best way for an MRI given his requirement for complete sedation.  - L leg weakness was noted at time of admit, per OP Neurology notes that appears to be his baseline. -There is also concern for mumbling per Mother at bedside.  -Await MRI for better evaluation, Neurology following. -.  In 2023 he had full general anesthesia for his MRI.  The patient's mom says she is fine with sedating him however necessary, whether that is just with oral medications or full anesthesia. TSH WNL   2.  Panhypopituitarism -  - Because of the recent change from the DDAVP  spray to pills continue to follow his sodium.  Sodium trend--144-136--133 - Resume tablets - Continue to monitor -  He follows with endocrinology   3.  Mild obstructive sleep apnea - The patient was not able to tolerate CPAP mask even with his 6 mg of Ativan  at nightly   4.  Cognitive disability - Monitor for agitation during his hospital stay. Since he takes Ativan  nightly Ativan  will probably be the best way medical management.      [facility] will continue to follow the patient.  Subjective: Patient seen at bedside this morning he was sleeping, family at bedside.  They were concerned about timing of the MRI.  Physical Exam: Vitals:   10/04/24 0445 10/04/24 0513 10/04/24 0651 10/04/24 0809  BP:  (!) 124/99  110/74  Pulse:  67  66  Resp:      Temp: 98 F (36.7 C) (!) 97.5 F (36.4 C)  (!) 97.4 F (36.3 C)  TempSrc: Oral     SpO2:  100%  100%  Weight:   89.9 kg   Height:   5' 8 (1.727 m)   General: Sleeping during my encounter HEENT: Normocephalic, atraumatic, PERRL Cardiovascular: Normal rate and rhythm. Distal pulses intact. Pulmonary: Normal pulmonary effort, normal breath sounds Gastrointestinal: Nondistended abdomen, soft, non-tender, normoactive bowel sounds Musculoskeletal:Normal ROM, no lower ext edema Lymphadenopathy: No cervical LAD. Skin: Skin is warm and dry. Neuro: Unable to assess as patient was sleeping. PSYCH: Attentive and cooperative to the best of jis ability.  Data Reviewed:    CBC    Component Value  Date/Time   WBC 5.3 10/04/2024 0901   RBC 4.28 10/04/2024 0901   HGB 12.9 (L) 10/04/2024 0901   HGB 13.4 07/08/2024 0858   HCT 39.3 10/04/2024 0901   HCT 41.8 07/08/2024 0858   PLT 147 (L) 10/04/2024 0901   PLT 133 (L) 07/08/2024 0858   MCV 91.8 10/04/2024 0901   MCV 95 07/08/2024 0858   MCH 30.1 10/04/2024 0901   MCHC 32.8 10/04/2024 0901   RDW 14.9 10/04/2024 0901   RDW 13.1 07/08/2024 0858   LYMPHSABS 2.0 10/03/2024 1724   LYMPHSABS 3.0 08/19/2019 0934   MONOABS 0.4 10/03/2024 1724   EOSABS 0.1 10/03/2024 1724   EOSABS 0.1 08/19/2019 0934   BASOSABS  0.0 10/03/2024 1724   BASOSABS 0.0 08/19/2019 0934   CMP     Component Value Date/Time   NA 133 (L) 10/04/2024 0901   NA 144 07/08/2024 0858   K 3.9 10/04/2024 0901   CL 102 10/04/2024 0901   CO2 20 (L) 10/04/2024 0901   GLUCOSE 90 10/04/2024 0901   BUN 10 10/04/2024 0901   BUN 16 07/08/2024 0858   CREATININE 0.90 10/04/2024 0901   CREATININE 0.86 02/09/2015 1032   CALCIUM  8.6 (L) 10/04/2024 0901   CALCIUM  9.2 03/27/2008 1350   PROT 7.6 10/03/2024 1724   PROT 7.4 02/06/2018 1558   ALBUMIN 4.6 10/03/2024 1724   ALBUMIN 4.4 02/06/2018 1558   AST 36 10/03/2024 1724   ALT 36 10/03/2024 1724   ALKPHOS 65 10/03/2024 1724   BILITOT 0.4 10/03/2024 1724   BILITOT <0.2 02/06/2018 1558   EGFR 83 07/08/2024 0858   GFRNONAA >60 10/04/2024 0901   GFRNONAA >89 02/09/2015 1032    Family Communication: Plan discussed with mother and father at bedside  Time spent: 35 minutes.  Author: Landon FORBES Baller, MD 10/04/2024 8:48 AM  For on call review [email or web address].

## 2024-10-04 NOTE — Plan of Care (Signed)

## 2024-10-04 NOTE — ED Notes (Signed)
[facility] transport setup for pt

## 2024-10-05 ENCOUNTER — Other Ambulatory Visit: Payer: Self-pay

## 2024-10-05 ENCOUNTER — Encounter (HOSPITAL_COMMUNITY)
Admission: EM | Disposition: A | Discharge status: Rehabilitation Facility | Payer: Self-pay | Source: Home / Self Care | Attending: Internal Medicine

## 2024-10-05 ENCOUNTER — Encounter (HOSPITAL_COMMUNITY): Admitting: Anesthesiology

## 2024-10-05 ENCOUNTER — Inpatient Hospital Stay (HOSPITAL_COMMUNITY)

## 2024-10-05 ENCOUNTER — Inpatient Hospital Stay (HOSPITAL_COMMUNITY): Admitting: Anesthesiology

## 2024-10-05 ENCOUNTER — Encounter (HOSPITAL_COMMUNITY): Payer: Self-pay | Admitting: Internal Medicine

## 2024-10-05 DIAGNOSIS — R269 Unspecified abnormalities of gait and mobility: Secondary | ICD-10-CM | POA: Diagnosis not present

## 2024-10-05 HISTORY — PX: RADIOLOGY WITH ANESTHESIA: SHX6223

## 2024-10-05 LAB — RAPID URINE DRUG SCREEN, HOSP PERFORMED
Amphetamines: NOT DETECTED
Barbiturates: NOT DETECTED
Benzodiazepines: POSITIVE — AB
Cocaine: NOT DETECTED
Opiates: NOT DETECTED
Tetrahydrocannabinol: NOT DETECTED

## 2024-10-05 LAB — GLUCOSE, CAPILLARY: Glucose-Capillary: 91 mg/dL (ref 70–99)

## 2024-10-05 MED ORDER — LACTATED RINGERS IV SOLN: INTRAVENOUS | Status: DC

## 2024-10-05 MED ORDER — CHLORHEXIDINE GLUCONATE 0.12 % MT SOLN
OROMUCOSAL | Status: AC
Start: 2024-10-05 — End: 2024-10-05
  Filled 2024-10-05: qty 15

## 2024-10-05 MED ORDER — SUGAMMADEX SODIUM 200 MG/2ML IV SOLN
INTRAVENOUS | Status: DC | PRN
  Administered 2024-10-05: 200 mg via INTRAVENOUS

## 2024-10-05 MED ORDER — ROCURONIUM BROMIDE 100 MG/10ML IV SOLN
INTRAVENOUS | Status: DC | PRN
  Administered 2024-10-05: 60 mg via INTRAVENOUS

## 2024-10-05 MED ORDER — PROPOFOL 10 MG/ML IV BOLUS
INTRAVENOUS | Status: DC | PRN
  Administered 2024-10-05: 40 mg via INTRAVENOUS
  Administered 2024-10-05: 100 mg via INTRAVENOUS

## 2024-10-05 MED ORDER — ASPIRIN 81 MG PO TBEC: 81.0000 mg | DELAYED_RELEASE_TABLET | Freq: Every day | ORAL | Status: DC

## 2024-10-05 MED ORDER — ORAL CARE MOUTH RINSE: 15.0000 mL | Freq: Once | OROMUCOSAL | Status: AC

## 2024-10-05 MED ORDER — GADOBUTROL 1 MMOL/ML IV SOLN
10.0000 mL | Freq: Once | INTRAVENOUS | Status: AC | PRN
  Administered 2024-10-05: 10 mL via INTRAVENOUS

## 2024-10-05 MED ORDER — ROSUVASTATIN CALCIUM 20 MG PO TABS
20.0000 mg | ORAL_TABLET | Freq: Every day | ORAL | Status: DC
  Administered 2024-10-05 – 2024-10-10 (×6): 20 mg via ORAL
  Filled 2024-10-05 (×6): qty 1

## 2024-10-05 MED ORDER — FENTANYL CITRATE (PF) 100 MCG/2ML IJ SOLN
INTRAMUSCULAR | Status: DC | PRN
Start: 2024-10-05 — End: 2024-10-05
  Administered 2024-10-05: 50 ug via INTRAVENOUS

## 2024-10-05 MED ORDER — CLOPIDOGREL BISULFATE 75 MG PO TABS
75.0000 mg | ORAL_TABLET | Freq: Every day | ORAL | Status: DC
  Administered 2024-10-05 – 2024-10-10 (×6): 75 mg via ORAL
  Filled 2024-10-05 (×6): qty 1

## 2024-10-05 MED ORDER — HEPARIN SODIUM (PORCINE) 5000 UNIT/ML IJ SOLN
5000.0000 [IU] | Freq: Three times a day (TID) | INTRAMUSCULAR | Status: DC
  Administered 2024-10-05 – 2024-10-10 (×14): 5000 [IU] via SUBCUTANEOUS
  Filled 2024-10-05 (×15): qty 1

## 2024-10-05 MED ORDER — CHLORHEXIDINE GLUCONATE 0.12 % MT SOLN
15.0000 mL | Freq: Once | OROMUCOSAL | Status: AC
Start: 2024-10-05 — End: 2024-10-05
  Administered 2024-10-05: 15 mL via OROMUCOSAL

## 2024-10-05 NOTE — Transfer of Care (Signed)
 Immediate Anesthesia Transfer of Care Note  Patient: Nicholas Holt  Procedure(s) Performed: MRI WITH ANESTHESIA  Patient Location: PACU  Anesthesia Type:General  Level of Consciousness: awake, alert , and drowsy  Airway & Oxygen Therapy: Patient Spontanous Breathing  Post-op Assessment: Report given to RN and Post -op Vital signs reviewed and stable  Post vital signs: Reviewed and stable  Last Vitals:  Vitals Value Taken Time  BP 123/72 10/05/24 10:15  Temp    Pulse 63 10/05/24 10:28  Resp 13 10/05/24 10:28  SpO2 100 % 10/05/24 10:28  Vitals shown include unfiled device data.  Last Pain:  Vitals:   10/05/24 0814  TempSrc:   PainSc: 0-No pain         Complications: No notable events documented.

## 2024-10-05 NOTE — Consult Note (Addendum)
 Stroke Neurology Consultation Note  Consult Requested by: Dr. Carlota  Reason for Consult: stroke  Consult Date: 10/06/24   The history was obtained from the parents.  During history and examination, all items were  able to obtain unless otherwise noted.  History of Present Illness:  Nicholas Holt is a 43 y.o. African American male with PMH of suprassellar germinoma s/p chemo and radiation in 1997 with residue neuro disability and panhypopituitarism admitted for acute onset imbalance, difficulty walking  and slurry speech. Per mom, pt had difficulty walking about 4 days ago, had to hold onto thing on walking, which was not himself. Also he had slurry speech more than his baseline. Pt was sent to ER for evaluation. Initially this was thought to be hyponatremia since his DDAVP  changed from oral to nasal route but Na here is not too much a concern. CT showed no acute change but bilateral calcification consistent with Fahr disease. MRI showed small left BG infarct. Neurology was consulted.   Pt follows with Dr. Rosette at [facility], has baseline L sided mild weakness with mental slowing. Per family he has some baseline dysarthria but not as bad as this time. Pt takes ASA and crestor  at home.    Past Medical History:  Diagnosis Date   Anxiety    Attention deficit disorder 08/08/2006   Bilateral leg cramps 11/25/2006   Blood transfusion without reported diagnosis 1997   Chemotherapy associated   Brain cancer (HCC) 1998   Cough 12/08/2015   COVID-19 10/16/2017   Elevated hemidiaphragm 07/09/2015   Left, presumably after portacath placement.    Fall 04/13/2022   H/O malignant neoplasm of brain 09/22/2014   Suprasellar germinoma treated at [facility] in 1997; received chemotherapy (cisplatin, etoposide, vincristine, and cyclophosphomide) and radiation therapy.  Complicated by left sided motor tic disorder and panhypopituitarism.    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  Includes central diabetes insipidus, secondary hypothyroidism, secondary adrenal insufficiency, and secondary hypogonadism.    Perennial allergic rhinitis 07/09/2015   Pre-diabetes    no meds   Preventative health care 07/09/2015   Seizure disorder (HCC) 08/08/2006   Predated brain tumor.  Generalized seizure ocurred after running out of lorazepam  post-tumor.    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 Treated with alendronate  from 2009-2016. DEXA (03/16/2008): L Spine T -2.0. L fem neck T -0.9, R fem neck T -1.9.  DEXA (10/15/2014): L Spine T -1.3, L fem neck T -0.6, R fem neck T -1.8.  Bisphosphonate holiday 06/2015.  Repeat DEXA scan in 10/2016 to reassess.    Weakness 09/15/2023    Past Surgical History:  Procedure Laterality Date   BRAIN BIOPSY  11/14/1995   CSF SHUNT  11/14/1995   PORTACATH PLACEMENT     portacath removal     RADIOLOGY WITH ANESTHESIA N/A 08/30/2021   Procedure: MRI WITH ANESTHESIA BRAIN WITH AND WITHOUT CONTRAST;  Surgeon: Radiologist, Medication, MD;  Location: [facility] OR;  Service: Radiology;  Laterality: N/A;   RADIOLOGY WITH ANESTHESIA N/A 05/25/2022   Procedure: MRI WITH ANESTHESIA OF BRAIN WITH AND WITHOUT CONTRAST;  Surgeon: Radiologist, Medication, MD;  Location: [facility] OR;  Service: Radiology;  Laterality:  N/A;    Family History  Problem Relation Age of Onset   Hypertension Mother    Hypertension Father    Hyperlipidemia Father    Prostate cancer Father    Diabetes type I Sister        Died of complications of diabetes in her 24's   Sleep apnea Maternal Aunt     Social History:  reports that he has never smoked. He has never been  exposed to tobacco smoke. He has never used smokeless tobacco. He reports that he does not drink alcohol and does not use drugs.  Allergies: No Known Allergies  No current facility-administered medications on file prior to encounter.   Current Outpatient Medications on File Prior to Encounter  Medication Sig Dispense Refill   Apoaequorin (PREVAGEN) 10 MG CAPS Take 10 mg by mouth daily.     ascorbic acid (VITAMIN C) 1000 MG tablet Take 2,000 mg by mouth daily.     aspirin  81 MG chewable tablet CHEW AND SWALLOW 1 TABLET(81 MG) BY MOUTH DAILY 90 tablet 3   Cyanocobalamin (B-12 PO) Take 1 tablet by mouth daily.     cyclobenzaprine  (FLEXERIL ) 5 MG tablet Take 1 tablet (5 mg total) by mouth at bedtime as needed for muscle spasms. 30 tablet 1   desmopressin  (DDAVP  NASAL) 0.01 % solution USE ONE SPRAY IN EACH NOSTRIL FOUR TIMES DAILY 70 mL 3   desmopressin  (DDAVP ) 0.2 MG tablet Take 0.2 mg by mouth in the morning, at noon, and at bedtime.     fluticasone  (FLONASE ) 50 MCG/ACT nasal spray Place 1 spray into both nostrils daily. 16 g 0   hydrocortisone  (CORTEF ) 5 MG tablet Take 5 mg by mouth daily.     levothyroxine  (SYNTHROID ) 100 MCG tablet Take 100 mcg by mouth daily before breakfast.     LORazepam  (ATIVAN ) 2 MG tablet TAKE 3 TABLETS(6 MG) BY MOUTH AT BEDTIME 90 tablet 0   losartan  (COZAAR ) 25 MG tablet TAKE 1 TABLET(25 MG) BY MOUTH DAILY 90 tablet 3   Multiple Vitamin (MULTI VITAMIN) TABS Take 1 tablet by mouth daily.     potassium chloride  (KLOR-CON ) 10 MEQ tablet Take 2 tablets (20 mEq total) by mouth daily. (Patient taking differently: Take 10 mEq by mouth 2 (two) times daily.) 180 tablet 3   rosuvastatin  (CRESTOR ) 20 MG tablet TAKE 1 TABLET(20 MG) BY MOUTH DAILY 90 tablet 3   Testosterone  (ANDROGEL  PUMP) 20.25 MG/ACT (1.62%) GEL Place 20.25 mg onto the skin daily. Apply 6 pumps 3 pumps per side) topically to shoulders or upper back once a day 75 g 3   melatonin 3 MG TABS tablet Take 1 tablet (3  mg total) by mouth at bedtime. (Patient not taking: Reported on 10/03/2024) 90 tablet 3    Review of Systems: A full ROS was attempted today and was  able to be performed.  Systems assessed include - Constitutional, Eyes, HENT, Respiratory, Cardiovascular, Gastrointestinal, Genitourinary, Integument/breast, Hematologic/lymphatic, Musculoskeletal, Neurological, Behavioral/Psych, Endocrine, Allergic/Immunologic - with pertinent responses as per HPI.  Physical Examination: Temp:  [96.6 F (35.9 C)-98.7 F (37.1 C)] 98.2 F (36.8 C) (11/23 1600) Pulse Rate:  [60-83] 63 (11/23 1600) Resp:  [15-20] 16 (11/23 1600) BP: (108-128)/(64-90) 128/88 (11/23 1600) SpO2:  [93 %-100 %] 100 % (11/23 1600)  General - well nourished, well developed, in no apparent distress.   Cardiovascular - regular rhythm and rate Neuro - awake, alert, eyes open, mildly lethargic, orientated to place, month and people, told me age 58  instead of 43, need cue from mom for the year No aphasia but paucity of speech, following all simple commands, mild dysarthria with psychomotor slowing. Able to name and repeat in dysarthric voice. No gaze palsy, tracking bilaterally, visual field full. Mild R facial droop. Tongue midline. Bilateral UEs 5/5, no drift, however, LUE dystonia with left wrist drop. RLE 4/5 and LLE 3+/5 proximally and 3/5 distally. Sensation symmetrical bilaterally, b/l FTN intact but slow bilaterally, gait not tested.    Data Reviewed: MR BRAIN W WO CONTRAST Result Date: 10/05/2024 CLINICAL DATA:  Altered mental status. Gait instability. Communication difficulty. History of suprasellar germinoma and Fahr disease. EXAM: MRI HEAD WITHOUT AND WITH CONTRAST TECHNIQUE: Multiplanar, multiecho pulse sequences of the brain and surrounding structures were obtained without and with intravenous contrast. CONTRAST:  10mL GADAVIST  GADOBUTROL  1 MMOL/ML IV SOLN COMPARISON:  Head CT 10/03/2024.  MRI 05/25/2022. FINDINGS: Brain:  Diffusion imaging shows an 8 mm acute infarction in the posterior limb of the internal capsule on the left. No other acute stroke. Chronic lesions of the nuclear regions of both cerebellar hemispheres are unchanged. Old small vessel infarctions of the thalami, basal ganglia and deep white matter are unchanged. Previous surgical approach from a right frontal approach with chronic encephalomalacia. Chronic postsurgical changes in the suprasellar region and hypothalamus are stable. Ventricular size is stable. No extra-axial fluid collection. After contrast administration, no acute brain or leptomeningeal enhancement occurs. No sign of recurrent neoplasm. Vascular: Major vessels at the base of the brain show flow. Skull and upper cervical spine: Otherwise negative Sinuses/Orbits: Clear/normal Other: None IMPRESSION: 1. 8 mm acute infarction in the posterior limb of the internal capsule on the left. 2. Chronic postsurgical changes in the suprasellar region and hypothalamus. No evidence of recurrent neoplasm. 3. Chronic small-vessel ischemic changes elsewhere throughout the brain as outlined above. 4. 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 Electronically Signed   By: Oneil Officer M.D.   On: 10/05/2024 09:43   CT Head Wo Contrast Result Date: 10/03/2024 EXAM: CT HEAD WITHOUT CONTRAST 10/03/2024 05:37:52 PM TECHNIQUE: CT of the head was performed without the administration of intravenous contrast. Automated exposure control, iterative reconstruction, and/or weight based adjustment of the mA/kV was utilized to reduce the radiation dose to as low as reasonably achievable. COMPARISON: None available. CLINICAL HISTORY: Neuro deficit, acute, stroke suspected FINDINGS: BRAIN AND VENTRICLES: No acute hemorrhage. No evidence of acute infarct. No hydrocephalus. No extra-axial collection. No mass effect or midline shift. Calcification within left greater than right basal ganglia. Bilateral  cerebellar calcification. 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 Chronic encephalomalacia or lacunar infarcts are present in the inferior left basal ganglia. Encephalomalacia is present in the anterior right frontal lobe along the prior ventriculostomy catheter tract. Hypothalamic postoperative changes are noted. ORBITS: No acute abnormality. SINUSES: No acute abnormality. SOFT TISSUES AND SKULL: No acute soft tissue abnormality. Right frontal burr hole for prior ventriculostomy catheter noted. No skull fracture. IMPRESSION: 1. No acute intracranial abnormality. 2. Findings consistent with Fahr disease. 3. Chronic encephalomalacia or lacunar infarcts in the inferior left basal ganglia. 4. Right frontal burr hole for prior ventriculostomy catheter with associated anterior right frontal lobe encephalomalacia. Electronically signed by: Lonni Necessary MD 10/03/2024 05:45 PM EST RP Workstation: [id number]    Assessment: 43 y.o. male with PMH of suprassellar germinoma s/p chemo and radiation in 1997 with residue neuro disability and panhypopituitarism admitted for acute onset imbalance, difficulty walking  and slurry speech. CT showed no  acute change but bilateral calcification consistent with Fahr disease. CTA head and neck neg. MRI showed small left BG infarct. Pt did not have significant stroke risk factors, current stroke likely related to his brain radiation in 1997 which caused small vessel radiation related vasculopathy. EF 60-65%. LDL 48 and A1C pending. UDS neg. Will do DAPT for 3 weeks and then plavix  alone. Continue home statin. Recommend PT/OT and continue follow up with Dr. Margaret and Dr. Buck at [facility]. Continue steroid, synthroid , and DDAVP  per primary team.    Plan: - Will do DAPT for 3 weeks and then plavix  alone.  - Continue home statin.  - Recommend PT/OT and continue follow up with Dr. Margaret and Dr. Buck at [facility].  - Continue steroid, synthroid , and DDAVP  per primary team.  -  Neurology will sign off. Please call with questions. Pt will follow up with Dr. Buck at [facility] on 10/22/24. Thanks for the consult.   Thank you for this consultation and allowing us  to participate in the care of this patient.  Ary Cummins, MD PhD Stroke Neurology 10/06/2024 5:30 PM

## 2024-10-05 NOTE — Anesthesia Preprocedure Evaluation (Addendum)
 Anesthesia Evaluation  Patient identified by MRN, date of birth, ID bandGeneral Assessment Comment:Mumbled speech, follows simple commands all 4 extremities, left appears weaker than right   Reviewed: Allergy & Precautions, NPO status , Patient's Chart, lab work & pertinent test results  History of Anesthesia Complications Negative for: history of anesthetic complications  Airway Mallampati: Unable to assess  TM Distance: >3 FB   Mouth opening: Limited Mouth Opening  Dental  (+) Dental Advisory Given, Teeth Intact   Pulmonary neg shortness of breath, sleep apnea and Continuous Positive Airway Pressure Ventilation , neg COPD   breath sounds clear to auscultation       Cardiovascular hypertension, (-) angina (-) Past MI and (-) CHF  Rhythm:Regular     Neuro/Psych Seizures -,  PSYCHIATRIC DISORDERS Anxiety     S/p excision, chemo, radiation for suprasellar brain tumor.  Neuromuscular disease    GI/Hepatic negative GI ROS, Neg liver ROS,,,  Endo/Other  Hypothyroidism    Renal/GU negative Renal ROSLab Results      Component                Value               Date                      NA                       133 (L)             10/04/2024                K                        3.9                 10/04/2024                CO2                      20 (L)              10/04/2024                GLUCOSE                  90                  10/04/2024                BUN                      10                  10/04/2024                CREATININE               0.90                10/04/2024                CALCIUM                   8.6 (L)             10/04/2024                EGFR  83                  07/08/2024                GFRNONAA                 >60                 10/04/2024                Musculoskeletal   Abdominal   Peds  Hematology negative hematology ROS (+) Lab Results      Component                 Value               Date                      WBC                      5.3                 10/04/2024                HGB                      12.9 (L)            10/04/2024                HCT                      39.3                10/04/2024                MCV                      91.8                10/04/2024                PLT                      147 (L)             10/04/2024              Anesthesia Other Findings DDAVP , Synthroid , Hydrocortisone  for panhypopituitarism replacement  Reproductive/Obstetrics                              Anesthesia Physical Anesthesia Plan  ASA: 3  Anesthesia Plan: General   Post-op Pain Management:    Induction:   PONV Risk Score and Plan: 2 and Ondansetron  and Dexamethasone   Airway Management Planned: Oral ETT  Additional Equipment: None  Intra-op Plan:   Post-operative Plan: Extubation in OR  Informed Consent: I have reviewed the patients History and Physical, chart, labs and discussed the procedure including the risks, benefits and alternatives for the proposed anesthesia with the patient or authorized representative who has indicated his/her understanding and acceptance.     Dental advisory given  Plan Discussed with: CRNA  Anesthesia Plan Comments:          Anesthesia Quick Evaluation

## 2024-10-05 NOTE — Plan of Care (Signed)

## 2024-10-05 NOTE — Progress Notes (Signed)
 Consultation Progress Note   Patient: Nicholas Holt FMW:[id number] DOB: 08-24-1981 DOA: 10/03/2024 DOS: the patient was seen and examined on 10/05/2024 Primary service: Carrine Kroboth, Landon BRAVO, MD  Brief hospital course: [name] is a 43 y.o. male with medical history significant for brain cancer when he was a teenager and now has subsequent severe neurologic disability. He was brought in by his parents.  His mom reports that last night he was holding on to the walls to keep himself steady. This morning it was much worse and she thought he was going to fall even holding on to the wall.  He was very wobbly. Normally he can walk independently. He was also just repeating what she was saying over and over, not communicating clearly.  This was an abrupt change from 24 hours earlier so she brought him in for evaluation.   About a week ago he had a change in his medication.  His DDAVP  was changed from spray to pills.  He had been on DDAVP  spray for more than 15 years. The mom first called his endocrinologist and they were both worried about his sodium level so the endocrinologist advised bringing him to the ED.   Assessment and Plan: Acute gait instability and communication decline - MRI pending. - Consulted with anesthesia to decide the best way for an MRI given his requirement for complete sedation.  - L leg weakness was noted at time of admit, per OP Neurology notes that appears to be his baseline. -There is also concern for mumbling per Mother at bedside.  -Await MRI for better evaluation, Neurology following. -.  In 2023 he had full general anesthesia for his MRI.  The patient's mom says she is fine with sedating him however necessary, whether that is just with oral medications or full anesthesia. TSH WNL   2.  Panhypopituitarism -  - Because of the recent change from the DDAVP  spray to pills continue to follow his sodium.  Sodium trend--144-136--133 - Resume tablets - Continue to monitor -  He follows with endocrinology   3.  Mild obstructive sleep apnea - The patient was not able to tolerate CPAP mask even with his 6 mg of Ativan  at nightly   4.  Cognitive disability - Monitor for agitation during his hospital stay. Since he takes Ativan  nightly Ativan  will probably be the best way medical management.      TRH will continue to follow the patient.  Subjective: Patient was at MRI. No acute overnight events Physical Exam: Vitals:   10/05/24 1013 10/05/24 1015 10/05/24 1030 10/05/24 1044  BP: 114/75 123/72 108/79 121/82  Pulse: 60 62 66 64  Resp: 17 16 20 17   Temp: (!) 96.6 F (35.9 C)   97.6 F (36.4 C)  TempSrc:      SpO2: 97% 98% 100% 97%  Weight:      Height:      General: Sleeping during my encounter HEENT: Normocephalic, atraumatic, PERRL Cardiovascular: Normal rate and rhythm. Distal pulses intact. Pulmonary: Normal pulmonary effort, normal breath sounds Gastrointestinal: Nondistended abdomen, soft, non-tender, normoactive bowel sounds Musculoskeletal:Normal ROM, no lower ext edema Lymphadenopathy: No cervical LAD. Skin: Skin is warm and dry. Neuro: Unable to assess as patient was sleeping. PSYCH: Attentive and cooperative to the best of jis ability.  Data Reviewed:    CBC    Component Value Date/Time   WBC 5.3 10/04/2024 0901   RBC 4.28 10/04/2024 0901   HGB 12.9 (L) 10/04/2024 0901   HGB  13.4 07/08/2024 0858   HCT 39.3 10/04/2024 0901   HCT 41.8 07/08/2024 0858   PLT 147 (L) 10/04/2024 0901   PLT 133 (L) 07/08/2024 0858   MCV 91.8 10/04/2024 0901   MCV 95 07/08/2024 0858   MCH 30.1 10/04/2024 0901   MCHC 32.8 10/04/2024 0901   RDW 14.9 10/04/2024 0901   RDW 13.1 07/08/2024 0858   LYMPHSABS 2.0 10/03/2024 1724   LYMPHSABS 3.0 08/19/2019 0934   MONOABS 0.4 10/03/2024 1724   EOSABS 0.1 10/03/2024 1724   EOSABS 0.1 08/19/2019 0934   BASOSABS 0.0 10/03/2024 1724   BASOSABS 0.0 08/19/2019 0934   CMP     Component Value Date/Time   NA  133 (L) 10/04/2024 0901   NA 144 07/08/2024 0858   K 3.9 10/04/2024 0901   CL 102 10/04/2024 0901   CO2 20 (L) 10/04/2024 0901   GLUCOSE 90 10/04/2024 0901   BUN 10 10/04/2024 0901   BUN 16 07/08/2024 0858   CREATININE 0.90 10/04/2024 0901   CREATININE 0.86 02/09/2015 1032   CALCIUM  8.6 (L) 10/04/2024 0901   CALCIUM  9.2 03/27/2008 1350   PROT 7.6 10/03/2024 1724   PROT 7.4 02/06/2018 1558   ALBUMIN 4.6 10/03/2024 1724   ALBUMIN 4.4 02/06/2018 1558   AST 36 10/03/2024 1724   ALT 36 10/03/2024 1724   ALKPHOS 65 10/03/2024 1724   BILITOT 0.4 10/03/2024 1724   BILITOT <0.2 02/06/2018 1558   EGFR 83 07/08/2024 0858   GFRNONAA >60 10/04/2024 0901   GFRNONAA >89 02/09/2015 1032    Family Communication: Plan discussed with mother and father at bedside  Time spent: 35 minutes.  Author: Landon FORBES Baller, MD 10/05/2024 11:35 AM  For on call review [email or web address].

## 2024-10-05 NOTE — Anesthesia Procedure Notes (Signed)
 Procedure Name: Intubation Date/Time: 10/05/2024 9:04 AM  Performed by: Mollie Olivia SAUNDERS, CRNAPre-anesthesia Checklist: Patient identified, Emergency Drugs available, Suction available and Patient being monitored Patient Re-evaluated:Patient Re-evaluated prior to induction Oxygen Delivery Method: Circle System Utilized Preoxygenation: Pre-oxygenation with 100% oxygen Induction Type: IV induction Ventilation: Mask ventilation without difficulty Laryngoscope Size: Glidescope and 4 Grade View: Grade II Tube type: Oral Tube size: 7.5 mm Number of attempts: 1 Airway Equipment and Method: Stylet and Video-laryngoscopy Placement Confirmation: ETT inserted through vocal cords under direct vision, positive ETCO2 and breath sounds checked- equal and bilateral Secured at: 23 cm Tube secured with: Tape Dental Injury: Teeth and Oropharynx as per pre-operative assessment  Difficulty Due To: Difficult Airway- due to anterior larynx

## 2024-10-06 ENCOUNTER — Inpatient Hospital Stay (HOSPITAL_COMMUNITY)

## 2024-10-06 ENCOUNTER — Encounter (HOSPITAL_COMMUNITY): Payer: Self-pay | Admitting: Radiology

## 2024-10-06 DIAGNOSIS — I6389 Other cerebral infarction: Secondary | ICD-10-CM

## 2024-10-06 DIAGNOSIS — R269 Unspecified abnormalities of gait and mobility: Secondary | ICD-10-CM | POA: Diagnosis not present

## 2024-10-06 LAB — COMPREHENSIVE METABOLIC PANEL WITH GFR
ALT: 26 U/L (ref 0–44)
AST: 29 U/L (ref 15–41)
Albumin: 3.6 g/dL (ref 3.5–5.0)
Alkaline Phosphatase: 52 U/L (ref 38–126)
Anion gap: 10 (ref 5–15)
BUN: 12 mg/dL (ref 6–20)
CO2: 23 mmol/L (ref 22–32)
Calcium: 8.6 mg/dL — ABNORMAL LOW (ref 8.9–10.3)
Chloride: 96 mmol/L — ABNORMAL LOW (ref 98–111)
Creatinine, Ser: 1.07 mg/dL (ref 0.61–1.24)
GFR, Estimated: >60 mL/min (ref >60)
Glucose, Bld: 76 mg/dL (ref 70–99)
Potassium: 4.1 mmol/L (ref 3.5–5.1)
Sodium: 129 mmol/L — ABNORMAL LOW (ref 135–145)
Total Bilirubin: 0.6 mg/dL (ref 0.0–1.2)
Total Protein: 6.7 g/dL (ref 6.5–8.1)

## 2024-10-06 LAB — LIPID PANEL
Cholesterol: 135 mg/dL (ref 0–200)
HDL: 54 mg/dL
LDL Cholesterol: 56 mg/dL (ref 0–99)
Total CHOL/HDL Ratio: 2.5 ratio
Triglycerides: 123 mg/dL
VLDL: 25 mg/dL (ref 0–40)

## 2024-10-06 LAB — ECHOCARDIOGRAM COMPLETE
Area-P 1/2: 3.51 cm2
Height: 68 in
Weight: 3171.1 [oz_av]

## 2024-10-06 LAB — CBC
HCT: 40.6 % (ref 39.0–52.0)
Hemoglobin: 13.8 g/dL (ref 13.0–17.0)
MCH: 30.1 pg (ref 26.0–34.0)
MCHC: 34 g/dL (ref 30.0–36.0)
MCV: 88.5 fL (ref 80.0–100.0)
Platelets: 165 K/uL (ref 150–400)
RBC: 4.59 MIL/uL (ref 4.22–5.81)
RDW: 14.5 % (ref 11.5–15.5)
WBC: 7 K/uL (ref 4.0–10.5)
nRBC: 0 % (ref 0.0–0.2)

## 2024-10-06 MED ORDER — IOHEXOL 350 MG/ML SOLN
75.0000 mL | Freq: Once | INTRAVENOUS | Status: AC | PRN
  Administered 2024-10-06: 75 mL via INTRAVENOUS

## 2024-10-06 MED ORDER — STROKE: EARLY STAGES OF RECOVERY BOOK
Freq: Once | Status: DC
  Filled 2024-10-06: qty 1

## 2024-10-06 MED ORDER — LORAZEPAM 2 MG/ML IJ SOLN
0.5000 mg | Freq: Once | INTRAMUSCULAR | Status: AC
  Administered 2024-10-06: 0.5 mg via INTRAVENOUS
  Filled 2024-10-06: qty 1

## 2024-10-06 NOTE — Progress Notes (Signed)
 Consultation Progress Note   Patient: Nicholas Holt DOB: [birth year] DOA: 10/03/2024 DOS: the patient was seen and examined on 10/06/2024 Primary service: Faydra Korman, Landon BRAVO, MD  Brief hospital course: Nicholas Holt is a 42 y.o. male with medical history significant for brain cancer when he was a teenager and now has subsequent severe neurologic disability. He was brought in by his parents.  His mom reports that last night he was holding on to the walls to keep himself steady. This morning it was much worse and she thought he was going to fall even holding on to the wall.  He was very wobbly. Normally he can walk independently. He was also just repeating what she was saying over and over, not communicating clearly.  This was an abrupt change from 24 hours earlier so she brought him in for evaluation.   About a week ago he had a change in his medication.  His DDAVP  was changed from spray to pills.  He had been on DDAVP  spray for more than 15 years. The mom first called his endocrinologist and they were both worried about his sodium level so the endocrinologist advised bringing him to the ED.   Assessment and Plan:  Acute CVA   Acute gait instability and communication decline  - MRI shows 8 mm acute infarction in the posterior limb of the internal capsule on the left. - CTA H/N -->No large vessel occlusion in the head or neck.  -Echo pending. - Started on ASA and Plavix , LDL <70, HgbA1c pending -PT/OT/SLP ordered    2.  Panhypopituitarism -  - Because of the recent change from the DDAVP  spray to pills continue to follow his sodium.  Sodium trend--144-136--133--129 - Resume tablets - Continue to monitor Sodium - He follows with endocrinology -Family requested to hold off on labs this morning -Fluid Restriction -Patients endocrinologist-Dr Faythe; [phone]   3.  Mild obstructive sleep apnea - The patient was not able to tolerate CPAP mask even with his 6 mg of Ativan   at nightly   4.  Cognitive disability - Monitor for agitation during his hospital stay. Since he takes Ativan  nightly Ativan  will probably be the best way medical management.      TRH will continue to follow the patient.  Subjective: Seen at bedside, Patient was calm and had no complaints. Parents at bedside had multiple questions about patients acute stroke. They were answered to the best of my ability.   Physical Exam: Vitals:   10/05/24 1600 10/05/24 2203 10/06/24 0227 10/06/24 0826  BP: 128/88 (!) 146/97 132/64 120/76  Pulse: 63 68 61 71  Resp: 16 17 17 18   Temp: 98.2 F (36.8 C) 97.6 F (36.4 C) 97.6 F (36.4 C) 97.8 F (36.6 C)  TempSrc:    Axillary  SpO2: 100% 100% 98% 100%  Weight:      Height:      General: Alert in no acute distress. HEENT: Normocephalic, atraumatic, PERRL Cardiovascular: Normal rate and rhythm. Distal pulses intact. Pulmonary: Normal pulmonary effort, normal breath sounds Gastrointestinal: Nondistended abdomen, soft, non-tender, normoactive bowel sounds Musculoskeletal:Normal ROM, no lower ext edema Lymphadenopathy: No cervical LAD. Skin: Skin is warm and dry. PSYCH: Attentive and cooperative to the best of his ability.  Data Reviewed:    CBC    Component Value Date/Time   WBC 7.0 10/06/2024 0926   RBC 4.59 10/06/2024 0926   HGB 13.8 10/06/2024 0926   HGB 13.4 07/08/2024 0858   HCT 40.6 10/06/2024  0926   HCT 41.8 07/08/2024 0858   PLT 165 10/06/2024 0926   PLT 133 (L) 07/08/2024 0858   MCV 88.5 10/06/2024 0926   MCV 95 07/08/2024 0858   MCH 30.1 10/06/2024 0926   MCHC 34.0 10/06/2024 0926   RDW 14.5 10/06/2024 0926   RDW 13.1 07/08/2024 0858   LYMPHSABS 2.0 10/03/2024 1724   LYMPHSABS 3.0 08/19/2019 0934   MONOABS 0.4 10/03/2024 1724   EOSABS 0.1 10/03/2024 1724   EOSABS 0.1 08/19/2019 0934   BASOSABS 0.0 10/03/2024 1724   BASOSABS 0.0 08/19/2019 0934   CMP     Component Value Date/Time   NA 133 (L) 10/04/2024 0901   NA  144 07/08/2024 0858   K 3.9 10/04/2024 0901   CL 102 10/04/2024 0901   CO2 20 (L) 10/04/2024 0901   GLUCOSE 90 10/04/2024 0901   BUN 10 10/04/2024 0901   BUN 16 07/08/2024 0858   CREATININE 0.90 10/04/2024 0901   CREATININE 0.86 02/09/2015 1032   CALCIUM  8.6 (L) 10/04/2024 0901   CALCIUM  9.2 03/27/2008 1350   PROT 7.6 10/03/2024 1724   PROT 7.4 02/06/2018 1558   ALBUMIN 4.6 10/03/2024 1724   ALBUMIN 4.4 02/06/2018 1558   AST 36 10/03/2024 1724   ALT 36 10/03/2024 1724   ALKPHOS 65 10/03/2024 1724   BILITOT 0.4 10/03/2024 1724   BILITOT <0.2 02/06/2018 1558   EGFR 83 07/08/2024 0858   GFRNONAA >60 10/04/2024 0901   GFRNONAA >89 02/09/2015 1032    Family Communication: Plan discussed with mother and father at bedside  Time spent: 35 minutes.  Author: Landon FORBES Baller, MD 10/06/2024 11:22 AM  For on call review [email or web address].

## 2024-10-06 NOTE — Evaluation (Signed)
 Physical Therapy Evaluation Patient Details Name: Nicholas Holt MRN: [id number] DOB: Jan 26, 1981 Today's Date: 10/06/2024  History of Present Illness  Patient is 43 yo male presenting to the ED as a direct admit with acutely worsening balance and ambulation on 10/04/24. 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 CTA clear   PMH: suprasellar germinoma status post chemo and radiation therapy complicated by left-sided motor tic disorder and panhypopituitarism, hypertension, hyperlipidemia, anxiety, ADD, chronic ataxia  Clinical Impression  Pt admitted with above diagnosis. At baseline, pt ambulatory without AD and enjoys going to the gym and bowling.  He does live with mother and father who assist with iadls and other task due to some cognitive and mobility deficits from prior CVA but still is fairly independent.  Pt with L hand contracture and L ankle contracture wearing AFO from prior CVA after brain tumor.  Today, pt requiring mod-max A for transfers due to posterior lean.  He did ambulate 97' with PT but with mod A and unsafe gait pattern requiring frequent cues and stopping to regain balance and get close to RW, AFO in place.  Pt's strength difficult to assess completely due to unable to balance while sitting EOB, does appear to be slightly weaker on L compared to R - however, that may be baseline.   Pt currently with functional limitations due to the deficits listed below (see PT Problem List). Pt will benefit from acute skilled PT to increase their independence and safety with mobility to allow discharge.  Pt well below his baseline , has good support, and is motivated.  Patient will benefit from intensive inpatient follow-up therapy, >3 hours/day at d/c.          If plan is discharge home, recommend the following: Two people to help with walking and/or transfers;A lot of help with bathing/dressing/bathroom;Assistance with cooking/housework;Help  with stairs or ramp for entrance   Can travel by private vehicle        Equipment Recommendations Other (comment) (RW with platform vs single UE device - needs further assessment)  Recommendations for Other Services  Rehab consult    Functional Status Assessment Patient has had a recent decline in their functional status and demonstrates the ability to make significant improvements in function in a reasonable and predictable amount of time.     Precautions / Restrictions Precautions Precautions: Fall      Mobility  Bed Mobility Overal bed mobility: Needs Assistance Bed Mobility: Supine to Sit, Sit to Supine     Supine to sit: Used rails, HOB elevated, Mod assist Sit to supine: Min assist, Used rails, HOB elevated   General bed mobility comments: Increased time and cues with heavy use of rails for transfers; posterior lean requiring mod A at times    Transfers Overall transfer level: Needs assistance Equipment used: Rolling walker (2 wheels) Transfers: Sit to/from Stand Sit to Stand: From elevated surface, Mod assist           General transfer comment: Performed x 2 from elevated surface with cues for leaning forward (worked on reaching forward prior to standing) the mod A to stand and stabilize.  Heavy reliance on pushing into bed with legs to stand    Ambulation/Gait Ambulation/Gait assistance: Mod assist, +2 safety/equipment Gait Distance (Feet): 40 Feet Assistive device: Rolling walker (2 wheels) Gait Pattern/deviations: Step-to pattern, Decreased stride length, Ataxic, Shuffle, Decreased dorsiflexion - left, Narrow base of support Gait velocity: decreased  General Gait Details: Pt very unsteady requiring frequent cues and assist to stay close to RW and stopping several times to reset and get RW close.  Tending to drag L LE behind and walk on toe (with AFO in place)- could partially correct with cues to get L foot forward and flat.  Pt requiring support  posterior at times but also assist to slow forward momentum.  Assist to navigate RW in turns and around obstacles. Difficult keeping L hand on RW due to contractures.  Stairs            Wheelchair Mobility     Tilt Bed    Modified Rankin (Stroke Patients Only) Modified Rankin (Stroke Patients Only) Pre-Morbid Rankin Score: Slight disability Modified Rankin: Moderately severe disability     Balance Overall balance assessment: Needs assistance Sitting-balance support: Bilateral upper extremity supported, Feet supported Sitting balance-Leahy Scale: Poor Sitting balance - Comments: posterior lean requring up to max A to correct -could improve with cues to reach forward but difficulty maintaining   Standing balance support: Bilateral upper extremity supported, During functional activity, Reliant on assistive device for balance   Standing balance comment: reliant on PT and RW, with min to mod A -                             Pertinent Vitals/Pain Pain Assessment Pain Assessment: No/denies pain    Home Living Family/patient expects to be discharged to:: Private residence Living Arrangements: Parent Available Help at Discharge: Available 24 hours/day Type of Home: House Home Access: Stairs to enter Entrance Stairs-Rails: None Entrance Stairs-Number of Steps: 2 Alternate Level Stairs-Number of Steps: 14 Home Layout: Two level;Bed/bath upstairs Home Equipment: [employer] (2 wheels);Cane - single point      Prior Function Prior Level of Function : Needs assist             Mobility Comments: typically walks without assist; can ambulate in community ADLs Comments: Father helps with his bath, able to dress, mother checks behind him, loves to play games (handheld system) loves bowling and [facility] [employer]     Extremity/Trunk Assessment   Upper Extremity Assessment Upper Extremity Assessment: Right hand dominant;LUE deficits/detail LUE Deficits / Details: L  hand contracted at wrist and with decreased strength from previous CVA post brain tumor per father LUE Sensation: WNL LUE Coordination: decreased gross motor;decreased fine motor    Lower Extremity Assessment Lower Extremity Assessment: LLE deficits/detail;RLE deficits/detail RLE Deficits / Details: Somewhat difficult to assess with assist of 1 at EOB as pt with posteior lean.  ROM is [facility]; MMT: grossly 4/5 throughout LLE Deficits / Details: Somewhat difficult to assess with assist of 1 at EOB as pt with posteior lean.  Does have some deficits from prior CVA after brain tumor with limited L ankle ROM wearing AFO to ambulate and family reports L leg a little weaker than R baseline.  ROM: functional other than very limited ankle ROM with tight end feel all directions.  MMT: ankle 3/5, knee and hip 4-/5    Cervical / Trunk Assessment Cervical / Trunk Assessment: Kyphotic  Communication   Communication Communication: Impaired Factors Affecting Communication: Reduced clarity of speech    Cognition Arousal: Alert Behavior During Therapy: WFL for tasks assessed/performed   PT - Cognitive impairments: Difficult to assess  Following commands: Impaired Following commands impaired: Follows one step commands inconsistently, Follows one step commands with increased time     Cueing Cueing Techniques: Verbal cues     General Comments General comments (skin integrity, edema, etc.): Mother and Father present and supportive    Exercises     Assessment/Plan    PT Assessment Patient needs continued PT services  PT Problem List Decreased strength;Decreased mobility;Decreased safety awareness;Decreased range of motion;Decreased knowledge of precautions;Decreased activity tolerance;Decreased balance;Decreased knowledge of use of DME;Decreased cognition       PT Treatment Interventions DME instruction;Therapeutic exercise;Gait training;Stair training;Functional  mobility training;Therapeutic activities;Patient/family education;Balance training;Neuromuscular re-education    PT Goals (Current goals can be found in the Care Plan section)  Acute Rehab PT Goals Patient Stated Goal: Family interested in post acute rehab prior to home PT Goal Formulation: With patient/family Time For Goal Achievement: 10/20/24 Potential to Achieve Goals: Good    Frequency Min 3X/week     Co-evaluation               AM-PAC PT 6 Clicks Mobility  Outcome Measure Help needed turning from your back to your side while in a flat bed without using bedrails?: A Lot Help needed moving from lying on your back to sitting on the side of a flat bed without using bedrails?: A Lot Help needed moving to and from a bed to a chair (including a wheelchair)?: A Lot Help needed standing up from a chair using your arms (e.g., wheelchair or bedside chair)?: A Lot Help needed to walk in hospital room?: A Lot Help needed climbing 3-5 steps with a railing? : Total 6 Click Score: 11    End of Session Equipment Utilized During Treatment: Gait belt Activity Tolerance: Patient tolerated treatment well Patient left: in bed;with call bell/phone within reach;with bed alarm set;with family/visitor present Nurse Communication: Mobility status PT Visit Diagnosis: Other abnormalities of gait and mobility (R26.89);Muscle weakness (generalized) (M62.81);Other symptoms and signs involving the nervous system (R29.898)    Time: 8343-8275 PT Time Calculation (min) (ACUTE ONLY): 28 min   Charges:   PT Evaluation $PT Eval Moderate Complexity: 1 Mod PT Treatments $Gait Training: 8-22 mins PT General Charges $$ ACUTE PT VISIT: 1 Visit         Benjiman, PT Acute Rehab [facility] Rehab [phone]   Benjiman VEAR Mulberry 10/06/2024, 5:53 PM

## 2024-10-06 NOTE — Progress Notes (Addendum)
 Pt pre-medicated with IV ativan  per order for CT angiogram once transport put in. Pt calm, alert, and awake at this time. Mother and father at bedside. Mother will accompany pt to CT scan. Radiology tech notified of medication administration and mother accompanying pt to scan.    01:00 pt returned from CT scan. Calm. Per parents, scan went well, pt tolerated well. Pt's urine output is clear, barely any noticeable yellow color. Per mother pt has been increasingly thirsty over the past 12 hrs. Mother understands excessive thirst can occur in the setting of diabetes insipidus. RN asked parents to monitor water intake to avoid excessive water intake in the setting of low sodium to avoid worsening hyponatremia and water intoxication. Mother verbalizes agreement and understanding. Pt taking DDVP as ordered. Mother states she is going to call pt's endocrinologist 11/24 to inquire about switching from PO DDVP back to the intranasal DDVP. Currently pt calm, resting in bed. Fall precautions in place, parents at bedside.   02:00 family requesting 4AM vitals be skipped so pt can sleep.   05:00 family refused phlebotomy to perform venipuncture for AM labs. Asked that lab come back at later time.

## 2024-10-06 NOTE — Plan of Care (Signed)

## 2024-10-06 NOTE — Progress Notes (Signed)
  Echocardiogram 2D Echocardiogram has been performed.  Nicholas Holt 10/06/2024, 12:07 PM

## 2024-10-06 NOTE — Care Management Important Message (Signed)
 Important Message  Patient Details  Name: Nicholas Holt MRN: [id number] Date of Birth: [birth year]   Important Message Given:  Yes - Medicare IM     Jennie Laneta Dragon 10/06/2024, 1:34 PM

## 2024-10-06 NOTE — Evaluation (Signed)
 Occupational Therapy Evaluation Patient Details Name: Nicholas Holt MRN: [id number] DOB: 06/27/1981 Today's Date: 10/06/2024   History of Present Illness   Patient is 43 yo male presenting to the ED as a direct admit with acutely worsening balance and ambulation on 10/04/24. 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 His father would assist with bathing for thoroughness. Currently, patient with need for mod A for ADL management, and mod A from an elevated surface to achieve standing. Patient with significant posterior lean in standing, therefore unable to attempt ambulation with only one person as support. Patient able to scoot to [facility] at supervision level, however requiring increased time and repetitions in order to be able to complete. Given prior level, and patient's 24/7 support, OT recommending intensive rehab >3 hours prior to discharge home. OT will continue to follow.      If plan is discharge home, recommend the following:   Two people to help with walking and/or transfers;A lot of help with bathing/dressing/bathroom;Assistance with cooking/housework;Direct supervision/assist for medications management;Direct supervision/assist for financial management;Assist for transportation;Help with stairs or ramp for entrance;Supervision due to cognitive status     Functional Status Assessment   Patient has had a recent decline in their functional status and demonstrates the ability to make significant improvements in function in a reasonable and predictable amount of time.      Equipment Recommendations   Other (comment) (defer to next venue)     Recommendations for Other Services   Rehab consult     Precautions/Restrictions   Precautions Precautions: Fall Recall of Precautions/Restrictions: Impaired Restrictions Weight Bearing Restrictions Per Provider Order: No     Mobility Bed Mobility Overal bed mobility: Needs Assistance Bed Mobility: Supine to Sit, Sit to Supine     Supine to sit: Min assist, HOB elevated Sit to supine: Min assist   General bed mobility comments: Min A to transition into sitting, up to max A to maintain sitting balance when urinating, with posterior lean noted and cues to correct, min A to return BLEs back into bed, and able to complete multiple incremental scoots towards HOB    Transfers Overall transfer level: Needs assistance Equipment used: Rolling walker (2 wheels) Transfers: Sit to/from Stand Sit to Stand: From elevated surface, Mod assist           General transfer comment: Able to complete x2 with elevated surface at mod A, heavy posterior lean with patient unable to correct, unable to shift weight or take side steps      Balance Overall balance assessment: Needs assistance Sitting-balance support: Bilateral upper extremity supported, Feet supported Sitting balance-Leahy Scale: Poor Sitting balance - Comments: posterior lean requring up to max A to correct Postural control: Posterior lean Standing balance support: Bilateral upper extremity supported, During functional activity, Reliant on assistive device for balance Standing balance-Leahy Scale: Poor Standing balance comment: reliant on OT and RW                           ADL either performed or assessed with clinical judgement   ADL Overall ADL's :  Needs assistance/impaired Eating/Feeding: Moderate assistance;Bed level Eating/Feeding Details (indicate cue type and reason): father feeding, but can usually complete himself Grooming:  Minimal assistance;Sitting   Upper Body Bathing: Minimal assistance;Sitting   Lower Body Bathing: Maximal assistance;Sitting/lateral leans;Sit to/from stand   Upper Body Dressing : Minimal assistance;Sitting   Lower Body Dressing: Maximal assistance;Sit to/from stand;Sitting/lateral leans   Toilet Transfer: Moderate assistance;Stand-pivot;BSC/3in1;Rolling walker (2 wheels) Toilet Transfer Details (indicate cue type and reason): simulated from bed Toileting- Clothing Manipulation and Hygiene: Moderate assistance;Sitting/lateral lean;Sit to/from stand       Functional mobility during ADLs: Moderate assistance;+2 for physical assistance;+2 for safety/equipment;Cueing for safety;Cueing for sequencing;Rolling walker (2 wheels) General ADL Comments: Prior to this admission, patient living with his parents, able to ambulate without device, and able to complete dressing on his own. His father would assist with bathing for thoroughness. Currently, patient with need for mod A for ADL management, and mod A from an elevated surface to achieve standing. Patient with significant posterior lean in standing, therefore unable to attempt ambulation with only one person as support. Patient able to scoot to [facility] at supervision level, however requiring increased time and repetitions in order to be able to complete. Given prior level, and patient's 24/7 support, OT recommending intensive rehab >3 hours prior to discharge home. OT will continue to follow.     Vision Baseline Vision/History: 1 Wears glasses Ability to See in Adequate Light: 0 Adequate Patient Visual Report: No change from baseline Vision Assessment?: No apparent visual deficits;Wears glasses for reading     Perception Perception: Not tested       Praxis Praxis: Not tested       Pertinent Vitals/Pain Pain Assessment Pain Assessment: Faces Faces Pain Scale: No hurt Pain Intervention(s): Monitored during session, Limited activity within  patient's tolerance, Repositioned     Extremity/Trunk Assessment Upper Extremity Assessment Upper Extremity Assessment: Right hand dominant;LUE deficits/detail LUE Deficits / Details: L hand contracted at wrist and with decreased strength from previous CVA post brain tumor per father LUE Sensation: WNL LUE Coordination: decreased gross motor;decreased fine motor   Lower Extremity Assessment Lower Extremity Assessment: Defer to PT evaluation   Cervical / Trunk Assessment Cervical / Trunk Assessment: Kyphotic (minimally)   Communication Communication Communication: Impaired Factors Affecting Communication: Reduced clarity of speech ([place])   Cognition Arousal: Alert Behavior During Therapy: WFL for tasks assessed/performed Cognition: History of cognitive impairments             OT - Cognition Comments: Cognitvely impaired at baseline, patient's family reports he is at his baseline                 Following commands: Impaired Following commands impaired: Follows one step commands inconsistently, Follows one step commands with increased time     Cueing  General Comments   Cueing Techniques: Verbal cues      Exercises     Shoulder Instructions      Home Living Family/patient expects to be discharged to:: Private residence Living Arrangements: Parent Available Help at Discharge: Available 24 hours/day Type of Home: House Home Access: Stairs to enter [employer] of Steps: 2 Entrance Stairs-Rails: None Home Layout: Two level Alternate Level Stairs-Number of Steps: 14 Alternate Level Stairs-Rails: Right Bathroom Shower/Tub: Chief Strategy Officer: Handicapped height     Home Equipment: None          Prior Functioning/Environment Prior Level of Function : Needs assist  Mobility Comments: typically walks without assist ADLs Comments: Father helps with his bath, able to dress, mother checks behind him, loves to  play games (handheld system) loves bowling and [facility] [employer]    OT Problem List: Decreased strength;Decreased activity tolerance;Impaired balance (sitting and/or standing);Decreased coordination;Decreased safety awareness;Decreased knowledge of use of DME or AE   OT Treatment/Interventions: Self-care/ADL training;Therapeutic exercise;Energy conservation;DME and/or AE instruction;Manual therapy;Therapeutic activities;Patient/family education;Balance training      OT Goals(Current goals can be found in the care plan section)   Acute Rehab OT Goals Patient Stated Goal: Per father, regain independence OT Goal Formulation: With family Time For Goal Achievement: 10/20/24 Potential to Achieve Goals: Good ADL Goals Pt Will Perform Lower Body Bathing: with min assist;sitting/lateral leans;sit to/from stand Pt Will Perform Lower Body Dressing: with min assist;sitting/lateral leans;sit to/from stand Pt Will Transfer to Toilet: with min assist;stand pivot transfer;bedside commode Pt Will Perform Toileting - Clothing Manipulation and hygiene: with min assist;sitting/lateral leans;sit to/from stand Additional ADL Goal #1: Patient will be able to demonstrate increased awareness to be able to correct sitting or standing balance without cues. Additional ADL Goal #2: Patient will be able to complete functional task in standing for 3-5 minutes prior to needing seated rest break in order to improve overall activity tolerance.   OT Frequency:  Min 2X/week    Co-evaluation              AM-PAC OT 6 Clicks Daily Activity     Outcome Measure Help from another person eating meals?: A Lot Help from another person taking care of personal grooming?: A Little Help from another person toileting, which includes using toliet, bedpan, or urinal?: A Lot Help from another person bathing (including washing, rinsing, drying)?: A Lot Help from another person to put on and taking off regular upper body clothing?:  A Little Help from another person to put on and taking off regular lower body clothing?: A Lot 6 Click Score: 14   End of Session Equipment Utilized During Treatment: Gait belt;Rolling walker (2 wheels) Nurse Communication: Mobility status  Activity Tolerance: Patient tolerated treatment well Patient left: in bed;with call bell/phone within reach;with bed alarm set;with family/visitor present  OT Visit Diagnosis: Unsteadiness on feet (R26.81);Other abnormalities of gait and mobility (R26.89);Muscle weakness (generalized) (M62.81);History of falling (Z91.81)                Time: 1431-1500 OT Time Calculation (min): 29 min Charges:  OT General Charges $OT Visit: 1 Visit OT Evaluation $OT Eval Moderate Complexity: 1 Mod OT Treatments $Self Care/Home Management : 8-22 mins  Ronal Gift E. Lucciana Head, OTR/L Acute Rehabilitation Services [phone]   Ronal Gift Salt 10/06/2024, 4:06 PM

## 2024-10-06 NOTE — Progress Notes (Signed)
 Transition of Care [facility]) - Inpatient Brief Assessment   Patient Details  Name: Nicholas Holt MRN: [id number] Date of Birth: 1981/10/28  Transition [facility] CM/SW Contact:    Rosaline JONELLE Joe, RN Phone Number: 10/06/2024, 2:39 PM   Clinical Narrative: CM met with the patient and parents at the bedside to discuss IP Care management needs.  The patient lives at home with parents who provide 24 hour care in the home.  DME in the home includes CPAP with full masK (unable to wear and tolerate), and Left lower leg brace.  Patient's mother expressed interest for personal care services in the home.  The patient's mother was provided [facility] services document that she can follow up with PCP to fill out and send to [facility] for evaluation for services.  Patient is currently waiting on PT, OT evaluation at this time.   Transition of Care Asessment: Insurance and Status: (P) Insurance coverage has been reviewed Patient has primary care physician: (P) Yes Home environment has been reviewed: (P) from home with mother/father Prior level of function:: (P) family assistance Prior/Current Home Services: (P) No current home services Social Drivers of Health Review: (P) SDOH reviewed needs interventions Readmission risk has been reviewed: (P) Yes Transition of care needs: (P) transition of care needs identified, TOC will continue to follow

## 2024-10-06 NOTE — Plan of Care (Signed)
   Problem: Education: Goal: Knowledge of General Education information will improve Description: Including pain rating scale, medication(s)/side effects and non-pharmacologic comfort measures Outcome: Progressing   Problem: Clinical Measurements: Goal: Will remain free from infection Outcome: Progressing   Problem: Clinical Measurements: Goal: Diagnostic test results will improve Outcome: Progressing

## 2024-10-07 DIAGNOSIS — R269 Unspecified abnormalities of gait and mobility: Secondary | ICD-10-CM | POA: Diagnosis not present

## 2024-10-07 LAB — COMPREHENSIVE METABOLIC PANEL WITH GFR
ALT: 31 U/L (ref 0–44)
AST: 32 U/L (ref 15–41)
Albumin: 4 g/dL (ref 3.5–5.0)
Alkaline Phosphatase: 56 U/L (ref 38–126)
Anion gap: 10 (ref 5–15)
BUN: 12 mg/dL (ref 6–20)
CO2: 22 mmol/L (ref 22–32)
Calcium: 9.3 mg/dL (ref 8.9–10.3)
Chloride: 106 mmol/L (ref 98–111)
Creatinine, Ser: 1.08 mg/dL (ref 0.61–1.24)
GFR, Estimated: >60 mL/min (ref >60)
Glucose, Bld: 78 mg/dL (ref 70–99)
Potassium: 4.9 mmol/L (ref 3.5–5.1)
Sodium: 138 mmol/L (ref 135–145)
Total Bilirubin: 0.5 mg/dL (ref 0.0–1.2)
Total Protein: 7.5 g/dL (ref 6.5–8.1)

## 2024-10-07 LAB — HEMOGLOBIN A1C
Hgb A1c MFr Bld: 6 % — ABNORMAL HIGH (ref 4.8–5.6)
Mean Plasma Glucose: 126 mg/dL

## 2024-10-07 MED ORDER — DESMOPRESSIN ACETATE 0.1 MG PO TABS
0.1000 mg | ORAL_TABLET | Freq: Three times a day (TID) | ORAL | Status: DC
  Administered 2024-10-07 – 2024-10-10 (×8): 0.1 mg via ORAL
  Filled 2024-10-07 (×9): qty 1

## 2024-10-07 NOTE — Progress Notes (Signed)
 Physical Therapy Treatment Patient Details Name: Nicholas Holt MRN: [id number] DOB: 12/30/1980 Today's Date: 10/07/2024   History of Present Illness 43 yo M adm 10/04/24 with impaired balance with posterior limb internal capsule infarct. PMH: suprasellar germinoma s/p chemo with left-sided motor tic disorder, HTN, HLD anxiety, ADD, ataxia, seizure disorder, OSA    PT Comments  Nicholas Holt pleasant with parents present throughout session. Pt required assist to don socks, shoes, AFO with continued posterior bias, impaired gait and balance. Pt tolerating increased gait with single UE support and worked on anterior translation for transfers. Encouraged continued supervised attempts at anterior translation in sitting to progress transfers. Will continue to follow. Patient will benefit from intensive inpatient follow-up therapy, >3 hours/day     If plan is discharge home, recommend the following: Two people to help with walking and/or transfers;A lot of help with bathing/dressing/bathroom;Assistance with cooking/housework;Help with stairs or ramp for entrance   Can travel by private vehicle        Equipment Recommendations  Other (comment) (TBD with progression)    Recommendations for Other Services       Precautions / Restrictions Precautions Precautions: Fall Recall of Precautions/Restrictions: Impaired Precaution/Restrictions Comments: Lt AFo     Mobility  Bed Mobility Overal bed mobility: Needs Assistance Bed Mobility: Supine to Sit     Supine to sit: Mod assist     General bed mobility comments: mod assist to clear legs and elevate trunk from surface with bed flat. Pt with posterior bias with continued mod assist for balance EOB    Transfers Overall transfer level: Needs assistance   Transfers: Sit to/from Stand Sit to Stand: Mod assist           General transfer comment: mod assist to rise from elevated surface and repeated 5 trials from chair with progression to  min assist. Pt needs multimodal cues and assist for anterior translation to rise to standing    Ambulation/Gait Ambulation/Gait assistance: Mod assist, +2 safety/equipment Gait Distance (Feet): 150 Feet Assistive device: Rolling walker (2 wheels), 1 person hand held assist Gait Pattern/deviations: Step-to pattern, Decreased stride length, Ataxic, Shuffle, Decreased dorsiflexion - left, Narrow base of support   Gait velocity interpretation: <1.8 ft/sec, indicate of risk for recurrent falls   General Gait Details: pt with Lt AFO donned in sitting and initiated gait with RW with pt grasping LUE with thumb and forefinger but demonstrated decreased control and safety with RW after 30' transitioned to RUE hand held assist. Pt toe walking on LLE with rushed gait needing mod assist for balance to prevent fall and max cues for safety and direction. Mom confirms pt normally toe walks but not as severely and was not rushing with gait PTA   Stairs             Wheelchair Mobility     Tilt Bed    Modified Rankin (Stroke Patients Only) Modified Rankin (Stroke Patients Only) Pre-Morbid Rankin Score: Slight disability Modified Rankin: Moderately severe disability     Balance Overall balance assessment: Needs assistance Sitting-balance support: Bilateral upper extremity supported, Feet supported Sitting balance-Leahy Scale: Poor Sitting balance - Comments: mod assist sitting balance   Standing balance support: Bilateral upper extremity supported, During functional activity, Reliant on assistive device for balance, Single extremity supported Standing balance-Leahy Scale: Poor Standing balance comment: physical assist for safety and balance  Communication Communication Communication: Impaired Factors Affecting Communication: Reduced clarity of speech  Cognition Arousal: Alert Behavior During Therapy: Flat affect   PT - Cognitive impairments:  Difficult to assess, Safety/Judgement, Problem solving                       PT - Cognition Comments: pt with baseline deficits with increased expressive deficits and decreased safety awareness per family Following commands: Impaired Following commands impaired: Follows one step commands inconsistently, Follows one step commands with increased time    Cueing Cueing Techniques: Verbal cues, Gestural cues  Exercises      General Comments        Pertinent Vitals/Pain Pain Assessment Pain Assessment: No/denies pain    Home Living                          Prior Function            PT Goals (current goals can now be found in the care plan section) Progress towards PT goals: Progressing toward goals    Frequency    Min 3X/week      PT Plan      Co-evaluation              AM-PAC PT 6 Clicks Mobility   Outcome Measure  Help needed turning from your back to your side while in a flat bed without using bedrails?: A Little Help needed moving from lying on your back to sitting on the side of a flat bed without using bedrails?: A Lot Help needed moving to and from a bed to a chair (including a wheelchair)?: A Lot Help needed standing up from a chair using your arms (e.g., wheelchair or bedside chair)?: A Lot Help needed to walk in hospital room?: A Lot Help needed climbing 3-5 steps with a railing? : Total 6 Click Score: 12    End of Session Equipment Utilized During Treatment: Gait belt Activity Tolerance: Patient tolerated treatment well Patient left: in chair;with family/visitor present;with call bell/phone within reach;Other (comment) (with SLP) Nurse Communication: Mobility status PT Visit Diagnosis: Other abnormalities of gait and mobility (R26.89);Muscle weakness (generalized) (M62.81);Other symptoms and signs involving the nervous system (R29.898);Difficulty in walking, not elsewhere classified (R26.2)     Time: 9190-9163 PT Time  Calculation (min) (ACUTE ONLY): 27 min  Charges:    $Gait Training: 8-22 mins $Therapeutic Activity: 8-22 mins PT General Charges $$ ACUTE PT VISIT: 1 Visit                     Nicholas Holt, PT Acute Rehabilitation Services Office: [phone]    Nicholas Holt 10/07/2024, 10:52 AM

## 2024-10-07 NOTE — Progress Notes (Signed)
 Inpatient Rehab Coordinator Note:  I met with patient and his mom/step dad at bedside to discuss CIR recommendations and goals/expectations of CIR stay.  We reviewed 3 hrs/day of therapy, physician follow up, and average length of stay 2 weeks (dependent upon progress) with goals of supervision.  Family cannot provide physical assist but can provide 24/7 supervision.  Discussed SNF vs. CIR and family want CIR.  I've asked rehab MD to see for consult and to open insurance auth.   Reche Lowers, PT, DPT Admissions Coordinator [phone] 10/07/24 1:44 PM

## 2024-10-07 NOTE — Anesthesia Postprocedure Evaluation (Signed)
 Anesthesia Post Note  Patient: Nicholas Holt  Procedure(s) Performed: MRI WITH ANESTHESIA     Patient location during evaluation: PACU Anesthesia Type: General Level of consciousness: awake and patient cooperative Pain management: pain level controlled Vital Signs Assessment: post-procedure vital signs reviewed and stable Respiratory status: spontaneous breathing, nonlabored ventilation and respiratory function stable Cardiovascular status: blood pressure returned to baseline and stable Postop Assessment: no apparent nausea or vomiting Anesthetic complications: no   No notable events documented.                  Ianna Salmela

## 2024-10-07 NOTE — Progress Notes (Signed)
 Consultation Progress Note   Patient: Nicholas Holt FMW:[id number] DOB: 1981/02/07 DOA: 10/03/2024 DOS: the patient was seen and examined on 10/07/2024 Primary service: Ryver Poblete, Landon BRAVO, MD  Brief hospital course: Nicholas Holt is a 43 y.o. male with medical history significant for brain cancer when he was a teenager and now has subsequent severe neurologic disability. He was brought in by his parents.  His mom reports that last night he was holding on to the walls to keep himself steady. This morning it was much worse and she thought he was going to fall even holding on to the wall.  He was very wobbly. Normally he can walk independently. He was also just repeating what she was saying over and over, not communicating clearly.  This was an abrupt change from 24 hours earlier so she brought him in for evaluation.   About a week ago he had a change in his medication.  His DDAVP  was changed from spray to pills.  He had been on DDAVP  spray for more than 15 years. The mom first called his endocrinologist and they were both worried about his sodium level so the endocrinologist advised bringing him to the ED.   Assessment and Plan: Acute CVA   Acute gait instability and communication decline - MRI shows 8 mm acute infarction in the posterior limb of the internal capsule on the left. 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  -Echo reviewed, ejection fraction of  60 to 65%.  The left ventricle has normal function. The left ventricle has no regional wall motion abnormalities.   -The interatrial septum was not well visualized.  -Neurology consulted, they recommended DAPT for 3 weeks and then plavix  alone.   LDL <70, HgbA1c- 6.0 -PT/OT/SLP ordered    2.  Panhypopituitarism -patient is on hydrocortisone , levothyroxine  and DDAVP  Follows with Endo-Dr  [phone] -Family reports recent change from the DDAVP  spray to pills. -Patient still with intact thirst -Sodium trend since admission --144-136--133--129--138 - Patient was on 0.2mg  of DDAVP  TID, spoke with his primary endocrinologist on 11/25 about his downtrending sodium and he recommended reducing dose of DDAVP  to 0.1mg  TID. -He also advised not to restrict fluids, but to allow patient to have fluids when he wants to drink and should not be allowed to drink excessively. - Continue to monitor Sodium - After reviewing patient's chart Dr. Faythe recommended skipping a.m. dose of DDAVP      3.  Mild obstructive sleep apnea - The patient was not able to tolerate CPAP mask even with his 6 mg of Ativan  at nightly   4.  Cognitive disability - Monitor for agitation during his hospital stay. Since he takes Ativan  nightly Ativan  will probably be the best way medical management.      [facility] will continue to follow the patient.  Subjective: Patient seen at bedside this morning, no acute overnight events.  Mother is concerned that patient has been drinking excessively, she states that his urine appeared like water has never been the case at home.  This morning patient's sodium improved to 138, would repeat labs in the morning to ensure stability prior to being discharged.   Physical Exam: Vitals:   10/06/24 2015 10/07/24 0016 10/07/24 0447 10/07/24 0758  BP: 136/78 (!) 127/91 110/73 117/79  Pulse: 75 86 74 78  Resp: 20 20 20 18   Temp: 97.6 F (36.4 C)  97.7 F (36.5 C) 97.6 F (36.4 C) (!) 97.5 F (36.4 C)  TempSrc:      SpO2: 100% 100% 100% 100%  Weight:      Height:      General: Alert in no acute distress. HEENT: Normocephalic, atraumatic, PERRL Cardiovascular: Normal rate and rhythm. Distal pulses intact. Pulmonary: Normal pulmonary effort, normal breath sounds Gastrointestinal: Nondistended abdomen, soft, non-tender, normoactive bowel sounds Musculoskeletal:Normal ROM, no  lower ext edema Lymphadenopathy: No cervical LAD. Neuro-Alert and oriented x 2-3, moving all 4 extremities Skin: Skin is warm and dry. PSYCH: Attentive and cooperative to the best of his ability.  Data Reviewed:    CBC    Component Value Date/Time   WBC 7.0 10/06/2024 0926   RBC 4.59 10/06/2024 0926   HGB 13.8 10/06/2024 0926   HGB 13.4 07/08/2024 0858   HCT 40.6 10/06/2024 0926   HCT 41.8 07/08/2024 0858   PLT 165 10/06/2024 0926   PLT 133 (L) 07/08/2024 0858   MCV 88.5 10/06/2024 0926   MCV 95 07/08/2024 0858   MCH 30.1 10/06/2024 0926   MCHC 34.0 10/06/2024 0926   RDW 14.5 10/06/2024 0926   RDW 13.1 07/08/2024 0858   LYMPHSABS 2.0 10/03/2024 1724   LYMPHSABS 3.0 08/19/2019 0934   MONOABS 0.4 10/03/2024 1724   EOSABS 0.1 10/03/2024 1724   EOSABS 0.1 08/19/2019 0934   BASOSABS 0.0 10/03/2024 1724   BASOSABS 0.0 08/19/2019 0934   CMP     Component Value Date/Time   NA 129 (L) 10/06/2024 0926   NA 144 07/08/2024 0858   K 4.1 10/06/2024 0926   CL 96 (L) 10/06/2024 0926   CO2 23 10/06/2024 0926   GLUCOSE 76 10/06/2024 0926   BUN 12 10/06/2024 0926   BUN 16 07/08/2024 0858   CREATININE 1.07 10/06/2024 0926   CREATININE 0.86 02/09/2015 1032   CALCIUM  8.6 (L) 10/06/2024 0926   CALCIUM  9.2 03/27/2008 1350   PROT 6.7 10/06/2024 0926   PROT 7.4 02/06/2018 1558   ALBUMIN 3.6 10/06/2024 0926   ALBUMIN 4.4 02/06/2018 1558   AST 29 10/06/2024 0926   ALT 26 10/06/2024 0926   ALKPHOS 52 10/06/2024 0926   BILITOT 0.6 10/06/2024 0926   BILITOT <0.2 02/06/2018 1558   EGFR 83 07/08/2024 0858   GFRNONAA >60 10/06/2024 0926   GFRNONAA >89 02/09/2015 1032    Family Communication: Plan discussed with mother and father at bedside  Time spent: 35 minutes.  Author: Landon FORBES Baller, MD 10/07/2024 8:37 AM  For on call review [email or web address].

## 2024-10-07 NOTE — Progress Notes (Signed)
 Inpatient Rehab Admissions Coordinator Note:   Per therapy recommendations patient was screened for CIR candidacy by Reche FORBES Lowers, PT. At this time, pt appears to be a potential candidate for CIR. I will place an order for rehab consult for full assessment, per our protocol.  Please contact me any with questions.SABRA Reche Lowers, PT, DPT [phone] 10/07/24 9:32 AM

## 2024-10-07 NOTE — Plan of Care (Signed)
  Problem: Activity: °Goal: Risk for activity intolerance will decrease °Outcome: Progressing °  °Problem: Nutrition: °Goal: Adequate nutrition will be maintained °Outcome: Progressing °  °Problem: Coping: °Goal: Level of anxiety will decrease °Outcome: Progressing °  °Problem: Elimination: °Goal: Will not experience complications related to bowel motility °Outcome: Progressing °  °Problem: Skin Integrity: °Goal: Risk for impaired skin integrity will decrease °Outcome: Progressing °  °

## 2024-10-07 NOTE — Plan of Care (Signed)
  Problem: Health Behavior/Discharge Planning: Goal: Ability to manage health-related needs will improve Outcome: Progressing   Problem: Education: Goal: Knowledge of General Education information will improve Description: Including pain rating scale, medication(s)/side effects and non-pharmacologic comfort measures Outcome: Progressing   Problem: Nutrition: Goal: Adequate nutrition will be maintained Outcome: Progressing   Problem: Coping: Goal: Level of anxiety will decrease Outcome: Progressing   Problem: Elimination: Goal: Will not experience complications related to bowel motility Outcome: Progressing Goal: Will not experience complications related to urinary retention Outcome: Progressing

## 2024-10-07 NOTE — Consult Note (Signed)
 Physical Medicine and Rehabilitation Consult Reason for Consult:CIR Referring Physician: Dr Carlota   HPI: Nicholas Holt is a 43 y.o. male  with hx of brain CA with severe neuro issues and chronic L hemiparesis who was able to walk on his own at home with L AFO and no Assistive device. Also has remote hx of seizures, HTN, and chronic  low sodium and panhypopituitarism; OSA_ doesn't use CPAP- cannot tolerate.   He was admitted 11/21 with AMS and grabbing on wall to walk and dysarthria- they assumed due to change in DDVAP from nasal spray to oral form.  However found to have a L posterior limb internal capsule stroke- (L inferior BG stroke).  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  Pt Na and electrolytes were maximized, however has HbA1c of 6.0- and changes on ECHO.  They've attempted to treat him for cramps in his LEs- esp RLE, but both, which has had in past, but much worse currently than normal per mother.        Review of Systems  Unable to perform ROS: Mental acuity   cannot  obtain due to cognition Past Medical History:  Diagnosis Date   Anxiety    Attention deficit disorder 08/08/2006   Bilateral leg cramps 11/25/2006   Blood transfusion without reported diagnosis 1997   Chemotherapy associated   Brain cancer (HCC) 1998   Cough 12/08/2015   COVID-19 10/16/2017   Elevated hemidiaphragm 07/09/2015   Left, presumably after portacath placement.    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  Complicated by left sided motor tic disorder and panhypopituitarism.    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  Includes central diabetes insipidus, secondary hypothyroidism, secondary adrenal insufficiency, and secondary hypogonadism.    Perennial allergic rhinitis 07/09/2015   Pre-diabetes    no meds   Preventative health care 07/09/2015   Seizure disorder (HCC) 08/08/2006   Predated brain tumor.  Generalized seizure ocurred after running out of lorazepam  post-tumor.    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 Treated with alendronate  from 2009-2016. DEXA (03/16/2008): L Spine T -2.0. L fem neck T -0.9, R fem neck T -1.9.  DEXA (10/15/2014): L Spine T -1.3, L fem neck T -0.6, R fem neck T -1.8.  Bisphosphonate holiday 06/2015.  Repeat DEXA scan in 10/2016 to reassess.    Weakness 09/15/2023   Past Surgical History:  Procedure Laterality Date   BRAIN BIOPSY  11/14/1995   CSF SHUNT  11/14/1995   PORTACATH PLACEMENT     portacath removal     RADIOLOGY WITH ANESTHESIA N/A 08/30/2021   Procedure: MRI WITH ANESTHESIA BRAIN WITH AND WITHOUT CONTRAST;  Surgeon: Radiologist, Medication, MD;  Location: [facility] OR;  Service: Radiology;  Laterality: N/A;   RADIOLOGY WITH ANESTHESIA N/A 05/25/2022   Procedure: MRI WITH ANESTHESIA OF BRAIN WITH AND WITHOUT CONTRAST;  Surgeon: Radiologist, Medication, MD;  Location: [facility] OR;  Service: Radiology;  Laterality: N/A;  RADIOLOGY WITH ANESTHESIA N/A 10/05/2024   Procedure: MRI WITH ANESTHESIA;  Surgeon: Radiologist, Medication, MD;  Location: [facility] OR;  Service: Radiology;  Laterality: N/A;   Family History  Problem Relation Age of Onset   Hypertension Mother    Hypertension Father    Hyperlipidemia Father    Prostate cancer Father    Diabetes type I Sister        Died of complications of diabetes in her 33's   Sleep apnea Maternal Aunt    Social  History:  reports that he has never smoked. He has never been exposed to tobacco smoke. He has never used smokeless tobacco. He reports that he does not drink alcohol and does not use drugs. Allergies: No Known Allergies Medications Prior to Admission  Medication Sig Dispense Refill   Apoaequorin (PREVAGEN) 10 MG CAPS Take 10 mg by mouth daily.     ascorbic acid (VITAMIN C) 1000 MG tablet Take 2,000 mg by mouth daily.     aspirin  81 MG chewable tablet CHEW AND SWALLOW 1 TABLET(81 MG) BY MOUTH DAILY 90 tablet 3   Cyanocobalamin (B-12 PO) Take 1 tablet by mouth daily.     cyclobenzaprine  (FLEXERIL ) 5 MG tablet Take 1 tablet (5 mg total) by mouth at bedtime as needed for muscle spasms. 30 tablet 1   desmopressin  (DDAVP  NASAL) 0.01 % solution USE ONE SPRAY IN EACH NOSTRIL FOUR TIMES DAILY 70 mL 3   desmopressin  (DDAVP ) 0.2 MG tablet Take 0.2 mg by mouth in the morning, at noon, and at bedtime.     fluticasone  (FLONASE ) 50 MCG/ACT nasal spray Place 1 spray into both nostrils daily. 16 g 0   hydrocortisone  (CORTEF ) 5 MG tablet Take 5 mg by mouth daily.     levothyroxine  (SYNTHROID ) 100 MCG tablet Take 100 mcg by mouth daily before breakfast.     LORazepam  (ATIVAN ) 2 MG tablet TAKE 3 TABLETS(6 MG) BY MOUTH AT BEDTIME 90 tablet 0   losartan  (COZAAR ) 25 MG tablet TAKE 1 TABLET(25 MG) BY MOUTH DAILY 90 tablet 3   Multiple Vitamin (MULTI VITAMIN) TABS Take 1 tablet by mouth daily.     potassium chloride  (KLOR-CON ) 10 MEQ tablet Take 2 tablets (20 mEq total) by mouth daily. (Patient taking differently: Take 10 mEq by mouth 2 (two) times daily.) 180 tablet 3   rosuvastatin  (CRESTOR ) 20 MG tablet TAKE 1 TABLET(20 MG) BY MOUTH DAILY 90 tablet 3   Testosterone  (ANDROGEL  PUMP) 20.25 MG/ACT (1.62%) GEL Place 20.25 mg onto the skin daily. Apply 6 pumps 3 pumps per side) topically to shoulders or upper back once a day 75 g 3   melatonin 3 MG TABS tablet Take 1 tablet (3 mg total) by mouth at bedtime. (Patient not  taking: Reported on 10/03/2024) 90 tablet 3    Home: Home Living Family/patient expects to be discharged to:: Private residence Living Arrangements: Parent Available Help at Discharge: Available 24 hours/day Type of Home: House Home Access: Stairs to enter Secretary/administrator of Steps: 2 Entrance Stairs-Rails: None Home Layout: Two level, Bed/bath upstairs Alternate Level Stairs-Number of Steps: 14 Alternate Level Stairs-Rails: Right Bathroom Shower/Tub: Engineer, Manufacturing Systems: Handicapped height Home Equipment: Agricultural Consultant (2 wheels), [employer] - single point  Functional History: Prior Function Prior Level of Function : Needs assist Mobility Comments: typically walks without assist; can ambulate in community ADLs Comments: Father helps with his bath, able to dress, mother checks behind him, loves to play games (handheld system) loves bowling and [facility] [employer] Functional Status:  Mobility: Bed Mobility Overal bed mobility: Needs Assistance Bed Mobility: Supine to Sit Supine to sit: Mod assist Sit to supine: Min assist, Used rails, HOB elevated General bed mobility comments: mod assist to clear legs and elevate trunk from surface with bed flat. Pt with posterior bias with continued mod assist for balance EOB Transfers Overall transfer level: Needs assistance Equipment used: Rolling walker (2 wheels) Transfers: Sit to/from Stand Sit to Stand: Mod assist General transfer comment: mod assist to rise from elevated surface and repeated 5 trials from chair with progression to min assist. Pt needs multimodal cues and assist for anterior translation to rise to standing Ambulation/Gait Ambulation/Gait assistance: Mod assist, +2 safety/equipment Gait Distance (Feet): 150 Feet Assistive device: Rolling walker (2 wheels), 1 person hand held assist Gait Pattern/deviations: Step-to pattern, Decreased stride length, Ataxic, Shuffle, Decreased dorsiflexion - left, Narrow base of  support General Gait Details: pt with Lt AFO donned in sitting and initiated gait with RW with pt grasping LUE with thumb and forefinger but demonstrated decreased control and safety with RW after 30' transitioned to RUE hand held assist. Pt toe walking on LLE with rushed gait needing mod assist for balance to prevent fall and max cues for safety and direction. Mom confirms pt normally toe walks but not as severely and was not rushing with gait PTA Gait velocity: decreased Gait velocity interpretation: <1.8 ft/sec, indicate of risk for recurrent falls    ADL: ADL Overall ADL's : Needs assistance/impaired Eating/Feeding: Moderate assistance, Bed level Eating/Feeding Details (indicate cue type and reason): father feeding, but can usually complete himself Grooming: Minimal assistance, Sitting Upper Body Bathing: Minimal assistance, Sitting Lower Body Bathing: Maximal assistance, Sitting/lateral leans, Sit to/from stand Upper Body Dressing : Minimal assistance, Sitting Lower Body Dressing: Maximal assistance, Sit to/from stand, Sitting/lateral leans Toilet Transfer: Moderate assistance, Stand-pivot, BSC/3in1, Rolling walker (2 wheels) Toilet Transfer Details (indicate cue type and reason): simulated from bed Toileting- Clothing Manipulation and Hygiene: Moderate assistance, Sitting/lateral lean, Sit to/from stand Functional mobility during ADLs: Moderate assistance, +2 for physical assistance, +2 for safety/equipment, Cueing for safety, Cueing for sequencing, Rolling walker (2 wheels) General ADL Comments: Prior to this admission, patient living with his parents, able to ambulate without device, and able to complete dressing on his own. His father would assist with bathing for thoroughness. Currently, patient with need for mod A for ADL management, and mod A from an elevated surface to achieve standing. Patient with significant posterior lean in standing, therefore unable to attempt ambulation with  only one person as support. Patient able to scoot to [facility] at supervision level, however requiring increased time and repetitions in order to be able to complete. Given prior level, and patient's 24/7 support, OT recommending intensive rehab >3 hours prior to discharge home. OT will continue to follow.  Cognition: Cognition Orientation Level: Oriented X4 Cognition Arousal: Alert Behavior During Therapy: Flat affect  Blood pressure 117/79, pulse 78, temperature (!) 97.5 F (36.4 C), temperature source Oral, resp. rate 18, height 5' 8 (1.727 m), weight 89.9 kg, SpO2 100%. Physical Exam Vitals and nursing note reviewed. Exam conducted with a chaperone present.  Constitutional:      Appearance: He is obese.     Comments: Pt supine in bed; parents at bedside; kept closing his eyes during exam; answers everything with  ok and fine and denies pain, although obviously having spasms/pain, NAD  HENT:     Head: Normocephalic.  Comments:  Mouth open- unable to smile for me- and tingle appears to be on L     Right Ear: External ear normal.     Left Ear: External ear normal.     Nose: Nose normal. No congestion.     Mouth/Throat:     Mouth: Mucous membranes are dry.     Pharynx: Oropharynx is clear.  Eyes:     General:        Right eye: No discharge.        Left eye: No discharge.     Comments: Cannot test- pt kept closing his eyes- unless tickled intermittently- only during parts of exam  Cardiovascular:     Rate and Rhythm: Normal rate and regular rhythm.     Heart sounds: Normal heart sounds. No murmur heard.    No gallop.  Pulmonary:     Effort: Pulmonary effort is normal. No respiratory distress.     Breath sounds: Normal breath sounds. No wheezing, rhonchi or rales.  Abdominal:     General: Bowel sounds are normal. There is no distension.     Palpations: Abdomen is soft.     Tenderness: There is no abdominal tenderness.  Musculoskeletal:     Cervical back: Neck supple.      Comments: RUE-  biceps/triceps 4/5; WE 4/5; Grip 4/5 and FA 2+/5 LUE- posturing constantly with LUE Biceps 5-/5; triceps 5-/5, WE 2-/5 but somewhat contracted; Grip 4-/5 and FA 2+/5 L wrist flexion contracture, but has more movement that expected Almost appears L elbow contracture, but then with encouragement got only lacking 20 degrees of Elbow extension RLE- 4+/5 except PF 4/5 LLE- HF 3+ to 4-/5; KE 4-/5 KF 3-/5 DF 2-/5 and PF 2-/5 Wears a fully encasing calf L AFO- not hinged   Skin:    General: Skin is warm and dry.     Comments: R forearm IV- looks OK  Neurological:     Mental Status: He is alert.     Comments: Pt kept repeating fine and OK-  Denies pain, even though had intermittent sweat on forehead when having a spasm/spasticity Toes contracted on LLE- with toe curling- can wiggle somewhat Hoffman's RUE and Clonus 3 beats on RLE- couldn't test on LUE/LLE due to tone/posturing MAS of 2 in LUE I think, but was not relaxing for testing (posturing as well) MAS of 2 in L hip/knee and ankle- again was fighting me so not sure No significant spasticity in RUE/RLE except hoffman's and clonus New dysarthria- very difficult to understand except 1 word answers are somewhat better  Psychiatric:     Comments: Flat, quiet     Results for orders placed or performed during the hospital encounter of 10/03/24 (from the past 24 hours)  Comprehensive metabolic panel     Status: None   Collection Time: 10/07/24  9:29 AM  Result Value Ref Range   Sodium 138 135 - 145 mmol/L   Potassium 4.9 3.5 - 5.1 mmol/L   Chloride 106 98 - 111 mmol/L   CO2 22 22 - 32 mmol/L   Glucose, Bld 78 70 - 99 mg/dL   BUN 12 6 - 20 mg/dL   Creatinine, Ser 8.91 0.61 - 1.24 mg/dL   Calcium  9.3 8.9 - 10.3 mg/dL   Total Protein 7.5 6.5 - 8.1 g/dL   Albumin 4.0 3.5 - 5.0 g/dL   AST 32 15 - 41 U/L   ALT 31 0 - 44 U/L   Alkaline Phosphatase 56  38 - 126 U/L   Total Bilirubin 0.5 0.0 - 1.2 mg/dL   GFR, Estimated >39 >39  mL/min   Anion gap 10 5 - 15   ECHOCARDIOGRAM COMPLETE Result Date: 10/06/2024    ECHOCARDIOGRAM REPORT   Patient Name:   BRIER REID Date of Exam: 10/06/2024 Medical Rec #:  [id number]        Height:       68.0 in Accession #:    [id number]       Weight:       198.2 lb Date of Birth:  1981/01/27        BSA:          2.036 m Patient Age:    43 years         BP:           120/76 mmHg Patient Gender: M                HR:           86 bpm. Exam Location:  Inpatient Procedure: 2D Echo (Both Spectral and Color Flow Doppler were utilized during            procedure). Indications:    stroke  History:        Patient has prior history of Echocardiogram examinations, most                 recent 05/30/2022. Risk Factors:Hypertension, Dyslipidemia and                 Sleep Apnea.  Sonographer:    Tinnie Barefoot RDCS Sonographer#2:  Sherlean Dubin Referring Phys: [id number] ARY CUMMINS  Sonographer Comments: Technically difficult study due to poor echo windows. Image acquisition challenging due to respiratory motion and Image acquisition challenging due to uncooperative patient. IMPRESSIONS  1. Left ventricular ejection fraction, by estimation, is 60 to 65%. The left ventricle has normal function. The left ventricle has no regional wall motion abnormalities. Left ventricular diastolic parameters were normal.  2. Right ventricular systolic function is normal. The right ventricular size is normal.  3. The mitral valve is normal in structure. No evidence of mitral valve regurgitation. No evidence of mitral stenosis.  4. The aortic valve is normal in structure. Aortic valve regurgitation is not visualized. No aortic stenosis is present.  5. The inferior vena cava is normal in size with greater than 50% respiratory variability, suggesting right atrial pressure of 3 mmHg. FINDINGS  Left Ventricle: Left ventricular ejection fraction, by estimation, is 60 to 65%. The left ventricle has normal function. The left ventricle has no  regional wall motion abnormalities. The left ventricular internal cavity size was normal in size. There is  no left ventricular hypertrophy. Left ventricular diastolic parameters were normal. Right Ventricle: The right ventricular size is normal. No increase in right ventricular wall thickness. Right ventricular systolic function is normal. Left Atrium: Left atrial size was normal in size. Right Atrium: Right atrial size was normal in size. Pericardium: There is no evidence of pericardial effusion. Mitral Valve: The mitral valve is normal in structure. No evidence of mitral valve regurgitation. No evidence of mitral valve stenosis. Tricuspid Valve: The tricuspid valve is normal in structure. Tricuspid valve regurgitation is not demonstrated. No evidence of tricuspid stenosis. Aortic Valve: The aortic valve is normal in structure. Aortic valve regurgitation is not visualized. No aortic stenosis is present. Pulmonic Valve: The pulmonic valve was normal in structure. Pulmonic valve regurgitation is not visualized. No  evidence of pulmonic stenosis. Aorta: The aortic root is normal in size and structure. Venous: The inferior vena cava is normal in size with greater than 50% respiratory variability, suggesting right atrial pressure of 3 mmHg. IAS/Shunts: The interatrial septum was not well visualized.  LEFT VENTRICLE PLAX 2D LVOT diam:     2.20 cm   Diastology LV SV:         76        LV e' medial:    6.85 cm/s LV SV Index:   37        LV E/e' medial:  11.6 LVOT Area:     3.80 cm  LV e' lateral:   10.70 cm/s LV IVRT:       99 msec   LV E/e' lateral: 7.4  RIGHT VENTRICLE RV Basal diam:  2.50 cm RV S prime:     12.60 cm/s TAPSE (M-mode): 1.5 cm LEFT ATRIUM             Index        RIGHT ATRIUM           Index LA Vol (A2C):   36.0 ml 17.68 ml/m  RA Area:     10.80 cm LA Vol (A4C):   25.6 ml 12.57 ml/m  RA Volume:   24.30 ml  11.94 ml/m LA Biplane Vol: 30.7 ml 15.08 ml/m  AORTIC VALVE LVOT Vmax:   116.00 cm/s LVOT  Vmean:  77.100 cm/s LVOT VTI:    0.200 m  AORTA Ao Root diam: 2.90 cm MITRAL VALVE MV Area (PHT): 3.51 cm    SHUNTS MV Decel Time: 216 msec    Systemic VTI:  0.20 m MV E velocity: 79.30 cm/s  Systemic Diam: 2.20 cm MV A velocity: 95.20 cm/s MV E/A ratio:  0.83 Morene Brownie Electronically signed by Morene Brownie Signature Date/Time: 10/06/2024/1:06:25 PM    Final    CT ANGIO HEAD NECK W WO CM Result Date: 10/06/2024 EXAM: CT HEAD WITHOUT CTA HEAD AND NECK WITH AND WITHOUT 10/06/2024 01:26:36 AM TECHNIQUE: CTA of the head and neck was performed with and without the administration of 75 mL of iohexol  (OMNIPAQUE ) 350 MG/ML injection. Noncontrast CT of the head with reconstructed 2-D images are also provided for review. Multiplanar 2D and/or 3D reformatted images are provided for review. Automated exposure control, iterative reconstruction, and/or weight based adjustment of the mA/kV was utilized to reduce the radiation dose to as low as reasonably achievable. COMPARISON: Head CT 10/03/2024 and brain MRI 10/05/2024. CLINICAL HISTORY: Stroke/TIA, determine embolic source. FINDINGS: CT HEAD: BRAIN AND VENTRICLES: Extensive mineralization within the thalami, left basal ganglia, and both cerebellar hemispheres consistent with Fahr disease. No acute intracranial hemorrhage. No mass effect. No midline shift. No extra-axial fluid collection. No evidence of acute infarct. No hydrocephalus. ORBITS: No acute abnormality. SINUSES AND MASTOIDS: No acute abnormality. CTA NECK: AORTIC ARCH AND ARCH VESSELS: No dissection or arterial injury. No significant stenosis of the brachiocephalic or subclavian arteries. CERVICAL CAROTID ARTERIES: No dissection, arterial injury, or hemodynamically significant stenosis by NASCET criteria. CERVICAL VERTEBRAL ARTERIES: No dissection, arterial injury, or significant stenosis. LUNGS AND MEDIASTINUM: Unremarkable. SOFT TISSUES: No acute abnormality. BONES: No acute abnormality. CTA HEAD:  ANTERIOR CIRCULATION: No significant stenosis of the internal carotid arteries. No significant stenosis of the anterior cerebral arteries. No significant stenosis of the middle cerebral arteries. No aneurysm. POSTERIOR CIRCULATION: No significant stenosis of the posterior cerebral arteries. No significant stenosis of the basilar artery. No significant stenosis  of the vertebral arteries. No aneurysm. OTHER: No dural venous sinus thrombosis on this non-dedicated study. IMPRESSION: 1. No large vessel occlusion in the head or neck. 2. Extensive mineralization within the thalami, left basal ganglia, and both cerebellar hemispheres consistent with Fahr disease. Electronically signed by: Franky Stanford MD 10/06/2024 01:59 AM EST RP Workstation: [id number]     Assessment/Plan: Diagnosis: L Basal ganglia stroke with R hemiparesis and exacerbation of his ADLs/mobility after prior brain cancer in teens/spasticity Does the need for close, 24 hr/day medical supervision in concert with the patient's rehab needs make it unreasonable for this patient to be served in a less intensive setting? Yes Co-Morbidities requiring supervision/potential complications: brain CA with  associated spasticity- hasn't been treated in past; pan hypopituitarism; low Na chronic; HTN; seizure d/c; cognitive disability since brain surgery, new dysarthria- hard ot understand Due to bladder management, bowel management, safety, disease management, medication administration, pain management, and patient education, does the patient require 24 hr/day rehab nursing? Yes Does the patient require coordinated care of a physician, rehab nurse, therapy disciplines of PT, OT and SLP to address physical and functional deficits in the context of the above medical diagnosis(es)? Yes Addressing deficits in the following areas: balance, endurance, locomotion, strength, transferring, bowel/bladder control, bathing, dressing, feeding, grooming, toileting, speech,  and language Can the patient actively participate in an intensive therapy program of at least 3 hrs of therapy per day at least 5 days per week? Yes The potential for patient to make measurable gains while on inpatient rehab is good Anticipated functional outcomes upon discharge from inpatient rehab are modified independent and supervision  with PT, modified independent and supervision with OT, supervision with SLP mainly dysarthria- which is new. Estimated rehab length of stay to reach the above functional goals is: 2- 2.5 weeks Anticipated discharge destination: Home Overall Rehab/Functional Prognosis: good  RECOMMENDATIONS: This patient's condition is appropriate for continued rehabilitative care in the following setting: CIR Patient has agreed to participate in recommended program. Yes Note that insurance prior authorization may be required for reimbursement for recommended care.  Comment:  Pt has new spasticity/exacerbated since new stroke- it sounds like had mildly on L side, but now having new stroke has caused it to activate, already developing Sx's on R side as well- Rehab is uniquely qualified to manage this because greatly impacting his ability to walk and complete ADLs- needed in acute rehab, since would not be able to care for himself/ADLs/mobility until it's improved. Pt MIGHT benefit from PO meds (not baclofen), but could make him more sedated- will likely need Botulinum toxin Pt's parents are elderly and need him to be at a higher level of function.  Pt's labile hyponatremia from 120s-130s show he needs more oversight due to change DDVAP dosing.    Pt is mod A of 2 walking 150 ft, so this is not able to do in SNF setting- he needs to focus on quality of gait right now and to treat his worsening toe walking.   TO assist with pt's behavior at night- on Ativan , but probably will need titration in setting of new stroke.  Will d/w admissions coordinator to help pt get admitted to  rehab Thank you for this consult   Duwaine Barrs, MD 10/07/2024    I spent a total of  71  minutes on total care today- >50% coordination of care- due to  D/w pt's parents and review of chart- multiple issues addressed esp his spasticity and education of parents about  spasticity, what it is, how to treat, etc. And documentation took 24 minutes

## 2024-10-07 NOTE — Evaluation (Signed)
 Clinical/Bedside Swallow Evaluation Patient Details  Name: Nicholas Holt MRN: [id number] Date of Birth: [birth year]  Today's Date: 10/07/2024 Time: SLP Start Time (ACUTE ONLY): 9166 SLP Stop Time (ACUTE ONLY): 0844 SLP Time Calculation (min) (ACUTE ONLY): 11 min  Past Medical History:  Past Medical History:  Diagnosis Date   Anxiety    Attention deficit disorder 08/08/2006   Bilateral leg cramps 11/25/2006   Blood transfusion without reported diagnosis 1997   Chemotherapy associated   Brain cancer (HCC) 1998   Cough 12/08/2015   COVID-19 10/16/2017   Elevated hemidiaphragm 07/09/2015   Left, presumably after portacath placement.    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  Complicated by left sided motor tic disorder and panhypopituitarism.    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  Includes central diabetes insipidus, secondary hypothyroidism, secondary adrenal insufficiency, and secondary hypogonadism.    Perennial allergic rhinitis 07/09/2015   Pre-diabetes    no meds   Preventative health care 07/09/2015   Seizure disorder (HCC) 08/08/2006   Predated brain tumor.  Generalized seizure ocurred after running out of lorazepam  post-tumor.    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 Treated with alendronate  from 2009-2016. DEXA (03/16/2008): L Spine T -2.0. L fem neck T -0.9, R fem neck T -1.9.  DEXA (10/15/2014): L Spine T -1.3, L fem neck T -0.6, R fem  neck T -1.8.  Bisphosphonate holiday 06/2015.  Repeat DEXA scan in 10/2016 to reassess.    Weakness 09/15/2023   Past Surgical History:  Past Surgical History:  Procedure Laterality Date   BRAIN BIOPSY  11/14/1995   CSF SHUNT  11/14/1995   PORTACATH PLACEMENT     portacath removal     RADIOLOGY WITH ANESTHESIA N/A 08/30/2021   Procedure: MRI WITH ANESTHESIA BRAIN WITH AND WITHOUT CONTRAST;  Surgeon: Radiologist, Medication, MD;  Location: [facility] OR;  Service: Radiology;  Laterality: N/A;   RADIOLOGY WITH ANESTHESIA N/A 05/25/2022   Procedure: MRI WITH ANESTHESIA OF BRAIN WITH AND WITHOUT CONTRAST;  Surgeon: Radiologist, Medication, MD;  Location: [facility] OR;  Service: Radiology;  Laterality: N/A;   RADIOLOGY WITH ANESTHESIA N/A 10/05/2024   Procedure: MRI WITH ANESTHESIA;  Surgeon: Radiologist, Medication, MD;  Location: [facility] OR;  Service: Radiology;  Laterality: N/A;   HPI:  Patient is 43 yo male presenting with acutely worsening balance and ambulation on 10/04/24. 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 CT Chronic encephalomalacia or lacunar infarcts in the inferior left basal  ganglia. 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 radiation therapy complicated by left-sided motor tic disorder and panhypopituitarism, hypertension, hyperlipidemia, anxiety, ADD, chronic ataxia    Assessment / Plan / Recommendation  Clinical Impression  Pt's parents deny pt coughing, pocketing with meals but occasional hiccups and state he eats at a fast rate. Mild left facial decreased ROM, intact dentition and weak volitional cough. There were no indications of decreased airway protection with continuous sips thin via straw. Masticated cracker timely  without oral residue. Needed cues for decreased bite size. Recommend continue regular, thin, pills wtih thin, slow rate and monitor for potential pocketing. Educated parents re:  recommendations who verbalized understanding. No follow up recommded for swallow. Confirmed with MD and wrote order for speech-language-cognitive assessment (history of dysarthria and cognitive impairments- parents have noted decreased however uncertain how different from baseline). SLP Visit Diagnosis: Dysphagia, unspecified (R13.10)    Aspiration Risk  Mild aspiration risk    Diet Recommendation Regular;Thin liquid    Liquid Administration via: Cup;Straw Medication Administration: Whole meds with liquid Supervision: Patient able to self feed (as able) Compensations: Minimize environmental distractions;Slow rate;Small sips/bites (monitor for pocketing) Postural Changes: Seated upright at 90 degrees    Other  Recommendations Oral Care Recommendations: Oral care BID     Assistance Recommended at Discharge    Functional Status Assessment Patient has not had a recent decline in their functional status  Frequency and Duration            Prognosis        Swallow Study   General HPI: Patient is 43 yo male presenting with acutely worsening balance and ambulation on 10/04/24. 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 CT Chronic encephalomalacia or lacunar infarcts in the inferior left basal  ganglia. 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 radiation therapy complicated by left-sided motor tic disorder and panhypopituitarism, hypertension, hyperlipidemia, anxiety, ADD, chronic ataxia Type of Study: Bedside Swallow Evaluation Previous Swallow Assessment:  (none) Diet Prior to this Study: Regular;Thin liquids (Level 0) Temperature Spikes Noted: No Respiratory Status: Room air History of Recent Intubation: No Behavior/Cognition: Alert;Cooperative;Pleasant mood;Requires cueing Oral Cavity Assessment: Within Functional Limits Oral Care Completed by SLP: No Oral Cavity - Dentition: Adequate natural  dentition Vision: Functional for self-feeding Self-Feeding Abilities: Able to feed self;Needs assist (assist due to left weakness but encourage self feeding) Patient Positioning: Upright in chair Baseline Vocal Quality: Low vocal intensity Volitional Cough: Weak Volitional Swallow: Able to elicit    Oral/Motor/Sensory Function Overall Oral Motor/Sensory Function: Mild impairment Facial ROM: Reduced left;Suspected CN VII (facial) dysfunction Facial Symmetry: Abnormal symmetry left;Suspected CN VII (facial) dysfunction Lingual ROM: Other (Comment) (imprecise) Lingual Symmetry: Within Functional Limits (initially appeared deviated to left but functional on following attempt)   Ice Chips Ice chips: Not tested   Thin Liquid Thin Liquid: Within functional limits Presentation: Cup;Straw    Nectar Thick Nectar Thick Liquid: Not tested   Honey Thick Honey Thick Liquid: Not tested   Puree Puree: Within functional limits   Solid     Solid: Within functional limits      Dustin Olam Bull 10/07/2024,9:13 AM

## 2024-10-08 DIAGNOSIS — F419 Anxiety disorder, unspecified: Secondary | ICD-10-CM

## 2024-10-08 DIAGNOSIS — I63532 Cerebral infarction due to unspecified occlusion or stenosis of left posterior cerebral artery: Secondary | ICD-10-CM

## 2024-10-08 DIAGNOSIS — I10 Essential (primary) hypertension: Secondary | ICD-10-CM | POA: Diagnosis not present

## 2024-10-08 DIAGNOSIS — E039 Hypothyroidism, unspecified: Secondary | ICD-10-CM

## 2024-10-08 LAB — COMPREHENSIVE METABOLIC PANEL WITH GFR
ALT: 36 U/L (ref 0–44)
AST: 32 U/L (ref 15–41)
Albumin: 3.8 g/dL (ref 3.5–5.0)
Alkaline Phosphatase: 58 U/L (ref 38–126)
Anion gap: 10 (ref 5–15)
BUN: 13 mg/dL (ref 6–20)
CO2: 22 mmol/L (ref 22–32)
Calcium: 9.5 mg/dL (ref 8.9–10.3)
Chloride: 114 mmol/L — ABNORMAL HIGH (ref 98–111)
Creatinine, Ser: 1.31 mg/dL — ABNORMAL HIGH (ref 0.61–1.24)
GFR, Estimated: >60 mL/min (ref >60)
Glucose, Bld: 107 mg/dL — ABNORMAL HIGH (ref 70–99)
Potassium: 4.7 mmol/L (ref 3.5–5.1)
Sodium: 146 mmol/L — ABNORMAL HIGH (ref 135–145)
Total Bilirubin: 0.3 mg/dL (ref 0.0–1.2)
Total Protein: 7.3 g/dL (ref 6.5–8.1)

## 2024-10-08 LAB — BASIC METABOLIC PANEL WITH GFR
Anion gap: 10 (ref 5–15)
BUN: 14 mg/dL (ref 6–20)
CO2: 22 mmol/L (ref 22–32)
Calcium: 9.6 mg/dL (ref 8.9–10.3)
Chloride: 112 mmol/L — ABNORMAL HIGH (ref 98–111)
Creatinine, Ser: 1.19 mg/dL (ref 0.61–1.24)
GFR, Estimated: >60 mL/min (ref >60)
Glucose, Bld: 121 mg/dL — ABNORMAL HIGH (ref 70–99)
Potassium: 4.5 mmol/L (ref 3.5–5.1)
Sodium: 144 mmol/L (ref 135–145)

## 2024-10-08 NOTE — Progress Notes (Signed)
 Physical Therapy Treatment Patient Details Name: Nicholas Holt MRN: [id number] DOB: February 16, 1981 Today's Date: 10/08/2024   History of Present Illness 43 yo M adm 10/04/24 with impaired balance with posterior limb internal capsule infarct. PMH: suprasellar germinoma s/p chemo with left-sided motor tic disorder, HTN, HLD anxiety, ADD, ataxia, seizure disorder, OSA    PT Comments  Pt resting in bed with parents present at bedside, pt agreeable to mobility. He continues to require MOD A for bed mobility due to strength and balance deficits below his baseline. Increased time spent seated at EOB for static and progressing to dynamic sitting balance tasks as tolerated. Heavy posterior and right lateral lean noted, with improved awareness to sitting balance deficits with practice and patient able to self correct intermittently. He benefits from multimodal cues for postural corrections and to use UE and LE as tolerated to aid in balance. Continued posterior lean with increased left lateral lean noted in standing with inability to correct. Pt eager to walk demonstrating limited insight to his deficits and safety as his poor standing balance would not be safe to attempt ambulation during session. Pt demonstrates deficits below his baseline functional status of independent; see problem list below for additional details. He is eager to improve and participate in therapy and continues to be a great candidate for inpatient rehab. He will continue to benefit from skilled therapy interventions while admitted to acute rehab. PT discharge recommendations remain unchanged.    If plan is discharge home, recommend the following: Two people to help with walking and/or transfers;A lot of help with bathing/dressing/bathroom;Assistance with cooking/housework;Help with stairs or ramp for entrance   Can travel by private vehicle        Equipment Recommendations  Other (comment) (TBD with progression; may defer to next level  of care)    Recommendations for Other Services Rehab consult     Precautions / Restrictions Precautions Precautions: Fall Recall of Precautions/Restrictions: Impaired Precaution/Restrictions Comments: Left AFO Restrictions Weight Bearing Restrictions Per Provider Order: No     Mobility  Bed Mobility Overal bed mobility: Needs Assistance Bed Mobility: Supine to Sit, Sit to Supine     Supine to sit: Mod assist, HOB elevated (increased time and effort) Sit to supine: Mod assist, Used rails, HOB elevated   General bed mobility comments: MOD A with continual multimodal cues for sit<>supine with heavy posterior lean, occasional right lateral lean requiring at least min A to maintain seated balance; additional cues for keep feet supported on floor and rest elbows on knees for balance assist    Transfers Overall transfer level: Needs assistance Equipment used: 1 person hand held assist, 2 person hand held assist Transfers: Sit to/from Stand Sit to Stand: Mod assist           General transfer comment: mod assist sit<>stand x3 during session. HHA bilaterally, verbal cues for base of support    Ambulation/Gait               General Gait Details: gait deferred for today due to significant dynamic sitting and static standing balance deficits   Stairs             Wheelchair Mobility     Tilt Bed    Modified Rankin (Stroke Patients Only)       Balance Overall balance assessment: Needs assistance Sitting-balance support: Bilateral upper extremity supported, Feet supported Sitting balance-Leahy Scale: Poor Sitting balance - Comments: at least min assist for sitting balance however difficulty maintaining static sitting balance >  10 sec with heavy posterrior lean. With practice able to self correct 50% of the time. Intermittent right lateral lean in sitting as well. Postural control: Posterior lean, Right lateral lean Standing balance support: Bilateral upper  extremity supported Standing balance-Leahy Scale: Poor Standing balance comment: continued heavy posterior lean in standing, unable to self correct requiring max to total assist to maintain static standing balance; with heavy physical cues he could improve posture and anterior weight shift. lateral weight shifts with music completed, however continued to require at least mod assist for balance.                            Communication Communication Communication: Impaired Factors Affecting Communication: Reduced clarity of speech  Cognition Arousal: Alert Behavior During Therapy: Flat affect (improved mood with music)   PT - Cognitive impairments: Difficult to assess, Safety/Judgement, Problem solving, History of cognitive impairments                       PT - Cognition Comments: pt with baseline deficits with increased expressive deficits and decreased safety awareness per family Following commands: Impaired Following commands impaired: Follows one step commands with increased time    Cueing Cueing Techniques: Verbal cues, Gestural cues, Tactile cues  Exercises      General Comments        Pertinent Vitals/Pain Pain Assessment Pain Assessment: No/denies pain    Home Living                          Prior Function            PT Goals (current goals can now be found in the care plan section) Acute Rehab PT Goals Patient Stated Goal: Family interested in post acute rehab prior to home PT Goal Formulation: With patient/family Time For Goal Achievement: 10/20/24 Potential to Achieve Goals: Good Progress towards PT goals: Progressing toward goals    Frequency    Min 3X/week      PT Plan      Co-evaluation              AM-PAC PT 6 Clicks Mobility   Outcome Measure  Help needed turning from your back to your side while in a flat bed without using bedrails?: A Little Help needed moving from lying on your back to sitting on  the side of a flat bed without using bedrails?: A Lot Help needed moving to and from a bed to a chair (including a wheelchair)?: A Lot Help needed standing up from a chair using your arms (e.g., wheelchair or bedside chair)?: A Lot Help needed to walk in hospital room?: Total Help needed climbing 3-5 steps with a railing? : Total 6 Click Score: 11    End of Session Equipment Utilized During Treatment: Gait belt Activity Tolerance: Patient tolerated treatment well Patient left: in bed;with bed alarm set;with family/visitor present   PT Visit Diagnosis: Other abnormalities of gait and mobility (R26.89);Muscle weakness (generalized) (M62.81);Other symptoms and signs involving the nervous system (R29.898);Difficulty in walking, not elsewhere classified (R26.2)     Time: 1451-1530 PT Time Calculation (min) (ACUTE ONLY): 39 min  Charges:    $Therapeutic Activity: 23-37 mins $Neuromuscular Re-education: 8-22 mins                       Isaiah DEL. Kayla Deshaies, PT, DPT  [employer]  10/08/2024, 5:07 PM

## 2024-10-08 NOTE — Evaluation (Signed)
 Speech Language Pathology Evaluation Patient Details Name: Nicholas Holt MRN: [id number] DOB: [birth year] Today's Date: 10/08/2024 Time: 8997-8976 SLP Time Calculation (min) (ACUTE ONLY): 21 min  Problem List:  Patient Active Problem List   Diagnosis Date Noted   Gait abnormality 10/03/2024   Diarrhea 07/08/2024   Functional urinary incontinence 07/08/2024   Class 1 obesity with serious comorbidity and body mass index (BMI) of 30.0 to 30.9 in adult 04/23/2023   Prediabetes 10/10/2022   Elevated CK 10/10/2022   OSA (obstructive sleep apnea) 07/06/2022   Multiple lacunar infarcts (HCC) 05/04/2022   Hypertension 12/09/2019   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   Elevated hemidiaphragm 07/09/2015   Perennial allergic rhinitis 07/09/2015   H/O malignant neoplasm of brain 09/22/2014   Steroid-induced osteoporosis 03/27/2008   Bilateral leg cramps 11/25/2006   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  Complicated by left sided motor tic disorder and panhypopituitarism.    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  Includes central diabetes insipidus, secondary hypothyroidism, secondary adrenal insufficiency, and secondary hypogonadism.    Perennial allergic rhinitis 07/09/2015   Pre-diabetes    no meds   Preventative health care 07/09/2015   Seizure disorder (HCC) 08/08/2006   Predated brain tumor.  Generalized seizure ocurred after running out of lorazepam  post-tumor.    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 Treated with alendronate  from 2009-2016. DEXA (03/16/2008): L Spine T -2.0. L fem neck T -0.9, R fem neck T -1.9.  DEXA (10/15/2014): L Spine T -1.3, L fem neck T -0.6, R fem neck T -1.8.  Bisphosphonate holiday 06/2015.  Repeat DEXA scan in 10/2016 to reassess.    Weakness 09/15/2023   Past Surgical History:  Past Surgical History:  Procedure Laterality Date   BRAIN BIOPSY  11/14/1995   CSF SHUNT  11/14/1995   PORTACATH PLACEMENT     portacath removal     RADIOLOGY WITH ANESTHESIA N/A 08/30/2021   Procedure: MRI WITH ANESTHESIA BRAIN WITH AND WITHOUT CONTRAST;  Surgeon: Radiologist, Medication, MD;  Location: [facility] OR;  Service: Radiology;  Laterality: N/A;   RADIOLOGY WITH ANESTHESIA N/A 05/25/2022   Procedure: MRI WITH ANESTHESIA OF BRAIN WITH AND WITHOUT CONTRAST;  Surgeon: Radiologist, Medication, MD;  Location: [facility] OR;  Service: Radiology;  Laterality: N/A;   RADIOLOGY WITH ANESTHESIA N/A 10/05/2024   Procedure: MRI WITH ANESTHESIA;  Surgeon: Radiologist, Medication, MD;  Location: [facility] OR;  Service: Radiology;  Laterality: N/A;   HPI:  Patient is 43 yo male  presenting with acutely worsening balance and ambulation on 10/04/24. 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 CT Chronic encephalomalacia or lacunar infarcts in the inferior  left basal  ganglia. 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 Dad states speech intelligibility is decreased from baseline. Dad states cognition is worse however when asked to expand he refers to his speech. At baseline, pt can recall doctors' appointments independently, not temporaly oriented, and parents manage mediciations but he takes from pill box. During the day parents [place] to the [place] or bowling and he plays video games at home. He presents with significant, dysarthria at word and phrase level, decreased recall of 3 words and of diagnosis (0/3, correct with semantic and choice cue, able to state activitiy of this morning accurately, unable to recall diagnosis end of session). Receptive language intact and expression appears functional when listener able to decipher message. Accurate with verbal problem solving re: video game however unable to understand responses to additional questions. He wrote name with decreased intelligibility for last name. Although there are deficits in memory and possible problem solving will focus on pt's dysarthria in the acute setting. Recommend continued rehab in CIR to continue to address dysarthria deficits in cognition once speech intelligibility improves. Educated pt verbally and provided written strategies for speech.    SLP Assessment  SLP Recommendation/Assessment: Patient needs continued Speech Language Pathology Services SLP Visit Diagnosis: Cognitive communication deficit (R41.841);Dysarthria and anarthria (R47.1)     Assistance Recommended at Discharge  Frequent or constant Supervision/Assistance  Functional Status Assessment Patient  has had a recent decline in their functional status and demonstrates the ability to make significant improvements in function in a reasonable and predictable amount of time.  Frequency and Duration min 2x/week  2 weeks      SLP Evaluation Cognition  Overall Cognitive Status: History of cognitive impairments - at baseline (dad feels it's worse but when describing he refers more to his dysarthria that is worse) Arousal/Alertness: Awake/alert Orientation Level:  (stated rehab center for place, dad stated pt doesn't keep up with days of week, month, r) Attention: Sustained Sustained Attention: Appears intact (for verbal but suspect difficulties during tasks) Memory: Impaired Memory Impairment: Storage deficit;Retrieval deficit (0/3 words, 2 correct with semantic cues, 1 incorrect with choice, recalled ate breakfast and took meds, not recall stroke with delay) Awareness: Impaired Awareness Impairment: Intellectual impairment Problem Solving: Impaired Problem Solving Impairment: Verbal basic (most didn't understand due to dysarthria) Safety/Judgment: Impaired       Comprehension  Auditory Comprehension Overall Auditory Comprehension: Appears within functional limits for tasks assessed Visual Recognition/Discrimination Discrimination: Not tested Reading Comprehension Reading Status: Not tested    Expression Expression Primary Mode of Expression: Verbal Verbal Expression Overall Verbal Expression: Appears within functional limits for tasks assessed (difficult to understand due to dysarthria however feel expression is intact) Initiation: No impairment Level of Generative/Spontaneous Verbalization: Sentence Repetition:  (NT) Naming: Not tested Written Expression Dominant Hand: Right Written Expression:  (write name with decreased legibility and possible inaccurate letters for last name?)   Oral / Motor  Oral Motor/Sensory Function Overall Oral Motor/Sensory Function: Mild  impairment Facial ROM: Reduced left;Suspected CN VII (facial) dysfunction Facial Symmetry: Abnormal  symmetry left;Suspected CN VII (facial) dysfunction Lingual Symmetry: Within Functional Limits Motor Speech Overall Motor Speech: Impaired Respiration: Impaired Level of Impairment: Phrase Phonation: Low vocal intensity Resonance: Within functional limits Articulation: Impaired Level of Impairment: Word Intelligibility: Intelligibility reduced Word: 50-74% accurate Phrase: 25-49% accurate Sentence: 0-24% accurate Motor Planning: Within functional limits Motor Speech Errors: Not applicable            Dustin Olam Bull 10/08/2024, 10:59 AM

## 2024-10-08 NOTE — Plan of Care (Signed)
  Problem: Education: Goal: Knowledge of General Education information will improve Description: Including pain rating scale, medication(s)/side effects and non-pharmacologic comfort measures Outcome: Progressing   Problem: Education: Goal: Knowledge of General Education information will improve Description: Including pain rating scale, medication(s)/side effects and non-pharmacologic comfort measures Outcome: Progressing   Problem: Health Behavior/Discharge Planning: Goal: Ability to manage health-related needs will improve Outcome: Progressing   Problem: Clinical Measurements: Goal: Ability to maintain clinical measurements within normal limits will improve Outcome: Progressing Goal: Will remain free from infection Outcome: Progressing Goal: Diagnostic test results will improve Outcome: Progressing Goal: Respiratory complications will improve Outcome: Progressing Goal: Cardiovascular complication will be avoided Outcome: Progressing   Problem: Activity: Goal: Risk for activity intolerance will decrease Outcome: Progressing   Problem: Pain Managment: Goal: General experience of comfort will improve and/or be controlled Outcome: Progressing   Problem: Safety: Goal: Ability to remain free from injury will improve Outcome: Progressing

## 2024-10-08 NOTE — Plan of Care (Signed)

## 2024-10-08 NOTE — PMR Pre-admission (Signed)
 PMR Admission Coordinator Pre-Admission Assessment   Patient: Nicholas Holt is an 43 y.o., male MRN: [id number] DOB: 1981-10-02 Height: 5' 8 (1.727 m) Weight: 89.9 kg   Insurance Information HMO: yes    PPO:      PCP:      IPA:      80/20:      OTHER:  PRIMARY: [employer] Dual Complete      Policy#: [id number]      Subscriber: pt CM Name: Corean      Phone#: [phone] option 3     Fax#: [phone] Pre-Cert#: [id number]  auth for CIR from [place] with [facility] for [facility] medicare for admit 10/08/24 with next review date 10/14/24.  Updates due to general fax at fax listed above.        Employer:  Benefits:  Phone #: [phone]     Name:  Eff. Date: 02/12/24     Deduct: $257 (met)      Out of Pocket Max: [phone] ($736.14 met)      Life Max: n/a CIR: $1565/admit      SNF: 20 full days Outpatient: 80%     Co-Pay: 20% Home Health: 100%      Co-Pay:  DME: 80%     Co-Pay: 20% Providers:  SECONDARY: Medicaid of [place]      Policy#: [id number]       Phone#: [phone]   Financial Counselor:       Phone#:    The "Data Collection Information Summary" for patients in Inpatient Rehabilitation Facilities with attached "Privacy Act Statement-Health Care Records" was provided and verbally reviewed with: Patient and Family   Emergency Contact Information Contact Information       Name Relation Home Work Mobile    Selma A Father [phone]        Swamy,[employer] Mother [phone]             Other Contacts   None on File        Current Medical History  Patient Admitting Diagnosis: L PLIC CVA   History of Present Illness: Pt is a 43 y/o male with PMH of suprasellar germinoma s/p chemo with L sided motor tic disorder, HTN, anxiety, ADD, ataxia, seizures, 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  CTA head/neck with no LVO.  Neurology recommended DAPT x3 weeks, then plavix  alone.  Hospital course hypernatremia, may need to consult endocrinology for management of DDAVP  dosing.  Therapy ongoing and pt has been recommended for CIR.    Patient's medical record from [facility] has been reviewed by the rehabilitation admission coordinator and physician.  Past Medical History  Past Medical History:  Diagnosis Date   Anxiety    Attention deficit disorder 08/08/2006   Bilateral leg cramps 11/25/2006   Blood transfusion without reported diagnosis 1997   Chemotherapy associated   Brain cancer (HCC) 1998   Cough 12/08/2015   COVID-19 10/16/2017   Elevated hemidiaphragm 07/09/2015   Left, presumably after portacath placement.    Fall 04/13/2022   H/O malignant neoplasm of brain 09/22/2014   Suprasellar germinoma treated at [facility] in 1997; received chemotherapy (cisplatin, etoposide, vincristine, and cyclophosphomide) and radiation therapy.  Complicated by left sided motor tic disorder and panhypopituitarism.    Hyperlipidemia  08/08/2006   Hypertension    Hyponatremia 05/30/2022   Hypothyroidism    Imbalance    at risk for falls   Night sweats 02/06/2018   Obesity (BMI 30.0-34.9) 07/09/2015   OSA (obstructive sleep apnea) 07/06/2022   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) 08/08/2006   Following therapy for the suprasellar germinoma.  Includes central diabetes insipidus, secondary hypothyroidism, secondary adrenal insufficiency, and secondary hypogonadism.    Perennial allergic rhinitis 07/09/2015   Pre-diabetes    no meds   Preventative health care 07/09/2015   Seizure disorder (HCC) 08/08/2006   Predated brain tumor.  Generalized seizure ocurred after running out of lorazepam  post-tumor.    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 Treated with alendronate  from 2009-2016. DEXA (03/16/2008): L Spine T -2.0. L fem neck T -0.9, R fem neck T -1.9.  DEXA (10/15/2014): L Spine T -1.3, L fem neck T -0.6, R fem neck T -1.8.  Bisphosphonate holiday 06/2015.  Repeat DEXA scan in 10/2016 to reassess.    Weakness 09/15/2023    Has the patient had major surgery during 100 days prior to admission? No  Family History   family history includes Diabetes type I in his sister; Hyperlipidemia in his father; Hypertension in his father and mother; Prostate cancer in his father; Sleep apnea in his maternal aunt.  Current Medications  Current Facility-Administered Medications:     stroke: early stages of recovery book, , Does not apply, Once, Jerri Pfeiffer, MD   acetaminophen  (TYLENOL ) tablet 650 mg, 650 mg, Oral, Q6H PRN, 650 mg at 10/10/24 0904 **OR** acetaminophen  (TYLENOL ) suppository 650 mg, 650 mg, Rectal, Q6H PRN, Dibia, Landon BRAVO, MD   aspirin  chewable tablet 81 mg, 81 mg, Oral, Daily, Dibia, Pauline E, MD, 81 mg at 10/10/24 9096   bisacodyl  (DULCOLAX) EC tablet 5 mg, 5 mg, Oral, Daily PRN, Dibia, Pauline E, MD, 5 mg at 10/09/24 2102   clopidogrel  (PLAVIX ) tablet 75 mg, 75 mg, Oral, Daily, Dibia, Pauline E, MD, 75 mg at 10/10/24 9096   cyclobenzaprine  (FLEXERIL ) tablet 5 mg, 5 mg, Oral, QHS PRN, Dibia, Pauline E, MD, 5 mg at 10/05/24 2144   desmopressin  (DDAVP ) tablet 0.2 mg, 0.2 mg, Oral, TID, Pokhrel, Laxman, MD, 0.2 mg at 10/10/24 9061   heparin  injection 5,000 Units, 5,000 Units, Subcutaneous, Q8H, Dibia, Pauline E, MD, 5,000 Units at 10/10/24 0903   hydrocortisone  (CORTEF ) tablet 5 mg, 5 mg, Oral, Daily, Dibia, Pauline E, MD, 5 mg at 10/10/24 9095   levothyroxine  (SYNTHROID ) tablet 100 mcg, 100 mcg, Oral, Q0600, Dibia, Pauline E, MD, 100 mcg at 10/10/24 0859   LORazepam  (ATIVAN ) tablet 2 mg, 2 mg, Oral, Once, Dibia, Pauline E, MD   LORazepam  (ATIVAN ) tablet 6 mg, 6 mg, Oral, QHS, Dibia, Pauline E, MD, 6 mg at 10/09/24  2359   losartan  (COZAAR ) tablet 25 mg, 25 mg, Oral, Daily, Dibia, Pauline E, MD, 25 mg at 10/10/24 9095   ondansetron  (ZOFRAN ) tablet 4 mg, 4 mg, Oral, Q6H PRN **OR** ondansetron  (ZOFRAN ) injection 4 mg, 4 mg, Intravenous, Q6H PRN, Dibia, Pauline E, MD   potassium chloride  (KLOR-CON  M) CR tablet 10 mEq, 10 mEq, Oral, BID, Dibia, Pauline E, MD, 10 mEq at 10/10/24 9095   rosuvastatin  (CRESTOR ) tablet 20 mg, 20 mg, Oral, Daily, Jerri Pfeiffer, MD, 20 mg at 10/10/24 0904   sodium chloride  flush (NS) 0.9 % injection 3 mL, 3 mL, Intravenous, Q12H, Dibia, Pauline E, MD, 3 mL at 10/09/24 2103  Patients Current Diet:  Diet Order             Diet general           Diet regular Room service appropriate? Yes; Fluid consistency: Thin; Fluid restriction: Other (see comments)  Diet effective now                   Precautions / Restrictions Precautions Precautions: Fall Precaution/Restrictions Comments: Left AFO Restrictions Weight Bearing Restrictions Per Provider Order: No   Has the patient had 2 or more falls or a fall with injury in the past year? Yes  Prior Activity Level Limited Community (1-2x/wk): walks with RW and AFO at baseline, assist for some ADLs, home with 24/7 supevision since brain ca  Prior Functional Level Self Care: Did the patient need help bathing, dressing, using the toilet or eating? Needed some help  Indoor Mobility: Did the patient need assistance with walking from room to room (with or without device)? Independent  Stairs: Did the patient need assistance with internal or external stairs (with or without device)? Needed some help  Functional Cognition: Did the patient need help planning regular tasks such as shopping or remembering to take medications? Dependent  Patient Information Are you of Hispanic, Latino/a,or Spanish origin?: A. No, not of Hispanic, Latino/a, or Spanish origin What is your race?: B. Black or African American Do you need or want an  interpreter to communicate with a doctor or health care staff?: 0. No Patient information obtained via proxy : per chart  Patient's Response To:  Health Literacy and Transportation Is the patient able to respond to health literacy and transportation needs?: No Health Literacy - How often do you need to have someone help you when you read instructions, pamphlets, or other written material from your doctor or pharmacy?: Patient unable to respond In the past 12 months, has lack of transportation kept you from medical appointments or from getting medications?: No In the past 12 months, has lack of transportation kept you from meetings, work, or from getting things needed for daily living?: No Health Literacy and Transportation obtained via proxy: per parents  Journalist, Newspaper / Equipment Home Equipment: Agricultural Consultant (2 wheels), [employer] - single point  Prior Device Use: Indicate devices/aids used by the patient prior to current illness, exacerbation or injury? Orthotics/Prosthetics  Current Functional Level Cognition  Arousal/Alertness: Awake/alert Overall Cognitive Status: History of cognitive impairments - at baseline (dad feels it's worse but when describing he refers more to his dysarthria that is worse) Orientation Level: Appropriate for developmental age Attention: Sustained Sustained Attention: Appears intact (for verbal but suspect difficulties during tasks) Memory: Impaired Memory Impairment: Storage deficit, Retrieval deficit (0/3 words, 2 correct with semantic cues, 1 incorrect with choice, recalled ate breakfast and took meds, not recall stroke with delay) Awareness: Impaired Awareness Impairment: Intellectual impairment Problem Solving: Impaired Problem Solving Impairment: Verbal basic (most didn't understand due to dysarthria) Safety/Judgment: Impaired    Extremity Assessment (includes Sensation/Coordination)  Upper Extremity Assessment: LUE deficits/detail LUE  Deficits / Details: L hand contracted at wrist and with decreased strength from previous CVA post brain tumor per father LUE Sensation: WNL LUE Coordination: decreased gross motor, decreased fine motor  Lower Extremity Assessment: Defer to PT evaluation RLE Deficits / Details: Somewhat difficult to assess with assist of 1 at EOB as pt with posteior lean.  ROM is [facility]; MMT: grossly 4/5 throughout LLE Deficits / Details: Somewhat difficult to assess with assist of 1 at  EOB as pt with posteior lean.  Does have some deficits from prior CVA after brain tumor with limited L ankle ROM wearing AFO to ambulate and family reports L leg a little weaker than R baseline.  ROM: functional other than very limited ankle ROM with tight end feel all directions.  MMT: ankle 3/5, knee and hip 4-/5    ADLs  Overall ADL's : Needs assistance/impaired Eating/Feeding: Moderate assistance, Bed level Eating/Feeding Details (indicate cue type and reason): father feeding, but can usually complete himself Grooming: Minimal assistance, Sitting Upper Body Bathing: Minimal assistance, Sitting Lower Body Bathing: Maximal assistance, Sitting/lateral leans, Sit to/from stand Upper Body Dressing : Minimal assistance, Sitting Lower Body Dressing: Maximal assistance, Sit to/from stand, Sitting/lateral leans Lower Body Dressing Details (indicate cue type and reason): Pt able to demo reaching down to B socks to pull up while sitting EOB. Assist to pull up L sock needed. Use of this strategy to correct posterior bias sitting EOB Toilet Transfer: Moderate assistance, Stand-pivot, BSC/3in1, Rolling walker (2 wheels) Toilet Transfer Details (indicate cue type and reason): simulated from bed Toileting- Clothing Manipulation and Hygiene: Moderate assistance, Sitting/lateral lean, Sit to/from stand Functional mobility during ADLs: Moderate assistance, +2 for physical assistance, +2 for safety/equipment, Cueing for safety, Cueing for sequencing,  Rolling walker (2 wheels) General ADL Comments: Prior to this admission, patient living with his parents, able to ambulate without device, and able to complete dressing on his own. His father would assist with bathing for thoroughness. Currently, patient with need for mod A for ADL management, and mod A from an elevated surface to achieve standing. Patient with significant posterior lean in standing, therefore unable to attempt ambulation with only one person as support. Patient able to scoot to [facility] at supervision level, however requiring increased time and repetitions in order to be able to complete. Given prior level, and patient's 24/7 support, OT recommending intensive rehab >3 hours prior to discharge home. OT will continue to follow.    Mobility  Overal bed mobility: Needs Assistance Bed Mobility: Supine to Sit, Sit to Supine Supine to sit: Mod assist, HOB elevated Sit to supine: Mod assist, +2 for physical assistance, +2 for safety/equipment General bed mobility comments: cues/assist to initiate BLE to EOB, handheld assist to lift trunk with pt assisting to pull up. Mod A x 2 for truncal control and BLE back to bed.    Transfers  Overall transfer level: Needs assistance Equipment used: 2 person hand held assist Transfers: Sit to/from Stand Sit to Stand: Mod assist, +2 safety/equipment General transfer comment: Mod A to stand, +2 for safety to stand and once up, required up to Max A x 2 to maintain standing balance. Required manual assist to lift LLE for side steps along bedside    Ambulation / Gait / Stairs / Wheelchair Mobility  Ambulation/Gait Ambulation/Gait assistance: Mod assist, +2 safety/equipment Gait Distance (Feet): 150 Feet Assistive device: Rolling walker (2 wheels), 1 person hand held assist Gait Pattern/deviations: Step-to pattern, Decreased stride length, Ataxic, Shuffle, Decreased dorsiflexion - left, Narrow base of support General Gait Details: gait deferred for today  due to significant dynamic sitting and static standing balance deficits Gait velocity: decreased Gait velocity interpretation: <1.8 ft/sec, indicate of risk for recurrent falls    Posture / Balance Dynamic Sitting Balance Sitting balance - Comments: at least min assist for sitting balance however difficulty maintaining static sitting balance >10 sec with heavy posterrior lean. With practice able to self correct 50% of the time.  Intermittent right lateral lean in sitting as well. Balance Overall balance assessment: Needs assistance Sitting-balance support: Bilateral upper extremity supported, Feet supported Sitting balance-Leahy Scale: Poor Sitting balance - Comments: at least min assist for sitting balance however difficulty maintaining static sitting balance >10 sec with heavy posterrior lean. With practice able to self correct 50% of the time. Intermittent right lateral lean in sitting as well. Postural control: Posterior lean Standing balance support: Bilateral upper extremity supported Standing balance-Leahy Scale: Poor Standing balance comment: continued heavy posterior lean in standing, unable to self correct requiring max to total assist to maintain static standing balance; with heavy physical cues he could improve posture and anterior weight shift. lateral weight shifts with music completed, however continued to require at least mod assist for balance.    Special considerations/life events  Diabetic management A1C 6.0   Previous Home Environment (from acute therapy documentation) Living Arrangements: Parent  Lives With: Other (Comment) (parents) Available Help at Discharge: Available 24 hours/day Type of Home: House Home Layout: Two level, Bed/bath upstairs Alternate Level Stairs-Rails: Right Alternate Level Stairs-Number of Steps: 14 Home Access: Stairs to enter Entrance Stairs-Rails: None Entrance Stairs-Number of Steps: 2 Bathroom Shower/Tub: Engineer, Manufacturing Systems:  Handicapped height Home Care Services: No  Discharge Living Setting Plans for Discharge Living Setting: Patient's home, Lives with (comment) (parents) Type of Home at Discharge: House Discharge Home Layout: Bed/bath upstairs, Two level Alternate Level Stairs-Rails: Right Alternate Level Stairs-Number of Steps: 14 Discharge Home Access: Stairs to enter Entrance Stairs-Rails: None Entrance Stairs-Number of Steps: 2 Discharge Bathroom Shower/Tub: Tub/shower unit Discharge Bathroom Toilet: Handicapped height Discharge Bathroom Accessibility: Yes How Accessible: Accessible via walker Does the patient have any problems obtaining your medications?: No  Social/Family/Support Systems Anticipated Caregiver: parents Julez and [employer] Anticipated Caregiver's Contact Information: [place]  [phone]; [employer]  [phone] Ability/Limitations of Caregiver: no lifting Caregiver Availability: 24/7 Discharge Plan Discussed with Primary Caregiver: Yes Is Caregiver In Agreement with Plan?: Yes Does Caregiver/Family have Issues with Lodging/Transportation while Pt is in Rehab?: No  Goals Patient/Family Goal for Rehab: PT supervision to mod I, OT mod I to min assist, SLP supervision Expected length of stay: 14-18 days Additional Information: Discharge plan: home with parents who can provide supervision for mobility and min assist for adls. Pt/Family Agrees to Admission and willing to participate: Yes Program Orientation Provided & Reviewed with Pt/Caregiver Including Roles  & Responsibilities: Yes  Decrease burden of Care through IP rehab admission: n/a  Possible need for SNF placement upon discharge: Not anticipated.  Plan for home with parents who can provide supervision and assist for ADLs but no lifting.    Patient Condition: This patient's condition remains as documented in the consult dated 10/07/24, in which the Rehabilitation Physician determined and documented that the patient's condition  is appropriate for intensive rehabilitative care in an inpatient rehabilitation facility. Will admit to inpatient rehab today.  Preadmission Screen Completed By:  Reche FORBES Lowers, PT, DPT 10/10/2024 10:41 AM with updates by Heron Leavell RN MSN ______________________________________________________________________   Discussed status with Dr. Smt. Loder on 10/10/24 at 1040 and received approval for admission today.  Admission Coordinator:  Reche Lowers, PT. DTP with updates by Leavell Heron Lot, RN MSN time 1040 Date 10/10/24   Assessment/Plan: Diagnosis: L BG stroke Does the need for close, 24 hr/day Medical supervision in concert with the patient's rehab needs make it unreasonable for this patient to be served in a less intensive setting? Yes Co-Morbidities requiring supervision/potential complications: R hemiparesis;  pan hypopituitarism; cognitive disability since brain CA as teen; low Na chronic; seizure d/o; HTN Due to bladder management, bowel management, safety, skin/wound care, disease management, medication administration, pain management, and patient education, does the patient require 24 hr/day rehab nursing? Yes Does the patient require coordinated care of a physician, rehab nurse, PT, OT, and SLP to address physical and functional deficits in the context of the above medical diagnosis(es)? Yes Addressing deficits in the following areas: balance, endurance, locomotion, strength, transferring, bowel/bladder control, bathing, dressing, feeding, grooming, toileting, cognition, and speech Can the patient actively participate in an intensive therapy program of at least 3 hrs of therapy 5 days a week? Yes The potential for patient to make measurable gains while on inpatient rehab is good Anticipated functional outcomes upon discharge from inpatient rehab: modified independent, supervision, and min assist PT, modified independent, supervision, and min assist OT, supervision SLP Estimated  rehab length of stay to reach the above functional goals is: 14-18 days Anticipated discharge destination: Home 10. Overall Rehab/Functional Prognosis: good   MD Signature:

## 2024-10-08 NOTE — Progress Notes (Signed)
 Inpatient Rehab Admissions Coordinator:   Pending insurance auth.  Will follow.     Reche Lowers, PT, DPT Admissions Coordinator [phone] 10/08/24 8:22 AM

## 2024-10-08 NOTE — Progress Notes (Signed)
 Consultation Progress Note   Patient: Nicholas Holt FMW:[id number] DOB: 05-26-1981 DOA: 10/03/2024 DOS: the patient was seen and examined on 10/08/2024 Primary service: Drew Ellouise SQUIBB, MD  Brief hospital course: Nicholas Holt is a 43 y.o. male with medical history significant for brain cancer when he was a teenager and now has subsequent severe neurologic disability. He was brought in by his parents.  His mom reports that last night he was holding on to the walls to keep himself steady. This morning it was much worse and she thought he was going to fall even holding on to the wall.  He was very wobbly. Normally he can walk independently. He was also just repeating what she was saying over and over, not communicating clearly.  This was an abrupt change from 24 hours earlier so she brought him in for evaluation.   About a week ago he had a change in his medication.  His DDAVP  was changed from spray to pills.  He had been on DDAVP  spray for more than 15 years. The mom first called his endocrinologist and they were both worried about his sodium level so the endocrinologist advised bringing him to the ED.   Assessment and Plan: Acute CVA   Acute gait instability and communication decline - MRI shows 8 mm acute infarction in the posterior limb of the internal capsule on the left. 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  -Echo reviewed, ejection fraction of  60 to 65%.  The left ventricle has normal function. The left ventricle has no regional wall motion abnormalities.   -The interatrial septum was not well visualized.  -Neurology consulted, they recommended DAPT for 3 weeks and then plavix  alone.   LDL <70, HgbA1c- 6.0 -PT/OT/SLP ordered  - Patient still continues to complain of feeling dizzy upon ambulation.  Started the patient on meclizine to  see if this will help.   2.  Panhypopituitarism -patient is on hydrocortisone , levothyroxine  and DDAVP  Follows with Endo-Dr Kerr-[phone] -Family reports recent change from the DDAVP  spray to pills. -Patient still with intact thirst -Sodium trend since admission --144-136--133--129--138 - Patient was on 0.2mg  of DDAVP  TID, spoke with his primary endocrinologist on 11/25 about his downtrending sodium and he recommended reducing dose of DDAVP  to 0.1mg  TID. -He also advised not to restrict fluids, but to allow patient to have fluids when he wants to drink and should not be allowed to drink excessively. - Continue to monitor Sodium - After reviewing patient's chart Dr. Faythe recommended skipping a.m. dose of DDAVP   - Currently the sodium level is increased to 146.  Will continue today's dose of DDAVP  and despite this if the sodium level continues to trend up then we will need to consult his endocrinologist about putting him back on the original dose of 0.2 mg.    3.  Mild obstructive sleep apnea - The patient was not able to tolerate CPAP mask even with his 6 mg of Ativan  at nightly   4.  Cognitive disability - Monitor for agitation during his hospital stay. Since he takes Ativan  nightly Ativan  will probably be the best way medical management.      [facility] will continue to follow the patient.  Subjective: Patient seen at bedside this morning, no acute overnight events.  Spoke to the patient's father at bedside.  Patient still continues to have dizziness and needs  2 people to assist with walking.  Initiated the patient on meclizine today.  Patient's Sodium levels have increased to 144.  Physical Exam: Vitals:   10/07/24 1932 10/08/24 0029 10/08/24 0513 10/08/24 1158  BP: 120/76 101/67 119/78 132/81  Pulse: 97  98 92  Resp:      Temp: 98.1 F (36.7 C)  98.1 F (36.7 C) 98.4 F (36.9 C)  TempSrc:      SpO2: 97% 94% 100% 100%  Weight:      Height:      General: Alert in no acute  distress. HEENT: Normocephalic, atraumatic, PERRL Cardiovascular: Normal rate and rhythm. Distal pulses intact. Pulmonary: Normal pulmonary effort, normal breath sounds Gastrointestinal: Nondistended abdomen, soft, non-tender, normoactive bowel sounds Musculoskeletal:Normal ROM, no lower ext edema Lymphadenopathy: No cervical LAD. Neuro-Alert and oriented x 2-3, moving all 4 extremities Skin: Skin is warm and dry. PSYCH: Attentive and cooperative to the best of his ability.  Data Reviewed:    CBC    Component Value Date/Time   WBC 7.0 10/06/2024 0926   RBC 4.59 10/06/2024 0926   HGB 13.8 10/06/2024 0926   HGB 13.4 07/08/2024 0858   HCT 40.6 10/06/2024 0926   HCT 41.8 07/08/2024 0858   PLT 165 10/06/2024 0926   PLT 133 (L) 07/08/2024 0858   MCV 88.5 10/06/2024 0926   MCV 95 07/08/2024 0858   MCH 30.1 10/06/2024 0926   MCHC 34.0 10/06/2024 0926   RDW 14.5 10/06/2024 0926   RDW 13.1 07/08/2024 0858   LYMPHSABS 2.0 10/03/2024 1724   LYMPHSABS 3.0 08/19/2019 0934   MONOABS 0.4 10/03/2024 1724   EOSABS 0.1 10/03/2024 1724   EOSABS 0.1 08/19/2019 0934   BASOSABS 0.0 10/03/2024 1724   BASOSABS 0.0 08/19/2019 0934   CMP     Component Value Date/Time   NA 146 (H) 10/08/2024 0323   NA 144 07/08/2024 0858   K 4.7 10/08/2024 0323   CL 114 (H) 10/08/2024 0323   CO2 22 10/08/2024 0323   GLUCOSE 107 (H) 10/08/2024 0323   BUN 13 10/08/2024 0323   BUN 16 07/08/2024 0858   CREATININE 1.31 (H) 10/08/2024 0323   CREATININE 0.86 02/09/2015 1032   CALCIUM  9.5 10/08/2024 0323   CALCIUM  9.2 03/27/2008 1350   PROT 7.3 10/08/2024 0323   PROT 7.4 02/06/2018 1558   ALBUMIN 3.8 10/08/2024 0323   ALBUMIN 4.4 02/06/2018 1558   AST 32 10/08/2024 0323   ALT 36 10/08/2024 0323   ALKPHOS 58 10/08/2024 0323   BILITOT 0.3 10/08/2024 0323   BILITOT <0.2 02/06/2018 1558   EGFR 83 07/08/2024 0858   GFRNONAA >60 10/08/2024 0323   GFRNONAA >89 02/09/2015 1032    Family Communication: Plan  discussed with mother and father at bedside  Time spent: 35 minutes.  Author: Ellouise SHAUNNA Ra, MD 10/08/2024 12:18 PM  For on call review [email or web address].

## 2024-10-08 NOTE — Plan of Care (Signed)
  Problem: Clinical Measurements: Goal: Diagnostic test results will improve Outcome: Progressing   Problem: Nutrition: Goal: Adequate nutrition will be maintained Outcome: Progressing   Problem: Skin Integrity: Goal: Risk for impaired skin integrity will decrease Outcome: Progressing   Problem: Pain Managment: Goal: General experience of comfort will improve and/or be controlled Outcome: Progressing

## 2024-10-09 DIAGNOSIS — R269 Unspecified abnormalities of gait and mobility: Secondary | ICD-10-CM | POA: Diagnosis not present

## 2024-10-09 LAB — COMPREHENSIVE METABOLIC PANEL WITH GFR
ALT: 40 U/L (ref 0–44)
AST: 34 U/L (ref 15–41)
Albumin: 3.5 g/dL (ref 3.5–5.0)
Alkaline Phosphatase: 54 U/L (ref 38–126)
Anion gap: 10 (ref 5–15)
BUN: 15 mg/dL (ref 6–20)
CO2: 23 mmol/L (ref 22–32)
Calcium: 9.1 mg/dL (ref 8.9–10.3)
Chloride: 111 mmol/L (ref 98–111)
Creatinine, Ser: 1.34 mg/dL — ABNORMAL HIGH (ref 0.61–1.24)
GFR, Estimated: >60 mL/min (ref >60)
Glucose, Bld: 109 mg/dL — ABNORMAL HIGH (ref 70–99)
Potassium: 4.5 mmol/L (ref 3.5–5.1)
Sodium: 144 mmol/L (ref 135–145)
Total Bilirubin: 0.4 mg/dL (ref 0.0–1.2)
Total Protein: 6.7 g/dL (ref 6.5–8.1)

## 2024-10-09 NOTE — Hospital Course (Signed)
 Nicholas Holt is a 43 y.o. male with medical history significant for brain cancer when he was a teenager and now has subsequent severe neurologic disability. He was brought in by his parents.  His mom reports that last night he was holding on to the walls to keep himself steady. This morning it was much worse and she thought he was going to fall even holding on to the wall.  He was very wobbly. Normally he can walk independently. He was also just repeating what she was saying over and over, not communicating clearly.  This was an abrupt change from 24 hours earlier so she brought him in for evaluation.   About a week ago he had a change in his medication.  His DDAVP  was changed from spray to pills.  He had been on DDAVP  spray for more than 15 years. The mom first called his endocrinologist and they were both worried about his sodium level so the endocrinologist advised bringing him to the ED.    Assessment and Plan:  Acute CVA   Acute gait instability and communication decline - MRI brain showed acute infarct in the posterior limb of the internal capsule on the left.  CTA head and neck without large vessel occlusion.  2D echocardiogram with LV ejection fraction of 60 to 65% with no regional wall motion abnormality.  Neurology was consulted and recommended dual antiplatelets for 3 weeks then Plavix  alone.  LDL less than 70 and hemoglobin A1c of 6.0.  Had dizziness so meclizine was started.  Physical therapy was consulted and has recommended CIR at this time.   Panhypopituitarism -patient is on hydrocortisone , levothyroxine  and DDAVP  Follows with Endocrinology-Dr Faythe.  There was recent change from DTE APP's.  Pills.  Sodium level of 138 on presentation.  Currently on 0.1 mg 3 times daily.  No plan for restriction of fluids.  Latest sodium level of 11/13/1942.  Might need to increase dose to 0.2 3 times daily if continues to rise.    Mild obstructive sleep apnea Was not able to tolerate CPAP despite  being on Ativan .    Cognitive disability -on Ativan  at nighttime.

## 2024-10-09 NOTE — Plan of Care (Signed)

## 2024-10-09 NOTE — Plan of Care (Signed)
  Problem: Education: Goal: Knowledge of General Education information will improve Description Including pain rating scale, medication(s)/side effects and non-pharmacologic comfort measures Outcome: Progressing   Problem: Health Behavior/Discharge Planning: Goal: Ability to manage health-related needs will improve Outcome: Progressing   Problem: Clinical Measurements: Goal: Ability to maintain clinical measurements within normal limits will improve Outcome: Progressing Goal: Will remain free from infection Outcome: Progressing Goal: Diagnostic test results will improve Outcome: Progressing Goal: Respiratory complications will improve Outcome: Progressing Goal: Cardiovascular complication will be avoided Outcome: Progressing   Problem: Activity: Goal: Risk for activity intolerance will decrease Outcome: Progressing   Problem: Safety: Goal: Ability to remain free from injury will improve Outcome: Progressing   Problem: Skin Integrity: Goal: Risk for impaired skin integrity will decrease Outcome: Progressing   

## 2024-10-09 NOTE — Progress Notes (Signed)
 PROGRESS NOTE  Nicholas Holt FMW:[id number] DOB: 09-30-1981 DOA: 10/03/2024 PCP: Karna Fellows, MD   LOS: 6 days   Brief narrative:  Nicholas Holt is a 43 y.o. male with medical history significant for brain cancer when he was a teenager and now has subsequent severe neurologic disability. He was brought in by his parents.  His mom reports that last night he was holding on to the walls to keep himself steady. This morning it was much worse and she thought he was going to fall even holding on to the wall.  He was very wobbly. Normally he can walk independently. He was also just repeating what she was saying over and over, not communicating clearly.  This was an abrupt change from 24 hours earlier so she brought him in for evaluation.   About a week ago he had a change in his medication.  His DDAVP  was changed from spray to pills.  He had been on DDAVP  spray for more than 15 years. The mom first called his endocrinologist and they were both worried about his sodium level so the endocrinologist advised bringing him to the ED.      Assessment/Plan: Principal Problem:   Gait abnormality Active Problems:   Panhypopituitarism of anterior and posterior pituitary   Seizure disorder (HCC)   H/O malignant neoplasm of brain   Multiple lacunar infarcts (HCC)   OSA (obstructive sleep apnea)   Elevated CK  Acute CVA   Acute gait instability and communication decline - MRI brain showed acute infarct in the posterior limb of the internal capsule on the left.  CTA head and neck without large vessel occlusion.  2D echocardiogram with LV ejection fraction of 60 to 65% with no regional wall motion abnormality.  Neurology was consulted and recommended dual antiplatelets for 3 weeks then Plavix  alone.  LDL less than 70 and hemoglobin A1c of 6.0.  Had dizziness so meclizine was started.  Physical therapy was consulted and has recommended CIR at this time.   Panhypopituitarism -patient is on hydrocortisone ,  levothyroxine  and DDAVP . Follows with Endocrinology-Dr Faythe.  There was recent change from DTE APP's.  Pills.  Sodium level of 138 on presentation.  Currently on 0.1 mg 3 times daily.  No plan for restriction of fluids.  Latest sodium level of 11/13/1942.  Might need to increase dose to 0.2 3 times daily if continues to rise.    Mild obstructive sleep apnea Was not able to tolerate CPAP despite being on Ativan .    Cognitive disability -on Ativan  at nighttime.  DVT prophylaxis: heparin  injection 5,000 Units Start: 10/05/24 2200   Disposition: CIR as per PT evaluation  Status is: Inpatient Remains inpatient appropriate because: Need for CIR.    Code Status:     Code Status: Full Code  Family Communication: Spoke with the patient's parents at bedside.  Consultants: Neurology Rehabilitation  Procedures: None  Anti-infectives:  None  Anti-infectives (From admission, onward)    None        Subjective: Today, patient was seen and examined at bedside.  Patient feels okay.  Denies any nausea vomiting fever shortness of breath dyspnea or chest pain.  Patient's parents at bedside.  Objective: Vitals:   10/09/24 0425 10/09/24 0759  BP: 117/76 132/84  Pulse: 87 87  Resp:  16  Temp: 98.1 F (36.7 C) 98.3 F (36.8 C)  SpO2: 98% 97%    Intake/Output Summary (Last 24 hours) at 10/09/2024 1116 Last data filed at 10/09/2024 [phone]  Gross per 24 hour  Intake 10 ml  Output 350 ml  Net -340 ml   Filed Weights   10/03/24 1636 10/04/24 0651  Weight: 89.8 kg 89.9 kg   Body mass index is 30.14 kg/m.   Physical Exam: GENERAL: Patient is alert awake and Communicative.  Oriented 2-3, slow to respond, not in obvious distress. HENT: No scleral pallor or icterus. Pupils equally reactive to light. Oral mucosa is moist NECK: is supple, no gross swelling noted. CHEST: Clear to auscultation. No crackles or wheezes.  Diminished breath sounds bilaterally. CVS: S1 and S2 heard, no  murmur. Regular rate and rhythm.  ABDOMEN: Soft, non-tender, bowel sounds are present. EXTREMITIES: No edema. CNS: Cranial nerves are intact.  Left-sided weakness more than the right SKIN: warm and dry without rashes.  Data Review: I have personally reviewed the following laboratory data and studies,  CBC: Recent Labs  Lab 10/03/24 1724 10/04/24 0901 10/06/24 0926  WBC 5.8 5.3 7.0  NEUTROABS 3.3  --   --   HGB 13.1 12.9* 13.8  HCT 40.4 39.3 40.6  MCV 92.0 91.8 88.5  PLT 173 147* 165   Basic Metabolic Panel: Recent Labs  Lab 10/06/24 0926 10/07/24 0929 10/08/24 0323 10/08/24 1450 10/09/24 0421  NA 129* 138 146* 144 144  K 4.1 4.9 4.7 4.5 4.5  CL 96* 106 114* 112* 111  CO2 23 22 22 22 23   GLUCOSE 76 78 107* 121* 109*  BUN 12 12 13 14 15   CREATININE 1.07 1.08 1.31* 1.19 1.34*  CALCIUM  8.6* 9.3 9.5 9.6 9.1   Liver Function Tests: Recent Labs  Lab 10/03/24 1724 10/06/24 0926 10/07/24 0929 10/08/24 0323 10/09/24 0421  AST 36 29 32 32 34  ALT 36 26 31 36 40  ALKPHOS 65 52 56 58 54  BILITOT 0.4 0.6 0.5 0.3 0.4  PROT 7.6 6.7 7.5 7.3 6.7  ALBUMIN 4.6 3.6 4.0 3.8 3.5   No results for input(s): LIPASE, AMYLASE in the last 168 hours. No results for input(s): AMMONIA in the last 168 hours. Cardiac Enzymes: No results for input(s): CKTOTAL, CKMB, CKMBINDEX, TROPONINI in the last 168 hours. BNP (last 3 results) No results for input(s): BNP in the last 8760 hours.  ProBNP (last 3 results) No results for input(s): PROBNP in the last 8760 hours.  CBG: Recent Labs  Lab 10/03/24 1630 10/05/24 1013  GLUCAP 95 91   No results found for this or any previous visit (from the past 240 hours).   Studies: No results found.    Vernal Alstrom, MD  [facility] 10/09/2024  If 7PM-7AM, please contact night-coverage

## 2024-10-10 ENCOUNTER — Inpatient Hospital Stay (HOSPITAL_COMMUNITY)

## 2024-10-10 ENCOUNTER — Other Ambulatory Visit: Payer: Self-pay

## 2024-10-10 ENCOUNTER — Inpatient Hospital Stay (HOSPITAL_COMMUNITY)
Admission: AD | Admit: 2024-10-10 | Discharge: 2024-10-31 | DRG: 057 | Disposition: A | Discharge status: Home / Self Care | Source: Intra-hospital | Attending: Physical Medicine & Rehabilitation | Admitting: Physical Medicine & Rehabilitation

## 2024-10-10 ENCOUNTER — Encounter (HOSPITAL_COMMUNITY): Payer: Self-pay | Admitting: Physical Medicine & Rehabilitation

## 2024-10-10 DIAGNOSIS — I639 Cerebral infarction, unspecified: Principal | ICD-10-CM

## 2024-10-10 DIAGNOSIS — Z85841 Personal history of malignant neoplasm of brain: Secondary | ICD-10-CM

## 2024-10-10 DIAGNOSIS — Z833 Family history of diabetes mellitus: Secondary | ICD-10-CM

## 2024-10-10 DIAGNOSIS — E870 Hyperosmolality and hypernatremia: Secondary | ICD-10-CM | POA: Diagnosis present

## 2024-10-10 DIAGNOSIS — E230 Hypopituitarism: Secondary | ICD-10-CM | POA: Diagnosis present

## 2024-10-10 DIAGNOSIS — I69354 Hemiplegia and hemiparesis following cerebral infarction affecting left non-dominant side: Principal | ICD-10-CM

## 2024-10-10 DIAGNOSIS — Z8616 Personal history of COVID-19: Secondary | ICD-10-CM | POA: Diagnosis not present

## 2024-10-10 DIAGNOSIS — G8194 Hemiplegia, unspecified affecting left nondominant side: Secondary | ICD-10-CM | POA: Diagnosis not present

## 2024-10-10 DIAGNOSIS — Z83438 Family history of other disorder of lipoprotein metabolism and other lipidemia: Secondary | ICD-10-CM

## 2024-10-10 DIAGNOSIS — F419 Anxiety disorder, unspecified: Secondary | ICD-10-CM | POA: Diagnosis present

## 2024-10-10 DIAGNOSIS — R059 Cough, unspecified: Secondary | ICD-10-CM | POA: Diagnosis present

## 2024-10-10 DIAGNOSIS — G40909 Epilepsy, unspecified, not intractable, without status epilepticus: Secondary | ICD-10-CM | POA: Diagnosis present

## 2024-10-10 DIAGNOSIS — I69322 Dysarthria following cerebral infarction: Secondary | ICD-10-CM | POA: Diagnosis not present

## 2024-10-10 DIAGNOSIS — E785 Hyperlipidemia, unspecified: Secondary | ICD-10-CM | POA: Diagnosis present

## 2024-10-10 DIAGNOSIS — R269 Unspecified abnormalities of gait and mobility: Secondary | ICD-10-CM | POA: Diagnosis not present

## 2024-10-10 DIAGNOSIS — F988 Other specified behavioral and emotional disorders with onset usually occurring in childhood and adolescence: Secondary | ICD-10-CM | POA: Diagnosis present

## 2024-10-10 DIAGNOSIS — M24532 Contracture, left wrist: Secondary | ICD-10-CM | POA: Diagnosis present

## 2024-10-10 DIAGNOSIS — G8114 Spastic hemiplegia affecting left nondominant side: Secondary | ICD-10-CM | POA: Diagnosis not present

## 2024-10-10 DIAGNOSIS — E039 Hypothyroidism, unspecified: Secondary | ICD-10-CM | POA: Diagnosis present

## 2024-10-10 DIAGNOSIS — K5900 Constipation, unspecified: Secondary | ICD-10-CM | POA: Diagnosis present

## 2024-10-10 DIAGNOSIS — I69393 Ataxia following cerebral infarction: Secondary | ICD-10-CM | POA: Diagnosis not present

## 2024-10-10 DIAGNOSIS — Z7982 Long term (current) use of aspirin: Secondary | ICD-10-CM | POA: Diagnosis not present

## 2024-10-10 DIAGNOSIS — Z79899 Other long term (current) drug therapy: Secondary | ICD-10-CM | POA: Diagnosis not present

## 2024-10-10 DIAGNOSIS — Z8249 Family history of ischemic heart disease and other diseases of the circulatory system: Secondary | ICD-10-CM | POA: Diagnosis not present

## 2024-10-10 DIAGNOSIS — G4733 Obstructive sleep apnea (adult) (pediatric): Secondary | ICD-10-CM | POA: Diagnosis present

## 2024-10-10 DIAGNOSIS — Z9221 Personal history of antineoplastic chemotherapy: Secondary | ICD-10-CM

## 2024-10-10 DIAGNOSIS — Z923 Personal history of irradiation: Secondary | ICD-10-CM

## 2024-10-10 DIAGNOSIS — F959 Tic disorder, unspecified: Secondary | ICD-10-CM | POA: Diagnosis present

## 2024-10-10 DIAGNOSIS — M24522 Contracture, left elbow: Secondary | ICD-10-CM | POA: Diagnosis present

## 2024-10-10 DIAGNOSIS — Z79890 Hormone replacement therapy (postmenopausal): Secondary | ICD-10-CM

## 2024-10-10 DIAGNOSIS — G9389 Other specified disorders of brain: Secondary | ICD-10-CM | POA: Diagnosis present

## 2024-10-10 DIAGNOSIS — K5901 Slow transit constipation: Secondary | ICD-10-CM | POA: Diagnosis not present

## 2024-10-10 DIAGNOSIS — I10 Essential (primary) hypertension: Secondary | ICD-10-CM | POA: Diagnosis present

## 2024-10-10 DIAGNOSIS — R7303 Prediabetes: Secondary | ICD-10-CM | POA: Diagnosis present

## 2024-10-10 LAB — CBC
HCT: 42.6 % (ref 39.0–52.0)
Hemoglobin: 14 g/dL (ref 13.0–17.0)
MCH: 29.9 pg (ref 26.0–34.0)
MCHC: 32.9 g/dL (ref 30.0–36.0)
MCV: 90.8 fL (ref 80.0–100.0)
Platelets: 195 K/uL (ref 150–400)
RBC: 4.69 MIL/uL (ref 4.22–5.81)
RDW: 15.9 % — ABNORMAL HIGH (ref 11.5–15.5)
WBC: 10.6 K/uL — ABNORMAL HIGH (ref 4.0–10.5)
nRBC: 0 % (ref 0.0–0.2)

## 2024-10-10 LAB — BASIC METABOLIC PANEL WITH GFR
Anion gap: 10 (ref 5–15)
BUN: 13 mg/dL (ref 6–20)
CO2: 25 mmol/L (ref 22–32)
Calcium: 9.2 mg/dL (ref 8.9–10.3)
Chloride: 111 mmol/L (ref 98–111)
Creatinine, Ser: 1.19 mg/dL (ref 0.61–1.24)
GFR, Estimated: >60 mL/min (ref >60)
Glucose, Bld: 98 mg/dL (ref 70–99)
Potassium: 3.9 mmol/L (ref 3.5–5.1)
Sodium: 146 mmol/L — ABNORMAL HIGH (ref 135–145)

## 2024-10-10 LAB — MAGNESIUM: Magnesium: 1.9 mg/dL (ref 1.7–2.4)

## 2024-10-10 MED ORDER — POTASSIUM CHLORIDE CRYS ER 10 MEQ PO TBCR
10.0000 meq | EXTENDED_RELEASE_TABLET | Freq: Two times a day (BID) | ORAL | Status: DC
  Administered 2024-10-10 – 2024-10-31 (×42): 10 meq via ORAL
  Filled 2024-10-10 (×42): qty 1

## 2024-10-10 MED ORDER — ROSUVASTATIN CALCIUM 20 MG PO TABS
20.0000 mg | ORAL_TABLET | Freq: Every day | ORAL | Status: DC
  Administered 2024-10-11 – 2024-10-31 (×21): 20 mg via ORAL
  Filled 2024-10-10 (×22): qty 1

## 2024-10-10 MED ORDER — GUAIFENESIN-DM 100-10 MG/5ML PO SYRP
5.0000 mL | ORAL_SOLUTION | Freq: Four times a day (QID) | ORAL | Status: DC | PRN
  Administered 2024-10-24 – 2024-10-30 (×6): 10 mL via ORAL
  Filled 2024-10-10 (×6): qty 10

## 2024-10-10 MED ORDER — PROCHLORPERAZINE 25 MG RE SUPP: 12.5000 mg | Freq: Four times a day (QID) | RECTAL | Status: DC | PRN

## 2024-10-10 MED ORDER — HYDROCORTISONE 5 MG PO TABS
5.0000 mg | ORAL_TABLET | Freq: Every day | ORAL | Status: DC
  Administered 2024-10-11 – 2024-10-14 (×4): 5 mg via ORAL
  Filled 2024-10-10 (×4): qty 1

## 2024-10-10 MED ORDER — ALUM & MAG HYDROXIDE-SIMETH 200-200-20 MG/5ML PO SUSP: 30.0000 mL | ORAL | Status: DC | PRN

## 2024-10-10 MED ORDER — DESMOPRESSIN ACETATE 0.1 MG PO TABS
0.2000 mg | ORAL_TABLET | Freq: Three times a day (TID) | ORAL | Status: DC
  Administered 2024-10-10: 0.2 mg via ORAL
  Filled 2024-10-10 (×3): qty 2

## 2024-10-10 MED ORDER — ONDANSETRON HCL 4 MG PO TABS: 4.0000 mg | ORAL_TABLET | Freq: Four times a day (QID) | ORAL | Status: DC | PRN

## 2024-10-10 MED ORDER — CLOPIDOGREL BISULFATE 75 MG PO TABS
75.0000 mg | ORAL_TABLET | Freq: Every day | ORAL | Status: DC
  Administered 2024-10-11 – 2024-10-31 (×21): 75 mg via ORAL
  Filled 2024-10-10 (×22): qty 1

## 2024-10-10 MED ORDER — DIPHENHYDRAMINE HCL 25 MG PO CAPS: 25.0000 mg | ORAL_CAPSULE | Freq: Four times a day (QID) | ORAL | Status: DC | PRN

## 2024-10-10 MED ORDER — PROCHLORPERAZINE EDISYLATE 10 MG/2ML IJ SOLN: 5.0000 mg | Freq: Four times a day (QID) | INTRAMUSCULAR | Status: DC | PRN

## 2024-10-10 MED ORDER — BISACODYL 10 MG RE SUPP: 10.0000 mg | Freq: Every day | RECTAL | Status: DC | PRN

## 2024-10-10 MED ORDER — FLEET ENEMA RE ENEM: 1.0000 | ENEMA | Freq: Once | RECTAL | Status: DC | PRN

## 2024-10-10 MED ORDER — PROCHLORPERAZINE MALEATE 5 MG PO TABS: 5.0000 mg | ORAL_TABLET | Freq: Four times a day (QID) | ORAL | Status: DC | PRN

## 2024-10-10 MED ORDER — ACETAMINOPHEN 325 MG PO TABS
325.0000 mg | ORAL_TABLET | ORAL | Status: DC | PRN
  Administered 2024-10-10: 650 mg via ORAL
  Filled 2024-10-10 (×3): qty 2

## 2024-10-10 MED ORDER — SENNOSIDES-DOCUSATE SODIUM 8.6-50 MG PO TABS
2.0000 | ORAL_TABLET | Freq: Every day | ORAL | Status: DC
  Administered 2024-10-10 – 2024-10-31 (×20): 2 via ORAL
  Filled 2024-10-10 (×23): qty 2

## 2024-10-10 MED ORDER — LORAZEPAM 1 MG PO TABS
6.0000 mg | ORAL_TABLET | Freq: Every day | ORAL | Status: DC
  Administered 2024-10-10 – 2024-10-28 (×19): 6 mg via ORAL
  Filled 2024-10-10 (×7): qty 6
  Filled 2024-10-10: qty 12
  Filled 2024-10-10 (×6): qty 6
  Filled 2024-10-10: qty 12
  Filled 2024-10-10 (×4): qty 6

## 2024-10-10 MED ORDER — LEVOTHYROXINE SODIUM 100 MCG PO TABS
100.0000 ug | ORAL_TABLET | Freq: Every day | ORAL | Status: DC
  Administered 2024-10-11 – 2024-10-31 (×21): 100 ug via ORAL
  Filled 2024-10-10 (×21): qty 1

## 2024-10-10 MED ORDER — ENOXAPARIN SODIUM 40 MG/0.4ML IJ SOSY
40.0000 mg | PREFILLED_SYRINGE | INTRAMUSCULAR | Status: DC
  Administered 2024-10-10 – 2024-10-21 (×12): 40 mg via SUBCUTANEOUS
  Filled 2024-10-10 (×12): qty 0.4

## 2024-10-10 MED ORDER — ASPIRIN 81 MG PO CHEW
81.0000 mg | CHEWABLE_TABLET | Freq: Every day | ORAL | Status: DC
  Administered 2024-10-11 – 2024-10-31 (×21): 81 mg via ORAL
  Filled 2024-10-10 (×22): qty 1

## 2024-10-10 MED ORDER — CLOPIDOGREL BISULFATE 75 MG PO TABS: 75.0000 mg | ORAL_TABLET | Freq: Every day | ORAL | Status: DC

## 2024-10-10 MED ORDER — CYCLOBENZAPRINE HCL 5 MG PO TABS: 5.0000 mg | ORAL_TABLET | Freq: Every evening | ORAL | Status: DC | PRN

## 2024-10-10 MED ORDER — BISACODYL 5 MG PO TBEC: 5.0000 mg | DELAYED_RELEASE_TABLET | Freq: Every day | ORAL | Status: AC | PRN

## 2024-10-10 MED ORDER — LOSARTAN POTASSIUM 25 MG PO TABS
25.0000 mg | ORAL_TABLET | Freq: Every day | ORAL | Status: DC
  Administered 2024-10-11 – 2024-10-31 (×21): 25 mg via ORAL
  Filled 2024-10-10 (×23): qty 1

## 2024-10-10 MED ORDER — DESMOPRESSIN ACETATE 0.1 MG PO TABS
0.2000 mg | ORAL_TABLET | Freq: Three times a day (TID) | ORAL | Status: DC
  Administered 2024-10-10 – 2024-10-12 (×7): 0.2 mg via ORAL
  Filled 2024-10-10 (×9): qty 2

## 2024-10-10 NOTE — H&P (Signed)
 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 Family reported recent change of DDAVP  from intranasal to oral route and was advised to present to ED by endocrinology due to concerns of sodium level which was 129 at admission. CT head negative for acute changes and showed right burr hole from prior ventric with anterior right frontal encephalomalacia and chronic encephalomalacia left basal ganglia with finding c/w Fahr disease. Neurology consulted and MRI brain done revealing 8 mm acute infarct in posterior limb left internal capsule. CTA brain was negative for LVO. Dr. Jerri felt that stroke likely related to small vessel radiation related vasculopathy and recommended DAPT X 3 week followed by Plavix  alone.    He was place on fluid restriction briefly but Dr. Faythe advised against this and DDAVP  dose  briefly reduced to  1 mg tid. Sodium has started trending up and DDAVP  increased to 2 mg TID today.  Reported to have poor sleep last night due to attempts to use male purewick as well as low grade fever  T- 99.9 this am    PT/OT has been working with patient who continues to be limited by left sided weakness with dysarthria, has significant posterior bias and right lateral lean needing multimodal cues to self correct with activity, has decreased insight with poor awareness of deficits and is working on pre-gait activity.  PTA: He was independent without AD. Father assisted with ADL. Mother sets out meds. CIR recommended due to functional decline.    Pt's mother reports pt has a runny nose and a mild cough- since his  radiation and chemo, he has difficulty coughing up phlegm when sick.  She also reports he had a low grade 99.9 axillary temp last night- and this AM was 99.1 by 10:30am.  Pt also more sleepy, but didn't rest well= upset about purewick they attempted to use last night.  He usually gets up to bathroom to void/have BM at home.  LBM 2 days ago- and dad hurt his back taking pt to bathroom at that time.        Review of Systems  Unable to perform ROS: Mental acuity  All other systems reviewed and are negative.          Past Medical History:  Diagnosis Date   Anxiety     Attention deficit disorder 08/08/2006   Bilateral leg cramps 11/25/2006   Blood transfusion without reported diagnosis 1997    Chemotherapy associated   Brain cancer (HCC) 1998   Cough 12/08/2015   COVID-19 10/16/2017   Elevated hemidiaphragm 07/09/2015    Left, presumably after portacath placement.    Fall 04/13/2022   H/O malignant neoplasm of brain 09/22/2014    Suprasellar germinoma treated at [facility] in 1997; received chemotherapy (cisplatin, etoposide, vincristine, and cyclophosphomide) and radiation therapy.  Complicated by left sided motor tic disorder and panhypopituitarism.    Hyperlipidemia 08/08/2006   Hypertension     Hyponatremia 05/30/2022   Hypothyroidism     Imbalance      at risk for falls   Night sweats 02/06/2018  Obesity (BMI 30.0-34.9) 07/09/2015   OSA (obstructive sleep apnea) 07/06/2022   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) 08/08/2006    Following therapy for the suprasellar germinoma.  Includes central diabetes insipidus, secondary hypothyroidism, secondary adrenal insufficiency, and secondary hypogonadism.    Perennial allergic rhinitis 07/09/2015   Pre-diabetes      no meds   Preventative health care 07/09/2015   Seizure disorder (HCC) 08/08/2006    Predated brain tumor.  Generalized seizure ocurred after running out of lorazepam  post-tumor.     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 Treated with alendronate  from 2009-2016. DEXA (03/16/2008): L Spine T -2.0. L fem neck T -0.9, R fem neck T -1.9.  DEXA (10/15/2014): L Spine T -1.3, L fem neck T -0.6, R fem neck T -1.8.  Bisphosphonate holiday 06/2015.  Repeat DEXA scan in 10/2016 to reassess.    Weakness 09/15/2023               Past Surgical History:  Procedure Laterality Date   BRAIN BIOPSY   11/14/1995   CSF SHUNT   11/14/1995   PORTACATH PLACEMENT       portacath removal       RADIOLOGY WITH ANESTHESIA N/A 08/30/2021    Procedure: MRI WITH ANESTHESIA BRAIN WITH AND WITHOUT CONTRAST;  Surgeon: Radiologist, Medication, MD;  Location: [facility] OR;  Service: Radiology;  Laterality: N/A;   RADIOLOGY WITH ANESTHESIA N/A 05/25/2022    Procedure: MRI WITH ANESTHESIA OF BRAIN WITH AND WITHOUT CONTRAST;  Surgeon: Radiologist, Medication, MD;  Location: [facility] OR;  Service: Radiology;  Laterality: N/A;   RADIOLOGY WITH ANESTHESIA N/A 10/05/2024    Procedure: MRI WITH ANESTHESIA;  Surgeon: Radiologist, Medication, MD;  Location: [facility] OR;  Service: Radiology;  Laterality: N/A;               Family History  Problem Relation Age of Onset   Hypertension Mother     Hypertension Father     Hyperlipidemia Father     Prostate cancer Father     Diabetes type I Sister          Died of complications of diabetes in her 9's   Sleep apnea Maternal Aunt            Social History:  reports that he has never smoked. He has never been exposed to tobacco smoke. He has never used smokeless tobacco. He reports that he does not drink alcohol and does not use drugs.     Allergies:  Allergies  No Known Allergies             Medications Prior to Admission  Medication Sig Dispense Refill   Apoaequorin (PREVAGEN) 10 MG CAPS Take 10 mg by mouth daily.       ascorbic acid (VITAMIN C) 1000 MG tablet Take 2,000 mg by mouth daily.       aspirin  81 MG chewable tablet CHEW  AND SWALLOW 1 TABLET(81 MG) BY MOUTH DAILY 90 tablet 3   Cyanocobalamin (B-12 PO) Take 1 tablet by mouth daily.       cyclobenzaprine  (FLEXERIL ) 5 MG tablet Take 1 tablet (5 mg total) by mouth at bedtime as needed for muscle spasms. 30 tablet 1   desmopressin  (DDAVP  NASAL) 0.01 % solution USE ONE SPRAY IN EACH NOSTRIL FOUR TIMES DAILY 70 mL 3   desmopressin  (DDAVP ) 0.2 MG tablet Take 0.2 mg by mouth in the morning, at noon, and at bedtime.  fluticasone  (FLONASE ) 50 MCG/ACT nasal spray Place 1 spray into both nostrils daily. 16 g 0   hydrocortisone  (CORTEF ) 5 MG tablet Take 5 mg by mouth daily.       levothyroxine  (SYNTHROID ) 100 MCG tablet Take 100 mcg by mouth daily before breakfast.       LORazepam  (ATIVAN ) 2 MG tablet TAKE 3 TABLETS(6 MG) BY MOUTH AT BEDTIME 90 tablet 0   losartan  (COZAAR ) 25 MG tablet TAKE 1 TABLET(25 MG) BY MOUTH DAILY 90 tablet 3   Multiple Vitamin (MULTI VITAMIN) TABS Take 1 tablet by mouth daily.       potassium chloride  (KLOR-CON ) 10 MEQ tablet Take 2 tablets (20 mEq total) by mouth daily. (Patient taking differently: Take 10 mEq by mouth 2 (two) times daily.) 180 tablet 3   rosuvastatin  (CRESTOR ) 20 MG tablet TAKE 1 TABLET(20 MG) BY MOUTH DAILY 90 tablet 3   Testosterone  (ANDROGEL  PUMP) 20.25 MG/ACT (1.62%) GEL Place 20.25 mg onto the skin daily. Apply 6 pumps 3 pumps per side) topically to shoulders or upper back once a day 75 g 3   melatonin 3 MG TABS tablet Take 1 tablet (3 mg total) by mouth at bedtime. (Patient not taking: Reported on 10/03/2024) 90 tablet 3            Home: Home Living Family/patient expects to be discharged to:: Private residence Living Arrangements: Parent Available Help at Discharge: Available 24 hours/day Type of Home: House Home Access: Stairs to enter Secretary/administrator of Steps: 2 Entrance Stairs-Rails: None Home Layout: Two level, Bed/bath upstairs Alternate Level Stairs-Number of Steps: 14 Alternate Level Stairs-Rails:  Right Bathroom Shower/Tub: Engineer, Manufacturing Systems: Handicapped height Home Equipment: Agricultural Consultant (2 wheels), [employer] - single point  Lives With: Other (Comment) (parents)   Functional History: Prior Function Prior Level of Function : Needs assist Mobility Comments: typically walks without assist; can ambulate in community ADLs Comments: Father helps with his bath, able to dress, mother checks behind him, loves to play games (handheld system) loves bowling and [facility] [employer]   Functional Status:  Mobility: Bed Mobility Overal bed mobility: Needs Assistance Bed Mobility: Supine to Sit, Sit to Supine Supine to sit: Mod assist, HOB elevated Sit to supine: Mod assist, +2 for physical assistance, +2 for safety/equipment General bed mobility comments: cues/assist to initiate BLE to EOB, handheld assist to lift trunk with pt assisting to pull up. Mod A x 2 for truncal control and BLE back to bed. Transfers Overall transfer level: Needs assistance Equipment used: 2 person hand held assist Transfers: Sit to/from Stand Sit to Stand: Mod assist, +2 safety/equipment General transfer comment: Mod A to stand, +2 for safety to stand and once up, required up to Max A x 2 to maintain standing balance. Required manual assist to lift LLE for side steps along bedside Ambulation/Gait Ambulation/Gait assistance: Mod assist, +2 safety/equipment Gait Distance (Feet): 150 Feet Assistive device: Rolling walker (2 wheels), 1 person hand held assist Gait Pattern/deviations: Step-to pattern, Decreased stride length, Ataxic, Shuffle, Decreased dorsiflexion - left, Narrow base of support General Gait Details: gait deferred for today due to significant dynamic sitting and static standing balance deficits Gait velocity: decreased Gait velocity interpretation: <1.8 ft/sec, indicate of risk for recurrent falls   ADL: ADL Overall ADL's : Needs assistance/impaired Eating/Feeding: Moderate assistance, Bed  level Eating/Feeding Details (indicate cue type and reason): father feeding, but can usually complete himself Grooming: Minimal assistance, Sitting Upper Body Bathing: Minimal assistance, Sitting Lower Body  Bathing: Maximal assistance, Sitting/lateral leans, Sit to/from stand Upper Body Dressing : Minimal assistance, Sitting Lower Body Dressing: Maximal assistance, Sit to/from stand, Sitting/lateral leans Lower Body Dressing Details (indicate cue type and reason): Pt able to demo reaching down to B socks to pull up while sitting EOB. Assist to pull up L sock needed. Use of this strategy to correct posterior bias sitting EOB Toilet Transfer: Moderate assistance, Stand-pivot, BSC/3in1, Rolling walker (2 wheels) Toilet Transfer Details (indicate cue type and reason): simulated from bed Toileting- Clothing Manipulation and Hygiene: Moderate assistance, Sitting/lateral lean, Sit to/from stand Functional mobility during ADLs: Moderate assistance, +2 for physical assistance, +2 for safety/equipment, Cueing for safety, Cueing for sequencing, Rolling walker (2 wheels) General ADL Comments: Prior to this admission, patient living with his parents, able to ambulate without device, and able to complete dressing on his own. His father would assist with bathing for thoroughness. Currently, patient with need for mod A for ADL management, and mod A from an elevated surface to achieve standing. Patient with significant posterior lean in standing, therefore unable to attempt ambulation with only one person as support. Patient able to scoot to [facility] at supervision level, however requiring increased time and repetitions in order to be able to complete. Given prior level, and patient's 24/7 support, OT recommending intensive rehab >3 hours prior to discharge home. OT will continue to follow.   Cognition: Cognition Overall Cognitive Status: History of cognitive impairments - at baseline (dad feels it's worse but when  describing he refers more to his dysarthria that is worse) Arousal/Alertness: Awake/alert Orientation Level: Appropriate for developmental age Attention: Sustained Sustained Attention: Appears intact (for verbal but suspect difficulties during tasks) Memory: Impaired Memory Impairment: Storage deficit, Retrieval deficit (0/3 words, 2 correct with semantic cues, 1 incorrect with choice, recalled ate breakfast and took meds, not recall stroke with delay) Awareness: Impaired Awareness Impairment: Intellectual impairment Problem Solving: Impaired Problem Solving Impairment: Verbal basic (most didn't understand due to dysarthria) Safety/Judgment: Impaired Cognition Arousal: Alert Behavior During Therapy: Flat affect Overall Cognitive Status: History of cognitive impairments - at baseline (dad feels it's worse but when describing he refers more to his dysarthria that is worse)     Blood pressure (!) 115/92, pulse 97, temperature 99.9 F (37.7 C), temperature source Axillary, resp. rate 17, height 5' 8 (1.727 m), weight 89.9 kg, SpO2 97%. Physical Exam Vitals and nursing note reviewed. Exam conducted with a chaperone present.  Constitutional:      Appearance: He is normal weight.     Comments: Pt awake, but sleepy; appropriate, mouth open as plays video games on game machine, mother and father at bedside, NAD  HENT:     Head: Normocephalic and atraumatic.     Comments: Tongue to left slightly- cannot tell if facial droop- mouth held open entire time- cannot/won't smile for me    Right Ear: External ear normal.     Left Ear: External ear normal.     Nose: Nose normal. No congestion.     Mouth/Throat:     Mouth: Mucous membranes are dry.     Pharynx: No oropharyngeal exudate.  Eyes:     General:        Right eye: No discharge.        Left eye: No discharge.  Cardiovascular:     Rate and Rhythm: Regular rhythm. Tachycardia present.     Heart sounds: Normal heart sounds. No murmur  heard.    No gallop.  Pulmonary:  Comments: Quiet breath sounds- cannot get him to take deep breaths for me- however no obvious wheezes or rhonchi . However has thick cough- with nonproductive sound to it- and a few upper airways sounds Abdominal:     General: Bowel sounds are normal. There is no distension.     Palpations: Abdomen is soft.     Tenderness: There is no abdominal tenderness.  Musculoskeletal:     Cervical back: Neck supple.     Comments: RUE- 4+ to 5-/5 throughout RLE- 4+/5 throughout- stronger than expected LUE- biceps and triceps 5-/5; WE 2-/5 but somewhat contracted and posturing still; Grip 4-/5 and FA 2+/5 LLE- HF 4-/5; KE 4-/5; KF 3-/5; DF 2-/5 and PF 2-/5   Poaturing and contracture of L elbow and wrist- both in flexion at rest.      Skin:    General: Skin is warm and dry.     Comments: R forearm IV- looks OK  Neurological:     Mental Status: He is alert.     Comments: Answers basically that he's blessed, and fine- but no other wording used- is very dysarthric Somewhat Ataxic with arms as well B/L Cannot test sensation due to pt's inability to help with this  Psychiatric:     Comments: Flat- sleepy; focused on game       Lab Results Last 48 Hours        Results for orders placed or performed during the hospital encounter of 10/03/24 (from the past 48 hours)  Basic metabolic panel     Status: Abnormal    Collection Time: 10/08/24  2:50 PM  Result Value Ref Range    Sodium 144 135 - 145 mmol/L    Potassium 4.5 3.5 - 5.1 mmol/L    Chloride 112 (H) 98 - 111 mmol/L    CO2 22 22 - 32 mmol/L    Glucose, Bld 121 (H) 70 - 99 mg/dL      Comment: Glucose reference range applies only to samples taken after fasting for at least 8 hours.    BUN 14 6 - 20 mg/dL    Creatinine, Ser 8.80 0.61 - 1.24 mg/dL    Calcium  9.6 8.9 - 10.3 mg/dL    GFR, Estimated >39 >39 mL/min      Comment: (NOTE) Calculated using the CKD-EPI Creatinine Equation (2021)      Anion  gap 10 5 - 15      Comment: Performed at [facility], [address] [place], [place] [address]  Comprehensive metabolic panel with GFR     Status: Abnormal    Collection Time: 10/09/24  4:21 AM  Result Value Ref Range    Sodium 144 135 - 145 mmol/L    Potassium 4.5 3.5 - 5.1 mmol/L    Chloride 111 98 - 111 mmol/L    CO2 23 22 - 32 mmol/L    Glucose, Bld 109 (H) 70 - 99 mg/dL      Comment: Glucose reference range applies only to samples taken after fasting for at least 8 hours.    BUN 15 6 - 20 mg/dL    Creatinine, Ser 8.65 (H) 0.61 - 1.24 mg/dL    Calcium  9.1 8.9 - 10.3 mg/dL    Total Protein 6.7 6.5 - 8.1 g/dL    Albumin 3.5 3.5 - 5.0 g/dL    AST 34 15 - 41 U/L    ALT 40 0 - 44 U/L    Alkaline Phosphatase 54 38 -  126 U/L    Total Bilirubin 0.4 0.0 - 1.2 mg/dL    GFR, Estimated >39 >39 mL/min      Comment: (NOTE) Calculated using the CKD-EPI Creatinine Equation (2021)      Anion gap 10 5 - 15      Comment: Performed at [facility], [address], [place], [place] [address]  CBC     Status: Abnormal    Collection Time: 10/10/24  5:36 AM  Result Value Ref Range    WBC 10.6 (H) 4.0 - 10.5 K/uL    RBC 4.69 4.22 - 5.81 MIL/uL    Hemoglobin 14.0 13.0 - 17.0 g/dL    HCT 57.3 60.9 - 47.9 %    MCV 90.8 80.0 - 100.0 fL    MCH 29.9 26.0 - 34.0 pg    MCHC 32.9 30.0 - 36.0 g/dL    RDW 84.0 (H) 88.4 - 15.5 %    Platelets 195 150 - 400 K/uL    nRBC 0.0 0.0 - 0.2 %      Comment: Performed at [facility], [address], [place], [place] [address]  Magnesium     Status: None    Collection Time: 10/10/24  5:36 AM  Result Value Ref Range    Magnesium 1.9 1.7 - 2.4 mg/dL      Comment: Performed at [facility], [address], [place], [place] [address]  Basic metabolic panel with GFR     Status: Abnormal    Collection Time: 10/10/24  5:36 AM  Result Value Ref Range    Sodium 146 (H) 135 - 145 mmol/L    Potassium 3.9 3.5 - 5.1 mmol/L    Chloride 111 98 -  111 mmol/L    CO2 25 22 - 32 mmol/L    Glucose, Bld 98 70 - 99 mg/dL      Comment: Glucose reference range applies only to samples taken after fasting for at least 8 hours.    BUN 13 6 - 20 mg/dL    Creatinine, Ser 8.80 0.61 - 1.24 mg/dL    Calcium  9.2 8.9 - 10.3 mg/dL    GFR, Estimated >39 >39 mL/min      Comment: (NOTE) Calculated using the CKD-EPI Creatinine Equation (2021)      Anion gap 10 5 - 15      Comment: Performed at [facility], [address], [place], [place] [address]      Imaging Results (Last 48 hours)  No results found.         Blood pressure (!) 115/92, pulse 97, temperature 99.9 F (37.7 C), temperature source Axillary, resp. rate 17, height 5' 8 (1.727 m), weight 89.9 kg, SpO2 97%.   Medical Problem List and Plan: 1. Functional deficits secondary to L inferior basal ganglia stroke (posterior limb of internal capsule)             -patient may  shower             -ELOS/Goals: 14-18 days min A to mod I 2.  Antithrombotics: -DVT/anticoagulation:  Pharmaceutical: Lovenox              -antiplatelet therapy: DAPT X 3 weeks followed by Plavix  alone.  3. Pain Management: Tylenol  prn.  4. Mood/Behavior/Sleep: LCSW to follow for evaluation and support             --ativan  at nights for sleep/nightmares             -  antipsychotic agents: N/A 5. Neuropsych/cognition: This patient is not fully capable of making decisions on his own behalf. 6. Skin/Wound Care: Routine pressure relief measures.  7. Fluids/Electrolytes/Nutrition: Monitor I/O. Check CMET in am 8. Panhypopituitarism: On hydrocortisone , levothyroxine  and DDAVP -->followed by Dr. Faythe 9. Low grade fever:  CXR ordered for work up- if is negative, suggest U/A and Cx, since had 99.9 axillary temp last night 10.  HTN: Monitor BP tid--on Cozaar  11.  Hypernatremia: Sodium trending back up and DDAVP  increased to 2 mg TID today.              --recheck in am 12. Leucocytosis: WBC of 10.6- Monitor for fevers  and other signs of infection 13.  H/o Supersella germinoma: Hx of left hemiparesis, cognitive deficits and seizures in the past (no meds)  14. Mild OSA: Unable to tolerate CPAP 15. Mild constipation- pt goes daily at home- might need to intervene if hasn't gone by tomorrow.        Nicholas GORMAN Schmitz, PA-C 10/10/2024     I have personally performed a face to face diagnostic evaluation of this patient and formulated the key components of the plan.  Additionally, I have personally reviewed laboratory data, imaging studies, as well as relevant notes and concur with the physician assistant's documentation above.   The patient's status has not changed from the original H&P.  Any changes in documentation from the acute care chart have been noted above.

## 2024-10-10 NOTE — Progress Notes (Addendum)
 Inpatient Rehabilitation Admission Medication Review by a Pharmacist  A complete drug regimen review was completed for this patient to identify any potential clinically significant medication issues.  High Risk Drug Classes Is patient taking? Indication by Medication  Antipsychotic Yes, as an intravenous medication Compazine - N/V  Anticoagulant Yes Enoxaparin  - DVT prophylaxis  Antibiotic No   Opioid No   Antiplatelet Yes ASA/Plavix  for 3 weeks then plavix  alone - CVA  Hypoglycemics/insulin No   Vasoactive Medication Yes Losartan  - HTN  Chemotherapy No   Other Yes fleet enema , bisacodyl  , Senokot-S , and - constipation Maalox- indigestion Diphenhydramine- itching  Acetaminophen - pain  Robitussin- cough   Desmopressin , hydrocortisone  - panhypopituitarism Levothyroxine - hypothyroidism Lorazepam  - insomnia/nightmares Potassium - supplement Rosuvastatin  - HLD Cyclobenzaprine  - muscle spasm     Type of Medication Issue Identified Description of Issue Recommendation(s)  Drug Interaction(s) (clinically significant)     Duplicate Therapy     Allergy     No Medication Administration End Date     Incorrect Dose     Additional Drug Therapy Needed     Significant med changes from prior encounter (inform family/care partners about these prior to discharge). Testosterone  not re-ordered inpatient. Nasal DDAVP  prior to admit currently switched to PO DDAVP  Communicate relevant medication changes to patient/family members at discharge from CIR.   Restart or discontinue PTA meds not resumed in CIR at discharge if clinically indicated.   Other       Clinically significant medication issues were identified that warrant physician communication and completion of prescribed/recommended actions by midnight of the next day:  No  Name of provider notified for urgent issues identified:   Provider Method of Notification:     Pharmacist comments:   Time spent performing this drug  regimen review (minutes):  15   Larraine Brazier, PharmD Clinical Pharmacist 10/10/2024  8:59 PM **Pharmacist phone directory can now be found on [email or web address] ([email or web address]).  Listed under [facility].

## 2024-10-10 NOTE — Progress Notes (Signed)
 Lovorn, Megan, MD  Physician Physical Medicine and Rehabilitation   Consult Note    Signed   Date of Service: 10/07/2024  3:24 PM  Related encounter: ED to Hosp-Admission (Discharged) from 10/03/2024 in [place] [facility]   Signed     Expand All Collapse All  Show:Clear all [x] Written[x] Templated[] Copied  Added by: [x] Lovorn, Megan, MD  [] Hover for details          Physical Medicine and Rehabilitation Consult Reason for Consult:CIR Referring Physician: Dr Carlota     HPI: Nicholas Holt is a 43 y.o. male  with hx of brain CA with severe neuro issues and chronic L hemiparesis who was able to walk on his own at home with L AFO and no Assistive device. Also has remote hx of seizures, HTN, and chronic  low sodium and panhypopituitarism; OSA_ doesn't use CPAP- cannot tolerate.    He was admitted 11/21 with AMS and grabbing on wall to walk and dysarthria- they assumed due to change in DDVAP from nasal spray to oral form.  However found to have a L posterior limb internal capsule stroke- (L inferior BG stroke).  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  Pt Na and electrolytes were maximized, however has HbA1c of 6.0- and changes on ECHO.  They've attempted to treat him for cramps in his LEs- esp RLE, but both, which has had in past, but much worse currently than normal per mother.              Review of Systems  Unable to perform ROS: Mental acuity   cannot  obtain due to cognition     Past Medical History:  Diagnosis Date   Anxiety     Attention deficit disorder 08/08/2006   Bilateral leg cramps 11/25/2006   Blood transfusion without reported diagnosis 1997    Chemotherapy associated   Brain cancer (HCC) 1998   Cough 12/08/2015   COVID-19 10/16/2017   Elevated hemidiaphragm 07/09/2015    Left, presumably after portacath placement.    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  Complicated by left sided motor tic disorder and panhypopituitarism.    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  Includes central diabetes insipidus, secondary hypothyroidism, secondary adrenal insufficiency, and secondary hypogonadism.    Perennial allergic rhinitis 07/09/2015   Pre-diabetes      no meds   Preventative health care 07/09/2015   Seizure disorder (HCC) 08/08/2006    Predated brain tumor.  Generalized seizure ocurred after running out of lorazepam  post-tumor.    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 Treated with alendronate  from 2009-2016. DEXA (03/16/2008): L Spine T -2.0. L fem neck T -0.9, R fem neck T -1.9.  DEXA (10/15/2014): L Spine T -1.3, L fem neck T -0.6, R fem neck T -1.8.  Bisphosphonate holiday 06/2015.  Repeat DEXA scan in 10/2016 to reassess.    Weakness 09/15/2023  Past Surgical History:  Procedure Laterality Date   BRAIN BIOPSY   11/14/1995   CSF SHUNT   11/14/1995   PORTACATH PLACEMENT       portacath removal       RADIOLOGY WITH ANESTHESIA N/A 08/30/2021    Procedure: MRI WITH ANESTHESIA BRAIN WITH AND WITHOUT CONTRAST;  Surgeon: Radiologist, Medication, MD;  Location: [facility] OR;  Service: Radiology;  Laterality: N/A;   RADIOLOGY WITH ANESTHESIA N/A 05/25/2022    Procedure: MRI WITH ANESTHESIA OF BRAIN WITH AND WITHOUT CONTRAST;  Surgeon: Radiologist, Medication, MD;  Location: [facility] OR;  Service: Radiology;  Laterality: N/A;    RADIOLOGY WITH ANESTHESIA N/A 10/05/2024    Procedure: MRI WITH ANESTHESIA;  Surgeon: Radiologist, Medication, MD;  Location: [facility] OR;  Service: Radiology;  Laterality: N/A;             Family History  Problem Relation Age of Onset   Hypertension Mother     Hypertension Father     Hyperlipidemia Father     Prostate cancer Father     Diabetes type I Sister          Died of complications of diabetes in her 62's   Sleep apnea Maternal Aunt          Social History:  reports that he has never smoked. He has never been exposed to tobacco smoke. He has never used smokeless tobacco. He reports that he does not drink alcohol and does not use drugs. Allergies:  Allergies  No Known Allergies         Medications Prior to Admission  Medication Sig Dispense Refill   Apoaequorin (PREVAGEN) 10 MG CAPS Take 10 mg by mouth daily.       ascorbic acid (VITAMIN C) 1000 MG tablet Take 2,000 mg by mouth daily.       aspirin  81 MG chewable tablet CHEW AND SWALLOW 1 TABLET(81 MG) BY MOUTH DAILY 90 tablet 3   Cyanocobalamin (B-12 PO) Take 1 tablet by mouth daily.       cyclobenzaprine  (FLEXERIL ) 5 MG tablet Take 1 tablet (5 mg total) by mouth at bedtime as needed for muscle spasms. 30 tablet 1   desmopressin  (DDAVP  NASAL) 0.01 % solution USE ONE SPRAY IN EACH NOSTRIL FOUR TIMES DAILY 70 mL 3   desmopressin  (DDAVP ) 0.2 MG tablet Take 0.2 mg by mouth in the morning, at noon, and at bedtime.       fluticasone  (FLONASE ) 50 MCG/ACT nasal spray Place 1 spray into both nostrils daily. 16 g 0   hydrocortisone  (CORTEF ) 5 MG tablet Take 5 mg by mouth daily.       levothyroxine  (SYNTHROID ) 100 MCG tablet Take 100 mcg by mouth daily before breakfast.       LORazepam  (ATIVAN ) 2 MG tablet TAKE 3 TABLETS(6 MG) BY MOUTH AT BEDTIME 90 tablet 0   losartan  (COZAAR ) 25 MG tablet TAKE 1 TABLET(25 MG) BY MOUTH DAILY 90 tablet 3   Multiple Vitamin (MULTI VITAMIN) TABS Take 1 tablet by mouth daily.       potassium chloride   (KLOR-CON ) 10 MEQ tablet Take 2 tablets (20 mEq total) by mouth daily. (Patient taking differently: Take 10 mEq by mouth 2 (two) times daily.) 180 tablet 3   rosuvastatin  (CRESTOR ) 20 MG tablet TAKE 1 TABLET(20 MG) BY MOUTH DAILY 90 tablet 3   Testosterone  (ANDROGEL  PUMP) 20.25 MG/ACT (1.62%) GEL Place 20.25 mg onto the skin daily. Apply 6 pumps 3 pumps per side) topically to shoulders  or upper back once a day 75 g 3   melatonin 3 MG TABS tablet Take 1 tablet (3 mg total) by mouth at bedtime. (Patient not taking: Reported on 10/03/2024) 90 tablet 3          Home: Home Living Family/patient expects to be discharged to:: Private residence Living Arrangements: Parent Available Help at Discharge: Available 24 hours/day Type of Home: House Home Access: Stairs to enter Secretary/administrator of Steps: 2 Entrance Stairs-Rails: None Home Layout: Two level, Bed/bath upstairs Alternate Level Stairs-Number of Steps: 14 Alternate Level Stairs-Rails: Right Bathroom Shower/Tub: Engineer, Manufacturing Systems: Handicapped height Home Equipment: Agricultural Consultant (2 wheels), [employer] - single point  Functional History: Prior Function Prior Level of Function : Needs assist Mobility Comments: typically walks without assist; can ambulate in community ADLs Comments: Father helps with his bath, able to dress, mother checks behind him, loves to play games (handheld system) loves bowling and [facility] [employer] Functional Status:  Mobility: Bed Mobility Overal bed mobility: Needs Assistance Bed Mobility: Supine to Sit Supine to sit: Mod assist Sit to supine: Min assist, Used rails, HOB elevated General bed mobility comments: mod assist to clear legs and elevate trunk from surface with bed flat. Pt with posterior bias with continued mod assist for balance EOB Transfers Overall transfer level: Needs assistance Equipment used: Rolling walker (2 wheels) Transfers: Sit to/from Stand Sit to Stand: Mod assist General  transfer comment: mod assist to rise from elevated surface and repeated 5 trials from chair with progression to min assist. Pt needs multimodal cues and assist for anterior translation to rise to standing Ambulation/Gait Ambulation/Gait assistance: Mod assist, +2 safety/equipment Gait Distance (Feet): 150 Feet Assistive device: Rolling walker (2 wheels), 1 person hand held assist Gait Pattern/deviations: Step-to pattern, Decreased stride length, Ataxic, Shuffle, Decreased dorsiflexion - left, Narrow base of support General Gait Details: pt with Lt AFO donned in sitting and initiated gait with RW with pt grasping LUE with thumb and forefinger but demonstrated decreased control and safety with RW after 30' transitioned to RUE hand held assist. Pt toe walking on LLE with rushed gait needing mod assist for balance to prevent fall and max cues for safety and direction. Mom confirms pt normally toe walks but not as severely and was not rushing with gait PTA Gait velocity: decreased Gait velocity interpretation: <1.8 ft/sec, indicate of risk for recurrent falls   ADL: ADL Overall ADL's : Needs assistance/impaired Eating/Feeding: Moderate assistance, Bed level Eating/Feeding Details (indicate cue type and reason): father feeding, but can usually complete himself Grooming: Minimal assistance, Sitting Upper Body Bathing: Minimal assistance, Sitting Lower Body Bathing: Maximal assistance, Sitting/lateral leans, Sit to/from stand Upper Body Dressing : Minimal assistance, Sitting Lower Body Dressing: Maximal assistance, Sit to/from stand, Sitting/lateral leans Toilet Transfer: Moderate assistance, Stand-pivot, BSC/3in1, Rolling walker (2 wheels) Toilet Transfer Details (indicate cue type and reason): simulated from bed Toileting- Clothing Manipulation and Hygiene: Moderate assistance, Sitting/lateral lean, Sit to/from stand Functional mobility during ADLs: Moderate assistance, +2 for physical assistance,  +2 for safety/equipment, Cueing for safety, Cueing for sequencing, Rolling walker (2 wheels) General ADL Comments: Prior to this admission, patient living with his parents, able to ambulate without device, and able to complete dressing on his own. His father would assist with bathing for thoroughness. Currently, patient with need for mod A for ADL management, and mod A from an elevated surface to achieve standing. Patient with significant posterior lean in standing, therefore unable to attempt ambulation with  only one person as support. Patient able to scoot to [facility] at supervision level, however requiring increased time and repetitions in order to be able to complete. Given prior level, and patient's 24/7 support, OT recommending intensive rehab >3 hours prior to discharge home. OT will continue to follow.   Cognition: Cognition Orientation Level: Oriented X4 Cognition Arousal: Alert Behavior During Therapy: Flat affect   Blood pressure 117/79, pulse 78, temperature (!) 97.5 F (36.4 C), temperature source Oral, resp. rate 18, height 5' 8 (1.727 m), weight 89.9 kg, SpO2 100%. Physical Exam Vitals and nursing note reviewed. Exam conducted with a chaperone present.  Constitutional:      Appearance: He is obese.     Comments: Pt supine in bed; parents at bedside; kept closing his eyes during exam; answers everything with  ok and fine and denies pain, although obviously having spasms/pain, NAD  HENT:     Head: Normocephalic.     Comments:  Mouth open- unable to smile for me- and tingle appears to be on L      Right Ear: External ear normal.     Left Ear: External ear normal.     Nose: Nose normal. No congestion.     Mouth/Throat:     Mouth: Mucous membranes are dry.     Pharynx: Oropharynx is clear.  Eyes:     General:        Right eye: No discharge.        Left eye: No discharge.     Comments: Cannot test- pt kept closing his eyes- unless tickled intermittently- only during parts of  exam  Cardiovascular:     Rate and Rhythm: Normal rate and regular rhythm.     Heart sounds: Normal heart sounds. No murmur heard.    No gallop.  Pulmonary:     Effort: Pulmonary effort is normal. No respiratory distress.     Breath sounds: Normal breath sounds. No wheezing, rhonchi or rales.  Abdominal:     General: Bowel sounds are normal. There is no distension.     Palpations: Abdomen is soft.     Tenderness: There is no abdominal tenderness.  Musculoskeletal:     Cervical back: Neck supple.     Comments: RUE-  biceps/triceps 4/5; WE 4/5; Grip 4/5 and FA 2+/5 LUE- posturing constantly with LUE Biceps 5-/5; triceps 5-/5, WE 2-/5 but somewhat contracted; Grip 4-/5 and FA 2+/5 L wrist flexion contracture, but has more movement that expected Almost appears L elbow contracture, but then with encouragement got only lacking 20 degrees of Elbow extension RLE- 4+/5 except PF 4/5 LLE- HF 3+ to 4-/5; KE 4-/5 KF 3-/5 DF 2-/5 and PF 2-/5 Wears a fully encasing calf L AFO- not hinged   Skin:    General: Skin is warm and dry.     Comments: R forearm IV- looks OK  Neurological:     Mental Status: He is alert.     Comments: Pt kept repeating fine and OK-  Denies pain, even though had intermittent sweat on forehead when having a spasm/spasticity Toes contracted on LLE- with toe curling- can wiggle somewhat Hoffman's RUE and Clonus 3 beats on RLE- couldn't test on LUE/LLE due to tone/posturing MAS of 2 in LUE I think, but was not relaxing for testing (posturing as well) MAS of 2 in L hip/knee and ankle- again was fighting me so not sure No significant spasticity in RUE/RLE except hoffman's and clonus New dysarthria- very difficult to understand  except 1 word answers are somewhat better  Psychiatric:     Comments: Flat, quiet       Lab Results Last 24 Hours       Results for orders placed or performed during the hospital encounter of 10/03/24 (from the past 24 hours)  Comprehensive  metabolic panel     Status: None    Collection Time: 10/07/24  9:29 AM  Result Value Ref Range    Sodium 138 135 - 145 mmol/L    Potassium 4.9 3.5 - 5.1 mmol/L    Chloride 106 98 - 111 mmol/L    CO2 22 22 - 32 mmol/L    Glucose, Bld 78 70 - 99 mg/dL    BUN 12 6 - 20 mg/dL    Creatinine, Ser 8.91 0.61 - 1.24 mg/dL    Calcium  9.3 8.9 - 10.3 mg/dL    Total Protein 7.5 6.5 - 8.1 g/dL    Albumin 4.0 3.5 - 5.0 g/dL    AST 32 15 - 41 U/L    ALT 31 0 - 44 U/L    Alkaline Phosphatase 56 38 - 126 U/L    Total Bilirubin 0.5 0.0 - 1.2 mg/dL    GFR, Estimated >39 >39 mL/min    Anion gap 10 5 - 15       Imaging Results (Last 48 hours)  ECHOCARDIOGRAM COMPLETE Result Date: 10/06/2024    ECHOCARDIOGRAM REPORT   Patient Name:   YAMEN CASTROGIOVANNI Date of Exam: 10/06/2024 Medical Rec #:  [id number]        Height:       68.0 in Accession #:    [id number]       Weight:       198.2 lb Date of Birth:  [birth year]        BSA:          2.036 m Patient Age:    43 years         BP:           120/76 mmHg Patient Gender: M                HR:           86 bpm. Exam Location:  Inpatient Procedure: 2D Echo (Both Spectral and Color Flow Doppler were utilized during            procedure). Indications:    stroke  History:        Patient has prior history of Echocardiogram examinations, most                 recent 05/30/2022. Risk Factors:Hypertension, Dyslipidemia and                 Sleep Apnea.  Sonographer:    Tinnie Barefoot RDCS Sonographer#2:  Sherlean Dubin Referring Phys: [id number] ARY CUMMINS  Sonographer Comments: Technically difficult study due to poor echo windows. Image acquisition challenging due to respiratory motion and Image acquisition challenging due to uncooperative patient. IMPRESSIONS  1. Left ventricular ejection fraction, by estimation, is 60 to 65%. The left ventricle has normal function. The left ventricle has no regional wall motion abnormalities. Left ventricular diastolic parameters were normal.  2.  Right ventricular systolic function is normal. The right ventricular size is normal.  3. The mitral valve is normal in structure. No evidence of mitral valve regurgitation. No evidence of mitral stenosis.  4. The aortic valve is normal in structure. Aortic valve regurgitation is not visualized.  No aortic stenosis is present.  5. The inferior vena cava is normal in size with greater than 50% respiratory variability, suggesting right atrial pressure of 3 mmHg. FINDINGS  Left Ventricle: Left ventricular ejection fraction, by estimation, is 60 to 65%. The left ventricle has normal function. The left ventricle has no regional wall motion abnormalities. The left ventricular internal cavity size was normal in size. There is  no left ventricular hypertrophy. Left ventricular diastolic parameters were normal. Right Ventricle: The right ventricular size is normal. No increase in right ventricular wall thickness. Right ventricular systolic function is normal. Left Atrium: Left atrial size was normal in size. Right Atrium: Right atrial size was normal in size. Pericardium: There is no evidence of pericardial effusion. Mitral Valve: The mitral valve is normal in structure. No evidence of mitral valve regurgitation. No evidence of mitral valve stenosis. Tricuspid Valve: The tricuspid valve is normal in structure. Tricuspid valve regurgitation is not demonstrated. No evidence of tricuspid stenosis. Aortic Valve: The aortic valve is normal in structure. Aortic valve regurgitation is not visualized. No aortic stenosis is present. Pulmonic Valve: The pulmonic valve was normal in structure. Pulmonic valve regurgitation is not visualized. No evidence of pulmonic stenosis. Aorta: The aortic root is normal in size and structure. Venous: The inferior vena cava is normal in size with greater than 50% respiratory variability, suggesting right atrial pressure of 3 mmHg. IAS/Shunts: The interatrial septum was not well visualized.  LEFT  VENTRICLE PLAX 2D LVOT diam:     2.20 cm   Diastology LV SV:         76        LV e' medial:    6.85 cm/s LV SV Index:   37        LV E/e' medial:  11.6 LVOT Area:     3.80 cm  LV e' lateral:   10.70 cm/s LV IVRT:       99 msec   LV E/e' lateral: 7.4  RIGHT VENTRICLE RV Basal diam:  2.50 cm RV S prime:     12.60 cm/s TAPSE (M-mode): 1.5 cm LEFT ATRIUM             Index        RIGHT ATRIUM           Index LA Vol (A2C):   36.0 ml 17.68 ml/m  RA Area:     10.80 cm LA Vol (A4C):   25.6 ml 12.57 ml/m  RA Volume:   24.30 ml  11.94 ml/m LA Biplane Vol: 30.7 ml 15.08 ml/m  AORTIC VALVE LVOT Vmax:   116.00 cm/s LVOT Vmean:  77.100 cm/s LVOT VTI:    0.200 m  AORTA Ao Root diam: 2.90 cm MITRAL VALVE MV Area (PHT): 3.51 cm    SHUNTS MV Decel Time: 216 msec    Systemic VTI:  0.20 m MV E velocity: 79.30 cm/s  Systemic Diam: 2.20 cm MV A velocity: 95.20 cm/s MV E/A ratio:  0.83 Morene Brownie Electronically signed by Morene Brownie Signature Date/Time: 10/06/2024/1:06:25 PM    Final     CT ANGIO HEAD NECK W WO CM Result Date: 10/06/2024 EXAM: CT HEAD WITHOUT CTA HEAD AND NECK WITH AND WITHOUT 10/06/2024 01:26:36 AM TECHNIQUE: CTA of the head and neck was performed with and without the administration of 75 mL of iohexol  (OMNIPAQUE ) 350 MG/ML injection. Noncontrast CT of the head with reconstructed 2-D images are also provided for review. Multiplanar 2D and/or 3D reformatted images  are provided for review. Automated exposure control, iterative reconstruction, and/or weight based adjustment of the mA/kV was utilized to reduce the radiation dose to as low as reasonably achievable. COMPARISON: Head CT 10/03/2024 and brain MRI 10/05/2024. CLINICAL HISTORY: Stroke/TIA, determine embolic source. FINDINGS: CT HEAD: BRAIN AND VENTRICLES: Extensive mineralization within the thalami, left basal ganglia, and both cerebellar hemispheres consistent with Fahr disease. No acute intracranial hemorrhage. No mass effect. No midline  shift. No extra-axial fluid collection. No evidence of acute infarct. No hydrocephalus. ORBITS: No acute abnormality. SINUSES AND MASTOIDS: No acute abnormality. CTA NECK: AORTIC ARCH AND ARCH VESSELS: No dissection or arterial injury. No significant stenosis of the brachiocephalic or subclavian arteries. CERVICAL CAROTID ARTERIES: No dissection, arterial injury, or hemodynamically significant stenosis by NASCET criteria. CERVICAL VERTEBRAL ARTERIES: No dissection, arterial injury, or significant stenosis. LUNGS AND MEDIASTINUM: Unremarkable. SOFT TISSUES: No acute abnormality. BONES: No acute abnormality. CTA HEAD: ANTERIOR CIRCULATION: No significant stenosis of the internal carotid arteries. No significant stenosis of the anterior cerebral arteries. No significant stenosis of the middle cerebral arteries. No aneurysm. POSTERIOR CIRCULATION: No significant stenosis of the posterior cerebral arteries. No significant stenosis of the basilar artery. No significant stenosis of the vertebral arteries. No aneurysm. OTHER: No dural venous sinus thrombosis on this non-dedicated study. IMPRESSION: 1. No large vessel occlusion in the head or neck. 2. Extensive mineralization within the thalami, left basal ganglia, and both cerebellar hemispheres consistent with Fahr disease. Electronically signed by: Franky Stanford MD 10/06/2024 01:59 AM EST RP Workstation: [id number]         Assessment/Plan: Diagnosis: L Basal ganglia stroke with R hemiparesis and exacerbation of his ADLs/mobility after prior brain cancer in teens/spasticity Does the need for close, 24 hr/day medical supervision in concert with the patient's rehab needs make it unreasonable for this patient to be served in a less intensive setting? Yes Co-Morbidities requiring supervision/potential complications: brain CA with  associated spasticity- hasn't been treated in past; pan hypopituitarism; low Na chronic; HTN; seizure d/c; cognitive disability since brain  surgery, new dysarthria- hard ot understand Due to bladder management, bowel management, safety, disease management, medication administration, pain management, and patient education, does the patient require 24 hr/day rehab nursing? Yes Does the patient require coordinated care of a physician, rehab nurse, therapy disciplines of PT, OT and SLP to address physical and functional deficits in the context of the above medical diagnosis(es)? Yes Addressing deficits in the following areas: balance, endurance, locomotion, strength, transferring, bowel/bladder control, bathing, dressing, feeding, grooming, toileting, speech, and language Can the patient actively participate in an intensive therapy program of at least 3 hrs of therapy per day at least 5 days per week? Yes The potential for patient to make measurable gains while on inpatient rehab is good Anticipated functional outcomes upon discharge from inpatient rehab are modified independent and supervision  with PT, modified independent and supervision with OT, supervision with SLP mainly dysarthria- which is new. Estimated rehab length of stay to reach the above functional goals is: 2- 2.5 weeks Anticipated discharge destination: Home Overall Rehab/Functional Prognosis: good   RECOMMENDATIONS: This patient's condition is appropriate for continued rehabilitative care in the following setting: CIR Patient has agreed to participate in recommended program. Yes Note that insurance prior authorization may be required for reimbursement for recommended care.   Comment:  Pt has new spasticity/exacerbated since new stroke- it sounds like had mildly on L side, but now having new stroke has caused it to activate, already developing Sx's on R  side as well- Rehab is uniquely qualified to manage this because greatly impacting his ability to walk and complete ADLs- needed in acute rehab, since would not be able to care for himself/ADLs/mobility until it's improved.  Pt MIGHT benefit from PO meds (not baclofen), but could make him more sedated- will likely need Botulinum toxin Pt's parents are elderly and need him to be at a higher level of function.  Pt's labile hyponatremia from 120s-130s show he needs more oversight due to change DDVAP dosing.    Pt is mod A of 2 walking 150 ft, so this is not able to do in SNF setting- he needs to focus on quality of gait right now and to treat his worsening toe walking.   TO assist with pt's behavior at night- on Ativan , but probably will need titration in setting of new stroke.  Will d/w admissions coordinator to help pt get admitted to rehab Thank you for this consult     Duwaine Barrs, MD 10/07/2024      I spent a total of  71  minutes on total care today- >50% coordination of care- due to  D/w pt's parents and review of chart- multiple issues addressed esp his spasticity and education of parents about spasticity, what it is, how to treat, etc. And documentation took 24 minutes          Routing History

## 2024-10-10 NOTE — Progress Notes (Signed)
 PROGRESS NOTE  [name] FMW:[id number] DOB: 22-Dec-1980 DOA: 10/03/2024 PCP: Karna Fellows, MD   LOS: 7 days   Brief narrative:  Nicholas Holt is a 43 y.o. male with medical history significant for brain cancer when he was a teenager and now has subsequent severe neurologic disability was brought into the hospital due to gait instability and the need to hold onto the walls while walking.  He had wobbly gait though at baseline independent for ambulation.  He also had increased somnolence in 24 hours for show prior to presentation.  Of note patient had change in. DDAVP  was changed from spray to pills.  He had been on DDAVP  spray for more than 15 years. The mom first called his endocrinologist and they were both worried about his sodium level so the endocrinologist advised bringing him to the ED. patient was then admitted hospital for further evaluation and treatment    Assessment/Plan: Principal Problem:   Gait abnormality Active Problems:   Panhypopituitarism of anterior and posterior pituitary   Seizure disorder (HCC)   H/O malignant neoplasm of brain   Multiple lacunar infarcts (HCC)   OSA (obstructive sleep apnea)   Elevated CK  Acute CVA  /Acute gait instability  - MRI brain showed acute infarct in the posterior limb of the internal capsule on the left.  CTA head and neck without large vessel occlusion.  2D echocardiogram with LV ejection fraction of 60 to 65% with no regional wall motion abnormality.  Neurology was consulted and recommended dual antiplatelets for 3 weeks then Plavix  alone.  LDL less than 70 and hemoglobin A1c of 6.0.  Had dizziness so meclizine was started.  Physical therapy was consulted and has recommended CIR at this time.   Panhypopituitarism -patient is on hydrocortisone , levothyroxine  and DDAVP  as outpatient and follows with Endocrinology-Dr Kerr.  There was recent change of DDAVP  from spray to pills.  Sodium level of 138 on presentation.  Currently on 0.1  mg 3 times daily.  Sodium level on borderline high situation today at 146 show will increase the dosage to 0.2 mg 3 times daily.  Patient is negative balance for 7625 mL and had 800 mL urinary output in the last 24 hours..   Mild obstructive sleep apnea Was not able to tolerate CPAP despite being on Ativan .    Cognitive disability -on Ativan  at nighttime.  DVT prophylaxis: heparin  injection 5,000 Units Start: 10/05/24 2200   Disposition: CIR as per PT evaluation  Status is: Inpatient  Remains inpatient appropriate because: Need for CIR.    Code Status:     Code Status: Full Code  Family Communication: Spoke with the patient's mother at bedside  Consultants: Neurology Rehabilitation  Procedures: None  Anti-infectives:  None  Anti-infectives (From admission, onward)    None       Subjective: Today, patient was seen and examined at bedside.  Patient did not sleep well due to the need for urination in the nighttime otherwise denies complaints.  Sleeping at the time of exam.  Vitals:   10/10/24 0008 10/10/24 0825  BP: (!) 141/83 (!) 115/92  Pulse: 86 97  Resp: 18 17  Temp: 97.8 F (36.6 C) 99.9 F (37.7 C)  SpO2: 98% 97%    Intake/Output Summary (Last 24 hours) at 10/10/2024 0921 Last data filed at 10/09/2024 1743 Gross per 24 hour  Intake 10 ml  Output 800 ml  Net -790 ml   Filed Weights   10/03/24 1636 10/04/24 9348  Weight: 89.8 kg 89.9 kg   Body mass index is 30.14 kg/m.   Physical Exam: GENERAL: Patient is alert awake and Communicative.  Oriented 2-3, slow to respond, not in obvious distress. HENT: No scleral pallor or icterus. Pupils equally reactive to light. Oral mucosa is moist NECK: is supple, no gross swelling noted. CHEST: Clear to auscultation. No crackles or wheezes.  Diminished breath sounds bilaterally. CVS: S1 and S2 heard, no murmur. Regular rate and rhythm.  ABDOMEN: Soft, non-tender, bowel sounds are present. EXTREMITIES: No  edema. CNS: Cranial nerves are intact.  Left-sided weakness more than the right SKIN: warm and dry without rashes.  Data Review: I have personally reviewed the following laboratory data and studies,  CBC: Recent Labs  Lab 10/03/24 1724 10/04/24 0901 10/06/24 0926 10/10/24 0536  WBC 5.8 5.3 7.0 10.6*  NEUTROABS 3.3  --   --   --   HGB 13.1 12.9* 13.8 14.0  HCT 40.4 39.3 40.6 42.6  MCV 92.0 91.8 88.5 90.8  PLT 173 147* 165 195   Basic Metabolic Panel: Recent Labs  Lab 10/07/24 0929 10/08/24 0323 10/08/24 1450 10/09/24 0421 10/10/24 0536  NA 138 146* 144 144 146*  K 4.9 4.7 4.5 4.5 3.9  CL 106 114* 112* 111 111  CO2 22 22 22 23 25   GLUCOSE 78 107* 121* 109* 98  BUN 12 13 14 15 13   CREATININE 1.08 1.31* 1.19 1.34* 1.19  CALCIUM  9.3 9.5 9.6 9.1 9.2  MG  --   --   --   --  1.9   Liver Function Tests: Recent Labs  Lab 10/03/24 1724 10/06/24 0926 10/07/24 0929 10/08/24 0323 10/09/24 0421  AST 36 29 32 32 34  ALT 36 26 31 36 40  ALKPHOS 65 52 56 58 54  BILITOT 0.4 0.6 0.5 0.3 0.4  PROT 7.6 6.7 7.5 7.3 6.7  ALBUMIN 4.6 3.6 4.0 3.8 3.5   No results for input(s): LIPASE, AMYLASE in the last 168 hours. No results for input(s): AMMONIA in the last 168 hours. Cardiac Enzymes: No results for input(s): CKTOTAL, CKMB, CKMBINDEX, TROPONINI in the last 168 hours. BNP (last 3 results) No results for input(s): BNP in the last 8760 hours.  ProBNP (last 3 results) No results for input(s): PROBNP in the last 8760 hours.  CBG: Recent Labs  Lab 10/03/24 1630 10/05/24 1013  GLUCAP 95 91   No results found for this or any previous visit (from the past 240 hours).   Studies: No results found.    Vernal Alstrom, MD  [facility] 10/10/2024  If 7PM-7AM, please contact night-coverage

## 2024-10-10 NOTE — Discharge Summary (Signed)
 Physician Discharge Summary  Nicholas Holt FMW:[id number] DOB: 04/03/1981 DOA: 10/03/2024  PCP: Karna Fellows, MD  Admit date: 10/03/2024 Discharge date: 10/10/2024  Admitted From: Home  Discharge disposition: CIR   Recommendations for Outpatient Follow-Up:   Follow-up with primary care provider and endocrinology as outpatient  Discharge Diagnosis:   Principal Problem:   Gait abnormality Active Problems:   Panhypopituitarism of anterior and posterior pituitary   Seizure disorder (HCC)   H/O malignant neoplasm of brain   Multiple lacunar infarcts (HCC)   OSA (obstructive sleep apnea)   Elevated CK   Discharge Condition: Improved.  Diet recommendation:   Regular.  Wound care: None.  Code status: Full.   History of Present Illness:   Nicholas Holt is a 43 y.o. male with medical history significant for brain cancer when he was a teenager and now has subsequent severe neurologic disability was brought into the hospital due to gait instability and the need to hold onto the walls while walking.  He had wobbly gait though at baseline independent for ambulation.  He also had increased somnolence in 24 hours for show prior to presentation.  Of note patient had change in. DDAVP  was changed from spray to pills.  He had been on DDAVP  spray for more than 15 years. The mom first called his endocrinologist and they were both worried about his sodium level so the endocrinologist advised bringing him to the ED. patient was then admitted hospital for further evaluation and treatment     Hospital Course:   Following conditions were addressed during hospitalization as listed below,  Acute CVA  /Acute gait instability  - MRI brain showed acute infarct in the posterior limb of the internal capsule on the left.  CTA head and neck without large vessel occlusion.  2D echocardiogram with LV ejection fraction of 60 to 65% with no regional wall motion abnormality.  Neurology was consulted  and recommended dual antiplatelets for 3 weeks then Plavix  alone.  LDL less than 70 and hemoglobin A1c of 6.0.  Had dizziness so meclizine was started.  Physical therapy was consulted and has recommended CIR at this time.   Panhypopituitarism -patient is on hydrocortisone , levothyroxine  and DDAVP  as outpatient and follows with Endocrinology-Dr Kerr.  There was recent change of DDAVP  from spray to pills.  Sodium level of 138 on presentation.  Currently on 0.1 mg 3 times daily.  Sodium level on borderline high situation today at 146 s0 will increase the dosage to 0.2 mg 3 times daily-home dose.  Patient is negative balance for 7625 mL and had 800 mL urinary output in the last 24 hours.. Recommend follow-up with endocrinology after discharge.    Mild obstructive sleep apnea Was not able to tolerate CPAP despite being on Ativan .    Cognitive disability -on Ativan  at nighttime.  Disposition.  At this time, patient is stable for disposition to CIR.  Medical Consultants:   Neurology Rehabilitation  Procedures:    None Subjective:   Today, patient was seen and examined at bedside.  Patient did not sleep well due to the need for urination in the nighttime otherwise denies complaints.  Sleeping at the time of exam.   Discharge Exam:   Vitals:   10/10/24 0008 10/10/24 0825  BP: (!) 141/83 (!) 115/92  Pulse: 86 97  Resp: 18 17  Temp: 97.8 F (36.6 C) 99.9 F (37.7 C)  SpO2: 98% 97%   Vitals:   10/09/24 1630 10/09/24 2054 10/10/24 0008 10/10/24  0825  BP: 115/89 (!) 144/79 (!) 141/83 (!) 115/92  Pulse: 90 87 86 97  Resp: 17  18 17   Temp:   97.8 F (36.6 C) 99.9 F (37.7 C)  TempSrc: Oral Oral Skin Axillary  SpO2: 95% 100% 98% 97%  Weight:      Height:        GENERAL: Patient is alert awake and Communicative.  Oriented 2-3, slow to respond, not in obvious distress. HENT: No scleral pallor or icterus. Pupils equally reactive to light. Oral mucosa is moist NECK: is supple, no gross  swelling noted. CHEST: Clear to auscultation. No crackles or wheezes.  Diminished breath sounds bilaterally. CVS: S1 and S2 heard, no murmur. Regular rate and rhythm.  ABDOMEN: Soft, non-tender, bowel sounds are present. EXTREMITIES: No edema. CNS: Cranial nerves are intact.  Left-sided weakness more than the right SKIN: warm and dry without rashes.    The results of significant diagnostics from this hospitalization (including imaging, microbiology, ancillary and laboratory) are listed below for reference.     Diagnostic Studies:   CT Head Wo Contrast Result Date: 10/03/2024 EXAM: CT HEAD WITHOUT CONTRAST 10/03/2024 05:37:52 PM TECHNIQUE: CT of the head was performed without the administration of intravenous contrast. Automated exposure control, iterative reconstruction, and/or weight based adjustment of the mA/kV was utilized to reduce the radiation dose to as low as reasonably achievable. COMPARISON: None available. CLINICAL HISTORY: Neuro deficit, acute, stroke suspected FINDINGS: BRAIN AND VENTRICLES: No acute hemorrhage. No evidence of acute infarct. No hydrocephalus. No extra-axial collection. No mass effect or midline shift. Calcification within left greater than right basal ganglia. Bilateral cerebellar calcification. 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 Chronic encephalomalacia or lacunar infarcts are present in the inferior left basal ganglia. Encephalomalacia is present in the anterior right frontal lobe along the prior ventriculostomy catheter tract. Hypothalamic postoperative changes are noted. ORBITS: No acute abnormality. SINUSES: No acute abnormality. SOFT TISSUES AND SKULL: No acute soft tissue abnormality. Right frontal burr hole for prior ventriculostomy catheter noted. No skull fracture. IMPRESSION: 1. No acute intracranial abnormality. 2. Findings consistent with Fahr disease. 3. Chronic encephalomalacia or lacunar infarcts in the inferior left basal ganglia. 4. Right  frontal burr hole for prior ventriculostomy catheter with associated anterior right frontal lobe encephalomalacia. Electronically signed by: Lonni Necessary MD 10/03/2024 05:45 PM EST RP Workstation: [id number]     Labs:   Basic Metabolic Panel: Recent Labs  Lab 10/07/24 0929 10/08/24 0323 10/08/24 1450 10/09/24 0421 10/10/24 0536  NA 138 146* 144 144 146*  K 4.9 4.7 4.5 4.5 3.9  CL 106 114* 112* 111 111  CO2 22 22 22 23 25   GLUCOSE 78 107* 121* 109* 98  BUN 12 13 14 15 13   CREATININE 1.08 1.31* 1.19 1.34* 1.19  CALCIUM  9.3 9.5 9.6 9.1 9.2  MG  --   --   --   --  1.9   GFR Estimated Creatinine Clearance: 86.3 mL/min (by C-G formula based on SCr of 1.19 mg/dL). Liver Function Tests: Recent Labs  Lab 10/03/24 1724 10/06/24 0926 10/07/24 0929 10/08/24 0323 10/09/24 0421  AST 36 29 32 32 34  ALT 36 26 31 36 40  ALKPHOS 65 52 56 58 54  BILITOT 0.4 0.6 0.5 0.3 0.4  PROT 7.6 6.7 7.5 7.3 6.7  ALBUMIN 4.6 3.6 4.0 3.8 3.5   No results for input(s): LIPASE, AMYLASE in the last 168 hours. No results for input(s): AMMONIA in the last 168 hours. Coagulation profile No  results for input(s): INR, PROTIME in the last 168 hours.  CBC: Recent Labs  Lab 10/03/24 1724 10/04/24 0901 10/06/24 0926 10/10/24 0536  WBC 5.8 5.3 7.0 10.6*  NEUTROABS 3.3  --   --   --   HGB 13.1 12.9* 13.8 14.0  HCT 40.4 39.3 40.6 42.6  MCV 92.0 91.8 88.5 90.8  PLT 173 147* 165 195   Cardiac Enzymes: No results for input(s): CKTOTAL, CKMB, CKMBINDEX, TROPONINI in the last 168 hours. BNP: Invalid input(s): POCBNP CBG: Recent Labs  Lab 10/03/24 1630 10/05/24 1013  GLUCAP 95 91   D-Dimer No results for input(s): DDIMER in the last 72 hours. Hgb A1c No results for input(s): HGBA1C in the last 72 hours. Lipid Profile No results for input(s): CHOL, HDL, LDLCALC, TRIG, CHOLHDL, LDLDIRECT in the last 72 hours. Thyroid  function studies No results for  input(s): TSH, T4TOTAL, T3FREE, THYROIDAB in the last 72 hours.  Invalid input(s): FREET3 Anemia work up No results for input(s): VITAMINB12, FOLATE, FERRITIN, TIBC, IRON, RETICCTPCT in the last 72 hours. Microbiology No results found for this or any previous visit (from the past 240 hours).   Discharge Instructions:   Discharge Instructions     Diet general   Complete by: As directed    Discharge instructions   Complete by: As directed    Follow-up with your primary care provider and endocrinology as outpatient.   Increase activity slowly   Complete by: As directed       Allergies as of 10/10/2024   No Known Allergies      Medication List     STOP taking these medications    desmopressin  0.01 % solution Commonly known as: DDAVP  NASAL       TAKE these medications    ascorbic acid 1000 MG tablet Commonly known as: VITAMIN C Take 2,000 mg by mouth daily.   aspirin  81 MG chewable tablet CHEW AND SWALLOW 1 TABLET(81 MG) BY MOUTH DAILY   B-12 PO Take 1 tablet by mouth daily.   bisacodyl  5 MG EC tablet Commonly known as: DULCOLAX Take 1 tablet (5 mg total) by mouth daily as needed for moderate constipation.   clopidogrel  75 MG tablet Commonly known as: PLAVIX  Take 1 tablet (75 mg total) by mouth daily. Start taking on: October 11, 2024   cyclobenzaprine  5 MG tablet Commonly known as: FLEXERIL  Take 1 tablet (5 mg total) by mouth at bedtime as needed for muscle spasms.   desmopressin  0.2 MG tablet Commonly known as: DDAVP  Take 0.2 mg by mouth in the morning, at noon, and at bedtime.   fluticasone  50 MCG/ACT nasal spray Commonly known as: FLONASE  Place 1 spray into both nostrils daily.   hydrocortisone  5 MG tablet Commonly known as: CORTEF  Take 5 mg by mouth daily.   levothyroxine  100 MCG tablet Commonly known as: SYNTHROID  Take 100 mcg by mouth daily before breakfast.   LORazepam  2 MG tablet Commonly known as:  ATIVAN  TAKE 3 TABLETS(6 MG) BY MOUTH AT BEDTIME   losartan  25 MG tablet Commonly known as: COZAAR  TAKE 1 TABLET(25 MG) BY MOUTH DAILY   melatonin 3 MG Tabs tablet Take 1 tablet (3 mg total) by mouth at bedtime.   Multi Vitamin Tabs Take 1 tablet by mouth daily.   ondansetron  4 MG tablet Commonly known as: ZOFRAN  Take 1 tablet (4 mg total) by mouth every 6 (six) hours as needed for nausea.   potassium chloride  10 MEQ tablet Commonly known as: KLOR-CON  Take 2  tablets (20 mEq total) by mouth daily. What changed:  how much to take when to take this   Prevagen 10 MG Caps Generic drug: Apoaequorin Take 10 mg by mouth daily.   rosuvastatin  20 MG tablet Commonly known as: CRESTOR  TAKE 1 TABLET(20 MG) BY MOUTH DAILY   Testosterone  20.25 MG/ACT (1.62%) Gel Commonly known as: AndroGel  Pump Place 20.25 mg onto the skin daily. Apply 6 pumps 3 pumps per side) topically to shoulders or upper back once a day        Follow-up Information     Buck Saucer, MD. Go on 10/22/2024.   Specialties: Neurology, Radiology Contact information: [address] [place] [address] [phone]                  Time coordinating discharge: 39 minutes  Signed:  Vergie Zahm  [facility] 10/10/2024, 9:59 AM

## 2024-10-10 NOTE — Progress Notes (Signed)
 Occupational Therapy Treatment Patient Details Name: Nicholas Holt MRN: [id number] DOB: 01-Jun-1981 Today's Date: 10/10/2024   History of present illness 43 yo M adm 10/04/24 with impaired balance with posterior limb internal capsule infarct. PMH: suprasellar germinoma s/p chemo with left-sided motor tic disorder, HTN, HLD anxiety, ADD, ataxia, seizure disorder, OSA   OT comments  Per family, pt did not sleep much overnight so fatigued for AM OT session today but agreeable to attempt tasks. Pt with good initiation w/ sequencing cues. Pt noted with continued significant posterior bias when sitting EOB but able to correct w/ cues for reaching forward during LB ADLs. Pt requires +2 assist to stand and increased assist to maintain balance, advance LLE along bedside d/t weakness and ongoing posterior bias in standing. Family at bedside, hopeful for continued improvements with intensive rehab prior to DC home.      If plan is discharge home, recommend the following:  Two people to help with walking and/or transfers;A lot of help with bathing/dressing/bathroom;Assistance with cooking/housework;Direct supervision/assist for medications management;Direct supervision/assist for financial management;Assist for transportation;Help with stairs or ramp for entrance;Supervision due to cognitive status   Equipment Recommendations  Other (comment) (TBD)    Recommendations for Other Services Rehab consult    Precautions / Restrictions Precautions Precautions: Fall Recall of Precautions/Restrictions: Impaired Precaution/Restrictions Comments: Left AFO Restrictions Weight Bearing Restrictions Per Provider Order: No       Mobility Bed Mobility Overal bed mobility: Needs Assistance Bed Mobility: Supine to Sit, Sit to Supine     Supine to sit: Mod assist, HOB elevated Sit to supine: Mod assist, +2 for physical assistance, +2 for safety/equipment   General bed mobility comments: cues/assist to  initiate BLE to EOB, handheld assist to lift trunk with pt assisting to pull up. Mod A x 2 for truncal control and BLE back to bed.    Transfers Overall transfer level: Needs assistance Equipment used: 2 person hand held assist Transfers: Sit to/from Stand Sit to Stand: Mod assist, +2 safety/equipment           General transfer comment: Mod A to stand, +2 for safety to stand and once up, required up to Max A x 2 to maintain standing balance. Required manual assist to lift LLE for side steps along bedside     Balance Overall balance assessment: Needs assistance Sitting-balance support: Bilateral upper extremity supported, Feet supported Sitting balance-Leahy Scale: Poor   Postural control: Posterior lean Standing balance support: Bilateral upper extremity supported Standing balance-Leahy Scale: Poor                             ADL either performed or assessed with clinical judgement   ADL Overall ADL's : Needs assistance/impaired                 Upper Body Dressing : Minimal assistance;Sitting     Lower Body Dressing Details (indicate cue type and reason): Pt able to demo reaching down to B socks to pull up while sitting EOB. Assist to pull up L sock needed. Use of this strategy to correct posterior bias sitting EOB                    Extremity/Trunk Assessment Upper Extremity Assessment Upper Extremity Assessment: LUE deficits/detail LUE Deficits / Details: L hand contracted at wrist and with decreased strength from previous CVA post brain tumor per father LUE Coordination: decreased gross motor;decreased fine motor  Lower Extremity Assessment Lower Extremity Assessment: Defer to PT evaluation        Vision   Vision Assessment?: No apparent visual deficits;Wears glasses for reading   Perception     Praxis     Communication Communication Communication: Impaired Factors Affecting Communication: Reduced clarity of speech   Cognition  Arousal: Alert Behavior During Therapy: Flat affect Cognition: History of cognitive impairments             OT - Cognition Comments: Cognitvely impaired at baseline, patient's family reports he is at his baseline                 Following commands: Impaired Following commands impaired: Follows one step commands with increased time      Cueing   Cueing Techniques: Verbal cues, Gestural cues, Tactile cues  Exercises      Shoulder Instructions       General Comments      Pertinent Vitals/ Pain       Pain Assessment Pain Assessment: No/denies pain  Home Living                                          Prior Functioning/Environment              Frequency  Min 2X/week        Progress Toward Goals  OT Goals(current goals can now be found in the care plan section)  Progress towards OT goals: Progressing toward goals  Acute Rehab OT Goals Patient Stated Goal: family hopeful for pt to make continued progress OT Goal Formulation: With family Time For Goal Achievement: 10/20/24 Potential to Achieve Goals: Good ADL Goals Pt Will Perform Lower Body Bathing: with min assist;sitting/lateral leans;sit to/from stand Pt Will Perform Lower Body Dressing: with min assist;sitting/lateral leans;sit to/from stand Pt Will Transfer to Toilet: with min assist;stand pivot transfer;bedside commode Pt Will Perform Toileting - Clothing Manipulation and hygiene: with min assist;sitting/lateral leans;sit to/from stand Additional ADL Goal #1: Patient will be able to demonstrate increased awareness to be able to correct sitting or standing balance without cues. Additional ADL Goal #2: Patient will be able to complete functional task in standing for 3-5 minutes prior to needing seated rest break in order to improve overall activity tolerance.  Plan      Co-evaluation                 AM-PAC OT 6 Clicks Daily Activity     Outcome Measure   Help from  another person eating meals?: A Lot Help from another person taking care of personal grooming?: A Little Help from another person toileting, which includes using toliet, bedpan, or urinal?: A Lot Help from another person bathing (including washing, rinsing, drying)?: A Lot Help from another person to put on and taking off regular upper body clothing?: A Little Help from another person to put on and taking off regular lower body clothing?: A Lot 6 Click Score: 14    End of Session Equipment Utilized During Treatment: Gait belt  OT Visit Diagnosis: Unsteadiness on feet (R26.81);Other abnormalities of gait and mobility (R26.89);Muscle weakness (generalized) (M62.81);History of falling (Z91.81)   Activity Tolerance Patient tolerated treatment well;Patient limited by fatigue   Patient Left in bed;with call bell/phone within reach;with bed alarm set;with nursing/sitter in room   Nurse Communication Mobility status        Time:  9169-9142 OT Time Calculation (min): 27 min  Charges: OT General Charges $OT Visit: 1 Visit OT Treatments $Therapeutic Activity: 23-37 mins  Mliss NOVAK, OTR/L Acute Rehab Services Office: [phone]   Mliss Fish 10/10/2024, 10:06 AM

## 2024-10-10 NOTE — H&P (Signed)
 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 Family reported recent change of DDAVP  from intranasal to oral route and was advised to present to ED by endocrinology due to concerns of sodium level which was 129 at admission. CT head negative for acute changes and showed right burr hole from prior ventric with anterior right frontal encephalomalacia and chronic encephalomalacia left basal ganglia with finding c/w Fahr disease. Neurology consulted and MRI brain done revealing 8 mm acute infarct in posterior limb left internal capsule. CTA brain was negative for LVO. Dr. Jerri felt that stroke likely related to small vessel radiation related vasculopathy and recommended DAPT X 3 week followed by Plavix  alone.   He was place on fluid restriction briefly but Dr. Faythe advised against this and DDAVP  dose  briefly reduced to  1 mg tid. Sodium has started trending up and DDAVP  increased to 2 mg TID today.  Reported to have poor sleep last night due to attempts to use male purewick as well as low grade fever  T- 99.9 this am   PT/OT has been working with patient who continues to be limited by left sided weakness with dysarthria, has significant posterior bias and right lateral lean needing multimodal cues to self correct with activity, has decreased insight with poor awareness of deficits and is working on pre-gait activity.  PTA: He was independent without AD. Father assisted with ADL. Mother sets out meds. CIR recommended due to functional decline.   Pt's mother reports pt has a runny nose and a mild cough- since his radiation and chemo,  he has difficulty coughing up phlegm when sick.  She also reports he had a low grade 99.9 axillary temp last night- and this AM was 99.1 by 10:30am.  Pt also more sleepy, but didn't rest well= upset about purewick they attempted to use last night.  He usually gets up to bathroom to void/have BM at home.  LBM 2 days ago- and dad hurt his back taking pt to bathroom at that time.     Review of Systems  Unable to perform ROS: Mental acuity  All other systems reviewed and are negative.    Past Medical History:  Diagnosis Date   Anxiety    Attention deficit disorder 08/08/2006   Bilateral leg cramps 11/25/2006   Blood transfusion without reported diagnosis 1997   Chemotherapy associated   Brain cancer (HCC) 1998   Cough 12/08/2015   COVID-19 10/16/2017   Elevated hemidiaphragm 07/09/2015   Left, presumably after portacath placement.    Fall 04/13/2022   H/O malignant neoplasm of brain 09/22/2014   Suprasellar germinoma treated at [facility] in 1997; received chemotherapy (cisplatin, etoposide, vincristine, and cyclophosphomide) and radiation therapy.  Complicated by left sided motor tic disorder and panhypopituitarism.    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  Includes central diabetes insipidus, secondary hypothyroidism, secondary adrenal insufficiency, and secondary hypogonadism.    Perennial allergic rhinitis 07/09/2015   Pre-diabetes    no meds   Preventative health care 07/09/2015   Seizure disorder (HCC) 08/08/2006   Predated brain tumor.  Generalized seizure ocurred after running out of lorazepam  post-tumor.    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 Treated with alendronate  from 2009-2016. DEXA (03/16/2008): L Spine T -2.0. L fem neck T -0.9, R fem neck T -1.9.  DEXA (10/15/2014): L Spine T -1.3, L fem neck T -0.6, R fem neck T -1.8.  Bisphosphonate holiday 06/2015.  Repeat DEXA scan in 10/2016 to reassess.    Weakness 09/15/2023    Past Surgical History:  Procedure Laterality Date   BRAIN BIOPSY  11/14/1995   CSF SHUNT  11/14/1995   PORTACATH PLACEMENT     portacath removal     RADIOLOGY WITH ANESTHESIA N/A 08/30/2021   Procedure: MRI WITH ANESTHESIA BRAIN WITH AND WITHOUT CONTRAST;  Surgeon: Radiologist, Medication, MD;  Location: [facility] OR;  Service: Radiology;  Laterality: N/A;   RADIOLOGY WITH ANESTHESIA N/A 05/25/2022   Procedure: MRI WITH ANESTHESIA OF BRAIN WITH AND WITHOUT CONTRAST;  Surgeon: Radiologist, Medication, MD;  Location: [facility] OR;  Service: Radiology;  Laterality: N/A;   RADIOLOGY WITH ANESTHESIA N/A 10/05/2024   Procedure: MRI WITH ANESTHESIA;  Surgeon: Radiologist, Medication, MD;  Location: [facility] OR;  Service: Radiology;  Laterality: N/A;    Family History  Problem Relation Age of Onset   Hypertension Mother    Hypertension Father    Hyperlipidemia Father    Prostate cancer Father    Diabetes type I Sister        Died of complications of diabetes in her 66's   Sleep apnea Maternal Aunt     Social History:  reports that he has never smoked. He has never been exposed to tobacco smoke. He has never used smokeless tobacco. He reports that he does not drink alcohol and does not use drugs.   Allergies: No Known Allergies   Medications Prior to Admission  Medication Sig Dispense Refill   Apoaequorin (PREVAGEN) 10 MG CAPS Take 10 mg by mouth daily.     ascorbic acid (VITAMIN C) 1000 MG tablet Take 2,000 mg by mouth daily.     aspirin  81 MG chewable tablet CHEW AND SWALLOW 1 TABLET(81 MG) BY MOUTH DAILY 90 tablet 3   Cyanocobalamin (B-12 PO) Take 1 tablet by mouth daily.     cyclobenzaprine   (FLEXERIL ) 5 MG tablet Take 1 tablet (5 mg total) by mouth at bedtime as needed for muscle spasms. 30 tablet 1   desmopressin  (DDAVP  NASAL) 0.01 % solution USE ONE SPRAY IN EACH NOSTRIL FOUR TIMES DAILY 70 mL 3   desmopressin  (DDAVP ) 0.2 MG tablet Take 0.2 mg by mouth in the morning, at noon, and at bedtime.     fluticasone  (FLONASE ) 50 MCG/ACT nasal spray Place 1 spray into both nostrils daily. 16 g 0   hydrocortisone  (CORTEF ) 5 MG tablet Take 5 mg by mouth daily.     levothyroxine  (SYNTHROID ) 100 MCG tablet Take 100 mcg by mouth daily before breakfast.     LORazepam  (ATIVAN ) 2 MG tablet TAKE 3 TABLETS(6 MG) BY MOUTH AT BEDTIME 90 tablet 0   losartan  (COZAAR ) 25 MG tablet TAKE 1 TABLET(25 MG) BY MOUTH DAILY 90 tablet 3   Multiple Vitamin (MULTI VITAMIN) TABS Take 1 tablet by mouth daily.  potassium chloride  (KLOR-CON ) 10 MEQ tablet Take 2 tablets (20 mEq total) by mouth daily. (Patient taking differently: Take 10 mEq by mouth 2 (two) times daily.) 180 tablet 3   rosuvastatin  (CRESTOR ) 20 MG tablet TAKE 1 TABLET(20 MG) BY MOUTH DAILY 90 tablet 3   Testosterone  (ANDROGEL  PUMP) 20.25 MG/ACT (1.62%) GEL Place 20.25 mg onto the skin daily. Apply 6 pumps 3 pumps per side) topically to shoulders or upper back once a day 75 g 3   melatonin 3 MG TABS tablet Take 1 tablet (3 mg total) by mouth at bedtime. (Patient not taking: Reported on 10/03/2024) 90 tablet 3     Home: Home Living Family/patient expects to be discharged to:: Private residence Living Arrangements: Parent Available Help at Discharge: Available 24 hours/day Type of Home: House Home Access: Stairs to enter Secretary/administrator of Steps: 2 Entrance Stairs-Rails: None Home Layout: Two level, Bed/bath upstairs Alternate Level Stairs-Number of Steps: 14 Alternate Level Stairs-Rails: Right Bathroom Shower/Tub: Engineer, Manufacturing Systems: Handicapped height Home Equipment: Agricultural Consultant (2 wheels), [employer] - single point  Lives  With: Other (Comment) (parents)   Functional History: Prior Function Prior Level of Function : Needs assist Mobility Comments: typically walks without assist; can ambulate in community ADLs Comments: Father helps with his bath, able to dress, mother checks behind him, loves to play games (handheld system) loves bowling and [facility] [employer]  Functional Status:  Mobility: Bed Mobility Overal bed mobility: Needs Assistance Bed Mobility: Supine to Sit, Sit to Supine Supine to sit: Mod assist, HOB elevated Sit to supine: Mod assist, +2 for physical assistance, +2 for safety/equipment General bed mobility comments: cues/assist to initiate BLE to EOB, handheld assist to lift trunk with pt assisting to pull up. Mod A x 2 for truncal control and BLE back to bed. Transfers Overall transfer level: Needs assistance Equipment used: 2 person hand held assist Transfers: Sit to/from Stand Sit to Stand: Mod assist, +2 safety/equipment General transfer comment: Mod A to stand, +2 for safety to stand and once up, required up to Max A x 2 to maintain standing balance. Required manual assist to lift LLE for side steps along bedside Ambulation/Gait Ambulation/Gait assistance: Mod assist, +2 safety/equipment Gait Distance (Feet): 150 Feet Assistive device: Rolling walker (2 wheels), 1 person hand held assist Gait Pattern/deviations: Step-to pattern, Decreased stride length, Ataxic, Shuffle, Decreased dorsiflexion - left, Narrow base of support General Gait Details: gait deferred for today due to significant dynamic sitting and static standing balance deficits Gait velocity: decreased Gait velocity interpretation: <1.8 ft/sec, indicate of risk for recurrent falls    ADL: ADL Overall ADL's : Needs assistance/impaired Eating/Feeding: Moderate assistance, Bed level Eating/Feeding Details (indicate cue type and reason): father feeding, but can usually complete himself Grooming: Minimal assistance,  Sitting Upper Body Bathing: Minimal assistance, Sitting Lower Body Bathing: Maximal assistance, Sitting/lateral leans, Sit to/from stand Upper Body Dressing : Minimal assistance, Sitting Lower Body Dressing: Maximal assistance, Sit to/from stand, Sitting/lateral leans Lower Body Dressing Details (indicate cue type and reason): Pt able to demo reaching down to B socks to pull up while sitting EOB. Assist to pull up L sock needed. Use of this strategy to correct posterior bias sitting EOB Toilet Transfer: Moderate assistance, Stand-pivot, BSC/3in1, Rolling walker (2 wheels) Toilet Transfer Details (indicate cue type and reason): simulated from bed Toileting- Clothing Manipulation and Hygiene: Moderate assistance, Sitting/lateral lean, Sit to/from stand Functional mobility during ADLs: Moderate assistance, +2 for physical assistance, +2 for safety/equipment, Cueing  for safety, Cueing for sequencing, Rolling walker (2 wheels) General ADL Comments: Prior to this admission, patient living with his parents, able to ambulate without device, and able to complete dressing on his own. His father would assist with bathing for thoroughness. Currently, patient with need for mod A for ADL management, and mod A from an elevated surface to achieve standing. Patient with significant posterior lean in standing, therefore unable to attempt ambulation with only one person as support. Patient able to scoot to [facility] at supervision level, however requiring increased time and repetitions in order to be able to complete. Given prior level, and patient's 24/7 support, OT recommending intensive rehab >3 hours prior to discharge home. OT will continue to follow.  Cognition: Cognition Overall Cognitive Status: History of cognitive impairments - at baseline (dad feels it's worse but when describing he refers more to his dysarthria that is worse) Arousal/Alertness: Awake/alert Orientation Level: Appropriate for developmental  age Attention: Sustained Sustained Attention: Appears intact (for verbal but suspect difficulties during tasks) Memory: Impaired Memory Impairment: Storage deficit, Retrieval deficit (0/3 words, 2 correct with semantic cues, 1 incorrect with choice, recalled ate breakfast and took meds, not recall stroke with delay) Awareness: Impaired Awareness Impairment: Intellectual impairment Problem Solving: Impaired Problem Solving Impairment: Verbal basic (most didn't understand due to dysarthria) Safety/Judgment: Impaired Cognition Arousal: Alert Behavior During Therapy: Flat affect Overall Cognitive Status: History of cognitive impairments - at baseline (dad feels it's worse but when describing he refers more to his dysarthria that is worse)   Blood pressure (!) 115/92, pulse 97, temperature 99.9 F (37.7 C), temperature source Axillary, resp. rate 17, height 5' 8 (1.727 m), weight 89.9 kg, SpO2 97%. Physical Exam Vitals and nursing note reviewed. Exam conducted with a chaperone present.  Constitutional:      Appearance: He is normal weight.     Comments: Pt awake, but sleepy; appropriate, mouth open as plays video games on game machine, mother and father at bedside, NAD  HENT:     Head: Normocephalic and atraumatic.     Comments: Tongue to left slightly- cannot tell if facial droop- mouth held open entire time- cannot/won't smile for me    Right Ear: External ear normal.     Left Ear: External ear normal.     Nose: Nose normal. No congestion.     Mouth/Throat:     Mouth: Mucous membranes are dry.     Pharynx: No oropharyngeal exudate.  Eyes:     General:        Right eye: No discharge.        Left eye: No discharge.  Cardiovascular:     Rate and Rhythm: Regular rhythm. Tachycardia present.     Heart sounds: Normal heart sounds. No murmur heard.    No gallop.  Pulmonary:     Comments: Quiet breath sounds- cannot get him to take deep breaths for me- however no obvious wheezes or  rhonchi . However has thick cough- with nonproductive sound to it- and a few upper airways sounds Abdominal:     General: Bowel sounds are normal. There is no distension.     Palpations: Abdomen is soft.     Tenderness: There is no abdominal tenderness.  Musculoskeletal:     Cervical back: Neck supple.     Comments: RUE- 4+ to 5-/5 throughout RLE- 4+/5 throughout- stronger than expected LUE- biceps and triceps 5-/5; WE 2-/5 but somewhat contracted and posturing still; Grip 4-/5 and FA 2+/5 LLE- HF  4-/5; KE 4-/5; KF 3-/5; DF 2-/5 and PF 2-/5  Poaturing and contracture of L elbow and wrist- both in flexion at rest.     Skin:    General: Skin is warm and dry.     Comments: R forearm IV- looks OK  Neurological:     Mental Status: He is alert.     Comments: Answers basically that he's blessed, and fine- but no other wording used- is very dysarthric Somewhat Ataxic with arms as well B/L Cannot test sensation due to pt's inability to help with this  Psychiatric:     Comments: Flat- sleepy; focused on game     Results for orders placed or performed during the hospital encounter of 10/03/24 (from the past 48 hours)  Basic metabolic panel     Status: Abnormal   Collection Time: 10/08/24  2:50 PM  Result Value Ref Range   Sodium 144 135 - 145 mmol/L   Potassium 4.5 3.5 - 5.1 mmol/L   Chloride 112 (H) 98 - 111 mmol/L   CO2 22 22 - 32 mmol/L   Glucose, Bld 121 (H) 70 - 99 mg/dL    Comment: Glucose reference range applies only to samples taken after fasting for at least 8 hours.   BUN 14 6 - 20 mg/dL   Creatinine, Ser 8.80 0.61 - 1.24 mg/dL   Calcium  9.6 8.9 - 10.3 mg/dL   GFR, Estimated >39 >39 mL/min    Comment: (NOTE) Calculated using the CKD-EPI Creatinine Equation (2021)    Anion gap 10 5 - 15    Comment: Performed at [facility], [address] [place], [place] [address]  Comprehensive metabolic panel with GFR     Status: Abnormal   Collection Time: 10/09/24  4:21 AM   Result Value Ref Range   Sodium 144 135 - 145 mmol/L   Potassium 4.5 3.5 - 5.1 mmol/L   Chloride 111 98 - 111 mmol/L   CO2 23 22 - 32 mmol/L   Glucose, Bld 109 (H) 70 - 99 mg/dL    Comment: Glucose reference range applies only to samples taken after fasting for at least 8 hours.   BUN 15 6 - 20 mg/dL   Creatinine, Ser 8.65 (H) 0.61 - 1.24 mg/dL   Calcium  9.1 8.9 - 10.3 mg/dL   Total Protein 6.7 6.5 - 8.1 g/dL   Albumin 3.5 3.5 - 5.0 g/dL   AST 34 15 - 41 U/L   ALT 40 0 - 44 U/L   Alkaline Phosphatase 54 38 - 126 U/L   Total Bilirubin 0.4 0.0 - 1.2 mg/dL   GFR, Estimated >39 >39 mL/min    Comment: (NOTE) Calculated using the CKD-EPI Creatinine Equation (2021)    Anion gap 10 5 - 15    Comment: Performed at [facility], [address] [place], [place] [address]  CBC     Status: Abnormal   Collection Time: 10/10/24  5:36 AM  Result Value Ref Range   WBC 10.6 (H) 4.0 - 10.5 K/uL   RBC 4.69 4.22 - 5.81 MIL/uL   Hemoglobin 14.0 13.0 - 17.0 g/dL   HCT 57.3 60.9 - 47.9 %   MCV 90.8 80.0 - 100.0 fL   MCH 29.9 26.0 - 34.0 pg   MCHC 32.9 30.0 - 36.0 g/dL   RDW 84.0 (H) 88.4 - 84.4 %   Platelets 195 150 - 400 K/uL   nRBC 0.0 0.0 - 0.2 %  Comment: Performed at [facility], [address] [place], [place] [address]  Magnesium      Status: None   Collection Time: 10/10/24  5:36 AM  Result Value Ref Range   Magnesium  1.9 1.7 - 2.4 mg/dL    Comment: Performed at [facility], [address] [place], [place] [address]  Basic metabolic panel with GFR     Status: Abnormal   Collection Time: 10/10/24  5:36 AM  Result Value Ref Range   Sodium 146 (H) 135 - 145 mmol/L   Potassium 3.9 3.5 - 5.1 mmol/L   Chloride 111 98 - 111 mmol/L   CO2 25 22 - 32 mmol/L   Glucose, Bld 98 70 - 99 mg/dL    Comment: Glucose reference range applies only to samples taken after fasting for at least 8 hours.   BUN 13 6 - 20 mg/dL   Creatinine, Ser 8.80 0.61 - 1.24 mg/dL   Calcium  9.2  8.9 - 10.3 mg/dL   GFR, Estimated >39 >39 mL/min    Comment: (NOTE) Calculated using the CKD-EPI Creatinine Equation (2021)    Anion gap 10 5 - 15    Comment: Performed at [facility], [address] [place], [place] [address]   No results found.    Blood pressure (!) 115/92, pulse 97, temperature 99.9 F (37.7 C), temperature source Axillary, resp. rate 17, height 5' 8 (1.727 m), weight 89.9 kg, SpO2 97%.  Medical Problem List and Plan: 1. Functional deficits secondary to L inferior basal ganglia stroke (posterior limb of internal capsule)  -patient may  shower  -ELOS/Goals: 14-18 days min A to mod I 2.  Antithrombotics: -DVT/anticoagulation:  Pharmaceutical: Lovenox   -antiplatelet therapy: DAPT X 3 weeks followed by Plavix  alone.  3. Pain Management: Tylenol  prn.  4. Mood/Behavior/Sleep: LCSW to follow for evaluation and support  --ativan  at nights for sleep/nightmares  -antipsychotic agents: N/A 5. Neuropsych/cognition: This patient is not fully capable of making decisions on his own behalf. 6. Skin/Wound Care: Routine pressure relief measures.  7. Fluids/Electrolytes/Nutrition: Monitor I/O. Check CMET in am 8. Panhypopituitarism: On hydrocortisone , levothyroxine  and DDAVP -->followed by Dr. Faythe 9. Low grade fever:  CXR ordered for work up- if is negative, suggest U/A and Cx, since had 99.9 axillary temp last night 10.  HTN: Monitor BP tid--on Cozaar  11.  Hypernatremia: Sodium trending back up and DDAVP  increased to 2 mg TID today.   --recheck in am 12. Leucocytosis: WBC of 10.6- Monitor for fevers and other signs of infection 13.  H/o Supersella germinoma: Hx of left hemiparesis, cognitive deficits and seizures in the past (no meds)  14. Mild OSA: Unable to tolerate CPAP 15. Mild constipation- pt goes daily at home- might need to intervene if hasn't gone by tomorrow.     Sharlet GORMAN Schmitz, PA-C 10/10/2024   I have personally performed a face to face diagnostic  evaluation of this patient and formulated the key components of the plan.  Additionally, I have personally reviewed laboratory data, imaging studies, as well as relevant notes and concur with the physician assistant's documentation above.   The patient's status has not changed from the original H&P.  Any changes in documentation from the acute care chart have been noted above.

## 2024-10-10 NOTE — Progress Notes (Signed)
   Inpatient Rehabilitation Admissions Coordinator   Met with patient and parents at bedside. They are in agreement to CIR admit today. Acute team and TOC made aware. I will make the arrangements.  Heron Leavell, RN, MSN Rehab Admissions Coordinator [phone] 10/10/2024 10:36 AM

## 2024-10-10 NOTE — Progress Notes (Signed)
 Cornelio Bouchard, MD  Physician Physical Medicine and Rehabilitation   PMR Pre-admission    Signed   Date of Service: 10/08/2024 12:43 PM  Related encounter: ED to Hosp-Admission (Discharged) from 10/03/2024 in [place] [facility]   Signed     Expand All Collapse All  Show:Clear all [x] Written[x] Templated[x] Copied  Added by: [x] Sonal Dorwart, Heron MATSU, RN[x] Lovorn, Megan, MD[x] Warren, Caitlin E, PT  [] Hover for details PMR Admission Coordinator Pre-Admission Assessment   Patient: Nicholas Holt is an 43 y.o., male MRN: [id number] DOB: February 07, 1981 Height: 5' 8 (1.727 m) Weight: 89.9 kg   Insurance Information HMO: yes    PPO:      PCP:      IPA:      80/20:      OTHER:  PRIMARY: [employer] Dual Complete      Policy#: [id number]      Subscriber: pt CM Name: Corean      Phone#: [phone] option 3     Fax#: [phone] Pre-Cert#: [id number]  auth for CIR from [place] with [facility] for [facility] medicare for admit 10/08/24 with next review date 10/14/24.  Updates due to general fax at fax listed above.        Employer:  Benefits:  Phone #: [phone]     Name:  Eff. Date: 02/12/24     Deduct: $257 (met)      Out of Pocket Max: [phone] ($736.14 met)      Life Max: n/a CIR: $1565/admit      SNF: 20 full days Outpatient: 80%     Co-Pay: 20% Home Health: 100%      Co-Pay:  DME: 80%     Co-Pay: 20% Providers:  SECONDARY: Medicaid of [place]      Policy#: [id number]       Phone#: [phone]   Financial Counselor:       Phone#:    The "Data Collection Information Summary" for patients in Inpatient Rehabilitation Facilities with attached "Privacy Act Statement-Health Care Records" was provided and verbally reviewed with: Patient and Family   Emergency Contact Information Contact Information       Name Relation Home Work Mobile    [place] A Father [phone]        [place] Mother [phone]             Other Contacts   None on  File        Current Medical History  Patient Admitting Diagnosis: L PLIC CVA   History of Present Illness: Pt is a 43 y/o male with PMH of suprasellar germinoma s/p chemo with L sided motor tic disorder, HTN, anxiety, ADD, ataxia, seizures, 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  CTA head/neck with no LVO.  Neurology recommended DAPT x3 weeks, then plavix  alone.  Hospital course hypernatremia, may need to consult endocrinology for management of DDAVP  dosing.  Therapy ongoing and pt has been recommended for CIR.    Patient's medical record from [facility] has been reviewed by the rehabilitation admission coordinator and physician.   Past Medical History      Past Medical History:  Diagnosis Date   Anxiety     Attention deficit disorder 08/08/2006   Bilateral leg cramps 11/25/2006   Blood transfusion  without reported diagnosis 1997    Chemotherapy associated   Brain cancer (HCC) 1998   Cough 12/08/2015   COVID-19 10/16/2017   Elevated hemidiaphragm 07/09/2015    Left, presumably after portacath placement.    Fall 04/13/2022   H/O malignant neoplasm of brain 09/22/2014    Suprasellar germinoma treated at [facility] in 1997; received chemotherapy (cisplatin, etoposide, vincristine, and cyclophosphomide) and radiation therapy.  Complicated by left sided motor tic disorder and panhypopituitarism.    Hyperlipidemia 08/08/2006   Hypertension     Hyponatremia 05/30/2022   Hypothyroidism     Imbalance      at risk for falls   Night sweats 02/06/2018   Obesity (BMI 30.0-34.9) 07/09/2015   OSA (obstructive sleep apnea) 07/06/2022   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) 08/08/2006    Following therapy for the  suprasellar germinoma.  Includes central diabetes insipidus, secondary hypothyroidism, secondary adrenal insufficiency, and secondary hypogonadism.    Perennial allergic rhinitis 07/09/2015   Pre-diabetes      no meds   Preventative health care 07/09/2015   Seizure disorder (HCC) 08/08/2006    Predated brain tumor.  Generalized seizure ocurred after running out of lorazepam  post-tumor.    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 Treated with alendronate  from 2009-2016. DEXA (03/16/2008): L Spine T -2.0. L fem neck T -0.9, R fem neck T -1.9.  DEXA (10/15/2014): L Spine T -1.3, L fem neck T -0.6, R fem neck T -1.8.  Bisphosphonate holiday 06/2015.  Repeat DEXA scan in 10/2016 to reassess.    Weakness 09/15/2023          Has the patient had major surgery during 100 days prior to admission? No   Family History   family history includes Diabetes type I in his sister; Hyperlipidemia in his father; Hypertension in his father and mother; Prostate cancer in his father; Sleep apnea in his maternal aunt.   Current Medications  Current Medications    Current Facility-Administered Medications:     stroke: early stages of recovery book, , Does not apply, Once, Jerri Pfeiffer, MD   acetaminophen  (TYLENOL ) tablet 650 mg, 650 mg, Oral, Q6H PRN, 650 mg at 10/10/24 0904 **OR** acetaminophen  (TYLENOL ) suppository 650 mg, 650 mg, Rectal, Q6H PRN, Dibia, Landon BRAVO, MD   aspirin  chewable tablet 81 mg, 81 mg, Oral, Daily, Dibia, Pauline E, MD, 81 mg at 10/10/24 9096   bisacodyl  (DULCOLAX) EC tablet 5 mg, 5 mg, Oral, Daily PRN, Dibia, Pauline E, MD, 5 mg at 10/09/24 2102   clopidogrel  (PLAVIX ) tablet 75 mg, 75 mg, Oral, Daily, Dibia, Pauline E, MD, 75 mg at 10/10/24 9096   cyclobenzaprine  (FLEXERIL ) tablet 5 mg, 5 mg, Oral, QHS PRN, Dibia, Pauline E, MD, 5 mg at 10/05/24 2144   desmopressin  (DDAVP ) tablet 0.2 mg, 0.2 mg, Oral, TID, Pokhrel, Laxman, MD, 0.2 mg at 10/10/24 9061    heparin  injection 5,000 Units, 5,000 Units, Subcutaneous, Q8H, Dibia, Pauline E, MD, 5,000 Units at 10/10/24 0903   hydrocortisone  (CORTEF ) tablet 5 mg, 5 mg, Oral, Daily, Dibia, Pauline E, MD, 5 mg at 10/10/24 9095   levothyroxine  (SYNTHROID ) tablet 100 mcg, 100 mcg, Oral, Q0600, Dibia, Pauline E, MD, 100 mcg at 10/10/24 0859   LORazepam  (ATIVAN ) tablet 2 mg, 2 mg, Oral, Once, Dibia, Pauline E, MD   LORazepam  (ATIVAN ) tablet 6 mg, 6 mg, Oral, QHS, Dibia, Pauline E, MD, 6 mg at 10/09/24 2359   losartan  (COZAAR ) tablet 25 mg, 25  mg, Oral, Daily, Dibia, Pauline E, MD, 25 mg at 10/10/24 9095   ondansetron  (ZOFRAN ) tablet 4 mg, 4 mg, Oral, Q6H PRN **OR** ondansetron  (ZOFRAN ) injection 4 mg, 4 mg, Intravenous, Q6H PRN, Dibia, Pauline E, MD   potassium chloride  (KLOR-CON  M) CR tablet 10 mEq, 10 mEq, Oral, BID, Dibia, Pauline E, MD, 10 mEq at 10/10/24 9095   rosuvastatin  (CRESTOR ) tablet 20 mg, 20 mg, Oral, Daily, Jerri Pfeiffer, MD, 20 mg at 10/10/24 9095   sodium chloride  flush (NS) 0.9 % injection 3 mL, 3 mL, Intravenous, Q12H, Dibia, Pauline E, MD, 3 mL at 10/09/24 2103     Patients Current Diet:  Diet Order                  Diet general             Diet regular Room service appropriate? Yes; Fluid consistency: Thin; Fluid restriction: Other (see comments)  Diet effective now                         Precautions / Restrictions Precautions Precautions: Fall Precaution/Restrictions Comments: Left AFO Restrictions Weight Bearing Restrictions Per Provider Order: No    Has the patient had 2 or more falls or a fall with injury in the past year? Yes   Prior Activity Level Limited Community (1-2x/wk): walks with RW and AFO at baseline, assist for some ADLs, home with 24/7 supevision since brain ca   Prior Functional Level Self Care: Did the patient need help bathing, dressing, using the toilet or eating? Needed some help   Indoor Mobility: Did the patient need assistance with walking from  room to room (with or without device)? Independent   Stairs: Did the patient need assistance with internal or external stairs (with or without device)? Needed some help   Functional Cognition: Did the patient need help planning regular tasks such as shopping or remembering to take medications? Dependent   Patient Information Are you of Hispanic, Latino/a,or Spanish origin?: A. No, not of Hispanic, Latino/a, or Spanish origin What is your race?: B. Black or African American Do you need or want an interpreter to communicate with a doctor or health care staff?: 0. No Patient information obtained via proxy : per chart   Patient's Response To:  Health Literacy and Transportation Is the patient able to respond to health literacy and transportation needs?: No Health Literacy - How often do you need to have someone help you when you read instructions, pamphlets, or other written material from your doctor or pharmacy?: Patient unable to respond In the past 12 months, has lack of transportation kept you from medical appointments or from getting medications?: No In the past 12 months, has lack of transportation kept you from meetings, work, or from getting things needed for daily living?: No Health Literacy and Transportation obtained via proxy: per parents   Journalist, Newspaper / Equipment Home Equipment: Agricultural Consultant (2 wheels), [employer] - single point   Prior Device Use: Indicate devices/aids used by the patient prior to current illness, exacerbation or injury? Orthotics/Prosthetics   Current Functional Level Cognition   Arousal/Alertness: Awake/alert Overall Cognitive Status: History of cognitive impairments - at baseline (dad feels it's worse but when describing he refers more to his dysarthria that is worse) Orientation Level: Appropriate for developmental age Attention: Sustained Sustained Attention: Appears intact (for verbal but suspect difficulties during tasks) Memory:  Impaired Memory Impairment: Storage deficit, Retrieval deficit (0/3 words, 2  correct with semantic cues, 1 incorrect with choice, recalled ate breakfast and took meds, not recall stroke with delay) Awareness: Impaired Awareness Impairment: Intellectual impairment Problem Solving: Impaired Problem Solving Impairment: Verbal basic (most didn't understand due to dysarthria) Safety/Judgment: Impaired    Extremity Assessment (includes Sensation/Coordination)   Upper Extremity Assessment: LUE deficits/detail LUE Deficits / Details: L hand contracted at wrist and with decreased strength from previous CVA post brain tumor per father LUE Sensation: WNL LUE Coordination: decreased gross motor, decreased fine motor  Lower Extremity Assessment: Defer to PT evaluation RLE Deficits / Details: Somewhat difficult to assess with assist of 1 at EOB as pt with posteior lean.  ROM is [facility]; MMT: grossly 4/5 throughout LLE Deficits / Details: Somewhat difficult to assess with assist of 1 at EOB as pt with posteior lean.  Does have some deficits from prior CVA after brain tumor with limited L ankle ROM wearing AFO to ambulate and family reports L leg a little weaker than R baseline.  ROM: functional other than very limited ankle ROM with tight end feel all directions.  MMT: ankle 3/5, knee and hip 4-/5     ADLs   Overall ADL's : Needs assistance/impaired Eating/Feeding: Moderate assistance, Bed level Eating/Feeding Details (indicate cue type and reason): father feeding, but can usually complete himself Grooming: Minimal assistance, Sitting Upper Body Bathing: Minimal assistance, Sitting Lower Body Bathing: Maximal assistance, Sitting/lateral leans, Sit to/from stand Upper Body Dressing : Minimal assistance, Sitting Lower Body Dressing: Maximal assistance, Sit to/from stand, Sitting/lateral leans Lower Body Dressing Details (indicate cue type and reason): Pt able to demo reaching down to B socks to pull up while  sitting EOB. Assist to pull up L sock needed. Use of this strategy to correct posterior bias sitting EOB Toilet Transfer: Moderate assistance, Stand-pivot, BSC/3in1, Rolling walker (2 wheels) Toilet Transfer Details (indicate cue type and reason): simulated from bed Toileting- Clothing Manipulation and Hygiene: Moderate assistance, Sitting/lateral lean, Sit to/from stand Functional mobility during ADLs: Moderate assistance, +2 for physical assistance, +2 for safety/equipment, Cueing for safety, Cueing for sequencing, Rolling walker (2 wheels) General ADL Comments: Prior to this admission, patient living with his parents, able to ambulate without device, and able to complete dressing on his own. His father would assist with bathing for thoroughness. Currently, patient with need for mod A for ADL management, and mod A from an elevated surface to achieve standing. Patient with significant posterior lean in standing, therefore unable to attempt ambulation with only one person as support. Patient able to scoot to [facility] at supervision level, however requiring increased time and repetitions in order to be able to complete. Given prior level, and patient's 24/7 support, OT recommending intensive rehab >3 hours prior to discharge home. OT will continue to follow.     Mobility   Overal bed mobility: Needs Assistance Bed Mobility: Supine to Sit, Sit to Supine Supine to sit: Mod assist, HOB elevated Sit to supine: Mod assist, +2 for physical assistance, +2 for safety/equipment General bed mobility comments: cues/assist to initiate BLE to EOB, handheld assist to lift trunk with pt assisting to pull up. Mod A x 2 for truncal control and BLE back to bed.     Transfers   Overall transfer level: Needs assistance Equipment used: 2 person hand held assist Transfers: Sit to/from Stand Sit to Stand: Mod assist, +2 safety/equipment General transfer comment: Mod A to stand, +2 for safety to stand and once up, required up  to Max A x  2 to maintain standing balance. Required manual assist to lift LLE for side steps along bedside     Ambulation / Gait / Stairs / Wheelchair Mobility   Ambulation/Gait Ambulation/Gait assistance: Mod assist, +2 safety/equipment Gait Distance (Feet): 150 Feet Assistive device: Rolling walker (2 wheels), 1 person hand held assist Gait Pattern/deviations: Step-to pattern, Decreased stride length, Ataxic, Shuffle, Decreased dorsiflexion - left, Narrow base of support General Gait Details: gait deferred for today due to significant dynamic sitting and static standing balance deficits Gait velocity: decreased Gait velocity interpretation: <1.8 ft/sec, indicate of risk for recurrent falls     Posture / Balance Dynamic Sitting Balance Sitting balance - Comments: at least min assist for sitting balance however difficulty maintaining static sitting balance >10 sec with heavy posterrior lean. With practice able to self correct 50% of the time. Intermittent right lateral lean in sitting as well. Balance Overall balance assessment: Needs assistance Sitting-balance support: Bilateral upper extremity supported, Feet supported Sitting balance-Leahy Scale: Poor Sitting balance - Comments: at least min assist for sitting balance however difficulty maintaining static sitting balance >10 sec with heavy posterrior lean. With practice able to self correct 50% of the time. Intermittent right lateral lean in sitting as well. Postural control: Posterior lean Standing balance support: Bilateral upper extremity supported Standing balance-Leahy Scale: Poor Standing balance comment: continued heavy posterior lean in standing, unable to self correct requiring max to total assist to maintain static standing balance; with heavy physical cues he could improve posture and anterior weight shift. lateral weight shifts with music completed, however continued to require at least mod assist for balance.     Special  considerations/life events  Diabetic management A1C 6.0    Previous Home Environment (from acute therapy documentation) Living Arrangements: Parent  Lives With: Other (Comment) (parents) Available Help at Discharge: Available 24 hours/day Type of Home: House Home Layout: Two level, Bed/bath upstairs Alternate Level Stairs-Rails: Right Alternate Level Stairs-Number of Steps: 14 Home Access: Stairs to enter Entrance Stairs-Rails: None Entrance Stairs-Number of Steps: 2 Bathroom Shower/Tub: Engineer, Manufacturing Systems: Handicapped height Home Care Services: No   Discharge Living Setting Plans for Discharge Living Setting: Patient's home, Lives with (comment) (parents) Type of Home at Discharge: House Discharge Home Layout: Bed/bath upstairs, Two level Alternate Level Stairs-Rails: Right Alternate Level Stairs-Number of Steps: 14 Discharge Home Access: Stairs to enter Entrance Stairs-Rails: None Entrance Stairs-Number of Steps: 2 Discharge Bathroom Shower/Tub: Tub/shower unit Discharge Bathroom Toilet: Handicapped height Discharge Bathroom Accessibility: Yes How Accessible: Accessible via walker Does the patient have any problems obtaining your medications?: No   Social/Family/Support Systems Anticipated Caregiver: parents Takumi and [employer] Anticipated Caregiver's Contact Information: [place]  [phone]; [employer]  [phone] Ability/Limitations of Caregiver: no lifting Caregiver Availability: 24/7 Discharge Plan Discussed with Primary Caregiver: Yes Is Caregiver In Agreement with Plan?: Yes Does Caregiver/Family have Issues with Lodging/Transportation while Pt is in Rehab?: No   Goals Patient/Family Goal for Rehab: PT supervision to mod I, OT mod I to min assist, SLP supervision Expected length of stay: 14-18 days Additional Information: Discharge plan: home with parents who can provide supervision for mobility and min assist for adls. Pt/Family Agrees to Admission and  willing to participate: Yes Program Orientation Provided & Reviewed with Pt/Caregiver Including Roles  & Responsibilities: Yes   Decrease burden of Care through IP rehab admission: n/a   Possible need for SNF placement upon discharge: Not anticipated.  Plan for home with parents who can provide supervision and assist for ADLs  but no lifting.     Patient Condition: This patient's condition remains as documented in the consult dated 10/07/24, in which the Rehabilitation Physician determined and documented that the patient's condition is appropriate for intensive rehabilitative care in an inpatient rehabilitation facility. Will admit to inpatient rehab today.   Preadmission Screen Completed By:  Reche FORBES Lowers, PT, DPT 10/10/2024 10:41 AM with updates by Heron Leavell RN MSN ______________________________________________________________________   Discussed status with Dr. Lovorn on 10/10/24 at 1040 and received approval for admission today.   Admission Coordinator:  Reche Lowers, PT. DTP with updates by Leavell Heron Lot, RN MSN time 1040 Date 10/10/24    Assessment/Plan: Diagnosis: L BG stroke Does the need for close, 24 hr/day Medical supervision in concert with the patient's rehab needs make it unreasonable for this patient to be served in a less intensive setting? Yes Co-Morbidities requiring supervision/potential complications: R hemiparesis; pan hypopituitarism; cognitive disability since brain CA as teen; low Na chronic; seizure d/o; HTN Due to bladder management, bowel management, safety, skin/wound care, disease management, medication administration, pain management, and patient education, does the patient require 24 hr/day rehab nursing? Yes Does the patient require coordinated care of a physician, rehab nurse, PT, OT, and SLP to address physical and functional deficits in the context of the above medical diagnosis(es)? Yes Addressing deficits in the following areas: balance,  endurance, locomotion, strength, transferring, bowel/bladder control, bathing, dressing, feeding, grooming, toileting, cognition, and speech Can the patient actively participate in an intensive therapy program of at least 3 hrs of therapy 5 days a week? Yes The potential for patient to make measurable gains while on inpatient rehab is good Anticipated functional outcomes upon discharge from inpatient rehab: modified independent, supervision, and min assist PT, modified independent, supervision, and min assist OT, supervision SLP Estimated rehab length of stay to reach the above functional goals is: 14-18 days Anticipated discharge destination: Home 10. Overall Rehab/Functional Prognosis: good     MD Signature:            Revision History  Date/Time User Provider Type Action  10/10/2024 10:44 AM Cornelio Bouchard, MD Physician Sign  10/10/2024 10:41 AM Leavell Heron MATSU, RN Rehab Admission Coordinator Share  10/10/2024 10:41 AM Leavell Heron MATSU, RN Rehab Admission Coordinator Share  10/10/2024  8:45 AM Leavell Heron MATSU, RN Rehab Admission Coordinator Share  10/08/2024 12:45 PM Lowers Reche FORBES, PT Rehab Admission Coordinator Share   View Details Report

## 2024-10-10 NOTE — TOC Transition Note (Signed)
 Transition of Care [facility]) - Discharge Note   Patient Details  Name: Nicholas Holt MRN: [id number] Date of Birth: January 30, 1981  [facility] CM/SW Contact:  Tom-Johnson, Harvest Muskrat, RN Phone Number: 10/10/2024, 10:29 AM   Clinical Narrative:     Patient scheduled for discharge to CIR today. CM notified patient's father, Owais via phone.  Patient will be transported via bed as in-hospital transfer.  No further TOC needs noted.         Final next level of care: IP Rehab Facility Barriers to Discharge: Barriers Resolved   Patient Goals and CMS Choice Patient states their goals for this hospitalization and ongoing recovery are:: To go to CIR and return home CMS Medicare.gov Compare Post Acute Care list provided to:: Patient        Discharge Placement                Patient to be transferred to facility by: In-hospital transfer Name of family member notified: Father Jeoffrey    Discharge Plan and Services Additional resources added to the After Visit Summary for                                       Social Drivers of Health (SDOH) Interventions SDOH Screenings   Food Insecurity: No Food Insecurity (10/04/2024)  Housing: Low Risk  (10/04/2024)  Transportation Needs: No Transportation Needs (10/04/2024)  Utilities: Not At Risk (10/04/2024)  Alcohol Screen: Low Risk  (08/06/2024)  Depression (PHQ2-9): Low Risk  (08/06/2024)  Financial Resource Strain: Low Risk  (08/06/2024)  Physical Activity: Inactive (08/06/2024)  Social Connections: Patient Declined (08/06/2024)  Stress: No Stress Concern Present (08/06/2024)  Tobacco Use: Low Risk  (10/05/2024)     Readmission Risk Interventions    10/06/2024    2:39 PM  Readmission Risk Prevention Plan  Post Dischage Appt Complete  Medication Screening Complete  Transportation Screening Complete

## 2024-10-11 DIAGNOSIS — I10 Essential (primary) hypertension: Secondary | ICD-10-CM | POA: Diagnosis not present

## 2024-10-11 DIAGNOSIS — I639 Cerebral infarction, unspecified: Secondary | ICD-10-CM | POA: Diagnosis not present

## 2024-10-11 DIAGNOSIS — K5901 Slow transit constipation: Secondary | ICD-10-CM | POA: Diagnosis not present

## 2024-10-11 LAB — CBC WITH DIFFERENTIAL/PLATELET
Abs Immature Granulocytes: 0.01 K/uL (ref 0.00–0.07)
Basophils Absolute: 0 K/uL (ref 0.0–0.1)
Basophils Relative: 1 %
Eosinophils Absolute: 0.1 K/uL (ref 0.0–0.5)
Eosinophils Relative: 2 %
HCT: 39.5 % (ref 39.0–52.0)
Hemoglobin: 12.9 g/dL — ABNORMAL LOW (ref 13.0–17.0)
Immature Granulocytes: 0 %
Lymphocytes Relative: 26 %
Lymphs Abs: 1.5 K/uL (ref 0.7–4.0)
MCH: 29.7 pg (ref 26.0–34.0)
MCHC: 32.7 g/dL (ref 30.0–36.0)
MCV: 90.8 fL (ref 80.0–100.0)
Monocytes Absolute: 0.6 K/uL (ref 0.1–1.0)
Monocytes Relative: 10 %
Neutro Abs: 3.5 K/uL (ref 1.7–7.7)
Neutrophils Relative %: 61 %
Platelets: 160 K/uL (ref 150–400)
RBC: 4.35 MIL/uL (ref 4.22–5.81)
RDW: 15.5 % (ref 11.5–15.5)
WBC: 5.7 K/uL (ref 4.0–10.5)
nRBC: 0 % (ref 0.0–0.2)

## 2024-10-11 LAB — COMPREHENSIVE METABOLIC PANEL WITH GFR
ALT: 44 U/L (ref 0–44)
AST: 26 U/L (ref 15–41)
Albumin: 3.2 g/dL — ABNORMAL LOW (ref 3.5–5.0)
Alkaline Phosphatase: 51 U/L (ref 38–126)
Anion gap: 8 (ref 5–15)
BUN: 12 mg/dL (ref 6–20)
CO2: 26 mmol/L (ref 22–32)
Calcium: 8.9 mg/dL (ref 8.9–10.3)
Chloride: 108 mmol/L (ref 98–111)
Creatinine, Ser: 1.35 mg/dL — ABNORMAL HIGH (ref 0.61–1.24)
GFR, Estimated: >60 mL/min (ref >60)
Glucose, Bld: 112 mg/dL — ABNORMAL HIGH (ref 70–99)
Potassium: 4.1 mmol/L (ref 3.5–5.1)
Sodium: 142 mmol/L (ref 135–145)
Total Bilirubin: 0.6 mg/dL (ref 0.0–1.2)
Total Protein: 6.3 g/dL — ABNORMAL LOW (ref 6.5–8.1)

## 2024-10-11 MED ORDER — MAGNESIUM HYDROXIDE 400 MG/5ML PO SUSP
15.0000 mL | Freq: Once | ORAL | Status: AC
  Administered 2024-10-11: 15 mL via ORAL
  Filled 2024-10-11: qty 30

## 2024-10-11 NOTE — Evaluation (Signed)
 Physical Therapy Assessment and Plan  Patient Details  Name: Nicholas Holt MRN: [id number] Date of Birth: 1981/05/21  PT Diagnosis: Abnormal posture, Abnormality of gait, Coordination disorder, Difficulty walking, Hemiparesis non-dominant, and Impaired cognition Rehab Potential: Good ELOS: 2 weeks   Today's Date: 10/11/2024 PT Individual Time: 1300-1400 PT Individual Time Calculation (min): 60 min    Hospital Problem: Principal Problem:   Left basal ganglia embolic stroke [facility]) Active Problems:   Small vessel stroke [facility])   Past Medical History:  Past Medical History:  Diagnosis Date   Anxiety    Attention deficit disorder 08/08/2006   Bilateral leg cramps 11/25/2006   Blood transfusion without reported diagnosis 1997   Chemotherapy associated   Brain cancer (HCC) 1998   Cough 12/08/2015   COVID-19 10/16/2017   Elevated hemidiaphragm 07/09/2015   Left, presumably after portacath placement.    Fall 04/13/2022   H/O malignant neoplasm of brain 09/22/2014   Suprasellar germinoma treated at [facility] in 1997; received chemotherapy (cisplatin, etoposide, vincristine, and cyclophosphomide) and radiation therapy.  Complicated by left sided motor tic disorder and panhypopituitarism.    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  Includes central diabetes insipidus, secondary hypothyroidism, secondary adrenal insufficiency, and secondary hypogonadism.    Perennial allergic rhinitis 07/09/2015   Pre-diabetes    no meds   Preventative health care 07/09/2015   Seizure disorder (HCC) 08/08/2006   Predated brain tumor.   Generalized seizure ocurred after running out of lorazepam  post-tumor.    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 Treated with alendronate  from 2009-2016. DEXA (03/16/2008): L Spine T -2.0. L fem neck T -0.9, R fem neck T -1.9.  DEXA (10/15/2014): L Spine T -1.3, L fem neck T -0.6, R fem neck T -1.8.  Bisphosphonate holiday 06/2015.  Repeat DEXA scan in 10/2016 to reassess.    Weakness 09/15/2023   Past Surgical History:  Past Surgical History:  Procedure Laterality Date   BRAIN BIOPSY  11/14/1995   CSF SHUNT  11/14/1995   PORTACATH PLACEMENT     portacath removal     RADIOLOGY WITH ANESTHESIA N/A 08/30/2021   Procedure: MRI WITH ANESTHESIA BRAIN WITH AND WITHOUT CONTRAST;  Surgeon: Radiologist, Medication, MD;  Location: [facility] OR;  Service: Radiology;  Laterality: N/A;   RADIOLOGY WITH ANESTHESIA N/A 05/25/2022   Procedure: MRI WITH ANESTHESIA OF BRAIN WITH AND WITHOUT CONTRAST;  Surgeon: Radiologist, Medication, MD;  Location: [facility] OR;  Service: Radiology;  Laterality: N/A;   RADIOLOGY WITH ANESTHESIA N/A 10/05/2024   Procedure: MRI WITH ANESTHESIA;  Surgeon: Radiologist, Medication, MD;  Location: [facility] OR;  Service: Radiology;  Laterality: N/A;    Assessment & Plan Clinical Impression: Nicholas Holt is a 43 year old male with history of suprasellar germinoma s/p chemo and XRT '97 with residual Left hemiplegia cognitive deficits, panhypopituitarism,  anxiety d/o, ADD who was admitted on 10/03/24 with 4 day history of difficulty walking and worsening of dysarthria. Family reported recent change of DDAVP  from intranasal to oral route and was advised to present to ED by endocrinology due to concerns of sodium level which was 129 at  admission. CT head negative for acute changes and showed right burr hole from prior ventric with anterior right frontal encephalomalacia and chronic encephalomalacia left basal ganglia with finding c/w Fahr disease. Neurology consulted  and MRI brain done revealing 8 mm acute infarct in posterior limb left internal capsule. CTA brain was negative for LVO. Dr. Jerri felt that stroke likely related to small vessel radiation related vasculopathy and recommended DAPT X 3 week followed by Plavix  alone.    He was place on fluid restriction briefly but Dr. Faythe advised against this and DDAVP  dose  briefly reduced to  1 mg tid. Sodium has started trending up and DDAVP  increased to 2 mg TID today.  Reported to have poor sleep last night due to attempts to use male purewick as well as low grade fever  T- 99.9 this am    PT/OT has been working with patient who continues to be limited by left sided weakness with dysarthria, has significant posterior bias and right lateral lean needing multimodal cues to self correct with activity, has decreased insight with poor awareness of deficits and is working on pre-gait activity.  PTA: He was independent without AD. Father assisted with ADL. Mother sets out meds. CIR recommended due to functional decline  Patient currently requires mod with mobility secondary to muscle weakness and abnormal tone, decreased coordination, and decreased motor planning.  Prior to hospitalization, patient was supervision with mobility and lived with Family in a House home.  Home access is 2-3Stairs to enter.  Patient will benefit from skilled PT intervention to maximize safe functional mobility, minimize fall risk, and decrease caregiver burden for planned discharge home with 24 hour supervision.  Anticipate patient will benefit from follow up HH at discharge.  PT - End of Session Activity Tolerance: Tolerates 30+ min activity with multiple rests Endurance Deficit: Yes PT Assessment Rehab Potential (ACUTE/IP ONLY): Good PT Barriers to Discharge: Home environment access/layout;Decreased caregiver support PT Barriers to Discharge Comments: father hurt his back assisting pt . PT Patient demonstrates impairments in the following  area(s): Balance;Safety;Endurance;Motor PT Transfers Functional Problem(s): Bed Mobility;Bed to Chair;Car;Furniture PT Locomotion Functional Problem(s): Ambulation;Stairs PT Plan PT Intensity: Minimum of 1-2 x/day ,45 to 90 minutes PT Frequency: 5 out of 7 days PT Duration Estimated Length of Stay: 2 weeks PT Treatment/Interventions: Ambulation/gait training;Community reintegration;Neuromuscular re-education;Stair training;UE/LE Strength taining/ROM;UE/LE Coordination activities;Therapeutic Activities;Discharge planning;Balance/vestibular training;Functional mobility training;Patient/family education;Therapeutic Exercise PT Transfers Anticipated Outcome(s): min/CGA PT Locomotion Anticipated Outcome(s): min CGA w/ LRAD PT Recommendation Recommendations for Other Services: None Follow Up Recommendations: Home health PT Patient destination: Home Equipment Recommended: To be determined   PT Evaluation Precautions/Restrictions Precautions Precautions: Fall Precaution/Restrictions Comments: Left AFO, h/o left hemiplegia Restrictions Weight Bearing Restrictions Per Provider Order: No General   Vital SignsTherapy Vitals Temp: 97.7 F (36.5 C) Temp Source: Oral Pulse Rate: 90 Resp: 17 BP: (!) 138/90 Patient Position (if appropriate): Sitting Oxygen Therapy SpO2: 98 % O2 Device: Room Air Pain Pain Assessment Pain Scale: 0-10 (mother states that he will never state any pain.) Pain Score: 0-No pain Pain Interference Pain Interference Pain Effect on Sleep: 0. Does not apply - I have not had any pain or hurting in the past 5 days Pain Interference with Therapy Activities: 0. Does not apply - I have not received rehabilitationtherapy in the past 5 days Pain Interference with Day-to-Day Activities: 1. Rarely or not at all Home Living/Prior Functioning Home Living Available Help at Discharge: Available 24 hours/day Type of Home: House Home Access: Stairs  to enter Entrance  Stairs-Number of Steps: 2-3 Entrance Stairs-Rails: None Home Layout: Two level;Bed/bath upstairs Alternate Level Stairs-Number of Steps: 14 Alternate Level Stairs-Rails: Right (ascending) Bathroom Shower/Tub: Tub/shower unit Additional Comments: mother states that father assists pt into [employer].  Lives With: Family Prior Function Level of Independence: Independent with transfers;Independent with gait  Able to Take Stairs?: Yes Driving: No Vision/Perception     Cognition Overall Cognitive Status: Impaired/Different from baseline (Hx of cognitive deficits at baseline) Arousal/Alertness: Awake/alert Orientation Level: Oriented to person;Oriented to place Month: August Day of Week: Incorrect Attention: Sustained Sustained Attention: Impaired Memory: Impaired Memory Impairment: Storage deficit;Retrieval deficit;Decreased short term memory Awareness: Impaired Awareness Impairment: Intellectual impairment Problem Solving: Impaired Problem Solving Impairment: Functional basic Executive Function: Self Monitoring;Self Correcting Self Monitoring: Impaired Self Correcting: Impaired Behaviors: Other (comment) (fidgety) Safety/Judgment: Impaired Sensation Sensation Light Touch: Appears Intact Coordination Gross Motor Movements are Fluid and Coordinated: No Motor  Motor Motor: Hemiplegia;Abnormal postural alignment and control Motor - Skilled Clinical Observations: strong posterior lean   Trunk/Postural Assessment  Cervical Assessment Cervical Assessment: Within Functional Limits Thoracic Assessment Thoracic Assessment: Exceptions to [facility] Lumbar Assessment Lumbar Assessment: Exceptions to [facility] (posterior pelvic tilt.) Postural Control Postural Control: Deficits on evaluation Righting Reactions: leans backward Postural Limitations: strong posterior bias in sitting and standing  Balance Balance Balance Assessed: Yes Static Sitting Balance Static Sitting - Balance Support: Feet  supported;Right upper extremity supported Static Sitting - Level of Assistance: 3: Mod assist Static Standing Balance Static Standing - Balance Support: Right upper extremity supported;During functional activity Static Standing - Level of Assistance: 3: Mod assist Dynamic Standing Balance Dynamic Standing - Balance Support: Right upper extremity supported;During functional activity Dynamic Standing - Level of Assistance: 3: Mod assist Extremity Assessment      RLE Assessment RLE Assessment: Within Functional Limits General Strength Comments: grossly 4+/5 except hip flexion 3+/5 LLE Assessment LLE Assessment: Exceptions to [facility] General Strength Comments: hip flexion 2/5, knee ext 4/5, flex 4+/5  Care Tool Care Tool Bed Mobility Roll left and right activity   Roll left and right assist level: Moderate Assistance - Patient 50 - 74%    Sit to lying activity   Sit to lying assist level: Moderate Assistance - Patient 50 - 74%    Lying to sitting on side of bed activity   Lying to sitting on side of bed assist level: the ability to move from lying on the back to sitting on the side of the bed with no back support.: Moderate Assistance - Patient 50 - 74%     Care Tool Transfers Sit to stand transfer   Sit to stand assist level: Moderate Assistance - Patient 50 - 74%    Chair/bed transfer   Chair/bed transfer assist level: Moderate Assistance - Patient 50 - 74%    Car transfer   Car transfer assist level: Minimal Assistance - Patient > 75%      Care Tool Locomotion Ambulation   Assist level: Moderate Assistance - Patient 50 - 74% Assistive device: Hand held assist Max distance: 110  Walk 10 feet activity   Assist level: Moderate Assistance - Patient - 50 - 74% Assistive device: Hand held assist   Walk 50 feet with 2 turns activity   Assist level: Moderate Assistance - Patient - 50 - 74% Assistive device: Hand held assist  Walk 150 feet activity   Assist level: Moderate  Assistance - Patient - 50 - 74% Assistive device: Hand held assist  Walk 10  feet on uneven surfaces activity   Assist level: Moderate Assistance - Patient - 50 - 74% Assistive device: Hand held assist  Stairs   Assist level: Moderate Assistance - Patient - 50 - 74% Stairs assistive device: 2 hand rails Max number of stairs: 4  Walk up/down 1 step activity   Walk up/down 1 step (curb) assist level: Moderate Assistance - Patient - 50 - 74% Walk up/down 1 step or curb assistive device: 2 hand rails  Walk up/down 4 steps activity   Walk up/down 4 steps assist level: Moderate Assistance - Patient - 50 - 74% Walk up/down 4 steps assistive device: 2 hand rails  Walk up/down 12 steps activity   Walk up/down 12 steps assist level: Moderate Assistance - Patient - 50 - 74% Walk up/down 12 steps assistive device: 2 hand rails  Pick up small objects from floor   Pick up small object from the floor assist level: Moderate Assistance - Patient 50 - 74%    Wheelchair Is the patient using a wheelchair?: Yes Type of Wheelchair: Manual   Wheelchair assist level: Dependent - Patient 0% Max wheelchair distance: 150  Wheel 50 feet with 2 turns activity   Assist Level: Dependent - Patient 0%  Wheel 150 feet activity   Assist Level: Dependent - Patient 0%    Refer to Care Plan for Long Term Goals  SHORT TERM GOAL WEEK 1 PT Short Term Goal 1 (Week 1): Pt will transfer sup to sit w/ min A consistently from flat bed. PT Short Term Goal 2 (Week 1): Pt will transfer sit to stand w/ min A consistently. PT Short Term Goal 3 (Week 1): Pt will amb x 120' w/ min A and LRAD PT Short Term Goal 4 (Week 1): Pt will negotiate 4 steps w/ 1 rail and min A.  Recommendations for other services: None   Skilled Therapeutic Intervention Evaluation completed (see details above and below) with education on PT POC and goals and individual treatment initiated with focus on  NMR, transfers, balance, gait, strengthening.Pt  presents sitting in recliner but in strong post pelvic tilt.  Pt able to scoot self into recliner w/ cueing.  Shoes and L AFO donned w/ total A.  Pt transfers sit to stand w/ mod A and cues for sequencing and for forward lean.  Pt amb x 110' w/ R HHA, mod A and facilitation for weight shift R to advance LLE.  Pt initially able to advance LLE but then pt tries to go to fast and less than step-to gait pattern.  Mother states this is not like PTA.  Pt transfers in/out of SUV height simulation w/ mod/min A and ++ time.  Pt ambulates up ramped surface w/ increased cues for gait cycle.  Pt negotiates 4 steps w/ 2 handrails and mod A cues for step-to pattern and manual A for advancing LLE when descending.  Pt amb to bed and transfers sit to supine and then to sit w/ mod A from flat bed.  Pt amb to recliner and remained sitting w/ parents present, and PA.  Pt requests no chair alarm but will elevate LES.    Mobility Bed Mobility Bed Mobility: Supine to Sit;Sit to Supine Supine to Sit: Moderate Assistance - Patient 50-74% Sitting - Scoot to Edge of Bed: Moderate Assistance - Patient 50-74% Sit to Supine: Moderate Assistance - Patient 50-74% Transfers Transfers: Sit to Stand;Stand to Sit;Stand Pivot Transfers Sit to Stand: Moderate Assistance - Patient 50-74% Stand to Sit:  Moderate Assistance - Patient 50-74% Stand Pivot Transfers: Maximal Assistance - Patient 25 - 49% Stand Pivot Transfer Details: Verbal cues for technique;Verbal cues for sequencing;Manual facilitation for weight shifting Transfer (Assistive device): None Locomotion  Gait Ambulation: Yes Gait Assistance: Moderate Assistance - Patient 50-74% Gait Distance (Feet): 110 Feet Assistive device: 1 person hand held assist Gait Assistance Details: Verbal cues for gait pattern;Manual facilitation for weight shifting;Verbal cues for sequencing Gait Assistance Details: weight shift for LLE advancement, does not complete heel to toe, pre-morbid,  but more toe walking now. Gait Gait: Yes Gait Pattern: Step-to pattern;Step-through pattern;Decreased step length - left;Poor foot clearance - left;Decreased weight shift to right Gait velocity: decreased Stairs / Additional Locomotion Stairs: Yes Stairs Assistance: Moderate Assistance - Patient 50 - 74% Stair Management Technique: Two rails Number of Stairs: 4 Height of Stairs: 6 Ramp: Moderate Assistance - Patient 50 - 74% Curb: Moderate Assistance - Patient 50 - 74% Wheelchair Mobility Wheelchair Mobility: Yes Wheelchair Assistance: Dependent - Patient 0% Wheelchair Parts Management: Needs assistance Distance: 150   Discharge Criteria: Patient will be discharged from PT if patient refuses treatment 3 consecutive times without medical reason, if treatment goals not met, if there is a change in medical status, if patient makes no progress towards goals or if patient is discharged from hospital.  The above assessment, treatment plan, treatment alternatives and goals were discussed and mutually agreed upon: by patient  Reyes SHAUNNA Sierra 10/11/2024, 4:07 PM

## 2024-10-11 NOTE — Plan of Care (Signed)
 Problem: RH Balance Goal: LTG: Patient will maintain dynamic sitting balance (OT) Description: LTG:  Patient will maintain dynamic sitting balance with assistance during activities of daily living (OT) Flowsheets (Taken 10/11/2024 1231) LTG: Pt will maintain dynamic sitting balance during ADLs with: Supervision/Verbal cueing Goal: LTG Patient will maintain dynamic standing with ADLs (OT) Description: LTG:  Patient will maintain dynamic standing balance with assist during activities of daily living (OT)  Flowsheets (Taken 10/11/2024 1231) LTG: Pt will maintain dynamic standing balance during ADLs with: Minimal Assistance - Patient > 75%   Problem: Sit to Stand Goal: LTG:  Patient will perform sit to stand in prep for activites of daily living with assistance level (OT) Description: LTG:  Patient will perform sit to stand in prep for activites of daily living with assistance level (OT) Flowsheets (Taken 10/11/2024 1231) LTG: PT will perform sit to stand in prep for activites of daily living with assistance level: Minimal Assistance - Patient > 75%   Problem: RH Grooming Goal: LTG Patient will perform grooming w/assist,cues/equip (OT) Description: LTG: Patient will perform grooming with assist, with/without cues using equipment (OT) Flowsheets (Taken 10/11/2024 1231) LTG: Pt will perform grooming with assistance level of: Supervision/Verbal cueing   Problem: RH Bathing Goal: LTG Patient will bathe all body parts with assist levels (OT) Description: LTG: Patient will bathe all body parts with assist levels (OT) Flowsheets (Taken 10/11/2024 1231) LTG: Pt will perform bathing with assistance level/cueing: Minimal Assistance - Patient > 75%   Problem: RH Dressing Goal: LTG Patient will perform upper body dressing (OT) Description: LTG Patient will perform upper body dressing with assist, with/without cues (OT). Flowsheets (Taken 10/11/2024 1231) LTG: Pt will perform upper body dressing  with assistance level of: Set up assist Goal: LTG Patient will perform lower body dressing w/assist (OT) Description: LTG: Patient will perform lower body dressing with assist, with/without cues in positioning using equipment (OT) Flowsheets (Taken 10/11/2024 1231) LTG: Pt will perform lower body dressing with assistance level of: Minimal Assistance - Patient > 75%   Problem: RH Toileting Goal: LTG Patient will perform toileting task (3/3 steps) with assistance level (OT) Description: LTG: Patient will perform toileting task (3/3 steps) with assistance level (OT)  Flowsheets (Taken 10/11/2024 1231) LTG: Pt will perform toileting task (3/3 steps) with assistance level: Minimal Assistance - Patient > 75%   Problem: RH Functional Use of Upper Extremity Goal: LTG Patient will use RT/LT upper extremity as a (OT) Description: LTG: Patient will use right/left upper extremity as a stabilizer/gross assist/diminished/nondominant/dominant level with assist, with/without cues during functional activity (OT) Flowsheets (Taken 10/11/2024 1231) LTG: Use of upper extremity in functional activities: LUE as gross assist level LTG: Pt will use upper extremity in functional activity with assistance level of: Minimal Assistance - Patient > 75%   Problem: RH Toilet Transfers Goal: LTG Patient will perform toilet transfers w/assist (OT) Description: LTG: Patient will perform toilet transfers with assist, with/without cues using equipment (OT) Flowsheets (Taken 10/11/2024 1231) LTG: Pt will perform toilet transfers with assistance level of: Minimal Assistance - Patient > 75%   Problem: RH Tub/Shower Transfers Goal: LTG Patient will perform tub/shower transfers w/assist (OT) Description: LTG: Patient will perform tub/shower transfers with assist, with/without cues using equipment (OT) Flowsheets (Taken 10/11/2024 1231) LTG: Pt will perform tub/shower stall transfers with assistance level of: Minimal Assistance -  Patient > 75%   Problem: RH Attention Goal: LTG Patient will demonstrate this level of attention during functional activites (OT) Description: LTG:  Patient  will demonstrate this level of attention during functional activites  (OT) Flowsheets (Taken 10/11/2024 1231) Patient will demonstrate this level of attention during functional activites: Selective Patient will demonstrate above attention level in the following environment: Home LTG: Patient will demonstrate this level of attention during functional activites (OT): Minimal Assistance - Patient > 75%

## 2024-10-11 NOTE — Plan of Care (Signed)
  Problem: Consults Goal: RH STROKE PATIENT EDUCATION Description: See Patient Education module for education specifics  Outcome: Progressing   Problem: RH BOWEL ELIMINATION Goal: RH STG MANAGE BOWEL WITH ASSISTANCE Description: STG Manage Bowel with mod I Assistance. Outcome: Progressing Goal: RH STG MANAGE BOWEL W/MEDICATION W/ASSISTANCE Description: STG Manage Bowel with Medication with mod I Assistance. Outcome: Progressing   Problem: RH SAFETY Goal: RH STG ADHERE TO SAFETY PRECAUTIONS W/ASSISTANCE/DEVICE Description: STG Adhere to Safety Precautions With cues Assistance/Device. Outcome: Progressing   Problem: RH KNOWLEDGE DEFICIT Goal: RH STG INCREASE KNOWLEDGE OF HYPERTENSION Description: Patient and parents will be able to manage HTN using educational resources for medications and dietary modification independently Outcome: Progressing Goal: RH STG INCREASE KNOWLEGDE OF HYPERLIPIDEMIA Description: Patient and parents will be able to manage HLD using educational resources for medications and dietary modification independently Outcome: Progressing Goal: RH STG INCREASE KNOWLEDGE OF STROKE PROPHYLAXIS Description: Patient and parents will be able to manage secondary risks using educational resources for medications and dietary modification independently Outcome: Progressing

## 2024-10-11 NOTE — Progress Notes (Signed)
 PROGRESS NOTE   Subjective/Complaints:  Pt doing well, mother assists with some of the history.  States he slept ok but mom says he was restless-- doesn't want to adjust any meds though.  Pt denies pain.  LBM Wednesday per mom (nothing documented), would like something to help.  Urinating fine.  No other complaints or concerns.    ROS: as per HPI. Denies CP, SOB, abd pain, N/V/D, or any other complaints at this time.     Objective:   DG Chest 2 View Result Date: 10/10/2024 EXAM: 2 VIEW(S) XRAY OF THE CHEST 10/10/2024 04:28:00 PM COMPARISON: 3 / 27 / 19 CLINICAL HISTORY: Cough FINDINGS: LUNGS AND PLEURA: Low lung volume. Elevated left hemidiaphragm. Left basilar atelectasis. No pleural effusion. No pneumothorax. HEART AND MEDIASTINUM: No acute abnormality of the cardiac and mediastinal silhouettes. BONES AND SOFT TISSUES: No acute osseous abnormality. IMPRESSION: 1. No acute cardiopulmonary process. 2. Chronic elevation of the left hemidiaphragm. Electronically signed by: Norman Gatlin MD 10/10/2024 07:28 PM EST RP Workstation: [id number]   Recent Labs    10/10/24 0536 10/11/24 0536  WBC 10.6* 5.7  HGB 14.0 12.9*  HCT 42.6 39.5  PLT 195 160   Recent Labs    10/10/24 0536 10/11/24 0536  NA 146* 142  K 3.9 4.1  CL 111 108  CO2 25 26  GLUCOSE 98 112*  BUN 13 12  CREATININE 1.19 1.35*  CALCIUM  9.2 8.9        Intake/Output Summary (Last 24 hours) at 10/11/2024 1513 Last data filed at 10/11/2024 1307 Gross per 24 hour  Intake 831 ml  Output 650 ml  Net 181 ml        Physical Exam: Vital Signs Blood pressure (!) 138/90, pulse 90, temperature 97.7 F (36.5 C), temperature source Oral, resp. rate 17, height 5' 8 (1.727 m), weight 87.8 kg, SpO2 98%.   Constitutional: No distress . Vital signs reviewed. Up with PT, walked in room with assistance, mom and dad at bedside. HEENT: NCAT, EOMI, oral membranes  moist Neck: supple Cardiovascular: RRR without m/r/g appreciated. No JVD    Respiratory/Chest: CTA Bilaterally without wheezes or rales. Normal effort, no coughing GI/Abdomen: soft, +BS throughout, non-tender, non-distended Ext: no clubbing, cyanosis, or edema Psych: pleasant and cooperative, maybe a little flat Skin: dry, warm    PRIOR EXAMS: Musculoskeletal:     Cervical back: Neck supple.     Comments: RUE- 4+ to 5-/5 throughout RLE- 4+/5 throughout- stronger than expected LUE- biceps and triceps 5-/5; WE 2-/5 but somewhat contracted and posturing still; Grip 4-/5 and FA 2+/5 LLE- HF 4-/5; KE 4-/5; KF 3-/5; DF 2-/5 and PF 2-/5   Poaturing and contracture of L elbow and wrist- both in flexion at rest.      Skin:    General: Skin is warm and dry.     Comments: R forearm IV- looks OK  Neurological:     Mental Status: He is alert.     Comments: Answers basically that he's blessed, and fine- but no other wording used- is very dysarthric Somewhat Ataxic with arms as well B/L Cannot test sensation due to pt's inability to help with this  Psychiatric:     Comments: Flat- sleepy; focused on game   Assessment/Plan: 1. Functional deficits which require 3+ hours per day of interdisciplinary therapy in a comprehensive inpatient rehab setting. Physiatrist is providing close team supervision and 24 hour management of active medical problems listed below. Physiatrist and rehab team continue to assess barriers to discharge/monitor patient progress toward functional and medical goals  Care Tool:  Bathing    Body parts bathed by patient: Left arm, Chest, Abdomen, Front perineal area, Right upper leg, Left upper leg, Face   Body parts bathed by helper: Right arm, Right lower leg, Left lower leg, Buttocks     Bathing assist Assist Level: Moderate Assistance - Patient 50 - 74%     Upper Body Dressing/Undressing Upper body dressing   What is the patient wearing?: Pull over shirt     Upper body assist Assist Level: Minimal Assistance - Patient > 75%    Lower Body Dressing/Undressing Lower body dressing      What is the patient wearing?: Underwear/pull up, Pants     Lower body assist Assist for lower body dressing: Maximal Assistance - Patient 25 - 49%     Toileting Toileting Toileting Activity did not occur (Clothing management and hygiene only): N/A (no void or bm)  Toileting assist       Transfers Chair/bed transfer  Transfers assist     Chair/bed transfer assist level: Moderate Assistance - Patient 50 - 74%     Locomotion Ambulation   Ambulation assist              Walk 10 feet activity   Assist           Walk 50 feet activity   Assist           Walk 150 feet activity   Assist           Walk 10 feet on uneven surface  activity   Assist           Wheelchair     Assist               Wheelchair 50 feet with 2 turns activity    Assist            Wheelchair 150 feet activity     Assist          Blood pressure (!) 138/90, pulse 90, temperature 97.7 F (36.5 C), temperature source Oral, resp. rate 17, height 5' 8 (1.727 m), weight 87.8 kg, SpO2 98%.  Medical Problem List and Plan: 1. Functional deficits secondary to L inferior basal ganglia stroke (posterior limb of internal capsule)             -patient may  shower             -ELOS/Goals: 14-18 days min A to mod I  -Continue CIR 2.  Antithrombotics: -DVT/anticoagulation:  Pharmaceutical: Lovenox  40mg  daily             -antiplatelet therapy: DAPT X 3 weeks followed by Plavix  alone.  3. Pain Management: Tylenol  and flexeril  prn.  4. Mood/Behavior/Sleep: LCSW to follow for evaluation and support             --ativan  6mg  at nights for sleep/nightmares             -antipsychotic agents: N/A 5. Neuropsych/cognition: This patient is not fully capable of making decisions on his own behalf. 6. Skin/Wound Care: Routine pressure  relief measures.  7. Fluids/Electrolytes/Nutrition:  Monitor I/O. Routine labs. Continue Klor CR 10mEq BID 8. Panhypopituitarism: On hydrocortisone  5mg  daily, levothyroxine  100mcg daily, and DDAVP  0.2mg  TID-->followed by Dr. Faythe 9. Low grade fever:  CXR ordered for work up- if is negative, suggest U/A and Cx, since had 99.9 axillary temp last night -10/11/24 CXR neg, had another 99.9 reading last night, but none else, WBC today 5.7; will hold off on U/A and Cx for now, if WBC rise or further elevated temps, would proceed with this.  10.  HTN: Monitor BP tid--on Losartan  25mg  daily  -10/11/24 BPs fine, monitor  Vitals:   10/10/24 1545 10/10/24 1613 10/10/24 2009 10/11/24 0640  BP: 126/79 126/79 123/64 105/78   10/11/24 1308  BP: (!) 138/90    11.  Hypernatremia: Sodium trending back up and DDAVP  increased to 2 mg TID today  -10/11/24 Na 142 this AM, monitor routinely 12. Leucocytosis: WBC of 10.6- Monitor for fevers and other signs of infection  -10/11/24 down to 5.7 today, monitor 13.  H/o Supersella germinoma: Hx of left hemiparesis, cognitive deficits and seizures in the past (no meds)  14. Mild OSA: Unable to tolerate CPAP 15. Mild constipation- pt goes daily at home- might need to intervene if hasn't gone by tomorrow.  -10/11/24 no BM in 3 days, will give MoM 15ml once. Pt on Senokot S 2 tabs daily.  16. HLD: continue Rosuvastatin  20mg  daily    LOS: 1 days A FACE TO FACE EVALUATION WAS PERFORMED  [address] 10/11/2024, 3:13 PM

## 2024-10-11 NOTE — Plan of Care (Signed)
  Problem: RH Cognition - SLP Goal: RH LTG Patient will demonstrate orientation with cues Description:  LTG:  Patient will demonstrate orientation to person/place/time/situation with cues (SLP)   Flowsheets (Taken 10/11/2024 1617) LTG Patient will demonstrate orientation to:  Place  Person  Time LTG: Patient will demonstrate orientation using cueing (SLP): Moderate Assistance - Patient 50 - 74% Note: W/ use of compensatory aids   Problem: RH Expression Communication Goal: LTG Patient will increase speech intelligibility (SLP) Description: LTG: Patient will increase speech intelligibility at word/phrase/conversation level with cues, % of the time (SLP) Flowsheets (Taken 10/11/2024 1617) LTG: Patient will increase speech intelligibility (SLP): Moderate Assistance - Patient 50 - 74% Level: (sentence level) Phrase Percent of time patient will use intelligible speech: 85   Problem: RH Problem Solving Goal: LTG Patient will demonstrate problem solving for (SLP) Description: LTG:  Patient will demonstrate problem solving for basic/complex daily situations with cues  (SLP) Flowsheets (Taken 10/11/2024 1618) LTG: Patient will demonstrate problem solving for (SLP): Basic daily situations LTG Patient will demonstrate problem solving for: Moderate Assistance - Patient 50 - 74%   Problem: RH Memory Goal: LTG Patient will use memory compensatory aids to (SLP) Description: LTG:  Patient will use memory compensatory aids to recall biographical/new, daily complex information with cues (SLP) Flowsheets (Taken 10/11/2024 1618) LTG: Patient will use memory compensatory aids to (SLP): Moderate Assistance - Patient 50 - 74% Note: Recall recent/relevant info

## 2024-10-11 NOTE — Evaluation (Signed)
 Occupational Therapy Assessment and Plan  Patient Details  Name: Nicholas Holt MRN: [id number] Date of Birth: November 19, 1980  OT Diagnosis: abnormal posture, cognitive deficits, hemiplegia affecting non-dominant side, and muscle weakness (generalized) Rehab Potential: Rehab Potential (ACUTE ONLY): Good ELOS: 14-16 days   Today's Date: 10/11/2024 OT Individual Time: 9197-9082 OT Individual Time Calculation (min): 75 min     Hospital Problem: Principal Problem:   Left basal ganglia embolic stroke [facility]) Active Problems:   Small vessel stroke [facility])   Past Medical History:  Past Medical History:  Diagnosis Date   Anxiety    Attention deficit disorder 08/08/2006   Bilateral leg cramps 11/25/2006   Blood transfusion without reported diagnosis 1997   Chemotherapy associated   Brain cancer (HCC) 1998   Cough 12/08/2015   COVID-19 10/16/2017   Elevated hemidiaphragm 07/09/2015   Left, presumably after portacath placement.    Fall 04/13/2022   H/O malignant neoplasm of brain 09/22/2014   Suprasellar germinoma treated at [facility] in 1997; received chemotherapy (cisplatin, etoposide, vincristine, and cyclophosphomide) and radiation therapy.  Complicated by left sided motor tic disorder and panhypopituitarism.    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  Includes central diabetes insipidus, secondary hypothyroidism, secondary adrenal insufficiency, and secondary hypogonadism.    Perennial allergic rhinitis 07/09/2015   Pre-diabetes    no meds   Preventative health care 07/09/2015   Seizure disorder (HCC) 08/08/2006    Predated brain tumor.  Generalized seizure ocurred after running out of lorazepam  post-tumor.    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 Treated with alendronate  from 2009-2016. DEXA (03/16/2008): L Spine T -2.0. L fem neck T -0.9, R fem neck T -1.9.  DEXA (10/15/2014): L Spine T -1.3, L fem neck T -0.6, R fem neck T -1.8.  Bisphosphonate holiday 06/2015.  Repeat DEXA scan in 10/2016 to reassess.    Weakness 09/15/2023   Past Surgical History:  Past Surgical History:  Procedure Laterality Date   BRAIN BIOPSY  11/14/1995   CSF SHUNT  11/14/1995   PORTACATH PLACEMENT     portacath removal     RADIOLOGY WITH ANESTHESIA N/A 08/30/2021   Procedure: MRI WITH ANESTHESIA BRAIN WITH AND WITHOUT CONTRAST;  Surgeon: Radiologist, Medication, MD;  Location: [facility] OR;  Service: Radiology;  Laterality: N/A;   RADIOLOGY WITH ANESTHESIA N/A 05/25/2022   Procedure: MRI WITH ANESTHESIA OF BRAIN WITH AND WITHOUT CONTRAST;  Surgeon: Radiologist, Medication, MD;  Location: [facility] OR;  Service: Radiology;  Laterality: N/A;   RADIOLOGY WITH ANESTHESIA N/A 10/05/2024   Procedure: MRI WITH ANESTHESIA;  Surgeon: Radiologist, Medication, MD;  Location: [facility] OR;  Service: Radiology;  Laterality: N/A;    Assessment & Plan Clinical Impression: Nicholas Holt is a 43 year old male with history of suprasellar germinoma s/p chemo and XRT '97 with residual Left hemiplegia cognitive deficits, panhypopituitarism, anxiety d/o, ADD who was admitted on 10/03/24 with 4 day history of difficulty walking and worsening of dysarthria. Family reported recent change of DDAVP  from intranasal to oral route and was advised to present to ED by endocrinology due to concerns of sodium level which was  129 at admission. CT head negative for acute changes and showed right burr hole from prior ventric with anterior right frontal encephalomalacia and chronic encephalomalacia left basal ganglia with finding c/w Fahr disease.  Neurology consulted and MRI brain done revealing 8 mm acute infarct in posterior limb left internal capsule. Patient transferred to CIR on 10/10/2024 .    Patient currently requires max with basic self-care skills secondary to abnormal tone, unbalanced muscle activation, decreased coordination, and decreased motor planning, decreased midline orientation, decreased initiation, decreased attention, decreased awareness, decreased problem solving, decreased safety awareness, decreased memory, and delayed processing, and decreased sitting balance, decreased standing balance, decreased postural control, hemiplegia, and decreased balance strategies.  Prior to hospitalization, patient could complete BADL with min.  Patient will benefit from skilled intervention to decrease level of assist with basic self-care skills and increase independence with basic self-care skills prior to discharge home with care partner.  Anticipate patient will require minimal physical assistance and follow up home health and follow up outpatient.  OT - End of Session Activity Tolerance: Decreased this session Endurance Deficit: Yes OT Assessment Rehab Potential (ACUTE ONLY): Good OT Patient demonstrates impairments in the following area(s): Balance;Perception;Behavior;Safety;Cognition;Sensory;Endurance;Motor OT Basic ADL's Functional Problem(s): Eating;Grooming;Bathing;Dressing;Toileting OT Advanced ADL's Functional Problem(s): None OT Transfers Functional Problem(s): Toilet;Tub/Shower OT Additional Impairment(s): Fuctional Use of Upper Extremity OT Plan OT Intensity: Minimum of 1-2 x/day, 45 to 90 minutes OT Frequency: 5 out of 7 days OT Duration/Estimated Length of Stay: 14-16 days OT Treatment/Interventions: Balance/vestibular training;Discharge planning;Functional electrical stimulation;Self Care/advanced ADL retraining;Therapeutic Activities;UE/LE Coordination activities;Cognitive remediation/compensation;Functional  mobility training;Patient/family education;Skin care/wound managment;Therapeutic Exercise;Visual/perceptual remediation/compensation;DME/adaptive equipment instruction;Neuromuscular re-education;Psychosocial support;Splinting/orthotics;UE/LE Strength taining/ROM;Wheelchair propulsion/positioning OT Self Feeding Anticipated Outcome(s): Supervision/ Set up OT Basic Self-Care Anticipated Outcome(s): Min assist OT Toileting Anticipated Outcome(s): Min assist OT Bathroom Transfers Anticipated Outcome(s): Min assist OT Recommendation Recommendations for Other Services: None Patient destination: Home Follow Up Recommendations: Home health OT;Outpatient OT Equipment Recommended: To be determined   OT Evaluation Precautions/Restrictions  Precautions Precautions: Fall Recall of Precautions/Restrictions: Impaired Precaution/Restrictions Comments: Left AFO, h/o left hemiplegia Restrictions Weight Bearing Restrictions Per Provider Order: No   Pain Pain Assessment Pain Scale: 0-10 Pain Score: 0-No pain Home Living/Prior Functioning Home Living Family/patient expects to be discharged to:: Private residence Living Arrangements: Parent Available Help at Discharge: Available 24 hours/day Type of Home: House Home Access: Stairs to enter Secretary/administrator of Steps: 2 Entrance Stairs-Rails: None Home Layout: Two level, Bed/bath upstairs Alternate Level Stairs-Number of Steps: 14 Alternate Level Stairs-Rails: Right Bathroom Shower/Tub: Engineer[employer]: Handicapped height  Lives With: Family IADL History Homemaking Responsibilities: No Current License: No Leisure and Hobbies: gaming, occasionally [facility] [employer] with his mom Prior Function Level of Independence: Needs assistance with ADLs Bath: Minimal Dressing: Minimal Driving: No Vocation: Unemployed Vision Baseline Vision/History: 1 Wears glasses Ability to See in Adequate Light: 0 Adequate Patient Visual Report:  No change from baseline Vision Assessment?: No apparent visual deficits Perception  Perception: Impaired Praxis Praxis: Impaired Praxis Impairment Details: Initiation;Organization Cognition Cognition Overall Cognitive Status: History of cognitive impairments - at baseline Arousal/Alertness: Awake/alert Orientation Level: Place Memory: Impaired Memory Impairment: Retrieval deficit;Storage deficit Attention: Sustained Sustained Attention: Impaired Sustained Attention Impairment: Functional basic Awareness: Impaired Awareness Impairment: Intellectual impairment Problem Solving: Impaired Problem Solving Impairment: Functional basic Executive Function: Organizing;Self Correcting Organizing: Impaired Organizing Impairment: Functional basic Self Correcting: Impaired Self Correcting Impairment: Functional basic Behaviors: Perseveration Safety/Judgment: Impaired Brief Interview for Mental Status (BIMS) Repetition of Three Words (First Attempt):  3 Temporal Orientation: Year: Missed by more than 5 years Temporal Orientation: Month: Missed by more than 1 month Temporal Orientation: Day: Incorrect Recall: Sock: Yes, no cue required Recall: Blue: Yes, after cueing (a color) Recall: Bed: Yes, no cue required BIMS Summary Score: 8 Sensation Sensation Light Touch: Impaired by gross assessment Hot/Cold: Appears Intact Proprioception: Impaired by gross assessment Stereognosis: Not tested Motor  Motor Motor: Hemiplegia;Abnormal postural alignment and control Motor - Skilled Clinical Observations: strong posterior lean  Trunk/Postural Assessment  Cervical Assessment Cervical Assessment: Within Functional Limits Thoracic Assessment Thoracic Assessment: Exceptions to [facility] (thoracic flexion) Lumbar Assessment Lumbar Assessment: Exceptions to [facility] (posterior tilt of pelvis) Postural Control Postural Control: Deficits on evaluation Righting Reactions: leans backward Postural  Limitations: strong posterior bias in sitting and standing  Balance Balance Balance Assessed: Yes Static Sitting Balance Static Sitting - Balance Support: Right upper extremity supported;Feet supported Static Sitting - Level of Assistance: 3: Mod assist Dynamic Sitting Balance Dynamic Sitting - Balance Support: Right upper extremity supported;Left upper extremity supported;During functional activity;Feet supported Dynamic Sitting - Level of Assistance: 3: Mod assist Static Standing Balance Static Standing - Balance Support: Right upper extremity supported;Left upper extremity supported;During functional activity Static Standing - Level of Assistance: 3: Mod assist Dynamic Standing Balance Dynamic Standing - Balance Support: Right upper extremity supported;Left upper extremity supported;During functional activity Dynamic Standing - Level of Assistance: 3: Mod assist Extremity/Trunk Assessment RUE Assessment RUE Assessment: Within Functional Limits LUE Assessment LUE Assessment: Exceptions to [facility] Active Range of Motion (AROM) Comments: h/o left hmiplegia - able to reach left arm overhead~ 110*, lacks full elbow extension, lacks passive/activewrist extension - positioned in full wrist flexion LUE Body System: Neuro Brunstrum levels for arm and hand: Arm;Hand Brunstrum level for arm: Stage III Synergy is performed voluntarily Brunstrum level for hand: Stage II Synergy is developing  Care Tool Care Tool Self Care Eating Eating activity did not occur: Refused      Oral Care  Oral care, brush teeth, clean dentures activity did not occur: Refused      Bathing   Body parts bathed by patient: Left arm;Chest;Abdomen;Front perineal area;Right upper leg;Left upper leg;Face Body parts bathed by helper: Right arm;Right lower leg;Left lower leg;Buttocks   Assist Level: Moderate Assistance - Patient 50 - 74%    Upper Body Dressing(including orthotics)   What is the patient wearing?: Pull  over shirt   Assist Level: Minimal Assistance - Patient > 75%    Lower Body Dressing (excluding footwear)   What is the patient wearing?: Underwear/pull up;Pants Assist for lower body dressing: Maximal Assistance - Patient 25 - 49%    Putting on/Taking off footwear   What is the patient wearing?: Socks;Shoes;AFO Assist for footwear: Total Assistance - Patient < 25%       Care Tool Toileting Toileting activity Toileting Activity did not occur (Clothing management and hygiene only): N/A (no void or bm)       Care Tool Bed Mobility Roll left and right activity   Roll left and right assist level: Moderate Assistance - Patient 50 - 74%    Sit to lying activity Sit to lying activity did not occur: Refused      Lying to sitting on side of bed activity   Lying to sitting on side of bed assist level: the ability to move from lying on the back to sitting on the side of the bed with no back support.: Moderate Assistance - Patient 50 - 74%  Care Tool Transfers Sit to stand transfer   Sit to stand assist level: Maximal Assistance - Patient 25 - 49%    Chair/bed transfer   Chair/bed transfer assist level: Moderate Assistance - Patient 50 - 74%     Toilet transfer Toilet transfer activity did not occur: Refused       Care Tool Cognition  Expression of Ideas and Wants Expression of Ideas and Wants: 2. Frequent difficulty - frequently exhibits difficulty with expressing needs and ideas  Understanding Verbal and Non-Verbal Content Understanding Verbal and Non-Verbal Content: 2. Sometimes understands - understands only basic conversations or simple, direct phrases. Frequently requires cues to understand   Memory/Recall Ability Memory/Recall Ability : That he or she is in a hospital/hospital unit   Refer to Care Plan for Long Term Goals  SHORT TERM GOAL WEEK 1 OT Short Term Goal 1 (Week 1): Patient will sit unsupported at edge of bed x 15 seconds as needed to prepare for level  surface transfer to chair. OT Short Term Goal 2 (Week 1): Patient will sufficiently lean forward while seated to safely reach to feet during bathing. OT Short Term Goal 3 (Week 1): Patient will transition to standing from sitting with min assist as needed to pull up/down clothing during BADL.  Recommendations for other services: None    Skilled Therapeutic Intervention:   Patient received supine in bed.  Parents at bedside.  Patient awake and alert - very bright this am.  Patient agreeable to get out of bed for therapy, needing cueing to slow down and wait for next step of each task.  Patient assisted to roll and to sit at edge of bed, and to scoot forward so feet in contact with floor.  Patient falls backward with no attempt to adjust his balance.  Patient encouraged to lean forward to maintain static sitting balance and for sit to stand.   Patient transported to sink to bathe and dress.  Parents indicate he took tub bath at home, and needed assist to tie shoes.  Patient with poorly controlled static sitting or standing balance, needing frequent cues to facilitate forward weight shift.  Patient transferred to recliner at end of session.  Safety belt in place and engaged and parents at bedside.   ADL ADL Eating: Not assessed Grooming: Moderate assistance Where Assessed-Grooming: Sitting at sink Upper Body Bathing: Minimal assistance Where Assessed-Upper Body Bathing: Sitting at sink Lower Body Bathing: Moderate assistance Where Assessed-Lower Body Bathing: Standing at sink Upper Body Dressing: Minimal assistance Where Assessed-Upper Body Dressing: Sitting at sink Lower Body Dressing: Maximal assistance Where Assessed-Lower Body Dressing: Sitting at sink;Standing at sink Toileting: Unable to assess Toilet Transfer: Unable to assess Toilet Transfer Method: Unable to assess Tub/Shower Transfer: Unable to assess Tub/Shower Transfer Method: Unable to assess Film/video Editor: Unable to  assess [employer] Method: Unable to assess ADL Comments: STRONG posterior lean in sitting and standing Mobility  Bed Mobility Bed Mobility: Rolling Left;Left Sidelying to Sit;Sitting - Scoot to Edge of Bed Rolling Left: Moderate Assistance - Patient 50-74% Left Sidelying to Sit: Moderate Assistance - Patient 50-74% Sitting - Scoot to Edge of Bed: Moderate Assistance - Patient 50-74% Transfers Sit to Stand: Moderate Assistance - Patient 50-74% Stand to Sit: Moderate Assistance - Patient 50-74%   Discharge Criteria: Patient will be discharged from OT if patient refuses treatment 3 consecutive times without medical reason, if treatment goals not met, if there is a change in medical status, if patient makes no  progress towards goals or if patient is discharged from hospital.  The above assessment, treatment plan, treatment alternatives and goals were discussed and mutually agreed upon: by patient and by family  Dainel Arcidiacono M 10/11/2024, 11:01 AM

## 2024-10-11 NOTE — Evaluation (Signed)
 Speech Language Pathology Assessment and Plan  Patient Details  Name: Nicholas Holt MRN: [id number] Date of Birth: 1981/05/17  SLP Diagnosis: Dysarthria;Cognitive Impairments  Rehab Potential: Fair ELOS: 2 weeks    Today's Date: 10/11/2024 SLP Individual Time: 1100-1200 SLP Individual Time Calculation (min): 60 min   Hospital Problem: Principal Problem:   Left basal ganglia embolic stroke [facility]) Active Problems:   Small vessel stroke [facility])  Past Medical History:  Past Medical History:  Diagnosis Date   Anxiety    Attention deficit disorder 08/08/2006   Bilateral leg cramps 11/25/2006   Blood transfusion without reported diagnosis 1997   Chemotherapy associated   Brain cancer (HCC) 1998   Cough 12/08/2015   COVID-19 10/16/2017   Elevated hemidiaphragm 07/09/2015   Left, presumably after portacath placement.    Fall 04/13/2022   H/O malignant neoplasm of brain 09/22/2014   Suprasellar germinoma treated at [facility] in 1997; received chemotherapy (cisplatin, etoposide, vincristine, and cyclophosphomide) and radiation therapy.  Complicated by left sided motor tic disorder and panhypopituitarism.    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  Includes central diabetes insipidus, secondary hypothyroidism, secondary adrenal insufficiency, and secondary hypogonadism.    Perennial allergic rhinitis 07/09/2015   Pre-diabetes    no meds   Preventative health care 07/09/2015   Seizure disorder (HCC) 08/08/2006   Predated brain tumor.  Generalized seizure ocurred after running out of lorazepam  post-tumor.     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 Treated with alendronate  from 2009-2016. DEXA (03/16/2008): L Spine T -2.0. L fem neck T -0.9, R fem neck T -1.9.  DEXA (10/15/2014): L Spine T -1.3, L fem neck T -0.6, R fem neck T -1.8.  Bisphosphonate holiday 06/2015.  Repeat DEXA scan in 10/2016 to reassess.    Weakness 09/15/2023   Past Surgical History:  Past Surgical History:  Procedure Laterality Date   BRAIN BIOPSY  11/14/1995   CSF SHUNT  11/14/1995   PORTACATH PLACEMENT     portacath removal     RADIOLOGY WITH ANESTHESIA N/A 08/30/2021   Procedure: MRI WITH ANESTHESIA BRAIN WITH AND WITHOUT CONTRAST;  Surgeon: Radiologist, Medication, MD;  Location: [facility] OR;  Service: Radiology;  Laterality: N/A;   RADIOLOGY WITH ANESTHESIA N/A 05/25/2022   Procedure: MRI WITH ANESTHESIA OF BRAIN WITH AND WITHOUT CONTRAST;  Surgeon: Radiologist, Medication, MD;  Location: [facility] OR;  Service: Radiology;  Laterality: N/A;   RADIOLOGY WITH ANESTHESIA N/A 10/05/2024   Procedure: MRI WITH ANESTHESIA;  Surgeon: Radiologist, Medication, MD;  Location: [facility] OR;  Service: Radiology;  Laterality: N/A;    Assessment / Plan / Recommendation Clinical Impression Pt is a 43 year old male with history of suprasellar germinoma s/p chemo and XRT '97 with residual Left hemiplegia cognitive deficits, panhypopituitarism,  anxiety d/o, ADD who was admitted on 10/03/24 with 4 day history of difficulty walking and worsening of dysarthria. Family reported recent change of DDAVP  from intranasal to oral route and was advised to present to ED by endocrinology due to concerns of sodium level which was 129 at admission. CT head negative for acute changes and showed right burr  hole from prior ventric with anterior right frontal encephalomalacia and chronic encephalomalacia left basal ganglia with finding c/w Fahr disease. Neurology consulted and MRI brain done revealing 8 mm acute infarct in posterior limb left  internal capsule. CTA brain was negative for LVO. Dr. Jerri felt that stroke likely related to small vessel radiation related vasculopathy and recommended DAPT X 3 week followed by Plavix  alone. CIR recommended due to functional decline.   Cognitive-Linguistic: Pt presented w/ severe dysarthria characterized by limited articulatory precision and low vocal intensity. Pt's dad reported notable change in speech production s/p CVA. He presented w/ severe cognitive deficits as well, scoring 11/30 on the [facility] Mental Status (SLUMS) Exam. Of note cognitive deficits noted at baseline given hx of brain CA. However, his dad reported reduced success w/ STM. Orientation and problem solving deficits also apparent in functional tasks. Expressive/receptive language appear WFL. Recommend skilled ST to target motor speech and cognitive deficits, maximize pt independence, and reduce caregiver burden.  Bedside Swallow: No concerns re oropharyngeal dysphagia at this time. No overt s/s of airway invasion noted w/ regular textures or thin liquids via straw despite large bites. Pt's dad reported this is baseline. Continue w/ meds whole w/ liquid. Do recommend full supervision during meals, though family OK to assist. Supervision required d/t cog only - dysphagia intervention not warranted.        Skilled Therapeutic Interventions          SLP facilitated a cognitive-linguistic evaluation and brief bedside swallow screen to assess pt's cognitive-communication skills and determine need for additional skilled ST services. See above for more information.    SLP Assessment  Patient will need skilled Speech Lanaguage Pathology Services during CIR admission    Recommendations  SLP Diet Recommendations: Age appropriate regular solids;Thin Liquid Administration via: Straw Medication Administration: Whole meds with liquid Supervision: Patient able to self feed;Full supervision/cueing for compensatory  strategies Compensations: Minimize environmental distractions;Slow rate;Small sips/bites Postural Changes and/or Swallow Maneuvers: Seated upright 90 degrees;Upright 30-60 min after meal Oral Care Recommendations: Oral care BID Patient destination: Home Follow up Recommendations: Outpatient SLP;24 hour supervision/assistance Equipment Recommended: None recommended by SLP    SLP Frequency 3 to 5 out of 7 days   SLP Duration  SLP Intensity  SLP Treatment/Interventions 2 weeks  Minumum of 1-2 x/day, 30 to 90 minutes  Cognitive remediation/compensation;Environmental controls;Cueing hierarchy;Functional tasks;Therapeutic Activities;Internal/external aids;Patient/family education;Speech/Language facilitation    Pain Pain Assessment Pain Scale: 0-10 (mother states that he will never state any pain.) Pain Score: 0-No pain  Prior Functioning Type of Home: House  Lives With: Family Available Help at Discharge: Available 24 hours/day  SLP Evaluation Cognition Overall Cognitive Status: Impaired/Different from baseline (Hx of cognitive deficits at baseline) Arousal/Alertness: Awake/alert Orientation Level: Oriented to person;Oriented to place Month: August Day of Week: Incorrect Attention: Sustained Sustained Attention: Impaired Memory: Impaired Memory Impairment: Storage deficit;Retrieval deficit;Decreased short term memory Awareness: Impaired Awareness Impairment: Intellectual impairment Problem Solving: Impaired Problem Solving Impairment: Functional basic Executive Function: Self Monitoring;Self Correcting Self Monitoring: Impaired Self Correcting: Impaired Behaviors: Other (comment) (fidgety) Safety/Judgment: Impaired  Comprehension Auditory Comprehension Overall Auditory Comprehension: Appears within functional limits for tasks assessed Expression Expression Primary Mode of Expression: Verbal Verbal Expression Overall Verbal Expression: Appears within functional  limits for tasks assessed Oral Motor Oral Motor/Sensory Function Overall Oral Motor/Sensory Function: Mild impairment Facial ROM: Reduced left Facial Symmetry: Abnormal symmetry left Lingual ROM: Reduced left Lingual Symmetry: Abnormal symmetry left Motor Speech Overall Motor Speech: Impaired Respiration:  Impaired Level of Impairment: Word Phonation: Low vocal intensity Resonance: Within functional limits Articulation: Impaired Level of Impairment: Word Intelligibility: Intelligibility reduced Word: 50-74% accurate Phrase: 25-49% accurate Sentence: 25-49% accurate Conversation: 25-49% accurate Motor Planning: Within functional limits Interfering Components: Premorbid status Effective Techniques: Increased vocal intensity;Over-articulate  Care Tool Care Tool Cognition Ability to hear (with hearing aid or hearing appliances if normally used Ability to hear (with hearing aid or hearing appliances if normally used): 0. Adequate - no difficulty in normal conservation, social interaction, listening to TV   Expression of Ideas and Wants Expression of Ideas and Wants: 2. Frequent difficulty - frequently exhibits difficulty with expressing needs and ideas   Understanding Verbal and Non-Verbal Content Understanding Verbal and Non-Verbal Content: 3. Usually understands - understands most conversations, but misses some part/intent of message. Requires cues at times to understand  Memory/Recall Ability Memory/Recall Ability : Current season;That he or she is in a hospital/hospital unit   Motor Speech Intelligibility: Intelligibility reduced Word: 50-74% accurate Phrase: 25-49% accurate Sentence: 25-49% accurate Conversation: 25-49% accurate  Bedside Swallowing Assessment See clinical impression  Short Term Goals: Week 1: SLP Short Term Goal 1 (Week 1): Pt will utilize compensatory speech strategies at phrase level to maintain 75% intelligibility w/ maxA cues SLP Short Term Goal 2  (Week 1): Pt will utilize compensatory speech strategies at word level to maintain 85% intelligibility w/ maxA cues SLP Short Term Goal 3 (Week 1): Pt will utilize external memory aids to recall recent/relevant info w/ maxA SLP Short Term Goal 4 (Week 1): Pt will utilize external memory aids to be AOx3 w/ modA SLP Short Term Goal 5 (Week 1): Pt will solve functional problems w/ maxA  Refer to Care Plan for Long Term Goals  Recommendations for other services: Therapeutic Recreation  Pet therapy  Discharge Criteria: Patient will be discharged from SLP if patient refuses treatment 3 consecutive times without medical reason, if treatment goals not met, if there is a change in medical status, if patient makes no progress towards goals or if patient is discharged from hospital.  The above assessment, treatment plan, treatment alternatives and goals were discussed and mutually agreed upon: by patient  [name] 10/11/2024, 4:14 PM

## 2024-10-12 DIAGNOSIS — I639 Cerebral infarction, unspecified: Secondary | ICD-10-CM | POA: Diagnosis not present

## 2024-10-12 DIAGNOSIS — I10 Essential (primary) hypertension: Secondary | ICD-10-CM | POA: Diagnosis not present

## 2024-10-12 DIAGNOSIS — K5901 Slow transit constipation: Secondary | ICD-10-CM | POA: Diagnosis not present

## 2024-10-12 LAB — URINALYSIS, W/ REFLEX TO CULTURE (INFECTION SUSPECTED)
Bacteria, UA: NONE SEEN
Bilirubin Urine: NEGATIVE
Glucose, UA: NEGATIVE mg/dL
Hgb urine dipstick: NEGATIVE
Ketones, ur: NEGATIVE mg/dL
Leukocytes,Ua: NEGATIVE
Nitrite: NEGATIVE
Protein, ur: NEGATIVE mg/dL
Specific Gravity, Urine: 1.025 (ref 1.005–1.030)
pH: 7 (ref 5.0–8.0)

## 2024-10-12 NOTE — Plan of Care (Signed)
  Problem: RH BOWEL ELIMINATION Goal: RH STG MANAGE BOWEL WITH ASSISTANCE Description: STG Manage Bowel with mod I Assistance. Outcome: Progressing Goal: RH STG MANAGE BOWEL W/MEDICATION W/ASSISTANCE Description: STG Manage Bowel with Medication with mod I Assistance. Outcome: Progressing   Problem: RH SAFETY Goal: RH STG ADHERE TO SAFETY PRECAUTIONS W/ASSISTANCE/DEVICE Description: STG Adhere to Safety Precautions With cues Assistance/Device. Outcome: Progressing   Problem: RH COGNITION-NURSING Goal: RH STG USES MEMORY AIDS/STRATEGIES W/ASSIST TO PROBLEM SOLVE Description: STG Uses Memory Aids/Strategies With cues Assistance to Problem Solve. Outcome: Progressing

## 2024-10-12 NOTE — Progress Notes (Signed)
 PROGRESS NOTE   Subjective/Complaints:  Pt doing well once again. States he slept ok but again mom says he was restless, endorses that he did nap yesterday so maybe that was it.   Pt denies pain.  LBM yesterday!  Urinating fine.  No other complaints or concerns.    ROS: as per HPI. Denies CP, SOB, abd pain, N/V/D, or any other complaints at this time.     Objective:   DG Chest 2 View Result Date: 10/10/2024 EXAM: 2 VIEW(S) XRAY OF THE CHEST 10/10/2024 04:28:00 PM COMPARISON: 3 / 27 / 19 CLINICAL HISTORY: Cough FINDINGS: LUNGS AND PLEURA: Low lung volume. Elevated left hemidiaphragm. Left basilar atelectasis. No pleural effusion. No pneumothorax. HEART AND MEDIASTINUM: No acute abnormality of the cardiac and mediastinal silhouettes. BONES AND SOFT TISSUES: No acute osseous abnormality. IMPRESSION: 1. No acute cardiopulmonary process. 2. Chronic elevation of the left hemidiaphragm. Electronically signed by: Norman Gatlin MD 10/10/2024 07:28 PM EST RP Workstation: [id number]   Recent Labs    10/10/24 0536 10/11/24 0536  WBC 10.6* 5.7  HGB 14.0 12.9*  HCT 42.6 39.5  PLT 195 160   Recent Labs    10/10/24 0536 10/11/24 0536  NA 146* 142  K 3.9 4.1  CL 111 108  CO2 25 26  GLUCOSE 98 112*  BUN 13 12  CREATININE 1.19 1.35*  CALCIUM  9.2 8.9        Intake/Output Summary (Last 24 hours) at 10/12/2024 0946 Last data filed at 10/11/2024 1813 Gross per 24 hour  Intake 476 ml  Output 100 ml  Net 376 ml        Physical Exam: Vital Signs Blood pressure (!) 117/92, pulse 94, temperature 98.6 F (37 C), temperature source Oral, resp. rate 19, height 5' 8 (1.727 m), weight 87.8 kg, SpO2 100%.   Constitutional: No distress . Vital signs reviewed. Sitting up in bed, mom and dad at bedside. HEENT: NCAT, EOMI, oral membranes moist Neck: supple Cardiovascular: RRR without m/r/g appreciated. No JVD     Respiratory/Chest: CTA Bilaterally without wheezes or rales. Normal effort, no coughing GI/Abdomen: soft, +BS throughout, non-tender, non-distended Ext: no clubbing, cyanosis, or edema Psych: pleasant and cooperative, maybe a little flat Skin: dry, warm    PRIOR EXAMS: Musculoskeletal:     Cervical back: Neck supple.     Comments: RUE- 4+ to 5-/5 throughout RLE- 4+/5 throughout- stronger than expected LUE- biceps and triceps 5-/5; WE 2-/5 but somewhat contracted and posturing still; Grip 4-/5 and FA 2+/5 LLE- HF 4-/5; KE 4-/5; KF 3-/5; DF 2-/5 and PF 2-/5   Poaturing and contracture of L elbow and wrist- both in flexion at rest.      Skin:    General: Skin is warm and dry.     Comments: R forearm IV- looks OK  Neurological:     Mental Status: He is alert.     Comments: Answers basically that he's blessed, and fine- but no other wording used- is very dysarthric Somewhat Ataxic with arms as well B/L Cannot test sensation due to pt's inability to help with this  Psychiatric:     Comments: Flat- sleepy; focused on game  Assessment/Plan: 1. Functional deficits which require 3+ hours per day of interdisciplinary therapy in a comprehensive inpatient rehab setting. Physiatrist is providing close team supervision and 24 hour management of active medical problems listed below. Physiatrist and rehab team continue to assess barriers to discharge/monitor patient progress toward functional and medical goals  Care Tool:  Bathing    Body parts bathed by patient: Left arm, Chest, Abdomen, Front perineal area, Right upper leg, Left upper leg, Face   Body parts bathed by helper: Right arm, Right lower leg, Left lower leg, Buttocks     Bathing assist Assist Level: Moderate Assistance - Patient 50 - 74%     Upper Body Dressing/Undressing Upper body dressing   What is the patient wearing?: Pull over shirt    Upper body assist Assist Level: Minimal Assistance - Patient > 75%     Lower Body Dressing/Undressing Lower body dressing      What is the patient wearing?: Underwear/pull up, Pants     Lower body assist Assist for lower body dressing: Maximal Assistance - Patient 25 - 49%     Toileting Toileting Toileting Activity did not occur (Clothing management and hygiene only): N/A (no void or bm)  Toileting assist Assist for toileting: Moderate Assistance - Patient 50 - 74%     Transfers Chair/bed transfer  Transfers assist     Chair/bed transfer assist level: Moderate Assistance - Patient 50 - 74%     Locomotion Ambulation   Ambulation assist      Assist level: Moderate Assistance - Patient 50 - 74% Assistive device: Hand held assist Max distance: 110   Walk 10 feet activity   Assist     Assist level: Moderate Assistance - Patient - 50 - 74% Assistive device: Hand held assist   Walk 50 feet activity   Assist    Assist level: Moderate Assistance - Patient - 50 - 74% Assistive device: Hand held assist    Walk 150 feet activity   Assist    Assist level: Moderate Assistance - Patient - 50 - 74% Assistive device: Hand held assist    Walk 10 feet on uneven surface  activity   Assist     Assist level: Moderate Assistance - Patient - 50 - 74% Assistive device: Hand held assist   Wheelchair     Assist Is the patient using a wheelchair?: Yes Type of Wheelchair: Manual    Wheelchair assist level: Dependent - Patient 0% Max wheelchair distance: 150    Wheelchair 50 feet with 2 turns activity    Assist        Assist Level: Dependent - Patient 0%   Wheelchair 150 feet activity     Assist      Assist Level: Dependent - Patient 0%   Blood pressure (!) 117/92, pulse 94, temperature 98.6 F (37 C), temperature source Oral, resp. rate 19, height 5' 8 (1.727 m), weight 87.8 kg, SpO2 100%.  Medical Problem List and Plan: 1. Functional deficits secondary to L inferior basal ganglia stroke (posterior  limb of internal capsule)             -patient may  shower             -ELOS/Goals: 14-18 days min A to mod I  -Continue CIR 2.  Antithrombotics: -DVT/anticoagulation:  Pharmaceutical: Lovenox  40mg  daily             -antiplatelet therapy: DAPT X 3 weeks followed by Plavix  alone.  3. Pain Management:  Tylenol  and flexeril  prn.  4. Mood/Behavior/Sleep: LCSW to follow for evaluation and support             --ativan  6mg  at nights for sleep/nightmares             -antipsychotic agents: N/A 5. Neuropsych/cognition: This patient is not fully capable of making decisions on his own behalf. 6. Skin/Wound Care: Routine pressure relief measures.  7. Fluids/Electrolytes/Nutrition: Monitor I/O. Routine labs. Continue Klor CR 10mEq BID 8. Panhypopituitarism: On hydrocortisone  5mg  daily, levothyroxine  100mcg daily, and DDAVP  0.2mg  TID-->followed by Dr. Faythe 9. Low grade fever:  CXR ordered for work up- if is negative, suggest U/A and Cx, since had 99.9 axillary temp last night -10/11/24 CXR neg, had another 99.9 reading last night, but none else, WBC today 5.7; will hold off on U/A and Cx for now, if WBC rise or further elevated temps, would proceed with this.  -10/12/24 no further temp elevations but mom wants U/A to r/o UTI-- ordered with reflex to Cx. F/up as available 10.  HTN: Monitor BP tid--on Losartan  25mg  daily  -11/29-30/25 BPs fine, monitor  Vitals:   10/10/24 1545 10/10/24 1613 10/10/24 2009 10/11/24 0640  BP: 126/79 126/79 123/64 105/78   10/11/24 1308 10/11/24 2102 10/12/24 0606  BP: (!) 138/90 126/71 (!) 117/92    11.  Hypernatremia: Sodium trending back up and DDAVP  increased to 2 mg TID today  -10/11/24 Na 142 this AM, monitor routinely 12. Leucocytosis: WBC of 10.6- Monitor for fevers and other signs of infection  -10/11/24 down to 5.7 today, monitor 13.  H/o Supersella germinoma: Hx of left hemiparesis, cognitive deficits and seizures in the past (no meds)  14. Mild OSA: Unable to  tolerate CPAP 15. Mild constipation- pt goes daily at home- might need to intervene if hasn't gone by tomorrow.  -10/11/24 no BM in 3 days, will give MoM 15ml once. Pt on Senokot S 2 tabs daily.  -10/12/24 LBM yesterday with MoM-- mom wants to leave meds alone though, monitor for further BMs 16. HLD: continue Rosuvastatin  20mg  daily    LOS: 2 days A FACE TO FACE EVALUATION WAS PERFORMED  [id number] 10/12/2024, 9:46 AM

## 2024-10-13 DIAGNOSIS — E230 Hypopituitarism: Secondary | ICD-10-CM

## 2024-10-13 DIAGNOSIS — G8194 Hemiplegia, unspecified affecting left nondominant side: Secondary | ICD-10-CM | POA: Diagnosis not present

## 2024-10-13 DIAGNOSIS — I639 Cerebral infarction, unspecified: Secondary | ICD-10-CM | POA: Diagnosis not present

## 2024-10-13 LAB — BASIC METABOLIC PANEL WITH GFR
Anion gap: 11 (ref 5–15)
BUN: 10 mg/dL (ref 6–20)
CO2: 25 mmol/L (ref 22–32)
Calcium: 8.6 mg/dL — ABNORMAL LOW (ref 8.9–10.3)
Chloride: 103 mmol/L (ref 98–111)
Creatinine, Ser: 0.99 mg/dL (ref 0.61–1.24)
GFR, Estimated: >60 mL/min (ref >60)
Glucose, Bld: 111 mg/dL — ABNORMAL HIGH (ref 70–99)
Potassium: 3.7 mmol/L (ref 3.5–5.1)
Sodium: 139 mmol/L (ref 135–145)

## 2024-10-13 LAB — CBC
HCT: 40.2 % (ref 39.0–52.0)
Hemoglobin: 13.2 g/dL (ref 13.0–17.0)
MCH: 30.2 pg (ref 26.0–34.0)
MCHC: 32.8 g/dL (ref 30.0–36.0)
MCV: 92 fL (ref 80.0–100.0)
Platelets: 154 K/uL (ref 150–400)
RBC: 4.37 MIL/uL (ref 4.22–5.81)
RDW: 15.2 % (ref 11.5–15.5)
WBC: 6.3 K/uL (ref 4.0–10.5)
nRBC: 0 % (ref 0.0–0.2)

## 2024-10-13 MED ORDER — ACETAMINOPHEN 325 MG PO TABS: 325.0000 mg | ORAL_TABLET | ORAL | Status: AC | PRN

## 2024-10-13 MED ORDER — DESMOPRESSIN ACETATE SPRAY 0.01 % NA SOLN
10.0000 ug | Freq: Four times a day (QID) | NASAL | Status: DC
  Administered 2024-10-13 – 2024-10-31 (×69): 10 ug via NASAL
  Filled 2024-10-13 (×4): qty 5

## 2024-10-13 NOTE — Progress Notes (Signed)
 Inpatient Rehabilitation Care Coordinator Assessment and Plan Patient Details  Name: Nicholas Holt MRN: [id number] Date of Birth: [birth year]  Today's Date: 10/13/2024  Hospital Problems: Principal Problem:   Left basal ganglia embolic stroke Nicholas Holt) Active Problems:   Small vessel stroke Nicholas Holt)  Past Medical History:  Past Medical History:  Diagnosis Date   Anxiety    Attention deficit disorder 08/08/2006   Bilateral leg cramps 11/25/2006   Blood transfusion without reported diagnosis 1997   Chemotherapy associated   Brain cancer (HCC) 1998   Cough 12/08/2015   COVID-19 10/16/2017   Elevated hemidiaphragm 07/09/2015   Left, presumably after portacath placement.    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  Complicated by left sided motor tic disorder and panhypopituitarism.    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  Includes central diabetes insipidus, secondary hypothyroidism, secondary adrenal insufficiency, and secondary hypogonadism.    Perennial allergic rhinitis 07/09/2015   Pre-diabetes    no meds   Preventative health care 07/09/2015   Seizure disorder (HCC) 08/08/2006   Predated brain tumor.  Generalized seizure ocurred after running out of lorazepam  post-tumor.    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 Treated with alendronate  from 2009-2016. DEXA (03/16/2008): L Spine T -2.0. L fem neck T  -0.9, R fem neck T -1.9.  DEXA (10/15/2014): L Spine T -1.3, L fem neck T -0.6, R fem neck T -1.8.  Bisphosphonate holiday 06/2015.  Repeat DEXA scan in 10/2016 to reassess.    Weakness 09/15/2023   Past Surgical History:  Past Surgical History:  Procedure Laterality Date   BRAIN BIOPSY  11/14/1995   CSF SHUNT  11/14/1995   PORTACATH PLACEMENT     portacath removal     RADIOLOGY WITH ANESTHESIA N/A 08/30/2021   Procedure: MRI WITH ANESTHESIA BRAIN WITH AND WITHOUT CONTRAST;  Surgeon: Radiologist, Medication, MD;  Location: [facility] OR;  Service: Radiology;  Laterality: N/A;   RADIOLOGY WITH ANESTHESIA N/A 05/25/2022   Procedure: MRI WITH ANESTHESIA OF BRAIN WITH AND WITHOUT CONTRAST;  Surgeon: Radiologist, Medication, MD;  Location: [facility] OR;  Service: Radiology;  Laterality: N/A;   RADIOLOGY WITH ANESTHESIA N/A 10/05/2024   Procedure: MRI WITH ANESTHESIA;  Surgeon: Radiologist, Medication, MD;  Location: [facility] OR;  Service: Radiology;  Laterality: N/A;   Social History:  reports that he has never smoked. He has never been exposed to tobacco smoke. He has never used smokeless tobacco. He reports that he does not drink alcohol and does not use drugs.  Family / Support Systems Marital Status: Single Patient Roles: Other (Comment) (son) Other Supports: Nicholas Holt [phone]  Nicholas Holt-Mom [phone] Anticipated Caregiver: Parents Ability/Limitations of Caregiver: Not able to lift Caregiver Availability: 24/7 Family Dynamics: Close with son and have been providing care since he was a teenager due to history of brain cancer. Dad assists with bathing and pt was mobile and could dress himself prior to admission. All have a close bond with one another. Parents are staying here with him.  Social History Preferred language: English Religion: Baptist Cultural Background: NA Education: Some schooling until became ill Health Literacy - How often do you need to have someone help you when you read instructions,  pamphlets, or other written material from your doctor or pharmacy?: Patient unable to respond Writes: No Employment Status: Disabled Date Retired/Disabled/Unemployed: 2007 Nicholas Holt Issues: NA Guardian/Conservator: Parents are his guardians-pt is not fully able to make decisions according to MD.   Abuse/Neglect Abuse/Neglect Assessment Can Be Completed: Unable to assess, patient is non-responsive or altered mental status  Patient response to: Social Isolation - How often do you feel lonely or isolated from those around you?: Patient unable to respond  Emotional Status Pt's affect, behavior and adjustment status: Pt listens and nods his head, not sure how much he understands. Parents are present and answer for him. He does his best and as much as he can for himself. This has been a set back and he is requiring more care now. All hope he will do well here on rehab and get back to his baseline Recent Psychosocial Issues: other health issues since brain cancer in 2007 and complications from this Psychiatric History: Hx-ADD and anxiety takes medications for this and parents report are helpful. Not sure would benefit from seeing neuro-psych while here will get input from team Substance Abuse History: NA  Patient / Family Perceptions, Expectations & Goals Pt/Family understanding of illness & functional limitations: Parents are able to explain his CVA and other health issues they have been with him through all of this and talk with the MD's involved and like to be kept updated on his medical issues. They stay here with him to provide emotional support Premorbid pt/family roles/activities: son, friend Anticipated changes in roles/activities/participation: resume Pt/family expectations/goals: Pt states:  I want to do well here.  Mom states:  We will help him but have our own limitations.  Community Resources [employer]: Other (Comment) (OP Rehab in the past) Premorbid  Home Care/DME Agencies: Other (Comment) (rw, tub bench) Transportation available at discharge: parents Is the patient able to respond to transportation needs?: Yes In the past 12 months, has lack of transportation kept you from medical appointments or from getting medications?: No (mom) In the past 12 months, has lack of transportation kept you from meetings, work, or from getting things needed for daily living?: No (mom)  Discharge Planning Living Arrangements: Parent Support Systems: Parent, Church/faith community Type of Residence: Private residence Insurance Resources: [employer] (specify) ([employer] Medicare Dual Complete) Financial Resources: SSI, Family Support Financial Screen Referred: No Living Expenses: Lives with family Money Management: Family Does the patient have any problems obtaining your medications?: No Home Management: parents Patient/Family Preliminary Plans: Return home with parents who did assist with bathing otherwise he could do his dressing and was mobile with a rolling walker. Will work on discharge needs and provide support Care Coordinator Barriers to Discharge: Decreased caregiver support Care Coordinator Anticipated Follow Up Needs: HH/OP  Clinical Impression Pleasant family who are very supportive of one another. Parents stay here with pt to provide support and see his progress. Parents are limited in the physical care they can provide son and hope he can get to a level where they can manage him. Will await team conference and work on discharge needs.  Raymonde Asberry MATSU 10/13/2024, 10:27 AM

## 2024-10-13 NOTE — Progress Notes (Signed)
 PROGRESS NOTE   Subjective/Complaints:  Slept ok, Mom asking about hydrocortisone  time of dose  ROS: as per HPI. Denies CP, SOB, abd pain, N/V/D, or any other complaints at this time.     Objective:   No results found.  Recent Labs    10/11/24 0536 10/13/24 0458  WBC 5.7 6.3  HGB 12.9* 13.2  HCT 39.5 40.2  PLT 160 154   Recent Labs    10/11/24 0536 10/13/24 0458  NA 142 139  K 4.1 3.7  CL 108 103  CO2 26 25  GLUCOSE 112* 111*  BUN 12 10  CREATININE 1.35* 0.99  CALCIUM  8.9 8.6*        Intake/Output Summary (Last 24 hours) at 10/13/2024 0820 Last data filed at 10/13/2024 9344 Gross per 24 hour  Intake 416 ml  Output 1925 ml  Net -1509 ml        Physical Exam: Vital Signs Blood pressure 122/84, pulse 80, temperature 97.7 F (36.5 C), resp. rate 18, height 5' 8 (1.727 m), weight 87.8 kg, SpO2 100%.  General: No acute distress Mood and affect are appropriate Heart: Regular rate and rhythm no rubs murmurs or extra sounds Lungs: Clear to auscultation, breathing unlabored, no rales or wheezes Abdomen: Positive bowel sounds, soft nontender to palpation, nondistended Extremities: No clubbing, cyanosis, or edema Skin: No evidence of breakdown, no evidence of rash    PRIOR EXAMS: Musculoskeletal:     Cervical back: Neck supple.     Comments: RUE- 4+ to 5-/5 throughout RLE- 4+/5 throughout- stronger than expected LUE- biceps and triceps 3-/5; WE 2-/5 but somewhat contracted with increased elbow and wrist flexor tone ; Grip 3-/5 and FA 2+/5 LLE- HF 3-/5; KE 4-/5; KF 3-/5; DF 2-/5 and PF 2-/5   Poaturing and contracture of L elbow and wrist- both in flexion at rest.   Alien arm, mild left,  Sensation to LT intact   Skin:    General: Skin is warm and dry.     Comments: R forearm IV- looks OK  Neurological:     Mental Status: He is alert.     Comments: is very dysarthric, difficult to understand   Somewhat Ataxic with arms as well B/L    Assessment/Plan: 1. Functional deficits which require 3+ hours per day of interdisciplinary therapy in a comprehensive inpatient rehab setting. Physiatrist is providing close team supervision and 24 hour management of active medical problems listed below. Physiatrist and rehab team continue to assess barriers to discharge/monitor patient progress toward functional and medical goals  Care Tool:  Bathing    Body parts bathed by patient: Left arm, Chest, Abdomen, Front perineal area, Right upper leg, Left upper leg, Face   Body parts bathed by helper: Right arm, Right lower leg, Left lower leg, Buttocks     Bathing assist Assist Level: Moderate Assistance - Patient 50 - 74%     Upper Body Dressing/Undressing Upper body dressing   What is the patient wearing?: Pull over shirt    Upper body assist Assist Level: Minimal Assistance - Patient > 75%    Lower Body Dressing/Undressing Lower body dressing      What is  the patient wearing?: Underwear/pull up, Pants     Lower body assist Assist for lower body dressing: Maximal Assistance - Patient 25 - 49%     Toileting Toileting Toileting Activity did not occur (Clothing management and hygiene only): N/A (no void or bm)  Toileting assist Assist for toileting: Moderate Assistance - Patient 50 - 74%     Transfers Chair/bed transfer  Transfers assist     Chair/bed transfer assist level: Moderate Assistance - Patient 50 - 74%     Locomotion Ambulation   Ambulation assist      Assist level: Moderate Assistance - Patient 50 - 74% Assistive device: Hand held assist Max distance: 110   Walk 10 feet activity   Assist     Assist level: Moderate Assistance - Patient - 50 - 74% Assistive device: Hand held assist   Walk 50 feet activity   Assist    Assist level: Moderate Assistance - Patient - 50 - 74% Assistive device: Hand held assist    Walk 150 feet  activity   Assist    Assist level: Moderate Assistance - Patient - 50 - 74% Assistive device: Hand held assist    Walk 10 feet on uneven surface  activity   Assist     Assist level: Moderate Assistance - Patient - 50 - 74% Assistive device: Hand held assist   Wheelchair     Assist Is the patient using a wheelchair?: Yes Type of Wheelchair: Manual    Wheelchair assist level: Dependent - Patient 0% Max wheelchair distance: 150    Wheelchair 50 feet with 2 turns activity    Assist        Assist Level: Dependent - Patient 0%   Wheelchair 150 feet activity     Assist      Assist Level: Dependent - Patient 0%   Blood pressure 122/84, pulse 80, temperature 97.7 F (36.5 C), resp. rate 18, height 5' 8 (1.727 m), weight 87.8 kg, SpO2 100%.  Medical Problem List and Plan: 1. Functional deficits secondary to L inferior basal ganglia stroke (posterior limb of internal capsule)             -patient may  shower             -ELOS/Goals: 14-18 days min A to mod I  -Continue CIR Team conf in am  2.  Antithrombotics: -DVT/anticoagulation:  Pharmaceutical: Lovenox  40mg  daily             -antiplatelet therapy: DAPT X 3 weeks followed by Plavix  alone.  3. Pain Management: Tylenol  and flexeril  prn.  4. Mood/Behavior/Sleep: LCSW to follow for evaluation and support             --ativan  6mg  at nights for sleep/nightmares             -antipsychotic agents: N/A 5. Neuropsych/cognition: This patient is not fully capable of making decisions on his own behalf. 6. Skin/Wound Care: Routine pressure relief measures.  7. Fluids/Electrolytes/Nutrition: Monitor I/O. Routine labs. Continue Klor CR 10mEq BID 8. Panhypopituitarism: On hydrocortisone  5mg  daily, levothyroxine  100mcg daily, and DDAVP  0.2mg  TID-->followed by Dr. Faythe 9. Low grade fever: resolved CXR and UA neg 10.  HTN: Monitor BP tid--on Losartan  25mg  daily  -12/2 BPs fine, monitor  Vitals:   10/10/24 1545  10/10/24 1613 10/10/24 2009 10/11/24 0640  BP: 126/79 126/79 123/64 105/78   10/11/24 1308 10/11/24 2102 10/12/24 0606 10/12/24 1435  BP: (!) 138/90 126/71 (!) 117/92 124/65   10/12/24  2037 10/13/24 0640  BP: 114/70 122/84    11.  Hypernatremia: Resolved Sodium trending back up and DDAVP  increased to 2 mg TID today, Mom prefers intranasal route for pt thinks it controls urination better , will change to home dose and monitor 0.1 QID, (sometimes used .2mg  at noc)       Latest Ref Rng & Units 10/13/2024    4:58 AM 10/11/2024    5:36 AM 10/10/2024    5:36 AM  BMP  Glucose 70 - 99 mg/dL 888  887  98   BUN 6 - 20 mg/dL 10  12  13    Creatinine 0.61 - 1.24 mg/dL 9.00  8.64  8.80   Sodium 135 - 145 mmol/L 139  142  146   Potassium 3.5 - 5.1 mmol/L 3.7  4.1  3.9   Chloride 98 - 111 mmol/L 103  108  111   CO2 22 - 32 mmol/L 25  26  25    Calcium  8.9 - 10.3 mg/dL 8.6  8.9  9.2   Recheck BMP in am   12. Leucocytosis: WBC of 10.6- Monitor for fevers and other signs of infection  -10/11/24 down to 5.7 today, monitor 13.  H/o Supersella germinoma: Hx of left hemiparesis, cognitive deficits and seizures in the past (no meds)  14. Mild OSA: Unable to tolerate CPAP 15. Mild constipation- pt goes daily at home- might need to intervene if hasn't gone by tomorrow.  -10/11/24 no BM in 3 days, will give MoM 15ml once. Pt on Senokot S 2 tabs daily.  -10/12/24 LBM yesterday with MoM-- mom wants to leave meds alone though, monitor for further BMs 16. HLD: continue Rosuvastatin  20mg  daily    LOS: 3 days A FACE TO FACE EVALUATION WAS PERFORMED  Prentice FORBES Compton 10/13/2024, 8:20 AM

## 2024-10-13 NOTE — Progress Notes (Addendum)
 Speech Language Pathology Daily Session Note  Patient Details  Name: Nicholas Holt MRN: [id number] Date of Birth: 10-21-1981  Today's Date: 10/13/2024 SLP Individual Time: 1433-1530 SLP Individual Time Calculation (min): 57 min  Short Term Goals: Week 1: SLP Short Term Goal 1 (Week 1): Pt will utilize compensatory speech strategies at phrase level to maintain 75% intelligibility w/ maxA cues SLP Short Term Goal 2 (Week 1): Pt will utilize compensatory speech strategies at word level to maintain 85% intelligibility w/ maxA cues SLP Short Term Goal 3 (Week 1): Pt will utilize external memory aids to recall recent/relevant info w/ maxA SLP Short Term Goal 4 (Week 1): Pt will utilize external memory aids to be AOx3 w/ modA SLP Short Term Goal 5 (Week 1): Pt will solve functional problems w/ maxA  Skilled Therapeutic Interventions:   SLP conducted skilled therapy session targeting communication and cognitive goals. Upon SLP entry, patient sitting in chair with family present. SLP introduced memory book to enhance functional recall. Pt recalled times of therapy session, activities completed during therapy, and therapist given min verbal cues and visual aid. Clinician facilitated a speech intelligibility task where patient verbalized sentences containing initial /p/ and /b/. With context, patient ~75% intelligible characterized by uncoordinated speech and breath pattern with vocal strain requiring min cues to reduce rate, increase volume, and pause between utterances across 20/20 trials. SLP then conducted a verbal reasoning task where pt named 10 words beginning with /p/, /b/ and /m/ given modA to generate responses. Last ~59min was used to fill in memory book with speech therapy tasks then target speech intelligibility at the conversation level where pt identified favorite meals from restaurants with ~60% intelligibility given max cues to overarticulate and reduce rate. Note Patient continent of bladder  at end of session where pts mom gave totalA with toileting using urinal. Patient initially impulsive with wanting to stand up and ambulate to commode requiring clinician cues to remain seated and utilize urinal. Patient was left in room with call bell in reach and family present. SLP will continue to target goals per plan of care.     Pain   None endorsed  Therapy/Group: Individual Therapy  Nicholas Holt 10/13/2024, 4:43 PM

## 2024-10-13 NOTE — Discharge Instructions (Addendum)
 Inpatient Rehab Discharge Instructions  MARQUIZ SOTELO Discharge date and time:    Activities/Precautions/ Functional Status: Activity: no lifting, driving, or strenuous exercise till cleared by MD Diet: regular diet Wound Care: none needed   Functional status:  ___ No restrictions     ___ Walk up steps independently _X__ 24/7 supervision/assistance   ___ Walk up steps with assistance ___ Intermittent supervision/assistance  ___ Bathe/dress independently ___ Walk with walker     ___ Bathe/dress with assistance ___ Walk Independently    ___ Shower independently ___ Walk with assistance    _X__ Shower with assistance _X__ No alcohol     ___ Return to work/school ________   Continue aspirin  81 mg daily until 11/01/2024 and stop    COMMUNITY REFERRALS UPON DISCHARGE:    Home Health:   PT   OT    SP                Agency: [facility]    Phone:    Medical Equipment/Items Ordered:HAS ALL NEEDED EQUIPMENT FORM PAST ADMISSIONS                                                 Agency/Supplier:NA  [employer]-[phone] GAVE INFORMATION TO PARENTS [facility]-[phone]  DAY [facility] PROGRAMS AND OTHER SERVICES    My questions have been answered and I understand these instructions. I will adhere to these goals and the provided educational materials after my discharge from the hospital.  Patient/Caregiver Signature _______________________________ Date __________  Clinician Signature _______________________________________ Date __________  Please bring this form and your medication list with you to all your follow-up doctor's appointments.

## 2024-10-13 NOTE — Telephone Encounter (Addendum)
 LVM made mom (checked DPR)  aware to call back when patient completes rehab and reschedule initial f/u visit . Made DME aware.

## 2024-10-13 NOTE — Telephone Encounter (Signed)
 Patient's mother notified patient was in the hospitalized, now in rehabilitation at [facility]. He will not be able to come to appointment between  08/28/24-10/27/24 for initial CPAP.SABRA Would like a call back to know what to do.

## 2024-10-13 NOTE — Progress Notes (Signed)
 Physical Therapy Session Note  Patient Details  Name: Nicholas Holt MRN: [id number] Date of Birth: 1981-09-07  Today's Date: 10/13/2024 PT Individual Time: 8696-8584 PT Individual Time Calculation (min): 72 min   Short Term Goals: Week 1:  PT Short Term Goal 1 (Week 1): Pt will transfer sup to sit w/ min A consistently from flat bed. PT Short Term Goal 2 (Week 1): Pt will transfer sit to stand w/ min A consistently. PT Short Term Goal 3 (Week 1): Pt will amb x 120' w/ min A and LRAD PT Short Term Goal 4 (Week 1): Pt will negotiate 4 steps w/ 1 rail and min A.  Skilled Therapeutic Interventions/Progress Updates:     PT arrives with pt seated in recliner, agreeable to therapy with family at bedside. Pt reports no pain.  Pt performs sit>stand with modA for initiation and completes stand-pivot transfer to [facility] with modA for balance support. PT wheels pt to main gym into parallel bars. Pt wears solid ankle AFO on LLE daily, reports having worn it for 3+ years. Pt completes 2 gait trials in parallel bars with BUE support and PT providing modA for balance and increased verbal/tactile cueing for L knee drive to avoid toe drag. Seated rest breaks were provided as necessary.   Pt ambulated 116ft using EVA walker with 2+ assist to provide tactile cueing on LLE for knee drive and heel strike and to monitor walker speed. Pt is not aware of toe drag or walker speed, also has difficulty gripping with LUE. To assists with toe drag, PT tied red theraband around midsole of L foot and to EVA walker. Pt completed another 161ft with decreased to drag. Seated rest breaks were provided as necessary.   Pt ambulated 30ft with HHA from PT. PT instructed pt to kick me with LLE to exaggerate step through pattern and emphasize heel strike. Pt asc/desc 4-6in stair x2 using B hand rails and modA with consistent verbal cueing leading with RLE when asc and LLE when desc using step-to gait pattern. Seated rest breaks were  provided as necessary.   Pt ambulated ~77ft from main gym to room with HHA and kick me VC provided with each step d/t pt being easily distracted. Pt completed stand>sit transfer with modA, verbal cueing for sequencing and hand placement. Pt left seated in recliner with family at bedside.   Therapy Documentation Precautions:  Precautions Precautions: Fall Recall of Precautions/Restrictions: Impaired Precaution/Restrictions Comments: Left AFO, h/o left hemiplegia Restrictions Weight Bearing Restrictions Per Provider Order: No  Therapy/Group: Individual Therapy  Aubrina Nieman SPT 10/13/2024, 3:11 PM

## 2024-10-13 NOTE — Progress Notes (Signed)
 Inpatient Rehabilitation  Patient information reviewed and entered into eRehab system by Burnard Mealing, OTR/L, Rehab Quality Coordinator.   Information including medical coding, functional ability and quality indicators will be reviewed and updated through discharge.

## 2024-10-13 NOTE — IPOC Note (Signed)
 Overall Plan of Care [facility]) Patient Details Name: Nicholas Holt MRN: [id number] DOB: 12-12-1980  Admitting Diagnosis: Left basal ganglia embolic stroke [facility])  Hospital Problems: Principal Problem:   Left basal ganglia embolic stroke [facility]) Active Problems:   Small vessel stroke (HCC)     Functional Problem List: Nursing Bowel, Endurance, Medication Management, Safety, Motor  PT Balance, Safety, Endurance, Motor  OT Balance, Perception, Behavior, Safety, Cognition, Sensory, Endurance, Motor  SLP Cognition, Linguistic  TR         Basic ADL's: OT Eating, Grooming, Bathing, Dressing, Toileting     Advanced  ADL's: OT None     Transfers: PT Bed Mobility, Bed to Chair, Car, Occupational Psychologist, Research Scientist (life Sciences): PT Ambulation, Stairs     Additional Impairments: OT Fuctional Use of Upper Extremity  SLP Social Cognition   Problem Solving, Memory  TR      Anticipated Outcomes Item Anticipated Outcome  Self Feeding Supervision/ Set up  Swallowing      Basic self-care  Min assist  Toileting  Min assist   Bathroom Transfers Min assist  Bowel/Bladder  manage bowel w mod I assist  Transfers  min/CGA  Locomotion  min CGA w/ LRAD  Communication  modA  Cognition  modA  Pain  n/a  Safety/Judgment  manage safety w cues   Therapy Plan: PT Intensity: Minimum of 1-2 x/day ,45 to 90 minutes PT Frequency: 5 out of 7 days PT Duration Estimated Length of Stay: 2 weeks OT Intensity: Minimum of 1-2 x/day, 45 to 90 minutes OT Frequency: 5 out of 7 days OT Duration/Estimated Length of Stay: 14-16 days SLP Intensity: Minumum of 1-2 x/day, 30 to 90 minutes SLP Frequency: 3 to 5 out of 7 days SLP Duration/Estimated Length of Stay: 2 weeks   Team Interventions: Nursing Interventions Bowel Management, Medication Management, Discharge Planning, Disease Management/Prevention, Patient/Family Education, Cognitive Remediation/Compensation  PT interventions  Ambulation/gait training, Community reintegration, Neuromuscular re-education, Stair training, UE/LE Strength taining/ROM, UE/LE Coordination activities, Therapeutic Activities, Discharge planning, Warden/ranger, Functional mobility training, Patient/family education, Therapeutic Exercise  OT Interventions Balance/vestibular training, Discharge planning, Functional electrical stimulation, Self Care/advanced ADL retraining, Therapeutic Activities, UE/LE Coordination activities, Cognitive remediation/compensation, Functional mobility training, Patient/family education, Skin care/wound managment, Therapeutic Exercise, Visual/perceptual remediation/compensation, DME/adaptive equipment instruction, Neuromuscular re-education, Psychosocial support, Splinting/orthotics, UE/LE Strength taining/ROM, Wheelchair propulsion/positioning  SLP Interventions Cognitive remediation/compensation, Environmental controls, Cueing hierarchy, Functional tasks, Therapeutic Activities, Internal/external aids, Patient/family education, Speech/Language facilitation  TR Interventions    SW/CM Interventions Discharge Planning, Psychosocial Support, Patient/Family Education   Barriers to Discharge MD  Medical stability  Nursing Decreased caregiver support, Home environment access/layout 2 level /2 ste right rail, B+B upstairs;walks with RW and AFO at baseline, assist for some ADLs, home with 24/7 supevision since brain ca. Patient limited by left sided weakness with dysarthria, has significant posterior bias and right lateral lean needing multimodal cues to self correct with activity, has decreased insight with poor awareness of deficits and is working on pre-gait activity.  PTA: He was independent without AD. Father assisted with ADL. Mother sets out meds.  PT Home environment access/layout, Decreased caregiver support father hurt his back assisting pt .  OT      SLP      SW Decreased caregiver support     Team  Discharge Planning: Destination: PT-Home ,OT- Home , SLP-Home Projected Follow-up: PT-Home health PT, OT-  Home health OT, Outpatient OT, SLP-Outpatient SLP, 24 hour supervision/assistance Projected Equipment Needs:  PT-To be determined, OT- To be determined, SLP-None recommended by SLP Equipment Details: PT- , OT-  Patient/family involved in discharge planning: PT- Patient, Family member/caregiver,  OT-Family member/caregiver, Patient, SLP-Family member/caregiver  MD ELOS: 14-16d Medical Rehab Prognosis:  Good Assessment: The patient has been admitted for CIR therapies with the diagnosis of Left Basal ganglia stroke . The team will be addressing functional mobility, strength, stamina, balance, safety, adaptive techniques and equipment, self-care, bowel and bladder mgt, patient and caregiver education, Management of diabetes insipidus. Goals have been set at [facility]. Anticipated discharge destination is Home with family assist .        See Team Conference Notes for weekly updates to the plan of care

## 2024-10-13 NOTE — Progress Notes (Signed)
 Inpatient Rehabilitation Center Individual Statement of Services  Patient Name:  Nicholas Holt  Date:  10/13/2024  Welcome to the Inpatient Rehabilitation Center.  Our goal is to provide you with an individualized program based on your diagnosis and situation, designed to meet your specific needs.  With this comprehensive rehabilitation program, you will be expected to participate in at least 3 hours of rehabilitation therapies Monday-Friday, with modified therapy programming on the weekends.  Your rehabilitation program will include the following services:  Physical Therapy (PT), Occupational Therapy (OT), Speech Therapy (ST), 24 hour per day rehabilitation nursing, Therapeutic Recreaction (TR), Neuropsychology, Care Coordinator, Rehabilitation Medicine, Nutrition Services, and Pharmacy Services  Weekly team conferences will be held on Wednesday to discuss your progress.  Your Inpatient Rehabilitation Care Coordinator will talk with you frequently to get your input and to update you on team discussions.  Team conferences with you and your family in attendance may also be held.  Expected length of stay: 2 weeks  Overall anticipated outcome: over all min assist level  Depending on your progress and recovery, your program may change. Your Inpatient Rehabilitation Care Coordinator will coordinate services and will keep you informed of any changes. Your Inpatient Rehabilitation Care Coordinator's name and contact numbers are listed  below.  The following services may also be recommended but are not provided by the Inpatient Rehabilitation Center:   Home Health Rehabiltiation Services Outpatient Rehabilitation Services    Arrangements will be made to provide these services after discharge if needed.  Arrangements include referral to agencies that provide these services.  Your insurance has been verified to be:  [facility] Dual Complete Your primary doctor is:  Nicholas Holt  Pertinent  information will be shared with your doctor and your insurance company.  Inpatient Rehabilitation Care Coordinator:  Rhoda Clement, KEN [phone] or ELIGAH BASQUES  Information discussed with and copy given to patient by: Clement Asberry MATSU, 10/13/2024, 10:11 AM

## 2024-10-13 NOTE — Progress Notes (Signed)
 Occupational Therapy Session Note  Patient Details  Name: Nicholas Holt MRN: [id number] Date of Birth: February 29, 1981  Today's Date: 10/13/2024 OT Individual Time: 9164-9069 OT Individual Time Calculation (min): 55 min    Short Term Goals: Week 1:  OT Short Term Goal 1 (Week 1): Patient will sit unsupported at edge of bed x 15 seconds as needed to prepare for level surface transfer to chair. OT Short Term Goal 2 (Week 1): Patient will sufficiently lean forward while seated to safely reach to feet during bathing. OT Short Term Goal 3 (Week 1): Patient will transition to standing from sitting with min assist as needed to pull up/down clothing during BADL.  Skilled Therapeutic Interventions/Progress Updates:    Pt received in recliner with family present.  Pt agreeable to washing up at sink (declined shower),  pt's recliner moved to sink. Pt used L arm actively to hold wash cloth, soap and even deoderant to reach under R arm.  No A needed for UB bathing, just cues for thoroughness.   To reach bottom, had pt do a sit to stand to sink.  Max cues for feet placement, forward lean, pushing up with R hand and sequencing steps.  Once steps put together, pt only needed mod A progress to min A throughout the session.  In standing., posterior lean initially needing max A to balance, progressing to mod A with cues and eventually pt able to hold static balance with CGA for a few seconds at a time. Does best with cues put weight on your toes.  Lean chest forward  Pt able to don pants over feet with min A R foot for underwear and with pants cued pt to  drop pants after placing on L foot and then putting foot inside waist band then reaching down for pants.   Pt able to pull pants over hips.    Rest of session  focused on PROM and AROM of LUE and forward lean in sitting.  Sit to stands using bedrail on bed and standing balance with ant/posterior weight shifts.    Pt resting in recliner with family  present.   Therapy Documentation Precautions:  Precautions Precautions: Fall Recall of Precautions/Restrictions: Impaired Precaution/Restrictions Comments: Left AFO, h/o left hemiplegia Restrictions Weight Bearing Restrictions Per Provider Order: No    Vital Signs: Therapy Vitals Temp: 97.7 F (36.5 C) Pulse Rate: 80 Resp: 18 BP: 122/84 Patient Position (if appropriate): Lying Oxygen Therapy SpO2: 100 % O2 Device: Room Air Pain: Pain Assessment Pain Score: 0-No pain ADL: ADL Eating: Not assessed Grooming: Minimal assistance Where Assessed-Grooming: Sitting at sink Upper Body Bathing: Supervision/safety Where Assessed-Upper Body Bathing: Sitting at sink Lower Body Bathing: Minimal assistance Where Assessed-Lower Body Bathing: Standing at sink Upper Body Dressing: Supervision/safety Where Assessed-Upper Body Dressing: Sitting at sink Lower Body Dressing: Moderate assistance Where Assessed-Lower Body Dressing: Sitting at sink, Standing at sink Toileting: Unable to assess Toilet Transfer: Unable to assess Toilet Transfer Method: Unable to assess Tub/Shower Transfer: Unable to assess Tub/Shower Transfer Method: Unable to assess Film/video Editor: Unable to assess [employer] Transfer Method: Unable to assess ADL Comments: STRONG posterior lean in sitting and standing    Therapy/Group: Individual Therapy  Taleia Sadowski 10/13/2024, 9:56 AM

## 2024-10-14 MED ORDER — HYDROCORTISONE 5 MG PO TABS
5.0000 mg | ORAL_TABLET | Freq: Every day | ORAL | Status: DC
  Administered 2024-10-15 – 2024-10-31 (×17): 5 mg via ORAL
  Filled 2024-10-14 (×17): qty 1

## 2024-10-14 NOTE — Progress Notes (Signed)
 Physical Therapy Session Note  Patient Details  Name: Nicholas Holt MRN: [id number] Date of Birth: 07-20-1981  Today's Date: 10/14/2024 PT Individual Time: 0950-1100 PT Individual Time Calculation (min): 70 min   Short Term Goals: Week 1:  PT Short Term Goal 1 (Week 1): Pt will transfer sup to sit w/ min A consistently from flat bed. PT Short Term Goal 2 (Week 1): Pt will transfer sit to stand w/ min A consistently. PT Short Term Goal 3 (Week 1): Pt will amb x 120' w/ min A and LRAD PT Short Term Goal 4 (Week 1): Pt will negotiate 4 steps w/ 1 rail and min A.  Skilled Therapeutic Interventions/Progress Updates: Patient sitting in [facility] with family present on entrance to room. Patient alert and agreeable to PT session.   Patient reported no pain during session. Beginning of session focused on building pt and family rapport (end of session PTA discussed with pt and pt family about pt's posterior weight bias and postural control and rationale for interventions performed.  Therapeutic Activity: Transfers: Pt performed sit<>stand pivot transfer with L forearm hold and overall mod/heavy modA. Provided VC for step sequence and to increase step length.  Neuromuscular Re-ed: NMR facilitated during session with focus on static sitting balance, dynamic standing balance, weight shift, memory recall. - PTA passively flexed L hip while seated with cues for pt to control eccentric  - Pt cued to sit edge of mat for 5 minutes wit max cuing to maintain B feet flat on floor, and to shift weight forward (posterior lean bias). Pt with close supervision/CGA but able to correct to upright/forward posture following cues. - Pt performed sit<>stand from mat with chair in front for pt to lean forward with only one rep of decreased retropulsion.  - Pt standing without UE support with cues to reach forward to called out color cones on table to improve forward weight shift due to posterior bias. Pt required totalA to  prevent posterior LOB (cued to press front of foot into floor).  - pt progressed having to recall called out sequence (touch top of cone in order). Pt able to recall up to 4 colors with only few instances of needing VC to recall correct sequence. Pt had few moments of ability to stand with weight in center (CGA) vs posteriorly, but mostly required totalA to prevent posterior LOB when hands returned by pt's side.  - Pt standing while playing connect four on table using R UE with fluctuating of CGA-maxA to prevent posterior LOB with cues throughout to maintain anterior weight shift  NMR performed for improvements in motor control and coordination, balance, sequencing, judgement, and self confidence/ efficacy in performing all aspects of mobility at highest level of independence.   Patient sitting in WC at end of session with brakes locked, family, and all needs within reach.      Therapy Documentation Precautions:  Precautions Precautions: Fall Recall of Precautions/Restrictions: Impaired Precaution/Restrictions Comments: Left AFO, h/o left hemiplegia Restrictions Weight Bearing Restrictions Per Provider Order: No   Therapy/Group: Individual Therapy  Kiara Keep PTA 10/14/2024, 12:07 PM

## 2024-10-14 NOTE — Progress Notes (Signed)
 Occupational Therapy Session Note  Patient Details  Name: Nicholas Holt MRN: [id number] Date of Birth: 28-Jan-1981  Today's Date: 10/14/2024 OT Individual Time: 9169-9084 OT Individual Time Calculation (min): 45 min    Short Term Goals: Week 1:  OT Short Term Goal 1 (Week 1): Patient will sit unsupported at edge of bed x 15 seconds as needed to prepare for level surface transfer to chair. OT Short Term Goal 2 (Week 1): Patient will sufficiently lean forward while seated to safely reach to feet during bathing. OT Short Term Goal 3 (Week 1): Patient will transition to standing from sitting with min assist as needed to pull up/down clothing during BADL.  Skilled Therapeutic Interventions/Progress Updates:    Pt received in recliner with parents present. His mother assisted him with getting washed up this am and changed.    Pt ready to go to the gym.  Pt worked on sit to stand from [employer] with cues for positioning and for a full lean forward needing mod A to bring shoulder fully forward. He then rose to stand with min A. Used RW to stand pivot mod A but unable to maintain grasp with L hand.  A walker splint will not be sufficient as he has mod hypertone in L wrist flexors.   Pt taken to gym to experiment with a hemiwalker.  Pt state he hates the [facility] but he did not use one before. Reassured him it is just a temporary device to help him get back to his PLOF.  Pt then agreeable.  From w/c had pt sit to stand with min A and mod cues for big forward lean.  Once standing had him place R hand on HW and step pivot to mat with mod A and cues for sequencing.  Spent time sitting on mat working on various exercises to work on forward flexion from the hips to prepare for sit to stands. Pt able to come forward without A but only able to hold position or upright static sit position for 2-3 seconds at a time. Unsure if it is core weakness or poor attention or both.  Sit to stand from mat pushing up from  mat and then working on static stand with R hand on HW. Pt has posterior lean but able to find neutral with cues but again can only hold for a few seconds at a time.    Sat to rest and then worked on step pivot with HW to L side to wc.   Took pt to supply closet to try out a few back rests, found one to encourage upright sit as he leans back in wc over low back rest.   Pt returned to room to sit with family.  Pt is very verbal, and tends to repeat same information over and over but he is very difficult to understand.    Therapy Documentation Precautions:  Precautions Precautions: Fall Recall of Precautions/Restrictions: Impaired Precaution/Restrictions Comments: Left AFO, h/o left hemiplegia Restrictions Weight Bearing Restrictions Per Provider Order: No    Vital Signs: Therapy Vitals Temp: 98.6 F (37 C) Temp Source: Oral Pulse Rate: 80 Resp: 16 BP: (!) 97/59 Patient Position (if appropriate): Lying Oxygen Therapy SpO2: 100 % O2 Device: Room Air Pain: Pain Assessment Pain Score: 0-No pain ADL: ADL Eating: Not assessed Grooming: Minimal assistance Where Assessed-Grooming: Sitting at sink Upper Body Bathing: Supervision/safety Where Assessed-Upper Body Bathing: Sitting at sink Lower Body Bathing: Minimal assistance Where Assessed-Lower Body Bathing: Standing at sink Upper Body  Dressing: Supervision/safety Where Assessed-Upper Body Dressing: Sitting at sink Lower Body Dressing: Moderate assistance Where Assessed-Lower Body Dressing: Sitting at sink, Standing at sink Toileting: Unable to assess Toilet Transfer: Unable to assess Toilet Transfer Method: Unable to assess Tub/Shower Transfer: Unable to assess Tub/Shower Transfer Method: Unable to assess Film/video Editor: Unable to assess [employer] Transfer Method: Unable to assess ADL Comments: STRONG posterior lean in sitting and standing   Therapy/Group: Individual Therapy  Herve Haug 10/14/2024,  9:32 AM

## 2024-10-14 NOTE — Progress Notes (Signed)
 Patient ID: Nicholas Holt, male   DOB: [birth year], 43 y.o.   MRN: [id number] Met with the patient and parents to review current medical situation post left [facility] with multi lacunar infarcts, rehab process, team conference and plan of care.  Reviewed medications, including DAPT x 3 weeks then Plavix  solo.  Reviewed dietary modification for HH diet. Assisted with MyChart access set up. Continue to follow along to address educational needs to facilitate preparation for discharge.  Fredericka Barnie NOVAK

## 2024-10-14 NOTE — Progress Notes (Signed)
 Occupational Therapy Session Note  Patient Details  Name: Nicholas Holt MRN: [id number] Date of Birth: Apr 30, 1981  Session 1 Today's Date: 10/14/2024 OT Individual Time: 8870-8794 OT Individual Time Calculation (min): 36 min  and Today's Date: 10/14/2024 OT Missed Time: 9 Minutes Missed Time Reason: Patient fatigue   Session 2 Today's Date: 10/14/2024 OT Individual Time: 8645-8570 OT Individual Time Calculation (min): 35 min    Short Term Goals: Week 1:  OT Short Term Goal 1 (Week 1): Patient will sit unsupported at edge of bed x 15 seconds as needed to prepare for level surface transfer to chair. OT Short Term Goal 2 (Week 1): Patient will sufficiently lean forward while seated to safely reach to feet during bathing. OT Short Term Goal 3 (Week 1): Patient will transition to standing from sitting with min assist as needed to pull up/down clothing during BADL.   Skilled Therapeutic Interventions/Progress Updates:    Session 1 Pt received supine with no c/o pain, agreeable to OT session. Pt was taken via w/c to the therapy gym for time management. Stand pivot to the mat with mod A. He worked on dynamic sitting balance using the BITS wearable sensor to provide visual feedback. Changed parameters to focus mainly on forward weight shift, which was very difficult for pt 2/2 posterior bias. He required min facilitation at the trunk and mod verbal cueing for following target. He enjoyed activity but it was very fatiguing. He completed one more functional reaching based activity on the BITS with the screen placed far out of BOS so he was forced to lean very far forward to challenge pelvis rotation and trunk translation forward. He required mod cueing and CGA to trust his balance leaning forward, but no LOB. He reported fatigue and asked to be done with session. Pt returned to their room following. Pt was left sitting up in the wheelchair with all needs met, chair alarm set, and call bell within  reach.   Session 2 Pt received supine with no c/o pain and agreeable to OT session. Pt was taken via w/c to the therapy gym for time management. Stand pivot to the mat with min A. He stood from [facility] and worked on forward reaching in standing with facilitation for bending at the waist to promote forward trunk to reduce posterior bias. Mod A overall. He was then taken to the dayroom via w/c to play Wii Bowling (pt expressed bowling as an interest). He stood for 5 sets of bowling with min-mod A required posteriorly with frequent cueing to put his toes down and bring trunk forward. He had some difficulty sequencing the coordination of bowling but really enjoy the activity. He returned to his room and was left sitting up with all needs met. Family present.     Therapy Documentation Precautions:  Precautions Precautions: Fall Recall of Precautions/Restrictions: Impaired Precaution/Restrictions Comments: Left AFO, h/o left hemiplegia Restrictions Weight Bearing Restrictions Per Provider Order: No   Therapy/Group: Individual Therapy  [name] 10/14/2024, 11:47 AM

## 2024-10-14 NOTE — Plan of Care (Signed)
  Problem: Consults Goal: RH STROKE PATIENT EDUCATION Description: See Patient Education module for education specifics  Outcome: Progressing   Problem: RH BOWEL ELIMINATION Goal: RH STG MANAGE BOWEL WITH ASSISTANCE Description: STG Manage Bowel with mod I Assistance. Outcome: Progressing Goal: RH STG MANAGE BOWEL W/MEDICATION W/ASSISTANCE Description: STG Manage Bowel with Medication with mod I Assistance. Outcome: Progressing   Problem: RH SAFETY Goal: RH STG ADHERE TO SAFETY PRECAUTIONS W/ASSISTANCE/DEVICE Description: STG Adhere to Safety Precautions With cues Assistance/Device. Outcome: Progressing   Problem: RH COGNITION-NURSING Goal: RH STG USES MEMORY AIDS/STRATEGIES W/ASSIST TO PROBLEM SOLVE Description: STG Uses Memory Aids/Strategies With cues Assistance to Problem Solve. Outcome: Progressing   Problem: RH KNOWLEDGE DEFICIT Goal: RH STG INCREASE KNOWLEDGE OF HYPERTENSION Description: Patient and parents will be able to manage HTN using educational resources for medications and dietary modification independently Outcome: Progressing Goal: RH STG INCREASE KNOWLEGDE OF HYPERLIPIDEMIA Description: Patient and parents will be able to manage HLD using educational resources for medications and dietary modification independently Outcome: Progressing Goal: RH STG INCREASE KNOWLEDGE OF STROKE PROPHYLAXIS Description: Patient and parents will be able to manage secondary risks using educational resources for medications and dietary modification independently Outcome: Progressing

## 2024-10-15 DIAGNOSIS — G8194 Hemiplegia, unspecified affecting left nondominant side: Secondary | ICD-10-CM | POA: Diagnosis not present

## 2024-10-15 DIAGNOSIS — E230 Hypopituitarism: Secondary | ICD-10-CM | POA: Diagnosis not present

## 2024-10-15 DIAGNOSIS — I639 Cerebral infarction, unspecified: Secondary | ICD-10-CM | POA: Diagnosis not present

## 2024-10-15 NOTE — Progress Notes (Signed)
 Speech Language Pathology Daily Session Note  Patient Details  Name: [name] MRN: [id number] Date of Birth: 1980-12-05  Today's Date: 10/15/2024 SLP Individual Time: 9050-8980 SLP Individual Time Calculation (min): 30 min  Short Term Goals: Week 1: SLP Short Term Goal 1 (Week 1): Pt will utilize compensatory speech strategies at phrase level to maintain 75% intelligibility w/ maxA cues SLP Short Term Goal 2 (Week 1): Pt will utilize compensatory speech strategies at word level to maintain 85% intelligibility w/ maxA cues SLP Short Term Goal 3 (Week 1): Pt will utilize external memory aids to recall recent/relevant info w/ maxA SLP Short Term Goal 4 (Week 1): Pt will utilize external memory aids to be AOx3 w/ modA SLP Short Term Goal 5 (Week 1): Pt will solve functional problems w/ maxA  Skilled Therapeutic Interventions:  Pt was seen in am to address speech intelligibility and cognitive re- training. Pt was alert and seated upright in WC upon SLP arrival. He denied pain and was agreeable for session. Pt identified month, year, and day of week through indep use of external aid located in his room. He identified previous sessions via therapy schedule with mod A and subsequently recalled specific tasks completed with min A. SLP targeting goal of speech intelligibility through challenging pt to verbalize biographical and personal information. Pt identified speech intelligibility strategies given min cues to attend to external aid. Later in session, pt referring to external aid with up A. Pt warranting max A for use of strategies. He was 90% intelligible in words and ~70% intelligible in phrases which improved to ~80% by conclusion of session. Pt also challenged in a structured task where he communicated an unknown topic to listener with goal of using strategies. Pt was ~65-75% intelligible in phrases with max A. SLP addressing problem solving at conclusion of session through review of safety  precautions. Pt initially verbalizing it was safe to get up indep though able to correct after mod A. He also identified call button and 3 reasons to use with mod A. Pt left upright in [facility] with call button within reach and father present. SLP to continue POC.   Pain Pain Assessment Pain Scale: 0-10 Pain Score: 0-No pain  Therapy/Group: Individual Therapy  Joane GORMAN Fuss 10/15/2024, 9:50 AM

## 2024-10-15 NOTE — Progress Notes (Signed)
 Physical Therapy Note  Patient Details  Name: Nicholas Holt MRN: [id number] Date of Birth: 1981/02/29 Today's Date: 10/15/2024    Physical Therapist participated in the interdisciplinary team conference, providing clinical information regarding the patient's current status, treatment goals, and weekly focus, including any barriers that need to be addressed. Please see the Inpatient Rehabilitation Team Conference and Plan of Care Update for further details.    Domanik [place] P Aeralyn Barna 10/15/2024, 10:05 AM

## 2024-10-15 NOTE — Progress Notes (Signed)
 Occupational Therapy Session Note  Patient Details  Name: Nicholas Holt MRN: [id number] Date of Birth: 12-Jan-1981  Today's Date: 10/15/2024 OT Individual Time: 1351-1417 OT Individual Time Calculation (min): 26 min    Short Term Goals: Week 1:  OT Short Term Goal 1 (Week 1): Patient will sit unsupported at edge of bed x 15 seconds as needed to prepare for level surface transfer to chair. OT Short Term Goal 2 (Week 1): Patient will sufficiently lean forward while seated to safely reach to feet during bathing. OT Short Term Goal 3 (Week 1): Patient will transition to standing from sitting with min assist as needed to pull up/down clothing during BADL.  Skilled Therapeutic Interventions/Progress Updates:    Pt received sitting up in the w/c with no c/o pain. Pt was taken via w/c to the therapy gym for time management. He worked on several activities that targeted standing level functional reach with focus on forward trunk translation and weight shift into toes to reduce posterior bias and improved static/dynamic balance. He required min A overall with occasional LOB posteriorly, correctable with verbal cueing for body awareness. Stand pivot to the mat with min A. Pt completed various core stabilization exercises EOM, with focus on core strengthening and coordination to challenge dynamic sitting balance, improved stability in standing, and ultimately reduced fall risk during ADLs. Skilled cueing required for proper muscle engagement, as well as grading of resistance to provide appropriate difficulty level.  He returned to his room and was left sitting up with all needs met.   Therapy Documentation Precautions:  Precautions Precautions: Fall Recall of Precautions/Restrictions: Impaired Precaution/Restrictions Comments: Left AFO, h/o left hemiplegia Restrictions Weight Bearing Restrictions Per Provider Order: No  Therapy/Group: Individual Therapy  Nicholas Holt 10/15/2024, 2:04 PM

## 2024-10-15 NOTE — Progress Notes (Signed)
 Patient ID: Nicholas Holt, male   DOB: [birth year], 43 y.o.   MRN: [id number]  Met with pt and dad who is present in his room his mom is home but will be coming back. Discussed team conference goals of min assist level and target discharge date of 12/15. Dad is concerned pt will not be at a level where they can manage him. He ws asking for an extension if he needs it. Discussed will need to wait and see how pt does and discuss with MD and therapy team. Asked if ever have gotten PCS where a CNA would come out for a few hours per day to assist. Dad reports no will discuss when Mom returns back to the hospital. Work on best plan for pt and did discuss SNF option but do not feel pt would do well in one without his parents staying with him.

## 2024-10-15 NOTE — Plan of Care (Signed)
  Problem: Consults Goal: RH STROKE PATIENT EDUCATION Description: See Patient Education module for education specifics  Outcome: Progressing   Problem: RH BOWEL ELIMINATION Goal: RH STG MANAGE BOWEL WITH ASSISTANCE Description: STG Manage Bowel with mod I Assistance. Outcome: Progressing Goal: RH STG MANAGE BOWEL W/MEDICATION W/ASSISTANCE Description: STG Manage Bowel with Medication with mod I Assistance. Outcome: Progressing   Problem: RH SAFETY Goal: RH STG ADHERE TO SAFETY PRECAUTIONS W/ASSISTANCE/DEVICE Description: STG Adhere to Safety Precautions With cues Assistance/Device. Outcome: Progressing   Problem: RH COGNITION-NURSING Goal: RH STG USES MEMORY AIDS/STRATEGIES W/ASSIST TO PROBLEM SOLVE Description: STG Uses Memory Aids/Strategies With cues Assistance to Problem Solve. Outcome: Progressing   Problem: RH KNOWLEDGE DEFICIT Goal: RH STG INCREASE KNOWLEDGE OF HYPERTENSION Description: Patient and parents will be able to manage HTN using educational resources for medications and dietary modification independently Outcome: Progressing Goal: RH STG INCREASE KNOWLEGDE OF HYPERLIPIDEMIA Description: Patient and parents will be able to manage HLD using educational resources for medications and dietary modification independently Outcome: Progressing Goal: RH STG INCREASE KNOWLEDGE OF STROKE PROPHYLAXIS Description: Patient and parents will be able to manage secondary risks using educational resources for medications and dietary modification independently Outcome: Progressing

## 2024-10-15 NOTE — Progress Notes (Signed)
 PROGRESS NOTE   Subjective/Complaints:  No issues overnite, slept ok Still very tight at the RIght wrist  ROS: as per HPI. Denies CP, SOB, abd pain, N/V/D, or any other complaints at this time.     Objective:   No results found.  Recent Labs    10/13/24 0458  WBC 6.3  HGB 13.2  HCT 40.2  PLT 154   Recent Labs    10/13/24 0458  NA 139  K 3.7  CL 103  CO2 25  GLUCOSE 111*  BUN 10  CREATININE 0.99  CALCIUM  8.6*        Intake/Output Summary (Last 24 hours) at 10/15/2024 0945 Last data filed at 10/15/2024 0600 Gross per 24 hour  Intake --  Output 500 ml  Net -500 ml        Physical Exam: Vital Signs Blood pressure 116/74, pulse 84, temperature 98 F (36.7 C), resp. rate 18, height 5' 8 (1.727 m), weight 87.8 kg, SpO2 100%.  General: No acute distress Mood and affect are appropriate Heart: Regular rate and rhythm no rubs murmurs or extra sounds Lungs: Clear to auscultation, breathing unlabored, no rales or wheezes Abdomen: Positive bowel sounds, soft nontender to palpation, nondistended Extremities: No clubbing, cyanosis, or edema Skin: No evidence of breakdown, no evidence of rash    PRIOR EXAMS: Musculoskeletal:     Cervical back: Neck supple.     Comments: RUE- 4+ to 5-/5 throughout RLE- 4+/5 throughout- stronger than expected LUE- biceps and triceps 3-/5; WE 2-/5 but somewhat contracted with increased elbow and wrist flexor tone ; Grip 3-/5 and FA 2+/5 LLE- HF 3-/5; KE 4-/5; KF 3-/5; DF 2-/5 and PF 2-/5   Poaturing and contracture of L elbow and wrist- both in flexion at rest. ? If Botox may  be worth a trial   Alien arm, mild left,  Sensation to LT intact   Skin:    General: Skin is warm and dry.     Comments: R forearm IV- looks OK  Neurological:     Mental Status: He is alert.     Comments: is very dysarthric, difficult to understand  Somewhat Ataxic with arms as well  B/L    Assessment/Plan: 1. Functional deficits which require 3+ hours per day of interdisciplinary therapy in a comprehensive inpatient rehab setting. Physiatrist is providing close team supervision and 24 hour management of active medical problems listed below. Physiatrist and rehab team continue to assess barriers to discharge/monitor patient progress toward functional and medical goals  Care Tool:  Bathing    Body parts bathed by patient: Left arm, Chest, Abdomen, Front perineal area, Right upper leg, Left upper leg, Face, Right arm   Body parts bathed by helper: Right lower leg, Left lower leg     Bathing assist Assist Level: Minimal Assistance - Patient > 75%     Upper Body Dressing/Undressing Upper body dressing   What is the patient wearing?: Pull over shirt    Upper body assist Assist Level: Supervision/Verbal cueing    Lower Body Dressing/Undressing Lower body dressing      What is the patient wearing?: Underwear/pull up, Pants     Lower  body assist Assist for lower body dressing: Minimal Assistance - Patient > 75%     Toileting Toileting Toileting Activity did not occur (Clothing management and hygiene only): N/A (no void or bm)  Toileting assist Assist for toileting: Moderate Assistance - Patient 50 - 74%     Transfers Chair/bed transfer  Transfers assist     Chair/bed transfer assist level: Moderate Assistance - Patient 50 - 74%     Locomotion Ambulation   Ambulation assist      Assist level: Moderate Assistance - Patient 50 - 74% Assistive device: Hand held assist Max distance: 110   Walk 10 feet activity   Assist     Assist level: Moderate Assistance - Patient - 50 - 74% Assistive device: Hand held assist   Walk 50 feet activity   Assist    Assist level: Moderate Assistance - Patient - 50 - 74% Assistive device: Hand held assist    Walk 150 feet activity   Assist    Assist level: Moderate Assistance - Patient - 50 -  74% Assistive device: Hand held assist    Walk 10 feet on uneven surface  activity   Assist     Assist level: Moderate Assistance - Patient - 50 - 74% Assistive device: Hand held assist   Wheelchair     Assist Is the patient using a wheelchair?: Yes Type of Wheelchair: Manual    Wheelchair assist level: Dependent - Patient 0% Max wheelchair distance: 150    Wheelchair 50 feet with 2 turns activity    Assist        Assist Level: Dependent - Patient 0%   Wheelchair 150 feet activity     Assist      Assist Level: Dependent - Patient 0%   Blood pressure 116/74, pulse 84, temperature 98 F (36.7 C), resp. rate 18, height 5' 8 (1.727 m), weight 87.8 kg, SpO2 100%.  Medical Problem List and Plan: 1. Functional deficits secondary to L inferior basal ganglia stroke (posterior limb of internal capsule)             -patient may  shower             -ELOS/Goals: 14-18 days min A to mod I  -Continue CIR Team conf in am  2.  Antithrombotics: -DVT/anticoagulation:  Pharmaceutical: Lovenox  40mg  daily             -antiplatelet therapy: DAPT X 3 weeks followed by Plavix  alone.  3. Pain Management: Tylenol  and flexeril  prn.  4. Mood/Behavior/Sleep: LCSW to follow for evaluation and support             --ativan  6mg  at nights for sleep/nightmares             -antipsychotic agents: N/A 5. Neuropsych/cognition: This patient is not fully capable of making decisions on his own behalf. 6. Skin/Wound Care: Routine pressure relief measures.  7. Fluids/Electrolytes/Nutrition: Monitor I/O. Routine labs. Continue Klor CR 10mEq BID 8. Panhypopituitarism: On hydrocortisone  5mg  daily, levothyroxine  100mcg daily, and DDAVP  0.2mg  TID-->followed by Dr. Faythe 9. Low grade fever: resolved CXR and UA neg 10.  HTN: Monitor BP tid--on Losartan  25mg  daily  -12/2 BPs fine, monitor  Vitals:   10/11/24 1308 10/11/24 2102 10/12/24 0606 10/12/24 1435  BP: (!) 138/90 126/71 (!) 117/92 124/65    10/12/24 2037 10/13/24 0640 10/13/24 1545 10/13/24 2012  BP: 114/70 122/84 (!) 152/83 136/86   10/14/24 0547 10/14/24 1519 10/14/24 1942 10/15/24 0550  BP: (!) 97/59  136/89 127/76 116/74    11.  Hypernatremia: Resolved Sodium trending back up and DDAVP  increased to 2 mg TID today, Mom prefers intranasal route for pt thinks it controls urination better , will change to home dose and monitor 0.1 QID, (sometimes used .2mg  at noc)       Latest Ref Rng & Units 10/13/2024    4:58 AM 10/11/2024    5:36 AM 10/10/2024    5:36 AM  BMP  Glucose 70 - 99 mg/dL 888  887  98   BUN 6 - 20 mg/dL 10  12  13    Creatinine 0.61 - 1.24 mg/dL 9.00  8.64  8.80   Sodium 135 - 145 mmol/L 139  142  146   Potassium 3.5 - 5.1 mmol/L 3.7  4.1  3.9   Chloride 98 - 111 mmol/L 103  108  111   CO2 22 - 32 mmol/L 25  26  25    Calcium  8.9 - 10.3 mg/dL 8.6  8.9  9.2   Recheck BMP in am   12. Leucocytosis: WBC of 10.6- Monitor for fevers and other signs of infection  -10/11/24 down to 5.7 today, monitor 13.  H/o Supersella germinoma: Hx of left hemiparesis, cognitive deficits and seizures in the past (no meds)  14. Mild OSA: Unable to tolerate CPAP 15. Mild constipation- pt goes daily at home- might need to intervene if hasn't gone by tomorrow.  -10/11/24 no BM in 3 days, will give MoM 15ml once. Pt on Senokot S 2 tabs daily.  -10/12/24 LBM yesterday with MoM-- mom wants to leave meds alone though, monitor for further BMs 16. HLD: continue Rosuvastatin  20mg  daily  17.  Left wrist contracture vs spasticity consider botulinum toxin injection   LOS: 5 days A FACE TO FACE EVALUATION WAS PERFORMED  Prentice FORBES Compton 10/15/2024, 9:45 AM

## 2024-10-15 NOTE — Patient Care Conference (Signed)
 Inpatient RehabilitationTeam Conference and Plan of Care Update Date: 10/15/2024   Time: 10:03 AM    Patient Name: Nicholas Holt      Medical Record Number: [id number]  Date of Birth: [birth year] Sex: Male         Room/Bed: 4M05C/4M05C-01 Payor Info: Payor: [employer] / Plan: DREMA DUAL COMPLETE / Product Type: *No Product type* /    Admit Date/Time:  10/10/2024  3:39 PM  Primary Diagnosis:  Left basal ganglia embolic stroke [facility])  Hospital Problems: Principal Problem:   Left basal ganglia embolic stroke [facility]) Active Problems:   Small vessel stroke [facility])    Expected Discharge Date: Expected Discharge Date: 10/27/24  Team Members Present: Physician leading conference: Dr. Prentice Compton Social Worker Present: Rhoda Clement, LCSW Nurse Present: Barnie Ronde, RN PT Present: Sherlean Perks, PT OT Present: Delon Sharps, OT SLP Present: Blaise Alderman, SLP     Current Status/Progress Goal Weekly Team Focus  Bowel/Bladder   Continent of bowel and bladder   Maintain bowel and bladder regimen   Maintain bowel and bladder regimen    Swallow/Nutrition/ Hydration               ADL's   supervision UB self care, min A LB bathing, mod A LB dressing,  max toileting, mod stand pivot transfers   min A overall   postural control, sit to stands, balance, attention to task, ADL training, pt/fam education    Mobility   Overall modA with bed mobility and transfers (HHA) and ambulation (up to 150' HHA)   minA overall  barriers: previous brain CA; Focus: NMRE, dynamic standing balance, weight shift, gait, bed mobility/transfers, pt/family education.    Communication   severe dysarthria   mod A @85 % phrase   SLOP speech strategies, increased vocal intensity    Safety/Cognition/ Behavioral Observations  severe cognitive deficits   modA   orientation, problem solving, memory    Pain   Denies pain   remain pain free   Remain pain free  during hospitalization    Skin   No skin issues. Skin remains clean and intact   remain free of  skin breakdown  remain free of skin breakdown      Discharge Planning:  Home with parents who stay here with him-need him to be supervision but goals are min assist. Will see if can manage this level of care at DC. Await team's evaluations   Team Discussion: Patient admitted post left basal ganglia CVA with history of germinoma, multi infarcts and chronic left hemiparesis; using a left AFO on LE with spasticity/wrist contracture and DDAVP . Father endorses STMD and orientation deficits have worsened with new CVA and note dysarthria with low vocal intensity.   Patient on target to meet rehab goals: yes, currently needs supervision for upper body care and min assist for lower body care with max assist for toileting.  Needs mod assist for stand pivot transfers due to postural control issues and decreased attention to tasks with left inattention. Needs mod assist to ambulate up to 150' . Goals for discharge set for min - mod assist overall.  *See Care Plan and progress notes for long and short-term goals.   Revisions to Treatment Plan:  Scored 11/30 on the SLUMs   Teaching Needs: Safety, medications, transfers, toileting, etc.   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  Barriers to Discharge: Medical stability;Spasticity;Electrolyte abnormality   Possible Resolutions to [employer]: consider botulinum toxin injection, adjusting meds for panhypopit   Continued Need for Acute Rehabilitation Level of Care: The patient requires daily medical management by a physician with specialized training in physical medicine and  rehabilitation for the following reasons: Direction of a multidisciplinary physical rehabilitation program to maximize functional independence : Yes Medical management of patient stability for increased activity during participation in an intensive rehabilitation regime.: Yes Analysis of laboratory values and/or radiology reports with any subsequent need for medication adjustment and/or medical intervention. : Yes   I attest that I was present, lead the team conference, and concur with the assessment and plan of the team.   Fredericka Sober B 10/15/2024, 1:54 PM

## 2024-10-15 NOTE — Progress Notes (Signed)
 Speech Language Pathology Daily Session Note  Patient Details  Name: Nicholas Holt MRN: [id number] Date of Birth: Oct 30, 1981  Today's Date: 10/15/2024 SLP Individual Time: 0733-0800 SLP Individual Time Calculation (min): 27 min  Short Term Goals: Week 1: SLP Short Term Goal 1 (Week 1): Pt will utilize compensatory speech strategies at phrase level to maintain 75% intelligibility w/ maxA cues SLP Short Term Goal 2 (Week 1): Pt will utilize compensatory speech strategies at word level to maintain 85% intelligibility w/ maxA cues SLP Short Term Goal 3 (Week 1): Pt will utilize external memory aids to recall recent/relevant info w/ maxA SLP Short Term Goal 4 (Week 1): Pt will utilize external memory aids to be AOx3 w/ modA SLP Short Term Goal 5 (Week 1): Pt will solve functional problems w/ maxA  Skilled Therapeutic Interventions: SLP conducted skilled therapy session targeting speech intelligibility goals. Upon SLP entry, patient brushing teeth in bed given supervision after setup assist from his mother. SLP facilitated a mildly complex speech intelligibility task where pt read simple and complex sentences containing bilabials in the initial position. Patient ~75% intelligible given context though ~40% intelligible without context across 2 sets of 10 for /p/ and /m/ and 1 set of 10 for /b/ and /w/. Patient benefited from mod to max cues to reduce speed, pause, and increase volume. Patient was left in room with call bell in reach, alarm set, and family present. SLP will continue to target goals per plan of care.        Pain   None endorsed  Therapy/Group: Individual Therapy  Ashley A Ellin 10/15/2024, 9:14 AM

## 2024-10-15 NOTE — Progress Notes (Signed)
 Occupational Therapy Session Note  Patient Details  Name: Nicholas Holt MRN: [id number] Date of Birth: [birth year]  Today's Date: 10/15/2024 OT Individual Time: 1045-1150 OT Individual Time Calculation (min): 65 min  Missed minutes: 10 min; pt fatigue  Short Term Goals: Week 1:  OT Short Term Goal 1 (Week 1): Patient will sit unsupported at edge of bed x 15 seconds as needed to prepare for level surface transfer to chair. OT Short Term Goal 2 (Week 1): Patient will sufficiently lean forward while seated to safely reach to feet during bathing. OT Short Term Goal 3 (Week 1): Patient will transition to standing from sitting with min assist as needed to pull up/down clothing during BADL.  Skilled Therapeutic Interventions/Progress Updates:      Therapy Documentation Precautions:  Precautions Precautions: Fall Recall of Precautions/Restrictions: Impaired Precaution/Restrictions Comments: Left AFO, h/o left hemiplegia Restrictions Weight Bearing Restrictions Per Provider Order: No General: Pt seated in W/C upon OT arrival, agreeable to OT. Father present  Pain: no pain reported  Balance: OT providing skilled intervention in order to facilitate anterior weight shifting in order to prepare for increased independence with transfers. OT challenging pt in various activities that facilitate forward weight shifting and dynamic balance and ability to reach to floor to retrieve items. Pt also able to multitask with speaking and completing activities. Pt also demonstrating anticipatory awareness when laying connect 4, able to strategically plan how to make next move within game. Pt completing all activities at SBA. After games pt fatigued and wanted to go back to room.   Pt seated in W/C at end of session with W/C alarm donned, call light within reach and 4Ps assessed.    Therapy/Group: Individual Therapy  Camie Hoe, OTD, OTR/L 10/15/2024, 3:20 PM

## 2024-10-15 NOTE — Progress Notes (Signed)
 Physical Therapy Session Note  Patient Details  Name: Nicholas Holt MRN: [id number] Date of Birth: 1981/10/27  Today's Date: 10/15/2024 PT Individual Time: 9146-9073 PT Individual Time Calculation (min): 33 min  Today's Date: 10/15/2024 PT Missed Time: 12 Minutes Missed Time Reason: Other (Comment) (delay in care)  Short Term Goals: Week 1:  PT Short Term Goal 1 (Week 1): Pt will transfer sup to sit w/ min A consistently from flat bed. PT Short Term Goal 2 (Week 1): Pt will transfer sit to stand w/ min A consistently. PT Short Term Goal 3 (Week 1): Pt will amb x 120' w/ min A and LRAD PT Short Term Goal 4 (Week 1): Pt will negotiate 4 steps w/ 1 rail and min A.  Skilled Therapeutic Interventions/Progress Updates:   Arrived late to session and received pt sitting in recliner with parents at bedside. Pt's mom concerned with D/C date and requesting extension - primary team notified. Pt agreeable to PT treatment and denied any pain during session. Session with emphasis on functional mobility/transfers, generalized strengthening and endurance, dynamic standing balance/coordination, NMR, and gait training. Pt required x 3 attempts to stand from recliner without AD but was unable. Stood with RW and mod A and performed stand<>pivot into WC with RW and mod A (pt unable to grip with LUE on RW).   Pt transported to/from room in [facility] dependently for time management purposes. Stood with hemi walker and mod A x 2 trials (cues for hand placement and anterior weight shifting) and ambulated 67ft x 1 and 144ft x 1 with hemi walker and mod A (+2 for WC follow) with therapist supporting LUE - added shoe cover to L foot on 2nd gait trial and noted significant improvements in L foot clearance). Pt required cues to kick LLE and to relax LUE (noted increasing LUE flexor tone with gait). Returned to room and performed blocked practice sit<>stands with hemi walker and min A x 5 reps, then concluded session with pt  sitting in [facility] with all needs within reach and family at bedside.   Therapy Documentation Precautions:  Precautions Precautions: Fall Recall of Precautions/Restrictions: Impaired Precaution/Restrictions Comments: Left AFO, h/o left hemiplegia Restrictions Weight Bearing Restrictions Per Provider Order: No  Therapy/Group: Individual Therapy Therisa HERO Zaunegger Therisa Stains PT, DPT 10/15/2024, 7:11 AM

## 2024-10-15 NOTE — Progress Notes (Signed)
 Occupational Therapy participated in the interdisciplinary team conference, providing clinical information regarding the patient's current status, treatment goals, and weekly focus, including any barriers that need to be addressed. Please see the Inpatient Rehabilitation Team Conference and Plan of Care Update for further details.

## 2024-10-16 DIAGNOSIS — E230 Hypopituitarism: Secondary | ICD-10-CM | POA: Diagnosis not present

## 2024-10-16 DIAGNOSIS — G8194 Hemiplegia, unspecified affecting left nondominant side: Secondary | ICD-10-CM | POA: Diagnosis not present

## 2024-10-16 DIAGNOSIS — I639 Cerebral infarction, unspecified: Secondary | ICD-10-CM | POA: Diagnosis not present

## 2024-10-16 LAB — BASIC METABOLIC PANEL WITH GFR
Anion gap: 7 (ref 5–15)
BUN: 12 mg/dL (ref 6–20)
CO2: 27 mmol/L (ref 22–32)
Calcium: 8.6 mg/dL — ABNORMAL LOW (ref 8.9–10.3)
Chloride: 105 mmol/L (ref 98–111)
Creatinine, Ser: 1.07 mg/dL (ref 0.61–1.24)
GFR, Estimated: >60 mL/min (ref >60)
Glucose, Bld: 86 mg/dL (ref 70–99)
Potassium: 4 mmol/L (ref 3.5–5.1)
Sodium: 139 mmol/L (ref 135–145)

## 2024-10-16 LAB — CBC
HCT: 37.2 % — ABNORMAL LOW (ref 39.0–52.0)
Hemoglobin: 12.2 g/dL — ABNORMAL LOW (ref 13.0–17.0)
MCH: 29.8 pg (ref 26.0–34.0)
MCHC: 32.8 g/dL (ref 30.0–36.0)
MCV: 91 fL (ref 80.0–100.0)
Platelets: 169 K/uL (ref 150–400)
RBC: 4.09 MIL/uL — ABNORMAL LOW (ref 4.22–5.81)
RDW: 15.1 % (ref 11.5–15.5)
WBC: 6.1 K/uL (ref 4.0–10.5)
nRBC: 0 % (ref 0.0–0.2)

## 2024-10-16 NOTE — Progress Notes (Signed)
 Occupational Therapy Session Note  Patient Details  Name: Nicholas Holt MRN: [id number] Date of Birth: Dec 03, 1980  Today's Date: 10/16/2024 OT Individual Time: 8692-8651 OT Individual Time Calculation (min): 41 min    Short Term Goals: Week 1:  OT Short Term Goal 1 (Week 1): Patient will sit unsupported at edge of bed x 15 seconds as needed to prepare for level surface transfer to chair. OT Short Term Goal 2 (Week 1): Patient will sufficiently lean forward while seated to safely reach to feet during bathing. OT Short Term Goal 3 (Week 1): Patient will transition to standing from sitting with min assist as needed to pull up/down clothing during BADL.  Skilled Therapeutic Interventions/Progress Updates:   Pt greeted seated in w/c, pt agreeable to OT intervention.      Transfers/bed mobility/functional mobility:  Pt completed blocked practice of stand pivot transfers from EOM<>w/c with HHA on R sid. Pt completed transfers with overall MIN A needing assist for balance to d/t posterior lean, pt did best transferring to R side.    Education: educated pts mother on impaired attention to task/cues during sessions. Mother asking if pt should be on attention medicine, deferred question to MD.    NMR: worked on blocked practice of sit>stands from EOM with a focus on forward lean when powering into stand and maintain balance as pt tends to lean backwards once standing. Pt was able to self correct with MIN- MOD multimodal cues but seems to be most affected by impaired attention.   Pt engaged in standing/sitting balance task with pt instructed to reach out of BOS to mirror to tap letters on mirror in specified order to improve posterior lean in sitting/standing. Pt completed task with overall MIN- MODA but needed MAX cues to attend to task.   Worked on sit>stands in // bars with a focus on providing NMRE to LLE with pt instructed to step to targets with LLE to improve heel/strike for functional  gait. Pt completed task with BUE support with CGA. Pt also able to complete standing marches in bars with LLE with and without 4lb ankle weight to promote NMRE to affected LE.    Cognition:               Pt very difficult to understand during session, garbled speech. Pt noted to be easily distracted in gym needing max cues to attend to session.   Ended session with pt seated in w/c with all needs within reach.   Therapy Documentation Precautions:  Precautions Precautions: Fall Recall of Precautions/Restrictions: Impaired Precaution/Restrictions Comments: Left AFO, h/o left hemiplegia Restrictions Weight Bearing Restrictions Per Provider Order: No  Pain: No pain    Therapy/Group: Individual Therapy  [name] 10/16/2024, 2:40 PM

## 2024-10-16 NOTE — Progress Notes (Signed)
 PROGRESS NOTE   Subjective/Complaints:  No issues overnite, slept ok Still very tight at the RIght wrist  ROS: as per HPI. Denies CP, SOB, abd pain, N/V/D, or any other complaints at this time.     Objective:   No results found.  Recent Labs    10/16/24 0507  WBC 6.1  HGB 12.2*  HCT 37.2*  PLT 169   Recent Labs    10/16/24 0507  NA 139  K 4.0  CL 105  CO2 27  GLUCOSE 86  BUN 12  CREATININE 1.07  CALCIUM  8.6*       No intake or output data in the 24 hours ending 10/16/24 0734       Physical Exam: Vital Signs Blood pressure 97/60, pulse 79, temperature 98.3 F (36.8 C), resp. rate 19, height 5' 8 (1.727 m), weight 87.8 kg, SpO2 98%.  General: No acute distress Mood and affect are appropriate Heart: Regular rate and rhythm no rubs murmurs or extra sounds Lungs: Clear to auscultation, breathing unlabored, no rales or wheezes Abdomen: Positive bowel sounds, soft nontender to palpation, nondistended Extremities: No clubbing, cyanosis, or edema Skin: No evidence of breakdown, no evidence of rash    PRIOR EXAMS: Musculoskeletal:     Cervical back: Neck supple.     Comments: RUE- 4+ to 5-/5 throughout RLE- 4+/5 throughout- stronger than expected LUE- biceps and triceps 3-/5; WE 0 contracted with increased elbow and wrist flexor tone ; Grip 3-/5 and FA 2+/5 LLE- HF 3-/5; KE 4-/5; KF 3-/5; DF 2-/5 and PF 2-/5   Poaturing and contracture of L elbow and wrist- both in flexion at rest. ? If Botox may  be worth a trial   Alien arm, mild left,  Sensation to LT intact   Skin:    General: Skin is warm and dry.     Comments: R forearm IV- looks OK  Neurological:     Mental Status: He is alert.     Comments: is very dysarthric, difficult to understand  Somewhat Ataxic with arms as well B/L    Assessment/Plan: 1. Functional deficits which require 3+ hours per day of interdisciplinary therapy in a  comprehensive inpatient rehab setting. Physiatrist is providing close team supervision and 24 hour management of active medical problems listed below. Physiatrist and rehab team continue to assess barriers to discharge/monitor patient progress toward functional and medical goals  Care Tool:  Bathing    Body parts bathed by patient: Left arm, Chest, Abdomen, Front perineal area, Right upper leg, Left upper leg, Face, Right arm   Body parts bathed by helper: Right lower leg, Left lower leg     Bathing assist Assist Level: Minimal Assistance - Patient > 75%     Upper Body Dressing/Undressing Upper body dressing   What is the patient wearing?: Pull over shirt    Upper body assist Assist Level: Supervision/Verbal cueing    Lower Body Dressing/Undressing Lower body dressing      What is the patient wearing?: Underwear/pull up, Pants     Lower body assist Assist for lower body dressing: Minimal Assistance - Patient > 75%     Financial Trader Activity  did not occur Press Photographer and hygiene only): N/A (no void or bm)  Toileting assist Assist for toileting: Moderate Assistance - Patient 50 - 74%     Transfers Chair/bed transfer  Transfers assist     Chair/bed transfer assist level: Moderate Assistance - Patient 50 - 74%     Locomotion Ambulation   Ambulation assist      Assist level: Moderate Assistance - Patient 50 - 74% Assistive device: Hand held assist Max distance: 110   Walk 10 feet activity   Assist     Assist level: Moderate Assistance - Patient - 50 - 74% Assistive device: Hand held assist   Walk 50 feet activity   Assist    Assist level: Moderate Assistance - Patient - 50 - 74% Assistive device: Hand held assist    Walk 150 feet activity   Assist    Assist level: Moderate Assistance - Patient - 50 - 74% Assistive device: Hand held assist    Walk 10 feet on uneven surface  activity   Assist     Assist  level: Moderate Assistance - Patient - 50 - 74% Assistive device: Hand held assist   Wheelchair     Assist Is the patient using a wheelchair?: Yes Type of Wheelchair: Manual    Wheelchair assist level: Dependent - Patient 0% Max wheelchair distance: 150    Wheelchair 50 feet with 2 turns activity    Assist        Assist Level: Dependent - Patient 0%   Wheelchair 150 feet activity     Assist      Assist Level: Dependent - Patient 0%   Blood pressure 97/60, pulse 79, temperature 98.3 F (36.8 C), resp. rate 19, height 5' 8 (1.727 m), weight 87.8 kg, SpO2 98%.  Medical Problem List and Plan: 1. Functional deficits secondary to L inferior basal ganglia stroke (posterior limb of internal capsule)             -patient may  shower             -ELOS/Goals: 12/15 min A to mod I  -Continue CIR  2.  Antithrombotics: -DVT/anticoagulation:  Pharmaceutical: Lovenox  40mg  daily             -antiplatelet therapy: DAPT X 3 weeks followed by Plavix  alone.  3. Pain Management: Tylenol  and flexeril  prn.  4. Mood/Behavior/Sleep: LCSW to follow for evaluation and support             --ativan  6mg  at nights for sleep/nightmares             -antipsychotic agents: N/A 5. Neuropsych/cognition: This patient is not fully capable of making decisions on his own behalf. 6. Skin/Wound Care: Routine pressure relief measures.  7. Fluids/Electrolytes/Nutrition: Monitor I/O. Routine labs. Continue Klor CR 10mEq BID 8. Panhypopituitarism: On hydrocortisone  5mg  daily, levothyroxine  100mcg daily, and DDAVP  0.2mg  TID-->followed by Dr. Faythe 9. Low grade fever: resolved CXR and UA neg 10.  HTN: Monitor BP tid--on Losartan  25mg  daily  -12/2 BPs fine, monitor  Vitals:   10/12/24 1435 10/12/24 2037 10/13/24 0640 10/13/24 1545  BP: 124/65 114/70 122/84 (!) 152/83   10/13/24 2012 10/14/24 0547 10/14/24 1519 10/14/24 1942  BP: 136/86 (!) 97/59 136/89 127/76   10/15/24 0550 10/15/24 1332 10/15/24  2007 10/16/24 0413  BP: 116/74 126/80 126/80 97/60    11.  Hypernatremia: Resolved Sodium trending back up and DDAVP  increased to 2 mg TID today, Mom prefers intranasal route for pt  thinks it controls urination better , will change to home dose and monitor 0.1 QID, (sometimes used .2mg  at noc)       Latest Ref Rng & Units 10/16/2024    5:07 AM 10/13/2024    4:58 AM 10/11/2024    5:36 AM  BMP  Glucose 70 - 99 mg/dL 86  888  887   BUN 6 - 20 mg/dL 12  10  12    Creatinine 0.61 - 1.24 mg/dL 8.92  9.00  8.64   Sodium 135 - 145 mmol/L 139  139  142   Potassium 3.5 - 5.1 mmol/L 4.0  3.7  4.1   Chloride 98 - 111 mmol/L 105  103  108   CO2 22 - 32 mmol/L 27  25  26    Calcium  8.9 - 10.3 mg/dL 8.6  8.6  8.9   Recheck BMP stable cont nasal DDAVP   12. Leucocytosis: WBC of 10.6- Monitor for fevers and other signs of infection  -10/11/24 down to 5.7 today, monitor 13.  H/o Supersella germinoma: Hx of left hemiparesis, cognitive deficits and seizures in the past (no meds)  14. Mild OSA: Unable to tolerate CPAP 15. Mild constipation- pt goes daily at home- might need to intervene if hasn't gone by tomorrow.  -10/11/24 no BM in 3 days, will give MoM 15ml once. Pt on Senokot S 2 tabs daily.  -10/12/24 LBM yesterday with MoM-- mom wants to leave meds alone though, monitor for further BMs 16. HLD: continue Rosuvastatin  20mg  daily  17.  Left wrist contracture vs spasticity consider botulinum toxin injection   LOS: 6 days A FACE TO FACE EVALUATION WAS PERFORMED  Prentice FORBES Compton 10/16/2024, 7:34 AM

## 2024-10-16 NOTE — Progress Notes (Signed)
 Occupational Therapy Session Note  Patient Details  Name: GIONNI VACA MRN: [id number] Date of Birth: 27-Aug-1981  Today's Date: 10/16/2024 OT Individual Time: 9069-8954 OT Individual Time Calculation (min): 75 min    Short Term Goals: Week 1:  OT Short Term Goal 1 (Week 1): Patient will sit unsupported at edge of bed x 15 seconds as needed to prepare for level surface transfer to chair. OT Short Term Goal 2 (Week 1): Patient will sufficiently lean forward while seated to safely reach to feet during bathing. OT Short Term Goal 3 (Week 1): Patient will transition to standing from sitting with min assist as needed to pull up/down clothing during BADL.  Skilled Therapeutic Interventions/Progress Updates:    Pt received in bed and eager for a shower. Mom present. Pt able to sit to EOB with CGA but would constantly lean back on bed. Placed pillows behind back for protection to avoid leaning on bed rail.    I needed to obtain different shower equipment so his mom supervised pt sitting EOB and she worked with using adhesive remover pads  pt needs frequent cues to stay sitting in neutral.    Pt stood from EOB  with CGA and then used HW in R hand to step pivot to L with min A.  Pt did well keeping his balance and was not pushing back.  Had pt then complete a stand pivot to tub bench with grab bar. Using bar pt did extremely well taking small safe steps.    On bench pt completed bathing except for feet (not enough space for him to lean forward), will need long sponge.  Pt stood and with cues to encourage forward lean on balls of feet, pt held onto bar with L hand and then used R hand to wash bottom.   Transferred back to wc to dress.  Placed chair adjacent to bed with bed rail up on his R side.  Pt dried off and donned lotion and deoderant.  Pt then completed dressing.  He only needed a small amount of A to adjust pants over feet and then pt stood using rail for support, once his balance was set ,  had pt let go to pull up pants.    Had pt practice standing in barefeet on floor to feel his feet on floor.  Cued pt to use his toes to dig into floor,  and get grounded This did help with his stability.  Spent time having pt move ant/post in small oscillations with bare feet.  Pt liked this exercise and stated it felt good on his feet.    Pt donned R sock, shoe and tied it.  Needed min A with L sock and shoe with AFO.   Pt participated extremely well and is very receptive to feedback and instructions.      Pt resting in wc with mother in the room with him.  Therapy Documentation Precautions:  Precautions Precautions: Fall Recall of Precautions/Restrictions: Impaired Precaution/Restrictions Comments: Left AFO, h/o left hemiplegia Restrictions Weight Bearing Restrictions Per Provider Order: No   Pain: Pain Assessment Pain Score: 0-No pain ADL: ADL Eating: Set up Grooming: Setup Where Assessed-Grooming: Sitting at sink Upper Body Bathing: Supervision/safety Where Assessed-Upper Body Bathing: Shower Lower Body Bathing: Minimal assistance Where Assessed-Lower Body Bathing: Shower Upper Body Dressing: Supervision/safety Where Assessed-Upper Body Dressing: Sitting at sink Lower Body Dressing: Minimal assistance Where Assessed-Lower Body Dressing: Wheelchair Toileting: Minimal assistance Where Assessed-Toileting: Teacher, Adult Education: Curator Method:  Stand pivot Toilet Transfer Equipment: Grab bars Tub/Shower Transfer: Unable to assess Tub/Shower Transfer Method: Unable to assess [employer] Transfer: Minimal assistance Film/video Editor Method: Warden/ranger: Grab bars ADL Comments: improving with posterior lean but needs constant cues   Therapy/Group: Individual Therapy  Jahayra Mazo 10/16/2024, 12:37 PM

## 2024-10-16 NOTE — Progress Notes (Signed)
 Speech Language Pathology Daily Session Note  Patient Details  Name: Nicholas Holt MRN: [id number] Date of Birth: [birth year]  Today's Date: 10/16/2024 SLP Individual Time: 1415-1500 SLP Individual Time Calculation (min): 45 min  Short Term Goals: Week 1: SLP Short Term Goal 1 (Week 1): Pt will utilize compensatory speech strategies at phrase level to maintain 75% intelligibility w/ maxA cues SLP Short Term Goal 2 (Week 1): Pt will utilize compensatory speech strategies at word level to maintain 85% intelligibility w/ maxA cues SLP Short Term Goal 3 (Week 1): Pt will utilize external memory aids to recall recent/relevant info w/ maxA SLP Short Term Goal 4 (Week 1): Pt will utilize external memory aids to be AOx3 w/ modA SLP Short Term Goal 5 (Week 1): Pt will solve functional problems w/ maxA  Skilled Therapeutic Interventions:   Pt and his mom greeted at bedside. He was up in his WC, agreeable to tx tasks targeting cognition and speech production. He was was able to recall therapies of the day w/ modA to fill in his memory notebook. He was then challenged to a structured recall task w/ 3 words. Immediately, he was able to recall 2/3 words, however, max-totalA required to recall 3/3 words after 3 and additional 5 min delays x2. He required only supervisionA for working memory during verbal task adding one item to an (unidentified) category. He required only minA to maintain over articulation and slow rate for ~90% intelligibility or greater at word and phrase level. However, significant breakdown in intelligibility remains w/ spontaneous speech @ conversation level. He required maxA to remain ~50% intelligible during spontaneous speech d/t rapid rate, limited articulatory precision, and low vocal intensity. However, during structured sentence level task (sentence formulation), he required only minA for speech intelligibility. At the end of tasks, he was left in his [facility] w/ his mom present and call  light within reach. Recommend cont ST per POC.   Pain Pain Assessment Pain Scale: 0-10 Pain Score: 0-No pain  Therapy/Group: Individual Therapy  [name] 10/16/2024, 2:46 PM

## 2024-10-16 NOTE — Progress Notes (Signed)
 Physical Therapy Session Note  Patient Details  Name: Nicholas Holt MRN: [id number] Date of Birth: [birth year]  Today's Date: 10/16/2024 PT Individual Time: 1051-1202 PT Individual Time Calculation (min): 71 min   Short Term Goals: Week 1:  PT Short Term Goal 1 (Week 1): Pt will transfer sup to sit w/ min A consistently from flat bed. PT Short Term Goal 2 (Week 1): Pt will transfer sit to stand w/ min A consistently. PT Short Term Goal 3 (Week 1): Pt will amb x 120' w/ min A and LRAD PT Short Term Goal 4 (Week 1): Pt will negotiate 4 steps w/ 1 rail and min A.  Skilled Therapeutic Interventions/Progress Updates: Patient sitting in [facility] with mother present on entrance to room. Patient alert and agreeable to PT session.   Patient with no complaints of pain during session. Pt continues to perseverate on past school (sophomore/junior year) and also difficult to understand. Discussed further with pt and pt mother on rationale of today's intervention and what will be continued to be worked on during pt's stay.   Therapeutic Activity: Transfers: Pt performed sit<>stand transfers throughout session with modA when pt would retropulse, but minA when adhering to mechanics/cue to increase anterior lean nose over toes.  Neuromuscular Re-ed: NMR facilitated during session with focus on proprioceptive feedback, dynamic sitting balance, coordination. - Pt ambulated roughly 100' x 1 with overall modA and no AD. Pt with close to step-to pattern that improved overall with cadence as well after cued to follow PTA step length and cadence (PTA on L). Pt also with decrease in maintaining L heel on ground during stance phase and decrease in heel strike and step clearance with cue to increase L hip flexion. Pt also required manual facilitation of weight shift.   - Pt ambulated another round (following PTA performing contract-relax on L HS to further lengthen due to tightness) with improvement in maintaining L heel  on floor during stance. Pt also with 5lb ankle weight donned L LE and same cue to increase/match cadence and step length B LE, and to increase step clearance on L. Pt with overall modA - Pt step to 6 step with L LE and overall modA and cue to increase weight shift to R - Pt sitting edge of mat with yellow theraband pulling into hip extension (donned around upper trunk) with cues to reach towards + 2 hand (error augmentation). PTA pulled pt posteriorly in various directions with cues for pt to increase anterior lean and to reach with L UE while keeping R UE on lap. Pt progressed to red theraband and overall performed with supervision without posterior LOB. Pt also cued throughout to maintain B feet flat on floor  - Pt stood and participated in connect 4 on table. Pt with max cuing to increase anterior weight shift onto front of foot due to posterior bias. Overall pt required fluctuating assistance between CGA-modA to prevent posterior LOB. 3 rounds performed with same level of assistance and cues  NMR performed for improvements in motor control and coordination, balance, sequencing, judgement, and self confidence/ efficacy in performing all aspects of mobility at highest level of independence.   Patient sitting in WC at end of session with brakes locked, mother present, and all needs within reach.      Therapy Documentation Precautions:  Precautions Precautions: Fall Recall of Precautions/Restrictions: Impaired Precaution/Restrictions Comments: Left AFO, h/o left hemiplegia Restrictions Weight Bearing Restrictions Per Provider Order: No  Therapy/Group: Individual Therapy  Xaiden Fleig PTA  10/16/2024, 12:15 PM

## 2024-10-17 DIAGNOSIS — G8194 Hemiplegia, unspecified affecting left nondominant side: Secondary | ICD-10-CM | POA: Diagnosis not present

## 2024-10-17 DIAGNOSIS — I639 Cerebral infarction, unspecified: Secondary | ICD-10-CM | POA: Diagnosis not present

## 2024-10-17 DIAGNOSIS — E230 Hypopituitarism: Secondary | ICD-10-CM | POA: Diagnosis not present

## 2024-10-17 NOTE — Group Note (Signed)
 Patient Details Name: Nicholas Holt MRN: [id number] DOB: 11-25-1980 Today's Date: 10/17/2024  Time Calculation: OT Group Time Calculation OT Group Start Time: 1100 OT Group Stop Time: 1200 OT Group Time Calculation (min): 60 min      Group Description: Dance Group: Pt participated in dance group with an emphasis on social interaction, motor planning, increasing overall activity tolerance and bimanual tasks. All songs were selected by group members. Dance moves included AROM of BUE/BLE gross motor movements with an emphasis on building functional endurance.   Individual level documentation: Patient completed group from sitting level. Patient needed supervision to complete various dance moves with cues for motivation and safety.  Patient needed MIN modifications during group.  Pain: Pain Assessment Pain Scale: 0-10 Pain Score: 0-No pain  Precautions:  Falls   Nicholas Holt 10/17/2024, 12:24 PM

## 2024-10-17 NOTE — Progress Notes (Signed)
 PROGRESS NOTE   Subjective/Complaints:  DIscussed Left wrist flexor tightness , mom thinks it has gotten worse since hospitalization   ROS: as per HPI. Denies CP, SOB, abd pain, N/V/D, or any other complaints at this time.     Objective:   No results found.  Recent Labs    10/16/24 0507  WBC 6.1  HGB 12.2*  HCT 37.2*  PLT 169   Recent Labs    10/16/24 0507  NA 139  K 4.0  CL 105  CO2 27  GLUCOSE 86  BUN 12  CREATININE 1.07  CALCIUM  8.6*       No intake or output data in the 24 hours ending 10/17/24 0800       Physical Exam: Vital Signs Blood pressure 116/68, pulse 66, temperature (!) 97.5 F (36.4 C), temperature source Oral, resp. rate 16, height 5' 8 (1.727 m), weight 87.8 kg, SpO2 100%.  General: No acute distress Mood and affect are appropriate Heart: Regular rate and rhythm no rubs murmurs or extra sounds Lungs: Clear to auscultation, breathing unlabored, no rales or wheezes Abdomen: Positive bowel sounds, soft nontender to palpation, nondistended Extremities: No clubbing, cyanosis, or edema Skin: No evidence of breakdown, no evidence of rash    PRIOR EXAMS: Musculoskeletal:     Cervical back: Neck supple.     Comments: RUE- 4+ to 5-/5 throughout RLE- 4+/5 throughout- stronger than expected LUE- biceps and triceps 3-/5; WE 0 contracted with increased elbow and wrist flexor tone ; Grip 3-/5 and FA 2+/5 LLE- HF 3-/5; KE 4-/5; KF 3-/5; DF 2-/5 and PF 2-/5   Increased flexor tone vs contracture L wrist-  ? If Botox may  be worth a trial   Alien arm, mild left,  Sensation to LT intact   Skin:    General: Skin is warm and dry.     Comments: R forearm IV- looks OK  Neurological:     Mental Status: He is alert.     Comments: moderately dysarthric, difficult to understand  Somewhat Ataxic with arms as well B/L    Assessment/Plan: 1. Functional deficits which require 3+ hours per day  of interdisciplinary therapy in a comprehensive inpatient rehab setting. Physiatrist is providing close team supervision and 24 hour management of active medical problems listed below. Physiatrist and rehab team continue to assess barriers to discharge/monitor patient progress toward functional and medical goals  Care Tool:  Bathing    Body parts bathed by patient: Left arm, Chest, Abdomen, Front perineal area, Right upper leg, Left upper leg, Face, Right arm, Buttocks   Body parts bathed by helper: Right lower leg, Left lower leg     Bathing assist Assist Level: Minimal Assistance - Patient > 75%     Upper Body Dressing/Undressing Upper body dressing   What is the patient wearing?: Pull over shirt    Upper body assist Assist Level: Supervision/Verbal cueing    Lower Body Dressing/Undressing Lower body dressing      What is the patient wearing?: Underwear/pull up, Pants     Lower body assist Assist for lower body dressing: Minimal Assistance - Patient > 75%     Toileting Toileting  Toileting Activity did not occur (Clothing management and hygiene only): N/A (no void or bm)  Toileting assist Assist for toileting: Moderate Assistance - Patient 50 - 74%     Transfers Chair/bed transfer  Transfers assist     Chair/bed transfer assist level: Moderate Assistance - Patient 50 - 74%     Locomotion Ambulation   Ambulation assist      Assist level: Moderate Assistance - Patient 50 - 74% Assistive device: Hand held assist Max distance: 110   Walk 10 feet activity   Assist     Assist level: Moderate Assistance - Patient - 50 - 74% Assistive device: Hand held assist   Walk 50 feet activity   Assist    Assist level: Moderate Assistance - Patient - 50 - 74% Assistive device: Hand held assist    Walk 150 feet activity   Assist    Assist level: Moderate Assistance - Patient - 50 - 74% Assistive device: Hand held assist    Walk 10 feet on uneven  surface  activity   Assist     Assist level: Moderate Assistance - Patient - 50 - 74% Assistive device: Hand held assist   Wheelchair     Assist Is the patient using a wheelchair?: Yes Type of Wheelchair: Manual    Wheelchair assist level: Dependent - Patient 0% Max wheelchair distance: 150    Wheelchair 50 feet with 2 turns activity    Assist        Assist Level: Dependent - Patient 0%   Wheelchair 150 feet activity     Assist      Assist Level: Dependent - Patient 0%   Blood pressure 116/68, pulse 66, temperature (!) 97.5 F (36.4 C), temperature source Oral, resp. rate 16, height 5' 8 (1.727 m), weight 87.8 kg, SpO2 100%.  Medical Problem List and Plan: 1. Functional deficits secondary to L inferior basal ganglia stroke (posterior limb of internal capsule)             -patient may  shower             -ELOS/Goals: 12/15 min A to mod I  -Continue CIR  2.  Antithrombotics: -DVT/anticoagulation:  Pharmaceutical: Lovenox  40mg  daily             -antiplatelet therapy: DAPT X 3 weeks followed by Plavix  alone.  3. Pain Management: Tylenol  and flexeril  prn.  4. Mood/Behavior/Sleep: LCSW to follow for evaluation and support             --ativan  6mg  at nights for sleep/nightmares             -antipsychotic agents: N/A 5. Neuropsych/cognition: This patient is not fully capable of making decisions on his own behalf. 6. Skin/Wound Care: Routine pressure relief measures.  7. Fluids/Electrolytes/Nutrition: Monitor I/O. Routine labs. Continue Klor CR 10mEq BID 8. Panhypopituitarism: On hydrocortisone  5mg  daily, levothyroxine  100mcg daily, and DDAVP  0.2mg  TID-->followed by Dr. Faythe Currently DDAVP  is 0.1mg  QID 9. Low grade fever: resolved CXR and UA neg 10.  HTN: Monitor BP tid--on Losartan  25mg  daily  -12/2 BPs fine, monitor  Vitals:   10/13/24 1545 10/13/24 2012 10/14/24 0547 10/14/24 1519  BP: (!) 152/83 136/86 (!) 97/59 136/89   10/14/24 1942 10/15/24  0550 10/15/24 1332 10/15/24 2007  BP: 127/76 116/74 126/80 126/80   10/16/24 0413 10/16/24 1408 10/16/24 2011 10/17/24 0600  BP: 97/60 126/67 (!) 142/88 116/68    11.  Hypernatremia: Resolved Sodium trending back up and DDAVP   increased to 2 mg TID today, Mom prefers intranasal route for pt thinks it controls urination better , will change to home dose and monitor 0.1 QID, (sometimes used .2mg  at noc)       Latest Ref Rng & Units 10/16/2024    5:07 AM 10/13/2024    4:58 AM 10/11/2024    5:36 AM  BMP  Glucose 70 - 99 mg/dL 86  888  887   BUN 6 - 20 mg/dL 12  10  12    Creatinine 0.61 - 1.24 mg/dL 8.92  9.00  8.64   Sodium 135 - 145 mmol/L 139  139  142   Potassium 3.5 - 5.1 mmol/L 4.0  3.7  4.1   Chloride 98 - 111 mmol/L 105  103  108   CO2 22 - 32 mmol/L 27  25  26    Calcium  8.9 - 10.3 mg/dL 8.6  8.6  8.9   Recheck BMP stable cont nasal DDAVP   12. Leucocytosis: WBC of 10.6- Monitor for fevers and other signs of infection  -10/11/24 down to 5.7 today, monitor 13.  H/o Supersella germinoma: Hx of left hemiparesis, cognitive deficits and seizures in the past (no meds)  14. Mild OSA: Unable to tolerate CPAP 15. Mild constipation- pt goes daily at home- might need to intervene if hasn't gone by tomorrow.  -10/11/24 no BM in 3 days, will give MoM 15ml once. Pt on Senokot S 2 tabs daily.  -10/12/24 LBM yesterday with MoM-- mom wants to leave meds alone though, monitor for further BMs 16. HLD: continue Rosuvastatin  20mg  daily  17.  Left wrist contracture vs spasticity consider botulinum toxin injection - discussed with pt and his mother, mom is in favor , pt unsure  LOS: 7 days A FACE TO FACE EVALUATION WAS PERFORMED  Nicholas Holt 10/17/2024, 8:00 AM

## 2024-10-17 NOTE — Progress Notes (Signed)
 Occupational Therapy Weekly Progress Note  Patient Details  Name: Nicholas Holt MRN: [id number] Date of Birth: 1981/04/01  Beginning of progress report period: October 11, 2024 End of progress report period: October 17, 2024  Today's Date: 10/17/2024 OT Individual Time: 0950-1050 OT Individual Time Calculation (min): 60 min    Patient has met 2 of 3 short term goals.  Pt continues to have difficulty holding upright sitting balance without having a posterior lean.  This is likely due to impaired attention vs actual weakness in core strength.  He has made excellent progress with forward lean to enable a smooth sit to stand.  Pt is now rising to stand with CGA and transferring with min A.  Pt tends to perseverate and repeats information about his past hobbies.  He is always agreeable and motivated in therapy sessions.   Patient continues to demonstrate the following deficits: impaired timing and sequencing, abnormal tone, unbalanced muscle activation, decreased coordination, and decreased motor planning, decreased attention, decreased awareness, decreased problem solving, decreased safety awareness, decreased memory, and delayed processing, and decreased sitting balance, decreased standing balance, decreased postural control, hemiplegia, and decreased balance strategies and therefore will continue to benefit from skilled OT intervention to enhance overall performance with BADL.  Patient progressing toward long term goals..  Continue plan of care.  OT Short Term Goals Week 1:  OT Short Term Goal 1 (Week 1): Patient will sit unsupported at edge of bed x 15 seconds as needed to prepare for level surface transfer to chair. OT Short Term Goal 1 - Progress (Week 1): Progressing toward goal ((he is sitting unsupported for 5-10 seconds at the most, unless his R hand is on a stedy surface in front of him with frequent cues to not let go of surface)) OT Short Term Goal 2 (Week 1): Patient will  sufficiently lean forward while seated to safely reach to feet during bathing. OT Short Term Goal 2 - Progress (Week 1): Met OT Short Term Goal 3 (Week 1): Patient will transition to standing from sitting with min assist as needed to pull up/down clothing during BADL. OT Short Term Goal 3 - Progress (Week 1): Met Week 2:  OT Short Term Goal 1 (Week 2): Pt will be able to hold static stand for 30 seconds with no posterior lean to increase safety and ability to adjust clothing pre and post toileting. OT Short Term Goal 2 (Week 2): Pt will be able to don pants over feet with supervision. OT Short Term Goal 3 (Week 2): pt will complete toilet transfers with LRAD with CGA to close S.  Skilled Therapeutic Interventions/Progress Updates:    Pt received in w/c ready for therapy.  Pt taken to gym to work on balance.   -Pt stood from w/c with cues for forward lean, making sure feet are stabilized,  and pushing up with R hand with CGA.   -used HW for stability with transfers to mat with only light CGA. Pt really did not need to use HW so on second transfer did not use it BUT then he had more of posterior lean. Pt has less of a posterior lean with HW as it guides him forward to keep more weight over front of foot vs heels.   - on mat pt needs multimodal cues to keep sitting upright and slightly forward as he is not able to hold balance long before leaning back.  Used multiple types of strategies (reaching forward for cones,  rocking stool back  and forth,  rolling ball back and forth) -had pt move into supine to work on LUE PROM.  Able to stretch arm with massage to full elbow extension,  able to work on wrist extension as pt's hypertone pull wrist into full flexion.  With PROM he has -30 wrist extension but able to get wrist slightly more neutral.  Hypertone very severe and unable to hold wrist into that position.  - sit to stands from [facility] CGA -standing with R hand on bar stool for support and pt practiced  standing balance with side to side steps, L foot forward and back and then R foot.   Pt taken back to room to toilet prior to group. Pt stated that at home he stands to urinate at the toilet. Positioned him in front of toilet , but pt pulled pants down over over pelvis before coming to stand despite cues so he accidentally started to urinate.  Pt cued to wait to pull pants and stand first but he anxiously pulled pants down.  Unfortunately urine spilled on floor and his pants.   Started to try to clean him up but my session time ran over so RN arrived to assist his mom with changing his clothing.   Therapy Documentation Precautions:  Precautions Precautions: Fall Recall of Precautions/Restrictions: Impaired Precaution/Restrictions Comments: Left AFO, h/o left hemiplegia Restrictions Weight Bearing Restrictions Per Provider Order: No   Pain: Pain Assessment Pain Scale: 0-10 Pain Score: 0-No pain ADL: ADL Eating: Set up Grooming: Setup Where Assessed-Grooming: Sitting at sink Upper Body Bathing: Supervision/safety Where Assessed-Upper Body Bathing: Shower Lower Body Bathing: Minimal assistance Where Assessed-Lower Body Bathing: Shower Upper Body Dressing: Supervision/safety Where Assessed-Upper Body Dressing: Sitting at sink Lower Body Dressing: Minimal assistance Where Assessed-Lower Body Dressing: Wheelchair Toileting: Minimal assistance Where Assessed-Toileting: Teacher, Adult Education: Curator Method: Surveyor, Minerals: Engineer, Technical Sales: Unable to assess Tub/Shower Transfer Method: Unable to assess [employer] Transfer: Minimal assistance Film/video Editor Method: Warden/ranger: Grab bars ADL Comments: improving with posterior lean but needs constant cues   Therapy/Group: Individual Therapy  Mignonne Afonso 10/17/2024, 12:33 PM

## 2024-10-17 NOTE — Progress Notes (Signed)
 Physical Therapy Session Note  Patient Details  Name: Nicholas Holt MRN: [id number] Date of Birth: 10/26/1981  Today's Date: 10/17/2024 PT Individual Time: 8695-8654 PT Individual Time Calculation (min): 41 min   Short Term Goals: Week 1:  PT Short Term Goal 1 (Week 1): Pt will transfer sup to sit w/ min A consistently from flat bed. PT Short Term Goal 2 (Week 1): Pt will transfer sit to stand w/ min A consistently. PT Short Term Goal 3 (Week 1): Pt will amb x 120' w/ min A and LRAD PT Short Term Goal 4 (Week 1): Pt will negotiate 4 steps w/ 1 rail and min A.  Skilled Therapeutic Interventions/Progress Updates: Patient sitting in [facility] with family present on entrance to room. Patient alert and agreeable to PT session.   Patient reported no pain during session. Pt mother asked if it was okay to have pt perform sitting balance while in WC or EOB. PTA encouraged this with added safety measure of having gait belt donned and for pt to focus on ability to maintain static sit as long as possible without leaning posteriorly. And if pt were to perform on EOB to lower other side of bed railing to avoid injury to neck or head if pt has posterior LOB.   Therapeutic Activity: Transfers: Pt performed sit<>stand transfers throughout session with min/heavy minA when adhering to anterior lean as pt continues to demonstrate retropulsion (has improved since evaluation).   Gait Training:  Pt ambulated roughly 95' x 2 using no AD with overall modA. Pt demonstrated the following gait deviations with therapist providing the described cuing and facilitation for improvement:  - pt with L AFO (personal) donned and with decreased heel strike L LE. Pt ambulates on forefront of foot with heel not touching the ground during gait cycle (this slightly improved after manual therapy and therex).   Therapeutic Exercise: Pt performed the following exercises with therapist providing the described cuing and facilitation for  improvement. - Contract relax of plantarflexors L LE between manual therapy  Manual Therapy: Palpation of L heel-cord/plantarflexors performed with tension noted. Education and rationale provided with pt agreeing to participate in intervention. - Soft tissue mobilization provided to stated area with observable increase in dorsiflexion ROM following manual therapy and therex (not formally assessed)   Patient sitting in WC at end of session with brakes locked, family present, and all needs within reach.      Therapy Documentation Precautions:  Precautions Precautions: Fall Recall of Precautions/Restrictions: Impaired Precaution/Restrictions Comments: Left AFO, h/o left hemiplegia Restrictions Weight Bearing Restrictions Per Provider Order: No  Therapy/Group: Individual Therapy  Anabeth Chilcott PTA 10/17/2024, 3:45 PM

## 2024-10-17 NOTE — Plan of Care (Signed)
  Problem: Consults Goal: RH STROKE PATIENT EDUCATION Description: See Patient Education module for education specifics  Outcome: Progressing   Problem: RH BOWEL ELIMINATION Goal: RH STG MANAGE BOWEL WITH ASSISTANCE Description: STG Manage Bowel with mod I Assistance. Outcome: Progressing Goal: RH STG MANAGE BOWEL W/MEDICATION W/ASSISTANCE Description: STG Manage Bowel with Medication with mod I Assistance. Outcome: Progressing   Problem: RH SAFETY Goal: RH STG ADHERE TO SAFETY PRECAUTIONS W/ASSISTANCE/DEVICE Description: STG Adhere to Safety Precautions With cues Assistance/Device. Outcome: Progressing   Problem: RH COGNITION-NURSING Goal: RH STG USES MEMORY AIDS/STRATEGIES W/ASSIST TO PROBLEM SOLVE Description: STG Uses Memory Aids/Strategies With cues Assistance to Problem Solve. Outcome: Progressing

## 2024-10-17 NOTE — Progress Notes (Signed)
 Speech Language Pathology Daily Session Note  Patient Details  Name: Nicholas Holt MRN: [id number] Date of Birth: 11-22-1980  Today's Date: 10/17/2024 SLP Individual Time: 1445-1530 SLP Individual Time Calculation (min): 45 min  Short Term Goals: Week 1: SLP Short Term Goal 1 (Week 1): Pt will utilize compensatory speech strategies at phrase level to maintain 75% intelligibility w/ maxA cues SLP Short Term Goal 2 (Week 1): Pt will utilize compensatory speech strategies at word level to maintain 85% intelligibility w/ maxA cues SLP Short Term Goal 3 (Week 1): Pt will utilize external memory aids to recall recent/relevant info w/ maxA SLP Short Term Goal 4 (Week 1): Pt will utilize external memory aids to be AOx3 w/ modA SLP Short Term Goal 5 (Week 1): Pt will solve functional problems w/ maxA  Skilled Therapeutic Interventions: Skilled therapy session focused on cognitive and communication goals. SLP facilitated session by targeting orientation. Initially, patient oriented to name and year, however unable to recall age and month. By the end of the session, [name] independently. SLP targeted communication through repetition of monosyllabic, bisyllabic and multisyllabic words. Patient required minA to utilize slow, loud and over articulate strategies to reach 100% intelligibility. SLP continued to challenge patient to utilize strategies at the phrase level. Patient required min-modA to slow rate of speech and increase volume to reach 85% intelligibility. Patient left in [facility] with family present. Continue POC  Pain None reported   Therapy/Group: Individual Therapy  Paco Cislo M.A., CCC-SLP 10/17/2024, 7:41 AM

## 2024-10-18 DIAGNOSIS — I639 Cerebral infarction, unspecified: Secondary | ICD-10-CM | POA: Diagnosis not present

## 2024-10-18 MED ORDER — BACLOFEN 5 MG HALF TABLET
5.0000 mg | ORAL_TABLET | Freq: Three times a day (TID) | ORAL | Status: DC
  Administered 2024-10-18 – 2024-10-24 (×18): 5 mg via ORAL
  Filled 2024-10-18 (×18): qty 1

## 2024-10-18 NOTE — Progress Notes (Signed)
 Orthopedic Tech Progress Note Patient Details:  Nicholas Holt 09-Jul-1981 [id number] Applied PRAFO boot to LLE per order.  Ortho Devices Type of Ortho Device: Prafo boot/shoe Ortho Device/Splint Location: LLE Ortho Device/Splint Interventions: Ordered, Application, Adjustment   Post Interventions Patient Tolerated: Well Instructions Provided: Adjustment of device, Care of device, Poper ambulation with device  Morna Pink 10/18/2024, 9:56 AM

## 2024-10-18 NOTE — Plan of Care (Signed)
  Problem: Consults Goal: RH STROKE PATIENT EDUCATION Description: See Patient Education module for education specifics  Outcome: Progressing   Problem: RH BOWEL ELIMINATION Goal: RH STG MANAGE BOWEL WITH ASSISTANCE Description: STG Manage Bowel with mod I Assistance. Outcome: Progressing Goal: RH STG MANAGE BOWEL W/MEDICATION W/ASSISTANCE Description: STG Manage Bowel with Medication with mod I Assistance. Outcome: Progressing   Problem: RH SAFETY Goal: RH STG ADHERE TO SAFETY PRECAUTIONS W/ASSISTANCE/DEVICE Description: STG Adhere to Safety Precautions With cues Assistance/Device. Outcome: Progressing   Problem: RH COGNITION-NURSING Goal: RH STG USES MEMORY AIDS/STRATEGIES W/ASSIST TO PROBLEM SOLVE Description: STG Uses Memory Aids/Strategies With cues Assistance to Problem Solve. Outcome: Progressing   Problem: RH KNOWLEDGE DEFICIT Goal: RH STG INCREASE KNOWLEDGE OF HYPERTENSION Description: Patient and parents will be able to manage HTN using educational resources for medications and dietary modification independently Outcome: Progressing Goal: RH STG INCREASE KNOWLEGDE OF HYPERLIPIDEMIA Description: Patient and parents will be able to manage HLD using educational resources for medications and dietary modification independently Outcome: Progressing Goal: RH STG INCREASE KNOWLEDGE OF STROKE PROPHYLAXIS Description: Patient and parents will be able to manage secondary risks using educational resources for medications and dietary modification independently Outcome: Progressing

## 2024-10-18 NOTE — Progress Notes (Signed)
 Physical Therapy Weekly Progress Note  Patient Details  Name: Nicholas Holt MRN: [id number] Date of Birth: 1980/12/09  Beginning of progress report period: October 11, 2024 End of progress report period: October 18, 2024  Patient has met 0 of 4 short term goals. Pt is progressing towards all goes of overall minA with transfers, ambulation, bed mobility and stair navigation. Pt currently requires close to [facility] with ambulating (has ambulated greater than 150') without AD (have not introduced RW due to flexor tone in L UE) due to decreased ability to maintain L step clearance and weight shift to R, safety awareness and poor reaction time. Pt can perform sit<>stands with minA but is not consistent and still presents with retropulsion (has improved since evaluation and is able to be minA when adhering to anterior weight shift cue). Pt has not performed stairs this week since evaluation due to primary focus being dynamic standing/sitting balance and weight shift and gait.   Patient continues to demonstrate the following deficits {impairments:3041632} and therefore will continue to benefit from skilled PT intervention to increase functional independence with mobility.  Patient {LTG progression:3041653}.  {plan of rjmz:6958345}  PT Short Term Goals Week 1:  PT Short Term Goal 1 (Week 1): Pt will transfer sup to sit w/ min A consistently from flat bed. PT Short Term Goal 1 - Progress (Week 1): Progressing toward goal PT Short Term Goal 2 (Week 1): Pt will transfer sit to stand w/ min A consistently. PT Short Term Goal 2 - Progress (Week 1): Progressing toward goal PT Short Term Goal 3 (Week 1): Pt will amb x 120' w/ min A and LRAD PT Short Term Goal 3 - Progress (Week 1): Progressing toward goal PT Short Term Goal 4 (Week 1): Pt will negotiate 4 steps w/ 1 rail and min A. PT Short Term Goal 4 - Progress (Week 1): Not met  Skilled Therapeutic Interventions/Progress Updates:      Therapy  Documentation Precautions:  Precautions Precautions: Fall Recall of Precautions/Restrictions: Impaired Precaution/Restrictions Comments: Left AFO, h/o left hemiplegia Restrictions Weight Bearing Restrictions Per Provider Order: No  Sakina Briones PTA   10/18/2024, 12:55 PM

## 2024-10-18 NOTE — Progress Notes (Signed)
 PROGRESS NOTE   Subjective/Complaints:  No events overnight. Mom at bedside, states patient having more spasms in his left quad and calf at nighttime. Note increasing tone, especially in his ankle and toes.   Vitals stable      10/18/2024    5:53 AM 10/17/2024    9:47 PM 10/17/2024    1:47 PM  Vitals with BMI  Systolic 117 138 869  Diastolic 73 84 84  Pulse 71 81 87    No results for input(s): GLUCAP in the last 72 hours.   P.o. intakes appropriate   ?Continent of bladder  - mother reporting movements Last BM 12/5   ROS: as per HPI. Denies CP, SOB, abd pain, N/V/D, or any other complaints at this time.   +spasms  Objective:   No results found.  Recent Labs    10/16/24 0507  WBC 6.1  HGB 12.2*  HCT 37.2*  PLT 169   Recent Labs    10/16/24 0507  NA 139  K 4.0  CL 105  CO2 27  GLUCOSE 86  BUN 12  CREATININE 1.07  CALCIUM  8.6*        Intake/Output Summary (Last 24 hours) at 10/18/2024 0912 Last data filed at 10/18/2024 0739 Gross per 24 hour  Intake 358 ml  Output --  Net 358 ml         Physical Exam: Vital Signs Blood pressure 117/73, pulse 71, temperature 97.9 F (36.6 C), resp. rate 18, height 5' 8 (1.727 m), weight 87.8 kg, SpO2 100%.  General: No acute distress. Sitting in chair Mood and affect are appropriate Heart: Regular rate and rhythm no rubs murmurs or extra sounds Lungs: Clear to auscultation, breathing unlabored, no rales or wheezes Abdomen: Positive bowel sounds, soft nontender to palpation, nondistended Extremities: No clubbing, cyanosis, or edema Skin: No evidence of breakdown, no evidence of rash  Musculoskeletal:     Cervical back: Neck supple.     Comments: RUE- 4+ to 5-/5 throughout RLE- 4+/5 throughout  LUE- biceps and triceps 3-/5, FF 4/5, FA 4-/5; WE 0 contracted with increased elbow and wrist flexor tone  LLE- HF 3-/5; KE 4-/5; KF 3-/5; DF 2-/5 and PF 2-/5    Likely contracture L wrist, L EF MAS 2; flexion contractures L toes, MAS 2 L PF, MAS 1 KF  Alien arm, mild left,  Sensation to LT intact   Skin:    General: Skin is warm and dry.   Neurological:  moderately dysarthric, difficult to understand  Somewhat Ataxic with arms as well B/L   Assessment/Plan: 1. Functional deficits which require 3+ hours per day of interdisciplinary therapy in a comprehensive inpatient rehab setting. Physiatrist is providing close team supervision and 24 hour management of active medical problems listed below. Physiatrist and rehab team continue to assess barriers to discharge/monitor patient progress toward functional and medical goals  Care Tool:  Bathing    Body parts bathed by patient: Left arm, Chest, Abdomen, Front perineal area, Right upper leg, Left upper leg, Face, Right arm, Buttocks   Body parts bathed by helper: Right lower leg, Left lower leg     Bathing assist Assist Level: Minimal  Assistance - Patient > 75%     Upper Body Dressing/Undressing Upper body dressing   What is the patient wearing?: Pull over shirt    Upper body assist Assist Level: Supervision/Verbal cueing    Lower Body Dressing/Undressing Lower body dressing      What is the patient wearing?: Underwear/pull up, Pants     Lower body assist Assist for lower body dressing: Minimal Assistance - Patient > 75%     Toileting Toileting Toileting Activity did not occur (Clothing management and hygiene only): N/A (no void or bm)  Toileting assist Assist for toileting: Moderate Assistance - Patient 50 - 74%     Transfers Chair/bed transfer  Transfers assist     Chair/bed transfer assist level: Moderate Assistance - Patient 50 - 74%     Locomotion Ambulation   Ambulation assist      Assist level: Moderate Assistance - Patient 50 - 74% Assistive device: Hand held assist Max distance: 110   Walk 10 feet activity   Assist     Assist level: Moderate  Assistance - Patient - 50 - 74% Assistive device: Hand held assist   Walk 50 feet activity   Assist    Assist level: Moderate Assistance - Patient - 50 - 74% Assistive device: Hand held assist    Walk 150 feet activity   Assist    Assist level: Moderate Assistance - Patient - 50 - 74% Assistive device: Hand held assist    Walk 10 feet on uneven surface  activity   Assist     Assist level: Moderate Assistance - Patient - 50 - 74% Assistive device: Hand held assist   Wheelchair     Assist Is the patient using a wheelchair?: Yes Type of Wheelchair: Manual    Wheelchair assist level: Dependent - Patient 0% Max wheelchair distance: 150    Wheelchair 50 feet with 2 turns activity    Assist        Assist Level: Dependent - Patient 0%   Wheelchair 150 feet activity     Assist      Assist Level: Dependent - Patient 0%   Blood pressure 117/73, pulse 71, temperature 97.9 F (36.6 C), resp. rate 18, height 5' 8 (1.727 m), weight 87.8 kg, SpO2 100%.  Medical Problem List and Plan: 1. Functional deficits secondary to L inferior basal ganglia stroke (posterior limb of internal capsule)             -patient may  shower             -ELOS/Goals: 12/15 min A to mod I  -Continue CIR   - 12/6: Add LLE PRAFO for contracture prevention, PF tone - has AFO  2.  Antithrombotics: -DVT/anticoagulation:  Pharmaceutical: Lovenox  40mg  daily             -antiplatelet therapy: DAPT X 3 weeks followed by Plavix  alone.  3. Pain Management: Tylenol  and flexeril  prn.  4. Mood/Behavior/Sleep: LCSW to follow for evaluation and support             --ativan  6mg  at nights for sleep/nightmares             -antipsychotic agents: N/A 5. Neuropsych/cognition: This patient is not fully capable of making decisions on his own behalf. 6. Skin/Wound Care: Routine pressure relief measures.  7. Fluids/Electrolytes/Nutrition: Monitor I/O. Routine labs. Continue Klor CR 10mEq BID 8.  Panhypopituitarism: On hydrocortisone  5mg  daily, levothyroxine  100mcg daily, and DDAVP  0.2mg  TID-->followed by Dr. Faythe Currently DDAVP   is 0.1mg  QID  9. Low grade fever: resolved CXR and UA neg 10.  HTN: Monitor BP tid--on Losartan  25mg  daily  -12/2 BPs fine, monitor  Vitals:   10/14/24 1519 10/14/24 1942 10/15/24 0550 10/15/24 1332  BP: 136/89 127/76 116/74 126/80   10/15/24 2007 10/16/24 0413 10/16/24 1408 10/16/24 2011  BP: 126/80 97/60 126/67 (!) 142/88   10/17/24 0600 10/17/24 1347 10/17/24 2147 10/18/24 0553  BP: 116/68 130/84 138/84 117/73    11.  Hypernatremia: Resolved Sodium trending back up and DDAVP  increased to 2 mg TID today, Mom prefers intranasal route for pt thinks it controls urination better , will change to home dose and monitor 0.1 QID, (sometimes used .2mg  at noc)       Latest Ref Rng & Units 10/16/2024    5:07 AM 10/13/2024    4:58 AM 10/11/2024    5:36 AM  BMP  Glucose 70 - 99 mg/dL 86  888  887   BUN 6 - 20 mg/dL 12  10  12    Creatinine 0.61 - 1.24 mg/dL 8.92  9.00  8.64   Sodium 135 - 145 mmol/L 139  139  142   Potassium 3.5 - 5.1 mmol/L 4.0  3.7  4.1   Chloride 98 - 111 mmol/L 105  103  108   CO2 22 - 32 mmol/L 27  25  26    Calcium  8.9 - 10.3 mg/dL 8.6  8.6  8.9   Recheck BMP stable cont nasal DDAVP   12. Leucocytosis: WBC of 10.6- Monitor for fevers and other signs of infection  -10/11/24 down to 5.7 today, monitor  13.  H/o Supersella germinoma: Hx of left hemiparesis, cognitive deficits and seizures in the past (no meds)  14. Mild OSA: Unable to tolerate CPAP 15. Mild constipation- pt goes daily at home- might need to intervene if hasn't gone by tomorrow.  -10/11/24 no BM in 3 days, will give MoM 15ml once. Pt on Senokot S 2 tabs daily.  -10/12/24 LBM yesterday with MoM-- mom wants to leave meds alone though, monitor for further Bms  - LBM 12/5  16. HLD: continue Rosuvastatin  20mg  daily  17.  Sapsticity - LUE and LLE  - Left wrist AND toe  contracture vs spasticity consider botulinum toxin injection - discussed with pt and his mother, mom is in favor , pt unsure  - 12/6: Add Baclofen  5 mg TID; has PRN flexeril . Bracing as above  LOS: 8 days A FACE TO FACE EVALUATION WAS PERFORMED  Nicholas Holt Likes 10/18/2024, 9:12 AM

## 2024-10-19 DIAGNOSIS — I639 Cerebral infarction, unspecified: Secondary | ICD-10-CM | POA: Diagnosis not present

## 2024-10-19 MED ORDER — MUSCLE RUB 10-15 % EX CREA
TOPICAL_CREAM | CUTANEOUS | Status: DC | PRN
  Filled 2024-10-19 (×2): qty 85

## 2024-10-19 NOTE — Plan of Care (Signed)
  Problem: RH SAFETY Goal: RH STG ADHERE TO SAFETY PRECAUTIONS W/ASSISTANCE/DEVICE Description: STG Adhere to Safety Precautions With cues Assistance/Device. Outcome: Progressing   Problem: RH COGNITION-NURSING Goal: RH STG USES MEMORY AIDS/STRATEGIES W/ASSIST TO PROBLEM SOLVE Description: STG Uses Memory Aids/Strategies With cues Assistance to Problem Solve. Outcome: Progressing

## 2024-10-19 NOTE — Progress Notes (Addendum)
 PROGRESS NOTE   Subjective/Complaints:  No events overnight.  Patient has no complaints, feeling well today.  Vitals are stable. Mom thinks he is avoiding putting weight on his left foot, states he is dragging it with ambulation.  She does not think this was the case last week, states she was under the understanding that he was walking with both extremities.  Patient denies any pain in the right leg.  Spasms little better.  ROS: as per HPI. Denies CP, SOB, abd pain, N/V/D, or any other complaints at this time.   +spasms--improved  Objective:   No results found.  No results for input(s): WBC, HGB, HCT, PLT in the last 72 hours.  No results for input(s): NA, K, CL, CO2, GLUCOSE, BUN, CREATININE, CALCIUM  in the last 72 hours.       Intake/Output Summary (Last 24 hours) at 10/19/2024 1349 Last data filed at 10/19/2024 0843 Gross per 24 hour  Intake 473 ml  Output --  Net 473 ml         Physical Exam: Vital Signs Blood pressure 109/67, pulse 68, temperature (!) 97.5 F (36.4 C), resp. rate 18, height 5' 8 (1.727 m), weight 87.8 kg, SpO2 100%.  General: No acute distress.  Laying in bed. Mood and affect are appropriate Heart: Regular rate and rhythm no rubs murmurs or extra sounds Lungs: Clear to auscultation, breathing unlabored, no rales or wheezes Abdomen: Positive bowel sounds, soft nontender to palpation, nondistended Extremities: No clubbing, cyanosis, or edema Skin: No evidence of breakdown, no evidence of rash  MSK: No tenderness to palpation throughout left lower extremity, dorsal plantar foot, or ankle squeeze.  Full passive range of motion.  No crepitus or deformity.  Neuro: Awake, alert.  Follows commands appropriately. moderately dysarthric, difficult to understand  Somewhat Ataxic with arms as well B/L RUE- 4+ to 5-/5 throughout RLE- 4+/5 throughout  LUE- biceps and triceps  3-/5, FF 4/5, FA 4-/5; WE 0 contracted with increased elbow and wrist flexor tone  LLE- HF 3-/5; KE 4-/5; KF 3-/5; DF 2-/5 and PF 2-/5   Likely contracture L wrist, L EF MAS 1+; flexion contractures L toes, MAS 2 L PF, MAS 1 KF  Alien arm, mild left,  Sensation to LT intact   Skin:    General: Skin is warm and dry.    Physical exam unchanged from the above on reexamination 10/19/24     Assessment/Plan: 1. Functional deficits which require 3+ hours per day of interdisciplinary therapy in a comprehensive inpatient rehab setting. Physiatrist is providing close team supervision and 24 hour management of active medical problems listed below. Physiatrist and rehab team continue to assess barriers to discharge/monitor patient progress toward functional and medical goals  Care Tool:  Bathing    Body parts bathed by patient: Left arm, Chest, Abdomen, Front perineal area, Right upper leg, Left upper leg, Face, Right arm, Buttocks   Body parts bathed by helper: Right lower leg, Left lower leg     Bathing assist Assist Level: Minimal Assistance - Patient > 75%     Upper Body Dressing/Undressing Upper body dressing   What is the patient wearing?: Pull over shirt  Upper body assist Assist Level: Supervision/Verbal cueing    Lower Body Dressing/Undressing Lower body dressing      What is the patient wearing?: Underwear/pull up, Pants     Lower body assist Assist for lower body dressing: Minimal Assistance - Patient > 75%     Toileting Toileting Toileting Activity did not occur (Clothing management and hygiene only): N/A (no void or bm)  Toileting assist Assist for toileting: Moderate Assistance - Patient 50 - 74%     Transfers Chair/bed transfer  Transfers assist     Chair/bed transfer assist level: Moderate Assistance - Patient 50 - 74%     Locomotion Ambulation   Ambulation assist      Assist level: Moderate Assistance - Patient 50 - 74% Assistive device:  Hand held assist Max distance: 110   Walk 10 feet activity   Assist     Assist level: Moderate Assistance - Patient - 50 - 74% Assistive device: Hand held assist   Walk 50 feet activity   Assist    Assist level: Moderate Assistance - Patient - 50 - 74% Assistive device: Hand held assist    Walk 150 feet activity   Assist    Assist level: Moderate Assistance - Patient - 50 - 74% Assistive device: Hand held assist    Walk 10 feet on uneven surface  activity   Assist     Assist level: Moderate Assistance - Patient - 50 - 74% Assistive device: Hand held assist   Wheelchair     Assist Is the patient using a wheelchair?: Yes Type of Wheelchair: Manual    Wheelchair assist level: Dependent - Patient 0% Max wheelchair distance: 150    Wheelchair 50 feet with 2 turns activity    Assist        Assist Level: Dependent - Patient 0%   Wheelchair 150 feet activity     Assist      Assist Level: Dependent - Patient 0%   Blood pressure 109/67, pulse 68, temperature (!) 97.5 F (36.4 C), resp. rate 18, height 5' 8 (1.727 m), weight 87.8 kg, SpO2 100%.  Medical Problem List and Plan: 1. Functional deficits secondary to L inferior basal ganglia stroke (posterior limb of internal capsule)             -patient may  shower             -ELOS/Goals: 12/15 min A to mod I  -Continue CIR   - 12/6: Add LLE PRAFO for contracture prevention, PF tone - has AFO--tolerating well  12-7: Mom concerned about worsening function of left lower extremity; no notable changes on exam, patient denies pain in the foot, mom thinks he is purposefully offloading it.  Per PT, does have ongoing difficulty achieving heel strike with AFO consistently, no notable functional decline.  2.  Antithrombotics: -DVT/anticoagulation:  Pharmaceutical: Lovenox  40mg  daily             -antiplatelet therapy: DAPT X 3 weeks followed by Plavix  alone.  3. Pain Management: Tylenol  and flexeril   prn.  4. Mood/Behavior/Sleep: LCSW to follow for evaluation and support             --ativan  6mg  at nights for sleep/nightmares             -antipsychotic agents: N/A 5. Neuropsych/cognition: This patient is not fully capable of making decisions on his own behalf. 6. Skin/Wound Care: Routine pressure relief measures.  7. Fluids/Electrolytes/Nutrition: Monitor I/O. Routine labs. Continue Klor CR  10mEq BID 8. Panhypopituitarism: On hydrocortisone  5mg  daily, levothyroxine  100mcg daily, and DDAVP  0.2mg  TID-->followed by Dr. Faythe Currently DDAVP  is 0.1mg  QID  9. Low grade fever: resolved CXR and UA neg 10.  HTN: Monitor BP tid--on Losartan  25mg  daily  -  BPs fine, monitor  Vitals:   10/15/24 1332 10/15/24 2007 10/16/24 0413 10/16/24 1408  BP: 126/80 126/80 97/60 126/67   10/16/24 2011 10/17/24 0600 10/17/24 1347 10/17/24 2147  BP: (!) 142/88 116/68 130/84 138/84   10/18/24 0553 10/18/24 1336 10/18/24 2040 10/19/24 0553  BP: 117/73 131/88 (!) 144/77 109/67    11.  Hypernatremia: Resolved Sodium trending back up and DDAVP  increased to 2 mg TID today, Mom prefers intranasal route for pt thinks it controls urination better , will change to home dose and monitor 0.1 QID, (sometimes used .2mg  at noc)       Latest Ref Rng & Units 10/16/2024    5:07 AM 10/13/2024    4:58 AM 10/11/2024    5:36 AM  BMP  Glucose 70 - 99 mg/dL 86  888  887   BUN 6 - 20 mg/dL 12  10  12    Creatinine 0.61 - 1.24 mg/dL 8.92  9.00  8.64   Sodium 135 - 145 mmol/L 139  139  142   Potassium 3.5 - 5.1 mmol/L 4.0  3.7  4.1   Chloride 98 - 111 mmol/L 105  103  108   CO2 22 - 32 mmol/L 27  25  26    Calcium  8.9 - 10.3 mg/dL 8.6  8.6  8.9   Recheck BMP stable cont nasal DDAVP   12. Leucocytosis: WBC of 10.6- Monitor for fevers and other signs of infection  -10/11/24 down to 5.7 today, monitor  13.  H/o Supersella germinoma: Hx of left hemiparesis, cognitive deficits and seizures in the past (no meds)  14. Mild OSA: Unable  to tolerate CPAP 15. Mild constipation- pt goes daily at home- might need to intervene if hasn't gone by tomorrow.  -10/11/24 no BM in 3 days, will give MoM 15ml once. Pt on Senokot S 2 tabs daily.  -10/12/24 LBM yesterday with MoM-- mom wants to leave meds alone though, monitor for further Bms  - LBM 12/5  16. HLD: continue Rosuvastatin  20mg  daily  17.  Sapsticity - LUE and LLE  - Left wrist AND toe contracture vs spasticity consider botulinum toxin injection - discussed with pt and his mother, mom is in favor , pt unsure  - 12/6: Add Baclofen  5 mg TID; has PRN flexeril . Bracing as above  - 12/7: Some improvement in spasms, mild improvement in elbow flexor tone.  Continue current regimen.  LOS: 9 days A FACE TO FACE EVALUATION WAS PERFORMED  Nicholas Holt Likes 10/19/2024, 1:49 PM

## 2024-10-20 ENCOUNTER — Other Ambulatory Visit: Payer: Self-pay | Admitting: Internal Medicine

## 2024-10-20 DIAGNOSIS — E785 Hyperlipidemia, unspecified: Secondary | ICD-10-CM

## 2024-10-20 DIAGNOSIS — G8194 Hemiplegia, unspecified affecting left nondominant side: Secondary | ICD-10-CM | POA: Diagnosis not present

## 2024-10-20 DIAGNOSIS — E230 Hypopituitarism: Secondary | ICD-10-CM | POA: Diagnosis not present

## 2024-10-20 DIAGNOSIS — I639 Cerebral infarction, unspecified: Secondary | ICD-10-CM | POA: Diagnosis not present

## 2024-10-20 LAB — CBC
HCT: 38.7 % — ABNORMAL LOW (ref 39.0–52.0)
Hemoglobin: 12.7 g/dL — ABNORMAL LOW (ref 13.0–17.0)
MCH: 30 pg (ref 26.0–34.0)
MCHC: 32.8 g/dL (ref 30.0–36.0)
MCV: 91.3 fL (ref 80.0–100.0)
Platelets: 184 K/uL (ref 150–400)
RBC: 4.24 MIL/uL (ref 4.22–5.81)
RDW: 15.7 % — ABNORMAL HIGH (ref 11.5–15.5)
WBC: 6.2 K/uL (ref 4.0–10.5)
nRBC: 0 % (ref 0.0–0.2)

## 2024-10-20 LAB — BASIC METABOLIC PANEL WITH GFR
Anion gap: 9 (ref 5–15)
BUN: 12 mg/dL (ref 6–20)
CO2: 27 mmol/L (ref 22–32)
Calcium: 8.7 mg/dL — ABNORMAL LOW (ref 8.9–10.3)
Chloride: 104 mmol/L (ref 98–111)
Creatinine, Ser: 1.13 mg/dL (ref 0.61–1.24)
GFR, Estimated: >60 mL/min (ref >60)
Glucose, Bld: 88 mg/dL (ref 70–99)
Potassium: 4.5 mmol/L (ref 3.5–5.1)
Sodium: 140 mmol/L (ref 135–145)

## 2024-10-20 MED ORDER — ADULT MULTIVITAMIN W/MINERALS CH
1.0000 | ORAL_TABLET | Freq: Every day | ORAL | Status: DC
  Administered 2024-10-20 – 2024-10-31 (×12): 1 via ORAL
  Filled 2024-10-20 (×13): qty 1

## 2024-10-20 NOTE — Progress Notes (Signed)
 Orthopedic Tech Progress Note Patient Details:  Nicholas Holt [birth year] [id number]  Patient ID: Nicholas Holt, male   DOB: 07-21-1981, 43 y.o.   MRN: [id number] [facility] called for AFO modification order  Jonica Bickhart OTR/L 10/20/2024, 8:12 AM

## 2024-10-20 NOTE — Progress Notes (Signed)
 Occupational Therapy Session Note  Patient Details  Name: Nicholas Holt MRN: [id number] Date of Birth: 29-Oct-1981  Today's Date: 10/20/2024 OT Individual Time: 8592-8551 OT Individual Time Calculation (min): 41 min    Short Term Goals: Week 2:  OT Short Term Goal 1 (Week 2): Pt will be able to hold static stand for 30 seconds with no posterior lean to increase safety and ability to adjust clothing pre and post toileting. OT Short Term Goal 2 (Week 2): Pt will be able to don pants over feet with supervision. OT Short Term Goal 3 (Week 2): pt will complete toilet transfers with LRAD with CGA to close S.  Skilled Therapeutic Interventions/Progress Updates:  Pt greeted seated in recliner, pt agreeable to OT intervention.      Transfers/bed mobility/functional mobility:  Pt completed stand pivot transfers from recliner<>w/c with R HHA with MINA, decreased stride length noted in LLE.  Therapeutic activity:  Pt completed various standing balance tasks with a focus on reactive balance and improvements in anterior weight shifts during sit>stands. Pt instructed to stand from w/c and then toss bean bags to target. Pt completed task with MIN A for sit>stands but fading to CGA with increased reps.   Graded task up and instructed pt to use BUEs to toss ball to target in standing to facilitate improved bilateral coordination. Pt completed task with MINA for balance.   Exercises:  Pt completed alternating step ups to 2 inch step with BUE support from hand rails to facilitate improved LLE motor planning and to promote NMRE to LLE. Pt completed task with CGA for balance.   Graded task up and instructed pt to step up on step and then back down using RLE to step up first and step off with LLE to facilitate improved motor planning in LLE. Pt with decreased ability to position LLE fully on step needing increased time and MIN verbal cues. Pt completed task with CGA for balance.                  Ended  session with pt seated in recliner with all needs within reach.   Therapy Documentation Precautions:  Precautions Precautions: Fall Recall of Precautions/Restrictions: Impaired Precaution/Restrictions Comments: Left AFO, h/o left hemiplegia Restrictions Weight Bearing Restrictions Per Provider Order: No  Pain: No pain    Therapy/Group: Individual Therapy  [name] 10/20/2024, 3:45 PM

## 2024-10-20 NOTE — Progress Notes (Signed)
 Physical Therapy Session Note  Patient Details  Name: Nicholas Holt MRN: [id number] Date of Birth: 03/15/1981  Today's Date: 10/20/2024 PT Individual Time: 1103-1200 PT Individual Time Calculation (min): 57 min   Short Term Goals: Week 2:  PT Short Term Goal 1 (Week 2): STG = LTG  Skilled Therapeutic Interventions/Progress Updates: Pt presents sitting in w/c and agreeable to therapy.  Pt verbalizes but intelligible x 50% of time.  Pt wheeled to main gym.  Pt performed sit to stand blocks x 5 w/ min and occasional mod w/o cueing for forward lean.  Pt performs standing hooking horseshoes over BBALL hoop w/ 2 2/2 platform under R foot for increased emphasis on LLE.  Pt w/ retropulsion and improves w/ cues but reverts w/o carryover.  Pt performed standing shooting/placing ball in BBALL hoop w/ cues for maintaining WB through forefoot.  Pt amb multiple trials w/ mod A and w/o AD and cues for LLE advancement w/ reverting back to forefoot WB only/decreased advancement.  Pt performed seated picking up cones from floor and to sides for increased trunk flexion.  Pt negotiated w/c back to room w/ B feet  and mod A from PT.  Pt remained sitting in w/c w/ parents present.     Therapy Documentation Precautions:  Precautions Precautions: Fall Recall of Precautions/Restrictions: Impaired Precaution/Restrictions Comments: Left AFO, h/o left hemiplegia Restrictions Weight Bearing Restrictions Per Provider Order: No General:   Vital Signs:   Pain:0/10 Pain Assessment Pain Score: 0-No pain :      Therapy/Group: Individual Therapy  [name] P Datrell Dunton 10/20/2024, 12:17 PM

## 2024-10-20 NOTE — Plan of Care (Signed)
 Goals upgraded due to progress Problem: RH Expression Communication Goal: LTG Patient will increase speech intelligibility (SLP) Description: LTG: Patient will increase speech intelligibility at word/phrase/conversation level with cues, % of the time (SLP) Flowsheets (Taken 10/20/2024 1101) LTG: Patient will increase speech intelligibility (SLP): Minimal Assistance - Patient > 75% Level: (sentence) -- Percent of time patient will use intelligible speech: 90

## 2024-10-20 NOTE — Progress Notes (Signed)
 Speech Language Pathology Weekly Progress and Session Note  Patient Details  Name: Nicholas Holt MRN: [id number] Date of Birth: 11-16-1980  Beginning of progress report period: October 11, 2024 End of progress report period: October 20, 2024  Today's Date: 10/20/2024 SLP Individual Time: 1000-1059 SLP Individual Time Calculation (min): 59 min  Short Term Goals: Week 1: SLP Short Term Goal 1 (Week 1): Pt will utilize compensatory speech strategies at phrase level to maintain 75% intelligibility w/ maxA cues SLP Short Term Goal 1 - Progress (Week 1): Met SLP Short Term Goal 2 (Week 1): Pt will utilize compensatory speech strategies at word level to maintain 85% intelligibility w/ maxA cues SLP Short Term Goal 2 - Progress (Week 1): Met SLP Short Term Goal 3 (Week 1): Pt will utilize external memory aids to recall recent/relevant info w/ maxA SLP Short Term Goal 3 - Progress (Week 1): Met SLP Short Term Goal 4 (Week 1): Pt will utilize external memory aids to be AOx3 w/ modA SLP Short Term Goal 4 - Progress (Week 1): Met SLP Short Term Goal 5 (Week 1): Pt will solve functional problems w/ maxA SLP Short Term Goal 5 - Progress (Week 1): Partly met    New Short Term Goals: Week 2: SLP Short Term Goal 1 (Week 2): STG = LTG due to ELOS  Weekly Progress Updates: Pt has made good gains and has met 4 of 5 STGs this reporting period due to improved cognition and communication. Currently, patient continues to require mod A for use of speech intelligibility strategies to use at the single word and short phrase level. Patient is now oriented x4 with use of external aid, however continues to demonstrate deficits in memory and problem solving.  Pt/family eduction ongoing. Pt would benefit from continued ST intervention to maximize communication and cognition in order to maximize functional independence at d/c.    Intensity: Minumum of 1-2 x/day, 30 to 90 minutes Frequency: 3 to 5 out of 7  days Duration/Length of Stay: 12/15 Treatment/Interventions: Cognitive remediation/compensation;Environmental controls;Cueing hierarchy;Functional tasks;Therapeutic Activities;Internal/external aids;Patient/family education;Speech/Language facilitation   Daily Session  Skilled Therapeutic Interventions:  Skilled therapy session focused on cognitive and communicative goals. SLP facilitated session by targeting orientation task. Patient independently oriented to self, age, location and situation. Patient required use of external aid to orient to year, month and date. SLP then targeted communication goals through review of SLOP speech intelligibility strategies. Patient required minA to reach 90% intelligibility at the trisyllabic word level. SLP continued to challenge patient at the phrase level. Patient reached 85-90% intelligibility at the phrase level with minA and use of SLOP speech strategies and pacing board. Patient benefited from visual cues to slow rate of speech. SLP targeted memory at the end of the session by prompting patient to recall therapists name after a 10 minute delay. Patient recalled 4/4 names with minA - however with difficulty recalling events of morning. Patient left in [facility] with alarm set and call bell in reach. Continue POC      Pain None reported   Therapy/Group: Individual Therapy  Jurney Overacker M.A., CCC-SLP 10/20/2024, 10:51 AM

## 2024-10-20 NOTE — Progress Notes (Signed)
 Occupational Therapy Session Note  Patient Details  Name: Nicholas Holt MRN: [id number] Date of Birth: [birth year]  Today's Date: 10/20/2024 OT Individual Time: 9164-9076 OT Individual Time Calculation (min): 48 min    Short Term Goals: Week 1:  OT Short Term Goal 1 (Week 1): Patient will sit unsupported at edge of bed x 15 seconds as needed to prepare for level surface transfer to chair. OT Short Term Goal 1 - Progress (Week 1): Progressing toward goal ((he is sitting unsupported for 5-10 seconds at the most, unless his R hand is on a stedy surface in front of him with frequent cues to not let go of surface)) OT Short Term Goal 2 (Week 1): Patient will sufficiently lean forward while seated to safely reach to feet during bathing. OT Short Term Goal 2 - Progress (Week 1): Met OT Short Term Goal 3 (Week 1): Patient will transition to standing from sitting with min assist as needed to pull up/down clothing during BADL. OT Short Term Goal 3 - Progress (Week 1): Met Week 2:  OT Short Term Goal 1 (Week 2): Pt will be able to hold static stand for 30 seconds with no posterior lean to increase safety and ability to adjust clothing pre and post toileting. OT Short Term Goal 2 (Week 2): Pt will be able to don pants over feet with supervision. OT Short Term Goal 3 (Week 2): pt will complete toilet transfers with LRAD with CGA to close S.  Skilled Therapeutic Interventions/Progress Updates:    Pt received in bathroom with his parents assisting him, once he finished his parents helped him ambulate to recliner.  His mother was disappointed that he did not have any therapy over the weekend as his Saturday therapy session cancelled.  She said he has not had any ambulation training but she got him out of bed several times herself.   Pt sitting in recliner with difficulty holding upright balance.  Cued pt to come scoot close to edge of seat and plant his feet to stand. Pt started to stand but then leaned  shoulders back pushing him posteriorly. Tried 2nd time with cue to keep his gaze toward the floor.  Pt did well with this standing with only slight CGA.  Transferred to wc with HHA and cues to fully step L foot.    In gym: -W/c to mat to chair stand pivots CGA and STS CGA -sit balance at EOM with reduced posterior lean He can hold static sit for longer periods of time if he is engaged in a task in front of him, if he is just sitting then he leans back. -static standing at mat with cues to step forward so his legs are not using mat for support. Had pt engaged in a lateral and forward reaching task and pt stood without significant posterior lean for 6 minutes. He needed occasional light tactile cues as he would lean back slightly.   Stood a second time for 4 minutes.  Excellent effort.  -ambulated with min A and mod cues about 10 ft from mat to arm chair -cues for advancing L foot and leading with heel -because pt had minimal ambulation therapy with PT this weekend, another PT agreeable to walking with pt and me from gym to his room (about 100 + ft).  Min A and mod cues to pause and reset as pt would walk 5-8 ft and then start to speed up catching his L toe. Did not have shoe cover with  me to put on his L shoe.  Pt returned to room to sit in recliner with mom present.    Therapy Documentation Precautions:  Precautions Precautions: Fall Recall of Precautions/Restrictions: Impaired Precaution/Restrictions Comments: Left AFO, h/o left hemiplegia Restrictions Weight Bearing Restrictions Per Provider Order: No  Vital Signs: Therapy Vitals Temp: 97.8 F (36.6 C) Pulse Rate: 76 Resp: 16 BP: 112/74 Patient Position (if appropriate): Lying Oxygen Therapy SpO2: 99 % O2 Device: Room Air Pain: Pain Assessment Pain Scale: 0-10 Pain Score: 0-No pain ADL: ADL Eating: Set up Grooming: Setup Where Assessed-Grooming: Sitting at sink Upper Body Bathing: Supervision/safety Where Assessed-Upper  Body Bathing: Shower Lower Body Bathing: Minimal assistance Where Assessed-Lower Body Bathing: Shower Upper Body Dressing: Supervision/safety Where Assessed-Upper Body Dressing: Sitting at sink Lower Body Dressing: Minimal assistance Where Assessed-Lower Body Dressing: Wheelchair Toileting: Minimal assistance Where Assessed-Toileting: Teacher, Adult Education: Curator Method: Surveyor, Minerals: Engineer, Technical Sales: Unable to assess Tub/Shower Transfer Method: Unable to assess [employer] Transfer: Minimal assistance Film/video Editor Method: Warden/ranger: Grab bars ADL Comments: improving with posterior lean but needs constant cues   Therapy/Group: Individual Therapy  Shayla Heming 10/20/2024, 9:47 AM

## 2024-10-20 NOTE — Progress Notes (Signed)
 PROGRESS NOTE   Subjective/Complaints:  Discussed CBC, BMP Discussed scan results with mom, concerns that Left side was weaker since CVA affecting Left Basal ganglia, we discussed ~10% corticospinal tracts are uncrossed and may affect ipsilateral strength (mainly truncal)   ROS: as per HPI. Denies CP, SOB, abd pain, N/V/D, or any other complaints at this time.   +spasms--improved  Objective:   No results found.  Recent Labs    10/20/24 0631  WBC 6.2  HGB 12.7*  HCT 38.7*  PLT 184    Recent Labs    10/20/24 0631  NA 140  K 4.5  CL 104  CO2 27  GLUCOSE 88  BUN 12  CREATININE 1.13  CALCIUM  8.7*         Intake/Output Summary (Last 24 hours) at 10/20/2024 0713 Last data filed at 10/19/2024 1847 Gross per 24 hour  Intake 473 ml  Output --  Net 473 ml         Physical Exam: Vital Signs Blood pressure 112/74, pulse 76, temperature 97.8 F (36.6 C), resp. rate 16, height 5' 8 (1.727 m), weight 87.8 kg, SpO2 99%.  General: No acute distress.  Laying in bed. Mood and affect are appropriate Heart: Regular rate and rhythm no rubs murmurs or extra sounds Lungs: Clear to auscultation, breathing unlabored, no rales or wheezes Abdomen: Positive bowel sounds, soft nontender to palpation, nondistended Extremities: No clubbing, cyanosis, or edema Skin: No evidence of breakdown, no evidence of rash  MSK: No tenderness to palpation throughout left lower extremity, dorsal plantar foot, or ankle squeeze.  Full passive range of motion.  No crepitus or deformity.  Neuro: Awake, alert.  Follows commands appropriately. moderately dysarthric, difficult to understand  Somewhat Ataxic with arms as well B/L RUE- 4+ to 5-/5 throughout RLE- 4+/5 throughout  LUE- biceps and triceps 3-/5, FF 4/5, FA 4-/5; WE 0 contracted with increased elbow and wrist flexor tone  LLE- HF 3-/5; KE 4-/5; KF 3-/5; DF 2-/5 and PF 2-/5    Likely contracture L wrist, L EF MAS 1+; flexion contractures L toes, MAS 2 L PF, MAS 1 KF  Alien arm, mild left,  Sensation to LT intact   Skin:    General: Skin is warm and dry.  Ecchymosis left navicular and 1st MTP medial aspect   Physical exam unchanged from the above on reexamination 10/20/24     Assessment/Plan: 1. Functional deficits which require 3+ hours per day of interdisciplinary therapy in a comprehensive inpatient rehab setting. Physiatrist is providing close team supervision and 24 hour management of active medical problems listed below. Physiatrist and rehab team continue to assess barriers to discharge/monitor patient progress toward functional and medical goals  Care Tool:  Bathing    Body parts bathed by patient: Left arm, Chest, Abdomen, Front perineal area, Right upper leg, Left upper leg, Face, Right arm, Buttocks   Body parts bathed by helper: Right lower leg, Left lower leg     Bathing assist Assist Level: Minimal Assistance - Patient > 75%     Upper Body Dressing/Undressing Upper body dressing   What is the patient wearing?: Pull over shirt    Upper body  assist Assist Level: Supervision/Verbal cueing    Lower Body Dressing/Undressing Lower body dressing      What is the patient wearing?: Underwear/pull up, Pants     Lower body assist Assist for lower body dressing: Minimal Assistance - Patient > 75%     Toileting Toileting Toileting Activity did not occur (Clothing management and hygiene only): N/A (no void or bm)  Toileting assist Assist for toileting: Moderate Assistance - Patient 50 - 74%     Transfers Chair/bed transfer  Transfers assist     Chair/bed transfer assist level: Moderate Assistance - Patient 50 - 74%     Locomotion Ambulation   Ambulation assist      Assist level: Moderate Assistance - Patient 50 - 74% Assistive device: Hand held assist Max distance: 110   Walk 10 feet activity   Assist     Assist  level: Moderate Assistance - Patient - 50 - 74% Assistive device: Hand held assist   Walk 50 feet activity   Assist    Assist level: Moderate Assistance - Patient - 50 - 74% Assistive device: Hand held assist    Walk 150 feet activity   Assist    Assist level: Moderate Assistance - Patient - 50 - 74% Assistive device: Hand held assist    Walk 10 feet on uneven surface  activity   Assist     Assist level: Moderate Assistance - Patient - 50 - 74% Assistive device: Hand held assist   Wheelchair     Assist Is the patient using a wheelchair?: Yes Type of Wheelchair: Manual    Wheelchair assist level: Dependent - Patient 0% Max wheelchair distance: 150    Wheelchair 50 feet with 2 turns activity    Assist        Assist Level: Dependent - Patient 0%   Wheelchair 150 feet activity     Assist      Assist Level: Dependent - Patient 0%   Blood pressure 112/74, pulse 76, temperature 97.8 F (36.6 C), resp. rate 16, height 5' 8 (1.727 m), weight 87.8 kg, SpO2 99%.  Medical Problem List and Plan: 1. Functional deficits secondary to L inferior basal ganglia stroke (posterior limb of internal capsule)             -patient may  shower             -ELOS/Goals: 12/15 min A to mod I  -Continue CIR   - 12/6: Add LLE PRAFO for contracture prevention, PF tone - has AFO--tolerating well  12-7: Mom concerned about worsening function of left lower extremity; no notable changes on exam, there is some bruising Left foot over navicular area and 1st MTP, ask orthotist to eval for Left medial foot pressure relief  Current AFO is solid, carbon fiber ~44 yrs old   2.  Antithrombotics: -DVT/anticoagulation:  Pharmaceutical: Lovenox  40mg  daily             -antiplatelet therapy: DAPT X 3 weeks followed by Plavix  alone.  3. Pain Management: Tylenol  and flexeril  prn.  4. Mood/Behavior/Sleep: LCSW to follow for evaluation and support             --ativan  6mg  at nights for  sleep/nightmares             -antipsychotic agents: N/A 5. Neuropsych/cognition: This patient is not fully capable of making decisions on his own behalf. 6. Skin/Wound Care: Routine pressure relief measures.  7. Fluids/Electrolytes/Nutrition: Monitor I/O. Routine labs. Continue Klor CR  10mEq BID 8. Panhypopituitarism: On hydrocortisone  5mg  daily, levothyroxine  100mcg daily, and DDAVP  0.2mg  TID-->followed by Dr. Faythe Currently DDAVP  is 0.1mg  QID  9. Low grade fever: resolved CXR and UA neg 10.  HTN: Monitor BP tid--on Losartan  25mg  daily  -  BPs fine, monitor  Vitals:   10/16/24 2011 10/17/24 0600 10/17/24 1347 10/17/24 2147  BP: (!) 142/88 116/68 130/84 138/84   10/18/24 0553 10/18/24 1336 10/18/24 2040 10/19/24 0553  BP: 117/73 131/88 (!) 144/77 109/67   10/19/24 1521 10/19/24 1937 10/20/24 0547 10/20/24 0603  BP: 122/65 123/83 117/71 112/74    11.  Hypernatremia: Resolved Sodium trending back up and DDAVP  increased to 2 mg TID today, Mom prefers intranasal route for pt thinks it controls urination better , will change to home dose and monitor 0.1 QID, (sometimes used .2mg  at noc)       Latest Ref Rng & Units 10/20/2024    6:31 AM 10/16/2024    5:07 AM 10/13/2024    4:58 AM  BMP  Glucose 70 - 99 mg/dL 88  86  888   BUN 6 - 20 mg/dL 12  12  10    Creatinine 0.61 - 1.24 mg/dL 8.86  8.92  9.00   Sodium 135 - 145 mmol/L 140  139  139   Potassium 3.5 - 5.1 mmol/L 4.5  4.0  3.7   Chloride 98 - 111 mmol/L 104  105  103   CO2 22 - 32 mmol/L 27  27  25    Calcium  8.9 - 10.3 mg/dL 8.7  8.6  8.6   Recheck BMP stable cont nasal DDAVP   12. Leucocytosis: WBC of 10.6- Monitor for fevers and other signs of infection  -10/11/24 down to 5.7 today, monitor  13.  H/o Supersella germinoma: Hx of left hemiparesis, cognitive deficits and seizures in the past (no meds)  14. Mild OSA: Unable to tolerate CPAP 15. Mild constipation- pt goes daily at home- might need to intervene if hasn't gone by  tomorrow.  -10/11/24 no BM in 3 days, will give MoM 15ml once. Pt on Senokot S 2 tabs daily.  -10/12/24 LBM yesterday with MoM-- mom wants to leave meds alone though, monitor for further Bms  - LBM 12/5  16. HLD: continue Rosuvastatin  20mg  daily  17.  Spasticity - LUE and LLE  - Left wrist AND toe contracture vs spasticity consider botulinum toxin injection - discussed with pt and his mother, mom is in favor , pt unsure  - 12/6: Add Baclofen  5 mg TID; has PRN flexeril . Bracing as above  - 12/7: Some improvement in spasms, mild improvement in elbow flexor tone.  Continue current regimen.  LOS: 10 days A FACE TO FACE EVALUATION WAS PERFORMED  Prentice FORBES Compton 10/20/2024, 7:13 AM

## 2024-10-21 ENCOUNTER — Ambulatory Visit: Payer: Self-pay | Admitting: Internal Medicine

## 2024-10-21 NOTE — Progress Notes (Signed)
 Physical Therapy Session Note  Patient Details  Name: Nicholas Holt MRN: [id number] Date of Birth: March 04, 1981  Today's Date: 10/21/2024 PT Individual Time: 0911-1007; 1131 - 1202 PT Individual Time Calculation (min): 56 min 31 min   Short Term Goals: Week 2:  PT Short Term Goal 1 (Week 2): STG = LTG  SESSION 1 Skilled Therapeutic Interventions/Progress Updates: Patient sitting in [facility] with family present on entrance to room. Patient alert and agreeable to PT session.   Patient reported no pain during session. Pt's mother stated that pt's dorsal aspect of R foot is swollen (no injuries noted to have occurred since admission) and bruising on R 1st metatarsal. PTA stated that potentially due to some swelling, it is making it tighter in shoe with AFO donned, and with how pt tends to ambulate on forefront of foot vs heel strike, it also might increase pressure/rubbing on 1st metatarsal (pt also noted to have bunion callous in location). PTA discussed with family and pt about having hanger rep come in this week to further assess pt's current gait pattern and AFO (pt had personal AFO for over 3 years). PTA donned L knee high TED hose to decrease swelling.  Therapeutic Activity: Transfers: Pt performed sit<>stand transfers throughout session with minA when adhering to anterior weight shift cue, but also modA due to retropulsion. Pt also required VC to ensure back of knee touches edge of sitting surface. Pt also cued to reach forward with UE to promote centered weight shift (pt also cued to press toes into ground but pt unable to maintain/do so without modA)  Gait Training:  Pt ambulated roughly 95' using no AD with overall modA, and then another round with added cue to kick yoga block on floor to improve step length on L (also with 5lb ankle weight donned). Pt also ambulated in main gym with HW and R LBQC to further assess pt's safety with using AD in R UE. Pt continues to require same cuing listed  below and still required modA due to poor safety awareness and management of AD. Pt demonstrated the following gait deviations with therapist providing the described cuing and facilitation for improvement:  - Pt required max multimodal cuing to increase weight shift to R and to perform heel-strike on L LE - VC required for pt to increase L hip flexion flexion/knee extension (AFO and tight heel-cord potentially adding to difficulty to plant heel on ground during gait cycle but can be improved when pt maintains increased step length) - Multimodal cuing for pt to decrease cadence required with education given for safety as pt presents with decreased reaction time to safety take necessary stepping strategy with L LE if LOB occurs. Pt seemingly understanding but would continue with increasing cadence shortly after each cue.   Pt navigate 4 (6) steps with overall light mod/heavy minA for safety and pt only using R UE on railing. Pt required VC to ensure entire foot is placed on step by touched next base of step with front of shoe. Pt also required multimodal cuing to increase L hip flexion + knee extension when descending (target cue to PTA hand to promote adequate clearance). Pt required seated rest break.  Neuromuscular Re-ed: NMR facilitated during session with focus on dynamic standing balance, coordination. - Pt kicking dodge ball with L LE with overall modA (few times of maxA to prevent posterior LOB) due to pt posterior weight shift and decreased ability to self correct (difficult to determine if pt is aware  of this). Pt cued to keep L LE staggered behind R to increase L hip flexor activation. Overall, pt was not consistent with step length to kick the ball and required manual facilitation to weight shift to R when stepping anteriorly or retro.  NMR performed for improvements in motor control and coordination, balance, sequencing, judgement, and self confidence/ efficacy in performing all aspects of  mobility at highest level of independence.   Patient sitting in WC at end of session with brakes locked, family present, and all needs within reach.  SESSION 2 Skilled Therapeutic Interventions/Progress Updates: Patient handed off from OT session sitting edge of mat in main gym. Patient alert and agreeable to PT session.   Patient reported no pain  Therapeutic Activity: Transfers: Pt performed sit<>stand transfers throughout session with minA and cuing to increase L step clearance and to ensure safe positioning to sit. At end of session pt required use of urinal and voided some on personal lower body clothing. Pt modA to stand while PTA and nsg assisted with changing to new pants and shorts for safety and time management. Pt cued to weight shift accordingly to thread/unthread B LE's from pants/shorts.  Neuromuscular Re-ed: NMR facilitated during session with focus on dynamic sitting balance. - Pt sitting edge of mat with cues to maintain R LE on floor (lifts off ground when pt leans back due to poor trunk control to maintain upright sit). Pt cued to reach forward towards PTA hand in various direction while PTA pulls R LE into hip flexion or knee extension using red theraband with same cue to maintain foot on ground. Mass practice with pt also cued to reach out to table in front when starting to lean back.  NMR performed for improvements in motor control and coordination, balance, sequencing, judgement, and self confidence/ efficacy in performing all aspects of mobility at highest level of independence.   Patient sitting in WC at end of session with brakes locked, nsg and father present and all needs within reach.       Therapy Documentation Precautions:  Precautions Precautions: Fall Recall of Precautions/Restrictions: Impaired Precaution/Restrictions Comments: Left AFO, h/o left hemiplegia Restrictions Weight Bearing Restrictions Per Provider Order: No  Therapy/Group: Individual  Therapy  Xitlally Mooneyham PTA 10/21/2024, 12:52 PM

## 2024-10-21 NOTE — Progress Notes (Signed)
 Physical Therapy Session Note  Patient Details  Name: Nicholas Holt MRN: [id number] Date of Birth: 06-Sep-1981  Today's Date: 10/21/2024 PT Individual Time: 8695-8655 PT Individual Time Calculation (min): 40 min   Short Term Goals: Week 2:  PT Short Term Goal 1 (Week 2): STG = LTG  Skilled Therapeutic Interventions/Progress Updates:     Pt received seated in [facility] and agrees to therapy. No coimpaint of pain. WC transport to gymf or time management. Pt performs sit to stand with minA and cues for weight shifting and sequencing. Pt initially has slight retropulsion but correct with cues. Pt ambulates x175' without AD, requiring minA/modA for trunk stability. Pt tends to shift center of gravity anteriorly and unable to take long enough stride length to maintain balance, requiring manual assistance to correct. Pt also has difficulty with wing through gait pattern with LLE, but is able to complete with consistent cueing. Seated rest break.  Pt completes alternating foot taps on 3 step with minA and cues for lateral weight shifting and sequencing. Following, pt has uncontrolled descent back to sitting, requiring cues for safety and positioning. Seated rest break.   Activity progressed by having pt perform step ups onto 3 step with alternating legs. Pt completes with frequent modA to prevent posterior LOB and posterior bias. Pt is able improve mechanics and sequencing with cues, performing with CGA/minA. Pt completes 2x20 total with seated rest break.   WC transport back to room. Left seated with all needs within reach.   Therapy Documentation Precautions:  Precautions Precautions: Fall Recall of Precautions/Restrictions: Impaired Precaution/Restrictions Comments: Left AFO, h/o left hemiplegia Restrictions Weight Bearing Restrictions Per Provider Order: No   Therapy/Group: Individual Therapy  Elsie JAYSON Dawn, PT, DPT 10/21/2024, 4:11 PM

## 2024-10-21 NOTE — Progress Notes (Signed)
 Occupational Therapy Session Note  Patient Details  Name: Nicholas Holt MRN: [id number] Date of Birth: 22-Dec-1980  Today's Date: 10/21/2024 OT Individual Time: 1030-1130 OT Individual Time Calculation (min): 60 min    Short Term Goals: Week 2:  OT Short Term Goal 1 (Week 2): Pt will be able to hold static stand for 30 seconds with no posterior lean to increase safety and ability to adjust clothing pre and post toileting. OT Short Term Goal 2 (Week 2): Pt will be able to don pants over feet with supervision. OT Short Term Goal 3 (Week 2): pt will complete toilet transfers with LRAD with CGA to close S.  Skilled Therapeutic Interventions/Progress Updates:    Pt received in w/c with father in the room and I suggested to pt that he work on a shower today. Father did not want him to as the bathroom was cold and there was not enough time to heat up the bathroom. His father stated he had already been cleaned up for the day.   Pt agreeable to toileting prior to going to therapy.  STS from w/c only needed CGA with much improved motor memory to sequence steps to stand smoothly.  Ambulated to bathroom with min A but needed cues to pause after every 5-6 steps as he would start going too fast and catching his L foot.  Once at toilet, pt positioned self well and he pulled down pants and urinated standing up.  Only CGA to adjust back of pant waistband once he pulled them up.  Ambulated to sink to wash hands min A and frequent cues to slow down and fully advance L foot.   Sat in wc and then went to tub room with his father to practice tub bench transfers.  They do have a TTB at home but have not been using. Pt practiced those transfers with only CGA/min A.  He was able to get L leg into tub but needed min A getting it out.  But he was wearing heavy AFO and shoe.  Father agreed this would be a much safer transfer then getting into the tub fully.    Pt taken to gym.  Focus of session on sitting balance as  pt continues to lean back.  Used various techniques for forward leaning and reaching and trying to have pt hold upright static position.  Pt does well with visual targets.  Sit to stand from mat only CGA with improved control of balance in standing, but has more difficulty in sitting.  He continues to get distracted easily and needs verbal targets (ie hold for 10 counts, do 8 reps, etc)  Tried having pt sit on wedge to promote neutral pelvis to prevent strong posterior lean. This did help somewhat.    Hand off to PT for next session.    Therapy Documentation Precautions:  Precautions Precautions: Fall Recall of Precautions/Restrictions: Impaired Precaution/Restrictions Comments: Left AFO, h/o left hemiplegia Restrictions Weight Bearing Restrictions Per Provider Order: No  Pain: Pain Assessment Pain Score: 0-No pain ADL: ADL Eating: Set up Grooming: Setup Where Assessed-Grooming: Sitting at sink Upper Body Bathing: Supervision/safety Where Assessed-Upper Body Bathing: Shower Lower Body Bathing: Minimal assistance Where Assessed-Lower Body Bathing: Shower Upper Body Dressing: Supervision/safety Where Assessed-Upper Body Dressing: Sitting at sink Lower Body Dressing: Minimal assistance Where Assessed-Lower Body Dressing: Wheelchair Toileting: Contact guard Where Assessed-Toileting: Teacher, Adult Education: Curator Method: Proofreader: Grab bars Tub/Shower Transfer: Contact guard, Minimal assistance Tub/Shower Transfer Method:  Ambulating Tub/Shower Equipment: Emergency planning/management officer, Acupuncturist: Insurance Underwriter Method: Warden/ranger: Grab bars ADL Comments: improving with posterior lean but needs constant cues   Therapy/Group: Individual Therapy  Kenndra Morris 10/21/2024, 12:56 PM

## 2024-10-21 NOTE — Progress Notes (Signed)
 PROGRESS NOTE   Subjective/Complaints:  DIscussed Left foot , bunion callus bruising, partially related AFO  ROS: as per HPI. Denies CP, SOB, abd pain, N/V/D, or any other complaints at this time.   +spasms--improved  Objective:   No results found.  Recent Labs    10/20/24 0631  WBC 6.2  HGB 12.7*  HCT 38.7*  PLT 184    Recent Labs    10/20/24 0631  NA 140  K 4.5  CL 104  CO2 27  GLUCOSE 88  BUN 12  CREATININE 1.13  CALCIUM  8.7*         Intake/Output Summary (Last 24 hours) at 10/21/2024 0734 Last data filed at 10/20/2024 1850 Gross per 24 hour  Intake 720 ml  Output --  Net 720 ml         Physical Exam: Vital Signs Blood pressure 107/61, pulse (!) 58, temperature (!) 97.5 F (36.4 C), resp. rate 18, height 5' 8 (1.727 m), weight 87.8 kg, SpO2 100%.  General: No acute distress.  Laying in bed. Mood and affect are appropriate Heart: Regular rate and rhythm no rubs murmurs or extra sounds Lungs: Clear to auscultation, breathing unlabored, no rales or wheezes Abdomen: Positive bowel sounds, soft nontender to palpation, nondistended Extremities: No clubbing, cyanosis, or edema Skin: No evidence of breakdown, no evidence of rash  MSK: No tenderness to palpation throughout left lower extremity, dorsal plantar foot, or ankle squeeze.  Full passive range of motion.  No crepitus or deformity.  Neuro: Awake, alert.  Follows commands appropriately. moderately dysarthric, difficult to understand  Somewhat Ataxic with arms as well B/L RUE- 4+ to 5-/5 throughout RLE- 4+/5 throughout  LUE- biceps and triceps 3-/5, FF 4/5, FA 4-/5; WE 0 contracted with increased elbow and wrist flexor tone  LLE- HF 3-/5; KE 4-/5; KF 3-/5; DF 2-/5 and PF 2-/5   Likely contracture L wrist, L EF MAS 1+; flexion contractures L toes, MAS 2 L PF, MAS 1 KF  Alien arm, mild left,  Sensation to LT intact   Skin:     General: Skin is warm and dry.  Ecchymosis left navicular and 1st MTP medial aspect   Physical exam unchanged from the above on reexamination 10/21/24     Assessment/Plan: 1. Functional deficits which require 3+ hours per day of interdisciplinary therapy in a comprehensive inpatient rehab setting. Physiatrist is providing close team supervision and 24 hour management of active medical problems listed below. Physiatrist and rehab team continue to assess barriers to discharge/monitor patient progress toward functional and medical goals  Care Tool:  Bathing    Body parts bathed by patient: Left arm, Chest, Abdomen, Front perineal area, Right upper leg, Left upper leg, Face, Right arm, Buttocks   Body parts bathed by helper: Right lower leg, Left lower leg     Bathing assist Assist Level: Minimal Assistance - Patient > 75%     Upper Body Dressing/Undressing Upper body dressing   What is the patient wearing?: Pull over shirt    Upper body assist Assist Level: Supervision/Verbal cueing    Lower Body Dressing/Undressing Lower body dressing      What is the patient  wearing?: Underwear/pull up, Pants     Lower body assist Assist for lower body dressing: Minimal Assistance - Patient > 75%     Toileting Toileting Toileting Activity did not occur (Clothing management and hygiene only): N/A (no void or bm)  Toileting assist Assist for toileting: Moderate Assistance - Patient 50 - 74%     Transfers Chair/bed transfer  Transfers assist     Chair/bed transfer assist level: Moderate Assistance - Patient 50 - 74%     Locomotion Ambulation   Ambulation assist      Assist level: Moderate Assistance - Patient 50 - 74% Assistive device: No Device Max distance: 75   Walk 10 feet activity   Assist     Assist level: Moderate Assistance - Patient - 50 - 74% Assistive device: No Device   Walk 50 feet activity   Assist    Assist level: Moderate Assistance - Patient  - 50 - 74% Assistive device: No Device    Walk 150 feet activity   Assist    Assist level: Moderate Assistance - Patient - 50 - 74% Assistive device: Hand held assist    Walk 10 feet on uneven surface  activity   Assist     Assist level: Moderate Assistance - Patient - 50 - 74% Assistive device: Hand held assist   Wheelchair     Assist Is the patient using a wheelchair?: Yes Type of Wheelchair: Manual    Wheelchair assist level: Moderate Assistance - Patient 50 - 74% Max wheelchair distance: 150    Wheelchair 50 feet with 2 turns activity    Assist        Assist Level: Moderate Assistance - Patient 50 - 74%   Wheelchair 150 feet activity     Assist      Assist Level: Maximal Assistance - Patient 25 - 49%   Blood pressure 107/61, pulse (!) 58, temperature (!) 97.5 F (36.4 C), resp. rate 18, height 5' 8 (1.727 m), weight 87.8 kg, SpO2 100%.  Medical Problem List and Plan: 1. Functional deficits secondary to L inferior basal ganglia stroke (posterior limb of internal capsule)             -patient may  shower             -ELOS/Goals: 12/15 min A to mod I  -Continue CIR   - 12/6: Add LLE PRAFO for contracture prevention, PF tone - has AFO--tolerating well  12-7: Mom concerned about worsening function of left lower extremity; no notable changes on exam, there is some bruising Left foot over navicular area and 1st MTP, ask orthotist to eval for Left medial foot pressure relief  Current AFO is solid, carbon fiber ~44 yrs old  Orthotics consult written   2.  Antithrombotics: -DVT/anticoagulation:  Pharmaceutical: Lovenox  40mg  daily             -antiplatelet therapy: DAPT X 3 weeks followed by Plavix  alone.  3. Pain Management: Tylenol  and flexeril  prn.  4. Mood/Behavior/Sleep: LCSW to follow for evaluation and support             --ativan  6mg  at nights for sleep/nightmares             -antipsychotic agents: N/A 5. Neuropsych/cognition: This  patient is not fully capable of making decisions on his own behalf. 6. Skin/Wound Care: Routine pressure relief measures.  7. Fluids/Electrolytes/Nutrition: Monitor I/O. Routine labs. Continue Klor CR 10mEq BID 8. Panhypopituitarism: On hydrocortisone  5mg  daily, levothyroxine  100mcg daily,  and DDAVP  0.2mg  TID-->followed by Dr. Faythe Currently DDAVP  is 0.1mg  QID  9. Low grade fever: resolved CXR and UA neg 10.  HTN: Monitor BP tid--on Losartan  25mg  daily  -  BPs fine, monitor  Vitals:   10/17/24 2147 10/18/24 0553 10/18/24 1336 10/18/24 2040  BP: 138/84 117/73 131/88 (!) 144/77   10/19/24 0553 10/19/24 1521 10/19/24 1937 10/20/24 0547  BP: 109/67 122/65 123/83 117/71   10/20/24 0603 10/20/24 1337 10/20/24 1948 10/21/24 0600  BP: 112/74 128/75 137/76 107/61    11.  Hypernatremia: Resolved Sodium trending back up and DDAVP  increased to 2 mg TID today, Mom prefers intranasal route for pt thinks it controls urination better , will change to home dose and monitor 0.1 QID, (sometimes used .2mg  at noc)       Latest Ref Rng & Units 10/20/2024    6:31 AM 10/16/2024    5:07 AM 10/13/2024    4:58 AM  BMP  Glucose 70 - 99 mg/dL 88  86  888   BUN 6 - 20 mg/dL 12  12  10    Creatinine 0.61 - 1.24 mg/dL 8.86  8.92  9.00   Sodium 135 - 145 mmol/L 140  139  139   Potassium 3.5 - 5.1 mmol/L 4.5  4.0  3.7   Chloride 98 - 111 mmol/L 104  105  103   CO2 22 - 32 mmol/L 27  27  25    Calcium  8.9 - 10.3 mg/dL 8.7  8.6  8.6   Recheck BMP stable cont nasal DDAVP   12. Leucocytosis: resolved   13.  H/o Supersella germinoma: Hx of left hemiparesis, cognitive deficits and seizures in the past (no meds)  14. Mild OSA: Unable to tolerate CPAP 15. Mild constipation- pt goes daily at home- might need to intervene if hasn't gone by tomorrow.  -10/11/24 no BM in 3 days, will give MoM 15ml once. Pt on Senokot S 2 tabs daily.  -10/12/24 LBM yesterday with MoM-- mom wants to leave meds alone though, monitor for further  Bms  - LBM 12/5  16. HLD: continue Rosuvastatin  20mg  daily  17.  Spasticity - LUE and LLE  - Left wrist AND toe contracture vs spasticity consider botulinum toxin injection - discussed with pt and his mother, mom is in favor , pt unsure  - 12/6: Add Baclofen  5 mg TID; has PRN flexeril . Bracing as above  - 12/7: Some improvement in spasms, mild improvement in elbow flexor tone.  Continue current regimen. Consider Botulinum toxin  LOS: 11 days A FACE TO FACE EVALUATION WAS PERFORMED  Prentice FORBES Compton 10/21/2024, 7:34 AM

## 2024-10-21 NOTE — Plan of Care (Signed)
  Problem: RH Balance Goal: LTG Patient will maintain dynamic standing with ADLs (OT) Description: LTG:  Patient will maintain dynamic standing balance with assist during activities of daily living (OT)  Flowsheets (Taken 10/21/2024 1258) LTG: Pt will maintain dynamic standing balance during ADLs with: (LTG upgraded due to pt's progress.) Contact Guard/Touching assist Note: LTG upgraded due to pt's progress.    Problem: Sit to Stand Goal: LTG:  Patient will perform sit to stand in prep for activites of daily living with assistance level (OT) Description: LTG:  Patient will perform sit to stand in prep for activites of daily living with assistance level (OT) Flowsheets (Taken 10/21/2024 1258) LTG: PT will perform sit to stand in prep for activites of daily living with assistance level: (LTG upgraded due to pt's progress.) Contact Guard/Touching assist Note: LTG upgraded due to pt's progress.    Problem: RH Bathing Goal: LTG Patient will bathe all body parts with assist levels (OT) Description: LTG: Patient will bathe all body parts with assist levels (OT) Flowsheets (Taken 10/21/2024 1258) LTG: Pt will perform bathing with assistance level/cueing: (LTG upgraded due to pt's progress.) Contact Guard/Touching assist Note: LTG upgraded due to pt's progress.    Problem: RH Dressing Goal: LTG Patient will perform lower body dressing w/assist (OT) Description: LTG: Patient will perform lower body dressing with assist, with/without cues in positioning using equipment (OT) Flowsheets (Taken 10/21/2024 1258) LTG: Pt will perform lower body dressing with assistance level of: (LTG upgraded due to pt's progress. (for underwear and pants only)) Contact Guard/Touching assist Note: LTG upgraded due to pt's progress. (for underwear and pants only)    Problem: RH Toileting Goal: LTG Patient will perform toileting task (3/3 steps) with assistance level (OT) Description: LTG: Patient will perform toileting task  (3/3 steps) with assistance level (OT)  Flowsheets (Taken 10/21/2024 1258) LTG: Pt will perform toileting task (3/3 steps) with assistance level: (LTG upgraded due to pt's progress.) Contact Guard/Touching assist Note: LTG upgraded due to pt's progress.    Problem: RH Toilet Transfers Goal: LTG Patient will perform toilet transfers w/assist (OT) Description: LTG: Patient will perform toilet transfers with assist, with/without cues using equipment (OT) Flowsheets (Taken 10/21/2024 1258) LTG: Pt will perform toilet transfers with assistance level of: (LTG upgraded due to pt's progress.) Contact Guard/Touching assist Note: LTG upgraded due to pt's progress.    Problem: RH Tub/Shower Transfers Goal: LTG Patient will perform tub/shower transfers w/assist (OT) Description: LTG: Patient will perform tub/shower transfers with assist, with/without cues using equipment (OT) Flowsheets (Taken 10/21/2024 1258) LTG: Pt will perform tub/shower stall transfers with assistance level of: (LTG upgraded due to pt's progress.) Contact Guard/Touching assist LTG: Pt will perform tub/shower transfers from: Tub/shower combination Note: LTG upgraded due to pt's progress.

## 2024-10-21 NOTE — Telephone Encounter (Signed)
 Medication sent to pharmacy

## 2024-10-22 ENCOUNTER — Ambulatory Visit: Admitting: Neurology

## 2024-10-22 NOTE — Progress Notes (Signed)
 Occupational Therapy Note  Patient Details  Name: SAVYON LOKEN MRN: [id number] Date of Birth: Jul 02, 1981  Occupational Therapist participated in the interdisciplinary team conference, providing clinical information regarding the patients current status, treatment goals, and weekly focus, including any barriers that need to be addressed. Please see the Inpatient Rehabilitation Team Conference and Plan of Care Update for further details.       Aoki Wedemeyer 10/22/2024, 10:23 AM

## 2024-10-22 NOTE — Patient Care Conference (Incomplete)
 Inpatient RehabilitationTeam Conference and Plan of Care Update Date: 10/22/2024   Time: 4:23 AM    Patient Name: Nicholas Holt      Medical Record Number: [id number]  Date of Birth: 02-Sep-1981 Sex: Male         Room/Bed: 4M05C/4M05C-01 Payor Info: Payor: [employer]ICARE / Plan: DREMA DUAL COMPLETE / Product Type: *No Product type* /    Admit Date/Time:  10/10/2024  3:39 PM  Primary Diagnosis:  Left basal ganglia embolic stroke [facility])  Hospital Problems: Principal Problem:   Left basal ganglia embolic stroke [facility]) Active Problems:   Small vessel stroke [facility])    Expected Discharge Date: Expected Discharge Date: 10/27/24  Team Members Present:       Current Status/Progress Goal Weekly Team Focus  Bowel/Bladder   Pt is continent of Bowel and bladder (LBM 12/9)  *** Pt will remain continent of Bowel and Bladder   Follow Bowel and bladder program  offer tolieting Q 4 hrs while awake    Swallow/Nutrition/ Hydration      ***         ADL's   supervision UB self care, min A LB bathing, toileting and donning pants/underwear, min A ambulation to bathroom  *** min A goals set initially, but pt has met most of his goals so will upgrade LTGs to CGA toilet and shower transfers, LB bathing and toileting.   Postural control, sit to stands, balance, ADL training, pt/fam education, attention    Mobility      ***         Communication   severe dysarthria  *** minA @ 90%   SLOP speech strategies - mainly increase vocal intensity and SLOW rate    Safety/Cognition/ Behavioral Observations  severe cognitive deficits  *** modA   orientation, basic problem solving, memory    Pain   pt denies pain  *** Pt will remain pain free   Pt will be assessed for pain Q shift    Skin   Pt skin is intact with no alterations in skin integrity  *** Pt skin will remain clean dry and intact  Assess Qshift      Discharge Planning:      Team  Discussion: *** Patient on target to meet rehab goals: {IP REHAB YES/NO WITH TPOIRJMID:75863}  *See Care Plan and progress notes for long and short-term goals.   Revisions to Treatment Plan:  ***  Teaching Needs: ***  Current Barriers to Discharge: {BARRIERS TO [id number]}  Possible Resolutions to Barriers: ***     Medical Summary               I attest that I was present, lead the team conference, and concur with the assessment and plan of the team.   Randine JONETTA Stevens GLENWOOD Con 10/22/2024, 4:23 AM

## 2024-10-22 NOTE — Patient Care Conference (Signed)
 Inpatient RehabilitationTeam Conference and Plan of Care Update Date: 10/22/2024   Time: 10:17 AM    Patient Name: Nicholas Holt      Medical Record Number: [id number]  Date of Birth: 07-Apr-1981 Sex: Male         Room/Bed: 4M05C/4M05C-01 Payor Info: Payor: [employer] MEDICARE / Plan: DREMA DUAL COMPLETE / Product Type: *No Product type* /    Admit Date/Time:  10/10/2024  3:39 PM  Primary Diagnosis:  Left basal ganglia embolic stroke [facility])  Hospital Problems: Principal Problem:   Left basal ganglia embolic stroke [facility]) Active Problems:   Small vessel stroke [facility])    Expected Discharge Date: Expected Discharge Date: 10/31/24 (LOS extended)  Team Members Present: Physician leading conference: Dr. Prentice Compton Social Worker Present: Rhoda Clement, LCSW Nurse Present: Barnie Ronde, RN PT Present: Delinda Bertrand, PTA;Sherlean Perks, PT OT Present: Julia Saguier, OT SLP Present: Cassidi Sockwell, SLP     Current Status/Progress Goal Weekly Team Focus  Bowel/Bladder   Pt is continent of Bowel and bladder (LBM 12/9)   Pt will remain continent of Bowel and Bladder   Follow Bowel and bladder program  offer tolieting Q 4 hrs while awake    Swallow/Nutrition/ Hydration               ADL's   supervision UB self care, min A LB bathing, toileting and donning pants/underwear, min A ambulation to bathroom   min A goals set initially, but pt has met most of his goals so will upgrade LTGs to CGA toilet and shower transfers, LB bathing and toileting.   Postural control, sit to stands, balance, ADL training, pt/fam education, attention    Mobility   bed mobility = CGA; transfers = min/light modA; ambulation = modA no AD (not consistent with L step clearance/length and correcting standing balance - forward fall risk due to speeding up)   minA overall  Barriers = poor carry over with cuing; Focus = NMRE, weight shift, gait, bed mobility, transfer, family ed,  repetition of movement to increase motor response    Communication   severe dysarthria   minA @ 90%   SLOP speech strategies - mainly increase vocal intensity and SLOW rate    Safety/Cognition/ Behavioral Observations  severe cognitive deficits   modA   orientation, basic problem solving, memory    Pain   pt denies pain   Pt will remain pain free   Pt will be assessed for pain Q shift    Skin   Pt skin is intact with no alterations in skin integrity   Pt skin will remain clean dry and intact  Pt will be turned and repositioned Q 2hrs for positioning, comfort and skin integrity      Discharge Planning:  Parents concerned if will be ready to go home at DC date, they can only provide limited assist and want to make sure can manage at the level he wil be at DC. Both parents here daily and observing in therapies.   Team Discussion: Patient admitted post left basal ganglia CVA with history of germinoma, multi infarcts and chronic left hemiparesis; using a left AFO on LE with spasticity/wrist contracture and DDAVP . Father endorses STMD and orientation deficits have worsened with new CVA and note dysarthria with low vocal intensity.  Little right sided symptoms with truncal weakness, poor memory and small attention span with cognitive dysarthria and balance issues.  Patient on target to meet rehab goals: yes, currently needs min -  CGA for self care. Transfers using a tub bench with CGA. Needs min assist for ambulation and mod - max assist for standing and mod assist for steps. Needs visual and verbal targets to maintain balance. Continue to work on orientation, basic problem solving and communication.  *See Care Plan and progress notes for long and short-term goals.   Revisions to Treatment Plan:  AFO consult with toe cap Botox injection OP for left wrist contracture Changed DDAVP  to nasal spray form   Teaching Needs: Safety, medications, dietary modification, transfers,  toileting, etc.   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 Possible Resolutions to [employer]: work on core strength, upgrading goals to [facility] for transfers still having problems with standing balance   Continued Need for Acute Rehabilitation Level of Care: The patient requires daily medical management by a physician with specialized training in physical medicine and rehabilitation for the following reasons: Direction of a multidisciplinary physical rehabilitation program to maximize functional independence : Yes Medical management of patient stability for increased activity during participation in an intensive rehabilitation regime.: Yes Analysis of laboratory values and/or radiology reports with any subsequent need for medication adjustment and/or medical intervention. : Yes   I attest that I was present, lead the team conference, and concur with the assessment and plan of the team.   Fredericka Barnie NOVAK 10/22/2024, 3:24 PM

## 2024-10-22 NOTE — Progress Notes (Signed)
 Occupational Therapy Session Note  Patient Details  Name: Nicholas Holt MRN: [id number] Date of Birth: [birth year]  Today's Date: 10/22/2024 OT Individual Time: 9081-9040 and [id number]  OT Individual Time Calculation (min): 41 min and 20 min (missed 10 min due to schedule delay)   Short Term Goals: Week 2:  OT Short Term Goal 1 (Week 2): Pt will be able to hold static stand for 30 seconds with no posterior lean to increase safety and ability to adjust clothing pre and post toileting. OT Short Term Goal 2 (Week 2): Pt will be able to don pants over feet with supervision. OT Short Term Goal 3 (Week 2): pt will complete toilet transfers with LRAD with CGA to close S.  Skilled Therapeutic Interventions/Progress Updates:    Visit 1:  Pain: no c/o pain  Pt received in w/c and ready for therapy.  Pt taken to gym to work on standing balance in parallel bars:   -sit to stands from w/c with pushing up from arm rest and then resting R hand on bar -squats -standing and reaching with R hand to weight shift forward with only L CGA -stepping R foot forward and back and then L foot forward using R hand on bar for light support. Pt able to step left foot forward but then just brings foot into midline, as he has difficulty stepping foot back behind right foot.  Pt then moved to mat: -stand pivot to mat CGA -sitting balance with reaching forward to a target.   Pt able to sit upright and flex forward but has very short attention span to hold sit unless he is engaged on a task - continues to lift R foot so used visual target cues to keep foot planted as he was sitting.  -stand pivot back to w/c  Visit 2: Pain : no c/o pain  Pt received in room and taken to gym. -stand pivot to mat CGA but had pt stay in standing -standing balance with reaching forward and practice with reaching B hands from knees to hips (to simulate pulling up his pants)  -standing side to side taps to dance with music -pt able  to hold balance with no posterior lean with CGA  Sitting balance at EOM.  With pt engaged in active reaching forward (had pt do a punching activity reaching in all directions/planes of movement) to my hands as a target, pt sat upright and forward with NO leaning back for over 5 minutes as he was engaged in the activity.    Had planned to have pt continue therapy session until 1215 but pt stated he was hungry and really wanted to go get lunch.  He stepped to wc with only light CGA and returned to room with his parents present.    Therapy Documentation Precautions:  Precautions Precautions: Fall Recall of Precautions/Restrictions: Impaired Precaution/Restrictions Comments: Left AFO, h/o left hemiplegia Restrictions Weight Bearing Restrictions Per Provider Order: No ADL: ADL Eating: Set up Grooming: Setup Where Assessed-Grooming: Sitting at sink Upper Body Bathing: Supervision/safety Where Assessed-Upper Body Bathing: Shower Lower Body Bathing: Minimal assistance Where Assessed-Lower Body Bathing: Shower Upper Body Dressing: Supervision/safety Where Assessed-Upper Body Dressing: Sitting at sink Lower Body Dressing: Minimal assistance Where Assessed-Lower Body Dressing: Wheelchair Toileting: Contact guard Where Assessed-Toileting: Teacher, Adult Education: Curator Method: Proofreader: Engineer, Technical Sales: Contact guard, Minimal assistance Tub/Shower Transfer Method: Ship Broker: Emergency planning/management officer, Acupuncturist: Insurance Underwriter  Method: Stand pivot Astronomer: Grab bars ADL Comments: improving with posterior lean but needs constant cues    Therapy/Group: Individual Therapy  Aemilia Dedrick 10/22/2024, 10:04 AM

## 2024-10-22 NOTE — Progress Notes (Signed)
 PROGRESS NOTE   Subjective/Complaints:  No issues overnite ,per mom, and pt orthotist has not evaled L AFO for modifications yet  ROS: as per HPI. Denies CP, SOB, abd pain, N/V/D, or any other complaints at this time.   +spasms--improved  Objective:   No results found.  Recent Labs    10/20/24 0631  WBC 6.2  HGB 12.7*  HCT 38.7*  PLT 184    Recent Labs    10/20/24 0631  NA 140  K 4.5  CL 104  CO2 27  GLUCOSE 88  BUN 12  CREATININE 1.13  CALCIUM  8.7*         Intake/Output Summary (Last 24 hours) at 10/22/2024 9360 Last data filed at 10/21/2024 1858 Gross per 24 hour  Intake 720 ml  Output --  Net 720 ml         Physical Exam: Vital Signs Blood pressure 106/69, pulse 77, temperature 98.1 F (36.7 C), resp. rate 18, height 5' 8 (1.727 m), weight 87.8 kg, SpO2 90%.  General: No acute distress.  Laying in bed. Mood and affect are appropriate Heart: Regular rate and rhythm no rubs murmurs or extra sounds Lungs: Clear to auscultation, breathing unlabored, no rales or wheezes Abdomen: Positive bowel sounds, soft nontender to palpation, nondistended Extremities: No clubbing, cyanosis, or edema Skin: No evidence of breakdown, no evidence of rash  MSK: No tenderness to palpation throughout left lower extremity, dorsal plantar foot, or ankle squeeze.  Full passive range of motion.  No crepitus or deformity.  Neuro: Awake, alert.  Follows commands appropriately. moderately dysarthric, difficult to understand  Somewhat Ataxic with arms as well B/L RUE- 5/5 throughout RLE- 5/5 throughout  LUE- biceps and triceps 3-/5, FF 4/5, FA 4-/5; WE 0 contracted with increased elbow and wrist flexor tone  LLE- HF 3-/5; KE 4-/5; KF 3-/5; DF 2-/5 and PF 2-/5   Likely contracture L wrist, L EF MAS 1+; flexion contractures L toes, MAS 2 L PF, MAS 1 KF  Sensation to LT intact   Skin:    General: Skin is warm and  dry.  Ecchymosis left navicular and 1st MTP medial aspect   Physical exam unchanged from the above on reexamination 10/22/24     Assessment/Plan: 1. Functional deficits which require 3+ hours per day of interdisciplinary therapy in a comprehensive inpatient rehab setting. Physiatrist is providing close team supervision and 24 hour management of active medical problems listed below. Physiatrist and rehab team continue to assess barriers to discharge/monitor patient progress toward functional and medical goals  Care Tool:  Bathing    Body parts bathed by patient: Left arm, Chest, Abdomen, Front perineal area, Right upper leg, Left upper leg, Face, Right arm, Buttocks   Body parts bathed by helper: Right lower leg, Left lower leg     Bathing assist Assist Level: Minimal Assistance - Patient > 75%     Upper Body Dressing/Undressing Upper body dressing   What is the patient wearing?: Pull over shirt    Upper body assist Assist Level: Supervision/Verbal cueing    Lower Body Dressing/Undressing Lower body dressing      What is the patient wearing?: Underwear/pull up,  Pants     Lower body assist Assist for lower body dressing: Minimal Assistance - Patient > 75%     Toileting Toileting Toileting Activity did not occur (Clothing management and hygiene only): N/A (no void or bm)  Toileting assist Assist for toileting: Contact Guard/Touching assist     Transfers Chair/bed transfer  Transfers assist     Chair/bed transfer assist level: Minimal Assistance - Patient > 75%     Locomotion Ambulation   Ambulation assist      Assist level: Moderate Assistance - Patient 50 - 74% Assistive device: No Device Max distance: 75   Walk 10 feet activity   Assist     Assist level: Moderate Assistance - Patient - 50 - 74% Assistive device: No Device   Walk 50 feet activity   Assist    Assist level: Moderate Assistance - Patient - 50 - 74% Assistive device: No  Device    Walk 150 feet activity   Assist    Assist level: Moderate Assistance - Patient - 50 - 74% Assistive device: Hand held assist    Walk 10 feet on uneven surface  activity   Assist     Assist level: Moderate Assistance - Patient - 50 - 74% Assistive device: Hand held assist   Wheelchair     Assist Is the patient using a wheelchair?: Yes Type of Wheelchair: Manual    Wheelchair assist level: Moderate Assistance - Patient 50 - 74% Max wheelchair distance: 150    Wheelchair 50 feet with 2 turns activity    Assist        Assist Level: Moderate Assistance - Patient 50 - 74%   Wheelchair 150 feet activity     Assist      Assist Level: Maximal Assistance - Patient 25 - 49%   Blood pressure 106/69, pulse 77, temperature 98.1 F (36.7 C), resp. rate 18, height 5' 8 (1.727 m), weight 87.8 kg, SpO2 90%.  Medical Problem List and Plan: 1. Functional deficits secondary to L inferior basal ganglia stroke (posterior limb of internal capsule)             -patient may  shower             -ELOS/Goals: 12/15 min A to mod I  -Continue CIR   - 12/6: Add LLE PRAFO for contracture prevention, PF tone - has AFO--tolerating well  12-7: Mom concerned about worsening function of left lower extremity; no notable changes on exam, there is some bruising Left foot over navicular area and 1st MTP, ask orthotist to eval for Left medial foot pressure relief  Current AFO is solid, carbon fiber ~44 yrs old  Orthotics consult written   2.  Antithrombotics: -DVT/anticoagulation:  Pharmaceutical: Lovenox  40mg  daily             -antiplatelet therapy: DAPT X 3 weeks followed by Plavix  alone.  3. Pain Management: Tylenol  and flexeril  prn.  4. Mood/Behavior/Sleep: LCSW to follow for evaluation and support             --ativan  6mg  at nights for sleep/nightmares             -antipsychotic agents: N/A 5. Neuropsych/cognition: This patient is not fully capable of making decisions  on his own behalf. 6. Skin/Wound Care: Routine pressure relief measures.  7. Fluids/Electrolytes/Nutrition: Monitor I/O. Routine labs. Continue Klor CR 10mEq BID 8. Panhypopituitarism: On hydrocortisone  5mg  daily, levothyroxine  100mcg daily, and DDAVP  0.2mg  TID-->followed by Dr. Faythe Currently DDAVP  is  0.1mg  QID  9. Low grade fever: resolved CXR and UA neg 10.  HTN: Monitor BP tid--on Losartan  25mg  daily  -  BPs fine, monitor  Vitals:   10/18/24 2040 10/19/24 0553 10/19/24 1521 10/19/24 1937  BP: (!) 144/77 109/67 122/65 123/83   10/20/24 0547 10/20/24 0603 10/20/24 1337 10/20/24 1948  BP: 117/71 112/74 128/75 137/76   10/21/24 0600 10/21/24 1249 10/21/24 2025 10/22/24 0535  BP: 107/61 129/82 118/69 106/69    11.  Hypernatremia: Resolved Sodium trending back up and DDAVP  increased to 2 mg TID today, Mom prefers intranasal route for pt thinks it controls urination better , will change to home dose and monitor 0.1 QID, (sometimes used .2mg  at noc)       Latest Ref Rng & Units 10/20/2024    6:31 AM 10/16/2024    5:07 AM 10/13/2024    4:58 AM  BMP  Glucose 70 - 99 mg/dL 88  86  888   BUN 6 - 20 mg/dL 12  12  10    Creatinine 0.61 - 1.24 mg/dL 8.86  8.92  9.00   Sodium 135 - 145 mmol/L 140  139  139   Potassium 3.5 - 5.1 mmol/L 4.5  4.0  3.7   Chloride 98 - 111 mmol/L 104  105  103   CO2 22 - 32 mmol/L 27  27  25    Calcium  8.9 - 10.3 mg/dL 8.7  8.6  8.6   Recheck BMP stable cont nasal DDAVP   12. Leucocytosis: resolved   13.  H/o Supersella germinoma: Hx of left hemiparesis, cognitive deficits and seizures in the past (no meds)  14. Mild OSA: Unable to tolerate CPAP 15. Mild constipation- pt goes daily at home- might need to intervene if hasn't gone by tomorrow.    - LBM 12/9- continent  16. HLD: continue Rosuvastatin  20mg  daily  17.  Spasticity - LUE and LLE  - Left wrist AND toe contracture vs spasticity consider botulinum toxin injection - discussed with pt and his mother, mom  is in favor , pt unsure  - 12/6: Add Baclofen  5 mg TID; has PRN flexeril . Bracing as above  - 12/7: Some improvement in spasms, mild improvement in elbow flexor tone.  Continue current regimen. Consider Botulinum toxin as outpatient  LOS: 12 days A FACE TO FACE EVALUATION WAS PERFORMED  Prentice FORBES Compton 10/22/2024, 6:39 AM

## 2024-10-22 NOTE — Progress Notes (Signed)
 Patient ID: Nicholas Holt, male   DOB: 1980/12/26, 43 y.o.   MRN: [id number]  Met with parents to give update regarding team conference progress this week in therapies and plan to extend to 12/19, needs to make a certain level to be able to be managed by his parents. Parents are happy for the extension and are in agreement with home health and looking into any programs for pt to go to a few hours per day. Will await any equipment needs and work on discharge needs.

## 2024-10-22 NOTE — Progress Notes (Signed)
 Speech Language Pathology Daily Session Note  Patient Details  Name: Nicholas Holt MRN: [id number] Date of Birth: 1980/12/06  Today's Date: 10/22/2024 SLP Individual Time: 1400-1458 SLP Individual Time Calculation (min): 58 min  Short Term Goals: Week 2: SLP Short Term Goal 1 (Week 2): STG = LTG due to ELOS  Skilled Therapeutic Interventions: Skilled therapy session focused on communication and cognitive goals. SLP facilitated session by reviewing speech intelligibility strategies including SLOP (slow, loud, over articulate, pause) and pacing board. Patient required mod fading to minA to utilize strategies at the phrase level to reach 85% intelligibility. Patient also requested a drink using pacing board this session! SLP targeted cognitive goals through prompting patient to sequence months, then match them to holidays. Patient sequenced months independently and matched holidays with 50% accuracy. After holidays were matched, patient recalled information about each holiday with minA. Patient oriented to self and location this date, however required external aid to orient to time. Patient left in  Pain denies  Therapy/Group: Individual Therapy  Darrly Loberg M.A., CCC-SLP 10/22/2024, 7:49 AM

## 2024-10-22 NOTE — Progress Notes (Signed)
 Physical Therapy Session Note  Patient Details  Name: Nicholas Holt MRN: [id number] Date of Birth: Sep 20, 1981  Today's Date: 10/22/2024 PT Individual Time: 9165-9084; 1035 - 1100 PT Individual Time Calculation (min): 41 min; 25 min   Short Term Goals: Week 2:  PT Short Term Goal 1 (Week 2): STG = LTG  SESSION 1 Skilled Therapeutic Interventions/Progress Updates: Patient sitting in recliner with family present on entrance to room. Patient alert and agreeable to PT session.   Patient reported no pain. Beginning of session pt parents expressed concern about upcoming d/c date currently set (12/15), and that they do not feel that pt is ready to go home safely due to decreased standing/sitting balance and inability to ambulate safely without modA and poor safety awareness/carryover. PTA stated this will be discussed during team conference, and that PTA and attending OT have been discussing this as well.   Therapeutic Activity: Bed Mobility: Pt performed supine<>sit on EOB with CGA and cue to avoid use of bed features. Pt also cued to maintain R LE on floor when getting to edge of bed as it tends to extend at knee and flex at hip when pt starts to lean back. Pt also required increased cuing to avoid scooting too close to edge of mat/bed. Transfers: Pt performed sit<>stand transfers throughout session with min/modA. Pt inconsistent with standing due to retropulsion and poor carryover to recall knows over toes. Pt also with frequent weight shift onto heels with decreased awareness to self correct (hinted cue as to how pt can fix back to center).   Pt ambulated in main gym briefly but was ultimately transported to central hallway due to main gym being moderately distracting. Pt ambulated with no UE support and continues to require same max multimodal cuing to shift weight to R, increase L step length/clearance  and to decrease cadence to avoid anterior fall. Pt required modA  Patient sitting in WC  at end of session with brakes locked, family present, and all needs within reach.  SESSION 2 Skilled Therapeutic Interventions/Progress Updates: Patient sitting in [facility] with family present on entrance to room. Patient alert and agreeable to PT session.   Patient reported no pain. PTA updated pt and pt family on extended d/c date to 12/19. Pt mother concerned about pt's walking ability at d/c. PTA discussed that PT sessions will continue to focus on ambulation, and to keep in mind of the potential need for a WC for household/community distances.   Pt with L posterior leaf AFO donned L LE vs personal AFO to assess difference in gait pattern. Pt also with shoe cap donned on L. Pt ambulated in main gym and required max multimodal cuing to decrease cadence, and to increase L step length/clearance. Pt made little to no adjustments to decrease cadence to prevent anterior fall. Pt performed step to 6 step with 6lb ankle weight donned L LE and overall minA to step to, but at least modA to prevent posterior LOB due to frequent posterior weight shift to B heels. Pt required seated rest break. Pt transported back to room from main gym in [facility].  Patient sitting in WC at end of session with brakes locked, family present, and all needs within reach.       Therapy Documentation Precautions:  Precautions Precautions: Fall Recall of Precautions/Restrictions: Impaired Precaution/Restrictions Comments: Left AFO, h/o left hemiplegia Restrictions Weight Bearing Restrictions Per Provider Order: No  Therapy/Group: Individual Therapy  Zuri Lascala PTA 10/22/2024, 12:22 PM

## 2024-10-22 NOTE — Plan of Care (Signed)
  Problem: Consults Goal: RH STROKE PATIENT EDUCATION Description: See Patient Education module for education specifics  Outcome: Progressing   Problem: RH BOWEL ELIMINATION Goal: RH STG MANAGE BOWEL WITH ASSISTANCE Description: STG Manage Bowel with mod I Assistance. Outcome: Progressing Goal: RH STG MANAGE BOWEL W/MEDICATION W/ASSISTANCE Description: STG Manage Bowel with Medication with mod I Assistance. Outcome: Progressing   Problem: RH SAFETY Goal: RH STG ADHERE TO SAFETY PRECAUTIONS W/ASSISTANCE/DEVICE Description: STG Adhere to Safety Precautions With cues Assistance/Device. Outcome: Progressing   Problem: RH COGNITION-NURSING Goal: RH STG USES MEMORY AIDS/STRATEGIES W/ASSIST TO PROBLEM SOLVE Description: STG Uses Memory Aids/Strategies With cues Assistance to Problem Solve. Outcome: Progressing   Problem: RH KNOWLEDGE DEFICIT Goal: RH STG INCREASE KNOWLEDGE OF HYPERTENSION Description: Patient and parents will be able to manage HTN using educational resources for medications and dietary modification independently Outcome: Progressing Goal: RH STG INCREASE KNOWLEGDE OF HYPERLIPIDEMIA Description: Patient and parents will be able to manage HLD using educational resources for medications and dietary modification independently Outcome: Progressing Goal: RH STG INCREASE KNOWLEDGE OF STROKE PROPHYLAXIS Description: Patient and parents will be able to manage secondary risks using educational resources for medications and dietary modification independently Outcome: Progressing

## 2024-10-22 NOTE — Progress Notes (Addendum)
 Physical Therapy Note  Patient Details  Name: CREWE HEATHMAN MRN: [id number] Date of Birth: 1981/01/17 Today's Date: 10/22/2024    Physical Therapist Assistant participated in the interdisciplinary team conference, providing clinical information regarding the patients current status, treatment goals, and weekly focus, including any barriers that need to be addressed. PT present during conference. Please see the Inpatient Rehabilitation Team Conference and Plan of Care Update for further details.    Shilo Philipson PTA 10/22/2024, 3:26 PM

## 2024-10-23 LAB — BASIC METABOLIC PANEL WITH GFR
Anion gap: 6 (ref 5–15)
BUN: 14 mg/dL (ref 6–20)
CO2: 30 mmol/L (ref 22–32)
Calcium: 8.8 mg/dL — ABNORMAL LOW (ref 8.9–10.3)
Chloride: 103 mmol/L (ref 98–111)
Creatinine, Ser: 1.09 mg/dL (ref 0.61–1.24)
GFR, Estimated: >60 mL/min (ref >60)
Glucose, Bld: 81 mg/dL (ref 70–99)
Potassium: 4 mmol/L (ref 3.5–5.1)
Sodium: 139 mmol/L (ref 135–145)

## 2024-10-23 NOTE — Progress Notes (Signed)
 PROGRESS NOTE   Subjective/Complaints:  Appreciate labwork, shared results with pt and parents  Mom is concerned about his care needs, she and her husband have MSK limitations, discussed that recovery from CVA can take months and may only be partial   ROS: as per HPI. Denies CP, SOB, abd pain, N/V/D, or any other complaints at this time.   +spasms--improved  Objective:   No results found.  No results for input(s): WBC, HGB, HCT, PLT in the last 72 hours.        Intake/Output Summary (Last 24 hours) at 10/23/2024 0750 Last data filed at 10/22/2024 1800 Gross per 24 hour  Intake 480 ml  Output --  Net 480 ml         Physical Exam: Vital Signs Blood pressure 98/81, pulse 77, temperature 97.7 F (36.5 C), resp. rate 18, height 5' 8 (1.727 m), weight 87.8 kg, SpO2 100%.  General: No acute distress.  Laying in bed. Mood and affect are appropriate Heart: Regular rate and rhythm no rubs murmurs or extra sounds Lungs: Clear to auscultation, breathing unlabored, no rales or wheezes Abdomen: Positive bowel sounds, soft nontender to palpation, nondistended Extremities: No clubbing, cyanosis, or edema Skin: No evidence of breakdown, no evidence of rash  MSK: No tenderness to palpation throughout left lower extremity, dorsal plantar foot, or ankle squeeze.  Full passive range of motion.  No crepitus or deformity.  Neuro: Awake, alert.  Follows commands appropriately. moderately dysarthric, difficult to understand  Somewhat Ataxic with arms as well B/L RUE- 5/5 throughout RLE- 5/5 throughout  LUE- biceps and triceps 3-/5, FF 4/5, FA 4-/5; WE 0 contracted with increased elbow and wrist flexor tone  LLE- HF 3-/5; KE 4-/5; KF 3-/5; DF 2-/5 and PF 2-/5   Likely contracture L wrist, L EF MAS 1+; flexion contractures L toes, MAS 2 L PF, MAS 1 KF  Sensation to LT intact  Minimal limb dysmetria RLE  Skin:     General: Skin is warm and dry.  Ecchymosis left navicular and 1st MTP medial aspect   Physical exam unchanged from the above on reexamination 10/23/2024     Assessment/Plan: 1. Functional deficits which require 3+ hours per day of interdisciplinary therapy in a comprehensive inpatient rehab setting. Physiatrist is providing close team supervision and 24 hour management of active medical problems listed below. Physiatrist and rehab team continue to assess barriers to discharge/monitor patient progress toward functional and medical goals  Care Tool:  Bathing    Body parts bathed by patient: Left arm, Chest, Abdomen, Front perineal area, Right upper leg, Left upper leg, Face, Right arm, Buttocks   Body parts bathed by helper: Right lower leg, Left lower leg     Bathing assist Assist Level: Minimal Assistance - Patient > 75%     Upper Body Dressing/Undressing Upper body dressing   What is the patient wearing?: Pull over shirt    Upper body assist Assist Level: Supervision/Verbal cueing    Lower Body Dressing/Undressing Lower body dressing      What is the patient wearing?: Underwear/pull up, Pants     Lower body assist Assist for lower body dressing: Minimal Assistance -  Patient > 75%     Toileting Toileting Toileting Activity did not occur Press Photographer and hygiene only): N/A (no void or bm)  Toileting assist Assist for toileting: Contact Guard/Touching assist     Transfers Chair/bed transfer  Transfers assist     Chair/bed transfer assist level: Minimal Assistance - Patient > 75%     Locomotion Ambulation   Ambulation assist      Assist level: Moderate Assistance - Patient 50 - 74% Assistive device: No Device Max distance: 75   Walk 10 feet activity   Assist     Assist level: Moderate Assistance - Patient - 50 - 74% Assistive device: No Device   Walk 50 feet activity   Assist    Assist level: Moderate Assistance - Patient - 50 -  74% Assistive device: No Device    Walk 150 feet activity   Assist    Assist level: Moderate Assistance - Patient - 50 - 74% Assistive device: Hand held assist    Walk 10 feet on uneven surface  activity   Assist     Assist level: Moderate Assistance - Patient - 50 - 74% Assistive device: Hand held assist   Wheelchair     Assist Is the patient using a wheelchair?: Yes Type of Wheelchair: Manual    Wheelchair assist level: Moderate Assistance - Patient 50 - 74% Max wheelchair distance: 150    Wheelchair 50 feet with 2 turns activity    Assist        Assist Level: Moderate Assistance - Patient 50 - 74%   Wheelchair 150 feet activity     Assist      Assist Level: Maximal Assistance - Patient 25 - 49%   Blood pressure 98/81, pulse 77, temperature 97.7 F (36.5 C), resp. rate 18, height 5' 8 (1.727 m), weight 87.8 kg, SpO2 100%.  Medical Problem List and Plan: 1. Functional deficits secondary to L inferior basal ganglia stroke (posterior limb of internal capsule)             -patient may  shower             -ELOS/Goals: 12/15 min A to mod I  -Continue CIR   - 12/6: Add LLE PRAFO for contracture prevention, PF tone - has AFO--tolerating well  12-7: Mom concerned about worsening function of left lower extremity; no notable changes on exam, there is some bruising Left foot over navicular area and 1st MTP, ask orthotist to eval for Left medial foot pressure relief  Current AFO is solid, carbon fiber ~44 yrs old  Orthotics consult written   2.  Antithrombotics: -DVT/anticoagulation:  Pharmaceutical: Lovenox  40mg  daily             -antiplatelet therapy: DAPT X 3 weeks followed by Plavix  alone.  3. Pain Management: Tylenol  and flexeril  prn.  4. Mood/Behavior/Sleep: LCSW to follow for evaluation and support             --ativan  6mg  at nights for sleep/nightmares             -antipsychotic agents: N/A 5. Neuropsych/cognition: This patient is not fully  capable of making decisions on his own behalf. 6. Skin/Wound Care: Routine pressure relief measures.  7. Fluids/Electrolytes/Nutrition: Monitor I/O. Routine labs. Continue Klor CR 10mEq BID 8. Panhypopituitarism: On hydrocortisone  5mg  daily, levothyroxine  100mcg daily, and DDAVP  0.2mg  TID-->followed by Dr. Faythe Currently DDAVP  is 0.1mg  QID  9. Low grade fever: resolved CXR and UA neg 10.  HTN: Monitor  BP tid--on Losartan  25mg  daily  -  BPs fine, monitor  Vitals:   10/19/24 1937 10/20/24 0547 10/20/24 0603 10/20/24 1337  BP: 123/83 117/71 112/74 128/75   10/20/24 1948 10/21/24 0600 10/21/24 1249 10/21/24 2025  BP: 137/76 107/61 129/82 118/69   10/22/24 0535 10/22/24 1616 10/22/24 1935 10/23/24 0550  BP: 106/69 127/85 (!) 131/91 98/81    11.  Hypernatremia: Resolved Sodium trending back up and DDAVP  increased to 2 mg TID today, Mom prefers intranasal route for pt thinks it controls urination better , will change to home dose and monitor 0.1 QID, (sometimes used .2mg  at noc)       Latest Ref Rng & Units 10/23/2024    6:00 AM 10/20/2024    6:31 AM 10/16/2024    5:07 AM  BMP  Glucose 70 - 99 mg/dL 81  88  86   BUN 6 - 20 mg/dL 14  12  12    Creatinine 0.61 - 1.24 mg/dL 8.90  8.86  8.92   Sodium 135 - 145 mmol/L 139  140  139   Potassium 3.5 - 5.1 mmol/L 4.0  4.5  4.0   Chloride 98 - 111 mmol/L 103  104  105   CO2 22 - 32 mmol/L 30  27  27    Calcium  8.9 - 10.3 mg/dL 8.8  8.7  8.6   Recheck BMP stable cont nasal DDAVP   12. Leucocytosis: resolved   13.  H/o Supersella germinoma: Hx of left hemiparesis, cognitive deficits and seizures in the past (no meds)  14. Mild OSA: Unable to tolerate CPAP 15. Mild constipation- pt goes daily at home- might need to intervene if hasn't gone by tomorrow.    - LBM 12/9- continent  16. HLD: continue Rosuvastatin  20mg  daily  17.  Spasticity - LUE and LLE  - Left wrist AND toe contracture vs spasticity consider botulinum toxin injection - discussed  with pt and his mother, mom is in favor , pt unsure  - 12/6: Add Baclofen  5 mg TID; has PRN flexeril . Bracing as above  - 12/7: Some improvement in spasms, mild improvement in elbow flexor tone.  Continue current regimen. Consider Botulinum toxin as outpatient  LOS: 13 days A FACE TO FACE EVALUATION WAS PERFORMED  Nicholas Holt 10/23/2024, 7:50 AM

## 2024-10-23 NOTE — Plan of Care (Signed)
  Problem: Consults Goal: RH STROKE PATIENT EDUCATION Description: See Patient Education module for education specifics  Outcome: Progressing   Problem: RH BOWEL ELIMINATION Goal: RH STG MANAGE BOWEL WITH ASSISTANCE Description: STG Manage Bowel with mod I Assistance. Outcome: Progressing Goal: RH STG MANAGE BOWEL W/MEDICATION W/ASSISTANCE Description: STG Manage Bowel with Medication with mod I Assistance. Outcome: Progressing   Problem: RH SAFETY Goal: RH STG ADHERE TO SAFETY PRECAUTIONS W/ASSISTANCE/DEVICE Description: STG Adhere to Safety Precautions With cues Assistance/Device. Outcome: Progressing   Problem: RH COGNITION-NURSING Goal: RH STG USES MEMORY AIDS/STRATEGIES W/ASSIST TO PROBLEM SOLVE Description: STG Uses Memory Aids/Strategies With cues Assistance to Problem Solve. Outcome: Progressing   Problem: RH KNOWLEDGE DEFICIT Goal: RH STG INCREASE KNOWLEDGE OF HYPERTENSION Description: Patient and parents will be able to manage HTN using educational resources for medications and dietary modification independently Outcome: Progressing Goal: RH STG INCREASE KNOWLEGDE OF HYPERLIPIDEMIA Description: Patient and parents will be able to manage HLD using educational resources for medications and dietary modification independently Outcome: Progressing Goal: RH STG INCREASE KNOWLEDGE OF STROKE PROPHYLAXIS Description: Patient and parents will be able to manage secondary risks using educational resources for medications and dietary modification independently Outcome: Progressing

## 2024-10-23 NOTE — Progress Notes (Signed)
 Occupational Therapy Session Note  Patient Details  Name: Nicholas Holt MRN: [id number] Date of Birth: [birth year]  Today's Date: 10/23/2024 OT Individual Time: 1002-1030+1304-1345 19 mins missed d/t fatigue OT Individual Time Calculation (min): 28 min    Short Term Goals: Week 2:  OT Short Term Goal 1 (Week 2): Pt will be able to hold static stand for 30 seconds with no posterior lean to increase safety and ability to adjust clothing pre and post toileting. OT Short Term Goal 2 (Week 2): Pt will be able to don pants over feet with supervision. OT Short Term Goal 3 (Week 2): pt will complete toilet transfers with LRAD with CGA to close S.  Skilled Therapeutic Interventions/Progress Updates:  Session 1: Pt greeted seated in recliner, pt agreeable to OT intervention.    No pain reported.   Transfers/bed mobility/functional mobility:  Pt completed stand pivot transfers with MIN HHA on R side.   Therapeutic activity:  Simulated LB dressing tasks with pt instructed to stand from EOM to remove clothespins positioned on pts pants to simulate pericare/LB dressing. Pt completed task with CGA- MINA, MIN A needed when pt leans backwards but able to correct posterior lean with MIN cues.   Pt then completed additional LB dressing task with pt instructed to don theraband as if he was donning pants. Pt needed MIN A to thread band over RLE d/t impaired functional use of L hand. Pt able to stand with CGA to pull up to waist line, MIN cues to correct posterior lean into mat from standing.   Pt completed various standing reaching tasks to facilitate anterior weightshift such as tossing ball to target and reaching across midline to match items on mirror. Pt completed all tasks with MIN A for balance, d/t posterior lean.                 Ended session with pt seated  in w/c with all needs within reach and pts father present.       Session 2: Pt greeted seated in recliner, pt agreeable to OT  intervention.    No pain reported during session.   Transfers/bed mobility/functional mobility:  Pt completed stand pivot transfers with MIN HHA.   Therapeutic activity:  Pt completed standing balance tasks from EOM with pt instructed to toss bean bags to target to facilitate improved forward weight shift and to challenge reactive balance. Pt completed task with MIN A for balance d/t posterior lean. Pt can correct LOB when focused however pt needs repetitive cues to attend to task.   Pt completed standing LLE motor planning task with pt instructed to step to targets on floor with LLE with BUE support to improve LLE motor planning. Emphasis on picking foot up vs sliding foot as pregait task. Pt completed task with MIN A for balance but MOD cues for feet placement.   Pt then requested to work on dancing I.e lateral weights to music to facilitate improved motor planning in LLE and to improve standing balance. Played pts preferred music and had pt step side<>side to beat of music to improve motor planning and balance. Pt completed task with MIN HHA but continued to need MAX cues to focus on task as pt would become distracted by extraneous stimuli and forget to complete task.    Pt then reports fatigue asking to return to room. Pt transported back to room with total A. Pt left seated in w/c with all needs within reach and parents present.  Therapy Documentation Precautions:  Precautions Precautions: Fall Recall of Precautions/Restrictions: Impaired Precaution/Restrictions Comments: Left AFO, h/o left hemiplegia Restrictions Weight Bearing Restrictions Per Provider Order: No   Therapy/Group: Individual Therapy  Nicholas Holt 10/23/2024, 12:14 PM

## 2024-10-23 NOTE — Progress Notes (Signed)
 Physical Therapy Session Note  Patient Details  Name: Nicholas Holt MRN: [id number] Date of Birth: 1981-08-06  Today's Date: 10/23/2024 PT Individual Time: 1050-1135; 1405 - 1445 PT Individual Time Calculation (min): 45 min; 40 min   Short Term Goals: Week 2:  PT Short Term Goal 1 (Week 2): STG = LTG  SESSION 1 Skilled Therapeutic Interventions/Progress Updates: Patient sitting in WC on entrance to room. Patient alert and agreeable to PT session.   Patient reported no pain  Therapeutic Activity: Transfers: Pt performed sit<>stand transfers throughout session in preparation for interventions with CGA (when adhering to anterior weight shift cue) and min/modA due to retropulsion (cue to reach forward assists with weight shift - pt continues to demonstrate poor cognitive carryover of cue).  Neuromuscular Re-ed: NMR facilitated during session with focus on dynamic standing balance, coordination/motor plan. - Pt participated in 4 rounds of connect four (big one) while standing. Pt cued to reach forward when posterior weight shift happens. Pt required CGA >50% of the time, but modA to prevent posterior LOB with pt having decreased reaction time. Pt did, however, demonstrate taking necessary stepping strategy with R LE (retro) to assist self with maintaining standing balance (still required minA). Pt required one seated rest break during 3rd round and was cued to finish game sitting edge of WC with max cuing to maintain R foot on floor and upright sit at trunk.  - PT stepping to 6 step with L LE and 5lb ankle weight donned. Pt with R UE support. Pt cued to also increase L hip flexion to step off step vs sliding (pt unable to motor plan movement but able to lift foot off step and back down, or to touch edge of next step - this did not improve as rounds progressed). Pt required overall CGA throughout. Pt performed 2 rounds and required same cueing and seated rest with pt requesting return to room to  rest.   NMR performed for improvements in motor control and coordination, balance, sequencing, judgement, and self confidence/ efficacy in performing all aspects of mobility at highest level of independence.   Patient sitting in WC at end of session with brakes locked, family present, and all needs within reach.  SESSION 2 Skilled Therapeutic Interventions/Progress Updates: Patient sitting in [facility] with family present on entrance to room. Patient alert and agreeable to PT session.   Patient reported no pain  Therapeutic Activity: Transfers: Pt performed sit<>stand transfers throughout session with min/heavy minA. Provided VC for anterior weight shift.  - [employer] rep arrived during session to further assess pt's L AFO. [employer] rep stated that pt's current AFO is slightly plantarflexed (pt's shoe possibly has unknown rise from toe to heel) which would increase pt's tendency to ambulate on front of foot as current presentation. Pt with L anterior leaf AFO donned with noted improvement in step-through to plant heel (still elevated, but not as bad). Pt still required max cuing to decrease cadence and modA, but can be minA when slowing down cadence and coordinating L LE through gait cycle. [employer] rep to drop off demo L anterior leaf AFO to further assess if this brace is  better fit  Neuromuscular Re-ed: NMR facilitated during session with focus on proprioception, attention to task, WB. - Pt ambulated multiple rounds in and around main gym without AD (100'+) and pt still requiring modA (skilled) with pt continuing to demonstrate decreased step length/clearance/heel-strike (walks on forefront of foot) on L LE. Pt requires totalA to facilitate weight  shift and max cuing to decrease cadence to avoid anterior LOB.  NMR performed for improvements in motor control and coordination, balance, sequencing, judgement, and self confidence/ efficacy in performing all aspects of mobility at highest level of independence.    Patient sitting in WC at end of session with brakes locked, family present and all needs within reach.       Therapy Documentation Precautions:  Precautions Precautions: Fall Recall of Precautions/Restrictions: Impaired Precaution/Restrictions Comments: Left AFO, h/o left hemiplegia Restrictions Weight Bearing Restrictions Per Provider Order: No  Therapy/Group: Individual Therapy  Saamir Armstrong PTA 10/23/2024, 11:50 AM

## 2024-10-24 DIAGNOSIS — E870 Hyperosmolality and hypernatremia: Secondary | ICD-10-CM

## 2024-10-24 DIAGNOSIS — G8114 Spastic hemiplegia affecting left nondominant side: Secondary | ICD-10-CM

## 2024-10-24 MED ORDER — BACLOFEN 10 MG PO TABS
10.0000 mg | ORAL_TABLET | Freq: Two times a day (BID) | ORAL | Status: DC
  Administered 2024-10-24 – 2024-10-31 (×14): 10 mg via ORAL
  Filled 2024-10-24 (×14): qty 1

## 2024-10-24 NOTE — Progress Notes (Signed)
 Speech Language Pathology Daily Session Note  Patient Details  Name: Nicholas Holt MRN: [id number] Date of Birth: May 25, 1981  Today's Date: 10/24/2024 SLP Individual Time: 1100-1158 SLP Individual Time Calculation (min): 58 min  Short Term Goals: Week 2: SLP Short Term Goal 1 (Week 2): STG = LTG due to ELOS  Skilled Therapeutic Interventions: Skilled therapy session focused on communication goals. SLP facilitated session by reviewing speech intelligibility strategies and use of pacing board. Patient utilized pacing board to reach 90% intelligibility at the phrase level given minA. SLP then challenged patient by prompting him to utilize pacing board during simple conversation with unfamiliar listeners. Patient required significantly increased maxA cues to reach 60% intelligibility. SLP educated patients family on current progress and provided handout for practice outside of tx. Patient left in [facility] with family present. Continue POC  Pain None reported   Therapy/Group: Individual Therapy  Sheletha Bow M.A., CCC-SLP 10/24/2024, 7:38 AM

## 2024-10-24 NOTE — Progress Notes (Signed)
 Physical Therapy Session Note  Patient Details  Name: Nicholas Holt MRN: [id number] Date of Birth: 10-Jul-1981  Today's Date: 10/24/2024 PT Individual Time: 1020-1100; 1350 - 1433 PT Individual Time Calculation (min): 40 min; 33 min   Short Term Goals: Week 2:  PT Short Term Goal 1 (Week 2): STG = LTG  SESSION 1 Skilled Therapeutic Interventions/Progress Updates: Patient sitting in [facility] with family present on entrance to room. Patient alert and agreeable to PT session.   Patient reported no pain.  Therapeutic Activity: Transfers: Pt performed sit<>stand transfers throughout session with minA and continued cuing to lean forward to avoid retropulsion.  Neuromuscular Re-ed: NMR facilitated during session with focus on dynamic standing balance, attention to task, safety awareness, proprioception, coordination. - Pt ambulated 150'+without AD and with new AFO donned (demo) with pt demonstrating improvement in step through vs stepping on forefront of foot (heel still not maintaining contact with floor but it was better than with personal AFO). Pt required max cuing to decrease cadence (made no changes to speed). Pt with decreased weight shift to R.  - Pt ambulated with + 2 walking in front as visual cue to slow down (+ 2 instructed to walk slow). And pt cued to avoid walking past or walking into + 2. Pt with overall min/modA and required max cuing to avoid walking past or into + 2 with PTA providing hinted cue and pt verbalizing error but making little to no correction.  - PTA allowed pt to ambulate as pt would without VC to decrease cadence with pt demonstrating increased step length but still decrease in clearance. Pt required overall CGA/minA for safety but maintained standing balance. Pt demonstrated this as well in front of parents down hallway with pt family reporting pt did not stomp on ground as much prior to admission. PTA continued to educate that pt poor safety awareness and decreased  ability to correct cadence and improve L step clearance adds to pt's increased potential risk of anterior LOB.   NMR performed for improvements in motor control and coordination, balance, sequencing, judgement, and self confidence/ efficacy in performing all aspects of mobility at highest level of independence.   Patient sitting in WC at end of session with brakes locked, family present, and all needs within reach.  SESSION 2 Skilled Therapeutic Interventions/Progress Updates: Patient sitting in WC on entrance to room. Patient alert and agreeable to PT session.   Patient reported no pain. Pt father assisted with ambulation to bathroom from [facility] to observe transfer in room safety. Pt and pt father performed safety with pt father providing appropriate cues to slow down and to pick up L LE. Pt transported to main gym and PTA obtained L PFRW and adjusted to height. Pt ambulated 200'+ from main gym to day room and around nsg station and back to main gym with overall CGA to maintain standing balance, but maxA/VC to navigate safely around obstacles. Pt with poor safety awareness and attention to R side (R>L) with little to no correction to avoid running into objects. Pt aware of objects after PTA asked if pt saw what he had bumped into with pt stating he did. Pt L UE tone made adjusting platform difficult as pt UE would slide out despite strap. PTA donned padding in front of forearm pad with added strap and noted improvement with UE staying on platform. Further assessment will be needed in order to adjust PFRW to pt's fit. Pt father observed pt ambulating in AD at end  of session and PTA stating that there are pros and cons with this. Pt did have decreased cadence (minor) but managing AD requires higher level of awareness to safely navigate around obstacles. PTA also stated to pt father and pt that AD also assists with standing balance to decrease risk of anterior LOB. Further trials with this AD will be required. Pt  performed sit<>stands throughout session with minA (mostly) when adhering to anterior lean to stand, and modA once due to retropulsion.   Patient sitting in recliner at end of session with brakes locked, father present, and all needs within reach.       Therapy Documentation Precautions:  Precautions Precautions: Fall Recall of Precautions/Restrictions: Impaired Precaution/Restrictions Comments: Left AFO, h/o left hemiplegia Restrictions Weight Bearing Restrictions Per Provider Order: No  Therapy/Group: Individual Therapy  Kent Riendeau PTA 10/24/2024, 12:08 PM

## 2024-10-24 NOTE — Plan of Care (Signed)
  Problem: Consults Goal: RH STROKE PATIENT EDUCATION Description: See Patient Education module for education specifics  Outcome: Progressing   Problem: RH BOWEL ELIMINATION Goal: RH STG MANAGE BOWEL WITH ASSISTANCE Description: STG Manage Bowel with mod I Assistance. Outcome: Progressing Goal: RH STG MANAGE BOWEL W/MEDICATION W/ASSISTANCE Description: STG Manage Bowel with Medication with mod I Assistance. Outcome: Progressing   Problem: RH SAFETY Goal: RH STG ADHERE TO SAFETY PRECAUTIONS W/ASSISTANCE/DEVICE Description: STG Adhere to Safety Precautions With cues Assistance/Device. Outcome: Progressing   Problem: RH COGNITION-NURSING Goal: RH STG USES MEMORY AIDS/STRATEGIES W/ASSIST TO PROBLEM SOLVE Description: STG Uses Memory Aids/Strategies With cues Assistance to Problem Solve. Outcome: Progressing   Problem: RH KNOWLEDGE DEFICIT Goal: RH STG INCREASE KNOWLEDGE OF HYPERTENSION Description: Patient and parents will be able to manage HTN using educational resources for medications and dietary modification independently Outcome: Progressing Goal: RH STG INCREASE KNOWLEGDE OF HYPERLIPIDEMIA Description: Patient and parents will be able to manage HLD using educational resources for medications and dietary modification independently Outcome: Progressing Goal: RH STG INCREASE KNOWLEDGE OF STROKE PROPHYLAXIS Description: Patient and parents will be able to manage secondary risks using educational resources for medications and dietary modification independently Outcome: Progressing

## 2024-10-24 NOTE — Progress Notes (Signed)
 PROGRESS NOTE   Subjective/Complaints:  Pt in room with OT and parents. Has off-shelf AFO on left lower extremity. OT says it helped quite a bit with his ambulation  ROS: Patient denies fever, rash, sore throat, blurred vision, dizziness, nausea, vomiting, diarrhea, cough, shortness of breath or chest pain, joint or back/neck pain, headache, or mood change.   Objective:   No results found.  No results for input(s): WBC, HGB, HCT, PLT in the last 72 hours.        Intake/Output Summary (Last 24 hours) at 10/24/2024 1007 Last data filed at 10/24/2024 0725 Gross per 24 hour  Intake 478 ml  Output --  Net 478 ml         Physical Exam: Vital Signs Blood pressure 137/86, pulse 64, temperature 98.2 F (36.8 C), resp. rate 18, height 5' 8 (1.727 m), weight 87.8 kg, SpO2 100%.  Constitutional: No distress . Vital signs reviewed. HEENT: NCAT, EOMI, oral membranes moist Neck: supple Cardiovascular: RRR without murmur. No JVD    Respiratory/Chest: CTA Bilaterally without wheezes or rales. Normal effort    GI/Abdomen: BS +, non-tender, non-distended Ext: no clubbing, cyanosis, or edema Psych: pleasant and cooperative  Skin: No evidence of breakdown, no evidence of rash  MSK: No tenderness to palpation throughout left lower extremity, dorsal plantar foot, or ankle squeeze.  Full passive range of motion.  No crepitus or deformity.  Neuro: Awake, alert.  Follows commands appropriately. moderately dysarthric,  Somewhat Ataxic with arms as well B/L RUE- 5/5 throughout RLE- 5/5 throughout  LUE- biceps and triceps 3-/5, FF 4/5, FA 4-/5; WE 0 contracted with increased elbow and wrist flexor tone  LLE- HF 3-/5; KE 4-/5; KF 3-/5; DF 2-/5 and PF 2-/5   Likely contracture L wrist, -5 to 10 degrees in flexion from neutral, L EF MAS 1+; flexion contractures L toes, MAS 2 L PF, MAS 1 KF  Sensation to LT intact  Minimal  limb dysmetria RLE  Physical exam unchanged from the above on reexamination 10/24/2024     Assessment/Plan: 1. Functional deficits which require 3+ hours per day of interdisciplinary therapy in a comprehensive inpatient rehab setting. Physiatrist is providing close team supervision and 24 hour management of active medical problems listed below. Physiatrist and rehab team continue to assess barriers to discharge/monitor patient progress toward functional and medical goals  Care Tool:  Bathing    Body parts bathed by patient: Left arm, Chest, Abdomen, Front perineal area, Right upper leg, Left upper leg, Face, Right arm, Buttocks   Body parts bathed by helper: Right lower leg, Left lower leg     Bathing assist Assist Level: Minimal Assistance - Patient > 75%     Upper Body Dressing/Undressing Upper body dressing   What is the patient wearing?: Pull over shirt    Upper body assist Assist Level: Supervision/Verbal cueing    Lower Body Dressing/Undressing Lower body dressing      What is the patient wearing?: Underwear/pull up, Pants     Lower body assist Assist for lower body dressing: Minimal Assistance - Patient > 75%     Toileting Toileting Toileting Activity did not occur Press Photographer and  hygiene only): N/A (no void or bm)  Toileting assist Assist for toileting: Contact Guard/Touching assist     Transfers Chair/bed transfer  Transfers assist     Chair/bed transfer assist level: Contact Guard/Touching assist     Locomotion Ambulation   Ambulation assist      Assist level: Moderate Assistance - Patient 50 - 74% Assistive device: No Device Max distance: 75   Walk 10 feet activity   Assist     Assist level: Moderate Assistance - Patient - 50 - 74% Assistive device: No Device   Walk 50 feet activity   Assist    Assist level: Moderate Assistance - Patient - 50 - 74% Assistive device: No Device    Walk 150 feet activity   Assist     Assist level: Moderate Assistance - Patient - 50 - 74% Assistive device: Hand held assist    Walk 10 feet on uneven surface  activity   Assist     Assist level: Moderate Assistance - Patient - 50 - 74% Assistive device: Hand held assist   Wheelchair     Assist Is the patient using a wheelchair?: Yes Type of Wheelchair: Manual    Wheelchair assist level: Moderate Assistance - Patient 50 - 74% Max wheelchair distance: 150    Wheelchair 50 feet with 2 turns activity    Assist        Assist Level: Moderate Assistance - Patient 50 - 74%   Wheelchair 150 feet activity     Assist      Assist Level: Maximal Assistance - Patient 25 - 49%   Blood pressure 137/86, pulse 64, temperature 98.2 F (36.8 C), resp. rate 18, height 5' 8 (1.727 m), weight 87.8 kg, SpO2 100%.  Medical Problem List and Plan: 1. Functional deficits secondary to L inferior basal ganglia stroke (posterior limb of internal capsule)             -patient may  shower             -ELOS/Goals: 12/15 min A to mod I  -Continue CIR   - 12/6: Add LLE PRAFO for contracture prevention, PF tone - has AFO--tolerating well  12-7: Mom concerned about worsening function of left lower extremity; no notable changes on exam, there is some bruising Left foot over navicular area and 1st MTP, ask orthotist to eval for Left medial foot pressure relief  Current AFO is solid, carbon fiber ~44 yrs old    12/12 awaiting custom AFO for LLE.   2.  Antithrombotics: -DVT/anticoagulation:  Pharmaceutical: Lovenox  40mg  daily             -antiplatelet therapy: DAPT X 3 weeks followed by Plavix  alone.  3. Pain Management: Tylenol  and flexeril  prn.  4. Mood/Behavior/Sleep: LCSW to follow for evaluation and support             --ativan  6mg  at nights for sleep/nightmares             -antipsychotic agents: N/A 5. Neuropsych/cognition: This patient is not fully capable of making decisions on his own behalf. 6. Skin/Wound  Care: Routine pressure relief measures.  7. Fluids/Electrolytes/Nutrition: Monitor I/O. Routine labs. Continue Klor CR 10mEq BID 8. Panhypopituitarism: On hydrocortisone  5mg  daily, levothyroxine  100mcg daily, and DDAVP  0.2mg  TID-->followed by Dr. Faythe Currently DDAVP  is 0.1mg  QID  9. Low grade fever: resolved CXR and UA neg 10.  HTN: Monitor BP tid--on Losartan  25mg  daily  12/12-  BPs under reasonable control  Vitals:  10/20/24 1337 10/20/24 1948 10/21/24 0600 10/21/24 1249  BP: 128/75 137/76 107/61 129/82   10/21/24 2025 10/22/24 0535 10/22/24 1616 10/22/24 1935  BP: 118/69 106/69 127/85 (!) 131/91   10/23/24 0550 10/23/24 1629 10/23/24 1951 10/24/24 0456  BP: 98/81 111/67 125/80 137/86    11.  Hypernatremia: Resolved Sodium trending back up and DDAVP  increased to 2 mg TID today, Mom prefers intranasal route for pt thinks it controls urination better , will change to home dose and monitor 0.1 QID, (sometimes used .2mg  at noc)       Latest Ref Rng & Units 10/23/2024    6:00 AM 10/20/2024    6:31 AM 10/16/2024    5:07 AM  BMP  Glucose 70 - 99 mg/dL 81  88  86   BUN 6 - 20 mg/dL 14  12  12    Creatinine 0.61 - 1.24 mg/dL 8.90  8.86  8.92   Sodium 135 - 145 mmol/L 139  140  139   Potassium 3.5 - 5.1 mmol/L 4.0  4.5  4.0   Chloride 98 - 111 mmol/L 103  104  105   CO2 22 - 32 mmol/L 30  27  27    Calcium  8.9 - 10.3 mg/dL 8.8  8.7  8.6   Recheck BMP stable 12/11-- cont nasal DDAVP   12. Leucocytosis: resolved   13.  H/o Supersella germinoma: Hx of left hemiparesis, cognitive deficits and seizures in the past (no meds)  14. Mild OSA: Unable to tolerate CPAP 15. Mild constipation- pt goes daily at home- might need to intervene if hasn't gone by tomorrow.    - LBM 12/10- continent  16. HLD: continue Rosuvastatin  20mg  daily  17.  Spasticity - LUE and LLE  - Left wrist AND toe contracture vs spasticity consider botulinum toxin injection - discussed with pt and his mother, mom is in  favor , pt unsure  - 12/6: Add Baclofen  5 mg TID; has PRN flexeril . Bracing as above  - 12/7: Some improvement in spasms, mild improvement in elbow flexor tone.     -12/12: increase baclofen  to 10mg  bid and observe for effect. Could increase to TID this weekend if he tolerates the bid dosing.     -Consider Botulinum toxin as outpatient  LOS: 14 days A FACE TO FACE EVALUATION WAS PERFORMED  Nicholas Holt 10/24/2024, 10:07 AM

## 2024-10-24 NOTE — Progress Notes (Signed)
 Occupational Therapy Weekly Progress Note  Patient Details  Name: Nicholas Holt MRN: [id number] Date of Birth: [birth year]  Beginning of progress report period: October 17, 2024 End of progress report period: October 24, 2024  Today's Date: 10/24/2024 OT Individual Time: 9167-9064 OT Individual Time Calculation (min): 63 min    Patient has met 3 of 3 short term goals.    Patient continues to demonstrate the following deficits: abnormal tone, unbalanced muscle activation, and decreased motor planning, decreased midline orientation, decreased awareness, decreased problem solving, and decreased memory, and decreased sitting balance, decreased standing balance, decreased postural control, hemiplegia, and decreased balance strategies and therefore will continue to benefit from skilled OT intervention to enhance overall performance with BADL and Reduce care partner burden.  Patient progressing toward long term goals..  Plan of care revisions: .SABRA Problem: RH Balance Goal: LTG Patient will maintain dynamic standing with ADLs (OT) Description: LTG:  Patient will maintain dynamic standing balance with assist during activities of daily living (OT)  Flowsheets (Taken 10/21/2024 1258) LTG: Pt will maintain dynamic standing balance during ADLs with: (LTG upgraded due to pt's progress.) Contact Guard/Touching assist Note: LTG upgraded due to pt's progress.    Problem: Sit to Stand Goal: LTG:  Patient will perform sit to stand in prep for activites of daily living with assistance level (OT) Description: LTG:  Patient will perform sit to stand in prep for activites of daily living with assistance level (OT) Flowsheets (Taken 10/21/2024 1258) LTG: PT will perform sit to stand in prep for activites of daily living with assistance level: (LTG upgraded due to pt's progress.) Contact Guard/Touching assist Note: LTG upgraded due to pt's progress.    Problem: RH Bathing Goal: LTG Patient will bathe all  body parts with assist levels (OT) Description: LTG: Patient will bathe all body parts with assist levels (OT) Flowsheets (Taken 10/21/2024 1258) LTG: Pt will perform bathing with assistance level/cueing: (LTG upgraded due to pt's progress.) Contact Guard/Touching assist Note: LTG upgraded due to pt's progress.    Problem: RH Dressing Goal: LTG Patient will perform lower body dressing w/assist (OT) Description: LTG: Patient will perform lower body dressing with assist, with/without cues in positioning using equipment (OT) Flowsheets (Taken 10/21/2024 1258) LTG: Pt will perform lower body dressing with assistance level of: (LTG upgraded due to pt's progress. (for underwear and pants only)) Contact Guard/Touching assist Note: LTG upgraded due to pt's progress. (for underwear and pants only)    Problem: RH Toileting Goal: LTG Patient will perform toileting task (3/3 steps) with assistance level (OT) Description: LTG: Patient will perform toileting task (3/3 steps) with assistance level (OT)  Flowsheets (Taken 10/21/2024 1258) LTG: Pt will perform toileting task (3/3 steps) with assistance level: (LTG upgraded due to pt's progress.) Contact Guard/Touching assist Note: LTG upgraded due to pt's progress.    Problem: RH Toilet Transfers Goal: LTG Patient will perform toilet transfers w/assist (OT) Description: LTG: Patient will perform toilet transfers with assist, with/without cues using equipment (OT) Flowsheets (Taken 10/21/2024 1258) LTG: Pt will perform toilet transfers with assistance level of: (LTG upgraded due to pt's progress.) Contact Guard/Touching assist Note: LTG upgraded due to pt's progress.    Problem: RH Tub/Shower Transfers Goal: LTG Patient will perform tub/shower transfers w/assist (OT) Description: LTG: Patient will perform tub/shower transfers with assist, with/without cues using equipment (OT) Flowsheets (Taken 10/21/2024 1258) LTG: Pt will perform tub/shower stall transfers  with assistance level of: (LTG upgraded due to pt's progress.) Contact Guard/Touching assist LTG:  Pt will perform tub/shower transfers from: Tub/shower combination Note: LTG upgraded due to pt's progress.               OT Short Term Goals Week 1:  OT Short Term Goal 1 (Week 1): Patient will sit unsupported at edge of bed x 15 seconds as needed to prepare for level surface transfer to chair. OT Short Term Goal 1 - Progress (Week 1): Progressing toward goal ((he is sitting unsupported for 5-10 seconds at the most, unless his R hand is on a stedy surface in front of him with frequent cues to not let go of surface)) OT Short Term Goal 2 (Week 1): Patient will sufficiently lean forward while seated to safely reach to feet during bathing. OT Short Term Goal 2 - Progress (Week 1): Met OT Short Term Goal 3 (Week 1): Patient will transition to standing from sitting with min assist as needed to pull up/down clothing during BADL. OT Short Term Goal 3 - Progress (Week 1): Met Week 2:  OT Short Term Goal 1 (Week 2): Pt will be able to hold static stand for 30 seconds with no posterior lean to increase safety and ability to adjust clothing pre and post toileting. OT Short Term Goal 1 - Progress (Week 2): Met OT Short Term Goal 2 (Week 2): Pt will be able to don pants over feet with supervision. OT Short Term Goal 2 - Progress (Week 2): Met OT Short Term Goal 3 (Week 2): pt will complete toilet transfers with LRAD with CGA to close S. OT Short Term Goal 3 - Progress (Week 2): Met Week 3:  OT Short Term Goal 1 (Week 3): STGs = upgraded LTGs  Skilled Therapeutic Interventions/Progress Updates:    Pt received in room with parents. He was bathed and dressed already. His parents confirmed he has been following through with getting pants over his feet without A.  Had pt practice standing from low recliner and ambulating in and out of bathroom with HHA and min A.  Stand to sits CGA to sit to recliner, toilet,  wc, mat in gym but he is able to stand up with close  -pt demonstrated that he could manage his clothing down and up without A  Taken to gym to focus on sit and stand balance with forward reach.  Very minimal posterior lean.  Pt needs cues to look down when stepping backwards to prevent posterior lean Good use of LUE in reaching tasks.   Pt needs to continue working on stepping forward on L foot to increase dynamic reach to the L.    Excellent participation.  Returned to room with all needs met.   Therapy Documentation Precautions:  Precautions Precautions: Fall Recall of Precautions/Restrictions: Impaired Precaution/Restrictions Comments: Left AFO, h/o left hemiplegia Restrictions Weight Bearing Restrictions Per Provider Order: No   Pain: Pain Assessment Pain Score: 0-No pain ADL: ADL Eating: Set up Grooming: Setup Where Assessed-Grooming: Sitting at sink Upper Body Bathing: Supervision/safety Where Assessed-Upper Body Bathing: Shower Lower Body Bathing: Minimal assistance Where Assessed-Lower Body Bathing: Shower Upper Body Dressing: Supervision/safety Where Assessed-Upper Body Dressing: Sitting at sink Lower Body Dressing:  (Supervision pants, mod shoes) Where Assessed-Lower Body Dressing: Wheelchair Toileting: Contact guard Where Assessed-Toileting: Teacher, Adult Education: Furniture Conservator/restorer Method: Proofreader: Engineer, Technical Sales: Contact guard, Minimal assistance Tub/Shower Transfer Method: Ship Broker: Emergency planning/management officer, Acupuncturist: Insurance Underwriter Method: Optometrist  Equipment: Grab bars ADL Comments: improving with posterior lean but needs constant cues   Therapy/Group: Individual Therapy  Kaleigha Chamberlin 10/24/2024, 10:15 AM

## 2024-10-24 NOTE — Progress Notes (Signed)
 Physical Therapy Weekly Progress Note  Patient Details  Name: Nicholas Holt MRN: [id number] Date of Birth: 1981-03-31  Beginning of progress report period: October 19, 2024 End of progress report period: October 24, 2024  No STG's set due to pt's extended LOS in order to improve pt's functional independence and to decrease caregiver burden. Pt currently ambulates minA with no AD (trial of L PFRW initiated this week with pt demonstrating CGA to maintain standing balance, but max cuing to to avoid obstacles in hallway due to pt's poor safety/emergent awareness). Pt transfers with minA with no AD. Pt family has initiated gait in room for transfers to bathroom and is aware of pt's overall physical deficits and poor safety awareness and poor carryover of cues. Pt family is to begin family education in the days leading to pt's d/c. Pt received new DEMO anterior leaf AFO that has improved step through pattern as pt previously ambulated on L forefront of foot (potentially due to pt's personal AFO having slight plantarflexion from years of use per [facility] rep).   Patient continues to demonstrate the following deficits muscle weakness and muscle joint tightness, decreased cardiorespiratoy endurance, impaired timing and sequencing, abnormal tone, unbalanced muscle activation, and decreased coordination, decreased attention to left, decreased attention, decreased awareness, decreased problem solving, and decreased safety awareness, and decreased sitting balance, decreased standing balance, decreased postural control, hemiplegia, and decreased balance strategies and therefore will continue to benefit from skilled PT intervention to increase functional independence with mobility.  Patient progressing toward long term goals..  Continue plan of care.  PT Short Term Goals Week 2:  PT Short Term Goal 1 (Week 2): STG = LTG PT Short Term Goal 1 - Progress (Week 2): Progressing toward goal Week 3:  PT Short  Term Goal 1 (Week 3): = LTGs due ELOS (extended)  Skilled Therapeutic Interventions/Progress Updates:  Ambulation/gait training;Community reintegration;Neuromuscular re-education;Stair training;UE/LE Strength taining/ROM;UE/LE Coordination activities;Therapeutic Activities;Discharge planning;Balance/vestibular training;Functional mobility training;Patient/family education;Therapeutic Exercise;Cognitive remediation/compensation;DME/adaptive equipment instruction;Disease management/prevention;Pain management;Splinting/orthotics;Skin care/wound management;Psychosocial support   Therapy Documentation Precautions:  Precautions Precautions: Fall Recall of Precautions/Restrictions: Impaired Precaution/Restrictions Comments: Left AFO, h/o left hemiplegia Restrictions Weight Bearing Restrictions Per Provider Order: No  Dominic Sandoval PTA   10/24/2024, 3:19 PM  Donald WENDI Pereyra, PT, DPT, CBIS 10/24/2024

## 2024-10-25 NOTE — Plan of Care (Signed)
  Problem: Consults Goal: RH STROKE PATIENT EDUCATION Description: See Patient Education module for education specifics  Outcome: Progressing   Problem: RH BOWEL ELIMINATION Goal: RH STG MANAGE BOWEL WITH ASSISTANCE Description: STG Manage Bowel with mod I Assistance. Outcome: Progressing Goal: RH STG MANAGE BOWEL W/MEDICATION W/ASSISTANCE Description: STG Manage Bowel with Medication with mod I Assistance. Outcome: Progressing   Problem: RH SAFETY Goal: RH STG ADHERE TO SAFETY PRECAUTIONS W/ASSISTANCE/DEVICE Description: STG Adhere to Safety Precautions With cues Assistance/Device. Outcome: Progressing   Problem: RH COGNITION-NURSING Goal: RH STG USES MEMORY AIDS/STRATEGIES W/ASSIST TO PROBLEM SOLVE Description: STG Uses Memory Aids/Strategies With cues Assistance to Problem Solve. Outcome: Progressing   Problem: RH KNOWLEDGE DEFICIT Goal: RH STG INCREASE KNOWLEDGE OF HYPERTENSION Description: Patient and parents will be able to manage HTN using educational resources for medications and dietary modification independently Outcome: Progressing Goal: RH STG INCREASE KNOWLEGDE OF HYPERLIPIDEMIA Description: Patient and parents will be able to manage HLD using educational resources for medications and dietary modification independently Outcome: Progressing Goal: RH STG INCREASE KNOWLEDGE OF STROKE PROPHYLAXIS Description: Patient and parents will be able to manage secondary risks using educational resources for medications and dietary modification independently Outcome: Progressing

## 2024-10-25 NOTE — Progress Notes (Addendum)
 PROGRESS NOTE   Subjective/Complaints:  Pt doing well, slept well, denies pain, LBM last night, urinating fine. No other complaints or concerns.   ROS: as per HPI. Denies CP, SOB, abd pain, N/V/D/C, or any other complaints at this time.    Objective:   No results found.  No results for input(s): WBC, HGB, HCT, PLT in the last 72 hours.        Intake/Output Summary (Last 24 hours) at 10/25/2024 1009 Last data filed at 10/25/2024 0845 Gross per 24 hour  Intake 1029 ml  Output --  Net 1029 ml         Physical Exam: Vital Signs Blood pressure 109/75, pulse 64, temperature 97.8 F (36.6 C), temperature source Oral, resp. rate 16, height 5' 8 (1.727 m), weight 87.8 kg, SpO2 100%.  Constitutional: No distress . Vital signs reviewed. Resting comfortably in bed.  HEENT: NCAT, EOMI, oral membranes moist Neck: supple Cardiovascular: RRR without murmur. No JVD    Respiratory/Chest: CTA Bilaterally without wheezes or rales. Normal effort    GI/Abdomen: BS +, non-tender, non-distended, soft Ext: no clubbing, cyanosis, or edema Psych: pleasant and cooperative  Skin: No evidence of breakdown, no evidence of rash over exposed surfaces   PRIOR EXAMS: MSK: No tenderness to palpation throughout left lower extremity, dorsal plantar foot, or ankle squeeze.  Full passive range of motion.  No crepitus or deformity.  Neuro: Awake, alert.  Follows commands appropriately. moderately dysarthric,  Somewhat Ataxic with arms as well B/L RUE- 5/5 throughout RLE- 5/5 throughout  LUE- biceps and triceps 3-/5, FF 4/5, FA 4-/5; WE 0 contracted with increased elbow and wrist flexor tone  LLE- HF 3-/5; KE 4-/5; KF 3-/5; DF 2-/5 and PF 2-/5   Likely contracture L wrist, -5 to 10 degrees in flexion from neutral, L EF MAS 1+; flexion contractures L toes, MAS 2 L PF, MAS 1 KF  Sensation to LT intact  Minimal limb dysmetria  RLE    Assessment/Plan: 1. Functional deficits which require 3+ hours per day of interdisciplinary therapy in a comprehensive inpatient rehab setting. Physiatrist is providing close team supervision and 24 hour management of active medical problems listed below. Physiatrist and rehab team continue to assess barriers to discharge/monitor patient progress toward functional and medical goals  Care Tool:  Bathing    Body parts bathed by patient: Left arm, Chest, Abdomen, Front perineal area, Right upper leg, Left upper leg, Face, Right arm, Buttocks   Body parts bathed by helper: Right lower leg, Left lower leg     Bathing assist Assist Level: Minimal Assistance - Patient > 75%     Upper Body Dressing/Undressing Upper body dressing   What is the patient wearing?: Pull over shirt    Upper body assist Assist Level: Supervision/Verbal cueing    Lower Body Dressing/Undressing Lower body dressing      What is the patient wearing?: Underwear/pull up, Pants     Lower body assist Assist for lower body dressing: Minimal Assistance - Patient > 75%     Toileting Toileting Toileting Activity did not occur (Clothing management and hygiene only): N/A (no void or bm)  Toileting assist Assist for  toileting: Contact Guard/Touching assist     Transfers Chair/bed transfer  Transfers assist     Chair/bed transfer assist level: Contact Guard/Touching assist     Locomotion Ambulation   Ambulation assist      Assist level: Minimal Assistance - Patient > 75% (Needing max cuing to avoid obstacles in hallway due to poor safety/emergent awareness) Assistive device: Walker-platform Max distance: >200   Walk 10 feet activity   Assist     Assist level: Minimal Assistance - Patient > 75% Assistive device: Walker-platform   Walk 50 feet activity   Assist    Assist level: Minimal Assistance - Patient > 75% Assistive device: Walker-platform    Walk 150 feet  activity   Assist    Assist level: Minimal Assistance - Patient > 75% Assistive device: Walker-platform    Walk 10 feet on uneven surface  activity   Assist     Assist level: Moderate Assistance - Patient - 50 - 74% Assistive device: Hand held assist   Wheelchair     Assist Is the patient using a wheelchair?: Yes Type of Wheelchair: Manual    Wheelchair assist level: Moderate Assistance - Patient 50 - 74% Max wheelchair distance: 150    Wheelchair 50 feet with 2 turns activity    Assist        Assist Level: Moderate Assistance - Patient 50 - 74%   Wheelchair 150 feet activity     Assist      Assist Level: Maximal Assistance - Patient 25 - 49%   Blood pressure 109/75, pulse 64, temperature 97.8 F (36.6 C), temperature source Oral, resp. rate 16, height 5' 8 (1.727 m), weight 87.8 kg, SpO2 100%.  Medical Problem List and Plan: 1. Functional deficits secondary to L inferior basal ganglia stroke (posterior limb of internal capsule)             -patient may  shower             -ELOS/Goals: 12/15 min A to mod I  -Continue CIR  - 12/6: Add LLE PRAFO for contracture prevention, PF tone - has AFO--tolerating well 12-7: Mom concerned about worsening function of left lower extremity; no notable changes on exam, there is some bruising Left foot over navicular area and 1st MTP, ask orthotist to eval for Left medial foot pressure relief  Current AFO is solid, carbon fiber ~44 yrs old    12/12 awaiting custom AFO for LLE.   2.  Antithrombotics: -DVT/anticoagulation:  Pharmaceutical: Lovenox  40mg  daily-- d/c'd 12/9 -antiplatelet therapy: DAPT X 3 weeks followed by Plavix  alone-- might be approaching end date of ASA-- weekday team to assess.    3. Pain Management: Tylenol  and flexeril  prn.   4. Mood/Behavior/Sleep: LCSW to follow for evaluation and support             --ativan  6mg  at nights for sleep/nightmares             -antipsychotic agents: N/A  5.  Neuropsych/cognition: This patient is not fully capable of making decisions on his own behalf.  6. Skin/Wound Care: Routine pressure relief measures.   7. Fluids/Electrolytes/Nutrition: Monitor I/O. Routine labs. Continue Klor CR 10mEq BID  8. Panhypopituitarism: On hydrocortisone  5mg  daily, levothyroxine  100mcg daily, and DDAVP  0.2mg  TID-->followed by Dr. Faythe Currently DDAVP  is 0.1mg  QID  9. Low grade fever: resolved CXR and UA neg  10.  HTN: Monitor BP tid--on Losartan  25mg  daily  -12/12-13  BPs under reasonable control  Vitals:  10/21/24 1249 10/21/24 2025 10/22/24 0535 10/22/24 1616  BP: 129/82 118/69 106/69 127/85   10/22/24 1935 10/23/24 0550 10/23/24 1629 10/23/24 1951  BP: (!) 131/91 98/81 111/67 125/80   10/24/24 0456 10/24/24 1548 10/24/24 1941 10/25/24 0628  BP: 137/86 127/87 136/75 109/75     11.  Hypernatremia: Resolved Sodium trending back up and DDAVP  increased to 2 mg TID today, Mom prefers intranasal route for pt thinks it controls urination better , will change to home dose and monitor 0.1 QID, (sometimes used .2mg  at noc)   Recheck BMP stable 12/11-- cont nasal DDAVP     Latest Ref Rng & Units 10/23/2024    6:00 AM 10/20/2024    6:31 AM 10/16/2024    5:07 AM  BMP  Glucose 70 - 99 mg/dL 81  88  86   BUN 6 - 20 mg/dL 14  12  12    Creatinine 0.61 - 1.24 mg/dL 8.90  8.86  8.92   Sodium 135 - 145 mmol/L 139  140  139   Potassium 3.5 - 5.1 mmol/L 4.0  4.5  4.0   Chloride 98 - 111 mmol/L 103  104  105   CO2 22 - 32 mmol/L 30  27  27    Calcium  8.9 - 10.3 mg/dL 8.8  8.7  8.6    12. Leucocytosis: resolved   13.  H/o Supersella germinoma: Hx of left hemiparesis, cognitive deficits and seizures in the past (no meds)   14. Mild OSA: Unable to tolerate CPAP  15. Mild constipation- pt goes daily at home- might need to intervene if hasn't gone by tomorrow.   -received MoM 15ml and effective, on senokot S 2tabs daily - LBM 12/12- not documented  16. HLD: continue  Rosuvastatin  20mg  daily  17.  Spasticity - LUE and LLE  - Left wrist AND toe contracture vs spasticity consider botulinum toxin injection - discussed with pt and his mother, mom is in favor , pt unsure  - 12/6: Add Baclofen  5 mg TID; has PRN flexeril . Bracing as above - 12/7: Some improvement in spasms, mild improvement in elbow flexor tone.     -12/12: increase baclofen  to 10mg  bid and observe for effect. Could increase to TID this weekend if he tolerates the bid dosing.     -Consider Botulinum toxin as outpatient    LOS: 15 days A FACE TO FACE EVALUATION WAS PERFORMED  [address] 10/25/2024, 10:09 AM

## 2024-10-26 NOTE — Progress Notes (Signed)
 Physical Therapy Session Note  Patient Details  Name: Nicholas Holt MRN: [id number] Date of Birth: Sep 27, 1981  Today's Date: 10/26/2024 PT Individual Time: 1330-1410  PT Individual Time Calculation (min): 40 min  Short Term Goals: Week 2:  PT Short Term Goal 1 (Week 2): STG = LTG PT Short Term Goal 1 - Progress (Week 2): Progressing toward goal  Skilled Therapeutic Interventions/Progress Updates:  Chart reviewed and pt agreeable to therapy. Pt received seated in recliner with 0/10 c/o pain. Session focused on balance, ambulation, and cognitive tasks during functional activity to promote safe home mobility and access. Pt initiated session with amb of 236ft + 266ft using CGA + HHA + strong VC for safe speed. Pt requested toileting and returned to room to use urinal with modA of mother. Pt then completed cognition exercises in standing using BITS. Pt completed memory task with parameters of 5 items max using images. Pt was consistently able to achieve 60% accuracy and required CGA for standing balance. Pt then attempted rhythm activity on BITS to promote understanding of speed. Pt attempted activity twice at speed of 50 bpm with 16% accuracy each time. Pt then sat at [employer] and practiced pace partner activity with parameters set to 65 spm. Pt required max VC for success with pace partners that reduced to mod VC with practice. Pt attempted short amb after exercise and continued to require VC for safe amb pace. At end of session, pt was left in room in care of NT obtaining vital signs.     Therapy Documentation Precautions:  Precautions Precautions: Fall Recall of Precautions/Restrictions: Impaired Precaution/Restrictions Comments: Left AFO, h/o left hemiplegia Restrictions Weight Bearing Restrictions Per Provider Order: No General:      Therapy/Group: Individual Therapy   Knut Rondinelli G Kenwood Rosiak 10/26/2024, 3:54 PM

## 2024-10-26 NOTE — Progress Notes (Signed)
 PROGRESS NOTE   Subjective/Complaints:  Pt doing well again, slept well, denies pain, LBM last night per mom, urinating fine. No other complaints or concerns.   ROS: as per HPI. Denies CP, SOB, abd pain, N/V/D/C, or any other complaints at this time.    Objective:   No results found.  No results for input(s): WBC, HGB, HCT, PLT in the last 72 hours.        Intake/Output Summary (Last 24 hours) at 10/26/2024 1053 Last data filed at 10/26/2024 0918 Gross per 24 hour  Intake 829 ml  Output 600 ml  Net 229 ml         Physical Exam: Vital Signs Blood pressure 107/66, pulse 80, temperature 98.1 F (36.7 C), temperature source Oral, resp. rate 16, height 5' 8 (1.727 m), weight 87.8 kg, SpO2 98%.  Constitutional: No distress . Vital signs reviewed. Resting comfortably in bed. Parents at bedside  HEENT: NCAT, EOMI, oral membranes moist Neck: supple Cardiovascular: RRR without murmur. No JVD    Respiratory/Chest: CTA Bilaterally without wheezes or rales. Normal effort    GI/Abdomen: BS +, non-tender, non-distended, soft Ext: no clubbing, cyanosis, or edema Psych: pleasant and cooperative  Skin: No evidence of breakdown, no evidence of rash over exposed surfaces   PRIOR EXAMS: MSK: No tenderness to palpation throughout left lower extremity, dorsal plantar foot, or ankle squeeze.  Full passive range of motion.  No crepitus or deformity.  Neuro: Awake, alert.  Follows commands appropriately. moderately dysarthric,  Somewhat Ataxic with arms as well B/L RUE- 5/5 throughout RLE- 5/5 throughout  LUE- biceps and triceps 3-/5, FF 4/5, FA 4-/5; WE 0 contracted with increased elbow and wrist flexor tone  LLE- HF 3-/5; KE 4-/5; KF 3-/5; DF 2-/5 and PF 2-/5   Likely contracture L wrist, -5 to 10 degrees in flexion from neutral, L EF MAS 1+; flexion contractures L toes, MAS 2 L PF, MAS 1 KF  Sensation to LT  intact  Minimal limb dysmetria RLE    Assessment/Plan: 1. Functional deficits which require 3+ hours per day of interdisciplinary therapy in a comprehensive inpatient rehab setting. Physiatrist is providing close team supervision and 24 hour management of active medical problems listed below. Physiatrist and rehab team continue to assess barriers to discharge/monitor patient progress toward functional and medical goals  Care Tool:  Bathing    Body parts bathed by patient: Left arm, Chest, Abdomen, Front perineal area, Right upper leg, Left upper leg, Face, Right arm, Buttocks   Body parts bathed by helper: Right lower leg, Left lower leg     Bathing assist Assist Level: Minimal Assistance - Patient > 75%     Upper Body Dressing/Undressing Upper body dressing   What is the patient wearing?: Pull over shirt    Upper body assist Assist Level: Supervision/Verbal cueing    Lower Body Dressing/Undressing Lower body dressing      What is the patient wearing?: Underwear/pull up, Pants     Lower body assist Assist for lower body dressing: Minimal Assistance - Patient > 75%     Toileting Toileting Toileting Activity did not occur (Clothing management and hygiene only): N/A (no void  or bm)  Toileting assist Assist for toileting: Contact Guard/Touching assist     Transfers Chair/bed transfer  Transfers assist     Chair/bed transfer assist level: Contact Guard/Touching assist     Locomotion Ambulation   Ambulation assist      Assist level: Minimal Assistance - Patient > 75% (Needing max cuing to avoid obstacles in hallway due to poor safety/emergent awareness) Assistive device: Walker-platform Max distance: >200   Walk 10 feet activity   Assist     Assist level: Minimal Assistance - Patient > 75% Assistive device: Walker-platform   Walk 50 feet activity   Assist    Assist level: Minimal Assistance - Patient > 75% Assistive device: Walker-platform     Walk 150 feet activity   Assist    Assist level: Minimal Assistance - Patient > 75% Assistive device: Walker-platform    Walk 10 feet on uneven surface  activity   Assist     Assist level: Moderate Assistance - Patient - 50 - 74% Assistive device: Hand held assist   Wheelchair     Assist Is the patient using a wheelchair?: Yes Type of Wheelchair: Manual    Wheelchair assist level: Moderate Assistance - Patient 50 - 74% Max wheelchair distance: 150    Wheelchair 50 feet with 2 turns activity    Assist        Assist Level: Moderate Assistance - Patient 50 - 74%   Wheelchair 150 feet activity     Assist      Assist Level: Maximal Assistance - Patient 25 - 49%   Blood pressure 107/66, pulse 80, temperature 98.1 F (36.7 C), temperature source Oral, resp. rate 16, height 5' 8 (1.727 m), weight 87.8 kg, SpO2 98%.  Medical Problem List and Plan: 1. Functional deficits secondary to L inferior basal ganglia stroke (posterior limb of internal capsule)             -patient may  shower             -ELOS/Goals: 12/15 min A to mod I  -Continue CIR  - 12/6: Add LLE PRAFO for contracture prevention, PF tone - has AFO--tolerating well 12-7: Mom concerned about worsening function of left lower extremity; no notable changes on exam, there is some bruising Left foot over navicular area and 1st MTP, ask orthotist to eval for Left medial foot pressure relief  Current AFO is solid, carbon fiber ~44 yrs old    12/12 awaiting custom AFO for LLE.   2.  Antithrombotics: -DVT/anticoagulation:  Pharmaceutical: Lovenox  40mg  daily-- d/c'd 12/9 -antiplatelet therapy: DAPT X 3 weeks followed by Plavix  alone-- might be approaching end date of ASA this week?-- weekday team to assess.    3. Pain Management: Tylenol  and flexeril  prn.   4. Mood/Behavior/Sleep: LCSW to follow for evaluation and support             --ativan  6mg  at nights for sleep/nightmares              -antipsychotic agents: N/A  5. Neuropsych/cognition: This patient is not fully capable of making decisions on his own behalf.  6. Skin/Wound Care: Routine pressure relief measures.   7. Fluids/Electrolytes/Nutrition: Monitor I/O. Routine labs. Continue Klor CR 10mEq BID  8. Panhypopituitarism: On hydrocortisone  5mg  daily, levothyroxine  100mcg daily, and DDAVP  0.2mg  TID-->followed by Dr. Faythe -Currently DDAVP  is 0.1mg  QID  9. Low grade fever: resolved CXR and UA neg  10.  HTN: Monitor BP tid--on Losartan  25mg  daily  -12/12-14  BPs under reasonable control  Vitals:   10/22/24 1616 10/22/24 1935 10/23/24 0550 10/23/24 1629  BP: 127/85 (!) 131/91 98/81 111/67   10/23/24 1951 10/24/24 0456 10/24/24 1548 10/24/24 1941  BP: 125/80 137/86 127/87 136/75   10/25/24 0628 10/25/24 1331 10/25/24 1915 10/26/24 0620  BP: 109/75 124/76 (!) 141/84 107/66     11.  Hypernatremia: Resolved Sodium trending back up and DDAVP  increased to 2 mg TID today, Mom prefers intranasal route for pt thinks it controls urination better , will change to home dose and monitor 0.1 QID, (sometimes used .2mg  at noc)   Recheck BMP stable 12/11-- cont nasal DDAVP     Latest Ref Rng & Units 10/23/2024    6:00 AM 10/20/2024    6:31 AM 10/16/2024    5:07 AM  BMP  Glucose 70 - 99 mg/dL 81  88  86   BUN 6 - 20 mg/dL 14  12  12    Creatinine 0.61 - 1.24 mg/dL 8.90  8.86  8.92   Sodium 135 - 145 mmol/L 139  140  139   Potassium 3.5 - 5.1 mmol/L 4.0  4.5  4.0   Chloride 98 - 111 mmol/L 103  104  105   CO2 22 - 32 mmol/L 30  27  27    Calcium  8.9 - 10.3 mg/dL 8.8  8.7  8.6    12. Leucocytosis: resolved   13.  H/o Supersella germinoma: Hx of left hemiparesis, cognitive deficits and seizures in the past (no meds)   14. Mild OSA: Unable to tolerate CPAP  15. Mild constipation- pt goes daily at home- might need to intervene if hasn't gone by tomorrow.   -received MoM 15ml and effective, on senokot S 2tabs daily - LBM 12/13-  not documented  16. HLD: continue Rosuvastatin  20mg  daily  17.  Spasticity - LUE and LLE  - Left wrist AND toe contracture vs spasticity consider botulinum toxin injection - discussed with pt and his mother, mom is in favor , pt unsure  - 12/6: Add Baclofen  5 mg TID; has PRN flexeril . Bracing as above - 12/7: Some improvement in spasms, mild improvement in elbow flexor tone.     -12/12: increase baclofen  to 10mg  bid and observe for effect. Could increase to TID this weekend if he tolerates the bid dosing.     -Consider Botulinum toxin as outpatient    LOS: 16 days A FACE TO FACE EVALUATION WAS PERFORMED  [address] 10/26/2024, 10:53 AM

## 2024-10-27 LAB — BASIC METABOLIC PANEL WITH GFR
Anion gap: 8 (ref 5–15)
BUN: 13 mg/dL (ref 6–20)
CO2: 26 mmol/L (ref 22–32)
Calcium: 8.6 mg/dL — ABNORMAL LOW (ref 8.9–10.3)
Chloride: 106 mmol/L (ref 98–111)
Creatinine, Ser: 1.12 mg/dL (ref 0.61–1.24)
GFR, Estimated: >60 mL/min (ref >60)
Glucose, Bld: 86 mg/dL (ref 70–99)
Potassium: 3.9 mmol/L (ref 3.5–5.1)
Sodium: 140 mmol/L (ref 135–145)

## 2024-10-27 NOTE — Progress Notes (Signed)
 Occupational Therapy Session Note  Patient Details  Name: Nicholas Holt MRN: [id number] Date of Birth: 1981-10-17  Today's Date: 10/27/2024 OT Individual Time: 8694-8654 OT Individual Time Calculation (min): 40 min    Short Term Goals: Week 3:  OT Short Term Goal 1 (Week 3): STGs = upgraded LTGs  Skilled Therapeutic Interventions/Progress Updates:  Pt greeted seated in recliner, pt agreeable to OT intervention.      Transfers/bed mobility/functional mobility:  Pt completed stand pivot transfer from recliner>w/c with MIN HHA on R side.  Pt completed functional ambulation from gym>room with MIN HHA on R side. MIN cues needed for speed and cadence.   Therapeutic activity:  Pt completed various therapeutic activities focused on short distance functional ambulation, endurance, and dynamic standing balance. Pt first instructed to complete short distance ambulation ~ 5 ft with no AD to transport horseshoes from one area to another with a focus on completing tight turns in home environment. Pt completed task with MIN HHA.   Pt then instructed to step side/side to retrieve horseshoes and toss to taget with a focus on improved lateral weight shifts as precursor to higher level balance tasks. Pt completed task with CGA with no LOB.   Pt completed functional endurance task with pt instructed to stand from EOM to then reach for bean bags and toss to target with a focus on improved standing balance and functional reaching. Pt completed task with CGA but needed MIN verbal cues to not lean on mat table.    Exercises: pt completed below standing therex to facilitate improved standing balance and to promote improved LB strengthening for LB ADLs:  X20 step ups to 1 inch step with BUE support X20 standing marches with BUE support Lateral steps to R and L side with BUE support with level 3 theraband positioned around quads. Significantly smaller steps noted when stepping to L side.                    Ended session with pt seated in recliner with all needs within reach and pts parents present.         Therapy Documentation Precautions:  Precautions Precautions: Fall Recall of Precautions/Restrictions: Impaired Precaution/Restrictions Comments: Left AFO, h/o left hemiplegia Restrictions Weight Bearing Restrictions Per Provider Order: No  Pain: No pain    Therapy/Group: Individual Therapy  [name] 10/27/2024, 3:14 PM

## 2024-10-27 NOTE — Progress Notes (Signed)
 PROGRESS NOTE   Subjective/Complaints:    ROS: as per HPI. Denies CP, SOB, abd pain, N/V/D/C, or any other complaints at this time.    Objective:   No results found.  No results for input(s): WBC, HGB, HCT, PLT in the last 72 hours.        Intake/Output Summary (Last 24 hours) at 10/27/2024 0809 Last data filed at 10/27/2024 0715 Gross per 24 hour  Intake 473 ml  Output --  Net 473 ml         Physical Exam: Vital Signs Blood pressure 113/82, pulse 80, temperature 98 F (36.7 C), temperature source Oral, resp. rate 16, height 5' 8 (1.727 m), weight 87.8 kg, SpO2 99%.    General: No acute distress Mood and affect are appropriate Heart: Regular rate and rhythm no rubs murmurs or extra sounds Lungs: Clear to auscultation, breathing unlabored, no rales or wheezes Abdomen: Positive bowel sounds, soft nontender to palpation, nondistended Extremities: No clubbing, cyanosis, or edema Skin: No evidence of breakdown, no evidence of rash  MSK: No tenderness to palpation throughout left lower extremity, dorsal plantar foot, or ankle squeeze.  Full passive range of motion.  No crepitus or deformity.  Neuro: Awake, alert.  Follows commands appropriately. moderately dysarthric,  Somewhat Ataxic with arms as well B/L RUE- 5/5 throughout RLE- 5/5 throughout  LUE- biceps and triceps 3-/5, FF 4/5, FA 4-/5; WE 0 contracted with increased elbow and wrist flexor tone  LLE- HF 3-/5; KE 4-/5; KF 3-/5; DF 2-/5 and PF 2-/5   Likely contracture L wrist, -5 to 10 degrees in flexion from neutral, L EF MAS 1+; flexion contractures L toes, MAS 2 L PF, MAS 1 KF  Sensation to LT intact  Minimal limb dysmetria RLE    Assessment/Plan: 1. Functional deficits which require 3+ hours per day of interdisciplinary therapy in a comprehensive inpatient rehab setting. Physiatrist is providing close team supervision and 24 hour  management of active medical problems listed below. Physiatrist and rehab team continue to assess barriers to discharge/monitor patient progress toward functional and medical goals  Care Tool:  Bathing    Body parts bathed by patient: Left arm, Chest, Abdomen, Front perineal area, Right upper leg, Left upper leg, Face, Right arm, Buttocks   Body parts bathed by helper: Right lower leg, Left lower leg     Bathing assist Assist Level: Minimal Assistance - Patient > 75%     Upper Body Dressing/Undressing Upper body dressing   What is the patient wearing?: Pull over shirt    Upper body assist Assist Level: Supervision/Verbal cueing    Lower Body Dressing/Undressing Lower body dressing      What is the patient wearing?: Underwear/pull up, Pants     Lower body assist Assist for lower body dressing: Minimal Assistance - Patient > 75%     Toileting Toileting Toileting Activity did not occur (Clothing management and hygiene only): N/A (no void or bm)  Toileting assist Assist for toileting: Contact Guard/Touching assist     Transfers Chair/bed transfer  Transfers assist     Chair/bed transfer assist level: Contact Guard/Touching assist     Locomotion Ambulation   Ambulation  assist      Assist level: Minimal Assistance - Patient > 75% (Needing max cuing to avoid obstacles in hallway due to poor safety/emergent awareness) Assistive device: Walker-platform Max distance: >200   Walk 10 feet activity   Assist     Assist level: Minimal Assistance - Patient > 75% Assistive device: Walker-platform   Walk 50 feet activity   Assist    Assist level: Minimal Assistance - Patient > 75% Assistive device: Walker-platform    Walk 150 feet activity   Assist    Assist level: Minimal Assistance - Patient > 75% Assistive device: Walker-platform    Walk 10 feet on uneven surface  activity   Assist     Assist level: Moderate Assistance - Patient - 50 -  74% Assistive device: Hand held assist   Wheelchair     Assist Is the patient using a wheelchair?: Yes Type of Wheelchair: Manual    Wheelchair assist level: Moderate Assistance - Patient 50 - 74% Max wheelchair distance: 150    Wheelchair 50 feet with 2 turns activity    Assist        Assist Level: Moderate Assistance - Patient 50 - 74%   Wheelchair 150 feet activity     Assist      Assist Level: Maximal Assistance - Patient 25 - 49%   Blood pressure 113/82, pulse 80, temperature 98 F (36.7 C), temperature source Oral, resp. rate 16, height 5' 8 (1.727 m), weight 87.8 kg, SpO2 99%.  Medical Problem List and Plan: 1. Functional deficits secondary to L inferior basal ganglia stroke (posterior limb of internal capsule)             -patient may  shower             -ELOS/Goals: 12/19 min A to mod I  -Continue CIR  - 12/6: Add LLE PRAFO for contracture prevention, PF tone - has AFO--tolerating well 12-7: Mom concerned about worsening function of left lower extremity; no notable changes on exam, there is some bruising Left foot over navicular area and 1st MTP, ask orthotist to eval for Left medial foot pressure relief  Current AFO is solid, carbon fiber ~44 yrs old    12/12 awaiting custom AFO for LLE.   2.  Antithrombotics: -DVT/anticoagulation: Amb 200' no need for prophyllaxis -antiplatelet therapy: DAPT X 3 weeks followed by Plavix  alone-- 6 more days of DAPT  3. Pain Management: Tylenol  and flexeril  prn.   4. Mood/Behavior/Sleep: LCSW to follow for evaluation and support             --ativan  6mg  at nights for sleep/nightmares             -antipsychotic agents: N/A  5. Neuropsych/cognition: This patient is not fully capable of making decisions on his own behalf.  6. Skin/Wound Care: Routine pressure relief measures.   7. Fluids/Electrolytes/Nutrition: Monitor I/O. Routine labs. Continue Klor CR 10mEq BID  8. Panhypopituitarism: On hydrocortisone  5mg   daily, levothyroxine  100mcg daily, and DDAVP  0.2mg  TID-->followed by Dr. Faythe -Currently DDAVP  is 0.1mg  QID  9. Low grade fever: resolved CXR and UA neg  10.  HTN: Monitor BP tid--on Losartan  25mg  daily  -12/15 very good control   Vitals:   10/23/24 1629 10/23/24 1951 10/24/24 0456 10/24/24 1548  BP: 111/67 125/80 137/86 127/87   10/24/24 1941 10/25/24 0628 10/25/24 1331 10/25/24 1915  BP: 136/75 109/75 124/76 (!) 141/84   10/26/24 0620 10/26/24 1421 10/26/24 1943 10/27/24 0614  BP: 107/66  130/81 124/71 113/82     11.  Hypernatremia: Resolved Sodium trending back up and DDAVP  increased to 2 mg TID today, Mom prefers intranasal route for pt thinks it controls urination better , will change to home dose and monitor 0.1 QID, (sometimes used .2mg  at noc)   Recheck BMP stable 12/15-- cont nasal DDAVP     Latest Ref Rng & Units 10/27/2024    6:38 AM 10/23/2024    6:00 AM 10/20/2024    6:31 AM  BMP  Glucose 70 - 99 mg/dL 86  81  88   BUN 6 - 20 mg/dL 13  14  12    Creatinine 0.61 - 1.24 mg/dL 8.87  8.90  8.86   Sodium 135 - 145 mmol/L 140  139  140   Potassium 3.5 - 5.1 mmol/L 3.9  4.0  4.5   Chloride 98 - 111 mmol/L 106  103  104   CO2 22 - 32 mmol/L 26  30  27    Calcium  8.9 - 10.3 mg/dL 8.6  8.8  8.7    12. Leucocytosis: resolved   13.  H/o Supersella germinoma: Hx of left hemiparesis, cognitive deficits and seizures in the past (no meds)   14. Mild OSA: Unable to tolerate CPAP  15. Mild constipation- pt goes daily at home- might need to intervene if hasn't gone by tomorrow.   -received MoM 15ml and effective, on senokot S 2tabs daily - LBM 12/13- not documented  16. HLD: continue Rosuvastatin  20mg  daily  17.  Spasticity - LUE and LLE  - Left wrist AND toe contracture vs spasticity consider botulinum toxin injection - discussed with pt and his mother, mom is in favor , pt unsure  - 12/6: Add Baclofen  5 mg TID; has PRN flexeril . Bracing as above - 12/7: Some improvement in  spasms, mild improvement in elbow flexor tone.     -12/12: increase baclofen  to 10mg  bid and observe for effect. Could increase to TID this weekend if he tolerates the bid dosing.     -Consider Botulinum toxin as outpatient    LOS: 17 days A FACE TO FACE EVALUATION WAS PERFORMED  Prentice FORBES Compton 10/27/2024, 8:09 AM

## 2024-10-27 NOTE — Progress Notes (Signed)
 Physical Therapy Session Note  Patient Details  Name: Nicholas Holt MRN: [id number] Date of Birth: [birth year]  Today's Date: 10/27/2024   Short Term Goals: Week 3:  PT Short Term Goal 1 (Week 3): = LTGs due ELOS (extended)  Skilled Therapeutic Interventions/Progress Updates:    Session #1: PT Individual Time: 1129 - 1155  Time Calculation: 26 min Denies pain. Session focused on NMR to address postural control retraining, dynamic standing balance, dynamic gait, retro gait, and coordination/strengthening of LLE. Pt performed basic transfers throughout session with overall CGA -occasional mild posterior lean with standing initially but pt able to correct with tactile and verbal cues. Several bouts of gait throughout session without device with focus on balance, attention to obstacles and body awareness as well as focus on L heel strike and LLE clearance up to 150'. In parallel bars focused on toe taps to 4 step with visual target to work on control and coordination, then stance on LLE x 15 reps each - required RUE support to complete. Progressed to same activity with cone to increase challenge of control and maintain position of cone (knocked over ~ 5 reps). Seated rest breaks needed between trials due to pt reported fatigue. Focused on NMR for retro gait and increasing challenge - strong posterior lean noted and up to mod A needed to correct - transitioned to parallel bars and rail in hallway for RUE support with mirror for visual feedback in bars, some improvement noted with repetition but then minimal carryover to correct once attempted acitivty again in hallway. Returned back to room via gait with focus on the above. Left in care of parents and all needs in reach.       Session #2: PT Individual Time: 8640-8542 Time Calculation: 58 min  Denies pain. Session focused on NMR to address postural control retraining, dynamic standing balance, dynamic gait, and core strengthening. Pt  performs gait without device with CGA  overall initially but by end of session due to fatigue and rushing to get back to room to use bathroom (difficulty fully communicating this prior to arrival to room) required closer to min A - focused on L heel strike and foot clearance and overall balance. Dynamic balance with dual task training and transitional movements for sit <> stands to grab colored bean bag and toss it at matching color - required multimodal cues to slow down to pay attention to instructions and take his time to avoid mistakes (threw about 5 mismatched colors) as well as follow sequence in instructions (sit to stand each time, reach for bean bag in standing not sitting). Performed functional reaching to pick items off of floor with overall min A for balance and cues for safe foot placement and stance. Core strengthening with weighted medicine ball with modified wedge x 10 reps for crunches and overhead reach with incorporation with LUE for bimanual task x 2 sets with rest break between sets. Gait to day room with focus on above and engaged in dynamic standing balance and sit <> stand activity to play Wii bowling - cues for technique with use of remote and min to light mod A at times needed for balance due to occasional posterior lean with sit > stands. Returned back to room as described above and performed toileting in standing with CGA and functional ambulation in room with CGA to wash hands at sink and return to recliner. Left set up in room with family at bedside and all needs in reach.   Therapy Documentation  Precautions:  Precautions Precautions: Fall Recall of Precautions/Restrictions: Impaired Precaution/Restrictions Comments: Left AFO, h/o left hemiplegia Restrictions Weight Bearing Restrictions Per Provider Order: No     Therapy/Group: Individual Therapy  Elnor Pizza Sherrell Pizza WENDI Elnor, PT, DPT, CBIS  10/27/2024, 11:58 AM

## 2024-10-27 NOTE — Progress Notes (Signed)
 Speech Language Pathology Weekly Progress and Session Note  Patient Details  Name: Nicholas Holt MRN: [id number] Date of Birth: August 15, 1981  Beginning of progress report period: October 20, 2024 End of progress report period: October 27, 2024  Today's Date: 10/27/2024 SLP Individual Time: 8996-8897 SLP Individual Time Calculation (min): 59 min  Short Term Goals: Week 2: SLP Short Term Goal 1 (Week 2): STG = LTG due to ELOS    New Short Term Goals: Week 3: SLP Short Term Goal 1 (Week 3): STGs = LTGs due to ELOS  Weekly Progress Updates: Pt continues to make slow but steady progress toward long term goals. Goals remain the same this reporting due to extension of length of stay. Pt continues to make progress toward speech intelligibility with use of pacing board. Pt/ family education ongoing. Pt would benefit from continued skilled SLP intervention to maximize cognition and speech intelligibility in order to maximize his functional independence prior to discharge   Intensity: Minumum of 1-2 x/day, 30 to 90 minutes Frequency: 3 to 5 out of 7 days Duration/Length of Stay: 12/15 Treatment/Interventions: Cognitive remediation/compensation;Environmental controls;Cueing hierarchy;Functional tasks;Therapeutic Activities;Internal/external aids;Patient/family education;Speech/Language facilitation   Daily Session  Skilled Therapeutic Interventions: Patient was seen in am to address speech intelligibility and cognitive training. Pt alert and seated upright in recliner upon SLP arrival. Both parents were present. Pt was agreeable to session. Plan to complete session outside of room thus SLP assisting in transferring pt to [place]. Pt noted to be impulsive though able to be redirected with minimal cues. SLP assessing pt orientation through awareness of environment. Pt indep oriented to place and year. He was oriented to month given min cues to observe surroundings. He oriented to day of week with  external aid. Pt recalled compensatory strategies of slow rate for speech intelligibility though warranting mod A to recall remaining strategies. Pacing board re-introduced with minimal cues needed for use. SLP engaged pt in interview style questions regarding biographical and personal questions. Pt achieved 90% intelligibility at phrase level given min A. In other minutes of session, SLP engaged pt in a structured task of communicating a simple sentence level. Pt warranting mod to max A to achieve 65-75% intelligibility. SLP addressing problem solving. Pt identified current safety precautions (not standing indep, brakes on WC, etc) with min A. At conclusion of session, pt returned to his room where family was present. SLP to continue POC.    General    Pain Pain Assessment Pain Scale: 0-10 Pain Score: 0-No pain  Therapy/Group: Individual Therapy  Joane GORMAN Fuss 10/27/2024, 12:31 PM

## 2024-10-28 NOTE — Progress Notes (Signed)
 Occupational Therapy Session Note  Patient Details  Name: Nicholas Holt MRN: [id number] Date of Birth: 05-29-1981  Today's Date: 10/28/2024 OT Individual Time: 8498-8468 OT Individual Time Calculation (min): 30 min    Short Term Goals: Week 3:  OT Short Term Goal 1 (Week 3): STGs = upgraded LTGs  Skilled Therapeutic Interventions/Progress Updates:  Skilled OT session completed to address functional transfers and core strengthening in support of safety with transfers. Pt received seated in recliner with parents present, agreeable to participate in therapy. Pt reports no pain.  Recliner>WC stand pivot with close supervision. Pt self-propels WC; however, with encouragement willing to perform functional mobility ambulating without AD ~16ft with close supervision. Pt seated EOM completed 5x STS with BUE support CGA. Pt displaying posterior lean d/t retropulsion when sitting VC provided to reduce lean and control sitting. Task upgraded to remove BUE support 5x. Pt displays 2x LOB d/t impulsivity when attempting to stand, OT provides VC to only stand when feet are flat on the floor. Pt displayed understanding and continued task. Pt completed the following UB exs to increase core strength in support of functional transfers and dynamic standing balance:  -3x10 sit ups EOM with 5lb weight -3x5 russian twists with 5lb weight requiring tactile cues with cones laterally for technique -3x10 squats with 5lb weight Pt required 2 seated rest breaks during activity.   Pt completed functional mobility without AD with close supervision ~125ft back to room. Seated in recliner with parents present and all needs within reach.  Therapy Documentation Precautions:  Precautions Precautions: Fall Recall of Precautions/Restrictions: Impaired Precaution/Restrictions Comments: Left AFO, h/o left hemiplegia Restrictions Weight Bearing Restrictions Per Provider Order: No   Therapy/Group: Individual  Therapy  Bucky Grigg [place], MS, OTR/L 10/28/2024, 8:00 AM

## 2024-10-28 NOTE — Plan of Care (Signed)
  Problem: Consults Goal: RH STROKE PATIENT EDUCATION Description: See Patient Education module for education specifics  Outcome: Progressing   Problem: RH BOWEL ELIMINATION Goal: RH STG MANAGE BOWEL WITH ASSISTANCE Description: STG Manage Bowel with mod I  Assistance. Outcome: Progressing Goal: RH STG MANAGE BOWEL W/MEDICATION W/ASSISTANCE Description: STG Manage Bowel with Medication with mod I  Assistance. Outcome: Progressing   Problem: RH SAFETY Goal: RH STG ADHERE TO SAFETY PRECAUTIONS W/ASSISTANCE/DEVICE Description: STG Adhere to Safety Precautions With cues Assistance/Device. Outcome: Progressing

## 2024-10-28 NOTE — Progress Notes (Signed)
 Occupational Therapy Session Note  Patient Details  Name: Nicholas Holt MRN: [id number] Date of Birth: [birth year]  Today's Date: 10/28/2024 OT Individual Time: 0950-1030 OT Individual Time Calculation (min): 40 min    Short Term Goals: Week 3:  OT Short Term Goal 1 (Week 3): STGs = upgraded LTGs  Skilled Therapeutic Interventions/Progress Updates:  Pt greeted sitting in [facility] for skilled OT session with focus on dynamic standing/sitting balance and general conditioning.   Pain: Pt with no reports of pain. OT offering intermediate rest breaks and positioning suggestions throughout session to address pain/fatigue and maximize participation/safety in session.   Functional Transfers: Stand-pivots with CGA + no AD, verbal cues for sequencing (attention to LLE placement).   Self Care Tasks: No needs voiced this session.   Therapeutic Activities: Pt instructed in 3 x 1 min cycles of standing alternating punches onto [employer]  punching bag. Min A initially provided with verbal cues for anterior weight-shift onto B-toes. Standing balance improving with reps, as little as close supervision-CGA provided. Activity modified to challenge sitting balance (as patient sits in posterior bias when unsupported) using stability disk. Pt reaches multi-directionally at varying heights to punch pads, supervision with tasks, intermediately using UE to support balance.   Therapeutic Exercise: Pt instructed in ~5 mins of SciFit modality for BUE strengthening/endurance, resistance interval from level 2-5.   Pt remained sitting in WC with 4Ps assessed and immediate needs met. Pt continues to be appropriate for skilled OT intervention to promote further functional independence in ADLs/IADLs.   Therapy Documentation Precautions:  Precautions Precautions: Fall Recall of Precautions/Restrictions: Impaired Precaution/Restrictions Comments: Left AFO, h/o left hemiplegia Restrictions Weight Bearing Restrictions Per  Provider Order: No   Therapy/Group: Individual Therapy  Nereida Habermann, OTR/L, MSOT  10/28/2024, 6:52 AM

## 2024-10-28 NOTE — Progress Notes (Signed)
 Physical Therapy Session Note  Patient Details  Name: Nicholas Holt MRN: [id number] Date of Birth: 1981-08-21  Today's Date: 10/28/2024 PT Individual Time: 1301-1359 PT Individual Time Calculation (min): 58 min   Short Term Goals: Week 2:  PT Short Term Goal 1 (Week 2): STG = LTG PT Short Term Goal 1 - Progress (Week 2): Progressing toward goal Week 3:  PT Short Term Goal 1 (Week 3): = LTGs due ELOS (extended)  Skilled Therapeutic Interventions/Progress Updates:    Close S/CGA for transfer from recliner to w/c with stand step transfer and pt's mother providing assist.   BITS System for NMR and dual task cognition and balance retraining: Memory program with 3 images (verbal and then images) for 3 min in standing. Complete 9 trials = 75%. CGA for balance Bell Cancellation Test (missed 1, 1 distractor) - 5 min, 14 sec - request to stop stating he was done - completed again in standing Trailmaking Part A - error x 1, interruptions x 2, 52 sec total   Nustep x 10 min on level 15 for paced partner with target set for SPM = 90 with BUE and BLE with focus on maintaining pace, reciprocal movement pattern retraining and overall strengthening and endurance.  Gait with focus on L heel strike, LLE step length, and overall balance in distracting environment x 150' with overall CGA, increased gait speed and cues for safety.  NMR on mat table in supine for bridging, then bridging with therapy ball between knees to activate adduction, and sit ups x 10-15 reps each with cues for continued participation and technique. Pt requesting at times to head back to room - redirected to amount of time still left in therapy session.   End of session returned back to room and transfer back to recliner as described above.       Therapy Documentation Precautions:  Precautions Precautions: Fall Recall of Precautions/Restrictions: Impaired Precaution/Restrictions Comments: Left AFO, h/o left  hemiplegia Restrictions Weight Bearing Restrictions Per Provider Order: No  Pain: Denies pain.     Therapy/Group: Individual Therapy  Elnor Pizza Sherrell Pizza WENDI Elnor, PT, DPT, CBIS  10/28/2024, 2:06 PM

## 2024-10-28 NOTE — Progress Notes (Signed)
 Physical Therapy Session Note  Patient Details  Name: Nicholas Holt MRN: [id number] Date of Birth: 10-Jul-1981  Today's Date: 10/28/2024 PT Individual Time: 1137-1202 PT Individual Time Calculation (min): 25 min   Short Term Goals: Week 3:  PT Short Term Goal 1 (Week 3): = LTGs due ELOS (extended)  Skilled Therapeutic Interventions/Progress Updates:    Pt presents in recliner with mother present. Performed almost close S to light CGA stand step transfer to w.c. Transport to and from session for time management. Performed simulated car transfer to SUV height for preparation for d/c with cues for safe technique wit overall CGA. Extra time needed to manage LLE in and out of car. Ramp and mulch negotiation to simulate community environment mobility with overall CGA and intermittent use of rail. Curb step negotiation with rail with CGA overall and cues for which foot to lead with. Pt performed NMR for dynamic gait and balance through obstacle course x 2 trials about 30' with overall CGA and cues for increased step length on L. Pelvic dissociation exercises with therapy ball and reaching anteriorly in seated position and then with therapy ball outstretched x 5 each activity with cues for tecnique - decreased pelvic mobility noted. Carryover for sit > Stands with focus on balance and postural control x 5 reps with cues for facilitation of anterior weightshift and transitional movement. Returned to room at end of session and performed transfer with mother back to recliner. All needs in reach.   Therapy Documentation Precautions:  Precautions Precautions: Fall Recall of Precautions/Restrictions: Impaired Precaution/Restrictions Comments: Left AFO, h/o left hemiplegia Restrictions Weight Bearing Restrictions Per Provider Order: No    Pain:  Denies pain.    Therapy/Group: Individual Therapy  Elnor Pizza Sherrell Pizza WENDI Elnor, PT, DPT, CBIS  10/28/2024, 12:24 PM

## 2024-10-28 NOTE — Progress Notes (Signed)
 PROGRESS NOTE   Subjective/Complaints:  Discussed inpt vs OP therapy, mom prefers outpt Pt eating well, bladder control improved Last recorded BM 12/10?  ROS: as per HPI. Denies CP, SOB, abd pain, N/V/D/C, or any other complaints at this time.    Objective:   No results found.  No results for input(s): WBC, HGB, HCT, PLT in the last 72 hours.        Intake/Output Summary (Last 24 hours) at 10/28/2024 0735 Last data filed at 10/27/2024 1806 Gross per 24 hour  Intake 240 ml  Output --  Net 240 ml         Physical Exam: Vital Signs Blood pressure 114/72, pulse 84, temperature 98.2 F (36.8 C), resp. rate 18, height 5' 8 (1.727 m), weight 87.8 kg, SpO2 100%.    General: No acute distress Mood and affect are appropriate Heart: Regular rate and rhythm no rubs murmurs or extra sounds Lungs: Clear to auscultation, breathing unlabored, no rales or wheezes Abdomen: Positive bowel sounds, soft nontender to palpation, nondistended Extremities: No clubbing, cyanosis, or edema Skin: No evidence of breakdown, no evidence of rash  MSK: No tenderness to palpation throughout left lower extremity, dorsal plantar foot, or ankle squeeze.  Full passive range of motion.  No crepitus or deformity.  Neuro: Awake, alert.  Follows commands appropriately. moderately dysarthric,  Somewhat Ataxic with arms as well B/L RUE- 5/5 throughout RLE- 5/5 throughout  LUE- biceps and triceps 3-/5, FF 4/5, FA 4-/5; WE 0 contracted with increased elbow and wrist flexor tone  LLE- HF 3-/5; KE 4-/5; KF 3-/5; DF 2-/5 and PF 2-/5   Likely contracture L wrist, -5 to 10 degrees in flexion from neutral, L EF MAS 1+; flexion contractures L toes, MAS 2 L PF, MAS 1 KF  Sensation to LT intact  Minimal limb dysmetria RLE    Assessment/Plan: 1. Functional deficits which require 3+ hours per day of interdisciplinary therapy in a  comprehensive inpatient rehab setting. Physiatrist is providing close team supervision and 24 hour management of active medical problems listed below. Physiatrist and rehab team continue to assess barriers to discharge/monitor patient progress toward functional and medical goals  Care Tool:  Bathing    Body parts bathed by patient: Left arm, Chest, Abdomen, Front perineal area, Right upper leg, Left upper leg, Face, Right arm, Buttocks   Body parts bathed by helper: Right lower leg, Left lower leg     Bathing assist Assist Level: Minimal Assistance - Patient > 75%     Upper Body Dressing/Undressing Upper body dressing   What is the patient wearing?: Pull over shirt    Upper body assist Assist Level: Supervision/Verbal cueing    Lower Body Dressing/Undressing Lower body dressing      What is the patient wearing?: Underwear/pull up, Pants     Lower body assist Assist for lower body dressing: Minimal Assistance - Patient > 75%     Toileting Toileting Toileting Activity did not occur (Clothing management and hygiene only): N/A (no void or bm)  Toileting assist Assist for toileting: Contact Guard/Touching assist     Transfers Chair/bed transfer  Transfers assist     Chair/bed transfer  assist level: Contact Guard/Touching assist     Locomotion Ambulation   Ambulation assist      Assist level: Contact Guard/Touching assist Assistive device: No Device Max distance: 150'   Walk 10 feet activity   Assist     Assist level: Contact Guard/Touching assist Assistive device: No Device   Walk 50 feet activity   Assist    Assist level: Contact Guard/Touching assist Assistive device: No Device    Walk 150 feet activity   Assist    Assist level: Contact Guard/Touching assist Assistive device: No Device    Walk 10 feet on uneven surface  activity   Assist     Assist level: Moderate Assistance - Patient - 50 - 74% Assistive device: Hand held  assist   Wheelchair     Assist Is the patient using a wheelchair?: Yes Type of Wheelchair: Manual    Wheelchair assist level: Moderate Assistance - Patient 50 - 74% Max wheelchair distance: 150    Wheelchair 50 feet with 2 turns activity    Assist        Assist Level: Moderate Assistance - Patient 50 - 74%   Wheelchair 150 feet activity     Assist      Assist Level: Maximal Assistance - Patient 25 - 49%   Blood pressure 114/72, pulse 84, temperature 98.2 F (36.8 C), resp. rate 18, height 5' 8 (1.727 m), weight 87.8 kg, SpO2 100%.  Medical Problem List and Plan: 1. Functional deficits secondary to L inferior basal ganglia stroke (posterior limb of internal capsule)             -patient may  shower             -ELOS/Goals: 12/19 min A to mod I  -Continue CIR Team conf in am   2.  Antithrombotics: -DVT/anticoagulation: Amb 200' no need for prophyllaxis -antiplatelet therapy: DAPT X 3 weeks followed by Plavix  alone-- 6 more days of DAPT  3. Pain Management: Tylenol  and flexeril  prn.   4. Mood/Behavior/Sleep: LCSW to follow for evaluation and support             --ativan  6mg  at nights for sleep/nightmares             -antipsychotic agents: N/A  5. Neuropsych/cognition: This patient is not fully capable of making decisions on his own behalf.  6. Skin/Wound Care: Routine pressure relief measures.   7. Fluids/Electrolytes/Nutrition: Monitor I/O. Routine labs. Continue Klor CR 10mEq BID  8. Panhypopituitarism: On hydrocortisone  5mg  daily, levothyroxine  100mcg daily, and DDAVP  0.2mg  TID-->followed by Dr. Faythe -Currently DDAVP  is 0.1mg  QID  9. Low grade fever: resolved CXR and UA neg  10.  HTN: Monitor BP tid--on Losartan  25mg  daily  -12/15 very good control   Vitals:   10/24/24 1548 10/24/24 1941 10/25/24 0628 10/25/24 1331  BP: 127/87 136/75 109/75 124/76   10/25/24 1915 10/26/24 0620 10/26/24 1421 10/26/24 1943  BP: (!) 141/84 107/66 130/81 124/71    10/27/24 0614 10/27/24 1354 10/27/24 1949 10/28/24 0526  BP: 113/82 136/81 132/85 114/72     11.  Hypernatremia: Resolved Sodium trending back up and DDAVP  increased to 2 mg TID today, Mom prefers intranasal route for pt thinks it controls urination better , will change to home dose and monitor 0.1 QID, (sometimes used .2mg  at noc)   Recheck BMP stable 12/15-- cont nasal DDAVP     Latest Ref Rng & Units 10/27/2024    6:38 AM 10/23/2024    6:00  AM 10/20/2024    6:31 AM  BMP  Glucose 70 - 99 mg/dL 86  81  88   BUN 6 - 20 mg/dL 13  14  12    Creatinine 0.61 - 1.24 mg/dL 8.87  8.90  8.86   Sodium 135 - 145 mmol/L 140  139  140   Potassium 3.5 - 5.1 mmol/L 3.9  4.0  4.5   Chloride 98 - 111 mmol/L 106  103  104   CO2 22 - 32 mmol/L 26  30  27    Calcium  8.9 - 10.3 mg/dL 8.6  8.8  8.7    12. Leucocytosis: resolved   13.  H/o Supersella germinoma: Hx of left hemiparesis, cognitive deficits and seizures in the past (no meds)   14. Mild OSA: Unable to tolerate CPAP  15. Mild constipation- pt goes daily at home- might need to intervene if hasn't gone by tomorrow.   -received MoM 15ml and effective, on senokot S 2tabs daily - LBM 12/13- not documented  16. HLD: continue Rosuvastatin  20mg  daily  17.  Spasticity - LUE and LLE  - Left wrist AND toe contracture vs spasticity consider botulinum toxin injection - discussed with pt and his mother, mom is in favor , pt unsure  - 12/6: Add Baclofen  5 mg TID; has PRN flexeril . Bracing as above - 12/7: Some improvement in spasms, mild improvement in elbow flexor tone.     -12/12: increase baclofen  to 10mg  bid and observe for effect. Could increase to TID this weekend if he tolerates the bid dosing.     -Consider Botulinum toxin    LOS: 18 days A FACE TO FACE EVALUATION WAS PERFORMED  Nicholas Holt 10/28/2024, 7:35 AM

## 2024-10-28 NOTE — Progress Notes (Signed)
 Speech Language Pathology Daily Session Note  Patient Details  Name: Nicholas Holt MRN: [id number] Date of Birth: Mar 21, 1981  Today's Date: 10/28/2024 SLP Individual Time: 9099-9054 SLP Individual Time Calculation (min): 45 min  Short Term Goals: Week 2: SLP Short Term Goal 1 (Week 2): STG = LTG due to ELOS  Skilled Therapeutic Interventions: Skilled therapy session focused on cognitive and communicative goals. SLP facilitated session by targeting orientation. Patient oriented to self, age and location, however required external aid for time. Patient independently ID Christmas Day on the calendar and SLP educated patient on d/c date. SLP targeted communication through review of speech intelligibility strategies and pacing board. SLP provided minA during repetition of sentences to reach 90% intelligibility. SLP then challenged patient by prompting him to describe photo scenes with use of strategies to achieve 80% intelligibility given modA. Patient left in chair with alarm set and call bell in reach. Continue POC  Pain denies  Therapy/Group: Individual Therapy  Kasen Adduci M.A., CCC-SLP 10/28/2024, 7:49 AM

## 2024-10-29 ENCOUNTER — Other Ambulatory Visit: Payer: Self-pay | Admitting: Internal Medicine

## 2024-10-29 DIAGNOSIS — F4322 Adjustment disorder with anxiety: Secondary | ICD-10-CM

## 2024-10-29 DIAGNOSIS — G40909 Epilepsy, unspecified, not intractable, without status epilepticus: Secondary | ICD-10-CM

## 2024-10-29 MED ORDER — LORAZEPAM 1 MG PO TABS
4.0000 mg | ORAL_TABLET | Freq: Every day | ORAL | Status: DC
  Administered 2024-10-29 – 2024-10-30 (×2): 4 mg via ORAL
  Filled 2024-10-29 (×2): qty 4

## 2024-10-29 NOTE — Progress Notes (Signed)
 Physical Therapy Session Note  Patient Details  Name: WOODROW DRAB MRN: [id number] Date of Birth: [birth year]  Today's Date: 10/29/2024 PT Individual Time: 1120-1202 PT Individual Time Calculation (min): 42 min   Short Term Goals: Week 3:  PT Short Term Goal 1 (Week 3): = LTGs due ELOS (extended)  Skilled Therapeutic Interventions/Progress Updates:   Pt sitting in [facility] with family present on entrance to room. Pt alert and agreeable to PT. Pt, PTA and pt family discussed L toe cap to be placed today [facility], and that new AFO that pt has been wearing is his. PTA also discussed about need for family education to begin, specifically with ambulation. Discussed about pt potential need of  WC for longer distance ambulation as option to decrease caregiver burden as pt still requires CGA to ambulate, but increased cuing for safety awareness and decrease of cadence to avoid anterior LOB. Pt father present during session. Pt performed transfers during session with close supervision/CGA and improvement in standing balance (no posterior weight shift present throughout as previously observed). Pt ambulated from room<main gym with light CGA and cues to decrease cadence. Pt navigated 12 (6) steps with light CGA for safety and pt requiring cues to ascend with R LE, and descend with L. Pt also required cues to ensure entire foot on step. Pt with decrease in L hip flexion, requiring increased effort to advance L LE to next step while descending. Pt with BHR. Pt also required cues to perform step-to vs step through. Pt father educated on cues. Pt ambulated from gym<room with cues to stay beside/behind pts father to ensure safe cadence. Pt with close supervision and required mod cuing to decrease cadence but ambulated distance (roughly 150') without LOB.     Therapy Documentation Precautions:  Precautions Precautions: Fall Recall of Precautions/Restrictions: Impaired Precaution/Restrictions Comments:  Left AFO, h/o left hemiplegia Restrictions Weight Bearing Restrictions Per Provider Order: No  Therapy/Group: Individual Therapy  Kameisha Malicki PTA 10/29/2024, 12:49 PM

## 2024-10-29 NOTE — Progress Notes (Signed)
 Physical Therapy Note  Patient Details  Name: Nicholas Holt MRN: [id number] Date of Birth: 10/03/1981 Today's Date: 10/29/2024    Physical Therapist Assistant participated in the interdisciplinary team conference, providing clinical information regarding the patients current status, treatment goals, and weekly focus, including any barriers that need to be addressed. PT present during conference. Please see the Inpatient Rehabilitation Team Conference and Plan of Care Update for further details.     Hardeep Reetz PTA 10/29/2024, 3:39 PM

## 2024-10-29 NOTE — Progress Notes (Addendum)
 PROGRESS NOTE   Subjective/Complaints:  Still having tightness in calf and forearm on left , no pain  ROS: as per HPI. Denies CP, SOB, abd pain, N/V/D/C, or any other complaints at this time.    Objective:   No results found.  No results for input(s): WBC, HGB, HCT, PLT in the last 72 hours.        Intake/Output Summary (Last 24 hours) at 10/29/2024 0817 Last data filed at 10/29/2024 0500 Gross per 24 hour  Intake 460 ml  Output --  Net 460 ml         Physical Exam: Vital Signs Blood pressure 105/60, pulse 74, temperature 98 F (36.7 C), temperature source Oral, resp. rate 17, height 5' 8 (1.727 m), weight 87.8 kg, SpO2 93%.    General: No acute distress Mood and affect are appropriate Heart: Regular rate and rhythm no rubs murmurs or extra sounds Lungs: Clear to auscultation, breathing unlabored, no rales or wheezes Abdomen: Positive bowel sounds, soft nontender to palpation, nondistended Extremities: No clubbing, cyanosis, or edema Skin: No evidence of breakdown, no evidence of rash  MSK: No tenderness to palpation throughout left lower extremity, dorsal plantar foot, or ankle squeeze.  Full passive range of motion.  No crepitus or deformity.  Neuro: Awake, alert.  Follows commands appropriately. moderately dysarthric,  Somewhat Ataxic with arms as well B/L RUE- 5/5 throughout RLE- 5/5 throughout  LUE- biceps and triceps 3-/5, FF 4/5, FA 4-/5; WE 0 contracted with increased elbow and wrist flexor tone  LLE- HF 3-/5; KE 4-/5; KF 3-/5; DF 2-/5 and PF 2-/5   Likely contracture L wrist, -5 to 10 degrees in flexion from neutral, L EF MAS 1+; flexion contractures L toes, MAS 3-4 L PF,   Sensation to LT intact  Minimal limb dysmetria RLE    Assessment/Plan: 1. Functional deficits which require 3+ hours per day of interdisciplinary therapy in a comprehensive inpatient rehab  setting. Physiatrist is providing close team supervision and 24 hour management of active medical problems listed below. Physiatrist and rehab team continue to assess barriers to discharge/monitor patient progress toward functional and medical goals  Care Tool:  Bathing    Body parts bathed by patient: Left arm, Chest, Abdomen, Front perineal area, Right upper leg, Left upper leg, Face, Right arm, Buttocks   Body parts bathed by helper: Right lower leg, Left lower leg     Bathing assist Assist Level: Minimal Assistance - Patient > 75%     Upper Body Dressing/Undressing Upper body dressing   What is the patient wearing?: Pull over shirt    Upper body assist Assist Level: Supervision/Verbal cueing    Lower Body Dressing/Undressing Lower body dressing      What is the patient wearing?: Underwear/pull up, Pants     Lower body assist Assist for lower body dressing: Minimal Assistance - Patient > 75%     Toileting Toileting Toileting Activity did not occur (Clothing management and hygiene only): N/A (no void or bm)  Toileting assist Assist for toileting: Contact Guard/Touching assist     Transfers Chair/bed transfer  Transfers assist     Chair/bed transfer assist level: Contact Guard/Touching assist  Locomotion Ambulation   Ambulation assist      Assist level: Contact Guard/Touching assist Assistive device: No Device Max distance: 150'   Walk 10 feet activity   Assist     Assist level: Contact Guard/Touching assist Assistive device: No Device   Walk 50 feet activity   Assist    Assist level: Contact Guard/Touching assist Assistive device: No Device    Walk 150 feet activity   Assist    Assist level: Contact Guard/Touching assist Assistive device: No Device    Walk 10 feet on uneven surface  activity   Assist     Assist level: Contact Guard/Touching assist Assistive device: Other (comment) (rail)   Wheelchair     Assist  Is the patient using a wheelchair?: Yes Type of Wheelchair: Manual    Wheelchair assist level: Moderate Assistance - Patient 50 - 74% Max wheelchair distance: 150    Wheelchair 50 feet with 2 turns activity    Assist        Assist Level: Moderate Assistance - Patient 50 - 74%   Wheelchair 150 feet activity     Assist      Assist Level: Maximal Assistance - Patient 25 - 49%   Blood pressure 105/60, pulse 74, temperature 98 F (36.7 C), temperature source Oral, resp. rate 17, height 5' 8 (1.727 m), weight 87.8 kg, SpO2 93%.  Medical Problem List and Plan: 1. Functional deficits secondary to L inferior basal ganglia stroke (posterior limb of internal capsule)             -patient may  shower             -ELOS/Goals: 12/19 min A to mod I  -Continue CIR Team conf in am   2.  Antithrombotics: -DVT/anticoagulation: Amb 200' no need for prophyllaxis -antiplatelet therapy: DAPT X 3 weeks followed by Plavix  alone-- 3 more days of DAPT  3. Pain Management: Tylenol  and flexeril  prn.   4. Mood/Behavior/Sleep: LCSW to follow for evaluation and support             --ativan  6mg  at nights for sleep/nightmares             -antipsychotic agents: N/A  5. Neuropsych/cognition: This patient is not fully capable of making decisions on his own behalf.  6. Skin/Wound Care: Routine pressure relief measures.   7. Fluids/Electrolytes/Nutrition: Monitor I/O. Routine labs. Continue Klor CR 10mEq BID  8. Panhypopituitarism: On hydrocortisone  5mg  daily, levothyroxine  100mcg daily, and DDAVP  0.2mg  TID-->followed by Dr. Faythe -Currently DDAVP  is 0.1mg  QID  9. Low grade fever: resolved CXR and UA neg  10.  HTN: Monitor BP tid--on Losartan  25mg  daily  -12/15 very good control   Vitals:   10/25/24 1331 10/25/24 1915 10/26/24 0620 10/26/24 1421  BP: 124/76 (!) 141/84 107/66 130/81   10/26/24 1943 10/27/24 0614 10/27/24 1354 10/27/24 1949  BP: 124/71 113/82 136/81 132/85   10/28/24 0526  10/28/24 1634 10/28/24 1948 10/29/24 0449  BP: 114/72 133/74 118/79 105/60     11.  Hypernatremia: Resolved Sodium trending back up and DDAVP  increased to 2 mg TID today, Mom prefers intranasal route for pt thinks it controls urination better , will change to home dose and monitor 0.1 QID, (sometimes used .2mg  at noc)   Recheck BMP stable 12/15-- cont nasal DDAVP     Latest Ref Rng & Units 10/27/2024    6:38 AM 10/23/2024    6:00 AM 10/20/2024    6:31 AM  BMP  Glucose  70 - 99 mg/dL 86  81  88   BUN 6 - 20 mg/dL 13  14  12    Creatinine 0.61 - 1.24 mg/dL 8.87  8.90  8.86   Sodium 135 - 145 mmol/L 140  139  140   Potassium 3.5 - 5.1 mmol/L 3.9  4.0  4.5   Chloride 98 - 111 mmol/L 106  103  104   CO2 22 - 32 mmol/L 26  30  27    Calcium  8.9 - 10.3 mg/dL 8.6  8.8  8.7    12. Leucocytosis: resolved   13.  H/o Supersella germinoma: Hx of left hemiparesis, cognitive deficits and seizures in the past (no meds)   14. Mild OSA: Unable to tolerate CPAP  15. Mild constipation- pt goes daily at home- might need to intervene if hasn't gone by tomorrow.   -received MoM 15ml and effective, on senokot S 2tabs daily - LBM 12/13- not documented  16. HLD: continue Rosuvastatin  20mg  daily  17.  Spasticity - LUE and LLE  - Left wrist AND toe contracture vs spasticity consider botulinum toxin injection - discussed with pt and his mother, mom is in favor , pt unsure  - 12/6: Add Baclofen  5 mg TID; has PRN flexeril . Bracing as above - 12/7: Some improvement in spasms, mild improvement in elbow flexor tone.     -12/12: increase baclofen  to 10mg  bid and observe for effect. Could increase to TID this weekend if he tolerates the bid dosing.     Botox 50U per ML Sterile PF NS, Lot #[id number], exp 2027/10 for wrist flexor and ankle plantar flexor spasticity , 150U Gastro soleus, 50U FCR, 50U PT -Consent form signed by father, Time out taken, injection using 25g 1.5  needle after betadine prep.  EBL 0mL.  Pt  tolerated procedure well.   LOS: 19 days A FACE TO FACE EVALUATION WAS PERFORMED  Nicholas Holt 10/29/2024, 8:17 AM

## 2024-10-29 NOTE — Progress Notes (Signed)
 Occupational Therapy Session Note  Patient Details  Name: Nicholas Holt MRN: [id number] Date of Birth: 20-Jun-1981  Today's Date: 10/29/2024 OT Individual Time: 0950-1040 OT Individual Time Calculation (min): 50 min   Today's Date: 10/29/2024 OT Individual Time: 8694-8652 OT Individual Time Calculation (min): 42 min   Short Term Goals: Week 3:  OT Short Term Goal 1 (Week 3): STGs = upgraded LTGs  Skilled Therapeutic Interventions/Progress Updates:   Session 1: Pt greeted sitting in recliner for skilled OT session with focus on dynamic standing balance, functional transfers, and functional mobility.  Pain: Pt with no reports of pain, OT offering intermediate rest breaks and positioning suggestions throughout session to address pain/fatigue and maximize participation/safety in session.   Functional Transfers: Stand-pivots during session with CGA-close supervision.   Self Care Tasks: No needs voiced this session.   Therapeutic Activities: Pt instructed in BLE coordination activity using colored/numbered visual targets on the floor. Pt tasked to tap visual target as dictated by therapist (I.e left foot, purple), targets placed in front semi-circle layout, CGA provided with cues for anterior weight-shift to address intermediate retropulsion. Targets upgraded to cone height, patient with mild lateral LOBs, corrected with up to Min A, patient intermediately seeking RUE support.   Education: Mother educated on simple cues, including Nose over toes, to provide patient to manage retropulsion. Re-iterated the importance for BLE placement before attempting stand as well as allowing patient the time to initiate the transfer prior to providing assistance.   Pt remained sitting in recliner with 4Ps assessed and immediate needs met. Pt continues to be appropriate for skilled OT intervention to promote further functional independence in ADLs/IADLs.   Session 2: Pt greeted resting in bed for  skilled OT session with focus on general conditioning, functional transfers, and functional mobility .   Pain: Pt with no reports of pain. OT offering intermediate rest breaks and positioning suggestions throughout session to address pain/fatigue and maximize participation/safety in session.   Functional Transfers: Ambulation from room<>ortho gym with CGA + no AD. Progressing towards close supervision.   Self Care Tasks: Max A provided for donning B tennis shoes/AFO. 3/3 standing void with close supervision.   Therapeutic Exercise: In standing, pt instructed in series of RUE strengthening exercises, details below: x20 bicep curls into x20 overhead press into x20 chest press, patient uses 2# medicine ball, CGA provided for dynamic standing balance. Circuit repeated a total of 3 times.  X4 mins of Scifit modality, level 5 resistance with OT positioning L-hand.  Pt remained sitting in recliner with 4Ps assessed and immediate needs met. Pt continues to be appropriate for skilled OT intervention to promote further functional independence in ADLs/IADLs.   Therapy Documentation Precautions:  Precautions Precautions: Fall Recall of Precautions/Restrictions: Impaired Precaution/Restrictions Comments: Left AFO, h/o left hemiplegia Restrictions Weight Bearing Restrictions Per Provider Order: No   Therapy/Group: Individual Therapy  Nereida Habermann, OTR/L, MSOT  10/29/2024, 6:17 AM

## 2024-10-29 NOTE — Progress Notes (Signed)
 Occupational Therapy Note  Patient Details  Name: AXELL TRIGUEROS MRN: [id number] Date of Birth: [birth year]  Occupational Therapist participated in the interdisciplinary team conference, providing clinical information regarding the patients current status, treatment goals, and weekly focus, including any barriers that need to be addressed. Please see the Inpatient Rehabilitation Team Conference and Plan of Care Update for further details.     Katheryn SQUIBB Woodson 10/29/2024, 10:12 AM

## 2024-10-29 NOTE — Patient Care Conference (Signed)
 Inpatient RehabilitationTeam Conference and Plan of Care Update Date: 10/29/2024   Time: 10;04 AM    Patient Name: Nicholas Holt      Medical Record Number: [id number]  Date of Birth: 1981/07/29 Sex: Male         Room/Bed: 4M05C/4M05C-01 Payor Info: Payor: [employer]ICARE / Plan: DREMA DUAL COMPLETE / Product Type: *No Product type* /    Admit Date/Time:  10/10/2024  3:39 PM  Primary Diagnosis:  Left basal ganglia embolic stroke [facility])  Hospital Problems: Principal Problem:   Left basal ganglia embolic stroke [facility]) Active Problems:   Small vessel stroke [facility])    Expected Discharge Date: Expected Discharge Date: 10/31/24  Team Members Present: Physician leading conference: Dr. Prentice Compton Social Worker Present: Rhoda Clement, LCSW Nurse Present: Barnie Ronde, RN PT Present: Delinda Bertrand, PTA;Sherlean Perks, PT OT Present: Katheryn Mines, OT SLP Present: Blaise Alderman, SLP PPS Coordinator present : Eleanor Colon, SLP     Current Status/Progress Goal Weekly Team Focus  Bowel/Bladder   pt is cont x2 LBM 12/15   pt remain cont x2   assist with ambulation to bathroom Q4    Swallow/Nutrition/ Hydration               ADL's   Supervision for UB care; Min A for LB care.   Upgraded to [facility]   Discharge planning and ongoing caregiver education    Mobility   Bed mobility = supervision; Transfers CGA; Ambulation = CGA (VC to decrease speed); Stairs = ? need to assess today   minA overall  NMRE, Weight shift, Gait, Stairs, pt and family education, tranfser, attention to task    Communication   severe dysarthria - improving in a controlled environment   minA @ 90%   SLOP strategies, use of pacing board in functional communication scenarios    Safety/Cognition/ Behavioral Observations  severe cognitive deficits   modA   orientation, basic problem solving, memory    Pain   Pt denies pain.   pt remain free from pain   assess pt q4  hour    Skin   skin intact   pt skin remain intact  encourage ambulation and freq repositioning      Discharge Planning:  Parents have been here and participating in son's care. He has exceeded his goals and was extended to 12/19. Home health in place and has DME. Will give adult day care resources to parents to pursue if choose to   Team Discussion: Patient admitted post left basal ganglia CVA with history of germinoma, multi infarcts and chronic left hemiparesis; using a left AFO on LE with spasticity/wrist contracture and DDAVP . Father endorses STMD and orientation deficits have worsened with new CVA and note dysarthria with low vocal intensity.  Little right sided symptoms with truncal weakness, poor memory and small attention span with cognitive dysarthria and balance issues.  Patient on target to meet rehab goals: yes, does better in a controlled environment; continue to work on problem solving, memory, recall and general orientation. Needs supervision for upper body care and min assist for lower body care. Transfers with CGA. Needs CGA to ambulate with CGA and HHA with max cues; CGA for use of a platform walker however is unsafe due to lack of awareness of environmental safety.    *See Care Plan and progress notes for long and short-term goals.   Revisions to Treatment Plan:  Botox injection for spasticity vs contracture AFO consult Stair trial  Teaching Needs: Safety, medications, transfers, toileting, etc.   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 Comments: panhypopituitarism Possible Resolutions to [employer] Focus: caregiver  training, upgrade equipoment and orthotic rec   Continued Need for Acute Rehabilitation Level of Care: The patient requires daily medical management by a physician with specialized training in physical medicine and rehabilitation for the following reasons: Direction of a multidisciplinary physical rehabilitation program to maximize functional independence : Yes Medical management of patient stability for increased activity during participation in an intensive rehabilitation regime.: Yes Analysis of laboratory values and/or radiology reports with any subsequent need for medication adjustment and/or medical intervention. : Yes   I attest that I was present, lead the team conference, and concur with the assessment and plan of the team.   Fredericka Sober B 10/29/2024, 2:18 PM

## 2024-10-29 NOTE — Progress Notes (Signed)
 Patient ID: Nicholas Holt, male   DOB: [birth year], 43 y.o.   MRN: [id number]  Met with pt and parents to update regarding team conference and progress this week. Therapy team to include parents next couple of days and do hands on education to prepare for discharge on Friday. Home health in place and await further equipment needs.

## 2024-10-29 NOTE — Progress Notes (Signed)
 Physical Therapy Session Note  Patient Details  Name: Nicholas Holt MRN: [id number] Date of Birth: September 19, 1981  Today's Date: 10/29/2024 PT Individual Time: 8561-8467 PT Individual Time Calculation (min): 54 min   Short Term Goals: Week 3:  PT Short Term Goal 1 (Week 3): = LTGs due ELOS (extended)  Skilled Therapeutic Interventions/Progress Updates:     Pt received seated in WC and agrees to therapy. No complaint of pain. WC transport to gym for time management. Pt performs stand step transfer to mat table with cues for sequencing and positioning. PT provides demonstration of floor transfer. Pt then completes floor transfer with cues for optimal sequencing and body position. Pt requires some cueing of awareness of surrounding, but no physical assistance required. Pt completes additional floor to work on strengthening and coordination, with similar cues and assistance. Pt then stands and ambulates x150' with CGA and cues for increasing LT and Rt stride length to improve gait pattern and balance. Seated rest break.   Pt participates in Wii bowling activity to challenge dynamic standing balance, coordination, attention, and activity tolerance. Pt performs in standing with frequent minA provided due to pt having consistent posterior bias. Pt close to being able to control balance without assistance, but has decresaed control with stepping backward following each throw of the virtual bowling ball. PT provides cues for posture and and trunk stability.   Pt ambulates additional x175' with similar assistance and cues. Left seated with all needs within reach.  Therapy Documentation Precautions:  Precautions Precautions: Fall Recall of Precautions/Restrictions: Impaired Precaution/Restrictions Comments: Left AFO, h/o left hemiplegia Restrictions Weight Bearing Restrictions Per Provider Order: No    Therapy/Group: Individual Therapy  Elsie JAYSON Dawn, PT, DPT 10/29/2024, 4:18 PM

## 2024-10-30 LAB — BASIC METABOLIC PANEL WITH GFR
Anion gap: 7 (ref 5–15)
BUN: 14 mg/dL (ref 6–20)
CO2: 28 mmol/L (ref 22–32)
Calcium: 9 mg/dL (ref 8.9–10.3)
Chloride: 108 mmol/L (ref 98–111)
Creatinine, Ser: 1.1 mg/dL (ref 0.61–1.24)
GFR, Estimated: >60 mL/min (ref >60)
Glucose, Bld: 86 mg/dL (ref 70–99)
Potassium: 4.1 mmol/L (ref 3.5–5.1)
Sodium: 143 mmol/L (ref 135–145)

## 2024-10-30 NOTE — Progress Notes (Signed)
 Speech Language Pathology Daily Session Note  Patient Details  Name: Nicholas Holt MRN: [id number] Date of Birth: 05-Mar-1981  Today's Date: 10/30/2024 SLP Individual Time: 1330-1415 SLP Individual Time Calculation (min): 45 min  Short Term Goals: Week 3: SLP Short Term Goal 1 (Week 3): STGs = LTGs due to ELOS  Skilled Therapeutic Interventions:  Pt greeted at bedside for tx targeting communication and cognition. He was assisted to the ST office via WC. During initial structured conversation, he demonstrated notably improved speech intelligibility (~90%) and reduced rate of speech. During structured task requiring response length of 1-2 sentences, he benefited from minA to utilize pacing board and maintain adequate rate of speech production. He then completed a functional problem solving task re community settings (AKA [employer]) and required only modA to ID potential items available. He utilized the calendar w/ minA to ID today's date and dow but was independently oriented to month and year. At the end of tx tasks, direct hand off completed w/ therapy staff (PT and OT) in the dayroom. See d/c summary for more info.   Pain Pain Assessment Pain Scale: 0-10 Pain Score: 0-No pain  Therapy/Group: Individual Therapy  [name] 10/30/2024, 2:09 PM

## 2024-10-30 NOTE — Progress Notes (Addendum)
 Patient's mother request that desmopressin  nasal spray be given at 0800. Passed off to oncoming LPN.

## 2024-10-30 NOTE — Progress Notes (Signed)
 Physical Therapy Discharge Summary  Patient Details  Name: Nicholas Holt MRN: [id number] Date of Birth: 1981-02-04  Date of Discharge from PT service:October 30, 2024  Patient has met 10 of 10 long term goals due to {due un:6958322}.  Patient to discharge at [facility] level {LOA:3049010}.   Patient's care partner {care partner:3041650} to provide the necessary {assistance:3041652} assistance at discharge.  Reasons goals not met: ***  Recommendation:  Patient will benefit from ongoing skilled PT services in {setting:3041680} to continue to advance safe functional mobility, address ongoing impairments in ***, and minimize fall risk.  Equipment: {equipment:3041657}  Reasons for discharge: {Reason for discharge:3049018}  Patient/family agrees with progress made and goals achieved: {Pt/Family agree with progress/goals:3049020}  PT Discharge Precautions/Restrictions Precautions Precautions: Fall Precaution/Restrictions Comments: Left AFO Restrictions Weight Bearing Restrictions Per Provider Order: No Pain Pain Assessment Pain Scale: 0-10 Pain Score: 0-No pain Pain Interference Pain Interference Pain Effect on Sleep: 1. Rarely or not at all Pain Interference with Therapy Activities: 1. Rarely or not at all Pain Interference with Day-to-Day Activities: 1. Rarely or not at all Vision/Perception     Cognition Overall Cognitive Status: History of cognitive impairments - at baseline Arousal/Alertness: Awake/alert Orientation Level: Oriented X4 Attention: Sustained Sustained Attention: Impaired Memory: Impaired Memory Impairment: Decreased short term memory Awareness: Impaired Awareness Impairment: Intellectual impairment Safety/Judgment: Impaired Sensation Sensation Light Touch: Appears Intact Hot/Cold: Appears Intact Proprioception: Impaired by gross assessment Coordination Gross Motor Movements are Fluid and Coordinated: No Coordination and Movement  Description: Decreased motor plnning of L LE Motor  Motor Motor: Other (comment) (L hemiparesis) Motor - Discharge Observations: Pt has posterior lean when sitting and can correct when cued.  Mobility Bed Mobility Bed Mobility: Supine to Sit;Sit to Supine Rolling Left: Supervision/Verbal cueing Left Sidelying to Sit: Supervision/Verbal cueing Supine to Sit: Supervision/Verbal cueing Sitting - Scoot to Edge of Bed: Supervision/Verbal cueing Sit to Supine: Supervision/Verbal cueing Transfers Transfers: Sit to Stand;Stand to Sit;Stand Pivot Transfers Sit to Stand: Contact Guard/Touching assist Stand to Sit: Contact Guard/Touching assist Stand Pivot Transfers: Contact Guard/Touching assist Stand Pivot Transfer Details: Verbal cues for precautions/safety Transfer (Assistive device): None Locomotion  Gait Ambulation: Yes Gait Assistance: Contact Guard/Touching assist Gait Distance (Feet): 150 Feet Assistive device: None Gait Assistance Details: Verbal cues for safe use of DME/AE Gait Assistance Details: Does not complete heel-toe but has improved since evaluation. Gait Gait: Yes Gait Pattern: Impaired Gait Pattern: Step-through pattern;Decreased step length - left;Decreased hip/knee flexion - left;Decreased dorsiflexion - left;Decreased weight shift to right;Poor foot clearance - left (not full step through but not quite step to) Gait velocity: makes minimal changes to gait speed when cued (needs to decrease to avoid anterior LOB) Stairs / Additional Locomotion Stairs: Yes Stairs Assistance: Contact Guard/Touching assist Stair Management Technique: Two rails Number of Stairs: 12 Height of Stairs: 6 Wheelchair Mobility Wheelchair Mobility: Yes Wheelchair Assistance: Dependent - Patient 0% Wheelchair Parts Management: Needs assistance Distance: 150  Trunk/Postural Assessment  Cervical Assessment Cervical Assessment: Within Functional Limits Thoracic Assessment Thoracic  Assessment: Within Functional Limits Lumbar Assessment Lumbar Assessment: Within Functional Limits Postural Control Postural Control: Deficits on evaluation Righting Reactions: leans backward, delayed but has improved since evaluation Postural Limitations: posterior bias in sitting that has improved since evaluation. pt strong posterior bias in standing has significantly improved  Balance Balance Balance Assessed: Yes Static Sitting Balance Static Sitting - Balance Support: Feet supported;Right upper extremity supported Static Sitting - Level of Assistance: 5: Stand by assistance Dynamic Sitting Balance Dynamic Sitting -  Balance Support: During functional activity;Feet supported Dynamic Sitting - Level of Assistance: 5: Stand by assistance Sitting balance - Comments: Difficulty maintaining static sitting balance (leans posteriorly but can correct when cued) Static Standing Balance Static Standing - Balance Support: No upper extremity supported Static Standing - Level of Assistance: 5: Stand by assistance Dynamic Standing Balance Dynamic Standing - Balance Support: During functional activity Dynamic Standing - Level of Assistance: 5: Stand by assistance (CGA) Extremity Assessment  RLE Assessment RLE Assessment: Within Functional Limits     Billyjoe Go PTA   10/30/2024, 1:04 PM

## 2024-10-30 NOTE — Progress Notes (Signed)
 Patient ID: Nicholas Holt, male   DOB: January 11, 1981, 43 y.o.   MRN: [id number] Met with pt and parents to update regarding team conference progress and need to participate in therapies next few days to make sure comfortable with pt's care. Await any equipment needs.

## 2024-10-30 NOTE — Progress Notes (Signed)
 Physical Therapy Session Note  Patient Details  Name: Nicholas Holt MRN: [id number] Date of Birth: Mar 25, 1981  Today's Date: 10/30/2024 PT Individual Time: 1004-1030; 1116 - 1156 PT Individual Time Calculation (min): 26 min; 40 min   Short Term Goals: Week 3:  PT Short Term Goal 1 (Week 3): = LTGs due ELOS (extended)   SESSION 1 Skilled Therapeutic Interventions/Progress Updates: Patient sitting in recliner on entrance to room. Patient alert and agreeable to PT session.   Patient reported no pain.   Therapeutic Activity: Transfers: Pt performed sit<>stand transfers throughout session with CGA. Provided VC for pt to stand when PTA cues pt.  Pt ambulated from room<main gym without shoes or AFO donned due to [employer] rep arriving to place toe cap on it. Pt required overall light CGA and VC to decrease cadence and to not pass pt's father. Pt with decreased heel touch on L foot (pt father stated that attending MD provided botox to area). Pt father assisted pt with stair navigation. 12 (6) steps with pt requiring cue to ascend with R, and descend with L LE. Pt also required VC throughout to touch bottom of next step to ensure entire foot placed on step. Pt with B UE support on railing for half, then only R ascending and L descending due to home set up. Pt father educated to be below pt at all times on stairs, and to wait to do stairs with HHPT after d/c for safety (pt mother stated pt will be sleeping down stairs for a little while at first).   Patient handed off to OT session in main gym at end of session.  SESSION 2 Skilled Therapeutic Interventions/Progress Updates: Patient sitting in recliner with father present on entrance to room. Patient alert and agreeable to PT session.   Patient reported no pain  Therapeutic Activity: Transfers: Pt performed sit<>stand transfers throughout session with light CGA. Provided VC for pt to avoid standing until pt is cued to (max cuing throughout  session with education provided to pt's father that pt will attempt to stand when parents are not near by).  - Pt performed car transfer with overall CGA and pt use of railing to simulate handle in car. Pt demonstrated safe pivot in seat.   Neuromuscular Re-ed: - Step to 6 step with 7lb ankle weight donned on L LE. Pt required overall minA for 2 rounds and rest breaks required - Pt sat edge of mat and performed mass practice of dynamic standing balance and coordination of tossing basketball to goal. Pt with supervision throughout and max cuing to maintain B feet on ground with pt demonstrating poor trunk control to maintain in static upright posture.  - Pt ambulated throughout session with CGA and max cuing to decrease cadence and to improve L step clearance (pt made no change to clearance or cadence - pt seemingly does better with slower cadence when father was present).   NMR performed for improvements in motor control and coordination, balance, sequencing, judgement, and self confidence/ efficacy in performing all aspects of mobility at highest level of independence.   Patient sitting in recliner at end of session with brakes locked, father present and all needs within reach.      Therapy Documentation Precautions:  Precautions Precautions: Fall Recall of Precautions/Restrictions: Impaired Precaution/Restrictions Comments: Left AFO, h/o left hemiplegia Restrictions Weight Bearing Restrictions Per Provider Order: No  Therapy/Group: Individual Therapy  Amaria Mundorf PTA 10/30/2024, 12:18 PM

## 2024-10-30 NOTE — Progress Notes (Signed)
 Physical Therapy Session Note  Patient Details  Name: Nicholas Holt MRN: [id number] Date of Birth: August 27, 1981  Today's Date: 10/30/2024 PT Individual Time: 8567-8471 PT Individual Time Calculation (min): 56 min   Short Term Goals: Week 3:  PT Short Term Goal 1 (Week 3): = LTGs due ELOS (extended)  Skilled Therapeutic Interventions/Progress Updates:     Pt received seated in WC and agrees to therapy. No complaint of pain. Pt participates in dance activity to work on coordination and activity tolerance, performing coordinated gross and fine motor movements in conjunction with visual and auditory cues. PT provides verbal and tactile cueing for correct performance.   Pt performs sit to stand and ambulates x50' to toilet with CGA and no AD with cues to increase Lt stride length to improve reciprocal gait pattern and balance. Following, pt ambulates x50' back to [facility] with same cues. Pt completes multiple additional bouts of ambulation to work on endurance, balance, and coordination, completing x100', x150', and x75' with seated rest breaks. Pt also completes 4 6 steps with Rt handrail and cues for safe step sequencing and positioning, with CGA provided overall.   PT provides education to mother regarding safe mobility for pt following discharge. Pt left seated in [facility] with all needs within reach and family present.   Therapy Documentation Precautions:  Precautions Precautions: Fall Recall of Precautions/Restrictions: Impaired Precaution/Restrictions Comments: Left AFO Restrictions Weight Bearing Restrictions Per Provider Order: No   Therapy/Group: Individual Therapy  Elsie JAYSON Dawn, PT, DPT 10/30/2024, 5:06 PM

## 2024-10-30 NOTE — Progress Notes (Signed)
 Occupational Therapy Discharge Summary  Patient Details  Name: Nicholas Holt MRN: [id number] Date of Birth: Nov 10, 1981  Date of Discharge from OT service:October 30, 2024   Patient has met 11 of 11 long term goals due to improved activity tolerance, improved balance, postural control, ability to compensate for deficits, functional use of  LEFT upper extremity, improved attention, improved awareness, and improved coordination.  Patient to discharge at overall CGA/ Supervision level.  Patient's care partner is independent to provide the necessary physical and cognitive assistance at discharge.  His parents are heavily involved in his care and have attended family education sessions.   Reasons goals not met: n/a  Recommendation:  Patient will benefit from ongoing skilled OT services in outpatient setting to continue to advance functional skills in the area of BADL.  Equipment: No equipment provided  Reasons for discharge: treatment goals met  Patient/family agrees with progress made and goals achieved: Yes  OT Discharge Precautions/Restrictions  Precautions Precautions: Fall Precaution/Restrictions Comments: Left AFO Restrictions Weight Bearing Restrictions Per Provider Order: No  ADL ADL Eating: Set up Grooming: Setup Where Assessed-Grooming: Sitting at sink Upper Body Bathing: Supervision/safety Where Assessed-Upper Body Bathing: Shower Lower Body Bathing: Contact guard Where Assessed-Lower Body Bathing: Shower Upper Body Dressing: Supervision/safety Where Assessed-Upper Body Dressing: Sitting at sink Lower Body Dressing:  (Supervision pants, mod shoes) Where Assessed-Lower Body Dressing: Wheelchair Toileting: Contact guard Where Assessed-Toileting: Public Affairs Consultant guard, Close supervision Toilet Transfer Method: Proofreader: Grab bars Tub/Shower Transfer: Scientific Laboratory Technician Method: Curator: Emergency planning/management officer, Acupuncturist: Administrator, Arts Method: Warden/ranger: Grab bars ADL Comments: improving with posterior lean but needs constant cues Vision Baseline Vision/History: 1 Wears glasses Patient Visual Report: No change from baseline Vision Assessment?: No apparent visual deficits Perception  Perception: Within Functional Limits Praxis Praxis: WFL Cognition Cognition Overall Cognitive Status: History of cognitive impairments - at baseline Arousal/Alertness: Awake/alert Memory: Impaired Memory Impairment: Decreased short term memory Attention: Sustained Sustained Attention: Impaired Awareness: Impaired Awareness Impairment: Intellectual impairment Safety/Judgment: Impaired Brief Interview for Mental Status (BIMS) Repetition of Three Words (First Attempt): 3 Temporal Orientation: Year: Missed by more than 5 years Temporal Orientation: Month: Accurate within 5 days Temporal Orientation: Day: Incorrect Recall: Sock: Yes, no cue required Recall: Blue: Yes, no cue required Recall: Bed: Yes, no cue required BIMS Summary Score: 11 Sensation Sensation Light Touch: Appears Intact Hot/Cold: Appears Intact Proprioception: Impaired by gross assessment Stereognosis: Impaired by gross assessment Coordination Gross Motor Movements are Fluid and Coordinated: No Fine Motor Movements are Fluid and Coordinated: No Coordination and Movement Description: Decreased motor plnning of L LE Motor  Motor Motor: Other (comment) (L hemiparesis) Motor - Discharge Observations: Pt has posterior lean when sitting and can correct when cued. Mobility  Bed Mobility Bed Mobility: Supine to Sit;Sit to Supine Rolling Left: Supervision/Verbal cueing Left Sidelying to Sit: Supervision/Verbal cueing Supine to Sit: Supervision/Verbal cueing Sitting - Scoot to Edge of Bed: Supervision/Verbal cueing Sit to Supine:  Supervision/Verbal cueing Transfers Sit to Stand: Contact Guard/Touching assist Stand to Sit: Contact Guard/Touching assist  Trunk/Postural Assessment  Cervical Assessment Cervical Assessment: Within Functional Limits Thoracic Assessment Thoracic Assessment: Within Functional Limits Lumbar Assessment Lumbar Assessment: Within Functional Limits Postural Control Postural Control: Deficits on evaluation Righting Reactions: leans backward, delayed but has improved since evaluation Postural Limitations: posterior bias in sitting that has improved since evaluation. pt strong posterior bias in standing has  significantly improved  Balance Balance Balance Assessed: Yes Static Sitting Balance Static Sitting - Balance Support: Feet supported;Right upper extremity supported Static Sitting - Level of Assistance: 5: Stand by assistance Dynamic Sitting Balance Dynamic Sitting - Balance Support: During functional activity;Feet supported Dynamic Sitting - Level of Assistance: 5: Stand by assistance Sitting balance - Comments: Difficulty maintaining static sitting balance (leans posteriorly but can correct when cued) Static Standing Balance Static Standing - Balance Support: No upper extremity supported Static Standing - Level of Assistance: 5: Stand by assistance Dynamic Standing Balance Dynamic Standing - Balance Support: During functional activity Dynamic Standing - Level of Assistance: 5: Stand by assistance (CGA) Extremity/Trunk Assessment RUE Assessment RUE Assessment: Within Functional Limits LUE Assessment Active Range of Motion (AROM) Comments: h/o left hemiplegia - able to reach left arm overhead~ 140*,  full elbow extension, lacks passive/activewrist extension - positioned in full wrist flexion General Strength Comments: loose grasp on L hand   Nicholas Holt 10/30/2024, 1:18 PM

## 2024-10-30 NOTE — Progress Notes (Signed)
 Inpatient Rehabilitation Discharge Medication Review by a Pharmacist  A complete drug regimen review was completed for this patient to identify any potential clinically significant medication issues.  High Risk Drug Classes Is patient taking? Indication by Medication  Antipsychotic No   Anticoagulant No   Antibiotic No   Opioid No   Antiplatelet Yes   Hypoglycemics/insulin No   Vasoactive Medication Yes Losartan - HTN  Chemotherapy No   Other Yes Baclofen - muscle spasms DDAVP - panhypopituitarism sodium derangement Crestor - HLD Flexeril - muscle spasms     Type of Medication Issue Identified Description of Issue Recommendation(s)  Drug Interaction(s) (clinically significant)     Duplicate Therapy     Allergy     No Medication Administration End Date     Incorrect Dose     Additional Drug Therapy Needed     Significant med changes from prior encounter (inform family/care partners about these prior to discharge).    Other       Clinically significant medication issues were identified that warrant physician communication and completion of prescribed/recommended actions by midnight of the next day:  No   Time spent performing this drug regimen review (minutes):  30    Ali Mclaurin BS, PharmD, BCPS Clinical Pharmacist 10/30/2024 2:09 PM  Contact: [phone] after 3 PM

## 2024-10-30 NOTE — Progress Notes (Incomplete)
 Inpatient Rehabilitation Care Coordinator Discharge Note   Patient Details  Name: Nicholas Holt MRN: [id number] Date of Birth: 08-Jul-1981   Discharge location: HOME WITH PARENTS WHO ASSISTED WITH CARE PRIOR TO ADMISSION  Length of Stay: 21 DAYS  Discharge activity level: CGA-MIN ASSIST LEVEL  Home/community participation: HOMEBOUND  Patient response un:Yzjouy Literacy - How often do you need to have someone help you when you read instructions, pamphlets, or other written material from your doctor or pharmacy?: Always  Patient response un:Dnrpjo Isolation - How often do you feel lonely or isolated from those around you?: Sometimes  Services provided included: MD, RD, PT, OT, SLP, RN, CM, TR, Pharmacy, Neuropsych, SW  Financial Services:  Field Seismologist Utilized: Private Insurance [employer] DUAL COMPLETE  Choices offered to/list presented to: PARENTS  Follow-up services arranged:  Home Health, Patient/Family has no preference for HH/DME agencies Home Health Agency: [facility]  PT  OT  SP     HAS ALL NEEDED EQUIPMENT AND GAVE ADULT DAY CARE RESOURCES TO PARENTS IF WANT TO FOLLOW UP WITH    Patient response to transportation need: Is the patient able to respond to transportation needs?: Yes In the past 12 months, has lack of transportation kept you from medical appointments or from getting medications?: No In the past 12 months, has lack of transportation kept you from meetings, work, or from getting things needed for daily living?: No   Patient/Family verbalized understanding of follow-up arrangements:  Yes  Individual responsible for coordination of the follow-up plan: [facility] AND [facility] [phone]  Confirmed correct DME delivered: Raymonde Asberry MATSU 10/30/2024    Comments (or additional information):PT DID WELL AND MADE GOOD PROGRESS WHILE HERE. PARENTS STAYED HERE AND PARTICIPATED IN THERAPIES WITH PT AND FEEL COMFORTABLE WITH CARE.   Summary of Stay     Date/Time Discharge Planning CSW  10/28/24 0904 Parents have been here and participating in son's care. He has exceeded his goals and was extended to 12/19. Home health in place and has DME. Will give adult day care resources to parents to pursue if choose to RGD  10/22/24 0836 Parents concerned if will be ready to go home at DC date, they can only provide limited assist and want to make sure can manage at the level he wil be at DC. Both parents here daily and observing in therapies. RGD  10/15/24 0844 Home with parents who stay here with him-need him to be supervision but goals are min assist. Will see if can manage this level of care at DC. Await team's evaluations RGD       Nalina Yeatman, Asberry MATSU

## 2024-10-30 NOTE — Plan of Care (Signed)
?  Problem: RH Balance ?Goal: LTG: Patient will maintain dynamic sitting balance (OT) ?Description: LTG:  Patient will maintain dynamic sitting balance with assistance during activities of daily living (OT) ?Outcome: Completed/Met ?Goal: LTG Patient will maintain dynamic standing with ADLs (OT) ?Description: LTG:  Patient will maintain dynamic standing balance with assist during activities of daily living (OT)  ?Outcome: Completed/Met ?  ?Problem: Sit to Stand ?Goal: LTG:  Patient will perform sit to stand in prep for activites of daily living with assistance level (OT) ?Description: LTG:  Patient will perform sit to stand in prep for activites of daily living with assistance level (OT) ?Outcome: Completed/Met ?  ?Problem: RH Grooming ?Goal: LTG Patient will perform grooming w/assist,cues/equip (OT) ?Description: LTG: Patient will perform grooming with assist, with/without cues using equipment (OT) ?Outcome: Completed/Met ?  ?Problem: RH Bathing ?Goal: LTG Patient will bathe all body parts with assist levels (OT) ?Description: LTG: Patient will bathe all body parts with assist levels (OT) ?Outcome: Completed/Met ?  ?Problem: RH Dressing ?Goal: LTG Patient will perform upper body dressing (OT) ?Description: LTG Patient will perform upper body dressing with assist, with/without cues (OT). ?Outcome: Completed/Met ?Goal: LTG Patient will perform lower body dressing w/assist (OT) ?Description: LTG: Patient will perform lower body dressing with assist, with/without cues in positioning using equipment (OT) ?Outcome: Completed/Met ?  ?Problem: RH Toileting ?Goal: LTG Patient will perform toileting task (3/3 steps) with assistance level (OT) ?Description: LTG: Patient will perform toileting task (3/3 steps) with assistance level (OT)  ?Outcome: Completed/Met ?  ?Problem: RH Functional Use of Upper Extremity ?Goal: LTG Patient will use RT/LT upper extremity as a (OT) ?Description: LTG: Patient will use right/left upper  extremity as a stabilizer/gross assist/diminished/nondominant/dominant level with assist, with/without cues during functional activity (OT) ?Outcome: Completed/Met ?  ?Problem: RH Toilet Transfers ?Goal: LTG Patient will perform toilet transfers w/assist (OT) ?Description: LTG: Patient will perform toilet transfers with assist, with/without cues using equipment (OT) ?Outcome: Completed/Met ?  ?Problem: RH Tub/Shower Transfers ?Goal: LTG Patient will perform tub/shower transfers w/assist (OT) ?Description: LTG: Patient will perform tub/shower transfers with assist, with/without cues using equipment (OT) ?Outcome: Completed/Met ?  ?Problem: RH Attention ?Goal: LTG Patient will demonstrate this level of attention during functional activites (OT) ?Description: LTG:  Patient will demonstrate this level of attention during functional activites  (OT) ?Outcome: Completed/Met ?  ?

## 2024-10-30 NOTE — Discharge Summary (Shared)
 Physician Discharge Summary  Patient ID: Nicholas Holt MRN: [id number] DOB/AGE: 1981-05-14 43 y.o.  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 Elevated left hemidiaphragm. Left basilar atelectasis. No pleural effusion. No pneumothorax. HEART AND MEDIASTINUM: No acute abnormality of the cardiac and mediastinal silhouettes. BONES AND SOFT TISSUES: No acute osseous abnormality. IMPRESSION: 1. No acute cardiopulmonary process. 2. Chronic elevation of the left hemidiaphragm. Electronically signed by: Norman Gatlin MD 10/10/2024 07:28 PM EST RP Workstation: [id number]   ECHOCARDIOGRAM COMPLETE Result Date: 10/06/2024    ECHOCARDIOGRAM REPORT   Patient Name:   Nicholas Holt Date of Exam: 10/06/2024 Medical Rec #:  [id number]        Height:       68.0 in Accession #:    [id number]       Weight:       198.2 lb Date of Birth:  03/25/1981        BSA:          2.036 m Patient Age:    43 years         BP:           120/76 mmHg Patient Gender: M                HR:           86 bpm. Exam Location:  Inpatient Procedure: 2D Echo (Both Spectral and Color Flow Doppler were utilized during            procedure). Indications:    stroke  History:        Patient has prior history of Echocardiogram examinations, most                 recent 05/30/2022. Risk Factors:Hypertension, Dyslipidemia and                 Sleep Apnea.  Sonographer:    Tinnie Barefoot RDCS  Sonographer#2:  Sherlean Dubin Referring Phys: [id number] ARY CUMMINS  Sonographer Comments: Technically difficult study due to poor echo windows. Image acquisition challenging due to respiratory motion and Image acquisition challenging due to uncooperative patient. IMPRESSIONS  1. Left ventricular ejection fraction, by estimation, is 60 to 65%. The left ventricle has normal function. The left ventricle has no regional wall motion abnormalities. Left ventricular diastolic parameters were normal.  2. Right ventricular systolic function is normal. The right ventricular size is normal.  3. The mitral valve is normal in structure. No evidence of mitral valve regurgitation. No evidence of mitral stenosis.  4. The aortic valve is normal in structure. Aortic valve regurgitation is not visualized. No aortic stenosis is present.  5. The inferior vena cava is normal in size with greater than 50% respiratory variability, suggesting right atrial pressure of 3 mmHg. FINDINGS  Left Ventricle: Left ventricular ejection fraction, by estimation, is 60 to 65%. The left ventricle has normal function. The left ventricle has no regional wall motion abnormalities. The left ventricular internal cavity size was normal in size. There is  no left ventricular hypertrophy.  Left ventricular diastolic parameters were normal. Right Ventricle: The right ventricular size is normal. No increase in right ventricular wall thickness. Right ventricular systolic function is normal. Left Atrium: Left atrial size was normal in size. Right Atrium: Right atrial size was normal in size. Pericardium: There is no evidence of pericardial effusion. Mitral Valve: The mitral valve is normal in structure. No evidence of mitral valve regurgitation. No evidence of mitral valve stenosis. Tricuspid Valve: The tricuspid valve is normal in structure. Tricuspid valve regurgitation is not demonstrated. No evidence of tricuspid stenosis. Aortic Valve: The aortic valve is normal  in structure. Aortic valve regurgitation is not visualized. No aortic stenosis is present. Pulmonic Valve: The pulmonic valve was normal in structure. Pulmonic valve regurgitation is not visualized. No evidence of pulmonic stenosis. Aorta: The aortic root is normal in size and structure. Venous: The inferior vena cava is normal in size with greater than 50% respiratory variability, suggesting right atrial pressure of 3 mmHg. IAS/Shunts: The interatrial septum was not well visualized.  LEFT VENTRICLE PLAX 2D LVOT diam:     2.20 cm   Diastology LV SV:         76        LV e' medial:    6.85 cm/s LV SV Index:   37        LV E/e' medial:  11.6 LVOT Area:     3.80 cm  LV e' lateral:   10.70 cm/s LV IVRT:       99 msec   LV E/e' lateral: 7.4  RIGHT VENTRICLE RV Basal diam:  2.50 cm RV S prime:     12.60 cm/s TAPSE (M-mode): 1.5 cm LEFT ATRIUM             Index        RIGHT ATRIUM           Index LA Vol (A2C):   36.0 ml 17.68 ml/m  RA Area:     10.80 cm LA Vol (A4C):   25.6 ml 12.57 ml/m  RA Volume:   24.30 ml  11.94 ml/m LA Biplane Vol: 30.7 ml 15.08 ml/m  AORTIC VALVE LVOT Vmax:   116.00 cm/s LVOT Vmean:  77.100 cm/s LVOT VTI:    0.200 m  AORTA Ao Root diam: 2.90 cm MITRAL VALVE MV Area (PHT): 3.51 cm    SHUNTS MV Decel Time: 216 msec    Systemic VTI:  0.20 m MV E velocity: 79.30 cm/s  Systemic Diam: 2.20 cm MV A velocity: 95.20 cm/s MV E/A ratio:  0.83 Morene Brownie Electronically signed by Morene Brownie Signature Date/Time: 10/06/2024/1:06:25 PM    Final    CT ANGIO HEAD NECK W WO CM Result Date: 10/06/2024 EXAM: CT HEAD WITHOUT CTA HEAD AND NECK WITH AND WITHOUT 10/06/2024 01:26:36 AM TECHNIQUE: CTA of the head and neck was performed with and without the administration of 75 mL of iohexol  (OMNIPAQUE ) 350 MG/ML injection. Noncontrast CT of the head with reconstructed 2-D images are also provided for review. Multiplanar 2D and/or 3D reformatted images are provided for review. Automated exposure control,  iterative reconstruction, and/or weight based adjustment of the mA/kV was utilized to reduce the radiation dose to as low as reasonably achievable. COMPARISON: Head CT 10/03/2024 and brain MRI 10/05/2024. CLINICAL HISTORY: Stroke/TIA, determine embolic source. FINDINGS: CT HEAD: BRAIN AND VENTRICLES: Extensive mineralization within the thalami, left basal ganglia, and both cerebellar hemispheres consistent with Fahr disease. No acute intracranial hemorrhage. No mass effect. No midline  shift. No extra-axial fluid collection. No evidence of acute infarct. No hydrocephalus. ORBITS: No acute abnormality. SINUSES AND MASTOIDS: No acute abnormality. CTA NECK: AORTIC ARCH AND ARCH VESSELS: No dissection or arterial injury. No significant stenosis of the brachiocephalic or subclavian arteries. CERVICAL CAROTID ARTERIES: No dissection, arterial injury, or hemodynamically significant stenosis by NASCET criteria. CERVICAL VERTEBRAL ARTERIES: No dissection, arterial injury, or significant stenosis. LUNGS AND MEDIASTINUM: Unremarkable. SOFT TISSUES: No acute abnormality. BONES: No acute abnormality. CTA HEAD: ANTERIOR CIRCULATION: No significant stenosis of the internal carotid arteries. No significant stenosis of the anterior cerebral arteries. No significant stenosis of the middle cerebral arteries. No aneurysm. POSTERIOR CIRCULATION: No significant stenosis of the posterior cerebral arteries. No significant stenosis of the basilar artery. No significant stenosis of the vertebral arteries. No aneurysm. OTHER: No dural venous sinus thrombosis on this non-dedicated study. IMPRESSION: 1. No large vessel occlusion in the head or neck. 2. Extensive mineralization within the thalami, left basal ganglia, and both cerebellar hemispheres consistent with Fahr disease. Electronically signed by: Franky Stanford MD 10/06/2024 01:59 AM EST RP Workstation: [id number]   MR BRAIN W WO CONTRAST Result Date: 10/05/2024 CLINICAL DATA:  Altered  mental status. Gait instability. Communication difficulty. History of suprasellar germinoma and Fahr disease. EXAM: MRI HEAD WITHOUT AND WITH CONTRAST TECHNIQUE: Multiplanar, multiecho pulse sequences of the brain and surrounding structures were obtained without and with intravenous contrast. CONTRAST:  10mL GADAVIST  GADOBUTROL  1 MMOL/ML IV SOLN COMPARISON:  Head CT 10/03/2024.  MRI 05/25/2022. FINDINGS: Brain: Diffusion imaging shows an 8 mm acute infarction in the posterior limb of the internal capsule on the left. No other acute stroke. Chronic lesions of the nuclear regions of both cerebellar hemispheres are unchanged. Old small vessel infarctions of the thalami, basal ganglia and deep white matter are unchanged. Previous surgical approach from a right frontal approach with chronic encephalomalacia. Chronic postsurgical changes in the suprasellar region and hypothalamus are stable. Ventricular size is stable. No extra-axial fluid collection. After contrast administration, no acute brain or leptomeningeal enhancement occurs. No sign of recurrent neoplasm. Vascular: Major vessels at the base of the brain show flow. Skull and upper cervical spine: Otherwise negative Sinuses/Orbits: Clear/normal Other: None IMPRESSION: 1. 8 mm acute infarction in the posterior limb of the internal capsule on the left. 2. Chronic postsurgical changes in the suprasellar region and hypothalamus. No evidence of recurrent neoplasm. 3. Chronic small-vessel ischemic changes elsewhere throughout the brain as outlined above. 4. 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 Electronically Signed   By: Oneil Officer M.D.   On: 10/05/2024 09:43   CT Head Wo Contrast Result Date: 10/03/2024 EXAM: CT HEAD WITHOUT CONTRAST 10/03/2024 05:37:52 PM TECHNIQUE: CT of the head was performed without the administration of intravenous contrast. Automated exposure control, iterative reconstruction, and/or weight based adjustment  of the mA/kV was utilized to reduce the radiation dose to as low as reasonably achievable. COMPARISON: None available. CLINICAL HISTORY: Neuro deficit, acute, stroke suspected FINDINGS: BRAIN AND VENTRICLES: No acute hemorrhage. No evidence of acute infarct. No hydrocephalus. No extra-axial collection. No mass effect or midline shift. Calcification within left greater than right basal ganglia. Bilateral cerebellar calcification. 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 Chronic encephalomalacia or lacunar infarcts are present in the inferior left basal ganglia. Encephalomalacia is present in the anterior right frontal lobe along the prior ventriculostomy catheter tract. Hypothalamic postoperative changes are noted. ORBITS: No acute abnormality. SINUSES: No acute abnormality. SOFT TISSUES AND SKULL: No acute soft tissue  abnormality. Right frontal burr hole for prior ventriculostomy catheter noted. No skull fracture. IMPRESSION: 1. No acute intracranial abnormality. 2. Findings consistent with Fahr disease. 3. Chronic encephalomalacia or lacunar infarcts in the inferior left basal ganglia. 4. Right frontal burr hole for prior ventriculostomy catheter with associated anterior right frontal lobe encephalomalacia. Electronically signed by: Lonni Necessary MD 10/03/2024 05:45 PM EST RP Workstation: [id number]    Labs:  Basic Metabolic Panel: Recent Labs  Lab 10/27/24 0638 10/30/24 0501  NA 140 143  K 3.9 4.1  CL 106 108  CO2 26 28  GLUCOSE 86 86  BUN 13 14  CREATININE 1.12 1.10  CALCIUM  8.6* 9.0    CBC: No results for input(s): WBC, NEUTROABS, HGB, HCT, MCV, PLT in the last 168 hours.  CBG: No results for input(s): GLUCAP in the last 168 hours.  Brief HPI:   DICKSON KOSTELNIK is a 43 y.o. 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  Family reported recent change of DDAVP  from intranasal to oral route and was advised to present to the ED by endocrinology due to concerns of sodium level which was 129 at admission.  CT of the head negative for acute changes and showed right bur hole from prior ventric with anterior right frontal encephalomalacia and chronic encephalomalacia left basal ganglia with findings C/W Fahr disease.  Neurology consulted MRI completed revealing 8 mm acute infarct in posterior limb left internal capsule.  CTA brain was negative for LVO.  Neurology service felt stroke likely related to small vessel radiation related vasculopathy and recommended DAPT x 3 weeks followed by Plavix  alone. He was placed on a fluid restriction briefly by Dr. Faythe of endocrinology services advised against this and DDAVP  dose briefly reduced to 1 mg 3 times daily.  Sodium started trending up and DDAVP  increased to 2 mg 3 times daily.  Therapy evaluations completed due to patient decreased functional mobility was admitted for a comprehensive rehab program.   Hospital Course: ASTER ECKRICH was admitted to rehab 10/10/2024 for inpatient therapies to consist of PT, ST and OT at least three hours five days a week. Past admission physiatrist, therapy team and rehab RN have worked together to provide customized collaborative inpatient rehab.  Pertaining to patient's left inferior basal ganglia infarction/posterior limb of internal capsule remained stable followed by neurology services DAPT x 3 weeks then Plavix  alone.  Mood stabilization doing well was receiving some Ativan  to help aid in sleep.  Panhypopituitary is him followed by endocrinology services DDAVP  as advised as well as hydrocortisone .  Blood pressure controlled and monitored on present regimen would need outpatient follow-up continued with losartan .  Hypernatremia resolved sodium  trending and monitoring with routine blood work.  History of suprasellar germinoma with resultant left hemiparesis cognitive deficits and seizures in the past however unknown seizure medication no further seizures noted.  Mild OSA unable to tolerate CPAP monitoring of oxygen saturations every shift.  He did continue on Rosuvastatin  for hyperlipidemia.  Patient did have some spasticity left upper and left lower extremity left wrist and toe contractures versus spasticity considering Botox injection addition of baclofen  as well as Flexeril  bracing as directed.  Follow-up outpatient.   Blood pressures were monitored on TID basis and remained controlled and monitored      Rehab course:  During patient's stay in rehab weekly team conferences were held to monitor patient's progress, set goals and discuss barriers to discharge. At admission, patient required mod assist sit to stand mod assist sit to supine  He/She  has had improvement in activity tolerance, balance, postural control as well as ability to compensate for deficits. He/She has had improvement in functional use RUE/LUE  and RLE/LLE as well as improvement in awareness       Disposition: Discharge disposition: 06-Home-Health Care Svc        Diet: Regular  Special Instructions: No driving smoking or alcohol  Medications at discharge 1.  Tylenol  as needed 2.  Aspirin  81 mg daily until 11/01/2024 and stop 3.  Baclofen  10 mg twice daily 4.  Plavix  75 mg p.o. daily 5.  Flexeril  5 mg nightly as needed  6.  DDAVP  10 mcg nasal 4 times daily 7.  Cortef  5 mg daily 8.  Synthroid  100 mcg p.o. daily 9.  Ativan  4 mg p.o. nightly 10.  Cozaar  25 mg p.o. daily 11.  Multivitamin daily 12.  Crestor  20 mg daily 13.  Potassium chloride  10 mEq twice daily  30-35 minutes were spent completing discharge summary and discharge planning Discharge Instructions     Ambulatory referral to Physical Medicine Rehab   Complete by: As directed     Moderate complexity follow-up 1 to 2 weeks left basal ganglia CVA        Follow-up Information     Karna Fellows, MD Follow up.   Specialty: Internal Medicine Why: Call in 1-2 days for post hospital follow up Contact information: [address] [place] [address] [phone]         Carilyn Prentice BRAVO, MD. Call.   Specialty: Physical Medicine and Rehabilitation Why: As needed Contact information: [address] [place] [address] [phone]         Faythe Purchase, MD Follow up.   Specialty: Endocrinology Why: Call in 1-2 days for post hospital follow up Contact information: [address] [place] [address] [phone]                 Signed: Toribio PARAS Loralei Radcliffe 10/30/2024, 7:03 PM

## 2024-10-30 NOTE — Progress Notes (Signed)
 Occupational Therapy Session Note  Patient Details  Name: Nicholas Holt MRN: [id number] Date of Birth: 1981-04-18  Today's Date: 10/30/2024 OT Individual Time: 1030-1100 OT Individual Time Calculation (min): 30 min    Short Term Goals: Week 1:  OT Short Term Goal 1 (Week 1): Patient will sit unsupported at edge of bed x 15 seconds as needed to prepare for level surface transfer to chair. OT Short Term Goal 1 - Progress (Week 1): Progressing toward goal ((he is sitting unsupported for 5-10 seconds at the most, unless his R hand is on a stedy surface in front of him with frequent cues to not let go of surface)) OT Short Term Goal 2 (Week 1): Patient will sufficiently lean forward while seated to safely reach to feet during bathing. OT Short Term Goal 2 - Progress (Week 1): Met OT Short Term Goal 3 (Week 1): Patient will transition to standing from sitting with min assist as needed to pull up/down clothing during BADL. OT Short Term Goal 3 - Progress (Week 1): Met Week 2:  OT Short Term Goal 1 (Week 2): Pt will be able to hold static stand for 30 seconds with no posterior lean to increase safety and ability to adjust clothing pre and post toileting. OT Short Term Goal 1 - Progress (Week 2): Met OT Short Term Goal 2 (Week 2): Pt will be able to don pants over feet with supervision. OT Short Term Goal 2 - Progress (Week 2): Met OT Short Term Goal 3 (Week 2): pt will complete toilet transfers with LRAD with CGA to close S. OT Short Term Goal 3 - Progress (Week 2): Met Week 3:  OT Short Term Goal 1 (Week 3): STGs = upgraded LTGs  Skilled Therapeutic Interventions/Progress Updates:    Pt received in hand off from PTA in therapy gym.   Pt's father present and pt agreeable to practicing tub bench transfers in the tub room.   Pt sitting in arm chair, close S to stand and transfer to wc.   Pt taken to room via wc and then pt ambulated from hallway into bathroom to complete transfers with more ease  today as he did not have on his L AFO.  CGA to sit on bench and S to swing legs over wall in and out of tub.  CGA to stand.  Father stated he is comfortable with transfer.  Reviewed use of HH shower and placement of curtain to avoid water spillage.   Pt then taken to gym to work on standing balance at the [place] doing maze tracing,  path finding with numbers. Pt did well with activities but would need cues to not lean back when he got distracted.   Sit and standing at punching bag with punching gloves on.  Good focus on activity with no posterior lean as he was engaged in task.  Full L elbow extension.   Encouraged his father to look into a home boxing bag as this would be a great daily activity for the pt.    Pt returned to room with all needs met. Pt opted to sit in recliner, S with transfer.   Therapy Documentation Precautions:  Precautions Precautions: Fall Recall of Precautions/Restrictions: Impaired Precaution/Restrictions Comments: Left AFO Restrictions Weight Bearing Restrictions Per Provider Order: No   Pain: Pain Assessment Pain Scale: 0-10 Pain Score: 0-No pain    Therapy/Group: Individual Therapy  Anelle Parlow 10/30/2024, 1:07 PM

## 2024-10-30 NOTE — Progress Notes (Signed)
 PROGRESS NOTE   Subjective/Complaints:  No issues overnite  ROS: as per HPI. Denies CP, SOB, abd pain, N/V/D/C, or any other complaints at this time.    Objective:   No results found.  No results for input(s): WBC, HGB, HCT, PLT in the last 72 hours.        Intake/Output Summary (Last 24 hours) at 10/30/2024 0731 Last data filed at 10/30/2024 9378 Gross per 24 hour  Intake 120 ml  Output 525 ml  Net -405 ml         Physical Exam: Vital Signs Blood pressure 134/68, pulse 63, temperature 98.1 F (36.7 C), temperature source Oral, resp. rate 18, height 5' 8 (1.727 m), weight 87.8 kg, SpO2 99%.    General: No acute distress Mood and affect are appropriate Heart: Regular rate and rhythm no rubs murmurs or extra sounds Lungs: Clear to auscultation, breathing unlabored, no rales or wheezes Abdomen: Positive bowel sounds, soft nontender to palpation, nondistended Extremities: No clubbing, cyanosis, or edema Skin: No evidence of breakdown, no evidence of rash  MSK: No tenderness to palpation throughout left lower extremity, dorsal plantar foot, or ankle squeeze.  Full passive range of motion.  No crepitus or deformity.  Neuro: Awake, alert.  Follows commands appropriately. moderately dysarthric,  Somewhat Ataxic with arms as well B/L RUE- 5/5 throughout RLE- 5/5 throughout  LUE- biceps and triceps 3-/5, FF 4/5, FA 4-/5; WE 0 contracted with increased elbow and wrist flexor tone  LLE- HF 3-/5; KE 4-/5; KF 3-/5; DF 2-/5 and PF 2-/5   Tone vs contracture L wrist, -5 to 10 degrees in flexion from neutral, L EF MAS 1+; flexion contractures L toes, MAS 3-4 L PF,   Sensation to LT intact  Minimal limb dysmetria RLE    Assessment/Plan: 1. Functional deficits which require 3+ hours per day of interdisciplinary therapy in a comprehensive inpatient rehab setting. Physiatrist is providing close team  supervision and 24 hour management of active medical problems listed below. Physiatrist and rehab team continue to assess barriers to discharge/monitor patient progress toward functional and medical goals  Care Tool:  Bathing    Body parts bathed by patient: Left arm, Chest, Abdomen, Front perineal area, Right upper leg, Left upper leg, Face, Right arm, Buttocks   Body parts bathed by helper: Right lower leg, Left lower leg     Bathing assist Assist Level: Minimal Assistance - Patient > 75%     Upper Body Dressing/Undressing Upper body dressing   What is the patient wearing?: Pull over shirt    Upper body assist Assist Level: Supervision/Verbal cueing    Lower Body Dressing/Undressing Lower body dressing      What is the patient wearing?: Underwear/pull up, Pants     Lower body assist Assist for lower body dressing: Minimal Assistance - Patient > 75%     Toileting Toileting Toileting Activity did not occur (Clothing management and hygiene only): N/A (no void or bm)  Toileting assist Assist for toileting: Contact Guard/Touching assist     Transfers Chair/bed transfer  Transfers assist     Chair/bed transfer assist level: Contact Guard/Touching assist     Locomotion Ambulation  Ambulation assist      Assist level: Contact Guard/Touching assist Assistive device: No Device Max distance: 150'   Walk 10 feet activity   Assist     Assist level: Contact Guard/Touching assist Assistive device: No Device   Walk 50 feet activity   Assist    Assist level: Contact Guard/Touching assist Assistive device: No Device    Walk 150 feet activity   Assist    Assist level: Contact Guard/Touching assist Assistive device: No Device    Walk 10 feet on uneven surface  activity   Assist     Assist level: Contact Guard/Touching assist Assistive device: Other (comment) (rail)   Wheelchair     Assist Is the patient using a wheelchair?: Yes Type  of Wheelchair: Manual    Wheelchair assist level: Moderate Assistance - Patient 50 - 74% Max wheelchair distance: 150    Wheelchair 50 feet with 2 turns activity    Assist        Assist Level: Moderate Assistance - Patient 50 - 74%   Wheelchair 150 feet activity     Assist      Assist Level: Maximal Assistance - Patient 25 - 49%   Blood pressure 134/68, pulse 63, temperature 98.1 F (36.7 C), temperature source Oral, resp. rate 18, height 5' 8 (1.727 m), weight 87.8 kg, SpO2 99%.  Medical Problem List and Plan: 1. Functional deficits secondary to L inferior basal ganglia stroke (posterior limb of internal capsule)             -patient may  shower             -ELOS/Goals: 12/19 min A to mod I  -Continue CIR Team conf in am   2.  Antithrombotics: -DVT/anticoagulation: Amb 200' no need for prophyllaxis -antiplatelet therapy: DAPT X 3 weeks followed by Plavix  alone-- 3 more days of DAPT  3. Pain Management: Tylenol  and flexeril  prn.   4. Mood/Behavior/Sleep: LCSW to follow for evaluation and support             --ativan  6mg  at nights for sleep/nightmares             -antipsychotic agents: N/A  5. Neuropsych/cognition: This patient is not fully capable of making decisions on his own behalf.  6. Skin/Wound Care: Routine pressure relief measures.   7. Fluids/Electrolytes/Nutrition: Monitor I/O. Routine labs. Continue Klor CR 10mEq BID  8. Panhypopituitarism: On hydrocortisone  5mg  daily, levothyroxine  100mcg daily, and DDAVP  0.2mg  TID-->followed by Dr. Faythe -Currently DDAVP  is 0.1mg  QID  9. Low grade fever: resolved CXR and UA neg  10.  HTN: Monitor BP tid--on Losartan  25mg  daily  -12/15 very good control   Vitals:   10/26/24 0620 10/26/24 1421 10/26/24 1943 10/27/24 0614  BP: 107/66 130/81 124/71 113/82   10/27/24 1354 10/27/24 1949 10/28/24 0526 10/28/24 1634  BP: 136/81 132/85 114/72 133/74   10/28/24 1948 10/29/24 0449 10/29/24 2117 10/30/24 0442  BP:  118/79 105/60 124/69 134/68     11.  Hypernatremia: Resolved Sodium trending back up and DDAVP  increased to 2 mg TID today, Mom prefers intranasal route for pt thinks it controls urination better , will change to home dose and monitor 0.1 QID, (sometimes used .2mg  at noc)   Recheck BMP stable 12/15-- cont nasal DDAVP     Latest Ref Rng & Units 10/30/2024    5:01 AM 10/27/2024    6:38 AM 10/23/2024    6:00 AM  BMP  Glucose 70 - 99 mg/dL 86  86  81   BUN 6 - 20 mg/dL 14  13  14    Creatinine 0.61 - 1.24 mg/dL 8.89  8.87  8.90   Sodium 135 - 145 mmol/L 143  140  139   Potassium 3.5 - 5.1 mmol/L 4.1  3.9  4.0   Chloride 98 - 111 mmol/L 108  106  103   CO2 22 - 32 mmol/L 28  26  30    Calcium  8.9 - 10.3 mg/dL 9.0  8.6  8.8    12. Leucocytosis: resolved   13.  H/o Supersella germinoma: Hx of left hemiparesis, cognitive deficits and seizures in the past (no meds)   14. Mild OSA: Unable to tolerate CPAP  15. Mild constipation- pt goes daily at home- might need to intervene if hasn't gone by tomorrow.   -received MoM 15ml and effective, on senokot S 2tabs daily - LBM 12/13- not documented  16. HLD: continue Rosuvastatin  20mg  daily  17.  Spasticity - LUE and LLE  - Left wrist AND toe contracture vs spasticity consider botulinum toxin injection - discussed with pt and his mother, mom is in favor , pt unsure  - 12/6: Add Baclofen  5 mg TID; has PRN flexeril . Bracing as above - 12/7: Some improvement in spasms, mild improvement in elbow flexor tone.     -12/12: increase baclofen  to 10mg  bid and observe for effect. Could increase to TID this weekend if he tolerates the bid dosing.     S/p Botox 10/29/24, f/u in office   LOS: 20 days A FACE TO FACE EVALUATION WAS PERFORMED  Nicholas Holt 10/30/2024, 7:31 AM

## 2024-10-31 ENCOUNTER — Other Ambulatory Visit (HOSPITAL_COMMUNITY): Payer: Self-pay

## 2024-10-31 MED ORDER — BACLOFEN 10 MG PO TABS
10.0000 mg | ORAL_TABLET | Freq: Two times a day (BID) | ORAL | 0 refills | Status: DC
  Filled 2024-10-31: qty 60, 30d supply, fill #0

## 2024-10-31 MED ORDER — POTASSIUM CHLORIDE ER 10 MEQ PO TBCR: 10.0000 meq | EXTENDED_RELEASE_TABLET | Freq: Two times a day (BID) | ORAL | Status: DC

## 2024-10-31 MED ORDER — SENNOSIDES-DOCUSATE SODIUM 8.6-50 MG PO TABS: 2.0000 | ORAL_TABLET | Freq: Every day | ORAL | 0 refills | Status: DC

## 2024-10-31 MED ORDER — CLOPIDOGREL BISULFATE 75 MG PO TABS: 75.0000 mg | ORAL_TABLET | Freq: Every day | ORAL | 0 refills | Status: DC

## 2024-10-31 MED ORDER — DESMOPRESSIN ACETATE SPRAY 0.01 % NA SOLN: 10.0000 ug | Freq: Four times a day (QID) | NASAL | 0 refills | Status: DC

## 2024-10-31 MED ORDER — CLOPIDOGREL BISULFATE 75 MG PO TABS
75.0000 mg | ORAL_TABLET | Freq: Every day | ORAL | 0 refills | Status: DC
  Filled 2024-10-31: qty 30, 30d supply, fill #0

## 2024-10-31 MED ORDER — BACLOFEN 10 MG PO TABS: 10.0000 mg | ORAL_TABLET | Freq: Two times a day (BID) | ORAL | 0 refills | Status: DC

## 2024-10-31 MED ORDER — SENNOSIDES-DOCUSATE SODIUM 8.6-50 MG PO TABS
2.0000 | ORAL_TABLET | Freq: Every day | ORAL | 0 refills | Status: DC
  Filled 2024-10-31: qty 60, 30d supply, fill #0

## 2024-10-31 MED ORDER — DESMOPRESSIN ACETATE SPRAY 0.01 % NA SOLN: 10.0000 ug | Freq: Four times a day (QID) | NASAL | 0 refills | Status: AC

## 2024-10-31 MED ORDER — CYCLOBENZAPRINE HCL 5 MG PO TABS: 5.0000 mg | ORAL_TABLET | Freq: Every evening | ORAL | 0 refills | Status: DC | PRN

## 2024-10-31 MED ORDER — CYCLOBENZAPRINE HCL 5 MG PO TABS
5.0000 mg | ORAL_TABLET | Freq: Every evening | ORAL | 0 refills | Status: DC | PRN
  Filled 2024-10-31: qty 30, 30d supply, fill #0

## 2024-10-31 MED ORDER — MUSCLE RUB 10-15 % EX CREA: 1.0000 | TOPICAL_CREAM | CUTANEOUS | Status: AC | PRN

## 2024-10-31 MED ORDER — DESMOPRESSIN ACETATE SPRAY 0.01 % NA SOLN
10.0000 ug | Freq: Four times a day (QID) | NASAL | 0 refills | Status: DC
  Filled 2024-10-31: qty 15, 38d supply, fill #0

## 2024-10-31 NOTE — Progress Notes (Signed)
 Speech Language Pathology Discharge Summary  Patient Details  Name: Nicholas Holt MRN: [id number] Date of Birth: Jan 26, 1981  Date of Discharge from SLP service:October 31, 2024  Patient has met 4 of 4 long term goals.  Patient to discharge at overall Min;Mod level.  Reasons goals not met: n/a   Clinical Impression/Discharge Summary:  Good progress noted this stay, as evidenced by meeting 4/4 LTGs. Notably, he demonstrates improved speech intelligibility at sentence level (minA) and utilizes compensatory memory strategies w/ modA to recall recent/relevant info. He benefits from min-modA for cognition and motor speech overall given continued exacerbation of baseline deficits. Pt/family education complete and he is set to d/c home w/ 24-7 supervision. He would benefit from continued ST upon d/c to target remaining exacerbated deficits and facilitate return to baseline cognitive-communication function.   Care Partner:  Caregiver Able to Provide Assistance: Yes  Type of Caregiver Assistance: Cognitive;Physical  Recommendation:  Outpatient SLP;24 hour supervision/assistance  Rationale for SLP Follow Up: Maximize functional communication;Maximize cognitive function and independence;Reduce caregiver burden   Equipment: n/a   Reasons for discharge: Discharged from hospital   Patient/Family Agrees with Progress Made and Goals Achieved: Yes    [name] 10/31/2024, 8:07 AM

## 2024-10-31 NOTE — Plan of Care (Signed)
  Problem: Education: Goal: Knowledge of General Education information will improve Description: Including pain rating scale, medication(s)/side effects and non-pharmacologic comfort measures Outcome: Progressing   Problem: Clinical Measurements: Goal: Ability to maintain clinical measurements within normal limits will improve Outcome: Progressing Goal: Will remain free from infection Outcome: Progressing   Problem: Activity: Goal: Risk for activity intolerance will decrease Outcome: Progressing   Problem: Nutrition: Goal: Adequate nutrition will be maintained Outcome: Progressing   Problem: Elimination: Goal: Will not experience complications related to bowel motility Outcome: Progressing   Problem: Pain Managment: Goal: General experience of comfort will improve and/or be controlled Outcome: Progressing

## 2024-10-31 NOTE — Progress Notes (Signed)
 "                                                        PROGRESS NOTE   Subjective/Complaints:  No issues overnite  ROS: as per HPI. Denies CP, SOB, abd pain, N/V/D/C, or any other complaints at this time.    Objective:   No results found.  No results for input(s): WBC, HGB, HCT, PLT in the last 72 hours.       No intake or output data in the 24 hours ending 10/31/24 0707        Physical Exam: Vital Signs Blood pressure 122/82, pulse 85, temperature 98.1 F (36.7 C), resp. rate 17, height 5' 8 (1.727 m), weight 87.8 kg, SpO2 99%.    General: No acute distress Mood and affect are appropriate Heart: Regular rate and rhythm no rubs murmurs or extra sounds Lungs: Clear to auscultation, breathing unlabored, no rales or wheezes Abdomen: Positive bowel sounds, soft nontender to palpation, nondistended Extremities: No clubbing, cyanosis, or edema Skin: No evidence of breakdown, no evidence of rash  MSK: No tenderness to palpation throughout left lower extremity, dorsal plantar foot, or ankle squeeze.  Full passive range of motion.  No crepitus or deformity.  Neuro: Awake, alert.  Follows commands appropriately. moderately dysarthric,  Somewhat Ataxic with arms as well B/L RUE- 5/5 throughout RLE- 5/5 throughout  LUE- biceps and triceps 3-/5, FF 4/5, FA 4-/5; WE 0 contracted with increased elbow and wrist flexor tone  LLE- HF 3-/5; KE 4-/5; KF 3-/5; DF 2-/5 and PF 2-/5   Tone vs contracture L wrist, -5 to 10 degrees in flexion from neutral, L EF MAS 1+; flexion contractures L toes, MAS 3-4 L PF,       Assessment/Plan: 1. Functional deficits due to Left subcortical infarct Stable for D/C today F/u PCP in 3-4 weeks F/u PM&R 6 weeks F/u endo 4-6wks F/u neuro 1-2 mo See D/C summary See D/C instructions   Care Tool:  Bathing    Body parts bathed by patient: Left arm, Chest, Abdomen, Front perineal area, Right upper leg, Left upper leg, Face, Right  arm, Buttocks, Right lower leg, Left lower leg   Body parts bathed by helper: Right lower leg, Left lower leg     Bathing assist Assist Level: Contact Guard/Touching assist     Upper Body Dressing/Undressing Upper body dressing   What is the patient wearing?: Pull over shirt    Upper body assist Assist Level: Set up assist    Lower Body Dressing/Undressing Lower body dressing      What is the patient wearing?: Underwear/pull up, Pants     Lower body assist Assist for lower body dressing: Contact Guard/Touching assist     Toileting Toileting Toileting Activity did not occur (Clothing management and hygiene only): N/A (no void or bm)  Toileting assist Assist for toileting: Contact Guard/Touching assist     Transfers Chair/bed transfer  Transfers assist     Chair/bed transfer assist level: Contact Guard/Touching assist     Locomotion Ambulation   Ambulation assist      Assist level: Contact Guard/Touching assist Assistive device: No Device Max distance: 200   Walk 10 feet activity   Assist     Assist level: Contact Guard/Touching assist Assistive device: No Device   Walk  50 feet activity   Assist    Assist level: Contact Guard/Touching assist Assistive device: No Device    Walk 150 feet activity   Assist    Assist level: Contact Guard/Touching assist Assistive device: No Device    Walk 10 feet on uneven surface  activity   Assist     Assist level: Contact Guard/Touching assist Assistive device: Other (comment) (rail)   Wheelchair     Assist Is the patient using a wheelchair?: Yes Type of Wheelchair: Manual    Wheelchair assist level: Dependent - Patient 0% Max wheelchair distance: 150    Wheelchair 50 feet with 2 turns activity    Assist        Assist Level: Dependent - Patient 0%   Wheelchair 150 feet activity     Assist      Assist Level: Dependent - Patient 0%   Blood pressure 122/82, pulse 85,  temperature 98.1 F (36.7 C), resp. rate 17, height 5' 8 (1.727 m), weight 87.8 kg, SpO2 99%.  Medical Problem List and Plan: 1. Functional deficits secondary to L inferior basal ganglia stroke (posterior limb of internal capsule)          D/C home today    2.  Antithrombotics: -DVT/anticoagulation: Amb 200' no need for prophyllaxis -antiplatelet therapy: DAPT X 3 weeks followed by Plavix  alone-- 3 more days of DAPT  3. Pain Management: Tylenol  and flexeril  prn.   4. Mood/Behavior/Sleep: LCSW to follow for evaluation and support             --ativan  6mg  at nights for sleep/nightmares             -antipsychotic agents: N/A  5. Neuropsych/cognition: This patient is not fully capable of making decisions on his own behalf.  6. Skin/Wound Care: Routine pressure relief measures.   7. Fluids/Electrolytes/Nutrition: Monitor I/O. Routine labs. Continue Klor CR 10mEq BID  8. Panhypopituitarism: On hydrocortisone  5mg  daily, levothyroxine  100mcg daily, and DDAVP  0.2mg  TID-->followed by Dr. Faythe -Currently DDAVP  is 0.1mg  QID  9. Low grade fever: resolved CXR and UA neg  10.  HTN: Monitor BP tid--on Losartan  25mg  daily  -12/15 very good control   Vitals:   10/26/24 1943 10/27/24 0614 10/27/24 1354 10/27/24 1949  BP: 124/71 113/82 136/81 132/85   10/28/24 0526 10/28/24 1634 10/28/24 1948 10/29/24 0449  BP: 114/72 133/74 118/79 105/60   10/29/24 2117 10/30/24 0442 10/30/24 1544 10/30/24 2120  BP: 124/69 134/68 137/78 122/82     11.  Hypernatremia: Resolved Sodium trending back up and DDAVP  increased to 2 mg TID today, Mom prefers intranasal route for pt thinks it controls urination better , will change to home dose and monitor 0.1 QID, (sometimes used .2mg  at noc)   Recheck BMP stable 12/15-- cont nasal DDAVP     Latest Ref Rng & Units 10/30/2024    5:01 AM 10/27/2024    6:38 AM 10/23/2024    6:00 AM  BMP  Glucose 70 - 99 mg/dL 86  86  81   BUN 6 - 20 mg/dL 14  13  14    Creatinine  0.61 - 1.24 mg/dL 8.89  8.87  8.90   Sodium 135 - 145 mmol/L 143  140  139   Potassium 3.5 - 5.1 mmol/L 4.1  3.9  4.0   Chloride 98 - 111 mmol/L 108  106  103   CO2 22 - 32 mmol/L 28  26  30    Calcium  8.9 - 10.3 mg/dL 9.0  8.6  8.8    12. Leucocytosis: resolved   13.  H/o Supersella germinoma: Hx of left hemiparesis, cognitive deficits and seizures in the past (no meds)   14. Mild OSA: Unable to tolerate CPAP  15. Mild constipation- pt goes daily at home- might need to intervene if hasn't gone by tomorrow.   -received MoM 15ml and effective, on senokot S 2tabs daily - LBM 12/13- not documented  16. HLD: continue Rosuvastatin  20mg  daily  17.  Spasticity - LUE and LLE  - Left wrist AND toe contracture vs spasticity consider botulinum toxin injection - discussed with pt and his mother, mom is in favor , pt unsure  - 12/6: Add Baclofen  5 mg TID; has PRN flexeril . Bracing as above - 12/7: Some improvement in spasms, mild improvement in elbow flexor tone.     -12/12: increase baclofen  to 10mg  bid and observe for effect. Could increase to TID this weekend if he tolerates the bid dosing.     S/p Botox 10/29/24, f/u in office   LOS: 21 days A FACE TO FACE EVALUATION WAS PERFORMED  Nicholas Holt 10/31/2024, 7:07 AM     "

## 2024-10-31 NOTE — Plan of Care (Signed)
" °  Problem: RH Cognition - SLP Goal: RH LTG Patient will demonstrate orientation with cues Description:  LTG:  Patient will demonstrate orientation to person/place/time/situation with cues (SLP)   Outcome: Completed/Met   Problem: RH Expression Communication Goal: LTG Patient will increase speech intelligibility (SLP) Description: LTG: Patient will increase speech intelligibility at word/phrase/conversation level with cues, % of the time (SLP) Outcome: Completed/Met   Problem: RH Problem Solving Goal: LTG Patient will demonstrate problem solving for (SLP) Description: LTG:  Patient will demonstrate problem solving for basic/complex daily situations with cues  (SLP) Outcome: Completed/Met   Problem: RH Memory Goal: LTG Patient will use memory compensatory aids to (SLP) Description: LTG:  Patient will use memory compensatory aids to recall biographical/new, daily complex information with cues (SLP) Outcome: Completed/Met   "

## 2024-10-31 NOTE — Progress Notes (Addendum)
 Patient ID: Nicholas Holt, male   DOB: 23-Apr-1981, 43 y.o.   MRN: [id number] Family has decided they want a wheelchair have placed order with [employer] hopefully will not take long to deliver this am  8:57 AM Mom has decided she wants OP and wanted [facility] since closer to them. Have contacted [facility] and they only provide PT which was discussed will need to refer to a Neuro-rehab. [facility] is closer to them. Will ask Dan-PA to place referral for OPPT, OT and SP and they will reach out to mom to set up follow up appointments

## 2024-11-03 ENCOUNTER — Ambulatory Visit: Admitting: Speech Pathology

## 2024-11-03 ENCOUNTER — Ambulatory Visit: Admitting: Physical Therapy

## 2024-11-03 ENCOUNTER — Other Ambulatory Visit: Payer: Self-pay

## 2024-11-03 ENCOUNTER — Ambulatory Visit: Admitting: Occupational Therapy

## 2024-11-03 ENCOUNTER — Encounter: Payer: Self-pay | Admitting: Occupational Therapy

## 2024-11-03 ENCOUNTER — Encounter: Payer: Self-pay | Admitting: Physical Therapy

## 2024-11-03 ENCOUNTER — Encounter: Payer: Self-pay | Admitting: Speech Pathology

## 2024-11-03 DIAGNOSIS — M25632 Stiffness of left wrist, not elsewhere classified: Secondary | ICD-10-CM | POA: Insufficient documentation

## 2024-11-03 DIAGNOSIS — I639 Cerebral infarction, unspecified: Secondary | ICD-10-CM

## 2024-11-03 DIAGNOSIS — R41842 Visuospatial deficit: Secondary | ICD-10-CM | POA: Diagnosis present

## 2024-11-03 DIAGNOSIS — R278 Other lack of coordination: Secondary | ICD-10-CM | POA: Diagnosis present

## 2024-11-03 DIAGNOSIS — R41840 Attention and concentration deficit: Secondary | ICD-10-CM | POA: Diagnosis present

## 2024-11-03 DIAGNOSIS — M6281 Muscle weakness (generalized): Secondary | ICD-10-CM

## 2024-11-03 DIAGNOSIS — R41841 Cognitive communication deficit: Secondary | ICD-10-CM | POA: Diagnosis present

## 2024-11-03 DIAGNOSIS — R41844 Frontal lobe and executive function deficit: Secondary | ICD-10-CM | POA: Insufficient documentation

## 2024-11-03 DIAGNOSIS — R471 Dysarthria and anarthria: Secondary | ICD-10-CM | POA: Insufficient documentation

## 2024-11-03 DIAGNOSIS — M25622 Stiffness of left elbow, not elsewhere classified: Secondary | ICD-10-CM | POA: Diagnosis present

## 2024-11-03 DIAGNOSIS — R2689 Other abnormalities of gait and mobility: Secondary | ICD-10-CM | POA: Insufficient documentation

## 2024-11-03 DIAGNOSIS — R29818 Other symptoms and signs involving the nervous system: Secondary | ICD-10-CM | POA: Insufficient documentation

## 2024-11-03 NOTE — Therapy (Signed)
 " OUTPATIENT PHYSICAL THERAPY NEURO EVALUATION   Patient Name: Nicholas Holt MRN: [id number] DOB:[birth year], 43 y.o., male Today's Date: 11/03/2024   PCP: Ronnald Sergeant, MD REFERRING PROVIDER: Toribio Pitch, PA-C  END OF SESSION:  PT End of Session - 11/03/24 1628     Visit Number 1    Number of Visits 14    Date for Recertification  01/12/25    Progress Note Due on Visit 10    PT Start Time 1400    PT Stop Time 1447    PT Time Calculation (min) 47 min    Equipment Utilized During Treatment Gait belt    Activity Tolerance Patient tolerated treatment well    Behavior During Therapy Impulsive;WFL for tasks assessed/performed;Restless          Past Medical History:  Diagnosis Date   Anxiety    Attention deficit disorder 08/08/2006   Bilateral leg cramps 11/25/2006   Blood transfusion without reported diagnosis 1997   Chemotherapy associated   Brain cancer (HCC) 1998   Cough 12/08/2015   COVID-19 10/16/2017   Elevated hemidiaphragm 07/09/2015   Left, presumably after portacath placement.    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  Complicated by left sided motor tic disorder and panhypopituitarism.    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  Includes central diabetes insipidus, secondary hypothyroidism, secondary adrenal insufficiency, and secondary hypogonadism.    Perennial allergic rhinitis 07/09/2015   Pre-diabetes    no  meds   Preventative health care 07/09/2015   Seizure disorder (HCC) 08/08/2006   Predated brain tumor.  Generalized seizure ocurred after running out of lorazepam  post-tumor.    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 Treated with alendronate  from 2009-2016. DEXA (03/16/2008): L Spine T -2.0. L fem neck T -0.9, R fem neck T -1.9.  DEXA (10/15/2014): L Spine T -1.3, L fem neck T -0.6, R fem neck T -1.8.  Bisphosphonate holiday 06/2015.  Repeat DEXA scan in 10/2016 to reassess.    Weakness 09/15/2023   Past Surgical History:  Procedure Laterality Date   BRAIN BIOPSY  11/14/1995   CSF SHUNT  11/14/1995   PORTACATH PLACEMENT     portacath removal     RADIOLOGY WITH ANESTHESIA N/A 08/30/2021   Procedure: MRI WITH ANESTHESIA BRAIN WITH AND WITHOUT CONTRAST;  Surgeon: Radiologist, Medication, MD;  Location: [facility] OR;  Service: Radiology;  Laterality: N/A;   RADIOLOGY WITH ANESTHESIA N/A 05/25/2022   Procedure: MRI WITH ANESTHESIA OF BRAIN WITH AND WITHOUT CONTRAST;  Surgeon: Radiologist, Medication, MD;  Location: [facility] OR;  Service: Radiology;  Laterality: N/A;   RADIOLOGY WITH ANESTHESIA N/A 10/05/2024   Procedure: MRI WITH ANESTHESIA;  Surgeon: Radiologist, Medication, MD;  Location: [facility] OR;  Service: Radiology;  Laterality: N/A;   Patient Active Problem List   Diagnosis Date Noted   Left basal ganglia embolic stroke (HCC) 10/10/2024   Small vessel stroke (HCC) 10/10/2024   Gait abnormality 10/03/2024   Diarrhea 07/08/2024   Functional  urinary incontinence 07/08/2024   Class 1 obesity with serious comorbidity and body mass index (BMI) of 30.0 to 30.9 in adult 04/23/2023   Prediabetes 10/10/2022   Elevated CK 10/10/2022   OSA (obstructive sleep apnea) 07/06/2022   Multiple lacunar infarcts (HCC) 05/04/2022   Hypertension 12/09/2019   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   Elevated hemidiaphragm 07/09/2015   Perennial allergic rhinitis 07/09/2015    H/O malignant neoplasm of brain 09/22/2014   Steroid-induced osteoporosis 03/27/2008   Bilateral leg cramps 11/25/2006   Panhypopituitarism of anterior and posterior pituitary 08/08/2006   Hyperlipidemia 08/08/2006   Attention deficit disorder 08/08/2006   Seizure disorder (HCC) 08/08/2006    ONSET DATE: 10/03/24  REFERRING DIAG: I63.9 (ICD-10-CM) - Left basal ganglia embolic stroke (HCC)  THERAPY DIAG:  Other abnormalities of gait and mobility  Muscle weakness (generalized)  Left basal ganglia embolic stroke (HCC)  Rationale for Evaluation and Treatment: Rehabilitation  SUBJECTIVE:                                                                                                                                                                                             SUBJECTIVE STATEMENT: Pt presents with his mother for PT eval. States that on 10/03/24 she noted decreased balance, pt attempting to use the wall for balance and was decreased from prior functional baseline. Pt denies pain in the last week, not likely to report per mother, but she does not believe he has been having any pain. Inquires about [name] shoe, already ordered, pt has slide plate on L toe of the shoe to assist with mitigating fall risk. Pt ind with cog assist prior to CVA, requires CGA for walking currently. Trialed hemiwalker, not indicated with prior PT.  Pt accompanied by: family member  PERTINENT HISTORY:   From Acute Care Eval: 10/06/24 Pt admitted with above diagnosis. At baseline, pt ambulatory without AD and enjoys going to the gym and bowling.  He does live with mother and father who assist with iadls and other task due to some cognitive and mobility deficits from prior CVA but still is fairly independent.  Pt with L hand contracture and L ankle contracture wearing AFO from prior CVA after brain tumor.  Today, pt requiring mod-max A for transfers due to posterior lean.  He did ambulate 40' with PT but  with mod A and unsafe gait pattern requiring frequent cues and stopping to regain balance and get close to RW, AFO in place.  Pt's strength difficult to assess completely due to unable to balance while sitting EOB, does appear to be slightly weaker on L compared  to R - however, that may be baseline.  From CIR Eval: 10/11/24 Nicholas Holt is a 43 year old male with history of suprasellar germinoma s/p chemo and XRT '97 with residual Left hemiplegia cognitive deficits, panhypopituitarism, anxiety d/o, ADD who was admitted on 10/03/24 with 4 day history of difficulty walking and worsening of dysarthria. Family reported recent change of DDAVP  from intranasal to oral route and was advised to present to ED by endocrinology due to concerns of sodium level which was 129 at admission. CT head negative for acute changes and showed right burr hole from prior ventric with anterior right frontal encephalomalacia and chronic encephalomalacia left basal ganglia with finding c/w Fahr disease. Neurology consulted and MRI brain done revealing 8 mm acute infarct in posterior limb left internal capsule. CTA brain was negative for LVO. Dr. Jerri felt that stroke likely related to small vessel radiation related vasculopathy and recommended DAPT X 3 week followed by Plavix  alone.  PT note 10/30/24 Pt performs sit to stand and ambulates x50' to toilet with CGA and no AD with cues to increase Lt stride length to improve reciprocal gait pattern and balance. Following, pt ambulates x50' back to [facility] with same cues. Pt completes multiple additional bouts of ambulation to work on endurance, balance, and coordination, completing x100', x150', and x75' with seated rest breaks. Pt also completes 4 6 steps with Rt handrail and cues for safe step sequencing and positioning, with CGA provided overall.    PAIN:  Are you having pain? No  PRECAUTIONS: Fall and Other: Left sided weakness and hemiplegia   RED FLAGS: None   WEIGHT BEARING  RESTRICTIONS: No  FALLS: Has patient fallen in last 6 months? No  LIVING ENVIRONMENT: Lives with: lives with their family Lives in: House/apartment Stairs: Yes: Internal: flight steps; on right going up and External: 3 steps; none Has following equipment at home: Single point cane  PLOF: Independent  PATIENT GOALS: return to PLOF, helping with groceries, bowling  OBJECTIVE:  Note: Objective measures were completed at Evaluation unless otherwise noted.  DIAGNOSTIC FINDINGS:   IMPRESSION: 10/05/24 1. 8 mm acute infarction in the posterior limb of the internal capsule on the left. 2. Chronic postsurgical changes in the suprasellar region and hypothalamus. No evidence of recurrent neoplasm. 3. Chronic small-vessel ischemic changes elsewhere throughout the brain as outlined above. 4. 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  COGNITION: Overall cognitive status: History of cognitive impairments - at baseline   SENSATION: WFL  COORDINATION: Decreased with inc velocity of movements bilaterally, L>R, finger/nose, finger/finger, stepping pattern in sitting (R, L, R, L) able to improve with decreased cadence  EDEMA:  Slight increase in edema of the L ankle, retromalleolar  MUSCLE TONE: LLE: left flexion contracture of L wrist, slight inc from baseline. Mild hypertonicity in LLE with quick movements, WNL if moves slowly    DTRs:  Biceps 1 = Trace , Brachioradialis 1 = Trace , Triceps 1 = Trace , Patella 1 = Trace , and Achilles 1 = Trace   POSTURE: rounded shoulders, forward head, weight shift right, and weight shift left  LOWER EXTREMITY ROM:     Active  Right Eval Left Eval  Hip flexion WNL 114  Hip extension    Hip abduction 43 40  Hip adduction WNL WNL  Hip internal rotation    Hip external rotation    Knee flexion 125 118  Knee extension -4 -10  Ankle dorsiflexion 19 12  Ankle plantarflexion 30 26  Ankle inversion    Ankle  eversion     (Blank rows = not tested)  LOWER EXTREMITY MMT:    MMT Right Eval Left Eval  Hip flexion 4/5 3+/5  Hip extension    Hip abduction 4+/5 3/5  Hip adduction 4+/5 4+/5  Hip internal rotation    Hip external rotation    Knee flexion 4/5 3-/5  Knee extension 4/5 3/5  Ankle dorsiflexion 3+/5 2/5  Ankle plantarflexion 3+/5 3/5  Ankle inversion    Ankle eversion    (Blank rows = not tested)   TRANSFERS: Sit to stand: SBA  Assistive device utilized: None      GAIT: Findings: Distance walked: 15ft, Assistive device utilized:Walker - 2 wheeled and None, Level of assistance: CGA, and Comments: frequent cues for pacing as pt is impulsive and attempted to complete increased cadence, with fatigue he is unable to maintain LLE demands from velocity, safety awareness deficits place him at inc risk of falls. Pt cued for step height and clearance during LLE swing phase, unable to maintain without cues and unable to maintain with inc fatigue. Cued for pacing at this point with improved safety taking smaller steps.                                                                                                                               TREATMENT DATE:   11/03/24- EVAL    PATIENT EDUCATION: Education details: Pt educated on relevant anatomy, physiology, pathology, diagnosis, prognosis, progression of care, pain and activity modification related to s/p CVA 10/03/24 Person educated: Patient and Parent Education method: Explanation, Demonstration, and Handouts Education comprehension: verbalized understanding, returned demonstration, verbal cues required, and needs further education  HOME EXERCISE PROGRAM: Access Code: [email or web address] URL: [email or web address] Date: 11/03/2024 Prepared by: Nicholas Holt  Exercises - Seated Hip Flexion March with Ankle Weights  - 2 x daily - 7 x weekly - 3 sets - 15 reps - 2 hold - Seated Long Arc Quad  - 2 x daily - 7 x weekly - 3 sets  - 15 reps - 2 hold - Seated Heel Raise  - 2 x daily - 7 x weekly - 3 sets - 15 reps - 2 hold  GOALS: Goals reviewed with patient? Yes  SHORT TERM GOALS: Target date: 12/11/24   Pt will report compliance with HEP to work towards ind and home management strategies Baseline: Goal status: INITIAL   2.  Pt will amb x389ft with LRAD at supervision assist or less to demonstrate improved activity tolerance and safety with home mobility Baseline:  Goal status: INITIAL   3.  Pt will improve L LE AROM to full and painless in order to demonstrate progress towards activity tolerance and improved function Baseline:  Goal status: INITIAL  4.  Pt will demonstrate stair negotiation using non reciprocal pattern x1 flight with right handrail per home setup with CGA and demonstrate safety  recall requiring caregiver presence to assist with steps Baseline: 2 steps into the house, mod A  Goal status: INITIAL    LONG TERM GOALS: Target date: 01/12/25   Pt will amb x749ft with LRAD at supervision assist or less to demonstrate improved activity tolerance and safety with home mobility Baseline:  Goal status: INITIAL   2.  Pt will demonstrate 4/5 strength or greater in LLE to demonstrate improved strength and stability Baseline:  Goal status: INITIAL   3.  Pt will be ind in the management of their symptoms at home and in the community Baseline:  Goal status: INITIAL   4.  Pt will complete unilateral carry of 5 pounds or greater a distance of 142ft to mimic assisting with groceries  Baseline:  Goal status: INITIAL     ASSESSMENT:  CLINICAL IMPRESSION: Patient is a 43 y.o. M who was seen today for physical therapy evaluation and treatment for s/p CVA 10/03/24 with L sided deficits. Pt has baseline deficits in the L hemisphere due to brain tumor at age 59. Prior to this event, he was able to participate in grocery shopping and home activities, amb without AD. Pt currently demonstrates inc impulsivity  with safety awareness concerns requiring frequent cues. Pt and family educated on maintained safety. Pt stands to benefit from continued skilled physical therapy to address deficit areas and restore safety with activities and participations at home and in the community.     OBJECTIVE IMPAIRMENTS: Abnormal gait, decreased activity tolerance, decreased balance, decreased cognition, decreased coordination, decreased endurance, decreased mobility, difficulty walking, decreased ROM, decreased strength, decreased safety awareness, increased edema, impaired tone, impaired UE functional use, impaired vision/preception, and postural dysfunction.   ACTIVITY LIMITATIONS: carrying, lifting, bending, standing, squatting, stairs, bathing, dressing, self feeding, and locomotion level  PARTICIPATION LIMITATIONS: shopping and community activity  PERSONAL FACTORS: Behavior pattern, Fitness, Past/current experiences, and Time since onset of injury/illness/exacerbation are also affecting patient's functional outcome.   REHAB POTENTIAL: Good  CLINICAL DECISION MAKING: Evolving/moderate complexity  EVALUATION COMPLEXITY: Moderate  PLAN:  PT FREQUENCY: 1-2x/week  PT DURATION: 10 weeks  PLANNED INTERVENTIONS: 97110-Therapeutic exercises, 97530- Therapeutic activity, 97112- Neuromuscular re-education, [phone]- Self Care, [id number]- Manual therapy, [phone]- Gait training, [phone]- Orthotic Initial, [phone]- Orthotic/Prosthetic subsequent, [id number]- Electrical stimulation (unattended), Patient/Family education, and Wheelchair mobility training  PLAN FOR NEXT SESSION: progress motor control, trial two wheeled walker with built up L handle to accommodate flexor contracture, gait and balance, strength, stability, mobility, endurance through functional movement patterns   Nicholas DELENA Holt, PT 11/03/2024, 4:58 PM        "

## 2024-11-03 NOTE — Therapy (Unsigned)
 " OUTPATIENT SPEECH LANGUAGE PATHOLOGY EVALUATION   Patient Name: Nicholas Holt MRN: [id number] DOB:11/30/1980, 43 y.o., male Today's Date: 11/03/2024  PCP: Karna Fellows, MD REFERRING PROVIDER: Pegge Toribio PARAS, PA-C  END OF SESSION:  End of Session - 11/03/24 1238     Visit Number 1    Number of Visits 17    Date for Recertification  01/04/25    SLP Start Time 1230    SLP Stop Time  1310    SLP Time Calculation (min) 40 min    Activity Tolerance Patient tolerated treatment well          Past Medical History:  Diagnosis Date   Anxiety    Attention deficit disorder 08/08/2006   Bilateral leg cramps 11/25/2006   Blood transfusion without reported diagnosis 1997   Chemotherapy associated   Brain cancer (HCC) 1998   Cough 12/08/2015   COVID-19 10/16/2017   Elevated hemidiaphragm 07/09/2015   Left, presumably after portacath placement.    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  Complicated by left sided motor tic disorder and panhypopituitarism.    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  Includes central diabetes insipidus, secondary hypothyroidism, secondary adrenal insufficiency, and secondary hypogonadism.    Perennial allergic rhinitis 07/09/2015   Pre-diabetes    no meds   Preventative health care 07/09/2015   Seizure disorder (HCC) 08/08/2006   Predated brain tumor.  Generalized seizure ocurred after running out of  lorazepam  post-tumor.    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 Treated with alendronate  from 2009-2016. DEXA (03/16/2008): L Spine T -2.0. L fem neck T -0.9, R fem neck T -1.9.  DEXA (10/15/2014): L Spine T -1.3, L fem neck T -0.6, R fem neck T -1.8.  Bisphosphonate holiday 06/2015.  Repeat DEXA scan in 10/2016 to reassess.    Weakness 09/15/2023   Past Surgical History:  Procedure Laterality Date   BRAIN BIOPSY  11/14/1995   CSF SHUNT  11/14/1995   PORTACATH PLACEMENT     portacath removal     RADIOLOGY WITH ANESTHESIA N/A 08/30/2021   Procedure: MRI WITH ANESTHESIA BRAIN WITH AND WITHOUT CONTRAST;  Surgeon: Radiologist, Medication, MD;  Location: [facility] OR;  Service: Radiology;  Laterality: N/A;   RADIOLOGY WITH ANESTHESIA N/A 05/25/2022   Procedure: MRI WITH ANESTHESIA OF BRAIN WITH AND WITHOUT CONTRAST;  Surgeon: Radiologist, Medication, MD;  Location: [facility] OR;  Service: Radiology;  Laterality: N/A;   RADIOLOGY WITH ANESTHESIA N/A 10/05/2024   Procedure: MRI WITH ANESTHESIA;  Surgeon: Radiologist, Medication, MD;  Location: [facility] OR;  Service: Radiology;  Laterality: N/A;   Patient Active Problem List   Diagnosis Date Noted   Left basal ganglia embolic stroke (HCC) 10/10/2024   Small vessel stroke (HCC) 10/10/2024   Gait abnormality 10/03/2024   Diarrhea 07/08/2024   Functional urinary incontinence 07/08/2024   Class 1 obesity with serious comorbidity and body mass index (BMI) of 30.0 to 30.9 in adult 04/23/2023   Prediabetes 10/10/2022  Elevated CK 10/10/2022   OSA (obstructive sleep apnea) 07/06/2022   Multiple lacunar infarcts (HCC) 05/04/2022   Hypertension 12/09/2019   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   Elevated hemidiaphragm 07/09/2015   Perennial allergic rhinitis 07/09/2015   H/O malignant neoplasm of brain 09/22/2014   Steroid-induced osteoporosis 03/27/2008   Bilateral leg cramps 11/25/2006   Panhypopituitarism of anterior  and posterior pituitary 08/08/2006   Hyperlipidemia 08/08/2006   Attention deficit disorder 08/08/2006   Seizure disorder (HCC) 08/08/2006    ONSET DATE: Referred on 10/31/24   REFERRING DIAG: I63.9 (ICD-10-CM) - Left basal ganglia embolic stroke (HCC)   THERAPY DIAG:  Cognitive communication deficit  Dysarthria and anarthria  Rationale for Evaluation and Treatment: Rehabilitation  SUBJECTIVE:   SUBJECTIVE STATEMENT: Pt was pleasant and cooperative throughout evaluation.   Pt accompanied by: self  PERTINENT HISTORY: brain CA 1997, CVA  PAIN:  Are you having pain? No  FALLS: Has patient fallen in last 6 months?  No  LIVING ENVIRONMENT: Lives with: lives with their family; [place] Lives in: House/apartment  PLOF:  Level of assistance: Needed assistance with ADLs, Needed assistance with IADLS Employment: On disability; since CA @ 15  PATIENT GOALS: speech  OBJECTIVE:  Note: Objective measures were completed at Evaluation unless otherwise noted.  DIAGNOSTIC FINDINGS: :Per EMR  MR BRAIN W WO CONTRAST  Date: 10/04/2024   IMPRESSION: 1. 8 mm acute infarction in the posterior limb of the internal capsule on the left. 2. Chronic postsurgical changes in the suprasellar region and hypothalamus. No evidence of recurrent neoplasm. 3. Chronic small-vessel ischemic changes elsewhere throughout the brain as outlined above. 4. 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  COGNITION: Overall cognitive status: Impaired: Areas of impairment:  Attention: Impaired: Selective, Alternating, Divided Memory: Impaired: Short term Prospective Auditory Awareness: Impaired: Emergent Executive function: Impaired: Impulse control, Problem solving, Organization, Planning, Error awareness, Self-correction, and Slow processing  and History of cognitive impairments - at baseline Functional deficits: See clinical impression  COGNITIVE  COMMUNICATION: Following directions: Follows multi-step commands inconsistently  Auditory comprehension: WFL Verbal expression: WFL Functional communication: Impaired: recall of information in conversations   ORAL MOTOR EXAMINATION: Overall status: Impaired;  Comments: Facial ROM: Reduced left;Suspected CN VII (facial) dysfunction Facial Symmetry: Abnormal symmetry left;Suspected CN VII (facial) dysfunction Lingual ROM: Other (Comment) (imprecise) Lingual Symmetry: Within Functional Limits   MOTOR SPEECH: Overall motor speech: impaired Level of impairment: Phrase, Sentence, and Conversation Respiration: thoracic breathing Phonation: normal Resonance: WFL Articulation: Impaired: phrase, sentence, and conversation Intelligibility: Intelligibility reduced ~ 25% to unfamiliar listener Motor planning: Appears intact  Effective technique: slow rate, increased vocal intensity, pause, and pacing   STANDARDIZED ASSESSMENTS:   Cognitive Linguistic Quick Test: AGE - 18 - 69   The Cognitive Linguistic Quick Test (CLQT) was administered to assess the relative status of five cognitive domains: attention, memory, language, executive functioning, and visuospatial skills. Scores from 10 tasks were used to estimate severity ratings (standardized for age groups 18-69 years and 70-89 years) for each domain, a clock drawing task, as well as an overall composite severity rating of cognition.       Task Score Criterion Cut Scores  Personal Facts 8/8 8  Symbol Cancellation 9/12 11  Confrontation Naming -/10 10  Clock Drawing  6/13 12  Story Retelling 4/10 6  Symbol Trails 1/10 9  Generative Naming 3 4 /9 5  Design Memory -/6 5  Mazes  0/8 7  Design Generation -/  13 6    PATIENT REPORTED OUTCOME MEASURES (PROM):                                                                                                                             TREATMENT DATE:     PATIENT EDUCATION: Education  details: cognitive-communication Person educated: Patient and Parent Education method: Explanation Education comprehension: verbalized understanding   GOALS: Goals reviewed with patient? Yes  SHORT TERM GOALS: Target date: 12/05/24  Complete PROM, testing, and update goals Baseline: Goal status: INITIAL  2.  Pt will verbalize 2 strategies to improve speech intelligibility.  Baseline:  Goal status: INITIAL  3.  Pt will recall the following Mneumonic  STPD (STOP THINK PLAN DO).  Baseline:  Goal status: INITIAL  4.   Baseline:  Goal status: INITIAL  5.   Baseline:  Goal status: INITIAL  6.   Baseline:  Goal status: INITIAL  LONG TERM GOALS: Target date: 01/05/25  Pt will improve score on PROM Baseline:  Goal status: INITIAL  2.  Pt/mom will report use of external memory strategies  Baseline:  Goal status: INITIAL  3.  Pt will demonstrate use of 1 speech intelligibility strategy in structured conversation with minA verbal cues.  Baseline:  Goal status: INITIAL  4.   Baseline:  Goal status: INITIAL  5.   Baseline:  Goal status: INITIAL  6.   Baseline:  Goal status: INITIAL  ASSESSMENT:  CLINICAL IMPRESSION: Pt is a 43 yo male who presents to [facility] OP for evaluation post CVA. Pt was accompanied by mother for today's evaluation. Pt with baseline deficits following brain CA in 1997. Mom reports changes in thinking skills post CVA re: impulsivity (specifically with eating/movement), short term memory, and focus, as well as, and speech.  Pt was assessed using CLQT and informal speech measures. Pt speech observed to be around 25% intelligible to an unfamiliar listener and c/b re: rapid rate, imprecise articulation. SLP attempted to pace patient with asking close-ended questions, which appeared to be helpful. Pt required cueing to wait during assessment, as pt would try to take pen from therapist hand while she was explaining/demonstrating directions.  He also  benefited from repetition throughout evaluation. SLP rec skilled ST services to address dysarthria and cognitive-communication impairment.    OBJECTIVE IMPAIRMENTS: include attention, memory, awareness, executive functioning, and dysarthria. These impairments are limiting patient from managing medications, managing appointments, managing finances, household responsibilities, ADLs/IADLs, and effectively communicating at home and in community. Factors affecting potential to achieve goals and functional outcome are ability to learn/carryover information 2/2 to memory impairment. Patient will benefit from skilled SLP services to address above impairments and improve overall function.  REHAB POTENTIAL: Fair 2/2 to memory impairment; however, great caregiver support.   PLAN:  SLP FREQUENCY: 2x/week  SLP DURATION: 8 weeks  PLANNED INTERVENTIONS: Environmental controls, Cueing hierachy, SLP instruction and feedback, Compensatory strategies, Patient/family education, and 07492 Treatment of speech (30 or 45 min)  Lacinda KANDICE Lorenzo, CCC-SLP 11/03/2024, 12:39 PM          "

## 2024-11-03 NOTE — Therapy (Signed)
 " OUTPATIENT OCCUPATIONAL THERAPY NEURO EVALUATION  Patient Name: Nicholas Holt MRN: [id number] DOB:Apr 01, 1981, 43 y.o., male Today's Date: 11/03/2024  PCP: Karna Fellows MD REFERRING PROVIDER: Dr. Carilyn  END OF SESSION:  OT End of Session - 11/03/24 1659     Visit Number 1    Number of Visits 25    Date for Recertification  01/26/25    Authorization Type [employer] until January then [facility]    OT Start Time 1321    OT Stop Time 1400    OT Time Calculation (min) 39 min          Past Medical History:  Diagnosis Date   Anxiety    Attention deficit disorder 08/08/2006   Bilateral leg cramps 11/25/2006   Blood transfusion without reported diagnosis 1997   Chemotherapy associated   Brain cancer (HCC) 1998   Cough 12/08/2015   COVID-19 10/16/2017   Elevated hemidiaphragm 07/09/2015   Left, presumably after portacath placement.    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  Complicated by left sided motor tic disorder and panhypopituitarism.    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  Includes central diabetes insipidus, secondary hypothyroidism, secondary adrenal insufficiency, and secondary hypogonadism.    Perennial allergic rhinitis 07/09/2015   Pre-diabetes    no meds   Preventative health care 07/09/2015   Seizure disorder (HCC) 08/08/2006   Predated brain tumor.  Generalized seizure ocurred after running out of lorazepam   post-tumor.    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 Treated with alendronate  from 2009-2016. DEXA (03/16/2008): L Spine T -2.0. L fem neck T -0.9, R fem neck T -1.9.  DEXA (10/15/2014): L Spine T -1.3, L fem neck T -0.6, R fem neck T -1.8.  Bisphosphonate holiday 06/2015.  Repeat DEXA scan in 10/2016 to reassess.    Weakness 09/15/2023   Past Surgical History:  Procedure Laterality Date   BRAIN BIOPSY  11/14/1995   CSF SHUNT  11/14/1995   PORTACATH PLACEMENT     portacath removal     RADIOLOGY WITH ANESTHESIA N/A 08/30/2021   Procedure: MRI WITH ANESTHESIA BRAIN WITH AND WITHOUT CONTRAST;  Surgeon: Radiologist, Medication, MD;  Location: [facility] OR;  Service: Radiology;  Laterality: N/A;   RADIOLOGY WITH ANESTHESIA N/A 05/25/2022   Procedure: MRI WITH ANESTHESIA OF BRAIN WITH AND WITHOUT CONTRAST;  Surgeon: Radiologist, Medication, MD;  Location: [facility] OR;  Service: Radiology;  Laterality: N/A;   RADIOLOGY WITH ANESTHESIA N/A 10/05/2024   Procedure: MRI WITH ANESTHESIA;  Surgeon: Radiologist, Medication, MD;  Location: [facility] OR;  Service: Radiology;  Laterality: N/A;   Patient Active Problem List   Diagnosis Date Noted   Left basal ganglia embolic stroke (HCC) 10/10/2024   Small vessel stroke (HCC) 10/10/2024   Gait abnormality 10/03/2024   Diarrhea 07/08/2024   Functional urinary incontinence 07/08/2024   Class 1 obesity with serious comorbidity and body mass index (BMI) of 30.0 to 30.9 in adult 04/23/2023   Prediabetes 10/10/2022  Elevated CK 10/10/2022   OSA (obstructive sleep apnea) 07/06/2022   Multiple lacunar infarcts (HCC) 05/04/2022   Hypertension 12/09/2019   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   Elevated hemidiaphragm 07/09/2015   Perennial allergic rhinitis 07/09/2015   H/O malignant neoplasm of brain 09/22/2014   Steroid-induced osteoporosis 03/27/2008   Bilateral leg cramps 11/25/2006   Panhypopituitarism of anterior and  posterior pituitary 08/08/2006   Hyperlipidemia 08/08/2006   Attention deficit disorder 08/08/2006   Seizure disorder (HCC) 08/08/2006    ONSET DATE: 10/31/24- referral date  REFERRING DIAG:  I63.9 (ICD-10-CM) - Left basal ganglia embolic stroke (HCC)    THERAPY DIAG:  Other abnormalities of gait and mobility - Plan: Ot plan of care cert/re-cert  Other lack of coordination - Plan: Ot plan of care cert/re-cert  Muscle weakness (generalized) - Plan: Ot plan of care cert/re-cert  Visuospatial deficit - Plan: Ot plan of care cert/re-cert  Attention and concentration deficit - Plan: Ot plan of care cert/re-cert  Other symptoms and signs involving the nervous system - Plan: Ot plan of care cert/re-cert  Stiffness of left wrist, not elsewhere classified - Plan: Ot plan of care cert/re-cert  Stiffness of left elbow, not elsewhere classified - Plan: Ot plan of care cert/re-cert  Frontal lobe and executive function deficit - Plan: Ot plan of care cert/re-cert  Rationale for Evaluation and Treatment: Rehabilitation  SUBJECTIVE:   SUBJECTIVE STATEMENT: Pt denies pain Pt accompanied by: family member  PERTINENT HISTORY: 43 yo M hospitalized 10/04/24 with impaired balance with posterior limb internal capsule infarct. PMH: suprasellar germinoma s/p chemo with left-sided motor disorder, HTN, HLD anxiety, ADD, ataxia, seizure disorder, OSA Pt d/c from CIR  Pt had LUE weakness from braintumor however pt's mom reports weakness is worse since CVA PRECAUTIONS:seizures, fall risk    PAIN:  Are you having pain? no  FALLS: Has patient fallen in last 6 months? No  LIVING ENVIRONMENT: Lives with: lives with their family Lives in: House/apartment Stairs: yes flight  Has following equipment at home: Shower bench  PLOF: Independent with household mobility with device and Needs assistance with ADLs  PATIENT GOALS: improve independence  OBJECTIVE:  Note: Objective measures were  completed at Evaluation unless otherwise noted.  HAND DOMINANCE: Right  ADLs:Pt required some assist with ADLs prior to CVA Overall ADLs: Pt/ mom are currrently sleeping downstairs as she is concerned he will wake up and go downstairs on his own Transfers/ambulation related to ADLs: Eating: mod I  Grooming: supervision UB Dressing: dependent LB Dressing: dependent Toileting: min A Bathing: dependent Tub Shower transfers: has not tried has tub transfer bench Equipment: Transfer tub bench  IADLs:Dependent, PTA pt brought in groceries and vacuumed  Medication management: requires assistance Financial management: dependent    MOBILITY STATUS: minguard/ min a without device  Sitting balance: posterior lean   UPPER EXTREMITY ROM:  LUE weakness  Active ROM Right eval Left eval  Shoulder flexion 115 75  Shoulder abduction    Shoulder adduction    Shoulder extension    Shoulder internal rotation    Shoulder external rotation    Elbow flexion  130  Elbow extension  -40  Wrist flexion  45 at rest, to 65 flexion  Wrist extension  -45,( -20 P/ROM)  Wrist ulnar deviation    Wrist radial deviation    Wrist pronation    Wrist supination    (Blank rows = not tested)    HAND FUNCTION: Grip strength: Right: 32 lbs; Left: 15  lbs  COORDINATION: Box and Blocks:  Right 34blocks, Left 33blocks  SENSATION: Not tested   MUSCLE TONE: LUE: Moderate and Hypertonic, history of spasticity from brain tumor, recent botox while hospitalized   COGNITION: Overall cognitive status: History of cognitive impairments - at baseline    VISION ASSESSMENT: To be further assessed in functional context, pt walks close to wall on right side      OBSERVATIONS: Impulsive gentleman accompanied by his mother                                                                                                                             TREATMENT DATE: 11/03/24- eval         PATIENT  EDUCATION: Education details: role of OT, potential goals Person educated: Patient and Parent Education method: Explanation Education comprehension: verbalized understanding  HOME EXERCISE PROGRAM: n/a   GOALS: Goals reviewed with patient? Yes  SHORT TERM GOALS: Target date: 12/04/24  I with HEP Baseline: Goal status: INITIAL  2.  Pt will consistently donn shirt  with set up Baseline:  Goal status: INITIAL  3.  Pt/ caregiver will be I with positioning to minimize risk for LUE contracture Baseline:  Goal status: INITIAL  4.  Pt will donn pants with min A Baseline:  Goal status: INITIAL  5.  Pt will increase bilateral UE functional use as evidenced by increasing box/ blocks score by 3 blocks bilaterally Baseline:  Goal status: INITIAL  6.  Further assess vision and set goal prn. Baseline:  Goal status: INITIAL  LONG TERM GOALS: Target date: 01/26/25  I with updated HEP Baseline:  Goal status: INITIAL  2.  Pt will increase RUE grip strength to 37 # or greater for increased functional use. Baseline:  Goal status: INITIAL  3.  Pt will increase RUE shoulder flexion to 120 for increased functional reach. Baseline:  Goal status: INITIAL  4.  Pt will increase L shoulder flexion to 85* for increased ease with dressing. Baseline:  Goal status: INITIAL  5.  Pt will perform dressing with supervision/ setup Baseline:  Goal status: INITIAL  6.  Pt will perfrom bathing with min A Baseline:  Goal status: INITIAL  ASSESSMENT:  CLINICAL IMPRESSION: Patient is a 43 y.o. male who was seen today for occupational therapy evaluation for  I63.9 (ICD-10-CM) - Left basal ganglia embolic stroke (HCC)  Pt presents with the performance deficits below. He can benefit from skilled occupational therapy to address these deficits in order to maximize pt's safety and independence with ADLS/IADLS  PERFORMANCE DEFICITS: in functional skills including ADLs, IADLs, coordination,  dexterity, sensation, ROM, strength, pain, flexibility, Fine motor control, Gross motor control, mobility, balance, endurance, decreased knowledge of precautions, decreased knowledge of use of DME, and UE functional use, cognitive skills including attention, learn, problem solving, safety awareness, thought, and understand, and psychosocial skills including coping strategies, environmental adaptation, habits, interpersonal interactions, and routines and behaviors.   IMPAIRMENTS: are limiting  patient from ADLs, IADLs, rest and sleep, play, leisure, and social participation.   CO-MORBIDITIES: may have co-morbidities  that affects occupational performance. Patient will benefit from skilled OT to address above impairments and improve overall function.  MODIFICATION OR ASSISTANCE TO COMPLETE EVALUATION: Min-Moderate modification of tasks or assist with assess necessary to complete an evaluation.  OT OCCUPATIONAL PROFILE AND HISTORY: Detailed assessment: Review of records and additional review of physical, cognitive, psychosocial history related to current functional performance.  CLINICAL DECISION MAKING: Moderate - several treatment options, min-mod task modification necessary  REHAB POTENTIAL: Good  EVALUATION COMPLEXITY: Moderate    PLAN:  OT FREQUENCY: 2x/week  OT DURATION: 12 weeks  PLANNED INTERVENTIONS: 97168 OT Re-evaluation, 97535 self care/ADL training, 02889 therapeutic exercise, 97530 therapeutic activity, 97112 neuromuscular re-education, 97140 manual therapy, 97035 ultrasound, 97018 paraffin, 02989 moist heat, 97010 cryotherapy, 97129 Cognitive training (first 15 min), 02869 Cognitive training(each additional 15 min), 02249 Physical Performance Testing, 02239 Orthotic Initial, 97763 Orthotic/Prosthetic subsequent, passive range of motion, balance training, functional mobility training, visual/perceptual remediation/compensation, energy conservation, coping strategies training,  patient/family education, and DME and/or AE instructions  RECOMMENDED OTHER SERVICES: n/a  CONSULTED AND AGREED WITH PLAN OF CARE: Patient and family member/caregiver  PLAN FOR NEXT SESSION: inital HEP stretching for LUE, grip strength for RUE   Yaire Kreher, OT 11/03/2024, 5:19 PM           "

## 2024-11-10 ENCOUNTER — Encounter: Payer: Self-pay | Admitting: Occupational Therapy

## 2024-11-10 ENCOUNTER — Encounter: Payer: Self-pay | Admitting: Speech Pathology

## 2024-11-10 ENCOUNTER — Other Ambulatory Visit: Payer: Self-pay | Admitting: Internal Medicine

## 2024-11-10 ENCOUNTER — Ambulatory Visit: Admitting: Speech Pathology

## 2024-11-10 ENCOUNTER — Ambulatory Visit: Admitting: Occupational Therapy

## 2024-11-10 DIAGNOSIS — M25622 Stiffness of left elbow, not elsewhere classified: Secondary | ICD-10-CM

## 2024-11-10 DIAGNOSIS — R29818 Other symptoms and signs involving the nervous system: Secondary | ICD-10-CM

## 2024-11-10 DIAGNOSIS — R2689 Other abnormalities of gait and mobility: Secondary | ICD-10-CM

## 2024-11-10 DIAGNOSIS — M25632 Stiffness of left wrist, not elsewhere classified: Secondary | ICD-10-CM

## 2024-11-10 DIAGNOSIS — M6281 Muscle weakness (generalized): Secondary | ICD-10-CM

## 2024-11-10 DIAGNOSIS — R41841 Cognitive communication deficit: Secondary | ICD-10-CM

## 2024-11-10 DIAGNOSIS — R278 Other lack of coordination: Secondary | ICD-10-CM

## 2024-11-10 DIAGNOSIS — R471 Dysarthria and anarthria: Secondary | ICD-10-CM

## 2024-11-10 DIAGNOSIS — R41842 Visuospatial deficit: Secondary | ICD-10-CM

## 2024-11-10 DIAGNOSIS — R41840 Attention and concentration deficit: Secondary | ICD-10-CM

## 2024-11-10 NOTE — Telephone Encounter (Signed)
 Medication sent to pharmacy

## 2024-11-10 NOTE — Therapy (Signed)
 " OUTPATIENT OCCUPATIONAL THERAPY NEURO EVALUATION  Patient Name: Nicholas Holt MRN: [id number] DOB:03-19-1981, 43 y.o., male Today's Date: 11/10/2024  PCP: Karna Fellows MD REFERRING PROVIDER: Dr. Carilyn  END OF SESSION:  OT End of Session - 11/10/24 0848     Visit Number 2    Number of Visits 25    Date for Recertification  01/26/25    Authorization Type [employer] until January then [facility]    Authorization - Visit Number 2    OT Start Time [phone]    OT Stop Time 1015    OT Time Calculation (min) 39 min          Past Medical History:  Diagnosis Date   Anxiety    Attention deficit disorder 08/08/2006   Bilateral leg cramps 11/25/2006   Blood transfusion without reported diagnosis 1997   Chemotherapy associated   Brain cancer (HCC) 1998   Cough 12/08/2015   COVID-19 10/16/2017   Elevated hemidiaphragm 07/09/2015   Left, presumably after portacath placement.    Fall 04/13/2022   H/O malignant neoplasm of brain 09/22/2014   Suprasellar germinoma treated at [facility] in 1997; received chemotherapy (cisplatin, etoposide, vincristine, and cyclophosphomide) and radiation therapy.  Complicated by left sided motor tic disorder and panhypopituitarism.    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  Includes central diabetes insipidus, secondary hypothyroidism, secondary adrenal insufficiency, and secondary hypogonadism.    Perennial allergic rhinitis 07/09/2015   Pre-diabetes    no meds   Preventative health care 07/09/2015   Seizure disorder (HCC) 08/08/2006   Predated brain tumor.  Generalized seizure ocurred  after running out of lorazepam  post-tumor.    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 Treated with alendronate  from 2009-2016. DEXA (03/16/2008): L Spine T -2.0. L fem neck T -0.9, R fem neck T -1.9.  DEXA (10/15/2014): L Spine T -1.3, L fem neck T -0.6, R fem neck T -1.8.  Bisphosphonate holiday 06/2015.  Repeat DEXA scan in 10/2016 to reassess.    Weakness 09/15/2023   Past Surgical History:  Procedure Laterality Date   BRAIN BIOPSY  11/14/1995   CSF SHUNT  11/14/1995   PORTACATH PLACEMENT     portacath removal     RADIOLOGY WITH ANESTHESIA N/A 08/30/2021   Procedure: MRI WITH ANESTHESIA BRAIN WITH AND WITHOUT CONTRAST;  Surgeon: Radiologist, Medication, MD;  Location: [facility] OR;  Service: Radiology;  Laterality: N/A;   RADIOLOGY WITH ANESTHESIA N/A 05/25/2022   Procedure: MRI WITH ANESTHESIA OF BRAIN WITH AND WITHOUT CONTRAST;  Surgeon: Radiologist, Medication, MD;  Location: [facility] OR;  Service: Radiology;  Laterality: N/A;   RADIOLOGY WITH ANESTHESIA N/A 10/05/2024   Procedure: MRI WITH ANESTHESIA;  Surgeon: Radiologist, Medication, MD;  Location: [facility] OR;  Service: Radiology;  Laterality: N/A;   Patient Active Problem List   Diagnosis Date Noted   Left basal ganglia embolic stroke (HCC) 10/10/2024   Small vessel stroke (HCC) 10/10/2024   Gait abnormality 10/03/2024   Diarrhea 07/08/2024   Functional urinary incontinence 07/08/2024   Class 1 obesity with serious comorbidity and body mass index (BMI) of 30.0 to 30.9 in  adult 04/23/2023   Prediabetes 10/10/2022   Elevated CK 10/10/2022   OSA (obstructive sleep apnea) 07/06/2022   Multiple lacunar infarcts (HCC) 05/04/2022   Hypertension 12/09/2019   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   Elevated hemidiaphragm 07/09/2015   Perennial allergic rhinitis 07/09/2015   H/O malignant neoplasm of brain 09/22/2014   Steroid-induced osteoporosis 03/27/2008   Bilateral leg cramps 11/25/2006    Panhypopituitarism of anterior and posterior pituitary 08/08/2006   Hyperlipidemia 08/08/2006   Attention deficit disorder 08/08/2006   Seizure disorder (HCC) 08/08/2006    ONSET DATE: 10/31/24- referral date  REFERRING DIAG:  I63.9 (ICD-10-CM) - Left basal ganglia embolic stroke (HCC)    THERAPY DIAG:  Other abnormalities of gait and mobility  Muscle weakness (generalized)  Other lack of coordination  Visuospatial deficit  Attention and concentration deficit  Other symptoms and signs involving the nervous system  Stiffness of left wrist, not elsewhere classified  Stiffness of left elbow, not elsewhere classified  Rationale for Evaluation and Treatment: Rehabilitation  SUBJECTIVE:   SUBJECTIVE STATEMENT: Pt denies pain Pt accompanied by: family member  PERTINENT HISTORY: 43 yo M hospitalized 10/04/24 with impaired balance with posterior limb internal capsule infarct. PMH: suprasellar germinoma s/p chemo with left-sided motor disorder, HTN, HLD anxiety, ADD, ataxia, seizure disorder, OSA Pt d/c from CIR  Pt had LUE weakness from braintumor however pt's mom reports weakness is worse since CVA PRECAUTIONS:seizures, fall risk    PAIN:  Are you having pain? no  FALLS: Has patient fallen in last 6 months? No  LIVING ENVIRONMENT: Lives with: lives with their family Lives in: House/apartment Stairs: yes flight  Has following equipment at home: Shower bench  PLOF: Independent with household mobility with device and Needs assistance with ADLs  PATIENT GOALS: improve independence  OBJECTIVE:  Note: Objective measures were completed at Evaluation unless otherwise noted.  HAND DOMINANCE: Right  ADLs:Pt required some assist with ADLs prior to CVA Overall ADLs: Pt/ mom are currrently sleeping downstairs as she is concerned he will wake up and go downstairs on his own Transfers/ambulation related to ADLs: Eating: mod I  Grooming: supervision UB Dressing:  dependent LB Dressing: dependent Toileting: min A Bathing: dependent Tub Shower transfers: has not tried has tub transfer bench Equipment: Transfer tub bench  IADLs:Dependent, PTA pt brought in groceries and vacuumed  Medication management: requires assistance Financial management: dependent    MOBILITY STATUS: minguard/ min a without device  Sitting balance: posterior lean   UPPER EXTREMITY ROM:  LUE weakness  Active ROM Right eval Left eval  Shoulder flexion 115 75  Shoulder abduction    Shoulder adduction    Shoulder extension    Shoulder internal rotation    Shoulder external rotation    Elbow flexion  130  Elbow extension  -40  Wrist flexion  45 at rest, to 65 flexion  Wrist extension  -45,( -20 P/ROM)  Wrist ulnar deviation    Wrist radial deviation    Wrist pronation    Wrist supination    (Blank rows = not tested)    HAND FUNCTION: Grip strength: Right: 32 lbs; Left: 15 lbs  COORDINATION: Box and Blocks:  Right 34blocks, Left 33blocks  SENSATION: Not tested   MUSCLE TONE: LUE: Moderate and Hypertonic, history of spasticity from brain tumor, recent botox while hospitalized   COGNITION: Overall cognitive status: History of cognitive impairments - at baseline    VISION ASSESSMENT: To be further assessed in functional context, pt walks close to  wall on right side      OBSERVATIONS: Impulsive gentleman accompanied by his mother                                                                                                                             TREATMENT DATE:  11/10/24- Pt/ mom report pt donned pants with supervision today. Pt/ mom were instructed in red putty HEP for composite grip and tip pinch, mod v.c and demonstration for bilateral UE's Cane exercises for shoulder flexion, chest press and abduction to right side, mod v.c and facilitation. Placing and removing graded clothespins from container for sustained pinch 1-8# with right  and left UE's mod v.c Pt has a old today and he wore a msks for treatment.   11/03/24- eval         PATIENT EDUCATION: Education details red theraputty, cane exercises- chest press and shoulder flexion issued for home Person educated: Patient and Parent Education method: Explanation, demonstration, v.c, handout Education comprehension: verbalized understanding, returned demonstration, v.c  HOME EXERCISE PROGRAM:  11/10/24- red putty, cane ex   GOALS: Goals reviewed with patient? Yes  SHORT TERM GOALS: Target date: 12/04/24  I with HEP Baseline: Goal status:  ongoing 11/10/24  2.  Pt will consistently donn shirt  with set up Baseline:  Goal status: ongoing 11/10/24  3.  Pt/ caregiver will be I with positioning to minimize risk for LUE contracture Baseline:  Goal status: INITIAL  4.  Pt will donn pants with min A Baseline:  Goal status: ongoing, pt's mom rpeorts supervison today, will monitor for consistency 11/10/24  5.  Pt will increase bilateral UE functional use as evidenced by increasing box/ blocks score by 3 blocks bilaterally Baseline:  Goal status: INITIAL  6.  Further assess vision and set goal prn. Baseline:  Goal status: INITIAL  LONG TERM GOALS: Target date: 01/26/25  I with updated HEP Baseline:  Goal status: INITIAL  2.  Pt will increase RUE grip strength to 37 # or greater for increased functional use. Baseline:  Goal status: INITIAL  3.  Pt will increase RUE shoulder flexion to 120 for increased functional reach. Baseline:  Goal status: INITIAL  4.  Pt will increase L shoulder flexion to 85* for increased ease with dressing. Baseline:  Goal status: INITIAL  5.  Pt will perform dressing with supervision/ setup Baseline:  Goal status: INITIAL  6.  Pt will perfrom bathing with min A Baseline:  Goal status: INITIAL  ASSESSMENT:  CLINICAL IMPRESSION: Patient / mom were instructed in inital HEP. Pt requires assistance and v.c for  performance.  PERFORMANCE DEFICITS: in functional skills including ADLs, IADLs, coordination, dexterity, sensation, ROM, strength, pain, flexibility, Fine motor control, Gross motor control, mobility, balance, endurance, decreased knowledge of precautions, decreased knowledge of use of DME, and UE functional use, cognitive skills including attention, learn, problem solving, safety awareness, thought, and understand, and psychosocial skills including coping strategies, environmental adaptation, habits, interpersonal  interactions, and routines and behaviors.   IMPAIRMENTS: are limiting patient from ADLs, IADLs, rest and sleep, play, leisure, and social participation.   CO-MORBIDITIES: may have co-morbidities  that affects occupational performance. Patient will benefit from skilled OT to address above impairments and improve overall function.  MODIFICATION OR ASSISTANCE TO COMPLETE EVALUATION: Min-Moderate modification of tasks or assist with assess necessary to complete an evaluation.  OT OCCUPATIONAL PROFILE AND HISTORY: Detailed assessment: Review of records and additional review of physical, cognitive, psychosocial history related to current functional performance.  CLINICAL DECISION MAKING: Moderate - several treatment options, min-mod task modification necessary  REHAB POTENTIAL: Good  EVALUATION COMPLEXITY: Moderate    PLAN:  OT FREQUENCY: 2x/week  OT DURATION: 12 weeks  PLANNED INTERVENTIONS: 97168 OT Re-evaluation, 97535 self care/ADL training, 02889 therapeutic exercise, 97530 therapeutic activity, 97112 neuromuscular re-education, 97140 manual therapy, 97035 ultrasound, 97018 paraffin, 02989 moist heat, 97010 cryotherapy, 97129 Cognitive training (first 15 min), 02869 Cognitive training(each additional 15 min), 02249 Physical Performance Testing, 02239 Orthotic Initial, 97763 Orthotic/Prosthetic subsequent, passive range of motion, balance training, functional mobility training,  visual/perceptual remediation/compensation, energy conservation, coping strategies training, patient/family education, and DME and/or AE instructions  RECOMMENDED OTHER SERVICES: n/a  CONSULTED AND AGREED WITH PLAN OF CARE: Patient and family member/caregiver  PLAN FOR NEXT SESSION: ADL strategies, review HEP   Kamill Fulbright, OT 11/10/2024, 9:52 AM           "

## 2024-11-10 NOTE — Patient Instructions (Signed)
 Grip Strengthening (Resistive Putty)    Squeeze putty using thumb and all fingers. Repeat _10-20___ times. Do ___1_ sessions per day.   Copyright  VHI. All rights reserved.  Finger / Thumb Activities: Extension    Roll putty into rope shape using all fingers held straight. Hitchhike with thumb up and out.   Pinch: Palmar    Pinch putty with right thumb and each fingertip in turn. Repeat __10-20__ times. Do __1__ sessions per day. Activity: Peel fruit such as lemons or oranges.* Peel stickers off surfaces.        ROM: Abduction - Wand   Sit Holding wand with left hand palm up, push wand directly out to side, leading with other hand palm down, until stretch is felt. Hold 5 seconds. Repeat 10 times per set. Do 2-3 sessions per day.  SCAPULA: Retraction    Hold cane with both hands. Pinch shoulder blades together. Do not shrug shoulders. Hold __5_ seconds.  __10_ reps per set, _1__ sets per day, __7_ days per week  Copyright  VHI. All rights reserved.    SHOULDER: Flexion - Sitting    Hold cane with both hands. Raise arms up. Keep elbows straight. Hold _5__ seconds. Use no weight on cane. 10-20___ reps per set, __1_ sets per day, ___ days per week  Copyright  VHI. All rights reserved.     Copyright  VHI. All rights reserved.

## 2024-11-10 NOTE — Therapy (Signed)
 " OUTPATIENT SPEECH LANGUAGE PATHOLOGY TREATMENT   Patient Name: Nicholas Holt MRN: [id number] DOB:[birth year], 43 y.o., male Today's Date: 11/10/2024  PCP: Karna Fellows, MD REFERRING PROVIDER: Pegge Toribio PARAS, PA-C  END OF SESSION:  End of Session - 11/10/24 0853     Visit Number 2    Number of Visits 17    Date for Recertification  01/04/25    SLP Start Time 0845    SLP Stop Time  0925    SLP Time Calculation (min) 40 min    Activity Tolerance Patient tolerated treatment well          Past Medical History:  Diagnosis Date   Anxiety    Attention deficit disorder 08/08/2006   Bilateral leg cramps 11/25/2006   Blood transfusion without reported diagnosis 1997   Chemotherapy associated   Brain cancer (HCC) 1998   Cough 12/08/2015   COVID-19 10/16/2017   Elevated hemidiaphragm 07/09/2015   Left, presumably after portacath placement.    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  Complicated by left sided motor tic disorder and panhypopituitarism.    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  Includes central diabetes insipidus, secondary hypothyroidism, secondary adrenal insufficiency, and secondary hypogonadism.    Perennial allergic rhinitis 07/09/2015   Pre-diabetes    no meds   Preventative health care 07/09/2015   Seizure disorder (HCC) 08/08/2006   Predated brain tumor.  Generalized seizure ocurred after running out of  lorazepam  post-tumor.    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 Treated with alendronate  from 2009-2016. DEXA (03/16/2008): L Spine T -2.0. L fem neck T -0.9, R fem neck T -1.9.  DEXA (10/15/2014): L Spine T -1.3, L fem neck T -0.6, R fem neck T -1.8.  Bisphosphonate holiday 06/2015.  Repeat DEXA scan in 10/2016 to reassess.    Weakness 09/15/2023   Past Surgical History:  Procedure Laterality Date   BRAIN BIOPSY  11/14/1995   CSF SHUNT  11/14/1995   PORTACATH PLACEMENT     portacath removal     RADIOLOGY WITH ANESTHESIA N/A 08/30/2021   Procedure: MRI WITH ANESTHESIA BRAIN WITH AND WITHOUT CONTRAST;  Surgeon: Radiologist, Medication, MD;  Location: [facility] OR;  Service: Radiology;  Laterality: N/A;   RADIOLOGY WITH ANESTHESIA N/A 05/25/2022   Procedure: MRI WITH ANESTHESIA OF BRAIN WITH AND WITHOUT CONTRAST;  Surgeon: Radiologist, Medication, MD;  Location: [facility] OR;  Service: Radiology;  Laterality: N/A;   RADIOLOGY WITH ANESTHESIA N/A 10/05/2024   Procedure: MRI WITH ANESTHESIA;  Surgeon: Radiologist, Medication, MD;  Location: [facility] OR;  Service: Radiology;  Laterality: N/A;   Patient Active Problem List   Diagnosis Date Noted   Left basal ganglia embolic stroke (HCC) 10/10/2024   Small vessel stroke (HCC) 10/10/2024   Gait abnormality 10/03/2024   Diarrhea 07/08/2024   Functional urinary incontinence 07/08/2024   Class 1 obesity with serious comorbidity and body mass index (BMI) of 30.0 to 30.9 in adult 04/23/2023   Prediabetes 10/10/2022  Elevated CK 10/10/2022   OSA (obstructive sleep apnea) 07/06/2022   Multiple lacunar infarcts (HCC) 05/04/2022   Hypertension 12/09/2019   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   Elevated hemidiaphragm 07/09/2015   Perennial allergic rhinitis 07/09/2015   H/O malignant neoplasm of brain 09/22/2014   Steroid-induced osteoporosis 03/27/2008   Bilateral leg cramps 11/25/2006   Panhypopituitarism of anterior  and posterior pituitary 08/08/2006   Hyperlipidemia 08/08/2006   Attention deficit disorder 08/08/2006   Seizure disorder (HCC) 08/08/2006    ONSET DATE: Referred on 10/31/24   REFERRING DIAG: I63.9 (ICD-10-CM) - Left basal ganglia embolic stroke (HCC)   THERAPY DIAG:  Cognitive communication deficit  Dysarthria and anarthria  Rationale for Evaluation and Treatment: Rehabilitation  SUBJECTIVE:   SUBJECTIVE STATEMENT: Pt is feeling sick today (runny nose).   Pt accompanied by: self  PERTINENT HISTORY: brain CA 1997, CVA  PAIN:  Are you having pain? No  FALLS: Has patient fallen in last 6 months?  No  LIVING ENVIRONMENT: Lives with: lives with their family; [place] and [place] Lives in: House/apartment  PLOF:  Level of assistance: Needed assistance with ADLs, Needed assistance with IADLS Employment: On disability; since CA @ 15  PATIENT GOALS: speech  OBJECTIVE:  Note: Objective measures were completed at Evaluation unless otherwise noted.  DIAGNOSTIC FINDINGS: :Per EMR  MR BRAIN W WO CONTRAST  Date: 10/04/2024   IMPRESSION: 1. 8 mm acute infarction in the posterior limb of the internal capsule on the left. 2. Chronic postsurgical changes in the suprasellar region and hypothalamus. No evidence of recurrent neoplasm. 3. Chronic small-vessel ischemic changes elsewhere throughout the brain as outlined above. 4. 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  COGNITION: Overall cognitive status: Impaired: Areas of impairment:  Attention: Impaired: Selective, Alternating, Divided Memory: Impaired: Short term Prospective Auditory Awareness: Impaired: Emergent Executive function: Impaired: Impulse control, Problem solving, Organization, Planning, Error awareness, Self-correction, and Slow processing  and History of cognitive impairments - at baseline Functional deficits: See clinical impression  COGNITIVE COMMUNICATION: Following  directions: Follows multi-step commands inconsistently  Auditory comprehension: WFL Verbal expression: WFL Functional communication: Impaired: recall of information in conversations   ORAL MOTOR EXAMINATION: Overall status: Impaired;  Comments: Facial ROM: Reduced left;Suspected CN VII (facial) dysfunction Facial Symmetry: Abnormal symmetry left;Suspected CN VII (facial) dysfunction Lingual ROM: Other (Comment) (imprecise) Lingual Symmetry: Within Functional Limits   MOTOR SPEECH: Overall motor speech: impaired Level of impairment: Phrase, Sentence, and Conversation Respiration: thoracic breathing Phonation: normal Resonance: WFL Articulation: Impaired: phrase, sentence, and conversation Intelligibility: Intelligibility reduced ~ 25% to unfamiliar listener Motor planning: Appears intact  Effective technique: slow rate, increased vocal intensity, pause, and pacing   STANDARDIZED ASSESSMENTS:   Cognitive Linguistic Quick Test: AGE - 18 - 69   The Cognitive Linguistic Quick Test (CLQT) was administered to assess the relative status of five cognitive domains: attention, memory, language, executive functioning, and visuospatial skills. Scores from 10 tasks were used to estimate severity ratings (standardized for age groups 18-69 years and 70-89 years) for each domain, a clock drawing task, as well as an overall composite severity rating of cognition.       Task Score Criterion Cut Scores  Personal Facts 8/8 8  Symbol Cancellation 9/12 11  Confrontation Naming 10/10 10  Clock Drawing  6/13 12  Story Retelling 4/10 6  Symbol Trails 1/10 9  Generative Naming 3/9 5  Design Memory 2/6 5  Mazes  0/8 7  Design Generation 3/13 6  Cognitive Domain Composite Score Severity Rating  Attention 99/215 Moderate  Memory 103/185 Severe  Executive Function 9/40 Severe  Language 25/37 Mild  Visuospatial Skills 35/105 Severe  Clock Drawing  6/13 Severe  Composite Severity Rating   Moderate    PATIENT REPORTED OUTCOME MEASURES (PROM):   Neuro- QOL - Cognitive Function PROM   -Eren: 61 - Mom: 37                                                                                                                            TREATMENT DATE:   11/10/24: Completed CLQT - see above for scores. Reviewed with mom and patient. No questions at this time. Mother reports her main focus is communication and is wanting to focus speech intelligibility at this time. SLP encouraged mom to think about memory strategies as well as pt is completely dependent on parents. Mom open to working on memory. OT to assist with facilitating cognition within ADLs. SLP provided pt with speech intelligibility strategies re: SLOP today. To focus on over-articulation.   **Neuro is likely closest. Pt would likely seek ST at different location - but would cont OT/PT here.**    PATIENT EDUCATION: Education details: cognitive-communication Person educated: Patient and Parent Education method: Explanation Education comprehension: verbalized understanding   GOALS: Goals reviewed with patient? Yes  SHORT TERM GOALS: Target date: 12/05/24  Complete PROM, testing, and update goals Baseline: Goal status: MET  2.  Pt will verbalize 2 strategies to improve speech intelligibility.  Baseline:  Goal status: INITIAL  3.  Pt will recall the following Mneumonic  STPD (STOP THINK PLAN DO).  Baseline:  Goal status: INITIAL    LONG TERM GOALS: Target date: 01/05/25  Pt will improve score on PROM Baseline:  Goal status: INITIAL  2.  Pt/mom will report use of external memory strategies  Baseline:  Goal status: INITIAL  3.  Pt will demonstrate use of 1 speech intelligibility strategy in structured conversation with minA verbal cues.  Baseline:  Goal status: INITIAL   ASSESSMENT:  CLINICAL IMPRESSION: Pt is a 43 yo male who presents to ST OP for evaluation post CVA. Pt was accompanied by mother for  today's evaluation. Pt with baseline deficits following brain CA in 1997. Mom reports changes in thinking skills post CVA re: impulsivity (specifically with eating/movement), short term memory, and focus, as well as, and speech.  Pt was assessed using CLQT and informal speech measures. Pt speech observed to be around 25% intelligible to an unfamiliar listener and c/b re: rapid rate, imprecise articulation. SLP attempted to pace patient with asking close-ended questions, which appeared to be helpful. Pt required cueing to wait during assessment, as pt would try to take pen from therapist hand while she was explaining/demonstrating directions.  He also benefited from repetition throughout evaluation. SLP rec skilled ST services to address dysarthria and cognitive-communication impairment.    OBJECTIVE IMPAIRMENTS: include attention, memory, awareness, executive functioning, and dysarthria. These impairments  are limiting patient from managing medications, managing appointments, managing finances, household responsibilities, ADLs/IADLs, and effectively communicating at home and in community. Factors affecting potential to achieve goals and functional outcome are ability to learn/carryover information 2/2 to memory impairment. Patient will benefit from skilled SLP services to address above impairments and improve overall function.  REHAB POTENTIAL: Fair 2/2 to memory impairment; however, great caregiver support.   PLAN:  SLP FREQUENCY: 2x/week  SLP DURATION: 8 weeks  PLANNED INTERVENTIONS: Environmental controls, Cueing hierachy, SLP instruction and feedback, Compensatory strategies, Patient/family education, and [id number] Treatment of speech (30 or 45 min)     [employer], CCC-SLP 11/10/2024, 8:54 AM          "

## 2024-11-11 ENCOUNTER — Telehealth: Payer: Self-pay | Admitting: *Deleted

## 2024-11-11 NOTE — Telephone Encounter (Signed)
 RTC to [facility] -patient has applied for Chronic Heath Condition for his Insurance.  Need to know about medication-Plavix .  Representative to fax form for completion that will list al of patient's current medications.                    Copied from CRM #[id number]. Topic: General - Other >> Nov 11, 2024 11:02 AM Chiquita SQUIBB wrote: Reason for CRM: Tersea with [facility] is calling in regarding the patients C-SNP form, she stated the form was marked that the patient had none of the above but the patient is on plavix , so the form would be incorrect and none of the patients current medications are listed on the forms, which would be the medications the patient was put on in the hospital. Please advise [facility] back at [phone].

## 2024-11-12 ENCOUNTER — Ambulatory Visit: Admitting: Occupational Therapy

## 2024-11-14 ENCOUNTER — Telehealth: Payer: Self-pay | Admitting: *Deleted

## 2024-11-14 NOTE — Telephone Encounter (Addendum)
 RTCto patient's mother stated patient is currently getting Rehab at home and she is unable to bring patient in for an appointment.  Stated just needed to have a current list of patient's medications sent to [facility]. Explained to patient's mother that we will need to see patient for a visit in order to know exactly what medications patient is currently taking. Mother agreed to if possible to have the doctor give her a call to discuss patient's current medications as she cannot get patient in for an appointment.  Copied from CRM #[id number]. Topic: General - Other >> Nov 12, 2024  1:34 PM Suzette B wrote: Reason for CRM: Mother of pt called wanting to speak with someone in the office in regards to the information requested by [facility]! I advised Ms. Cowdrey that looking through the chart I did see where the nurse had communicated with [facility], she asked if I seen where [facility] ws faxed the updated information, she asked how ws the medication not listed on the form the first time. I advised the pt, unfortunately I wasn't able to speak from a clinical stand point, but would see to it that the nurse is able to call as assist on return to office Friday.

## 2024-11-17 ENCOUNTER — Encounter: Payer: Self-pay | Admitting: Registered Nurse

## 2024-11-17 ENCOUNTER — Encounter: Attending: Registered Nurse | Admitting: Registered Nurse

## 2024-11-17 VITALS — BP 138/84 | HR 71 | Ht 68.0 in | Wt 208.0 lb

## 2024-11-17 DIAGNOSIS — E7849 Other hyperlipidemia: Secondary | ICD-10-CM | POA: Insufficient documentation

## 2024-11-17 DIAGNOSIS — I639 Cerebral infarction, unspecified: Secondary | ICD-10-CM | POA: Diagnosis not present

## 2024-11-17 DIAGNOSIS — I10 Essential (primary) hypertension: Secondary | ICD-10-CM | POA: Diagnosis not present

## 2024-11-17 DIAGNOSIS — I69398 Other sequelae of cerebral infarction: Secondary | ICD-10-CM | POA: Diagnosis not present

## 2024-11-17 DIAGNOSIS — R252 Cramp and spasm: Secondary | ICD-10-CM | POA: Diagnosis not present

## 2024-11-17 MED ORDER — CLOPIDOGREL BISULFATE 75 MG PO TABS: 75.0000 mg | ORAL_TABLET | Freq: Every day | ORAL | 0 refills | Status: AC

## 2024-11-17 NOTE — Progress Notes (Signed)
 "  Subjective:    Patient ID: Nicholas Holt, male    DOB: [birth year], 44 y.o.   MRN: [id number]  HPI: Nicholas Holt is a 44 y.o. male  who is here for HFU appointment for F/U of his Left basal ganglia embolic stroke, Spasticity,  Essential Hypertension and Hyperlipidemia.   He was brought to [facility] ED on 10/03/2024, with unsteady gait.  Dr. Arthea: H&P Nicholas Holt is a 44 y.o. male with medical history significant for brain cancer when he was a teenager and now has subsequent severe neurologic disability. He was brought in by his parents.  His mom reports that last night he was holding on to the walls to keep himself steady. This morning it was much worse and she thought he was going to fall even holding on to the wall.  He was very wobbly. Normally he can walk independently. He was also just repeating what she was saying over and over, not communicating clearly.  This was an abrupt change from 24 hours earlier so she brought him in for evaluation.   About a week ago he had a change in his medication.  His DDAVP  was changed from spray to pills.  He had been on DDAVP  spray for more than 15 years. The mom first called his endocrinologist and they were both worried about his sodium level so the endocrinologist advised bringing him to the ED.   CT: Head WO Contrast IMPRESSION: 1. No acute intracranial abnormality. 2. Findings consistent with Fahr disease. 3. Chronic encephalomalacia or lacunar infarcts in the inferior left basal ganglia. 4. Right frontal burr hole for prior ventriculostomy catheter with associated anterior right frontal lobe encephalomalacia.  MR: Brain: IMPRESSION: 1. 8 mm acute infarction in the posterior limb of the internal capsule on the left. 2. Chronic postsurgical changes in the suprasellar region and hypothalamus. No evidence of recurrent neoplasm. 3. Chronic small-vessel ischemic changes elsewhere throughout the brain as outlined above. 4. Chronic  findings of Fahr disease or post treatment brain calcification.  CTA: IMPRESSION: 1. No large vessel occlusion in the head or neck. 2. Extensive mineralization within the thalami, left basal ganglia, and both cerebellar hemispheres consistent with Fahr disease.  Neurology was consulted: Recommended DAPT x 3 weeks followed by Plavix  alone.   Nicholas Holt was admitted to inpatient Rehabilitation on 10/10/2024 and discharged home on 10/31/2024. He denies any pain. He rates his pain 0. He is receiving outpatient therapy at [facility] .Also reports he has a good appetite.   Parents in room all questions answered. Mother was instructed to call PCP to schedule HFU appointment, she verbalizes understanding.   [facility] was called, he now has a scheduled HFU appointment with Neurology. For 12/23/2024.   Pain Inventory Average Pain 0 Pain Right Now 0 My pain is no pain  LOCATION OF PAIN  no pain  BOWEL Number of stools per week: 6 Oral laxative use No  Type of laxative dulcolax Enema or suppository use No  History of colostomy No  Incontinent No   BLADDER Normal   Mobility how many minutes can you walk? 10 ability to climb steps?  yes do you drive?  no Do you have any goals in this area?  yes  Function what is your job? 1997 I need assistance with the following:  bathing, meal prep, household duties, and shopping Do you have any goals in this area?  yes  Neuro/Psych weakness trouble walking spasms anxiety  Prior Studies  no  Physicians involved in your care Any changes since last visit?  no    Family History  Problem Relation Age of Onset   Hypertension Mother    Hypertension Father    Hyperlipidemia Father    Prostate cancer Father    Diabetes type I Sister        Died of complications of diabetes in her 13's   Sleep apnea Maternal Aunt    Social History   Socioeconomic History   Marital status: Single    Spouse name: Not on file   Number of  children: Not on file   Years of education: Not on file   Highest education level: Not on file  Occupational History   Not on file  Tobacco Use   Smoking status: Never    Passive exposure: Never   Smokeless tobacco: Never  Vaping Use   Vaping status: Never Used  Substance and Sexual Activity   Alcohol use: No    Alcohol/week: 0.0 standard drinks of alcohol   Drug use: No   Sexual activity: Not on file  Other Topics Concern   Not on file  Social History Narrative   Lives with parents.  Parents retired. Father or mother take him to appointments.  Mother sends notes if she can not take him.   Social Drivers of Health   Tobacco Use: Low Risk (11/10/2024)   Patient History    Smoking Tobacco Use: Never    Smokeless Tobacco Use: Never    Passive Exposure: Never  Financial Resource Strain: Low Risk (08/06/2024)   Overall Financial Resource Strain (CARDIA)    Difficulty of Paying Living Expenses: Not hard at all  Food Insecurity: No Food Insecurity (10/04/2024)   Epic    Worried About Programme Researcher, Broadcasting/film/video in the Last Year: Never true    Ran Out of Food in the Last Year: Never true  Transportation Needs: No Transportation Needs (10/04/2024)   Epic    Lack of Transportation (Medical): No    Lack of Transportation (Non-Medical): No  Physical Activity: Inactive (08/06/2024)   Exercise Vital Sign    Days of Exercise per Week: 0 days    Minutes of Exercise per Session: 0 min  Stress: No Stress Concern Present (08/06/2024)   [facility] [employer] - Occupational Stress Questionnaire    Feeling of Stress: Not at all  Social Connections: Patient Declined (08/06/2024)   Social Connection and Isolation Panel    Frequency of Communication with Friends and Family: Patient declined    Frequency of Social Gatherings with Friends and Family: Patient declined    Attends Religious Services: Patient declined    Active Member of Clubs or Organizations: Patient declined    Attends  Banker Meetings: Patient declined    Marital Status: Patient declined  Depression (PHQ2-9): Low Risk (08/06/2024)   Depression (PHQ2-9)    PHQ-2 Score: 0  Alcohol Screen: Low Risk (08/06/2024)   Alcohol Screen    Last Alcohol Screening Score (AUDIT): 0  Housing: Low Risk (10/04/2024)   Epic    Unable to Pay for Housing in the Last Year: No    Number of Times Moved in the Last Year: 0    Homeless in the Last Year: No  Utilities: Not At Risk (10/04/2024)   Epic    Threatened with loss of utilities: No  Health Literacy: Not on file   Past Surgical History:  Procedure Laterality Date   BRAIN BIOPSY  11/14/1995  CSF SHUNT  11/14/1995   PORTACATH PLACEMENT     portacath removal     RADIOLOGY WITH ANESTHESIA N/A 08/30/2021   Procedure: MRI WITH ANESTHESIA BRAIN WITH AND WITHOUT CONTRAST;  Surgeon: Radiologist, Medication, MD;  Location: [facility] OR;  Service: Radiology;  Laterality: N/A;   RADIOLOGY WITH ANESTHESIA N/A 05/25/2022   Procedure: MRI WITH ANESTHESIA OF BRAIN WITH AND WITHOUT CONTRAST;  Surgeon: Radiologist, Medication, MD;  Location: [facility] OR;  Service: Radiology;  Laterality: N/A;   RADIOLOGY WITH ANESTHESIA N/A 10/05/2024   Procedure: MRI WITH ANESTHESIA;  Surgeon: Radiologist, Medication, MD;  Location: [facility] OR;  Service: Radiology;  Laterality: N/A;   Past Medical History:  Diagnosis Date   Anxiety    Attention deficit disorder 08/08/2006   Bilateral leg cramps 11/25/2006   Blood transfusion without reported diagnosis 1997   Chemotherapy associated   Brain cancer (HCC) 1998   Cough 12/08/2015   COVID-19 10/16/2017   Elevated hemidiaphragm 07/09/2015   Left, presumably after portacath placement.    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  Complicated by left sided motor tic disorder and  panhypopituitarism.    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  Includes central diabetes insipidus, secondary hypothyroidism, secondary adrenal insufficiency, and secondary hypogonadism.    Perennial allergic rhinitis 07/09/2015   Pre-diabetes    no meds   Preventative health care 07/09/2015   Seizure disorder (HCC) 08/08/2006   Predated brain tumor.  Generalized seizure ocurred after running out of lorazepam  post-tumor.    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 Treated with alendronate  from 2009-2016. DEXA (03/16/2008): L Spine T -2.0. L fem neck T -0.9, R fem neck T -1.9.  DEXA (10/15/2014): L Spine T -1.3, L fem neck T -0.6, R fem neck T -1.8.  Bisphosphonate holiday 06/2015.  Repeat DEXA scan in 10/2016 to reassess.    Weakness 09/15/2023   There were no vitals taken for this visit.  Opioid Risk Score:   Fall Risk Score:  `1  Depression screen [facility] 2/9     08/06/2024   11:57 AM 07/08/2024    8:20 AM 03/31/2024    2:38 PM 01/08/2024   11:03 AM 09/14/2023    9:35 AM 04/23/2023    3:14 PM 04/23/2023    3:12 PM  Depression screen PHQ 2/9  Decreased Interest 0 0 0 0 0 0 0  Down, Depressed, Hopeless 0 0 0 0 0 0 0  PHQ - 2 Score 0 0 0 0 0 0 0  Altered sleeping 0        Tired, decreased energy 0        Change in appetite 0        Feeling bad or failure about yourself  0        Trouble concentrating 0        Moving slowly or fidgety/restless 0        Suicidal thoughts 0        PHQ-9 Score  0         Difficult doing work/chores Not difficult at all           Data saved with a  previous flowsheet row definition    Review of Systems  Musculoskeletal:  Positive for gait problem.  Neurological:  Positive for weakness.  Psychiatric/Behavioral:  Positive for confusion.        Spasms, anxiety  All other systems reviewed and are negative.      Objective:   Physical Exam Vitals and nursing note reviewed.  Constitutional:      Appearance: Normal appearance.  Cardiovascular:     Rate and Rhythm: Normal rate and regular rhythm.     Pulses: Normal pulses.     Heart sounds: Normal heart sounds.  Pulmonary:     Effort: Pulmonary effort is normal.     Breath sounds: Normal breath sounds.  Musculoskeletal:     Comments: Normal Muscle Bulk and Muscle Testing Reveals:  Upper Extremities: Full ROM and Muscle Strength  on Right 5/5 and left 4/5   Lower Extremities: Right: Full ROM and Muscle Strength 5/5 Left Lower Extremity: Full ROM and Muscle Strength 5/5 Wearing AFO Arises from Table with ease Narrow Based  Gait     Skin:    General: Skin is warm and dry.  Neurological:     Mental Status: He is alert and oriented to person, place, and time.  Psychiatric:        Mood and Affect: Mood normal.        Behavior: Behavior normal.          Assessment & Plan:  1.Left basal ganglia embolic stroke, Spasticity: [facility] was called: Schedule for HFU appointment on 12/23/2024. Continue current medication regimen. Continue  Outpatient Therapy with Myra Pinion  2.  Essential Hypertension: Continue current medication regimen: Mr. Hanken mother will call Dr Karna for [facility] appointment, she verbalizes understanding.  3. Hyperlipidemia: Continue current medication regimen. PCP following.   F/U with Dr Carilyn in 4- 6 weeks    "

## 2024-11-18 ENCOUNTER — Ambulatory Visit: Admitting: Occupational Therapy

## 2024-11-18 ENCOUNTER — Ambulatory Visit: Attending: Physician Assistant | Admitting: Physical Therapy

## 2024-11-18 ENCOUNTER — Encounter: Payer: Self-pay | Admitting: Physical Therapy

## 2024-11-18 ENCOUNTER — Telehealth: Payer: Self-pay | Admitting: Internal Medicine

## 2024-11-18 DIAGNOSIS — R29818 Other symptoms and signs involving the nervous system: Secondary | ICD-10-CM

## 2024-11-18 DIAGNOSIS — R2689 Other abnormalities of gait and mobility: Secondary | ICD-10-CM | POA: Insufficient documentation

## 2024-11-18 DIAGNOSIS — M25632 Stiffness of left wrist, not elsewhere classified: Secondary | ICD-10-CM | POA: Insufficient documentation

## 2024-11-18 DIAGNOSIS — R41840 Attention and concentration deficit: Secondary | ICD-10-CM | POA: Insufficient documentation

## 2024-11-18 DIAGNOSIS — M6281 Muscle weakness (generalized): Secondary | ICD-10-CM | POA: Insufficient documentation

## 2024-11-18 DIAGNOSIS — R41841 Cognitive communication deficit: Secondary | ICD-10-CM | POA: Insufficient documentation

## 2024-11-18 DIAGNOSIS — R278 Other lack of coordination: Secondary | ICD-10-CM

## 2024-11-18 DIAGNOSIS — R41842 Visuospatial deficit: Secondary | ICD-10-CM

## 2024-11-18 DIAGNOSIS — M25622 Stiffness of left elbow, not elsewhere classified: Secondary | ICD-10-CM

## 2024-11-18 DIAGNOSIS — R252 Cramp and spasm: Secondary | ICD-10-CM | POA: Insufficient documentation

## 2024-11-18 DIAGNOSIS — R41844 Frontal lobe and executive function deficit: Secondary | ICD-10-CM | POA: Insufficient documentation

## 2024-11-18 DIAGNOSIS — R471 Dysarthria and anarthria: Secondary | ICD-10-CM | POA: Insufficient documentation

## 2024-11-18 DIAGNOSIS — I639 Cerebral infarction, unspecified: Secondary | ICD-10-CM | POA: Insufficient documentation

## 2024-11-18 NOTE — Therapy (Addendum)
 " OUTPATIENT PHYSICAL THERAPY NEURO TREATMENT    Patient Name: Nicholas Holt MRN: [id number] DOB:[birth year], 44 y.o., male Today's Date: 11/18/2024   PCP: Ronnald Sergeant, MD REFERRING PROVIDER: Toribio Pitch, PA-C  END OF SESSION:  PT End of Session - 11/18/24 1542     Visit Number 2    Number of Visits 14    Date for Recertification  01/12/25    Progress Note Due on Visit 10    PT Start Time 1447    PT Stop Time 1528    PT Time Calculation (min) 41 min    Activity Tolerance Patient tolerated treatment well    Behavior During Therapy Impulsive;WFL for tasks assessed/performed;Restless           Past Medical History:  Diagnosis Date   Anxiety    Attention deficit disorder 08/08/2006   Bilateral leg cramps 11/25/2006   Blood transfusion without reported diagnosis 1997   Chemotherapy associated   Brain cancer (HCC) 1998   Cough 12/08/2015   COVID-19 10/16/2017   Elevated hemidiaphragm 07/09/2015   Left, presumably after portacath placement.    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  Complicated by left sided motor tic disorder and panhypopituitarism.    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  Includes central diabetes insipidus, secondary hypothyroidism, secondary adrenal insufficiency, and secondary hypogonadism.    Perennial allergic rhinitis 07/09/2015   Pre-diabetes    no meds   Preventative health care 07/09/2015    Seizure disorder (HCC) 08/08/2006   Predated brain tumor.  Generalized seizure ocurred after running out of lorazepam  post-tumor.    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 Treated with alendronate  from 2009-2016. DEXA (03/16/2008): L Spine T -2.0. L fem neck T -0.9, R fem neck T -1.9.  DEXA (10/15/2014): L Spine T -1.3, L fem neck T -0.6, R fem neck T -1.8.  Bisphosphonate holiday 06/2015.  Repeat DEXA scan in 10/2016 to reassess.    Weakness 09/15/2023   Past Surgical History:  Procedure Laterality Date   BRAIN BIOPSY  11/14/1995   CSF SHUNT  11/14/1995   PORTACATH PLACEMENT     portacath removal     RADIOLOGY WITH ANESTHESIA N/A 08/30/2021   Procedure: MRI WITH ANESTHESIA BRAIN WITH AND WITHOUT CONTRAST;  Surgeon: Radiologist, Medication, MD;  Location: [facility] OR;  Service: Radiology;  Laterality: N/A;   RADIOLOGY WITH ANESTHESIA N/A 05/25/2022   Procedure: MRI WITH ANESTHESIA OF BRAIN WITH AND WITHOUT CONTRAST;  Surgeon: Radiologist, Medication, MD;  Location: [facility] OR;  Service: Radiology;  Laterality: N/A;   RADIOLOGY WITH ANESTHESIA N/A 10/05/2024   Procedure: MRI WITH ANESTHESIA;  Surgeon: Radiologist, Medication, MD;  Location: [facility] OR;  Service: Radiology;  Laterality: N/A;   Patient Active Problem List   Diagnosis Date Noted   Left basal ganglia embolic stroke (HCC) 10/10/2024   Small vessel stroke (HCC) 10/10/2024   Gait abnormality 10/03/2024   Diarrhea 07/08/2024   Functional urinary incontinence 07/08/2024   Class 1  obesity with serious comorbidity and body mass index (BMI) of 30.0 to 30.9 in adult 04/23/2023   Prediabetes 10/10/2022   Elevated CK 10/10/2022   OSA (obstructive sleep apnea) 07/06/2022   Multiple lacunar infarcts (HCC) 05/04/2022   Hypertension 12/09/2019   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   Elevated hemidiaphragm 07/09/2015   Perennial allergic rhinitis 07/09/2015   H/O malignant neoplasm of brain 09/22/2014    Steroid-induced osteoporosis 03/27/2008   Bilateral leg cramps 11/25/2006   Panhypopituitarism of anterior and posterior pituitary 08/08/2006   Hyperlipidemia 08/08/2006   Attention deficit disorder 08/08/2006   Seizure disorder (HCC) 08/08/2006    ONSET DATE: 10/03/24  REFERRING DIAG: I63.9 (ICD-10-CM) - Left basal ganglia embolic stroke (HCC)  THERAPY DIAG:  Other abnormalities of gait and mobility  Muscle weakness (generalized)  Other lack of coordination  Rationale for Evaluation and Treatment: Rehabilitation  SUBJECTIVE:                                                                                                                                                                                             SUBJECTIVE STATEMENT:   Doing good, got the Cadence shoes but wanted you all to look at them before using them outside (can't refund if worn outside). Does good going upstairs, coming down is harder    EVAL: Pt presents with his mother for PT eval. States that on 10/03/24 she noted decreased balance, pt attempting to use the wall for balance and was decreased from prior functional baseline. Pt denies pain in the last week, not likely to report per mother, but she does not believe he has been having any pain. Inquires about [name] shoe, already ordered, pt has slide plate on L toe of the shoe to assist with mitigating fall risk. Pt ind with cog assist prior to CVA, requires CGA for walking currently. Trialed hemiwalker, not indicated with prior PT.  Pt accompanied by: family member  PERTINENT HISTORY:   From Acute Care Eval: 10/06/24 Pt admitted with above diagnosis. At baseline, pt ambulatory without AD and enjoys going to the gym and bowling.  He does live with mother and father who assist with iadls and other task due to some cognitive and mobility deficits from prior CVA but still is fairly independent.  Pt with L hand contracture and L ankle contracture wearing AFO from  prior CVA after brain tumor.  Today, pt requiring mod-max A for transfers due to posterior lean.  He did ambulate 45' with PT but with mod A and unsafe gait pattern requiring frequent cues and stopping to regain balance and get close to  RW, AFO in place.  Pt's strength difficult to assess completely due to unable to balance while sitting EOB, does appear to be slightly weaker on L compared to R - however, that may be baseline.  From CIR Eval: 10/11/24 Lynwood BROCKS. Remmers is a 44 year old male with history of suprasellar germinoma s/p chemo and XRT '97 with residual Left hemiplegia cognitive deficits, panhypopituitarism, anxiety d/o, ADD who was admitted on 10/03/24 with 4 day history of difficulty walking and worsening of dysarthria. Family reported recent change of DDAVP  from intranasal to oral route and was advised to present to ED by endocrinology due to concerns of sodium level which was 129 at admission. CT head negative for acute changes and showed right burr hole from prior ventric with anterior right frontal encephalomalacia and chronic encephalomalacia left basal ganglia with finding c/w Fahr disease. Neurology consulted and MRI brain done revealing 8 mm acute infarct in posterior limb left internal capsule. CTA brain was negative for LVO. Dr. Jerri felt that stroke likely related to small vessel radiation related vasculopathy and recommended DAPT X 3 week followed by Plavix  alone.  PT note 10/30/24 Pt performs sit to stand and ambulates x50' to toilet with CGA and no AD with cues to increase Lt stride length to improve reciprocal gait pattern and balance. Following, pt ambulates x50' back to [facility] with same cues. Pt completes multiple additional bouts of ambulation to work on endurance, balance, and coordination, completing x100', x150', and x75' with seated rest breaks. Pt also completes 4 6 steps with Rt handrail and cues for safe step sequencing and positioning, with CGA provided overall.    PAIN:   Are you having pain? No 0/10 currently   PRECAUTIONS: Fall and Other: Left sided weakness and hemiplegia   RED FLAGS: None   WEIGHT BEARING RESTRICTIONS: No  FALLS: Has patient fallen in last 6 months? No  LIVING ENVIRONMENT: Lives with: lives with their family Lives in: House/apartment Stairs: Yes: Internal: flight steps; on right going up and External: 3 steps; none Has following equipment at home: Single point cane  PLOF: Independent  PATIENT GOALS: return to PLOF, helping with groceries, bowling  OBJECTIVE:  Note: Objective measures were completed at Evaluation unless otherwise noted.  DIAGNOSTIC FINDINGS:   IMPRESSION: 10/05/24 1. 8 mm acute infarction in the posterior limb of the internal capsule on the left. 2. Chronic postsurgical changes in the suprasellar region and hypothalamus. No evidence of recurrent neoplasm. 3. Chronic small-vessel ischemic changes elsewhere throughout the brain as outlined above. 4. 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  COGNITION: Overall cognitive status: History of cognitive impairments - at baseline   SENSATION: WFL  COORDINATION: Decreased with inc velocity of movements bilaterally, L>R, finger/nose, finger/finger, stepping pattern in sitting (R, L, R, L) able to improve with decreased cadence  EDEMA:  Slight increase in edema of the L ankle, retromalleolar  MUSCLE TONE: LLE: left flexion contracture of L wrist, slight inc from baseline. Mild hypertonicity in LLE with quick movements, WNL if moves slowly    DTRs:  Biceps 1 = Trace , Brachioradialis 1 = Trace , Triceps 1 = Trace , Patella 1 = Trace , and Achilles 1 = Trace   POSTURE: rounded shoulders, forward head, weight shift right, and weight shift left  LOWER EXTREMITY ROM:     Active  Right Eval Left Eval  Hip flexion WNL 114  Hip extension    Hip abduction 43 40  Hip  adduction WNL WNL  Hip internal rotation    Hip  external rotation    Knee flexion 125 118  Knee extension -4 -10  Ankle dorsiflexion 19 12  Ankle plantarflexion 30 26  Ankle inversion    Ankle eversion     (Blank rows = not tested)  LOWER EXTREMITY MMT:    MMT Right Eval Left Eval  Hip flexion 4/5 3+/5  Hip extension    Hip abduction 4+/5 3/5  Hip adduction 4+/5 4+/5  Hip internal rotation    Hip external rotation    Knee flexion 4/5 3-/5  Knee extension 4/5 3/5  Ankle dorsiflexion 3+/5 2/5  Ankle plantarflexion 3+/5 3/5  Ankle inversion    Ankle eversion    (Blank rows = not tested)   TRANSFERS: Sit to stand: SBA  Assistive device utilized: None      GAIT: Findings: Distance walked: 145ft, Assistive device utilized:Walker - 2 wheeled and None, Level of assistance: CGA, and Comments: frequent cues for pacing as pt is impulsive and attempted to complete increased cadence, with fatigue he is unable to maintain LLE demands from velocity, safety awareness deficits place him at inc risk of falls. Pt cued for step height and clearance during LLE swing phase, unable to maintain without cues and unable to maintain with inc fatigue. Cued for pacing at this point with improved safety taking smaller steps.                                                                                                                               TREATMENT DATE:   11/18/24   Gait assessment and training in Cadence shoes by themselves (had more of a shuffle gait pattern with poor toe clearance and anterior translation of wt/short fast steps), Cadence shoes + AFO (turned into more of a steppage gait pattern with poor motor control L LE), regular shoes + AFO (had best foot clearance although gait pattern certainly was not perfect)- additionally Cadence shoe toe box was a bit too long and too narrow for his forefoot, making risk of tripping and skin breakdown fairly high. Recommended they use regular shoes and AFO for now.  Nustep L5x8 minutes all four  extremities  seat 15 Wide tandem  blue foam pad 2x30 seconds B  Forward step downs 6 inch step very close min guard x10 B    11/03/24- EVAL    PATIENT EDUCATION: Education details: Pt educated on relevant anatomy, physiology, pathology, diagnosis, prognosis, progression of care, pain and activity modification related to s/p CVA 10/03/24 Person educated: Patient and Parent Education method: Explanation, Demonstration, and Handouts Education comprehension: verbalized understanding, returned demonstration, verbal cues required, and needs further education  HOME EXERCISE PROGRAM: Access Code: [email or web address] URL: [email or web address] Date: 11/03/2024 Prepared by: Stann Ohara  Exercises - Seated Hip Flexion March with Ankle Weights  - 2 x daily - 7 x weekly - 3 sets - 15 reps - 2 hold -  Seated Long Arc Quad  - 2 x daily - 7 x weekly - 3 sets - 15 reps - 2 hold - Seated Heel Raise  - 2 x daily - 7 x weekly - 3 sets - 15 reps - 2 hold  GOALS: Goals reviewed with patient? Yes  SHORT TERM GOALS: Target date: 12/11/24   Pt will report compliance with HEP to work towards ind and home management strategies Baseline: Goal status: INITIAL   2.  Pt will amb x340ft with LRAD at supervision assist or less to demonstrate improved activity tolerance and safety with home mobility Baseline:  Goal status: INITIAL   3.  Pt will improve L LE AROM to full and painless in order to demonstrate progress towards activity tolerance and improved function Baseline:  Goal status: INITIAL  4.  Pt will demonstrate stair negotiation using non reciprocal pattern x1 flight with right handrail per home setup with CGA and demonstrate safety recall requiring caregiver presence to assist with steps Baseline: 2 steps into the house, mod A  Goal status: INITIAL    LONG TERM GOALS: Target date: 01/12/25   Pt will amb x764ft with LRAD at supervision assist or less to demonstrate improved activity  tolerance and safety with home mobility Baseline:  Goal status: INITIAL   2.  Pt will demonstrate 4/5 strength or greater in LLE to demonstrate improved strength and stability Baseline:  Goal status: INITIAL   3.  Pt will be ind in the management of their symptoms at home and in the community Baseline:  Goal status: INITIAL   4.  Pt will complete unilateral carry of 5 pounds or greater a distance of 190ft to mimic assisting with groceries  Baseline:  Goal status: INITIAL     ASSESSMENT:  CLINICAL IMPRESSION:   Focused part of session on gait assessment and fit assessment of [name] shoes- I do have some concerns about fall risk as toe box is too long and also too narrow for him, risking skin break down. I think he honestly walks better in his regular shoes and brace at the moment even though he does require further gait training. 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 Will continue to challenge as able and tolerated. He was fairly impulsive with gait and required heavy Mod cues for slowing gait speed and avoiding obstacles in the clinic.    EVAL: Patient is a 44 y.o. M who was seen today for physical therapy evaluation and treatment for s/p CVA 10/03/24 with L sided deficits. Pt has baseline deficits in the L hemisphere due to brain tumor at age 77. Prior to this event, he was able to participate in grocery shopping and home activities, amb without AD. Pt currently demonstrates inc impulsivity with safety awareness concerns requiring frequent cues. Pt and family educated on maintained safety. Pt stands to benefit from continued skilled physical therapy to address deficit areas and restore safety with activities and participations at home and in the community.     OBJECTIVE IMPAIRMENTS: Abnormal gait, decreased activity tolerance, decreased balance, decreased cognition, decreased coordination, decreased endurance, decreased mobility, difficulty  walking, decreased ROM, decreased strength, decreased safety awareness, increased edema, impaired tone, impaired UE functional use, impaired vision/preception, and postural dysfunction.   ACTIVITY LIMITATIONS: carrying, lifting, bending, standing, squatting, stairs, bathing, dressing, self feeding, and locomotion level  PARTICIPATION LIMITATIONS: shopping and community activity  PERSONAL FACTORS: Behavior pattern, Fitness, Past/current experiences, and Time since onset of injury/illness/exacerbation are also affecting  patient's functional outcome.   REHAB POTENTIAL: Good  CLINICAL DECISION MAKING: Evolving/moderate complexity  EVALUATION COMPLEXITY: Moderate  PLAN:  PT FREQUENCY: 1-2x/week  PT DURATION: 10 weeks  PLANNED INTERVENTIONS: 97110-Therapeutic exercises, 97530- Therapeutic activity, [id number]- Neuromuscular re-education, 97535- Self Care, [id number]- Manual therapy, [phone]- Gait training, [phone]- Orthotic Initial, [phone]- Orthotic/Prosthetic subsequent, [id number]- Electrical stimulation (unattended), Patient/Family education, and Wheelchair mobility training  PLAN FOR NEXT SESSION: progress motor control, trial two wheeled walker with built up L handle to accommodate flexor contracture, gait and balance, strength, stability, mobility, endurance through functional movement patterns.   Josette Rough, PT, DPT 11/18/2024 3:45 PM         "

## 2024-11-18 NOTE — Telephone Encounter (Signed)
 Message has been forwarded to [facility].    Copied from CRM #[id number]. Topic: General - Other >> Nov 18, 2024  8:40 AM Rosaria A wrote: Reason for CRM: Mrs. Goheen states that she received a message last night from MyChart stating that the patients virtual visit for 11/19/24 was canceled. She states that she did not cancel the appointment. I checked and the appointment is still showing on the patients schedule. Mrs. Apo wanted Kenneth to know that she is keeping the appointment.

## 2024-11-18 NOTE — Therapy (Unsigned)
 " OUTPATIENT OCCUPATIONAL THERAPY NEURO EVALUATION  Patient Name: Nicholas Holt MRN: [id number] DOB:Nov 17, 1980, 44 y.o., male Today's Date: 11/18/2024  PCP: Karna Fellows MD REFERRING PROVIDER: Dr. Carilyn  END OF SESSION:    Past Medical History:  Diagnosis Date   Anxiety    Attention deficit disorder 08/08/2006   Bilateral leg cramps 11/25/2006   Blood transfusion without reported diagnosis 1997   Chemotherapy associated   Brain cancer (HCC) 1998   Cough 12/08/2015   COVID-19 10/16/2017   Elevated hemidiaphragm 07/09/2015   Left, presumably after portacath placement.    Fall 04/13/2022   H/O malignant neoplasm of brain 09/22/2014   Suprasellar germinoma treated at [facility] in 1997; received chemotherapy (cisplatin, etoposide, vincristine, and cyclophosphomide) and radiation therapy.  Complicated by left sided motor tic disorder and panhypopituitarism.    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  Includes central diabetes insipidus, secondary hypothyroidism, secondary adrenal insufficiency, and secondary hypogonadism.    Perennial allergic rhinitis 07/09/2015   Pre-diabetes    no meds   Preventative health care 07/09/2015   Seizure disorder (HCC) 08/08/2006   Predated brain tumor.  Generalized seizure ocurred after running out of lorazepam  post-tumor.    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 Treated with alendronate  from 2009-2016. DEXA (03/16/2008): L Spine T -2.0. L fem neck T -0.9, R fem neck T -1.9.  DEXA (10/15/2014): L Spine T -1.3, L fem neck T -0.6, R fem neck T  -1.8.  Bisphosphonate holiday 06/2015.  Repeat DEXA scan in 10/2016 to reassess.    Weakness 09/15/2023   Past Surgical History:  Procedure Laterality Date   BRAIN BIOPSY  11/14/1995   CSF SHUNT  11/14/1995   PORTACATH PLACEMENT     portacath removal     RADIOLOGY WITH ANESTHESIA N/A 08/30/2021   Procedure: MRI WITH ANESTHESIA BRAIN WITH AND WITHOUT CONTRAST;  Surgeon: Radiologist, Medication, MD;  Location: [facility] OR;  Service: Radiology;  Laterality: N/A;   RADIOLOGY WITH ANESTHESIA N/A 05/25/2022   Procedure: MRI WITH ANESTHESIA OF BRAIN WITH AND WITHOUT CONTRAST;  Surgeon: Radiologist, Medication, MD;  Location: [facility] OR;  Service: Radiology;  Laterality: N/A;   RADIOLOGY WITH ANESTHESIA N/A 10/05/2024   Procedure: MRI WITH ANESTHESIA;  Surgeon: Radiologist, Medication, MD;  Location: [facility] OR;  Service: Radiology;  Laterality: N/A;   Patient Active Problem List   Diagnosis Date Noted   Left basal ganglia embolic stroke (HCC) 10/10/2024   Small vessel stroke (HCC) 10/10/2024   Gait abnormality 10/03/2024   Diarrhea 07/08/2024   Functional urinary incontinence 07/08/2024   Class 1 obesity with serious comorbidity and body mass index (BMI) of 30.0 to 30.9 in adult 04/23/2023   Prediabetes 10/10/2022   Elevated CK 10/10/2022   OSA (obstructive sleep apnea) 07/06/2022   Multiple lacunar infarcts (HCC) 05/04/2022   Hypertension 12/09/2019   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   Elevated hemidiaphragm 07/09/2015   Perennial allergic rhinitis 07/09/2015   H/O malignant neoplasm of brain 09/22/2014   Steroid-induced osteoporosis 03/27/2008   Bilateral leg cramps 11/25/2006   Panhypopituitarism of anterior and posterior pituitary 08/08/2006  Hyperlipidemia 08/08/2006   Attention deficit disorder 08/08/2006   Seizure disorder (HCC) 08/08/2006    ONSET DATE: 10/31/24- referral date  REFERRING DIAG:  I63.9 (ICD-10-CM) - Left basal ganglia embolic stroke (HCC)    THERAPY DIAG:   Muscle weakness (generalized)  Other lack of coordination  Visuospatial deficit  Attention and concentration deficit  Other symptoms and signs involving the nervous system  Stiffness of left wrist, not elsewhere classified  Stiffness of left elbow, not elsewhere classified  Rationale for Evaluation and Treatment: Rehabilitation  SUBJECTIVE:   SUBJECTIVE STATEMENT: Pt denies pain Pt accompanied by: family member  PERTINENT HISTORY: 44 yo M hospitalized 10/04/24 with impaired balance with posterior limb internal capsule infarct. PMH: suprasellar germinoma s/p chemo with left-sided motor disorder, HTN, HLD anxiety, ADD, ataxia, seizure disorder, OSA Pt d/c from CIR  Pt had LUE weakness from braintumor however pt's mom reports weakness is worse since CVA PRECAUTIONS:seizures, fall risk    PAIN:  Are you having pain? no  FALLS: Has patient fallen in last 6 months? No  LIVING ENVIRONMENT: Lives with: lives with their family Lives in: House/apartment Stairs: yes flight  Has following equipment at home: Shower bench  PLOF: Independent with household mobility with device and Needs assistance with ADLs  PATIENT GOALS: improve independence  OBJECTIVE:  Note: Objective measures were completed at Evaluation unless otherwise noted.  HAND DOMINANCE: Right  ADLs:Pt required some assist with ADLs prior to CVA Overall ADLs: Pt/ mom are currrently sleeping downstairs as she is concerned he will wake up and go downstairs on his own Transfers/ambulation related to ADLs: Eating: mod I  Grooming: supervision UB Dressing: dependent LB Dressing: dependent Toileting: min A Bathing: dependent Tub Shower transfers: has not tried has tub transfer bench Equipment: Transfer tub bench  IADLs:Dependent, PTA pt brought in groceries and vacuumed  Medication management: requires assistance Financial management: dependent    MOBILITY STATUS: minguard/ min a without device  Sitting  balance: posterior lean   UPPER EXTREMITY ROM:  LUE weakness  Active ROM Right eval Left eval  Shoulder flexion 115 75  Shoulder abduction    Shoulder adduction    Shoulder extension    Shoulder internal rotation    Shoulder external rotation    Elbow flexion  130  Elbow extension  -40  Wrist flexion  45 at rest, to 65 flexion  Wrist extension  -45,( -20 P/ROM)  Wrist ulnar deviation    Wrist radial deviation    Wrist pronation    Wrist supination    (Blank rows = not tested)    HAND FUNCTION: Grip strength: Right: 32 lbs; Left: 15 lbs  COORDINATION: Box and Blocks:  Right 34blocks, Left 33blocks  SENSATION: Not tested   MUSCLE TONE: LUE: Moderate and Hypertonic, history of spasticity from brain tumor, recent botox while hospitalized   COGNITION: Overall cognitive status: History of cognitive impairments - at baseline    VISION ASSESSMENT: To be further assessed in functional context, pt walks close to wall on right side      OBSERVATIONS: Impulsive gentleman accompanied by his mother  TREATMENT DATE:    11/10/24- Pt/ mom report pt donned pants with supervision today. Pt/ mom were instructed in red putty HEP for composite grip and tip pinch, mod v.c and demonstration for bilateral UE's Cane exercises for shoulder flexion, chest press and abduction to right side, mod v.c and facilitation. Placing and removing graded clothespins from container for sustained pinch 1-8# with right and left UE's mod v.c Pt has a old today and he wore a masks for treatment.   11/03/24- eval         PATIENT EDUCATION: Education details red theraputty, cane exercises- chest press and shoulder flexion issued for home Person educated: Patient and Parent Education method: Explanation, demonstration, v.c, handout Education comprehension: verbalized  understanding, returned demonstration, v.c  HOME EXERCISE PROGRAM:  11/10/24- red putty, cane ex   GOALS: Goals reviewed with patient? Yes  SHORT TERM GOALS: Target date: 12/04/24  I with HEP Baseline: Goal status:  ongoing 11/18/24  2.  Pt will consistently donn shirt  with set up Baseline:  Goal status:met  11/18/24  3.  Pt/ caregiver will be I with positioning to minimize risk for LUE contracture  Goal status: INITIAL  4.  Pt will donn pants with min A Baseline:  Goal status: ongoing, pt's mom reports supervison today, will monitor for consistency 11/10/24  5.  Pt will increase bilateral UE functional use as evidenced by increasing box/ blocks score by 3 blocks bilaterally Baseline:  Goal status: INITIAL  6.  Further assess vision and set goal prn. Baseline:  Goal status: INITIAL  LONG TERM GOALS: Target date: 01/26/25  I with updated HEP Baseline:  Goal status: INITIAL  2.  Pt will increase RUE grip strength to 37 # or greater for increased functional use. Baseline:  Goal status: INITIAL  3.  Pt will increase RUE shoulder flexion to 120 for increased functional reach. Baseline:  Goal status: INITIAL  4.  Pt will increase L shoulder flexion to 85* for increased ease with dressing. Baseline:  Goal status: INITIAL  5.  Pt will perform dressing with supervision/ setup Baseline:  Goal status: INITIAL  6.  Pt will perfrom bathing with min A Baseline:  Goal status: INITIAL  ASSESSMENT:  CLINICAL IMPRESSION:   PERFORMANCE DEFICITS: in functional skills including ADLs, IADLs, coordination, dexterity, sensation, ROM, strength, pain, flexibility, Fine motor control, Gross motor control, mobility, balance, endurance, decreased knowledge of precautions, decreased knowledge of use of DME, and UE functional use, cognitive skills including attention, learn, problem solving, safety awareness, thought, and understand, and psychosocial skills including coping  strategies, environmental adaptation, habits, interpersonal interactions, and routines and behaviors.   IMPAIRMENTS: are limiting patient from ADLs, IADLs, rest and sleep, play, leisure, and social participation.   CO-MORBIDITIES: may have co-morbidities  that affects occupational performance. Patient will benefit from skilled OT to address above impairments and improve overall function.  MODIFICATION OR ASSISTANCE TO COMPLETE EVALUATION: Min-Moderate modification of tasks or assist with assess necessary to complete an evaluation.  OT OCCUPATIONAL PROFILE AND HISTORY: Detailed assessment: Review of records and additional review of physical, cognitive, psychosocial history related to current functional performance.  CLINICAL DECISION MAKING: Moderate - several treatment options, min-mod task modification necessary  REHAB POTENTIAL: Good  EVALUATION COMPLEXITY: Moderate    PLAN:  OT FREQUENCY: 2x/week  OT DURATION: 12 weeks  PLANNED INTERVENTIONS: 97168 OT Re-evaluation, 97535 self care/ADL training, 02889 therapeutic exercise, 97530 therapeutic activity, 97112 neuromuscular re-education, 97140 manual therapy, 97035 ultrasound, 97018 paraffin, 02989 moist  heat, 97010 cryotherapy, [id number] Cognitive training (first 15 min), 02869 Cognitive training(each additional 15 min), 02249 Physical Performance Testing, 02239 Orthotic Initial, 97763 Orthotic/Prosthetic subsequent, passive range of motion, balance training, functional mobility training, visual/perceptual remediation/compensation, energy conservation, coping strategies training, patient/family education, and DME and/or AE instructions  RECOMMENDED OTHER SERVICES: n/a  CONSULTED AND AGREED WITH PLAN OF CARE: Patient and family member/caregiver  PLAN FOR NEXT SESSION: ADL strategies, review HEP   Raylinn Kosar, OT 11/18/2024, 3:37 PM           "

## 2024-11-19 ENCOUNTER — Telehealth (INDEPENDENT_AMBULATORY_CARE_PROVIDER_SITE_OTHER): Payer: Self-pay | Admitting: Student

## 2024-11-19 ENCOUNTER — Encounter: Payer: Self-pay | Admitting: Occupational Therapy

## 2024-11-19 ENCOUNTER — Ambulatory Visit: Admitting: Speech Pathology

## 2024-11-19 DIAGNOSIS — Z8673 Personal history of transient ischemic attack (TIA), and cerebral infarction without residual deficits: Secondary | ICD-10-CM

## 2024-11-19 DIAGNOSIS — I639 Cerebral infarction, unspecified: Secondary | ICD-10-CM

## 2024-11-19 DIAGNOSIS — G238 Other specified degenerative diseases of basal ganglia: Secondary | ICD-10-CM | POA: Diagnosis not present

## 2024-11-19 NOTE — Progress Notes (Addendum)
 "  I connected with  Nicholas Holt on 11/20/2024 by telephone and verified that I am speaking with the correct person using two identifiers.   I discussed the limitations of evaluation and management by telemedicine. The patient expressed understanding and agreed to proceed.  CC: Medicine reconciliation and insurance paperwork  This is a telephone encounter between ARTAVIS COWIE and Lonni Africa on 11/19/2024 for the above issue. The visit was conducted with the patient located at home and Lonni Africa at [facility]. The patient's identity was confirmed using their DOB and current address. The patient has consented to being evaluated through a telephone encounter and understands the associated risks (an examination cannot be done and the patient may need to come in for an appointment) / benefits (allows the patient to remain at home, decreasing exposure to coronavirus). I personally spent 15 minutes on medical discussion.   HPI:  [name] is a 44 y.o. with PMH as below. He was admitted to the hospital on 11/21 with acute CVA in setting of chief complaint gait problem and mental status changes. He was discharged 11/28 to [facility] inpatient rehab with three weeks DAPT (since completed aspirin ). He left inpatient rehab on 12/19. He is doing well since. His mother directed this visit so that we could discuss the changes in his medicine since hospitalization and so that I can complete an insurance form necessary for him to receive benefits.  Please see A&P for assessment of the patient's acute and chronic medical conditions.   Past Medical History:  Diagnosis Date   Anxiety    Attention deficit disorder 08/08/2006   Bilateral leg cramps 11/25/2006   Blood transfusion without reported diagnosis 1997   Chemotherapy associated   Brain cancer (HCC) 1998   Cough 12/08/2015   COVID-19 10/16/2017   Elevated hemidiaphragm 07/09/2015   Left, presumably after portacath placement.    Fall  04/13/2022   H/O malignant neoplasm of brain 09/22/2014   Suprasellar germinoma treated at [facility] in 1997; received chemotherapy (cisplatin, etoposide, vincristine, and cyclophosphomide) and radiation therapy.  Complicated by left sided motor tic disorder and panhypopituitarism.    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  Includes central diabetes insipidus, secondary hypothyroidism, secondary adrenal insufficiency, and secondary hypogonadism.    Perennial allergic rhinitis 07/09/2015   Pre-diabetes    no meds   Preventative health care 07/09/2015   Seizure disorder (HCC) 08/08/2006   Predated brain tumor.  Generalized seizure ocurred after running out of lorazepam  post-tumor.    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 Treated with alendronate  from 2009-2016. DEXA (03/16/2008): L Spine T -2.0. L fem neck T -0.9, R fem neck T -1.9.  DEXA (10/15/2014): L Spine T -1.3, L fem neck T -0.6, R fem neck T -1.8.  Bisphosphonate holiday 06/2015.  Repeat DEXA scan in 10/2016 to reassess.    Weakness 09/15/2023   Review of Systems:  Negative  Assessment & Plan:   Patient discussed with Dr Francesco Assessment & Plan Chronic cerebrovascular accident (CVA) See my HPI and recent hospitalization for CVA in November 2025. He has a complex underlying  medical history including suprasellar germinoma s/p chemo and radiation with residual L hemiplegia and cognitive deficits. 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 His stroke was felt secondary to small vessel radiation. He competed DAPT and is  not on plavix  monotherapy. He completed three hours of PT/ST/OT in his rehab and is now home and doing well. - LDL goal is less than 70: most recent 56 - A1c goal less than 6: most recent 6 - Remains on rosuvastatin  20 and clopidogrel  75 daily - Upcoming neurology appointment 12/23/24  His mother was my primary contact during this phone call and she relayed no additional concerns. She confirmed his daily medicines and I listed them below. She requested that we complete an insurance form via [facility], and that is now completed.  The only medicine changes I recommend was consolidating his two stool softeners and two muscle relaxers into one rx. As such, I will end this rx for senokot, flexeril , and stop his aspirin  as he has made it through three weeks of recommended DAPT.  Tylenol  prn Vit C 2000 po every day B12 daily Baclofen  10 BID Dulcolax prn Lorazepam  6mg  at bedtime prn  Losartan  25mg   Multivitamin Muscle rub Potassium chloride  10mEq BID Prevagon 10 daily  Atorvastatin  20 daily Plavix  75 daily Flexeril  prn (ending) Desmopressin  10mcg QID Flonase  prn Hydrocortisone  5mg  daily - double when daily Synthroid  100 mcg daily Testosterone  Gel Aspirin  81mg  every day (ending) Senna-docusate BID (ending) Fahr's disease (HCC) See CT head 10/03/24  Lonni Africa D.O. [facility]  PGY-2 Pager: [phone] Date 11/19/2024  Time 8:27 AM "

## 2024-11-20 NOTE — Telephone Encounter (Signed)
 Call from pt's mother- Pt was had telemedicine visit yesterday with DrJuberg Mother checking on the status of paperwork  CMA informed mother that information was faxed on 01/05 and to reach out to insurance completion to confirm.  Ms Desroches will reach back out to [facility] if additional info needed. No further action needed at this time, phone call complete.Abimael Zeiter Cassady1/8/202611:01 AM

## 2024-11-21 ENCOUNTER — Ambulatory Visit: Admitting: Physical Therapy

## 2024-11-21 ENCOUNTER — Encounter: Payer: Self-pay | Admitting: Physical Therapy

## 2024-11-21 ENCOUNTER — Ambulatory Visit: Admitting: Speech Pathology

## 2024-11-21 ENCOUNTER — Encounter: Payer: Self-pay | Admitting: Speech Pathology

## 2024-11-21 DIAGNOSIS — R471 Dysarthria and anarthria: Secondary | ICD-10-CM

## 2024-11-21 DIAGNOSIS — R41841 Cognitive communication deficit: Secondary | ICD-10-CM

## 2024-11-21 DIAGNOSIS — M6281 Muscle weakness (generalized): Secondary | ICD-10-CM

## 2024-11-21 DIAGNOSIS — R278 Other lack of coordination: Secondary | ICD-10-CM

## 2024-11-21 NOTE — Therapy (Signed)
 " OUTPATIENT SPEECH LANGUAGE PATHOLOGY TREATMENT   Patient Name: Nicholas Holt MRN: [id number] DOB:[birth year], 44 y.o., male Today's Date: 11/21/2024  PCP: Karna Fellows, MD REFERRING PROVIDER: Pegge Toribio PARAS, PA-C  END OF SESSION:  End of Session - 11/21/24 1102     Visit Number 3    Number of Visits 17    Date for Recertification  01/04/25    SLP Start Time 1100    SLP Stop Time  1140    SLP Time Calculation (min) 40 min    Activity Tolerance Patient tolerated treatment well          Past Medical History:  Diagnosis Date   Anxiety    Attention deficit disorder 08/08/2006   Bilateral leg cramps 11/25/2006   Blood transfusion without reported diagnosis 1997   Chemotherapy associated   Brain cancer (HCC) 1998   Cough 12/08/2015   COVID-19 10/16/2017   Elevated hemidiaphragm 07/09/2015   Left, presumably after portacath placement.    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  Complicated by left sided motor tic disorder and panhypopituitarism.    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  Includes central diabetes insipidus, secondary hypothyroidism, secondary adrenal insufficiency, and secondary hypogonadism.    Perennial allergic rhinitis 07/09/2015   Pre-diabetes    no meds   Preventative health care 07/09/2015   Seizure disorder (HCC) 08/08/2006   Predated brain tumor.  Generalized seizure ocurred after running out of  lorazepam  post-tumor.    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 Treated with alendronate  from 2009-2016. DEXA (03/16/2008): L Spine T -2.0. L fem neck T -0.9, R fem neck T -1.9.  DEXA (10/15/2014): L Spine T -1.3, L fem neck T -0.6, R fem neck T -1.8.  Bisphosphonate holiday 06/2015.  Repeat DEXA scan in 10/2016 to reassess.    Weakness 09/15/2023   Past Surgical History:  Procedure Laterality Date   BRAIN BIOPSY  11/14/1995   CSF SHUNT  11/14/1995   PORTACATH PLACEMENT     portacath removal     RADIOLOGY WITH ANESTHESIA N/A 08/30/2021   Procedure: MRI WITH ANESTHESIA BRAIN WITH AND WITHOUT CONTRAST;  Surgeon: Radiologist, Medication, MD;  Location: [facility] OR;  Service: Radiology;  Laterality: N/A;   RADIOLOGY WITH ANESTHESIA N/A 05/25/2022   Procedure: MRI WITH ANESTHESIA OF BRAIN WITH AND WITHOUT CONTRAST;  Surgeon: Radiologist, Medication, MD;  Location: [facility] OR;  Service: Radiology;  Laterality: N/A;   RADIOLOGY WITH ANESTHESIA N/A 10/05/2024   Procedure: MRI WITH ANESTHESIA;  Surgeon: Radiologist, Medication, MD;  Location: [facility] OR;  Service: Radiology;  Laterality: N/A;   Patient Active Problem List   Diagnosis Date Noted   Left basal ganglia embolic stroke (HCC) 10/10/2024   Small vessel stroke (HCC) 10/10/2024   Gait abnormality 10/03/2024   Diarrhea 07/08/2024   Functional urinary incontinence 07/08/2024   Class 1 obesity with serious comorbidity and body mass index (BMI) of 30.0 to 30.9 in adult 04/23/2023   Prediabetes 10/10/2022  Elevated CK 10/10/2022   OSA (obstructive sleep apnea) 07/06/2022   Multiple lacunar infarcts (HCC) 05/04/2022   Hypertension 12/09/2019   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   Elevated hemidiaphragm 07/09/2015   Perennial allergic rhinitis 07/09/2015   H/O malignant neoplasm of brain 09/22/2014   Steroid-induced osteoporosis 03/27/2008   Bilateral leg cramps 11/25/2006   Panhypopituitarism of anterior  and posterior pituitary 08/08/2006   Hyperlipidemia 08/08/2006   Attention deficit disorder 08/08/2006   Seizure disorder (HCC) 08/08/2006    ONSET DATE: Referred on 10/31/24   REFERRING DIAG: I63.9 (ICD-10-CM) - Left basal ganglia embolic stroke (HCC)   THERAPY DIAG:  Dysarthria and anarthria  Cognitive communication deficit  Rationale for Evaluation and Treatment: Rehabilitation  SUBJECTIVE:   SUBJECTIVE STATEMENT: We may just finish up with Lupita and then discharge until you return.  Pt accompanied by: self  PERTINENT HISTORY: brain CA 1997, CVA  PAIN:  Are you having pain? No  FALLS: Has patient fallen in last 6 months?  No  LIVING ENVIRONMENT: Lives with: lives with their family; [place] and [place] Lives in: House/apartment  PLOF:  Level of assistance: Needed assistance with ADLs, Needed assistance with IADLS Employment: On disability; since CA @ 15  PATIENT GOALS: speech  OBJECTIVE:  Note: Objective measures were completed at Evaluation unless otherwise noted.  DIAGNOSTIC FINDINGS: :Per EMR  MR BRAIN W WO CONTRAST  Date: 10/04/2024   IMPRESSION: 1. 8 mm acute infarction in the posterior limb of the internal capsule on the left. 2. Chronic postsurgical changes in the suprasellar region and hypothalamus. No evidence of recurrent neoplasm. 3. Chronic small-vessel ischemic changes elsewhere throughout the brain as outlined above. 4. 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  COGNITION: Overall cognitive status: Impaired: Areas of impairment:  Attention: Impaired: Selective, Alternating, Divided Memory: Impaired: Short term Prospective Auditory Awareness: Impaired: Emergent Executive function: Impaired: Impulse control, Problem solving, Organization, Planning, Error awareness, Self-correction, and Slow processing  and History of cognitive impairments - at baseline Functional deficits: See clinical impression  COGNITIVE  COMMUNICATION: Following directions: Follows multi-step commands inconsistently  Auditory comprehension: WFL Verbal expression: WFL Functional communication: Impaired: recall of information in conversations   ORAL MOTOR EXAMINATION: Overall status: Impaired;  Comments: Facial ROM: Reduced left;Suspected CN VII (facial) dysfunction Facial Symmetry: Abnormal symmetry left;Suspected CN VII (facial) dysfunction Lingual ROM: Other (Comment) (imprecise) Lingual Symmetry: Within Functional Limits   MOTOR SPEECH: Overall motor speech: impaired Level of impairment: Phrase, Sentence, and Conversation Respiration: thoracic breathing Phonation: normal Resonance: WFL Articulation: Impaired: phrase, sentence, and conversation Intelligibility: Intelligibility reduced ~ 25% to unfamiliar listener Motor planning: Appears intact  Effective technique: slow rate, increased vocal intensity, pause, and pacing   STANDARDIZED ASSESSMENTS:   Cognitive Linguistic Quick Test: AGE - 18 - 69   The Cognitive Linguistic Quick Test (CLQT) was administered to assess the relative status of five cognitive domains: attention, memory, language, executive functioning, and visuospatial skills. Scores from 10 tasks were used to estimate severity ratings (standardized for age groups 18-69 years and 70-89 years) for each domain, a clock drawing task, as well as an overall composite severity rating of cognition.       Task Score Criterion Cut Scores  Personal Facts 8/8 8  Symbol Cancellation 9/12 11  Confrontation Naming 10/10 10  Clock Drawing  6/13 12  Story Retelling 4/10 6  Symbol Trails 1/10 9  Generative Naming 3/9 5  Design Memory 2/6 5  Mazes  0/8 7  Design Generation 3/13 6    Cognitive Domain Composite Score Severity Rating  Attention 99/215 Moderate  Memory 103/185 Severe  Executive Function 9/40 Severe  Language 25/37 Mild  Visuospatial Skills 35/105 Severe  Clock Drawing  6/13 Severe   Composite Severity Rating  Moderate    PATIENT REPORTED OUTCOME MEASURES (PROM):   Neuro- QOL - Cognitive Function PROM   -Demone: 61 - Mom: 37                                                                                                                            TREATMENT DATE:   11/22/23: Pt was seen for skilled ST services targeting intelligibility strategies. Mother reports they have not had time to practice at home, but she has been reminding him to slow down. SLP facilitated session by reading functional phrases. SLP trialed pacing by tapping leg/table, as well as, increasing loudness. Overarticulation is too difficult at this time. Intelligibility increased significantly with increased loudness and slowed rate. This did not transfer over into speech today. Continued practice on strategies at reading (phrase) level. SLP provided pt with HEP (phrases list).   *Pt mother reported they do not want to seek services at Neuro at this time. To follow up with PRN SLP for 3 visits and decide next steps.   11/10/24: Completed CLQT - see above for scores. Reviewed with mom and patient. No questions at this time. Mother reports her main focus is communication and is wanting to focus speech intelligibility at this time. SLP encouraged mom to think about memory strategies as well as pt is completely dependent on parents. Mom open to working on memory. OT to assist with facilitating cognition within ADLs. SLP provided pt with speech intelligibility strategies re: SLOP today. To focus on over-articulation.   **Neuro is likely closest. Pt would likely seek ST at different location - but would cont OT/PT here.**    PATIENT EDUCATION: Education details: cognitive-communication Person educated: Patient and Parent Education method: Explanation Education comprehension: verbalized understanding   GOALS: Goals reviewed with patient? Yes  SHORT TERM GOALS: Target date: 12/05/24  Complete PROM,  testing, and update goals Baseline: Goal status: MET  2.  Pt will verbalize 2 strategies to improve speech intelligibility.  Baseline:  Goal status: PROGRESSING  3.  Pt will recall the following Mneumonic  STPD (STOP THINK PLAN DO).  Baseline:  Goal status: INITIAL    LONG TERM GOALS: Target date: 01/05/25  Pt will improve score on PROM Baseline:  Goal status: INITIAL  2.  Pt/mom will report use of external memory strategies  Baseline:  Goal status: INITIAL  3.  Pt will demonstrate use of 1 speech intelligibility strategy in structured conversation with minA verbal cues.  Baseline:  Goal status: INITIAL   ASSESSMENT:  CLINICAL IMPRESSION: Pt is a 44 yo male who presented to ST OP for evaluation post CVA. SEE TX NOTE.   @ eval: Pt was accompanied by mother  for today's evaluation. Pt with baseline deficits following brain CA in 1997. Mom reports changes in thinking skills post CVA re: impulsivity (specifically with eating/movement), short term memory, and focus, as well as, and speech.  Pt was assessed using CLQT and informal speech measures. Pt speech observed to be around 25% intelligible to an unfamiliar listener and c/b re: rapid rate, imprecise articulation. SLP attempted to pace patient with asking close-ended questions, which appeared to be helpful. Pt required cueing to wait during assessment, as pt would try to take pen from therapist hand while she was explaining/demonstrating directions.  He also benefited from repetition throughout evaluation. SLP rec skilled ST services to address dysarthria and cognitive-communication impairment.    OBJECTIVE IMPAIRMENTS: include attention, memory, awareness, executive functioning, and dysarthria. These impairments are limiting patient from managing medications, managing appointments, managing finances, household responsibilities, ADLs/IADLs, and effectively communicating at home and in community. Factors affecting potential to  achieve goals and functional outcome are ability to learn/carryover information 2/2 to memory impairment. Patient will benefit from skilled SLP services to address above impairments and improve overall function.  REHAB POTENTIAL: Fair 2/2 to memory impairment; however, great caregiver support.   PLAN:  SLP FREQUENCY: 2x/week  SLP DURATION: 8 weeks  PLANNED INTERVENTIONS: Environmental controls, Cueing hierachy, SLP instruction and feedback, Compensatory strategies, Patient/family education, and [id number] Treatment of speech (30 or 45 min)     [employer], CCC-SLP 11/21/2024, 11:03 AM          "

## 2024-11-21 NOTE — Progress Notes (Signed)
 Internal Medicine Clinic Attending  Case discussed with the resident at the time of the visit.  We reviewed the resident's history and exam and pertinent patient test results.  I agree with the assessment, diagnosis, and plan of care documented in the resident's note.

## 2024-11-21 NOTE — Therapy (Signed)
 " OUTPATIENT PHYSICAL THERAPY NEURO TREATMENT    Patient Name: Nicholas Holt MRN: [id number] DOB:Sep 24, 1981, 44 y.o., male Today's Date: 11/21/2024   PCP: Ronnald Sergeant, MD REFERRING PROVIDER: Toribio Pitch, PA-C  END OF SESSION:  PT End of Session - 11/21/24 1036     Visit Number 3    Number of Visits 14    Date for Recertification  01/12/25    Progress Note Due on Visit 10    PT Start Time 1019    PT Stop Time 1057    PT Time Calculation (min) 38 min    Activity Tolerance Patient tolerated treatment well    Behavior During Therapy Impulsive;WFL for tasks assessed/performed;Restless            Past Medical History:  Diagnosis Date   Anxiety    Attention deficit disorder 08/08/2006   Bilateral leg cramps 11/25/2006   Blood transfusion without reported diagnosis 1997   Chemotherapy associated   Brain cancer (HCC) 1998   Cough 12/08/2015   COVID-19 10/16/2017   Elevated hemidiaphragm 07/09/2015   Left, presumably after portacath placement.    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  Complicated by left sided motor tic disorder and panhypopituitarism.    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  Includes central diabetes insipidus, secondary hypothyroidism, secondary adrenal insufficiency, and secondary hypogonadism.    Perennial allergic rhinitis 07/09/2015   Pre-diabetes    no meds   Preventative health care 07/09/2015    Seizure disorder (HCC) 08/08/2006   Predated brain tumor.  Generalized seizure ocurred after running out of lorazepam  post-tumor.    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 Treated with alendronate  from 2009-2016. DEXA (03/16/2008): L Spine T -2.0. L fem neck T -0.9, R fem neck T -1.9.  DEXA (10/15/2014): L Spine T -1.3, L fem neck T -0.6, R fem neck T -1.8.  Bisphosphonate holiday 06/2015.  Repeat DEXA scan in 10/2016 to reassess.    Weakness 09/15/2023   Past Surgical History:  Procedure Laterality Date   BRAIN BIOPSY  11/14/1995   CSF SHUNT  11/14/1995   PORTACATH PLACEMENT     portacath removal     RADIOLOGY WITH ANESTHESIA N/A 08/30/2021   Procedure: MRI WITH ANESTHESIA BRAIN WITH AND WITHOUT CONTRAST;  Surgeon: Radiologist, Medication, MD;  Location: [facility] OR;  Service: Radiology;  Laterality: N/A;   RADIOLOGY WITH ANESTHESIA N/A 05/25/2022   Procedure: MRI WITH ANESTHESIA OF BRAIN WITH AND WITHOUT CONTRAST;  Surgeon: Radiologist, Medication, MD;  Location: [facility] OR;  Service: Radiology;  Laterality: N/A;   RADIOLOGY WITH ANESTHESIA N/A 10/05/2024   Procedure: MRI WITH ANESTHESIA;  Surgeon: Radiologist, Medication, MD;  Location: [facility] OR;  Service: Radiology;  Laterality: N/A;   Patient Active Problem List   Diagnosis Date Noted   Left basal ganglia embolic stroke (HCC) 10/10/2024   Small vessel stroke (HCC) 10/10/2024   Gait abnormality 10/03/2024   Diarrhea 07/08/2024   Functional urinary incontinence 07/08/2024   Class  1 obesity with serious comorbidity and body mass index (BMI) of 30.0 to 30.9 in adult 04/23/2023   Prediabetes 10/10/2022   Elevated CK 10/10/2022   OSA (obstructive sleep apnea) 07/06/2022   Multiple lacunar infarcts (HCC) 05/04/2022   Hypertension 12/09/2019   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   Elevated hemidiaphragm 07/09/2015   Perennial allergic rhinitis 07/09/2015   H/O malignant neoplasm of brain 09/22/2014    Steroid-induced osteoporosis 03/27/2008   Bilateral leg cramps 11/25/2006   Panhypopituitarism of anterior and posterior pituitary 08/08/2006   Hyperlipidemia 08/08/2006   Attention deficit disorder 08/08/2006   Seizure disorder (HCC) 08/08/2006    ONSET DATE: 10/03/24  REFERRING DIAG: I63.9 (ICD-10-CM) - Left basal ganglia embolic stroke (HCC)  THERAPY DIAG:  Muscle weakness (generalized)  Other lack of coordination  Rationale for Evaluation and Treatment: Rehabilitation  SUBJECTIVE:                                                                                                                                                                                             SUBJECTIVE STATEMENT:   Went to endocrinologist who said he's walking better than he was before the stroke. OT said to hold off on the walker, CIR therapists said no RW too.    EVAL: Pt presents with his mother for PT eval. States that on 10/03/24 she noted decreased balance, pt attempting to use the wall for balance and was decreased from prior functional baseline. Pt denies pain in the last week, not likely to report per mother, but she does not believe he has been having any pain. Inquires about [name] shoe, already ordered, pt has slide plate on L toe of the shoe to assist with mitigating fall risk. Pt ind with cog assist prior to CVA, requires CGA for walking currently. Trialed hemiwalker, not indicated with prior PT.  Pt accompanied by: family member  PERTINENT HISTORY:   From Acute Care Eval: 10/06/24 Pt admitted with above diagnosis. At baseline, pt ambulatory without AD and enjoys going to the gym and bowling.  He does live with mother and father who assist with iadls and other task due to some cognitive and mobility deficits from prior CVA but still is fairly independent.  Pt with L hand contracture and L ankle contracture wearing AFO from prior CVA after brain tumor.  Today, pt requiring mod-max A for  transfers due to posterior lean.  He did ambulate 74' with PT but with mod A and unsafe gait pattern requiring frequent cues and stopping to regain balance and get close to RW, AFO in place.  Pt's strength difficult to  assess completely due to unable to balance while sitting EOB, does appear to be slightly weaker on L compared to R - however, that may be baseline.  From CIR Eval: 10/11/24 Nicholas Holt is a 44 year old male with history of suprasellar germinoma s/p chemo and XRT '97 with residual Left hemiplegia cognitive deficits, panhypopituitarism, anxiety d/o, ADD who was admitted on 10/03/24 with 4 day history of difficulty walking and worsening of dysarthria. Family reported recent change of DDAVP  from intranasal to oral route and was advised to present to ED by endocrinology due to concerns of sodium level which was 129 at admission. CT head negative for acute changes and showed right burr hole from prior ventric with anterior right frontal encephalomalacia and chronic encephalomalacia left basal ganglia with finding c/w Fahr disease. Neurology consulted and MRI brain done revealing 8 mm acute infarct in posterior limb left internal capsule. CTA brain was negative for LVO. Dr. Jerri felt that stroke likely related to small vessel radiation related vasculopathy and recommended DAPT X 3 week followed by Plavix  alone.  PT note 10/30/24 Pt performs sit to stand and ambulates x50' to toilet with CGA and no AD with cues to increase Lt stride length to improve reciprocal gait pattern and balance. Following, pt ambulates x50' back to [facility] with same cues. Pt completes multiple additional bouts of ambulation to work on endurance, balance, and coordination, completing x100', x150', and x75' with seated rest breaks. Pt also completes 4 6 steps with Rt handrail and cues for safe step sequencing and positioning, with CGA provided overall.    PAIN:  Are you having pain? No 0/10 today   PRECAUTIONS: Fall and  Other: Left sided weakness and hemiplegia   RED FLAGS: None   WEIGHT BEARING RESTRICTIONS: No  FALLS: Has patient fallen in last 6 months? No  LIVING ENVIRONMENT: Lives with: lives with their family Lives in: House/apartment Stairs: Yes: Internal: flight steps; on right going up and External: 3 steps; none Has following equipment at home: Single point cane  PLOF: Independent  PATIENT GOALS: return to PLOF, helping with groceries, bowling  OBJECTIVE:  Note: Objective measures were completed at Evaluation unless otherwise noted.  DIAGNOSTIC FINDINGS:   IMPRESSION: 10/05/24 1. 8 mm acute infarction in the posterior limb of the internal capsule on the left. 2. Chronic postsurgical changes in the suprasellar region and hypothalamus. No evidence of recurrent neoplasm. 3. Chronic small-vessel ischemic changes elsewhere throughout the brain as outlined above. 4. 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  COGNITION: Overall cognitive status: History of cognitive impairments - at baseline   SENSATION: WFL  COORDINATION: Decreased with inc velocity of movements bilaterally, L>R, finger/nose, finger/finger, stepping pattern in sitting (R, L, R, L) able to improve with decreased cadence  EDEMA:  Slight increase in edema of the L ankle, retromalleolar  MUSCLE TONE: LLE: left flexion contracture of L wrist, slight inc from baseline. Mild hypertonicity in LLE with quick movements, WNL if moves slowly    DTRs:  Biceps 1 = Trace , Brachioradialis 1 = Trace , Triceps 1 = Trace , Patella 1 = Trace , and Achilles 1 = Trace   POSTURE: rounded shoulders, forward head, weight shift right, and weight shift left  LOWER EXTREMITY ROM:     Active  Right Eval Left Eval  Hip flexion WNL 114  Hip extension    Hip abduction 43 40  Hip adduction WNL WNL  Hip internal rotation  Hip external rotation    Knee flexion 125 118  Knee extension -4 -10   Ankle dorsiflexion 19 12  Ankle plantarflexion 30 26  Ankle inversion    Ankle eversion     (Blank rows = not tested)  LOWER EXTREMITY MMT:    MMT Right Eval Left Eval  Hip flexion 4/5 3+/5  Hip extension    Hip abduction 4+/5 3/5  Hip adduction 4+/5 4+/5  Hip internal rotation    Hip external rotation    Knee flexion 4/5 3-/5  Knee extension 4/5 3/5  Ankle dorsiflexion 3+/5 2/5  Ankle plantarflexion 3+/5 3/5  Ankle inversion    Ankle eversion    (Blank rows = not tested)   TRANSFERS: Sit to stand: SBA  Assistive device utilized: None      GAIT: Findings: Distance walked: 197ft, Assistive device utilized:Walker - 2 wheeled and None, Level of assistance: CGA, and Comments: frequent cues for pacing as pt is impulsive and attempted to complete increased cadence, with fatigue he is unable to maintain LLE demands from velocity, safety awareness deficits place him at inc risk of falls. Pt cued for step height and clearance during LLE swing phase, unable to maintain without cues and unable to maintain with inc fatigue. Cued for pacing at this point with improved safety taking smaller steps.                                                                                                                               TREATMENT DATE:   11/21/24  Forward step ups 6 inch step L, mod cues for sequencing/safety, U UE support STS with 7# wt alternating between L hand and B hands on dumb-bell x10  Nustep L5x8 minutes all four extremities seat 15 Gait training 126ft with 1.5# each LE, cues to slow down and for longer step length L   Standing on blue foam pad EC/close min guard 3x30 seconds Side steps on blue foam x2 laps down and back UEs on bar (tried to back down to 1 but unable)       11/18/24   Gait assessment and training in Cadence shoes by themselves (had more of a shuffle gait pattern with poor toe clearance and anterior translation of wt/short fast steps), Cadence shoes +  AFO (turned into more of a steppage gait pattern with poor motor control L LE), regular shoes + AFO (had best foot clearance although gait pattern certainly was not perfect)- additionally Cadence shoe toe box was a bit too long and too narrow for his forefoot, making risk of tripping and skin breakdown fairly high. Recommended they use regular shoes and AFO for now.  Nustep L5x8 minutes all four extremities  seat 15 Wide tandem  blue foam pad 2x30 seconds B  Forward step downs 6 inch step very close min guard x10 B    11/03/24- EVAL    PATIENT EDUCATION: Education details: Pt educated on relevant  anatomy, physiology, pathology, diagnosis, prognosis, progression of care, pain and activity modification related to s/p CVA 10/03/24 Person educated: Patient and Parent Education method: Explanation, Demonstration, and Handouts Education comprehension: verbalized understanding, returned demonstration, verbal cues required, and needs further education  HOME EXERCISE PROGRAM: Access Code: [email or web address] URL: [email or web address] Date: 11/03/2024 Prepared by: Stann Ohara  Exercises - Seated Hip Flexion March with Ankle Weights  - 2 x daily - 7 x weekly - 3 sets - 15 reps - 2 hold - Seated Long Arc Quad  - 2 x daily - 7 x weekly - 3 sets - 15 reps - 2 hold - Seated Heel Raise  - 2 x daily - 7 x weekly - 3 sets - 15 reps - 2 hold  GOALS: Goals reviewed with patient? Yes  SHORT TERM GOALS: Target date: 12/11/24   Pt will report compliance with HEP to work towards ind and home management strategies Baseline: Goal status: INITIAL   2.  Pt will amb x392ft with LRAD at supervision assist or less to demonstrate improved activity tolerance and safety with home mobility Baseline:  Goal status: INITIAL   3.  Pt will improve L LE AROM to full and painless in order to demonstrate progress towards activity tolerance and improved function Baseline:  Goal status: INITIAL  4.  Pt will  demonstrate stair negotiation using non reciprocal pattern x1 flight with right handrail per home setup with CGA and demonstrate safety recall requiring caregiver presence to assist with steps Baseline: 2 steps into the house, mod A  Goal status: INITIAL    LONG TERM GOALS: Target date: 01/12/25   Pt will amb x73ft with LRAD at supervision assist or less to demonstrate improved activity tolerance and safety with home mobility Baseline:  Goal status: INITIAL   2.  Pt will demonstrate 4/5 strength or greater in LLE to demonstrate improved strength and stability Baseline:  Goal status: INITIAL   3.  Pt will be ind in the management of their symptoms at home and in the community Baseline:  Goal status: INITIAL   4.  Pt will complete unilateral carry of 5 pounds or greater a distance of 171ft to mimic assisting with groceries  Baseline:  Goal status: INITIAL     ASSESSMENT:  CLINICAL IMPRESSION:   Arrived doing well today, continues to move well with AFO and regular shoes. Focused on functional balance and strengthening today as tolerated, needed ongoing mod cues for physical performance during session as well as cues to slow down speech so PT could understand him. Fatigued at EOS. I do not recommend RW at this time, would benefit more from us  working to improve gait pattern without device.    EVAL: Patient is a 44 y.o. M who was seen today for physical therapy evaluation and treatment for s/p CVA 10/03/24 with L sided deficits. Pt has baseline deficits in the L hemisphere due to brain tumor at age 45. Prior to this event, he was able to participate in grocery shopping and home activities, amb without AD. Pt currently demonstrates inc impulsivity with safety awareness concerns requiring frequent cues. Pt and family educated on maintained safety. Pt stands to benefit from continued skilled physical therapy to address deficit areas and restore safety with activities and participations at  home and in the community.     OBJECTIVE IMPAIRMENTS: Abnormal gait, decreased activity tolerance, decreased balance, decreased cognition, decreased coordination, decreased endurance, decreased mobility, difficulty walking, decreased ROM, decreased strength, decreased safety awareness, increased edema,  impaired tone, impaired UE functional use, impaired vision/preception, and postural dysfunction.   ACTIVITY LIMITATIONS: carrying, lifting, bending, standing, squatting, stairs, bathing, dressing, self feeding, and locomotion level  PARTICIPATION LIMITATIONS: shopping and community activity  PERSONAL FACTORS: Behavior pattern, Fitness, Past/current experiences, and Time since onset of injury/illness/exacerbation are also affecting patient's functional outcome.   REHAB POTENTIAL: Good  CLINICAL DECISION MAKING: Evolving/moderate complexity  EVALUATION COMPLEXITY: Moderate  PLAN:  PT FREQUENCY: 1-2x/week  PT DURATION: 10 weeks  PLANNED INTERVENTIONS: 97110-Therapeutic exercises, 97530- Therapeutic activity, [id number]- Neuromuscular re-education, 97535- Self Care, [id number]- Manual therapy, [id number]- Gait training, [phone]- Orthotic Initial, [phone]- Orthotic/Prosthetic subsequent, [id number]- Electrical stimulation (unattended), Patient/Family education, and Wheelchair mobility training  PLAN FOR NEXT SESSION: progress motor control, gait and balance, strength, stability, mobility, endurance through functional movement patterns. Work on increasing L step length/L LE motor control   Josette Rough, PT, DPT 11/21/2024 10:57 AM         "

## 2024-11-24 ENCOUNTER — Ambulatory Visit: Admitting: Physical Therapy

## 2024-11-24 ENCOUNTER — Ambulatory Visit: Admitting: Occupational Therapy

## 2024-11-24 ENCOUNTER — Encounter: Payer: Self-pay | Admitting: Physical Therapy

## 2024-11-24 DIAGNOSIS — R41840 Attention and concentration deficit: Secondary | ICD-10-CM

## 2024-11-24 DIAGNOSIS — R2689 Other abnormalities of gait and mobility: Secondary | ICD-10-CM

## 2024-11-24 DIAGNOSIS — M25622 Stiffness of left elbow, not elsewhere classified: Secondary | ICD-10-CM

## 2024-11-24 DIAGNOSIS — M6281 Muscle weakness (generalized): Secondary | ICD-10-CM

## 2024-11-24 DIAGNOSIS — R278 Other lack of coordination: Secondary | ICD-10-CM

## 2024-11-24 DIAGNOSIS — M25632 Stiffness of left wrist, not elsewhere classified: Secondary | ICD-10-CM

## 2024-11-24 DIAGNOSIS — R41842 Visuospatial deficit: Secondary | ICD-10-CM

## 2024-11-24 DIAGNOSIS — R29818 Other symptoms and signs involving the nervous system: Secondary | ICD-10-CM

## 2024-11-24 NOTE — Therapy (Signed)
 " OUTPATIENT PHYSICAL THERAPY NEURO TREATMENT    Patient Name: Nicholas Holt MRN: [id number] DOB:27-Oct-1981, 44 y.o., male Today's Date: 11/24/2024   PCP: Ronnald Sergeant, MD REFERRING PROVIDER: Toribio Pitch, PA-C  END OF SESSION:  PT End of Session - 11/24/24 1357     Visit Number 4    Number of Visits 14    Date for Recertification  01/12/25    Progress Note Due on Visit 10    PT Start Time 1353    PT Stop Time 1431    PT Time Calculation (min) 38 min    Activity Tolerance Patient tolerated treatment well    Behavior During Therapy Impulsive;WFL for tasks assessed/performed;Restless             Past Medical History:  Diagnosis Date   Anxiety    Attention deficit disorder 08/08/2006   Bilateral leg cramps 11/25/2006   Blood transfusion without reported diagnosis 1997   Chemotherapy associated   Brain cancer (HCC) 1998   Cough 12/08/2015   COVID-19 10/16/2017   Elevated hemidiaphragm 07/09/2015   Left, presumably after portacath placement.    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  Complicated by left sided motor tic disorder and panhypopituitarism.    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  Includes central diabetes insipidus, secondary hypothyroidism, secondary adrenal insufficiency, and secondary hypogonadism.    Perennial allergic rhinitis 07/09/2015   Pre-diabetes    no meds   Preventative health care 07/09/2015    Seizure disorder (HCC) 08/08/2006   Predated brain tumor.  Generalized seizure ocurred after running out of lorazepam  post-tumor.    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 Treated with alendronate  from 2009-2016. DEXA (03/16/2008): L Spine T -2.0. L fem neck T -0.9, R fem neck T -1.9.  DEXA (10/15/2014): L Spine T -1.3, L fem neck T -0.6, R fem neck T -1.8.  Bisphosphonate holiday 06/2015.  Repeat DEXA scan in 10/2016 to reassess.    Weakness 09/15/2023   Past Surgical History:  Procedure Laterality Date   BRAIN BIOPSY  11/14/1995   CSF SHUNT  11/14/1995   PORTACATH PLACEMENT     portacath removal     RADIOLOGY WITH ANESTHESIA N/A 08/30/2021   Procedure: MRI WITH ANESTHESIA BRAIN WITH AND WITHOUT CONTRAST;  Surgeon: Radiologist, Medication, MD;  Location: [facility] OR;  Service: Radiology;  Laterality: N/A;   RADIOLOGY WITH ANESTHESIA N/A 05/25/2022   Procedure: MRI WITH ANESTHESIA OF BRAIN WITH AND WITHOUT CONTRAST;  Surgeon: Radiologist, Medication, MD;  Location: [facility] OR;  Service: Radiology;  Laterality: N/A;   RADIOLOGY WITH ANESTHESIA N/A 10/05/2024   Procedure: MRI WITH ANESTHESIA;  Surgeon: Radiologist, Medication, MD;  Location: [facility] OR;  Service: Radiology;  Laterality: N/A;   Patient Active Problem List   Diagnosis Date Noted   Left basal ganglia embolic stroke (HCC) 10/10/2024   Small vessel stroke (HCC) 10/10/2024   Gait abnormality 10/03/2024   Diarrhea 07/08/2024   Functional urinary incontinence 07/08/2024  Class 1 obesity with serious comorbidity and body mass index (BMI) of 30.0 to 30.9 in adult 04/23/2023   Prediabetes 10/10/2022   Elevated CK 10/10/2022   OSA (obstructive sleep apnea) 07/06/2022   Multiple lacunar infarcts (HCC) 05/04/2022   Hypertension 12/09/2019   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   Elevated hemidiaphragm 07/09/2015   Perennial allergic rhinitis 07/09/2015   H/O malignant neoplasm of brain 09/22/2014    Steroid-induced osteoporosis 03/27/2008   Bilateral leg cramps 11/25/2006   Panhypopituitarism of anterior and posterior pituitary 08/08/2006   Hyperlipidemia 08/08/2006   Attention deficit disorder 08/08/2006   Seizure disorder (HCC) 08/08/2006    ONSET DATE: 10/03/24  REFERRING DIAG: I63.9 (ICD-10-CM) - Left basal ganglia embolic stroke (HCC)  THERAPY DIAG:  Muscle weakness (generalized)  Other lack of coordination  Rationale for Evaluation and Treatment: Rehabilitation  SUBJECTIVE:                                                                                                                                                                                             SUBJECTIVE STATEMENT:  Doing good today, nothing new.     EVAL: Pt presents with his mother for PT eval. States that on 10/03/24 she noted decreased balance, pt attempting to use the wall for balance and was decreased from prior functional baseline. Pt denies pain in the last week, not likely to report per mother, but she does not believe he has been having any pain. Inquires about [name] shoe, already ordered, pt has slide plate on L toe of the shoe to assist with mitigating fall risk. Pt ind with cog assist prior to CVA, requires CGA for walking currently. Trialed hemiwalker, not indicated with prior PT.  Pt accompanied by: family member  PERTINENT HISTORY:   From Acute Care Eval: 10/06/24 Pt admitted with above diagnosis. At baseline, pt ambulatory without AD and enjoys going to the gym and bowling.  He does live with mother and father who assist with iadls and other task due to some cognitive and mobility deficits from prior CVA but still is fairly independent.  Pt with L hand contracture and L ankle contracture wearing AFO from prior CVA after brain tumor.  Today, pt requiring mod-max A for transfers due to posterior lean.  He did ambulate 68' with PT but with mod A and unsafe gait pattern requiring frequent cues  and stopping to regain balance and get close to RW, AFO in place.  Pt's strength difficult to assess completely due to unable to balance while sitting EOB, does appear to be slightly weaker on L compared to R -  however, that may be baseline.  From CIR Eval: 10/11/24 Nicholas Holt is a 44 year old male with history of suprasellar germinoma s/p chemo and XRT '97 with residual Left hemiplegia cognitive deficits, panhypopituitarism, anxiety d/o, ADD who was admitted on 10/03/24 with 4 day history of difficulty walking and worsening of dysarthria. Family reported recent change of DDAVP  from intranasal to oral route and was advised to present to ED by endocrinology due to concerns of sodium level which was 129 at admission. CT head negative for acute changes and showed right burr hole from prior ventric with anterior right frontal encephalomalacia and chronic encephalomalacia left basal ganglia with finding c/w Fahr disease. Neurology consulted and MRI brain done revealing 8 mm acute infarct in posterior limb left internal capsule. CTA brain was negative for LVO. Dr. Jerri felt that stroke likely related to small vessel radiation related vasculopathy and recommended DAPT X 3 week followed by Plavix  alone.  PT note 10/30/24 Pt performs sit to stand and ambulates x50' to toilet with CGA and no AD with cues to increase Lt stride length to improve reciprocal gait pattern and balance. Following, pt ambulates x50' back to [facility] with same cues. Pt completes multiple additional bouts of ambulation to work on endurance, balance, and coordination, completing x100', x150', and x75' with seated rest breaks. Pt also completes 4 6 steps with Rt handrail and cues for safe step sequencing and positioning, with CGA provided overall.    PAIN:  Are you having pain? No 0/10 today, consistent with non-verbal communication  PRECAUTIONS: Fall and Other: Left sided weakness and hemiplegia   RED FLAGS: None   WEIGHT BEARING  RESTRICTIONS: No  FALLS: Has patient fallen in last 6 months? No  LIVING ENVIRONMENT: Lives with: lives with their family Lives in: House/apartment Stairs: Yes: Internal: flight steps; on right going up and External: 3 steps; none Has following equipment at home: Single point cane  PLOF: Independent  PATIENT GOALS: return to PLOF, helping with groceries, bowling  OBJECTIVE:  Note: Objective measures were completed at Evaluation unless otherwise noted.  DIAGNOSTIC FINDINGS:   IMPRESSION: 10/05/24 1. 8 mm acute infarction in the posterior limb of the internal capsule on the left. 2. Chronic postsurgical changes in the suprasellar region and hypothalamus. No evidence of recurrent neoplasm. 3. Chronic small-vessel ischemic changes elsewhere throughout the brain as outlined above. 4. 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  COGNITION: Overall cognitive status: History of cognitive impairments - at baseline   SENSATION: WFL  COORDINATION: Decreased with inc velocity of movements bilaterally, L>R, finger/nose, finger/finger, stepping pattern in sitting (R, L, R, L) able to improve with decreased cadence  EDEMA:  Slight increase in edema of the L ankle, retromalleolar  MUSCLE TONE: LLE: left flexion contracture of L wrist, slight inc from baseline. Mild hypertonicity in LLE with quick movements, WNL if moves slowly    DTRs:  Biceps 1 = Trace , Brachioradialis 1 = Trace , Triceps 1 = Trace , Patella 1 = Trace , and Achilles 1 = Trace   POSTURE: rounded shoulders, forward head, weight shift right, and weight shift left  LOWER EXTREMITY ROM:     Active  Right Eval Left Eval  Hip flexion WNL 114  Hip extension    Hip abduction 43 40  Hip adduction WNL WNL  Hip internal rotation    Hip external rotation    Knee flexion 125 118  Knee extension -4 -10  Ankle dorsiflexion  19 12  Ankle plantarflexion 30 26  Ankle inversion    Ankle  eversion     (Blank rows = not tested)  LOWER EXTREMITY MMT:    MMT Right Eval Left Eval  Hip flexion 4/5 3+/5  Hip extension    Hip abduction 4+/5 3/5  Hip adduction 4+/5 4+/5  Hip internal rotation    Hip external rotation    Knee flexion 4/5 3-/5  Knee extension 4/5 3/5  Ankle dorsiflexion 3+/5 2/5  Ankle plantarflexion 3+/5 3/5  Ankle inversion    Ankle eversion    (Blank rows = not tested)   TRANSFERS: Sit to stand: SBA  Assistive device utilized: None      GAIT: Findings: Distance walked: 118ft, Assistive device utilized:Walker - 2 wheeled and None, Level of assistance: CGA, and Comments: frequent cues for pacing as pt is impulsive and attempted to complete increased cadence, with fatigue he is unable to maintain LLE demands from velocity, safety awareness deficits place him at inc risk of falls. Pt cued for step height and clearance during LLE swing phase, unable to maintain without cues and unable to maintain with inc fatigue. Cued for pacing at this point with improved safety taking smaller steps.                                                                                                                               TREATMENT DATE:   11/24/24  Nustep L5x8 minutes seat 15, all four extremities   Forward and lateral steps over half foam rolls x4 laps down and back each Alternating forward steps to target with 1.5# each foot to improve step length x20 total   Gait training 283ftx2 1.5# each LE, cues to slow down and improve step length/stance time, then 268ft with 0# with same cues  Forward/backward walking in bars x4 laps cues for large steps      11/21/24  Forward step ups 6 inch step L, mod cues for sequencing/safety, U UE support STS with 7# wt alternating between L hand and B hands on dumb-bell x10  Nustep L5x8 minutes all four extremities seat 15 Gait training 140ft with 1.5# each LE, cues to slow down and for longer step length L   Standing on blue  foam pad EC/close min guard 3x30 seconds Side steps on blue foam x2 laps down and back UEs on bar (tried to back down to 1 but unable)       11/18/24   Gait assessment and training in Cadence shoes by themselves (had more of a shuffle gait pattern with poor toe clearance and anterior translation of wt/short fast steps), Cadence shoes + AFO (turned into more of a steppage gait pattern with poor motor control L LE), regular shoes + AFO (had best foot clearance although gait pattern certainly was not perfect)- additionally Cadence shoe toe box was a bit too long and too narrow for his forefoot, making risk of  tripping and skin breakdown fairly high. Recommended they use regular shoes and AFO for now.  Nustep L5x8 minutes all four extremities  seat 15 Wide tandem  blue foam pad 2x30 seconds B  Forward step downs 6 inch step very close min guard x10 B    11/03/24- EVAL    PATIENT EDUCATION: Education details: Pt educated on relevant anatomy, physiology, pathology, diagnosis, prognosis, progression of care, pain and activity modification related to s/p CVA 10/03/24 Person educated: Patient and Parent Education method: Explanation, Demonstration, and Handouts Education comprehension: verbalized understanding, returned demonstration, verbal cues required, and needs further education  HOME EXERCISE PROGRAM: Access Code: [email or web address] URL: [email or web address] Date: 11/03/2024 Prepared by: Stann Ohara  Exercises - Seated Hip Flexion March with Ankle Weights  - 2 x daily - 7 x weekly - 3 sets - 15 reps - 2 hold - Seated Long Arc Quad  - 2 x daily - 7 x weekly - 3 sets - 15 reps - 2 hold - Seated Heel Raise  - 2 x daily - 7 x weekly - 3 sets - 15 reps - 2 hold  GOALS: Goals reviewed with patient? Yes  SHORT TERM GOALS: Target date: 12/11/24   Pt will report compliance with HEP to work towards ind and home management strategies Baseline: Goal status: INITIAL   2.  Pt will  amb x369ft with LRAD at supervision assist or less to demonstrate improved activity tolerance and safety with home mobility Baseline:  Goal status: INITIAL   3.  Pt will improve L LE AROM to full and painless in order to demonstrate progress towards activity tolerance and improved function Baseline:  Goal status: INITIAL  4.  Pt will demonstrate stair negotiation using non reciprocal pattern x1 flight with right handrail per home setup with CGA and demonstrate safety recall requiring caregiver presence to assist with steps Baseline: 2 steps into the house, mod A  Goal status: INITIAL    LONG TERM GOALS: Target date: 01/12/25   Pt will amb x715ft with LRAD at supervision assist or less to demonstrate improved activity tolerance and safety with home mobility Baseline:  Goal status: INITIAL   2.  Pt will demonstrate 4/5 strength or greater in LLE to demonstrate improved strength and stability Baseline:  Goal status: INITIAL   3.  Pt will be ind in the management of their symptoms at home and in the community Baseline:  Goal status: INITIAL   4.  Pt will complete unilateral carry of 5 pounds or greater a distance of 159ft to mimic assisting with groceries  Baseline:  Goal status: INITIAL     ASSESSMENT:  CLINICAL IMPRESSION:  Arrived today doing well, felt good after last PT session. Continues to require cues to slow down speech so PT could understand him enough to communicate well. Otherwise continued working on functional pre-gait activities and gait training today to try to improve overall safety of mobility. Needs repeated multimodal cues for task performance and focus to task at hand.    EVAL: Patient is a 44 y.o. M who was seen today for physical therapy evaluation and treatment for s/p CVA 10/03/24 with L sided deficits. Pt has baseline deficits in the L hemisphere due to brain tumor at age 53. Prior to this event, he was able to participate in grocery shopping and home  activities, amb without AD. Pt currently demonstrates inc impulsivity with safety awareness concerns requiring frequent cues. Pt and family educated on maintained safety. Pt stands to benefit  from continued skilled physical therapy to address deficit areas and restore safety with activities and participations at home and in the community.     OBJECTIVE IMPAIRMENTS: Abnormal gait, decreased activity tolerance, decreased balance, decreased cognition, decreased coordination, decreased endurance, decreased mobility, difficulty walking, decreased ROM, decreased strength, decreased safety awareness, increased edema, impaired tone, impaired UE functional use, impaired vision/preception, and postural dysfunction.   ACTIVITY LIMITATIONS: carrying, lifting, bending, standing, squatting, stairs, bathing, dressing, self feeding, and locomotion level  PARTICIPATION LIMITATIONS: shopping and community activity  PERSONAL FACTORS: Behavior pattern, Fitness, Past/current experiences, and Time since onset of injury/illness/exacerbation are also affecting patient's functional outcome.   REHAB POTENTIAL: Good  CLINICAL DECISION MAKING: Evolving/moderate complexity  EVALUATION COMPLEXITY: Moderate  PLAN:  PT FREQUENCY: 1-2x/week  PT DURATION: 10 weeks  PLANNED INTERVENTIONS: 97110-Therapeutic exercises, 97530- Therapeutic activity, [id number]- Neuromuscular re-education, 97535- Self Care, [id number]- Manual therapy, [id number]- Gait training, [phone]- Orthotic Initial, [phone]- Orthotic/Prosthetic subsequent, [id number]- Electrical stimulation (unattended), Patient/Family education, and Wheelchair mobility training  PLAN FOR NEXT SESSION: progress motor control, gait and balance, strength, stability, mobility, endurance through functional movement patterns. Work on increasing L step length/L LE motor control for gait mechanics, try to reduce steppage pattern L   Josette Rough, PT, DPT 11/24/2024 2:34 PM         "

## 2024-11-25 ENCOUNTER — Other Ambulatory Visit: Payer: Self-pay | Admitting: Internal Medicine

## 2024-11-25 ENCOUNTER — Ambulatory Visit: Admitting: Occupational Therapy

## 2024-11-25 ENCOUNTER — Ambulatory Visit

## 2024-11-25 DIAGNOSIS — R41842 Visuospatial deficit: Secondary | ICD-10-CM

## 2024-11-25 DIAGNOSIS — M6281 Muscle weakness (generalized): Secondary | ICD-10-CM

## 2024-11-25 DIAGNOSIS — M25622 Stiffness of left elbow, not elsewhere classified: Secondary | ICD-10-CM

## 2024-11-25 DIAGNOSIS — R41840 Attention and concentration deficit: Secondary | ICD-10-CM

## 2024-11-25 DIAGNOSIS — R278 Other lack of coordination: Secondary | ICD-10-CM

## 2024-11-25 DIAGNOSIS — M25632 Stiffness of left wrist, not elsewhere classified: Secondary | ICD-10-CM

## 2024-11-25 DIAGNOSIS — R471 Dysarthria and anarthria: Secondary | ICD-10-CM

## 2024-11-25 DIAGNOSIS — R29818 Other symptoms and signs involving the nervous system: Secondary | ICD-10-CM

## 2024-11-25 DIAGNOSIS — R41841 Cognitive communication deficit: Secondary | ICD-10-CM

## 2024-11-25 DIAGNOSIS — R2689 Other abnormalities of gait and mobility: Secondary | ICD-10-CM

## 2024-11-25 DIAGNOSIS — I639 Cerebral infarction, unspecified: Secondary | ICD-10-CM

## 2024-11-25 MED ORDER — BACLOFEN 10 MG PO TABS: 10.0000 mg | ORAL_TABLET | Freq: Two times a day (BID) | ORAL | 0 refills | Status: DC

## 2024-11-25 NOTE — Telephone Encounter (Signed)
 Copied from CRM [id number]. Topic: Clinical - Medication Refill >> Nov 25, 2024  2:42 PM Debby BROCKS wrote: Medication: clopidogrel  (PLAVIX ) 75 MG tablet baclofen  (LIORESAL ) 10 MG tablet   Has the patient contacted their pharmacy? Yes (Agent: If no, request that the patient contact the pharmacy for the refill. If patient does not wish to contact the pharmacy document the reason why and proceed with request.) (Agent: If yes, when and what did the pharmacy advise?)  Pharmacy states they are waiting for authorization  This is the patient's preferred pharmacy:  [id number] [place], [place] - [address] AT [address] [place] [address] Phone: [phone] Fax: [phone]   Is this the correct pharmacy for this prescription? Yes If no, delete pharmacy and type the correct one.   Has the prescription been filled recently? No  Is the patient out of the medication? No, has only a small amount left  Has the patient been seen for an appointment in the last year OR does the patient have an upcoming appointment? Yes  Can we respond through MyChart? Yes  Agent: Please be advised that Rx refills may take up to 3 business days. We ask that you follow-up with your pharmacy.

## 2024-11-25 NOTE — Patient Instructions (Signed)
 Your Splint This splint should initially be fitted by a healthcare practitioner.  The healthcare practitioner is responsible for providing wearing instructions and precautions to the patient, other healthcare practitioners and care provider involved in the patient's care.  This splint was custom made for you. Please read the following instructions to learn about wearing and caring for your splint.  Precautions Should your splint cause any of the following problems, remove the splint immediately and contact your therapist/physician. Swelling Severe Pain Pressure Areas Stiffness Numbness  Do not wear your splint while operating machinery unless it has been fabricated for that purpose.  When To Wear Your Splint Where your splint according to your therapist/physician instructions. Daytime for 1 hours monitor skin, if no problems you may wear for 1 hour several times a day or increase to 2 hours at a time STOP wear if any problems and bring splint back to therapy  Care and Cleaning of Your Splint Keep your splint away from open flames. Your splint will lose its shape in temperatures over 135 degrees Farenheit, ( in car windows, near radiators, ovens or in hot water).  Never make any adjustments to your splint, if the splint needs adjusting remove it and make an appointment to see your therapist. Your splint, including the cushion liner may be cleaned with soap and lukewarm water.  Do not immerse in hot water over 135 degrees Farenheit.

## 2024-11-25 NOTE — Therapy (Signed)
 " OUTPATIENT OCCUPATIONAL THERAPY NEURO  treatment  Patient Name: Nicholas Holt MRN: [id number] DOB:Sep 04, 1981, 44 y.o., male Today's Date: 11/25/2024  PCP: Karna Fellows MD REFERRING PROVIDER: Dr. Carilyn  END OF SESSION:  OT End of Session - 11/25/24 0848     Visit Number 4    Number of Visits 25    Date for Recertification  01/26/25    Authorization Type [employer] until January then [facility]    Authorization - Visit Number 4    OT Start Time 1448    OT Stop Time 1530    OT Time Calculation (min) 42 min    Behavior During Therapy Impulsive;WFL for tasks assessed/performed;Restless            Past Medical History:  Diagnosis Date   Anxiety    Attention deficit disorder 08/08/2006   Bilateral leg cramps 11/25/2006   Blood transfusion without reported diagnosis 1997   Chemotherapy associated   Brain cancer (HCC) 1998   Cough 12/08/2015   COVID-19 10/16/2017   Elevated hemidiaphragm 07/09/2015   Left, presumably after portacath placement.    Fall 04/13/2022   H/O malignant neoplasm of brain 09/22/2014   Suprasellar germinoma treated at [facility] in 1997; received chemotherapy (cisplatin, etoposide, vincristine, and cyclophosphomide) and radiation therapy.  Complicated by left sided motor tic disorder and panhypopituitarism.    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  Includes central diabetes insipidus, secondary hypothyroidism, secondary adrenal insufficiency, and secondary hypogonadism.    Perennial allergic rhinitis 07/09/2015   Pre-diabetes    no meds   Preventative health care 07/09/2015    Seizure disorder (HCC) 08/08/2006   Predated brain tumor.  Generalized seizure ocurred after running out of lorazepam  post-tumor.    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 Treated with alendronate  from 2009-2016. DEXA (03/16/2008): L Spine T -2.0. L fem neck T -0.9, R fem neck T -1.9.  DEXA (10/15/2014): L Spine T -1.3, L fem neck T -0.6, R fem neck T -1.8.  Bisphosphonate holiday 06/2015.  Repeat DEXA scan in 10/2016 to reassess.    Weakness 09/15/2023   Past Surgical History:  Procedure Laterality Date   BRAIN BIOPSY  11/14/1995   CSF SHUNT  11/14/1995   PORTACATH PLACEMENT     portacath removal     RADIOLOGY WITH ANESTHESIA N/A 08/30/2021   Procedure: MRI WITH ANESTHESIA BRAIN WITH AND WITHOUT CONTRAST;  Surgeon: Radiologist, Medication, MD;  Location: [facility] OR;  Service: Radiology;  Laterality: N/A;   RADIOLOGY WITH ANESTHESIA N/A 05/25/2022   Procedure: MRI WITH ANESTHESIA OF BRAIN WITH AND WITHOUT CONTRAST;  Surgeon: Radiologist, Medication, MD;  Location: [facility] OR;  Service: Radiology;  Laterality: N/A;   RADIOLOGY WITH ANESTHESIA N/A 10/05/2024   Procedure: MRI WITH ANESTHESIA;  Surgeon: Radiologist, Medication, MD;  Location: [facility] OR;  Service: Radiology;  Laterality: N/A;   Patient Active Problem List   Diagnosis Date Noted   Left basal ganglia embolic stroke (HCC) 10/10/2024   Small vessel stroke (HCC) 10/10/2024   Gait abnormality 10/03/2024   Diarrhea 07/08/2024   Functional urinary incontinence 07/08/2024   Class 1 obesity  with serious comorbidity and body mass index (BMI) of 30.0 to 30.9 in adult 04/23/2023   Prediabetes 10/10/2022   Elevated CK 10/10/2022   OSA (obstructive sleep apnea) 07/06/2022   Multiple lacunar infarcts (HCC) 05/04/2022   Hypertension 12/09/2019   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   Elevated hemidiaphragm 07/09/2015   Perennial allergic rhinitis 07/09/2015   H/O malignant neoplasm of brain 09/22/2014    Steroid-induced osteoporosis 03/27/2008   Bilateral leg cramps 11/25/2006   Panhypopituitarism of anterior and posterior pituitary 08/08/2006   Hyperlipidemia 08/08/2006   Attention deficit disorder 08/08/2006   Seizure disorder (HCC) 08/08/2006    ONSET DATE: 10/31/24- referral date  REFERRING DIAG:  I63.9 (ICD-10-CM) - Left basal ganglia embolic stroke (HCC)    THERAPY DIAG:  Muscle weakness (generalized)  Other lack of coordination  Visuospatial deficit  Attention and concentration deficit  Other symptoms and signs involving the nervous system  Stiffness of left wrist, not elsewhere classified  Stiffness of left elbow, not elsewhere classified  Other abnormalities of gait and mobility  Rationale for Evaluation and Treatment: Rehabilitation  SUBJECTIVE:   SUBJECTIVE STATEMENT: Pt denies pain Pt accompanied by: family member  PERTINENT HISTORY: 44 yo M hospitalized 10/04/24 with impaired balance with posterior limb internal capsule infarct. PMH: suprasellar germinoma s/p chemo with left-sided motor disorder, HTN, HLD anxiety, ADD, ataxia, seizure disorder, OSA Pt d/c from CIR  Pt had LUE weakness from braintumor however pt's mom reports weakness is worse since CVA PRECAUTIONS:seizures, fall risk    PAIN:  Are you having pain? no  FALLS: Has patient fallen in last 6 months? No  LIVING ENVIRONMENT: Lives with: lives with their family Lives in: House/apartment Stairs: yes flight  Has following equipment at home: Shower bench  PLOF: Independent with household mobility with device and Needs assistance with ADLs  PATIENT GOALS: improve independence  OBJECTIVE:  Note: Objective measures were completed at Evaluation unless otherwise noted.  HAND DOMINANCE: Right  ADLs:Pt required some assist with ADLs prior to CVA Overall ADLs: Pt/ mom are currrently sleeping downstairs as she is concerned he will wake up and go downstairs on his own Transfers/ambulation  related to ADLs: Eating: mod I  Grooming: supervision UB Dressing: dependent LB Dressing: dependent Toileting: min A Bathing: dependent Tub Shower transfers: has not tried has tub transfer bench Equipment: Transfer tub bench  IADLs:Dependent, PTA pt brought in groceries and vacuumed  Medication management: requires assistance Financial management: dependent    MOBILITY STATUS: minguard/ min a without device  Sitting balance: posterior lean   UPPER EXTREMITY ROM:  LUE weakness  Active ROM Right eval Left eval  Shoulder flexion 115 75  Shoulder abduction    Shoulder adduction    Shoulder extension    Shoulder internal rotation    Shoulder external rotation    Elbow flexion  130  Elbow extension  -40  Wrist flexion  45 at rest, to 65 flexion  Wrist extension  -45,( -20 P/ROM)  Wrist ulnar deviation    Wrist radial deviation    Wrist pronation    Wrist supination    (Blank rows = not tested)    HAND FUNCTION: Grip strength: Right: 32 lbs; Left: 15 lbs  COORDINATION: Box and Blocks:  Right 34blocks, Left 33blocks  SENSATION: Not tested   MUSCLE TONE: LUE: Moderate and Hypertonic, history of spasticity from brain tumor, recent botox while hospitalized   COGNITION: Overall cognitive status: History of cognitive impairments - at baseline  VISION ASSESSMENT: To be further assessed in functional context, pt walks close to wall on right side      OBSERVATIONS: Impulsive gentleman accompanied by his mother                                                                                                                             TREATMENT DATE:  11/24/24- UBE x 5 mins level 1 for conditioning, mod v.c for perfromance speed. Pt performed closed chain shoulder flexion with bilateral UE's reaching for floor. Passive stretch to left wrist in extension. Therapist initated fabrication of a wrist cock up splint. Splint was not completed due to time constraints.     11/18/24- Pt practiced simulated tub bench transfers with min v.c and assistance. Pt/s mom then dad observed and plan to have pt perform at home with assistance. Therapist recommends use of an adjustable shower head too. Cane exercises for shoulder flexion and chest press, mod v.c Using RUE to remove and replace wooden dowel pegs into pegboard with LUE, min v.c Simulated eating task, min v.c for speed UBE x 4 mins level 1 for conditioning.   11/10/24- Pt/ mom report pt donned pants with supervision today. Pt/ mom were instructed in red putty HEP for composite grip and tip pinch, mod v.c and demonstration for bilateral UE's Cane exercises for shoulder flexion, chest press and abduction to right side, mod v.c and facilitation. Placing and removing graded clothespins from container for sustained pinch 1-8# with right and left UE's mod v.c Pt has a old today and he wore a masks for treatment.   11/03/24- eval         PATIENT EDUCATION: Education details  tub bench transfers Person educated: Patient and Parents Education method: Explanation, demonstration, v.c,  Education comprehension: verbalized understanding, returned demonstration, v.c  HOME EXERCISE PROGRAM:  11/10/24- red putty, cane ex   GOALS: Goals reviewed with patient? Yes  SHORT TERM GOALS: Target date: 12/04/24  I with HEP Baseline: Goal status:  ongoing 11/18/24  2.  Pt will consistently donn shirt  with set up Baseline:  Goal status:met  11/18/24  3.  Pt/ caregiver will be I with positioning to minimize risk for LUE contracture  Goal status:  11/18/24 ongoing  4.  Pt will donn pants with min A Baseline:  Goal status: ongoing, pt's mom reports supervison today, will monitor for consistency 11/10/24  5.  Pt will increase bilateral UE functional use as evidenced by increasing box/ blocks score by 3 blocks bilaterally Baseline:  Goal status: INITIAL  6.  Further assess vision and set goal prn. Baseline:   Goal status: INITIAL  LONG TERM GOALS: Target date: 01/26/25  I with updated HEP Baseline:  Goal status: INITIAL  2.  Pt will increase RUE grip strength to 37 # or greater for increased functional use. Baseline:  Goal status: INITIAL  3.  Pt will increase RUE shoulder flexion to 120 for increased functional reach.  Baseline:  Goal status: INITIAL  4.  Pt will increase L shoulder flexion to 85* for increased ease with dressing. Baseline:  Goal status: INITIAL  5.  Pt will perform dressing with supervision/ setup Baseline:  Goal status: INITIAL  6.  Pt will perfrom bathing with min A Baseline:  Goal status: INITIAL  ASSESSMENT:  CLINICAL IMPRESSION: Pt is progressing towards goals. Pt will demonstrate imrpoved wrist positioning with wrist cock up splint. Therapist to complete fabrication and fitting, next visit.  PERFORMANCE DEFICITS: in functional skills including ADLs, IADLs, coordination, dexterity, sensation, ROM, strength, pain, flexibility, Fine motor control, Gross motor control, mobility, balance, endurance, decreased knowledge of precautions, decreased knowledge of use of DME, and UE functional use, cognitive skills including attention, learn, problem solving, safety awareness, thought, and understand, and psychosocial skills including coping strategies, environmental adaptation, habits, interpersonal interactions, and routines and behaviors.   IMPAIRMENTS: are limiting patient from ADLs, IADLs, rest and sleep, play, leisure, and social participation.   CO-MORBIDITIES: may have co-morbidities  that affects occupational performance. Patient will benefit from skilled OT to address above impairments and improve overall function.  MODIFICATION OR ASSISTANCE TO COMPLETE EVALUATION: Min-Moderate modification of tasks or assist with assess necessary to complete an evaluation.  OT OCCUPATIONAL PROFILE AND HISTORY: Detailed assessment: Review of records and additional review of  physical, cognitive, psychosocial history related to current functional performance.  CLINICAL DECISION MAKING: Moderate - several treatment options, min-mod task modification necessary  REHAB POTENTIAL: Good  EVALUATION COMPLEXITY: Moderate    PLAN:  OT FREQUENCY: 2x/week  OT DURATION: 12 weeks  PLANNED INTERVENTIONS: 97168 OT Re-evaluation, 97535 self care/ADL training, 02889 therapeutic exercise, 97530 therapeutic activity, 97112 neuromuscular re-education, 97140 manual therapy, 97035 ultrasound, 97018 paraffin, 02989 moist heat, 97010 cryotherapy, 97129 Cognitive training (first 15 min), 02869 Cognitive training(each additional 15 min), 02249 Physical Performance Testing, 02239 Orthotic Initial, 97763 Orthotic/Prosthetic subsequent, passive range of motion, balance training, functional mobility training, visual/perceptual remediation/compensation, energy conservation, coping strategies training, patient/family education, and DME and/or AE instructions  RECOMMENDED OTHER SERVICES: n/a  CONSULTED AND AGREED WITH PLAN OF CARE: Patient and family member/caregiver  PLAN FOR NEXT SESSION:  complete wrist cock up splint and educate family   Jash Wahlen, OT 11/25/2024, 8:49 AM           "

## 2024-11-25 NOTE — Therapy (Signed)
 " OUTPATIENT PHYSICAL THERAPY NEURO TREATMENT    Patient Name: Nicholas Holt MRN: [id number] DOB:[birth year], 44 y.o., male Today's Date: 11/25/2024   PCP: Ronnald Sergeant, MD REFERRING PROVIDER: Toribio Pitch, PA-C  END OF SESSION:  PT End of Session - 11/25/24 1616     Visit Number 5    Number of Visits 14    Date for Recertification  01/12/25    PT Start Time 1530    PT Stop Time 1614    PT Time Calculation (min) 44 min    Activity Tolerance Patient tolerated treatment well    Behavior During Therapy [facility] for tasks assessed/performed              Past Medical History:  Diagnosis Date   Anxiety    Attention deficit disorder 08/08/2006   Bilateral leg cramps 11/25/2006   Blood transfusion without reported diagnosis 1997   Chemotherapy associated   Brain cancer (HCC) 1998   Cough 12/08/2015   COVID-19 10/16/2017   Elevated hemidiaphragm 07/09/2015   Left, presumably after portacath placement.    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  Complicated by left sided motor tic disorder and panhypopituitarism.    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  Includes central diabetes insipidus, secondary hypothyroidism, secondary adrenal insufficiency, and secondary hypogonadism.    Perennial allergic rhinitis 07/09/2015   Pre-diabetes    no meds   Preventative health care 07/09/2015   Seizure disorder (HCC) 08/08/2006   Predated brain  tumor.  Generalized seizure ocurred after running out of lorazepam  post-tumor.    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 Treated with alendronate  from 2009-2016. DEXA (03/16/2008): L Spine T -2.0. L fem neck T -0.9, R fem neck T -1.9.  DEXA (10/15/2014): L Spine T -1.3, L fem neck T -0.6, R fem neck T -1.8.  Bisphosphonate holiday 06/2015.  Repeat DEXA scan in 10/2016 to reassess.    Weakness 09/15/2023   Past Surgical History:  Procedure Laterality Date   BRAIN BIOPSY  11/14/1995   CSF SHUNT  11/14/1995   PORTACATH PLACEMENT     portacath removal     RADIOLOGY WITH ANESTHESIA N/A 08/30/2021   Procedure: MRI WITH ANESTHESIA BRAIN WITH AND WITHOUT CONTRAST;  Surgeon: Radiologist, Medication, MD;  Location: [facility] OR;  Service: Radiology;  Laterality: N/A;   RADIOLOGY WITH ANESTHESIA N/A 05/25/2022   Procedure: MRI WITH ANESTHESIA OF BRAIN WITH AND WITHOUT CONTRAST;  Surgeon: Radiologist, Medication, MD;  Location: [facility] OR;  Service: Radiology;  Laterality: N/A;   RADIOLOGY WITH ANESTHESIA N/A 10/05/2024   Procedure: MRI WITH ANESTHESIA;  Surgeon: Radiologist, Medication, MD;  Location: [facility] OR;  Service: Radiology;  Laterality: N/A;   Patient Active Problem List   Diagnosis Date Noted   Left basal ganglia embolic stroke (HCC) 10/10/2024   Small vessel stroke (HCC) 10/10/2024   Gait abnormality 10/03/2024   Diarrhea 07/08/2024   Functional urinary incontinence 07/08/2024   Class 1 obesity with serious comorbidity and body  mass index (BMI) of 30.0 to 30.9 in adult 04/23/2023   Prediabetes 10/10/2022   Elevated CK 10/10/2022   OSA (obstructive sleep apnea) 07/06/2022   Multiple lacunar infarcts (HCC) 05/04/2022   Hypertension 12/09/2019   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   Elevated hemidiaphragm 07/09/2015   Perennial allergic rhinitis 07/09/2015   H/O malignant neoplasm of brain 09/22/2014   Steroid-induced osteoporosis 03/27/2008   Bilateral  leg cramps 11/25/2006   Panhypopituitarism of anterior and posterior pituitary 08/08/2006   Hyperlipidemia 08/08/2006   Attention deficit disorder 08/08/2006   Seizure disorder (HCC) 08/08/2006    ONSET DATE: 10/03/24  REFERRING DIAG: I63.9 (ICD-10-CM) - Left basal ganglia embolic stroke (HCC)  THERAPY DIAG:  Muscle weakness (generalized)  Attention and concentration deficit  Stiffness of left elbow, not elsewhere classified  Cognitive communication deficit  Other lack of coordination  Other symptoms and signs involving the nervous system  Visuospatial deficit  Stiffness of left wrist, not elsewhere classified  Dysarthria and anarthria  Other abnormalities of gait and mobility  Left basal ganglia embolic stroke (HCC)  Rationale for Evaluation and Treatment: Rehabilitation  SUBJECTIVE:                                                                                                                                                                                             SUBJECTIVE STATEMENT:  Pt stated he is doing good today; reports no pain.     EVAL: Pt presents with his mother for PT eval. States that on 10/03/24 she noted decreased balance, pt attempting to use the wall for balance and was decreased from prior functional baseline. Pt denies pain in the last week, not likely to report per mother, but she does not believe he has been having any pain. Inquires about [name] shoe, already ordered, pt has slide plate on L toe of the shoe to assist with mitigating fall risk. Pt ind with cog assist prior to CVA, requires CGA for walking currently. Trialed hemiwalker, not indicated with prior PT.  Pt accompanied by: family member  PERTINENT HISTORY:   From Acute Care Eval: 10/06/24 Pt admitted with above diagnosis. At baseline, pt ambulatory without AD and enjoys going to the gym and bowling.  He does live with mother and father who assist with iadls and other task due to  some cognitive and mobility deficits from prior CVA but still is fairly independent.  Pt with L hand contracture and L ankle contracture wearing AFO from prior CVA after brain tumor.  Today, pt requiring mod-max A for transfers due to posterior lean.  He did ambulate 68' with  PT but with mod A and unsafe gait pattern requiring frequent cues and stopping to regain balance and get close to RW, AFO in place.  Pt's strength difficult to assess completely due to unable to balance while sitting EOB, does appear to be slightly weaker on L compared to R - however, that may be baseline.  From CIR Eval: 10/11/24 Lynwood BROCKS. Weinkauf is a 44 year old male with history of suprasellar germinoma s/p chemo and XRT '97 with residual Left hemiplegia cognitive deficits, panhypopituitarism, anxiety d/o, ADD who was admitted on 10/03/24 with 4 day history of difficulty walking and worsening of dysarthria. Family reported recent change of DDAVP  from intranasal to oral route and was advised to present to ED by endocrinology due to concerns of sodium level which was 129 at admission. CT head negative for acute changes and showed right burr hole from prior ventric with anterior right frontal encephalomalacia and chronic encephalomalacia left basal ganglia with finding c/w Fahr disease. Neurology consulted and MRI brain done revealing 8 mm acute infarct in posterior limb left internal capsule. CTA brain was negative for LVO. Dr. Jerri felt that stroke likely related to small vessel radiation related vasculopathy and recommended DAPT X 3 week followed by Plavix  alone.  PT note 10/30/24 Pt performs sit to stand and ambulates x50' to toilet with CGA and no AD with cues to increase Lt stride length to improve reciprocal gait pattern and balance. Following, pt ambulates x50' back to [facility] with same cues. Pt completes multiple additional bouts of ambulation to work on endurance, balance, and coordination, completing x100', x150', and x75' with  seated rest breaks. Pt also completes 4 6 steps with Rt handrail and cues for safe step sequencing and positioning, with CGA provided overall.    PAIN:  Are you having pain? No 0/10 today, consistent with non-verbal communication  PRECAUTIONS: Fall and Other: Left sided weakness and hemiplegia   RED FLAGS: None   WEIGHT BEARING RESTRICTIONS: No  FALLS: Has patient fallen in last 6 months? No  LIVING ENVIRONMENT: Lives with: lives with their family Lives in: House/apartment Stairs: Yes: Internal: flight steps; on right going up and External: 3 steps; none Has following equipment at home: Single point cane  PLOF: Independent  PATIENT GOALS: return to PLOF, helping with groceries, bowling  OBJECTIVE:  Note: Objective measures were completed at Evaluation unless otherwise noted.  DIAGNOSTIC FINDINGS:   IMPRESSION: 10/05/24 1. 8 mm acute infarction in the posterior limb of the internal capsule on the left. 2. Chronic postsurgical changes in the suprasellar region and hypothalamus. No evidence of recurrent neoplasm. 3. Chronic small-vessel ischemic changes elsewhere throughout the brain as outlined above. 4. 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  COGNITION: Overall cognitive status: History of cognitive impairments - at baseline   SENSATION: WFL  COORDINATION: Decreased with inc velocity of movements bilaterally, L>R, finger/nose, finger/finger, stepping pattern in sitting (R, L, R, L) able to improve with decreased cadence  EDEMA:  Slight increase in edema of the L ankle, retromalleolar  MUSCLE TONE: LLE: left flexion contracture of L wrist, slight inc from baseline. Mild hypertonicity in LLE with quick movements, WNL if moves slowly    DTRs:  Biceps 1 = Trace , Brachioradialis 1 = Trace , Triceps 1 = Trace , Patella 1 = Trace , and Achilles 1 = Trace   POSTURE: rounded shoulders, forward head, weight shift right, and weight  shift left  LOWER EXTREMITY ROM:  Active  Right Eval Left Eval  Hip flexion WNL 114  Hip extension    Hip abduction 43 40  Hip adduction WNL WNL  Hip internal rotation    Hip external rotation    Knee flexion 125 118  Knee extension -4 -10  Ankle dorsiflexion 19 12  Ankle plantarflexion 30 26  Ankle inversion    Ankle eversion     (Blank rows = not tested)  LOWER EXTREMITY MMT:    MMT Right Eval Left Eval  Hip flexion 4/5 3+/5  Hip extension    Hip abduction 4+/5 3/5  Hip adduction 4+/5 4+/5  Hip internal rotation    Hip external rotation    Knee flexion 4/5 3-/5  Knee extension 4/5 3/5  Ankle dorsiflexion 3+/5 2/5  Ankle plantarflexion 3+/5 3/5  Ankle inversion    Ankle eversion    (Blank rows = not tested)   TRANSFERS: Sit to stand: SBA  Assistive device utilized: None      GAIT: Findings: Distance walked: 177ft, Assistive device utilized:Walker - 2 wheeled and None, Level of assistance: CGA, and Comments: frequent cues for pacing as pt is impulsive and attempted to complete increased cadence, with fatigue he is unable to maintain LLE demands from velocity, safety awareness deficits place him at inc risk of falls. Pt cued for step height and clearance during LLE swing phase, unable to maintain without cues and unable to maintain with inc fatigue. Cued for pacing at this point with improved safety taking smaller steps.                                                                                                                               TREATMENT DATE:  11/25/24 NuStep L5 x 6 min  Toe Taps 4 railing use  Step Ups 4 railing use 1x10  Modified sit ups 2x10  With 1.5# on BLE: Step to target on floor to increase step length  Step over half foam roller SAQ 2x10 Seated half foam roll lateral step overs  Ambulation 150' SBA to [facility]    11/24/24  Nustep L5x8 minutes seat 15, all four extremities   Forward and lateral steps over half foam rolls x4  laps down and back each Alternating forward steps to target with 1.5# each foot to improve step length x20 total   Gait training 244ftx2 1.5# each LE, cues to slow down and improve step length/stance time, then 271ft with 0# with same cues  Forward/backward walking in bars x4 laps cues for large steps      11/21/24  Forward step ups 6 inch step L, mod cues for sequencing/safety, U UE support STS with 7# wt alternating between L hand and B hands on dumb-bell x10  Nustep L5x8 minutes all four extremities seat 15 Gait training 111ft with 1.5# each LE, cues to slow down and for longer step length L   Standing on blue foam pad EC/close min guard  3x30 seconds Side steps on blue foam x2 laps down and back UEs on bar (tried to back down to 1 but unable)       11/18/24   Gait assessment and training in Cadence shoes by themselves (had more of a shuffle gait pattern with poor toe clearance and anterior translation of wt/short fast steps), Cadence shoes + AFO (turned into more of a steppage gait pattern with poor motor control L LE), regular shoes + AFO (had best foot clearance although gait pattern certainly was not perfect)- additionally Cadence shoe toe box was a bit too long and too narrow for his forefoot, making risk of tripping and skin breakdown fairly high. Recommended they use regular shoes and AFO for now.  Nustep L5x8 minutes all four extremities  seat 15 Wide tandem  blue foam pad 2x30 seconds B  Forward step downs 6 inch step very close min guard x10 B    11/03/24- EVAL    PATIENT EDUCATION: Education details: Pt educated on relevant anatomy, physiology, pathology, diagnosis, prognosis, progression of care, pain and activity modification related to s/p CVA 10/03/24 Person educated: Patient and Parent Education method: Explanation, Demonstration, and Handouts Education comprehension: verbalized understanding, returned demonstration, verbal cues required, and needs further  education  HOME EXERCISE PROGRAM: Access Code: [email or web address] URL: [email or web address] Date: 11/03/2024 Prepared by: Stann Ohara  Exercises - Seated Hip Flexion March with Ankle Weights  - 2 x daily - 7 x weekly - 3 sets - 15 reps - 2 hold - Seated Long Arc Quad  - 2 x daily - 7 x weekly - 3 sets - 15 reps - 2 hold - Seated Heel Raise  - 2 x daily - 7 x weekly - 3 sets - 15 reps - 2 hold  GOALS: Goals reviewed with patient? Yes  SHORT TERM GOALS: Target date: 12/11/24   Pt will report compliance with HEP to work towards ind and home management strategies Baseline: Goal status: IN PROGRESS 11/25/24   2.  Pt will amb x328ft with LRAD at supervision assist or less to demonstrate improved activity tolerance and safety with home mobility Baseline:  Goal status: IN PROGRESS 11/25/24   3.  Pt will improve L LE AROM to full and painless in order to demonstrate progress towards activity tolerance and improved function Baseline:  Goal status: INITIAL  4.  Pt will demonstrate stair negotiation using non reciprocal pattern x1 flight with right handrail per home setup with CGA and demonstrate safety recall requiring caregiver presence to assist with steps Baseline: 2 steps into the house, mod A  Goal status: modA for 1 step up and down 11/25/24    LONG TERM GOALS: Target date: 01/12/25   Pt will amb x737ft with LRAD at supervision assist or less to demonstrate improved activity tolerance and safety with home mobility Baseline:  Goal status: IN PROGRESS SBA to CGA 11/25/24   2.  Pt will demonstrate 4/5 strength or greater in LLE to demonstrate improved strength and stability Baseline:  Goal status: INITIAL   3.  Pt will be ind in the management of their symptoms at home and in the community Baseline:  Goal status: IN PROGRESS requires assist 11/25/24   4.  Pt will complete unilateral carry of 5 pounds or greater a distance of 153ft to mimic assisting with groceries  Baseline:   Goal status: IN PROGRESS 11/25/24     ASSESSMENT:  CLINICAL IMPRESSION:  Continued to focus on pre-gait activities with a focus on  improving step length bilaterally. Pt tends to walk at an increased pace without fully clearing LLE which increases fall risk. Pt with diminished heel strike on initial contact tnds to lead with his toes on his L LE. Pt will continue to benefit from skilled PT in order to improve gait quality and  general strengthening.    EVAL: Patient is a 44 y.o. M who was seen today for physical therapy evaluation and treatment for s/p CVA 10/03/24 with L sided deficits. Pt has baseline deficits in the L hemisphere due to brain tumor at age 83. Prior to this event, he was able to participate in grocery shopping and home activities, amb without AD. Pt currently demonstrates inc impulsivity with safety awareness concerns requiring frequent cues. Pt and family educated on maintained safety. Pt stands to benefit from continued skilled physical therapy to address deficit areas and restore safety with activities and participations at home and in the community.     OBJECTIVE IMPAIRMENTS: Abnormal gait, decreased activity tolerance, decreased balance, decreased cognition, decreased coordination, decreased endurance, decreased mobility, difficulty walking, decreased ROM, decreased strength, decreased safety awareness, increased edema, impaired tone, impaired UE functional use, impaired vision/preception, and postural dysfunction.   ACTIVITY LIMITATIONS: carrying, lifting, bending, standing, squatting, stairs, bathing, dressing, self feeding, and locomotion level  PARTICIPATION LIMITATIONS: shopping and community activity  PERSONAL FACTORS: Behavior pattern, Fitness, Past/current experiences, and Time since onset of injury/illness/exacerbation are also affecting patient's functional outcome.   REHAB POTENTIAL: Good  CLINICAL DECISION MAKING: Evolving/moderate complexity  EVALUATION  COMPLEXITY: Moderate  PLAN:  PT FREQUENCY: 1-2x/week  PT DURATION: 10 weeks  PLANNED INTERVENTIONS: 97110-Therapeutic exercises, 97530- Therapeutic activity, [id number]- Neuromuscular re-education, 97535- Self Care, [id number]- Manual therapy, [id number]- Gait training, [phone]- Orthotic Initial, [phone]- Orthotic/Prosthetic subsequent, [id number]- Electrical stimulation (unattended), Patient/Family education, and Wheelchair mobility training  PLAN FOR NEXT SESSION: progress motor control, gait and balance, strength, stability, mobility, endurance through functional movement patterns. Work on increasing L step length/L LE motor control for gait mechanics, try to reduce steppage pattern L   Thersia Alder, Student-PT 11/25/2024 4:20 PM     "

## 2024-11-25 NOTE — Therapy (Signed)
 " OUTPATIENT OCCUPATIONAL THERAPY NEURO  treatment  Patient Name: Nicholas Holt MRN: [id number] DOB:Nov 17, 1980, 44 y.o., male Today's Date: 11/25/2024  PCP: Karna Fellows MD REFERRING PROVIDER: Dr. Carilyn  END OF SESSION:  OT End of Session - 11/25/24 1545     Number of Visits 25    Date for Recertification  01/26/25    Authorization Type [employer] until January then [facility]    Behavior During Therapy Impulsive;WFL for tasks assessed/performed;Restless             Past Medical History:  Diagnosis Date   Anxiety    Attention deficit disorder 08/08/2006   Bilateral leg cramps 11/25/2006   Blood transfusion without reported diagnosis 1997   Chemotherapy associated   Brain cancer (HCC) 1998   Cough 12/08/2015   COVID-19 10/16/2017   Elevated hemidiaphragm 07/09/2015   Left, presumably after portacath placement.    Fall 04/13/2022   H/O malignant neoplasm of brain 09/22/2014   Suprasellar germinoma treated at [facility] in 1997; received chemotherapy (cisplatin, etoposide, vincristine, and cyclophosphomide) and radiation therapy.  Complicated by left sided motor tic disorder and panhypopituitarism.    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  Includes central diabetes insipidus, secondary hypothyroidism, secondary adrenal insufficiency, and secondary hypogonadism.    Perennial allergic rhinitis 07/09/2015   Pre-diabetes    no meds   Preventative health care 07/09/2015   Seizure disorder (HCC) 08/08/2006   Predated brain tumor.  Generalized seizure ocurred after running out of lorazepam  post-tumor.     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 Treated with alendronate  from 2009-2016. DEXA (03/16/2008): L Spine T -2.0. L fem neck T -0.9, R fem neck T -1.9.  DEXA (10/15/2014): L Spine T -1.3, L fem neck T -0.6, R fem neck T -1.8.  Bisphosphonate holiday 06/2015.  Repeat DEXA scan in 10/2016 to reassess.    Weakness 09/15/2023   Past Surgical History:  Procedure Laterality Date   BRAIN BIOPSY  11/14/1995   CSF SHUNT  11/14/1995   PORTACATH PLACEMENT     portacath removal     RADIOLOGY WITH ANESTHESIA N/A 08/30/2021   Procedure: MRI WITH ANESTHESIA BRAIN WITH AND WITHOUT CONTRAST;  Surgeon: Radiologist, Medication, MD;  Location: [facility] OR;  Service: Radiology;  Laterality: N/A;   RADIOLOGY WITH ANESTHESIA N/A 05/25/2022   Procedure: MRI WITH ANESTHESIA OF BRAIN WITH AND WITHOUT CONTRAST;  Surgeon: Radiologist, Medication, MD;  Location: [facility] OR;  Service: Radiology;  Laterality: N/A;   RADIOLOGY WITH ANESTHESIA N/A 10/05/2024   Procedure: MRI WITH ANESTHESIA;  Surgeon: Radiologist, Medication, MD;  Location: [facility] OR;  Service: Radiology;  Laterality: N/A;   Patient Active Problem List   Diagnosis Date Noted   Left basal ganglia embolic stroke (HCC) 10/10/2024   Small vessel stroke (HCC) 10/10/2024   Gait abnormality 10/03/2024   Diarrhea 07/08/2024   Functional urinary incontinence 07/08/2024   Class 1 obesity with serious comorbidity and body mass index (BMI) of 30.0 to 30.9 in adult 04/23/2023   Prediabetes 10/10/2022   Elevated CK 10/10/2022   OSA (obstructive sleep apnea) 07/06/2022   Multiple lacunar infarcts (  HCC) 05/04/2022   Hypertension 12/09/2019   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   Elevated hemidiaphragm 07/09/2015   Perennial allergic rhinitis 07/09/2015   H/O malignant neoplasm of brain 09/22/2014   Steroid-induced osteoporosis 03/27/2008   Bilateral leg cramps 11/25/2006   Panhypopituitarism of anterior and posterior pituitary  08/08/2006   Hyperlipidemia 08/08/2006   Attention deficit disorder 08/08/2006   Seizure disorder (HCC) 08/08/2006    ONSET DATE: 10/31/24- referral date  REFERRING DIAG:  I63.9 (ICD-10-CM) - Left basal ganglia embolic stroke (HCC)    THERAPY DIAG:  Muscle weakness (generalized)  Other lack of coordination  Visuospatial deficit  Attention and concentration deficit  Other symptoms and signs involving the nervous system  Stiffness of left wrist, not elsewhere classified  Stiffness of left elbow, not elsewhere classified  Rationale for Evaluation and Treatment: Rehabilitation  SUBJECTIVE:   SUBJECTIVE STATEMENT: Pt denies pain Pt accompanied by: family member  PERTINENT HISTORY: 44 yo M hospitalized 10/04/24 with impaired balance with posterior limb internal capsule infarct. PMH: suprasellar germinoma s/p chemo with left-sided motor disorder, HTN, HLD anxiety, ADD, ataxia, seizure disorder, OSA Pt d/c from CIR  Pt had LUE weakness from braintumor however pt's mom reports weakness is worse since CVA PRECAUTIONS:seizures, fall risk    PAIN:  Are you having pain? no  FALLS: Has patient fallen in last 6 months? No  LIVING ENVIRONMENT: Lives with: lives with their family Lives in: House/apartment Stairs: yes flight  Has following equipment at home: Shower bench  PLOF: Independent with household mobility with device and Needs assistance with ADLs  PATIENT GOALS: improve independence  OBJECTIVE:  Note: Objective measures were completed at Evaluation unless otherwise noted.  HAND DOMINANCE: Right  ADLs:Pt required some assist with ADLs prior to CVA Overall ADLs: Pt/ mom are currrently sleeping downstairs as she is concerned he will wake up and go downstairs on his own Transfers/ambulation related to ADLs: Eating: mod I  Grooming: supervision UB Dressing: dependent LB Dressing: dependent Toileting: min A Bathing: dependent Tub Shower transfers: has not tried  has tub transfer bench Equipment: Transfer tub bench  IADLs:Dependent, PTA pt brought in groceries and vacuumed  Medication management: requires assistance Financial management: dependent    MOBILITY STATUS: minguard/ min a without device  Sitting balance: posterior lean   UPPER EXTREMITY ROM:  LUE weakness  Active ROM Right eval Left eval  Shoulder flexion 115 75  Shoulder abduction    Shoulder adduction    Shoulder extension    Shoulder internal rotation    Shoulder external rotation    Elbow flexion  130  Elbow extension  -40  Wrist flexion  45 at rest, to 65 flexion  Wrist extension  -45,( -20 P/ROM)  Wrist ulnar deviation    Wrist radial deviation    Wrist pronation    Wrist supination    (Blank rows = not tested)    HAND FUNCTION: Grip strength: Right: 32 lbs; Left: 15 lbs  COORDINATION: Box and Blocks:  Right 34blocks, Left 33blocks  SENSATION: Not tested   MUSCLE TONE: LUE: Moderate and Hypertonic, history of spasticity from brain tumor, recent botox while hospitalized   COGNITION: Overall cognitive status: History of cognitive impairments - at baseline    VISION ASSESSMENT: To be further assessed in functional context, pt walks close to wall on right side      OBSERVATIONS: Impulsive gentleman accompanied by his mother  TREATMENT DATE:  11/25/24-Therapist completed fabrication and fitting of wrist cock up splint and issued to patient. Pt's mother returned demonstration of application, Pt demonstrates ability to remove if issues.  11/24/24- UBE x 5 mins level 1 for conditioning, mod v.c for perfromance speed. Pt performed closed chain shoulder flexion with bilateral UE's reaching for floor. Passive stretch to left wrist in extension. Therapist initated fabrication of a wrist cock up splint. Splint was not completed due  to time constraints.    11/18/24- Pt practiced simulated tub bench transfers with min v.c and assistance. Pt/s mom then dad observed and plan to have pt perform at home with assistance. Therapist recommends use of an adjustable shower head too. Cane exercises for shoulder flexion and chest press, mod v.c Using RUE to remove and replace wooden dowel pegs into pegboard with LUE, min v.c Simulated eating task, min v.c for speed UBE x 4 mins level 1 for conditioning.   11/10/24- Pt/ mom report pt donned pants with supervision today. Pt/ mom were instructed in red putty HEP for composite grip and tip pinch, mod v.c and demonstration for bilateral UE's Cane exercises for shoulder flexion, chest press and abduction to right side, mod v.c and facilitation. Placing and removing graded clothespins from container for sustained pinch 1-8# with right and left UE's mod v.c Pt has a old today and he wore a masks for treatment.   11/03/24- eval         PATIENT EDUCATION: Education details splint wear, care and precautions. Person educated: Patient and Parents Education method: Explanation, demonstration, v.c, handout Education comprehension: verbalized understanding, returned demonstration, v.c  HOME EXERCISE PROGRAM:  11/10/24- red putty, cane ex   GOALS: Goals reviewed with patient? Yes  SHORT TERM GOALS: Target date: 12/04/24  I with HEP Baseline: Goal status:  ongoing 11/18/24  2.  Pt will consistently donn shirt  with set up Baseline:  Goal status:met  11/18/24  3.  Pt/ caregiver will be I with positioning to minimize risk for LUE contracture  Goal status:  11/25/2624 ongoing wrist cock up splint issued  4.  Pt will donn pants with min A Baseline:  Goal status: ongoing, pt's mom reports supervison today, will monitor for consistency 11/10/24  5.  Pt will increase bilateral UE functional use as evidenced by increasing box/ blocks score by 3 blocks bilaterally Baseline:  Goal  status: INITIAL  6.  Further assess vision and set goal prn. Baseline:  Goal status: INITIAL  LONG TERM GOALS: Target date: 01/26/25  I with updated HEP Baseline:  Goal status: INITIAL  2.  Pt will increase RUE grip strength to 37 # or greater for increased functional use. Baseline:  Goal status: INITIAL  3.  Pt will increase RUE shoulder flexion to 120 for increased functional reach. Baseline:  Goal status: INITIAL  4.  Pt will increase L shoulder flexion to 85* for increased ease with dressing. Baseline:  Goal status: INITIAL  5.  Pt will perform dressing with supervision/ setup Baseline:  Goal status: INITIAL  6.  Pt will perfrom bathing with min A Baseline:  Goal status: INITIAL  ASSESSMENT:  CLINICAL IMPRESSION: Pt is progressing towards goals. Pt was issued wrist cock up splint for positioning and pt/ mother were instucted in wear care and precautions. PERFORMANCE DEFICITS: in functional skills including ADLs, IADLs, coordination, dexterity, sensation, ROM, strength, pain, flexibility, Fine motor control, Gross motor control, mobility, balance, endurance, decreased knowledge of precautions, decreased knowledge of use of DME,  and UE functional use, cognitive skills including attention, learn, problem solving, safety awareness, thought, and understand, and psychosocial skills including coping strategies, environmental adaptation, habits, interpersonal interactions, and routines and behaviors.   IMPAIRMENTS: are limiting patient from ADLs, IADLs, rest and sleep, play, leisure, and social participation.   CO-MORBIDITIES: may have co-morbidities  that affects occupational performance. Patient will benefit from skilled OT to address above impairments and improve overall function.  MODIFICATION OR ASSISTANCE TO COMPLETE EVALUATION: Min-Moderate modification of tasks or assist with assess necessary to complete an evaluation.  OT OCCUPATIONAL PROFILE AND HISTORY: Detailed  assessment: Review of records and additional review of physical, cognitive, psychosocial history related to current functional performance.  CLINICAL DECISION MAKING: Moderate - several treatment options, min-mod task modification necessary  REHAB POTENTIAL: Good  EVALUATION COMPLEXITY: Moderate    PLAN:  OT FREQUENCY: 2x/week  OT DURATION: 12 weeks  PLANNED INTERVENTIONS: 97168 OT Re-evaluation, 97535 self care/ADL training, 02889 therapeutic exercise, 97530 therapeutic activity, 97112 neuromuscular re-education, 97140 manual therapy, 97035 ultrasound, 97018 paraffin, 02989 moist heat, 97010 cryotherapy, 97129 Cognitive training (first 15 min), 02869 Cognitive training(each additional 15 min), 02249 Physical Performance Testing, 02239 Orthotic Initial, 97763 Orthotic/Prosthetic subsequent, passive range of motion, balance training, functional mobility training, visual/perceptual remediation/compensation, energy conservation, coping strategies training, patient/family education, and DME and/or AE instructions  RECOMMENDED OTHER SERVICES: n/a  CONSULTED AND AGREED WITH PLAN OF CARE: Patient and family member/caregiver  PLAN FOR NEXT SESSION:  complete wrist cock up splint and educate family   Makynlee Kressin, OT 11/25/2024, 3:45 PM           "

## 2024-11-25 NOTE — Telephone Encounter (Signed)
 Called pt's mother to let her know Plavix  was refilled on 11/17/24. Stated she will call [facility].

## 2024-11-26 ENCOUNTER — Ambulatory Visit: Admitting: Physical Therapy

## 2024-11-26 ENCOUNTER — Other Ambulatory Visit: Payer: Self-pay | Admitting: Internal Medicine

## 2024-11-26 DIAGNOSIS — R252 Cramp and spasm: Secondary | ICD-10-CM

## 2024-11-27 ENCOUNTER — Other Ambulatory Visit: Payer: Self-pay

## 2024-11-27 DIAGNOSIS — G40909 Epilepsy, unspecified, not intractable, without status epilepticus: Secondary | ICD-10-CM

## 2024-11-27 DIAGNOSIS — F4322 Adjustment disorder with anxiety: Secondary | ICD-10-CM

## 2024-11-27 NOTE — Telephone Encounter (Signed)
 Patient last seen 07/08/24 I called the patient to schedule a appointment. I was unable to reach the patient. I lvm for him to give us  a call back.

## 2024-11-28 ENCOUNTER — Ambulatory Visit

## 2024-11-28 DIAGNOSIS — R471 Dysarthria and anarthria: Secondary | ICD-10-CM

## 2024-11-28 DIAGNOSIS — R41841 Cognitive communication deficit: Secondary | ICD-10-CM

## 2024-11-28 NOTE — Therapy (Signed)
 " OUTPATIENT SPEECH LANGUAGE PATHOLOGY TREATMENT   Patient Name: Nicholas Holt MRN: [id number] DOB:1981/04/17, 44 y.o., male Today's Date: 11/28/2024  PCP: Karna Fellows, MD REFERRING PROVIDER: Pegge Toribio PARAS, PA-C  END OF SESSION:  End of Session - 11/28/24 1051     Visit Number 4    Number of Visits 17    Date for Recertification  01/04/25    SLP Start Time 1006    SLP Stop Time  1050    SLP Time Calculation (min) 44 min    Activity Tolerance Patient tolerated treatment well           Past Medical History:  Diagnosis Date   Anxiety    Attention deficit disorder 08/08/2006   Bilateral leg cramps 11/25/2006   Blood transfusion without reported diagnosis 1997   Chemotherapy associated   Brain cancer (HCC) 1998   Cough 12/08/2015   COVID-19 10/16/2017   Elevated hemidiaphragm 07/09/2015   Left, presumably after portacath placement.    Fall 04/13/2022   H/O malignant neoplasm of brain 09/22/2014   Suprasellar germinoma treated at [facility] in 1997; received chemotherapy (cisplatin, etoposide, vincristine, and cyclophosphomide) and radiation therapy.  Complicated by left sided motor tic disorder and panhypopituitarism.    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  Includes central diabetes insipidus, secondary hypothyroidism, secondary adrenal insufficiency, and secondary hypogonadism.    Perennial allergic rhinitis 07/09/2015   Pre-diabetes    no meds   Preventative health care 07/09/2015   Seizure disorder (HCC) 08/08/2006   Predated brain tumor.  Generalized seizure ocurred after running out of  lorazepam  post-tumor.    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 Treated with alendronate  from 2009-2016. DEXA (03/16/2008): L Spine T -2.0. L fem neck T -0.9, R fem neck T -1.9.  DEXA (10/15/2014): L Spine T -1.3, L fem neck T -0.6, R fem neck T -1.8.  Bisphosphonate holiday 06/2015.  Repeat DEXA scan in 10/2016 to reassess.    Weakness 09/15/2023   Past Surgical History:  Procedure Laterality Date   BRAIN BIOPSY  11/14/1995   CSF SHUNT  11/14/1995   PORTACATH PLACEMENT     portacath removal     RADIOLOGY WITH ANESTHESIA N/A 08/30/2021   Procedure: MRI WITH ANESTHESIA BRAIN WITH AND WITHOUT CONTRAST;  Surgeon: Radiologist, Medication, MD;  Location: [facility] OR;  Service: Radiology;  Laterality: N/A;   RADIOLOGY WITH ANESTHESIA N/A 05/25/2022   Procedure: MRI WITH ANESTHESIA OF BRAIN WITH AND WITHOUT CONTRAST;  Surgeon: Radiologist, Medication, MD;  Location: [facility] OR;  Service: Radiology;  Laterality: N/A;   RADIOLOGY WITH ANESTHESIA N/A 10/05/2024   Procedure: MRI WITH ANESTHESIA;  Surgeon: Radiologist, Medication, MD;  Location: [facility] OR;  Service: Radiology;  Laterality: N/A;   Patient Active Problem List   Diagnosis Date Noted   Left basal ganglia embolic stroke (HCC) 10/10/2024   Small vessel stroke (HCC) 10/10/2024   Gait abnormality 10/03/2024   Diarrhea 07/08/2024   Functional urinary incontinence 07/08/2024   Class 1 obesity with serious comorbidity and body mass index (BMI) of 30.0 to 30.9 in adult 04/23/2023   Prediabetes 10/10/2022  Elevated CK 10/10/2022   OSA (obstructive sleep apnea) 07/06/2022   Multiple lacunar infarcts (HCC) 05/04/2022   Hypertension 12/09/2019   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   Elevated hemidiaphragm 07/09/2015   Perennial allergic rhinitis 07/09/2015   H/O malignant neoplasm of brain 09/22/2014   Steroid-induced osteoporosis 03/27/2008   Bilateral leg cramps 11/25/2006   Panhypopituitarism of anterior  and posterior pituitary 08/08/2006   Hyperlipidemia 08/08/2006   Attention deficit disorder 08/08/2006   Seizure disorder (HCC) 08/08/2006    ONSET DATE: Referred on 10/31/24   REFERRING DIAG: I63.9 (ICD-10-CM) - Left basal ganglia embolic stroke (HCC)   THERAPY DIAG:  Dysarthria and anarthria  Cognitive communication deficit  Rationale for Evaluation and Treatment: Rehabilitation  SUBJECTIVE:   SUBJECTIVE STATEMENT: We may just finish up with Lupita and then discharge until you return.  Pt accompanied by: self  PERTINENT HISTORY: brain CA 1997, CVA  PAIN:  Are you having pain? No  FALLS: Has patient fallen in last 6 months?  No    PATIENT GOALS: speech  OBJECTIVE:  Note: Objective measures were completed at Evaluation unless otherwise noted.  DIAGNOSTIC FINDINGS: :Per EMR  MR BRAIN W WO CONTRAST  Date: 10/04/2024   IMPRESSION: 1. 8 mm acute infarction in the posterior limb of the internal capsule on the left. 2. Chronic postsurgical changes in the suprasellar region and hypothalamus. No evidence of recurrent neoplasm. 3. Chronic small-vessel ischemic changes elsewhere throughout the brain as outlined above. 4. 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  COGNITION: Overall cognitive status: Impaired: Areas of impairment:  Attention: Impaired: Selective, Alternating, Divided Memory: Impaired: Short term Prospective Auditory Awareness: Impaired: Emergent Executive function: Impaired: Impulse control, Problem solving, Organization, Planning, Error awareness, Self-correction, and Slow processing  and History of cognitive impairments - at baseline Functional deficits: See clinical impression  COGNITIVE COMMUNICATION: Following directions: Follows multi-step commands inconsistently  Auditory comprehension: WFL Verbal expression: WFL Functional communication: Impaired: recall of information in conversations   ORAL MOTOR  EXAMINATION: Overall status: Impaired;  Comments: Facial ROM: Reduced left;Suspected CN VII (facial) dysfunction Facial Symmetry: Abnormal symmetry left;Suspected CN VII (facial) dysfunction Lingual ROM: Other (Comment) (imprecise) Lingual Symmetry: Within Functional Limits   MOTOR SPEECH: Overall motor speech: impaired Level of impairment: Phrase, Sentence, and Conversation Respiration: thoracic breathing Phonation: normal Resonance: WFL Articulation: Impaired: phrase, sentence, and conversation Intelligibility: Intelligibility reduced ~ 25% to unfamiliar listener Motor planning: Appears intact  Effective technique: slow rate, increased vocal intensity, pause, and pacing   STANDARDIZED ASSESSMENTS:   Cognitive Linguistic Quick Test: AGE - 18 - 69   The Cognitive Linguistic Quick Test (CLQT) was administered to assess the relative status of five cognitive domains: attention, memory, language, executive functioning, and visuospatial skills. Scores from 10 tasks were used to estimate severity ratings (standardized for age groups 18-69 years and 70-89 years) for each domain, a clock drawing task, as well as an overall composite severity rating of cognition.       Task Score Criterion Cut Scores  Personal Facts 8/8 8  Symbol Cancellation 9/12 11  Confrontation Naming 10/10 10  Clock Drawing  6/13 12  Story Retelling 4/10 6  Symbol Trails 1/10 9  Generative Naming 3/9 5  Design Memory 2/6 5  Mazes  0/8 7  Design Generation 3/13 6    Cognitive Domain Composite Score Severity Rating  Attention 99/215 Moderate  Memory 103/185 Severe  Executive Function 9/40 Severe  Language 25/37 Mild  Visuospatial Skills  35/105 Severe  Clock Drawing  6/13 Severe  Composite Severity Rating  Moderate    PATIENT REPORTED OUTCOME MEASURES (PROM):   Neuro- QOL - Cognitive Function PROM   -Jahmil: 61 - Mom: 37                                                                                                                             TREATMENT DATE:   11/28/24: SLP brought pt back to ST room and he cont'd to ask SLP how SLP was today. SLP introduced pt to speech strategies and worked on pt repeating demographic info with SLOW and LOUD. Pt req'd consistent mod cues and benefited most from simultaneous production.  With 2-4 syllable word level responses pt demonstrated intelligible speech using loud and clear cue consistently. SLP used negative practice technique but this did not appear to improve pt response. In sentence responses (4-7 syllables) pt req'd consistent min-mod cues to start loud and finish loud. SLP provided pt homework to practice this approx 15 min/day until next session.   11/22/23: Pt was seen for skilled ST services targeting intelligibility strategies. Mother reports they have not had time to practice at home, but she has been reminding him to slow down. SLP facilitated session by reading functional phrases. SLP trialed pacing by tapping leg/table, as well as, increasing loudness. Overarticulation is too difficult at this time. Intelligibility increased significantly with increased loudness and slowed rate. This did not transfer over into speech today. Continued practice on strategies at reading (phrase) level. SLP provided pt with HEP (phrases list).   *Pt mother reported they do not want to seek services at Neuro at this time. To follow up with PRN SLP for 3 visits and decide next steps.   11/10/24: Completed CLQT - see above for scores. Reviewed with mom and patient. No questions at this time. Mother reports her main focus is communication and is wanting to focus speech intelligibility at this time. SLP encouraged mom to think about memory strategies as well as pt is completely dependent on parents. Mom open to working on memory. OT to assist with facilitating cognition within ADLs. SLP provided pt with speech intelligibility strategies re: SLOP today. To focus on  over-articulation.   **Neuro is likely closest. Pt would likely seek ST at different location - but would cont OT/PT here.**    PATIENT EDUCATION: Education details: cognitive-communication Person educated: Patient and Parent Education method: Explanation Education comprehension: verbalized understanding   GOALS: Goals reviewed with patient? Yes  SHORT TERM GOALS: Target date: 12/05/24  Complete PROM, testing, and update goals Baseline: Goal status: MET  2.  Pt will verbalize 2 strategies to improve speech intelligibility.  Baseline:  Goal status: PROGRESSING  3.  Pt will recall the following Mneumonic  STPD (STOP THINK PLAN DO).  Baseline:  Goal status: INITIAL    LONG TERM GOALS: Target date: 01/05/25  Pt will improve score on PROM Baseline:  Goal status: INITIAL  2.  Pt/mom will report use of external memory strategies  Baseline:  Goal status: INITIAL  3.  Pt will demonstrate use of 1 speech intelligibility strategy in structured conversation with minA verbal cues.  Baseline:  Goal status: INITIAL   ASSESSMENT:  CLINICAL IMPRESSION: Pt is a 44 yo male who presented to ST OP for evaluation post CVA. SEE TX NOTE.   @ eval: Pt was accompanied by mother for today's evaluation. Pt with baseline deficits following brain CA in 1997. Mom reports changes in thinking skills post CVA re: impulsivity (specifically with eating/movement), short term memory, and focus, as well as, and speech.  Pt was assessed using CLQT and informal speech measures. Pt speech observed to be around 25% intelligible to an unfamiliar listener and c/b re: rapid rate, imprecise articulation. SLP attempted to pace patient with asking close-ended questions, which appeared to be helpful. Pt required cueing to wait during assessment, as pt would try to take pen from therapist hand while she was explaining/demonstrating directions.  He also benefited from repetition throughout evaluation. SLP rec skilled  ST services to address dysarthria and cognitive-communication impairment.    OBJECTIVE IMPAIRMENTS: include attention, memory, awareness, executive functioning, and dysarthria. These impairments are limiting patient from managing medications, managing appointments, managing finances, household responsibilities, ADLs/IADLs, and effectively communicating at home and in community. Factors affecting potential to achieve goals and functional outcome are ability to learn/carryover information 2/2 to memory impairment. Patient will benefit from skilled SLP services to address above impairments and improve overall function.  REHAB POTENTIAL: Fair 2/2 to memory impairment; however, great caregiver support.   PLAN:  SLP FREQUENCY: 2x/week  SLP DURATION: 8 weeks  PLANNED INTERVENTIONS: Environmental controls, Cueing hierachy, SLP instruction and feedback, Compensatory strategies, Patient/family education, and [id number] Treatment of speech (30 or 45 min)     Dorethy Tomey, CCC-SLP 11/28/2024, 10:52 AM          "

## 2024-11-28 NOTE — Telephone Encounter (Signed)
 Patient last seen 07/08/24 I called the patient to schedule a appointment. I was unable to reach the patient. I lvm for him to give us  a call back.

## 2024-11-28 NOTE — Patient Instructions (Signed)
" ° °  Big thing to remember:  Start loud and STAY LOUD!  Talk LOUD and CLEAR!! "

## 2024-12-01 ENCOUNTER — Ambulatory Visit

## 2024-12-01 ENCOUNTER — Ambulatory Visit: Admitting: Occupational Therapy

## 2024-12-01 DIAGNOSIS — R41841 Cognitive communication deficit: Secondary | ICD-10-CM

## 2024-12-01 DIAGNOSIS — R278 Other lack of coordination: Secondary | ICD-10-CM

## 2024-12-01 DIAGNOSIS — M25622 Stiffness of left elbow, not elsewhere classified: Secondary | ICD-10-CM

## 2024-12-01 DIAGNOSIS — R29818 Other symptoms and signs involving the nervous system: Secondary | ICD-10-CM

## 2024-12-01 DIAGNOSIS — R41842 Visuospatial deficit: Secondary | ICD-10-CM

## 2024-12-01 DIAGNOSIS — I639 Cerebral infarction, unspecified: Secondary | ICD-10-CM

## 2024-12-01 DIAGNOSIS — R252 Cramp and spasm: Secondary | ICD-10-CM

## 2024-12-01 DIAGNOSIS — M25632 Stiffness of left wrist, not elsewhere classified: Secondary | ICD-10-CM

## 2024-12-01 DIAGNOSIS — R41840 Attention and concentration deficit: Secondary | ICD-10-CM

## 2024-12-01 DIAGNOSIS — R2689 Other abnormalities of gait and mobility: Secondary | ICD-10-CM

## 2024-12-01 DIAGNOSIS — M6281 Muscle weakness (generalized): Secondary | ICD-10-CM

## 2024-12-01 DIAGNOSIS — R41844 Frontal lobe and executive function deficit: Secondary | ICD-10-CM

## 2024-12-01 NOTE — Therapy (Signed)
 " OUTPATIENT OCCUPATIONAL THERAPY NEURO  treatment  Patient Name: STEPHANIE MCGLONE MRN: [id number] DOB:Apr 09, 1981, 44 y.o., male Today's Date: 12/01/2024  PCP: Karna Fellows MD REFERRING PROVIDER: Dr. Carilyn  END OF SESSION:  OT End of Session - 12/01/24 1405     Visit Number 5    Number of Visits 25    Date for Recertification  01/26/25    Authorization Type [employer] until January then [facility]    Authorization - Visit Number 5    OT Start Time 1401    OT Stop Time 1442    OT Time Calculation (min) 41 min    Activity Tolerance Patient tolerated treatment well    Behavior During Therapy [facility] for tasks assessed/performed              Past Medical History:  Diagnosis Date   Anxiety    Attention deficit disorder 08/08/2006   Bilateral leg cramps 11/25/2006   Blood transfusion without reported diagnosis 1997   Chemotherapy associated   Brain cancer (HCC) 1998   Cough 12/08/2015   COVID-19 10/16/2017   Elevated hemidiaphragm 07/09/2015   Left, presumably after portacath placement.    Fall 04/13/2022   H/O malignant neoplasm of brain 09/22/2014   Suprasellar germinoma treated at [facility] in 1997; received chemotherapy (cisplatin, etoposide, vincristine, and cyclophosphomide) and radiation therapy.  Complicated by left sided motor tic disorder and panhypopituitarism.    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  Includes central diabetes insipidus, secondary hypothyroidism, secondary adrenal insufficiency, and secondary hypogonadism.    Perennial allergic rhinitis 07/09/2015   Pre-diabetes    no meds    Preventative health care 07/09/2015   Seizure disorder (HCC) 08/08/2006   Predated brain tumor.  Generalized seizure ocurred after running out of lorazepam  post-tumor.    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 Treated with alendronate  from 2009-2016. DEXA (03/16/2008): L Spine T -2.0. L fem neck T -0.9, R fem neck T -1.9.  DEXA (10/15/2014): L Spine T -1.3, L fem neck T -0.6, R fem neck T -1.8.  Bisphosphonate holiday 06/2015.  Repeat DEXA scan in 10/2016 to reassess.    Weakness 09/15/2023   Past Surgical History:  Procedure Laterality Date   BRAIN BIOPSY  11/14/1995   CSF SHUNT  11/14/1995   PORTACATH PLACEMENT     portacath removal     RADIOLOGY WITH ANESTHESIA N/A 08/30/2021   Procedure: MRI WITH ANESTHESIA BRAIN WITH AND WITHOUT CONTRAST;  Surgeon: Radiologist, Medication, MD;  Location: [facility] OR;  Service: Radiology;  Laterality: N/A;   RADIOLOGY WITH ANESTHESIA N/A 05/25/2022   Procedure: MRI WITH ANESTHESIA OF BRAIN WITH AND WITHOUT CONTRAST;  Surgeon: Radiologist, Medication, MD;  Location: [facility] OR;  Service: Radiology;  Laterality: N/A;   RADIOLOGY WITH ANESTHESIA N/A 10/05/2024   Procedure: MRI WITH ANESTHESIA;  Surgeon: Radiologist, Medication, MD;  Location: [facility] OR;  Service: Radiology;  Laterality: N/A;   Patient Active Problem List   Diagnosis Date Noted   Left basal ganglia embolic stroke (HCC) 10/10/2024   Small vessel stroke (HCC) 10/10/2024   Gait abnormality 10/03/2024   Diarrhea 07/08/2024  Functional urinary incontinence 07/08/2024   Class 1 obesity with serious comorbidity and body mass index (BMI) of 30.0 to 30.9 in adult 04/23/2023   Prediabetes 10/10/2022   Elevated CK 10/10/2022   OSA (obstructive sleep apnea) 07/06/2022   Multiple lacunar infarcts (HCC) 05/04/2022   Hypertension 12/09/2019   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   Elevated hemidiaphragm 07/09/2015   Perennial allergic rhinitis 07/09/2015   H/O  malignant neoplasm of brain 09/22/2014   Steroid-induced osteoporosis 03/27/2008   Bilateral leg cramps 11/25/2006   Panhypopituitarism of anterior and posterior pituitary 08/08/2006   Hyperlipidemia 08/08/2006   Attention deficit disorder 08/08/2006   Seizure disorder (HCC) 08/08/2006    ONSET DATE: 10/31/24- referral date  REFERRING DIAG:  I63.9 (ICD-10-CM) - Left basal ganglia embolic stroke (HCC)    THERAPY DIAG:  Other lack of coordination  Other symptoms and signs involving the nervous system  Other abnormalities of gait and mobility  Visuospatial deficit  Stiffness of left wrist, not elsewhere classified  Stiffness of left elbow, not elsewhere classified  Attention and concentration deficit  Muscle weakness (generalized)  Frontal lobe and executive function deficit  Rationale for Evaluation and Treatment: Rehabilitation  SUBJECTIVE:   SUBJECTIVE STATEMENT: Pt denies pain Pt accompanied by: family member  PERTINENT HISTORY: 44 yo M hospitalized 10/04/24 with impaired balance with posterior limb internal capsule infarct. PMH: suprasellar germinoma s/p chemo with left-sided motor disorder, HTN, HLD anxiety, ADD, ataxia, seizure disorder, OSA Pt d/c from CIR  Pt had LUE weakness from braintumor however pt's mom reports weakness is worse since CVA PRECAUTIONS:seizures, fall risk    PAIN:  Are you having pain? no  FALLS: Has patient fallen in last 6 months? No  LIVING ENVIRONMENT: Lives with: lives with their family Lives in: House/apartment Stairs: yes flight  Has following equipment at home: Shower bench  PLOF: Independent with household mobility with device and Needs assistance with ADLs  PATIENT GOALS: improve independence  OBJECTIVE:  Note: Objective measures were completed at Evaluation unless otherwise noted.  HAND DOMINANCE: Right  ADLs:Pt required some assist with ADLs prior to CVA Overall ADLs: Pt/ mom are currrently sleeping downstairs  as she is concerned he will wake up and go downstairs on his own Transfers/ambulation related to ADLs: Eating: mod I  Grooming: supervision UB Dressing: dependent LB Dressing: dependent Toileting: min A Bathing: dependent Tub Shower transfers: has not tried has tub transfer bench Equipment: Transfer tub bench  IADLs:Dependent, PTA pt brought in groceries and vacuumed  Medication management: requires assistance Financial management: dependent    MOBILITY STATUS: minguard/ min a without device  Sitting balance: posterior lean   UPPER EXTREMITY ROM:  LUE weakness  Active ROM Right eval Left eval  Shoulder flexion 115 75  Shoulder abduction    Shoulder adduction    Shoulder extension    Shoulder internal rotation    Shoulder external rotation    Elbow flexion  130  Elbow extension  -40  Wrist flexion  45 at rest, to 65 flexion  Wrist extension  -45,( -20 P/ROM)  Wrist ulnar deviation    Wrist radial deviation    Wrist pronation    Wrist supination    (Blank rows = not tested)    HAND FUNCTION: Grip strength: Right: 32 lbs; Left: 15 lbs  COORDINATION: Box and Blocks:  Right 34blocks, Left 33blocks  SENSATION: Not tested   MUSCLE TONE: LUE: Moderate and Hypertonic, history of spasticity from brain tumor, recent botox while hospitalized  COGNITION: Overall cognitive status: History of cognitive impairments - at baseline    VISION ASSESSMENT: To be further assessed in functional context, pt walks close to wall on right side      OBSERVATIONS: Impulsive gentleman accompanied by his mother                                                                                                                             TREATMENT DATE:  12/01/24 Pt practiced doffing and donning left shoe, sock and AFO brace, min v.c and demonstation with exception of assist for tying shoe tighter. Pt with good performance. Tabletop scanning, pt performed large print scanning  with 100% accuracy then 27M scanning that items to locate changed each line, 2 errors. Completing 12 piece puzzle with mod-max v.c for performance and strategy, pt demonstrates continued impulsivity at imes.  11/25/24-Therapist completed fabrication and fitting of wrist cock up splint and issued to patient. Pt's mother returned demonstration of application, Pt demonstrates ability to remove if issues.  11/24/24- UBE x 5 mins level 1 for conditioning, mod v.c for performance speed. Pt performed closed chain shoulder flexion with bilateral UE's reaching for floor. Passive stretch to left wrist in extension. Therapist initated fabrication of a wrist cock up splint. Splint was not completed due to time constraints.    11/18/24- Pt practiced simulated tub bench transfers with min v.c and assistance. Pt/s mom then dad observed and plan to have pt perform at home with assistance. Therapist recommends use of an adjustable shower head too. Cane exercises for shoulder flexion and chest press, mod v.c Using RUE to remove and replace wooden dowel pegs into pegboard with LUE, min v.c Simulated eating task, min v.c for speed UBE x 4 mins level 1 for conditioning.   11/10/24- Pt/ mom report pt donned pants with supervision today. Pt/ mom were instructed in red putty HEP for composite grip and tip pinch, mod v.c and demonstration for bilateral UE's Cane exercises for shoulder flexion, chest press and abduction to right side, mod v.c and facilitation. Placing and removing graded clothespins from container for sustained pinch 1-8# with right and left UE's mod v.c Pt has a old today and he wore a masks for treatment.   11/03/24- eval         PATIENT EDUCATION: Education details splint wear, care and precautions review, donning/ doffing shoe, sock and brace.. Person educated: Patient and Parents Education method: Explanation, demonstration, v.c,  Education comprehension: verbalized understanding,  returned demonstration, v.c  HOME EXERCISE PROGRAM:  11/10/24- red putty, cane ex   GOALS: Goals reviewed with patient? Yes  SHORT TERM GOALS: Target date: 12/04/24  I with HEP Baseline: Goal status:  ongoing 11/18/24  2.  Pt will consistently donn shirt  with set up Baseline:  Goal status:met  11/18/24  3.  Pt/ caregiver will be I with positioning to minimize risk for LUE contracture  Goal status:  12/01/2624 ongoing wrist cock up  splint issued  4.  Pt will donn pants with min A Baseline:  Goal status: met per mom's report 12/02/23  5.  Pt will increase bilateral UE functional use as evidenced by increasing box/ blocks score by 3 blocks bilaterally Baseline:  Goal status: ongoing 12/01/24  6.  Further assess vision and set goal prn. Baseline:  Goal status: ongoing assessing in functional context large print number cancellation 100% accuracy today. 12/01/24  LONG TERM GOALS: Target date: 01/26/25  I with updated HEP Baseline:  Goal status: INITIAL  2.  Pt will increase RUE grip strength to 37 # or greater for increased functional use. Baseline:  Goal status: INITIAL  3.  Pt will increase RUE shoulder flexion to 120 for increased functional reach. Baseline:  Goal status: INITIAL  4.  Pt will increase L shoulder flexion to 85* for increased ease with dressing. Baseline:  Goal status: INITIAL  5.  Pt will perform dressing with supervision/ setup Baseline:  Goal status: INITIAL  6.  Pt will perfrom bathing with min A Baseline:  Goal status: INITIAL  ASSESSMENT:  CLINICAL IMPRESSION: Pt is progressing towards goals. Pt's mom reports he hasn't worn splint consistently. Splint wear schedule discussed with pt/ mom. Pt demonstrates improving performance of donning sock, shoe and AFO with practice. PERFORMANCE DEFICITS: in functional skills including ADLs, IADLs, coordination, dexterity, sensation, ROM, strength, pain, flexibility, Fine motor control, Gross motor  control, mobility, balance, endurance, decreased knowledge of precautions, decreased knowledge of use of DME, and UE functional use, cognitive skills including attention, learn, problem solving, safety awareness, thought, and understand, and psychosocial skills including coping strategies, environmental adaptation, habits, interpersonal interactions, and routines and behaviors.   IMPAIRMENTS: are limiting patient from ADLs, IADLs, rest and sleep, play, leisure, and social participation.   CO-MORBIDITIES: may have co-morbidities  that affects occupational performance. Patient will benefit from skilled OT to address above impairments and improve overall function.  MODIFICATION OR ASSISTANCE TO COMPLETE EVALUATION: Min-Moderate modification of tasks or assist with assess necessary to complete an evaluation.  OT OCCUPATIONAL PROFILE AND HISTORY: Detailed assessment: Review of records and additional review of physical, cognitive, psychosocial history related to current functional performance.  CLINICAL DECISION MAKING: Moderate - several treatment options, min-mod task modification necessary  REHAB POTENTIAL: Good  EVALUATION COMPLEXITY: Moderate    PLAN:  OT FREQUENCY: 2x/week  OT DURATION: 12 weeks  PLANNED INTERVENTIONS: 97168 OT Re-evaluation, 97535 self care/ADL training, 02889 therapeutic exercise, 97530 therapeutic activity, 97112 neuromuscular re-education, 97140 manual therapy, 97035 ultrasound, 97018 paraffin, 02989 moist heat, 97010 cryotherapy, 97129 Cognitive training (first 15 min), 02869 Cognitive training(each additional 15 min), 02249 Physical Performance Testing, 02239 Orthotic Initial, 97763 Orthotic/Prosthetic subsequent, passive range of motion, balance training, functional mobility training, visual/perceptual remediation/compensation, energy conservation, coping strategies training, patient/family education, and DME and/or AE instructions  RECOMMENDED OTHER SERVICES:  n/a  CONSULTED AND AGREED WITH PLAN OF CARE: Patient and family member/caregiver  PLAN FOR NEXT SESSION:  check on how dressing is going at home, HEP reveiw.   Sadeel Fiddler, OT 12/01/2024, 2:06 PM           "

## 2024-12-01 NOTE — Therapy (Signed)
 " OUTPATIENT PHYSICAL THERAPY NEURO TREATMENT    Patient Name: Nicholas Holt MRN: [id number] DOB:1981-05-24, 44 y.o., male Today's Date: 12/01/2024  PCP: Ronnald Sergeant, MD REFERRING PROVIDER: Toribio Pitch, PA-C  END OF SESSION:  PT End of Session - 12/01/24 1257     Visit Number 6    Number of Visits 14    Date for Recertification  01/12/25    PT Start Time 1310    PT Stop Time 1350    PT Time Calculation (min) 40 min    Activity Tolerance Patient tolerated treatment well    Behavior During Therapy [facility] for tasks assessed/performed         Past Medical History:  Diagnosis Date   Anxiety    Attention deficit disorder 08/08/2006   Bilateral leg cramps 11/25/2006   Blood transfusion without reported diagnosis 1997   Chemotherapy associated   Brain cancer (HCC) 1998   Cough 12/08/2015   COVID-19 10/16/2017   Elevated hemidiaphragm 07/09/2015   Left, presumably after portacath placement.    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  Complicated by left sided motor tic disorder and panhypopituitarism.    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  Includes central diabetes insipidus, secondary hypothyroidism, secondary adrenal insufficiency, and secondary hypogonadism.    Perennial allergic rhinitis 07/09/2015   Pre-diabetes    no meds   Preventative health care 07/09/2015   Seizure disorder (HCC) 08/08/2006   Predated brain tumor.   Generalized seizure ocurred after running out of lorazepam  post-tumor.    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 Treated with alendronate  from 2009-2016. DEXA (03/16/2008): L Spine T -2.0. L fem neck T -0.9, R fem neck T -1.9.  DEXA (10/15/2014): L Spine T -1.3, L fem neck T -0.6, R fem neck T -1.8.  Bisphosphonate holiday 06/2015.  Repeat DEXA scan in 10/2016 to reassess.    Weakness 09/15/2023   Past Surgical History:  Procedure Laterality Date   BRAIN BIOPSY  11/14/1995   CSF SHUNT  11/14/1995   PORTACATH PLACEMENT     portacath removal     RADIOLOGY WITH ANESTHESIA N/A 08/30/2021   Procedure: MRI WITH ANESTHESIA BRAIN WITH AND WITHOUT CONTRAST;  Surgeon: Radiologist, Medication, MD;  Location: [facility] OR;  Service: Radiology;  Laterality: N/A;   RADIOLOGY WITH ANESTHESIA N/A 05/25/2022   Procedure: MRI WITH ANESTHESIA OF BRAIN WITH AND WITHOUT CONTRAST;  Surgeon: Radiologist, Medication, MD;  Location: [facility] OR;  Service: Radiology;  Laterality: N/A;   RADIOLOGY WITH ANESTHESIA N/A 10/05/2024   Procedure: MRI WITH ANESTHESIA;  Surgeon: Radiologist, Medication, MD;  Location: [facility] OR;  Service: Radiology;  Laterality: N/A;   Patient Active Problem List   Diagnosis Date Noted   Left basal ganglia embolic stroke (HCC) 10/10/2024   Small vessel stroke (HCC) 10/10/2024   Gait abnormality 10/03/2024   Diarrhea 07/08/2024   Functional urinary incontinence 07/08/2024   Class 1 obesity with serious comorbidity and body mass index (BMI) of 30.0 to  30.9 in adult 04/23/2023   Prediabetes 10/10/2022   Elevated CK 10/10/2022   OSA (obstructive sleep apnea) 07/06/2022   Multiple lacunar infarcts (HCC) 05/04/2022   Hypertension 12/09/2019   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   Elevated hemidiaphragm 07/09/2015   Perennial allergic rhinitis 07/09/2015   H/O malignant neoplasm of brain 09/22/2014   Steroid-induced osteoporosis 03/27/2008   Bilateral leg  cramps 11/25/2006   Panhypopituitarism of anterior and posterior pituitary 08/08/2006   Hyperlipidemia 08/08/2006   Attention deficit disorder 08/08/2006   Seizure disorder (HCC) 08/08/2006    ONSET DATE: 10/03/24  REFERRING DIAG: I63.9 (ICD-10-CM) - Left basal ganglia embolic stroke (HCC)  THERAPY DIAG:  Other lack of coordination  Other symptoms and signs involving the nervous system  Other abnormalities of gait and mobility  Visuospatial deficit  Stiffness of left wrist, not elsewhere classified  Left basal ganglia embolic stroke (HCC)  Stiffness of left elbow, not elsewhere classified  Attention and concentration deficit  Muscle weakness (generalized)  Bilateral leg cramps  Frontal lobe and executive function deficit  Cognitive communication deficit  Rationale for Evaluation and Treatment: Rehabilitation  SUBJECTIVE:                                                                                                                                                                                             SUBJECTIVE STATEMENT:  Pt reported no pain today upon arrival of PT. Pt arrived with spouse today and she stated that they wanted him to work on going down the stairs; reports he goes up the stairs well but needs practice going down.     EVAL: Pt presents with his mother for PT eval. States that on 10/03/24 she noted decreased balance, pt attempting to use the wall for balance and was decreased from prior functional baseline. Pt denies pain in the last week, not likely to report per mother, but she does not believe he has been having any pain. Inquires about [name] shoe, already ordered, pt has slide plate on L toe of the shoe to assist with mitigating fall risk. Pt ind with cog assist prior to CVA, requires CGA for walking currently. Trialed hemiwalker, not indicated with prior PT.  Pt accompanied by: family member  PERTINENT HISTORY:   From Acute Care Eval:  10/06/24 Pt admitted with above diagnosis. At baseline, pt ambulatory without AD and enjoys going to the gym and bowling.  He does live with mother and father who assist with iadls and other task due to some cognitive and mobility deficits from prior CVA but still is fairly independent.  Pt with L hand  contracture and L ankle contracture wearing AFO from prior CVA after brain tumor.  Today, pt requiring mod-max A for transfers due to posterior lean.  He did ambulate 54' with PT but with mod A and unsafe gait pattern requiring frequent cues and stopping to regain balance and get close to RW, AFO in place.  Pt's strength difficult to assess completely due to unable to balance while sitting EOB, does appear to be slightly weaker on L compared to R - however, that may be baseline.  From CIR Eval: 10/11/24 Lynwood BROCKS. O[place]inn is a 44 year old male with history of suprasellar germinoma s/p chemo and XRT '97 with residual Left hemiplegia cognitive deficits, panhypopituitarism, anxiety d/o, ADD who was admitted on 10/03/24 with 4 day history of difficulty walking and worsening of dysarthria. Family reported recent change of DDAVP  from intranasal to oral route and was advised to present to ED by endocrinology due to concerns of sodium level which was 129 at admission. CT head negative for acute changes and showed right burr hole from prior ventric with anterior right frontal encephalomalacia and chronic encephalomalacia left basal ganglia with finding c/w Fahr disease. Neurology consulted and MRI brain done revealing 8 mm acute infarct in posterior limb left internal capsule. CTA brain was negative for LVO. Dr. Jerri felt that stroke likely related to small vessel radiation related vasculopathy and recommended DAPT X 3 week followed by Plavix  alone.  PT note 10/30/24 Pt performs sit to stand and ambulates x50' to toilet with CGA and no AD with cues to increase Lt stride length to improve reciprocal gait pattern and  balance. Following, pt ambulates x50' back to [facility] with same cues. Pt completes multiple additional bouts of ambulation to work on endurance, balance, and coordination, completing x100', x150', and x75' with seated rest breaks. Pt also completes 4 6 steps with Rt handrail and cues for safe step sequencing and positioning, with CGA provided overall.    PAIN:  Are you having pain? No 0/10 today, consistent with non-verbal communication  PRECAUTIONS: Fall and Other: Left sided weakness and hemiplegia   RED FLAGS: None   WEIGHT BEARING RESTRICTIONS: No  FALLS: Has patient fallen in last 6 months? No  LIVING ENVIRONMENT: Lives with: lives with their family Lives in: House/apartment Stairs: Yes: Internal: flight steps; on right going up and External: 3 steps; none Has following equipment at home: Single point cane  PLOF: Independent  PATIENT GOALS: return to PLOF, helping with groceries, bowling  OBJECTIVE:  Note: Objective measures were completed at Evaluation unless otherwise noted.  DIAGNOSTIC FINDINGS:   IMPRESSION: 10/05/24 1. 8 mm acute infarction in the posterior limb of the internal capsule on the left. 2. Chronic postsurgical changes in the suprasellar region and hypothalamus. No evidence of recurrent neoplasm. 3. Chronic small-vessel ischemic changes elsewhere throughout the brain as outlined above. 4. 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  COGNITION: Overall cognitive status: History of cognitive impairments - at baseline   SENSATION: WFL  COORDINATION: Decreased with inc velocity of movements bilaterally, L>R, finger/nose, finger/finger, stepping pattern in sitting (R, L, R, L) able to improve with decreased cadence  EDEMA:  Slight increase in edema of the L ankle, retromalleolar  MUSCLE TONE: LLE: left flexion contracture of L wrist, slight inc from baseline. Mild hypertonicity in LLE with quick movements, WNL if moves  slowly    DTRs:  Biceps 1 = Trace , Brachioradialis 1 = Trace , Triceps 1 = Trace ,  Patella 1 = Trace , and Achilles 1 = Trace   POSTURE: rounded shoulders, forward head, weight shift right, and weight shift left  LOWER EXTREMITY ROM:     Active  Right Eval Left Eval  Hip flexion WNL 114  Hip extension    Hip abduction 43 40  Hip adduction WNL WNL  Hip internal rotation    Hip external rotation    Knee flexion 125 118  Knee extension -4 -10  Ankle dorsiflexion 19 12  Ankle plantarflexion 30 26  Ankle inversion    Ankle eversion     (Blank rows = not tested)  LOWER EXTREMITY MMT:    MMT Right Eval Left Eval  Hip flexion 4/5 3+/5  Hip extension    Hip abduction 4+/5 3/5  Hip adduction 4+/5 4+/5  Hip internal rotation    Hip external rotation    Knee flexion 4/5 3-/5  Knee extension 4/5 3/5  Ankle dorsiflexion 3+/5 2/5  Ankle plantarflexion 3+/5 3/5  Ankle inversion    Ankle eversion    (Blank rows = not tested)   TRANSFERS: Sit to stand: SBA  Assistive device utilized: None      GAIT: Findings: Distance walked: 15ft, Assistive device utilized:Walker - 2 wheeled and None, Level of assistance: CGA, and Comments: frequent cues for pacing as pt is impulsive and attempted to complete increased cadence, with fatigue he is unable to maintain LLE demands from velocity, safety awareness deficits place him at inc risk of falls. Pt cued for step height and clearance during LLE swing phase, unable to maintain without cues and unable to maintain with inc fatigue. Cued for pacing at this point with improved safety taking smaller steps.                                                                                                                               TREATMENT DATE:   12/01/24 NuStep L5 x 6 min  Lateral Step Overs on Airex  Lateral Step Overs + Number Sequencing  [place] taps forward and backward  Standing Marches on Airex  [place] Reaching on Airex  Stair  Training Railing Use  STS 2x10 STS LLE elevated 2 1x10     11/25/24 NuStep L5 x 6 min  Toe Taps 4 railing use  Step Ups 4 railing use 1x10  Modified sit ups 2x10  With 1.5# on BLE: Step to target on floor to increase step length  Step over half foam roller SAQ 2x10 Seated half foam roll lateral step overs  Ambulation 150' SBA to [facility]    11/24/24  Nustep L5x8 minutes seat 15, all four extremities   Forward and lateral steps over half foam rolls x4 laps down and back each Alternating forward steps to target with 1.5# each foot to improve step length x20 total   Gait training 249ftx2 1.5# each LE, cues to slow down and improve step length/stance time, then 252ft with  0# with same cues  Forward/backward walking in bars x4 laps cues for large steps      11/21/24  Forward step ups 6 inch step L, mod cues for sequencing/safety, U UE support STS with 7# wt alternating between L hand and B hands on dumb-bell x10  Nustep L5x8 minutes all four extremities seat 15 Gait training 130ft with 1.5# each LE, cues to slow down and for longer step length L   Standing on blue foam pad EC/close min guard 3x30 seconds Side steps on blue foam x2 laps down and back UEs on bar (tried to back down to 1 but unable)       11/18/24   Gait assessment and training in Cadence shoes by themselves (had more of a shuffle gait pattern with poor toe clearance and anterior translation of wt/short fast steps), Cadence shoes + AFO (turned into more of a steppage gait pattern with poor motor control L LE), regular shoes + AFO (had best foot clearance although gait pattern certainly was not perfect)- additionally Cadence shoe toe box was a bit too long and too narrow for his forefoot, making risk of tripping and skin breakdown fairly high. Recommended they use regular shoes and AFO for now.  Nustep L5x8 minutes all four extremities  seat 15 Wide tandem  blue foam pad 2x30 seconds B  Forward step downs 6  inch step very close min guard x10 B    11/03/24- EVAL    PATIENT EDUCATION: Education details: Pt educated on relevant anatomy, physiology, pathology, diagnosis, prognosis, progression of care, pain and activity modification related to s/p CVA 10/03/24 Person educated: Patient and Parent Education method: Explanation, Demonstration, and Handouts Education comprehension: verbalized understanding, returned demonstration, verbal cues required, and needs further education  HOME EXERCISE PROGRAM: Access Code: [email or web address] URL: [email or web address] Date: 11/03/2024 Prepared by: Stann Ohara  Exercises - Seated Hip Flexion March with Ankle Weights  - 2 x daily - 7 x weekly - 3 sets - 15 reps - 2 hold - Seated Long Arc Quad  - 2 x daily - 7 x weekly - 3 sets - 15 reps - 2 hold - Seated Heel Raise  - 2 x daily - 7 x weekly - 3 sets - 15 reps - 2 hold  GOALS: Goals reviewed with patient? Yes  SHORT TERM GOALS: Target date: 12/11/24   Pt will report compliance with HEP to work towards ind and home management strategies Baseline: Goal status: IN PROGRESS 11/25/24   2.  Pt will amb x34ft with LRAD at supervision assist or less to demonstrate improved activity tolerance and safety with home mobility Baseline:  Goal status: IN PROGRESS 11/25/24   3.  Pt will improve L LE AROM to full and painless in order to demonstrate progress towards activity tolerance and improved function Baseline:  Goal status: IN PROGRESS deficits remain in L LE 1/11/21/24  4.  Pt will demonstrate stair negotiation using non reciprocal pattern x1 flight with right handrail per home setup with CGA and demonstrate safety recall requiring caregiver presence to assist with steps Baseline: 2 steps into the house, mod A  Goal status: modA for 1 step up and down 11/25/24    LONG TERM GOALS: Target date: 01/12/25   Pt will amb x744ft with LRAD at supervision assist or less to demonstrate improved activity  tolerance and safety with home mobility Baseline:  Goal status: IN PROGRESS SBA to CGA 11/25/24   2.  Pt will demonstrate 4/5 strength or  greater in LLE to demonstrate improved strength and stability Baseline:  Goal status: IN PROGRESS knee ext of L LE improved to 3+/5 all others remain the same 12/01/24   3.  Pt will be ind in the management of their symptoms at home and in the community Baseline:  Goal status: IN PROGRESS requires assist 11/25/24   4.  Pt will complete unilateral carry of 5 pounds or greater a distance of 134ft to mimic assisting with groceries  Baseline:  Goal status: IN PROGRESS 11/25/24     ASSESSMENT:  CLINICAL IMPRESSION:  Pt tolerated treatment well today. Pt has been doing well with current regimen; pt requires cues for pacing during activity. Today's session continued to focus on pre-gait activities with a focus on improving step length bilaterally. Session also focused on stair navigation pt instructed to utilize railings, especially at home. Pt instructed to take it one step at a time and focus on L LE knee flexion during steps. Pt tends to walk at an increased pace without fully clearing LLE which increases fall risk. Pt with diminished heel strike on initial contact tends to lead with his toes on his L LE. Pt will continue to benefit from skilled PT in order to improve gait quality and  general strengthening.    EVAL: Patient is a 44 y.o. M who was seen today for physical therapy evaluation and treatment for s/p CVA 10/03/24 with L sided deficits. Pt has baseline deficits in the L hemisphere due to brain tumor at age 29. Prior to this event, he was able to participate in grocery shopping and home activities, amb without AD. Pt currently demonstrates inc impulsivity with safety awareness concerns requiring frequent cues. Pt and family educated on maintained safety. Pt stands to benefit from continued skilled physical therapy to address deficit areas and restore  safety with activities and participations at home and in the community.     OBJECTIVE IMPAIRMENTS: Abnormal gait, decreased activity tolerance, decreased balance, decreased cognition, decreased coordination, decreased endurance, decreased mobility, difficulty walking, decreased ROM, decreased strength, decreased safety awareness, increased edema, impaired tone, impaired UE functional use, impaired vision/preception, and postural dysfunction.   ACTIVITY LIMITATIONS: carrying, lifting, bending, standing, squatting, stairs, bathing, dressing, self feeding, and locomotion level  PARTICIPATION LIMITATIONS: shopping and community activity  PERSONAL FACTORS: Behavior pattern, Fitness, Past/current experiences, and Time since onset of injury/illness/exacerbation are also affecting patient's functional outcome.   REHAB POTENTIAL: Good  CLINICAL DECISION MAKING: Evolving/moderate complexity  EVALUATION COMPLEXITY: Moderate  PLAN:  PT FREQUENCY: 1-2x/week  PT DURATION: 10 weeks  PLANNED INTERVENTIONS: 97110-Therapeutic exercises, 97530- Therapeutic activity, [id number]- Neuromuscular re-education, 97535- Self Care, [id number]- Manual therapy, [id number]- Gait training, [phone]- Orthotic Initial, [phone]- Orthotic/Prosthetic subsequent, [id number]- Electrical stimulation (unattended), Patient/Family education, and Wheelchair mobility training  PLAN FOR NEXT SESSION: progress motor control, gait and balance, strength, stability, mobility, endurance through functional movement patterns. Work on increasing L step length/L LE motor control for gait mechanics, try to reduce steppage pattern L   Thersia Alder, Student-PT 12/01/24 1:02 PM     "

## 2024-12-02 NOTE — Therapy (Unsigned)
 " OUTPATIENT OCCUPATIONAL THERAPY NEURO  treatment  Patient Name: JESIAH GRISMER MRN: [id number] DOB:12-13-1980, 44 y.o., male Today's Date: 12/03/2024  PCP: Karna Fellows MD REFERRING PROVIDER: Dr. Carilyn  END OF SESSION:  OT End of Session - 12/03/24 1631     Visit Number 6    Number of Visits 25    Date for Recertification  01/26/25    Authorization Type [employer] until January then [facility]    OT Start Time 1533    OT Stop Time 1613    OT Time Calculation (min) 40 min    Activity Tolerance Patient tolerated treatment well    Behavior During Therapy [facility] for tasks assessed/performed               Past Medical History:  Diagnosis Date   Anxiety    Attention deficit disorder 08/08/2006   Bilateral leg cramps 11/25/2006   Blood transfusion without reported diagnosis 1997   Chemotherapy associated   Brain cancer (HCC) 1998   Cough 12/08/2015   COVID-19 10/16/2017   Elevated hemidiaphragm 07/09/2015   Left, presumably after portacath placement.    Fall 04/13/2022   H/O malignant neoplasm of brain 09/22/2014   Suprasellar germinoma treated at [facility] in 1997; received chemotherapy (cisplatin, etoposide, vincristine, and cyclophosphomide) and radiation therapy.  Complicated by left sided motor tic disorder and panhypopituitarism.    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  Includes central diabetes insipidus, secondary hypothyroidism, secondary adrenal insufficiency, and secondary hypogonadism.    Perennial allergic rhinitis 07/09/2015   Pre-diabetes    no meds   Preventative health care 07/09/2015    Seizure disorder (HCC) 08/08/2006   Predated brain tumor.  Generalized seizure ocurred after running out of lorazepam  post-tumor.    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 Treated with alendronate  from 2009-2016. DEXA (03/16/2008): L Spine T -2.0. L fem neck T -0.9, R fem neck T -1.9.  DEXA (10/15/2014): L Spine T -1.3, L fem neck T -0.6, R fem neck T -1.8.  Bisphosphonate holiday 06/2015.  Repeat DEXA scan in 10/2016 to reassess.    Weakness 09/15/2023   Past Surgical History:  Procedure Laterality Date   BRAIN BIOPSY  11/14/1995   CSF SHUNT  11/14/1995   PORTACATH PLACEMENT     portacath removal     RADIOLOGY WITH ANESTHESIA N/A 08/30/2021   Procedure: MRI WITH ANESTHESIA BRAIN WITH AND WITHOUT CONTRAST;  Surgeon: Radiologist, Medication, MD;  Location: [facility] OR;  Service: Radiology;  Laterality: N/A;   RADIOLOGY WITH ANESTHESIA N/A 05/25/2022   Procedure: MRI WITH ANESTHESIA OF BRAIN WITH AND WITHOUT CONTRAST;  Surgeon: Radiologist, Medication, MD;  Location: [facility] OR;  Service: Radiology;  Laterality: N/A;   RADIOLOGY WITH ANESTHESIA N/A 10/05/2024   Procedure: MRI WITH ANESTHESIA;  Surgeon: Radiologist, Medication, MD;  Location: [facility] OR;  Service: Radiology;  Laterality: N/A;   Patient Active Problem List   Diagnosis Date Noted   Left basal ganglia embolic stroke (HCC) 10/10/2024   Small vessel stroke (HCC) 10/10/2024   Gait abnormality 10/03/2024   Diarrhea 07/08/2024   Functional urinary incontinence 07/08/2024  Class 1 obesity with serious comorbidity and body mass index (BMI) of 30.0 to 30.9 in adult 04/23/2023   Prediabetes 10/10/2022   Elevated CK 10/10/2022   OSA (obstructive sleep apnea) 07/06/2022   Multiple lacunar infarcts (HCC) 05/04/2022   Hypertension 12/09/2019   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   Elevated hemidiaphragm 07/09/2015   Perennial allergic rhinitis 07/09/2015   H/O malignant neoplasm of brain 09/22/2014    Steroid-induced osteoporosis 03/27/2008   Bilateral leg cramps 11/25/2006   Panhypopituitarism of anterior and posterior pituitary 08/08/2006   Hyperlipidemia 08/08/2006   Attention deficit disorder 08/08/2006   Seizure disorder (HCC) 08/08/2006    ONSET DATE: 10/31/24- referral date  REFERRING DIAG:  I63.9 (ICD-10-CM) - Left basal ganglia embolic stroke (HCC)    THERAPY DIAG:  Other lack of coordination  Other symptoms and signs involving the nervous system  Other abnormalities of gait and mobility  Visuospatial deficit  Stiffness of left wrist, not elsewhere classified  Stiffness of left elbow, not elsewhere classified  Attention and concentration deficit  Muscle weakness (generalized)  Frontal lobe and executive function deficit  Rationale for Evaluation and Treatment: Rehabilitation  SUBJECTIVE:   SUBJECTIVE STATEMENT: Pt denies pain Pt accompanied by: family member  PERTINENT HISTORY: 44 yo M hospitalized 10/04/24 with impaired balance with posterior limb internal capsule infarct. PMH: suprasellar germinoma s/p chemo with left-sided motor disorder, HTN, HLD anxiety, ADD, ataxia, seizure disorder, OSA Pt d/c from CIR  Pt had LUE weakness from braintumor however pt's mom reports weakness is worse since CVA PRECAUTIONS:seizures, fall risk    PAIN:  Are you having pain? no  FALLS: Has patient fallen in last 6 months? No  LIVING ENVIRONMENT: Lives with: lives with their family Lives in: House/apartment Stairs: yes flight  Has following equipment at home: Shower bench  PLOF: Independent with household mobility with device and Needs assistance with ADLs  PATIENT GOALS: improve independence  OBJECTIVE:  Note: Objective measures were completed at Evaluation unless otherwise noted.  HAND DOMINANCE: Right  ADLs:Pt required some assist with ADLs prior to CVA Overall ADLs: Pt/ mom are currrently sleeping downstairs as she is concerned he will wake up  and go downstairs on his own Transfers/ambulation related to ADLs: Eating: mod I  Grooming: supervision UB Dressing: dependent LB Dressing: dependent Toileting: min A Bathing: dependent Tub Shower transfers: has not tried has tub transfer bench Equipment: Transfer tub bench  IADLs:Dependent, PTA pt brought in groceries and vacuumed  Medication management: requires assistance Financial management: dependent    MOBILITY STATUS: minguard/ min a without device  Sitting balance: posterior lean   UPPER EXTREMITY ROM:  LUE weakness  Active ROM Right eval Left eval  Shoulder flexion 115 75  Shoulder abduction    Shoulder adduction    Shoulder extension    Shoulder internal rotation    Shoulder external rotation    Elbow flexion  130  Elbow extension  -40  Wrist flexion  45 at rest, to 65 flexion  Wrist extension  -45,( -20 P/ROM)  Wrist ulnar deviation    Wrist radial deviation    Wrist pronation    Wrist supination    (Blank rows = not tested)    HAND FUNCTION: Grip strength: Right: 32 lbs; Left: 15 lbs  COORDINATION: Box and Blocks:  Right 34blocks, Left 33blocks  SENSATION: Not tested   MUSCLE TONE: LUE: Moderate and Hypertonic, history of spasticity from brain tumor, recent botox while hospitalized   COGNITION: Overall cognitive status:  History of cognitive impairments - at baseline    VISION ASSESSMENT: To be further assessed in functional context, pt walks close to wall on right side      OBSERVATIONS: Impulsive gentleman accompanied by his mother                                                                                                                             TREATMENT DATE:  12/03/24 UBE x 5 mins level 1 for conditioning Seated on mat functional reaching to elbveated surface to place/ remove cylindrical objects, mod difficulty for balance, mod v.c Cane exercises shoulder flexion reach for floor then red theraband attached to cane for  rows, mod v.c Activities on contant therapy: matching symbols level 1 -83% for visual scanning and attention, Sequencing task level 1 -74% Placing and removing graded clothespins from targets with R and left UE's for functional reach, min v.c   12/01/24 Pt practiced doffing and donning left shoe, sock and AFO brace, min v.c and demonstation with exception of assist for tying shoe tighter. Pt with good performance. Tabletop scanning, pt performed large print scanning with 100% accuracy then 61M scanning that items to locate changed each line, 2 errors. Completing 12 piece puzzle with mod-max v.c for performance and strategy, pt demonstrates continued impulsivity at times.  11/25/24-Therapist completed fabrication and fitting of wrist cock up splint and issued to patient. Pt's mother returned demonstration of application, Pt demonstrates ability to remove if issues.  11/24/24- UBE x 5 mins level 1 for conditioning, mod v.c for performance speed. Pt performed closed chain shoulder flexion with bilateral UE's reaching for floor. Passive stretch to left wrist in extension. Therapist initated fabrication of a wrist cock up splint. Splint was not completed due to time constraints.    11/18/24- Pt practiced simulated tub bench transfers with min v.c and assistance. Pt/s mom then dad observed and plan to have pt perform at home with assistance. Therapist recommends use of an adjustable shower head too. Cane exercises for shoulder flexion and chest press, mod v.c Using RUE to remove and replace wooden dowel pegs into pegboard with LUE, min v.c Simulated eating task, min v.c for speed UBE x 4 mins level 1 for conditioning.   11/10/24- Pt/ mom report pt donned pants with supervision today. Pt/ mom were instructed in red putty HEP for composite grip and tip pinch, mod v.c and demonstration for bilateral UE's Cane exercises for shoulder flexion, chest press and abduction to right side, mod v.c and  facilitation. Placing and removing graded clothespins from container for sustained pinch 1-8# with right and left UE's mod v.c Pt has a old today and he wore a masks for treatment.   11/03/24- eval         PATIENT EDUCATION: Education details see above.. Person educated: Patient  Education method: Explanation, demonstration, v.c,  Education comprehension: verbalized understanding, returned demonstration, v.c  HOME EXERCISE PROGRAM:  11/10/24- red putty, cane ex  GOALS: Goals reviewed with patient? Yes  SHORT TERM GOALS: Target date: 12/04/24  I with HEP Baseline: Goal status:  ongoing 11/18/24  2.  Pt will consistently donn shirt  with set up Baseline:  Goal status:met  11/18/24  3.  Pt/ caregiver will be I with positioning to minimize risk for LUE contracture  Goal status:  12/01/2624 ongoing wrist cock up splint issued  4.  Pt will donn pants with min A Baseline:  Goal status: met per mom's report 12/02/23  5.  Pt will increase bilateral UE functional use as evidenced by increasing box/ blocks score by 3 blocks bilaterally Baseline:  Goal status: ongoing 12/01/24  6.  Further assess vision and set goal prn. Baseline:  Goal status: ongoing assessing in functional context large print number cancellation 100% accuracy today. 12/01/24  LONG TERM GOALS: Target date: 01/26/25  I with updated HEP Baseline:  Goal status: INITIAL  2.  Pt will increase RUE grip strength to 37 # or greater for increased functional use. Baseline:  Goal status: INITIAL  3.  Pt will increase RUE shoulder flexion to 120 for increased functional reach. Baseline:  Goal status: INITIAL  4.  Pt will increase L shoulder flexion to 85* for increased ease with dressing. Baseline:  Goal status: INITIAL  5.  Pt will perform dressing with supervision/ setup Baseline:  Goal status: INITIAL  6.  Pt will perfrom bathing with min A Baseline:  Goal status: INITIAL  ASSESSMENT:  CLINICAL  IMPRESSION: Pt is progressing towards goals. Pt is progressing towards goals for UE functional reach however pt demonstrates decreased sitting balance when unsupported.  PERFORMANCE DEFICITS: in functional skills including ADLs, IADLs, coordination, dexterity, sensation, ROM, strength, pain, flexibility, Fine motor control, Gross motor control, mobility, balance, endurance, decreased knowledge of precautions, decreased knowledge of use of DME, and UE functional use, cognitive skills including attention, learn, problem solving, safety awareness, thought, and understand, and psychosocial skills including coping strategies, environmental adaptation, habits, interpersonal interactions, and routines and behaviors.   IMPAIRMENTS: are limiting patient from ADLs, IADLs, rest and sleep, play, leisure, and social participation.   CO-MORBIDITIES: may have co-morbidities  that affects occupational performance. Patient will benefit from skilled OT to address above impairments and improve overall function.  MODIFICATION OR ASSISTANCE TO COMPLETE EVALUATION: Min-Moderate modification of tasks or assist with assess necessary to complete an evaluation.  OT OCCUPATIONAL PROFILE AND HISTORY: Detailed assessment: Review of records and additional review of physical, cognitive, psychosocial history related to current functional performance.  CLINICAL DECISION MAKING: Moderate - several treatment options, min-mod task modification necessary  REHAB POTENTIAL: Good  EVALUATION COMPLEXITY: Moderate    PLAN:  OT FREQUENCY: 2x/week  OT DURATION: 12 weeks  PLANNED INTERVENTIONS: 97168 OT Re-evaluation, 97535 self care/ADL training, 02889 therapeutic exercise, 97530 therapeutic activity, 97112 neuromuscular re-education, 97140 manual therapy, 97035 ultrasound, 97018 paraffin, 02989 moist heat, 97010 cryotherapy, 97129 Cognitive training (first 15 min), 02869 Cognitive training(each additional 15 min), 02249 Physical  Performance Testing, 02239 Orthotic Initial, 97763 Orthotic/Prosthetic subsequent, passive range of motion, balance training, functional mobility training, visual/perceptual remediation/compensation, energy conservation, coping strategies training, patient/family education, and DME and/or AE instructions  RECOMMENDED OTHER SERVICES: n/a  CONSULTED AND AGREED WITH PLAN OF CARE: Patient and family member/caregiver  PLAN FOR NEXT SESSION:  functional UE use, ADLs   Vivianna Piccini, OT 12/03/2024, 4:32 PM           "

## 2024-12-03 ENCOUNTER — Encounter: Payer: Self-pay | Admitting: Physical Therapy

## 2024-12-03 ENCOUNTER — Ambulatory Visit: Admitting: Physical Therapy

## 2024-12-03 ENCOUNTER — Ambulatory Visit: Admitting: Occupational Therapy

## 2024-12-03 DIAGNOSIS — R41844 Frontal lobe and executive function deficit: Secondary | ICD-10-CM

## 2024-12-03 DIAGNOSIS — R29818 Other symptoms and signs involving the nervous system: Secondary | ICD-10-CM

## 2024-12-03 DIAGNOSIS — R41842 Visuospatial deficit: Secondary | ICD-10-CM

## 2024-12-03 DIAGNOSIS — M6281 Muscle weakness (generalized): Secondary | ICD-10-CM

## 2024-12-03 DIAGNOSIS — R2689 Other abnormalities of gait and mobility: Secondary | ICD-10-CM

## 2024-12-03 DIAGNOSIS — R278 Other lack of coordination: Secondary | ICD-10-CM

## 2024-12-03 DIAGNOSIS — M25632 Stiffness of left wrist, not elsewhere classified: Secondary | ICD-10-CM

## 2024-12-03 DIAGNOSIS — R41840 Attention and concentration deficit: Secondary | ICD-10-CM

## 2024-12-03 DIAGNOSIS — M25622 Stiffness of left elbow, not elsewhere classified: Secondary | ICD-10-CM

## 2024-12-03 NOTE — Therapy (Signed)
 " OUTPATIENT PHYSICAL THERAPY NEURO TREATMENT    Patient Name: Nicholas Holt MRN: [id number] DOB:01-Jul-1981, 44 y.o., male Today's Date: 12/03/2024  PCP: Ronnald Sergeant, MD REFERRING PROVIDER: Toribio Pitch, PA-C  END OF SESSION:  PT End of Session - 12/03/24 1453     Visit Number 7    Number of Visits 14    Date for Recertification  01/12/25    Progress Note Due on Visit 10    PT Start Time 1447    PT Stop Time 1525    PT Time Calculation (min) 38 min    Activity Tolerance Patient tolerated treatment well    Behavior During Therapy [facility] for tasks assessed/performed          Past Medical History:  Diagnosis Date   Anxiety    Attention deficit disorder 08/08/2006   Bilateral leg cramps 11/25/2006   Blood transfusion without reported diagnosis 1997   Chemotherapy associated   Brain cancer (HCC) 1998   Cough 12/08/2015   COVID-19 10/16/2017   Elevated hemidiaphragm 07/09/2015   Left, presumably after portacath placement.    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  Complicated by left sided motor tic disorder and panhypopituitarism.    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  Includes central diabetes insipidus, secondary hypothyroidism, secondary adrenal insufficiency, and secondary hypogonadism.    Perennial allergic rhinitis 07/09/2015   Pre-diabetes    no meds   Preventative health care 07/09/2015   Seizure disorder (HCC)  08/08/2006   Predated brain tumor.  Generalized seizure ocurred after running out of lorazepam  post-tumor.    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 Treated with alendronate  from 2009-2016. DEXA (03/16/2008): L Spine T -2.0. L fem neck T -0.9, R fem neck T -1.9.  DEXA (10/15/2014): L Spine T -1.3, L fem neck T -0.6, R fem neck T -1.8.  Bisphosphonate holiday 06/2015.  Repeat DEXA scan in 10/2016 to reassess.    Weakness 09/15/2023   Past Surgical History:  Procedure Laterality Date   BRAIN BIOPSY  11/14/1995   CSF SHUNT  11/14/1995   PORTACATH PLACEMENT     portacath removal     RADIOLOGY WITH ANESTHESIA N/A 08/30/2021   Procedure: MRI WITH ANESTHESIA BRAIN WITH AND WITHOUT CONTRAST;  Surgeon: Radiologist, Medication, MD;  Location: [facility] OR;  Service: Radiology;  Laterality: N/A;   RADIOLOGY WITH ANESTHESIA N/A 05/25/2022   Procedure: MRI WITH ANESTHESIA OF BRAIN WITH AND WITHOUT CONTRAST;  Surgeon: Radiologist, Medication, MD;  Location: [facility] OR;  Service: Radiology;  Laterality: N/A;   RADIOLOGY WITH ANESTHESIA N/A 10/05/2024   Procedure: MRI WITH ANESTHESIA;  Surgeon: Radiologist, Medication, MD;  Location: [facility] OR;  Service: Radiology;  Laterality: N/A;   Patient Active Problem List   Diagnosis Date Noted   Left basal ganglia embolic stroke (HCC) 10/10/2024   Small vessel stroke (HCC) 10/10/2024   Gait abnormality 10/03/2024   Diarrhea 07/08/2024   Functional urinary incontinence 07/08/2024   Class 1 obesity with  serious comorbidity and body mass index (BMI) of 30.0 to 30.9 in adult 04/23/2023   Prediabetes 10/10/2022   Elevated CK 10/10/2022   OSA (obstructive sleep apnea) 07/06/2022   Multiple lacunar infarcts (HCC) 05/04/2022   Hypertension 12/09/2019   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   Elevated hemidiaphragm 07/09/2015   Perennial allergic rhinitis 07/09/2015   H/O malignant neoplasm of brain 09/22/2014   Steroid-induced  osteoporosis 03/27/2008   Bilateral leg cramps 11/25/2006   Panhypopituitarism of anterior and posterior pituitary 08/08/2006   Hyperlipidemia 08/08/2006   Attention deficit disorder 08/08/2006   Seizure disorder (HCC) 08/08/2006    ONSET DATE: 10/03/24  REFERRING DIAG: I63.9 (ICD-10-CM) - Left basal ganglia embolic stroke (HCC)  THERAPY DIAG:  Other lack of coordination  Other symptoms and signs involving the nervous system  Other abnormalities of gait and mobility  Rationale for Evaluation and Treatment: Rehabilitation  SUBJECTIVE:                                                                                                                                                                                             SUBJECTIVE STATEMENT:  Doing good, no changes since last time     EVAL: Pt presents with his mother for PT eval. States that on 10/03/24 she noted decreased balance, pt attempting to use the wall for balance and was decreased from prior functional baseline. Pt denies pain in the last week, not likely to report per mother, but she does not believe he has been having any pain. Inquires about [name] shoe, already ordered, pt has slide plate on L toe of the shoe to assist with mitigating fall risk. Pt ind with cog assist prior to CVA, requires CGA for walking currently. Trialed hemiwalker, not indicated with prior PT.  Pt accompanied by: family member  PERTINENT HISTORY:   From Acute Care Eval: 10/06/24 Pt admitted with above diagnosis. At baseline, pt ambulatory without AD and enjoys going to the gym and bowling.  He does live with mother and father who assist with iadls and other task due to some cognitive and mobility deficits from prior CVA but still is fairly independent.  Pt with L hand contracture and L ankle contracture wearing AFO from prior CVA after brain tumor.  Today, pt requiring mod-max A for transfers due to posterior lean.  He did ambulate 64' with PT  but with mod A and unsafe gait pattern requiring frequent cues and stopping to regain balance and get close to RW, AFO in place.  Pt's strength difficult to assess completely due to unable to balance while sitting EOB, does appear  to be slightly weaker on L compared to R - however, that may be baseline.  From CIR Eval: 10/11/24 Lynwood BROCKS. Goulette is a 44 year old male with history of suprasellar germinoma s/p chemo and XRT '97 with residual Left hemiplegia cognitive deficits, panhypopituitarism, anxiety d/o, ADD who was admitted on 10/03/24 with 4 day history of difficulty walking and worsening of dysarthria. Family reported recent change of DDAVP  from intranasal to oral route and was advised to present to ED by endocrinology due to concerns of sodium level which was 129 at admission. CT head negative for acute changes and showed right burr hole from prior ventric with anterior right frontal encephalomalacia and chronic encephalomalacia left basal ganglia with finding c/w Fahr disease. Neurology consulted and MRI brain done revealing 8 mm acute infarct in posterior limb left internal capsule. CTA brain was negative for LVO. Dr. Jerri felt that stroke likely related to small vessel radiation related vasculopathy and recommended DAPT X 3 week followed by Plavix  alone.  PT note 10/30/24 Pt performs sit to stand and ambulates x50' to toilet with CGA and no AD with cues to increase Lt stride length to improve reciprocal gait pattern and balance. Following, pt ambulates x50' back to [facility] with same cues. Pt completes multiple additional bouts of ambulation to work on endurance, balance, and coordination, completing x100', x150', and x75' with seated rest breaks. Pt also completes 4 6 steps with Rt handrail and cues for safe step sequencing and positioning, with CGA provided overall.    PAIN:  Are you having pain? No 0/10 today, consistent with non-verbal communication  PRECAUTIONS: Fall and Other: Left sided  weakness and hemiplegia   RED FLAGS: None   WEIGHT BEARING RESTRICTIONS: No  FALLS: Has patient fallen in last 6 months? No  LIVING ENVIRONMENT: Lives with: lives with their family Lives in: House/apartment Stairs: Yes: Internal: flight steps; on right going up and External: 3 steps; none Has following equipment at home: Single point cane  PLOF: Independent  PATIENT GOALS: return to PLOF, helping with groceries, bowling  OBJECTIVE:  Note: Objective measures were completed at Evaluation unless otherwise noted.  DIAGNOSTIC FINDINGS:   IMPRESSION: 10/05/24 1. 8 mm acute infarction in the posterior limb of the internal capsule on the left. 2. Chronic postsurgical changes in the suprasellar region and hypothalamus. No evidence of recurrent neoplasm. 3. Chronic small-vessel ischemic changes elsewhere throughout the brain as outlined above. 4. 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  COGNITION: Overall cognitive status: History of cognitive impairments - at baseline   SENSATION: WFL  COORDINATION: Decreased with inc velocity of movements bilaterally, L>R, finger/nose, finger/finger, stepping pattern in sitting (R, L, R, L) able to improve with decreased cadence  EDEMA:  Slight increase in edema of the L ankle, retromalleolar  MUSCLE TONE: LLE: left flexion contracture of L wrist, slight inc from baseline. Mild hypertonicity in LLE with quick movements, WNL if moves slowly    DTRs:  Biceps 1 = Trace , Brachioradialis 1 = Trace , Triceps 1 = Trace , Patella 1 = Trace , and Achilles 1 = Trace   POSTURE: rounded shoulders, forward head, weight shift right, and weight shift left  LOWER EXTREMITY ROM:     Active  Right Eval Left Eval  Hip flexion WNL 114  Hip extension    Hip abduction 43 40  Hip adduction WNL WNL  Hip internal rotation    Hip external rotation    Knee flexion  125 118  Knee extension -4 -10  Ankle dorsiflexion 19  12  Ankle plantarflexion 30 26  Ankle inversion    Ankle eversion     (Blank rows = not tested)  LOWER EXTREMITY MMT:    MMT Right Eval Left Eval  Hip flexion 4/5 3+/5  Hip extension    Hip abduction 4+/5 3/5  Hip adduction 4+/5 4+/5  Hip internal rotation    Hip external rotation    Knee flexion 4/5 3-/5  Knee extension 4/5 3/5  Ankle dorsiflexion 3+/5 2/5  Ankle plantarflexion 3+/5 3/5  Ankle inversion    Ankle eversion    (Blank rows = not tested)   TRANSFERS: Sit to stand: SBA  Assistive device utilized: None      GAIT: Findings: Distance walked: 140ft, Assistive device utilized:Walker - 2 wheeled and None, Level of assistance: CGA, and Comments: frequent cues for pacing as pt is impulsive and attempted to complete increased cadence, with fatigue he is unable to maintain LLE demands from velocity, safety awareness deficits place him at inc risk of falls. Pt cued for step height and clearance during LLE swing phase, unable to maintain without cues and unable to maintain with inc fatigue. Cued for pacing at this point with improved safety taking smaller steps.                                                                                                                               TREATMENT DATE:   12/03/24  Nustep seat 15 L5x8 minutes all four extremities  STS with yellow ball OH Press x12 cues to use L UE as able/modified holds PRN Forward step ups 4 inch box on blue foam pad x10 B, U UE support  Lateral step ups 4 inch box on blue foam pad x5 B, Mod multimodal cues BUE support  Backward step ups 4 inch step L leg leading x10  Hip extension in // bars x10 L LE  Standing on blue foam pad, alternating toe taps x20, min guard and light U UE support  Side-stepping back and forth with back to counter- has difficulty not rotating trunk and compensating with hip flexors Forward/backwards walking in hall, cues for big steps     12/01/24 NuStep L5 x 6 min  Lateral  Step Overs on Airex  Lateral Step Overs + Number Sequencing  [place] taps forward and backward  Standing Marches on Airex  [place] Reaching on Airex  Stair Training Railing Use  STS 2x10 STS LLE elevated 2 1x10     11/25/24 NuStep L5 x 6 min  Toe Taps 4 railing use  Step Ups 4 railing use 1x10  Modified sit ups 2x10  With 1.5# on BLE: Step to target on floor to increase step length  Step over half foam roller SAQ 2x10 Seated half foam roll lateral step overs  Ambulation 150' SBA to CGA    11/24/24  Nustep L5x8 minutes  seat 15, all four extremities   Forward and lateral steps over half foam rolls x4 laps down and back each Alternating forward steps to target with 1.5# each foot to improve step length x20 total   Gait training 259ftx2 1.5# each LE, cues to slow down and improve step length/stance time, then 231ft with 0# with same cues  Forward/backward walking in bars x4 laps cues for large steps      11/21/24  Forward step ups 6 inch step L, mod cues for sequencing/safety, U UE support STS with 7# wt alternating between L hand and B hands on dumb-bell x10  Nustep L5x8 minutes all four extremities seat 15 Gait training 159ft with 1.5# each LE, cues to slow down and for longer step length L   Standing on blue foam pad EC/close min guard 3x30 seconds Side steps on blue foam x2 laps down and back UEs on bar (tried to back down to 1 but unable)       11/18/24   Gait assessment and training in Cadence shoes by themselves (had more of a shuffle gait pattern with poor toe clearance and anterior translation of wt/short fast steps), Cadence shoes + AFO (turned into more of a steppage gait pattern with poor motor control L LE), regular shoes + AFO (had best foot clearance although gait pattern certainly was not perfect)- additionally Cadence shoe toe box was a bit too long and too narrow for his forefoot, making risk of tripping and skin breakdown fairly high. Recommended  they use regular shoes and AFO for now.  Nustep L5x8 minutes all four extremities  seat 15 Wide tandem  blue foam pad 2x30 seconds B  Forward step downs 6 inch step very close min guard x10 B    11/03/24- EVAL    PATIENT EDUCATION: Education details: Pt educated on relevant anatomy, physiology, pathology, diagnosis, prognosis, progression of care, pain and activity modification related to s/p CVA 10/03/24 Person educated: Patient and Parent Education method: Explanation, Demonstration, and Handouts Education comprehension: verbalized understanding, returned demonstration, verbal cues required, and needs further education  HOME EXERCISE PROGRAM: Access Code: [email or web address] URL: [email or web address] Date: 11/03/2024 Prepared by: Stann Ohara  Exercises - Seated Hip Flexion March with Ankle Weights  - 2 x daily - 7 x weekly - 3 sets - 15 reps - 2 hold - Seated Long Arc Quad  - 2 x daily - 7 x weekly - 3 sets - 15 reps - 2 hold - Seated Heel Raise  - 2 x daily - 7 x weekly - 3 sets - 15 reps - 2 hold  GOALS: Goals reviewed with patient? Yes  SHORT TERM GOALS: Target date: 12/11/24   Pt will report compliance with HEP to work towards ind and home management strategies Baseline: Goal status: IN PROGRESS 11/25/24   2.  Pt will amb x395ft with LRAD at supervision assist or less to demonstrate improved activity tolerance and safety with home mobility Baseline:  Goal status: IN PROGRESS 11/25/24   3.  Pt will improve L LE AROM to full and painless in order to demonstrate progress towards activity tolerance and improved function Baseline:  Goal status: IN PROGRESS deficits remain in L LE 1/11/21/24  4.  Pt will demonstrate stair negotiation using non reciprocal pattern x1 flight with right handrail per home setup with CGA and demonstrate safety recall requiring caregiver presence to assist with steps Baseline: 2 steps into the house, mod A  Goal status: modA for 1 step up  and  down 11/25/24    LONG TERM GOALS: Target date: 01/12/25   Pt will amb x743ft with LRAD at supervision assist or less to demonstrate improved activity tolerance and safety with home mobility Baseline:  Goal status: IN PROGRESS SBA to CGA 11/25/24   2.  Pt will demonstrate 4/5 strength or greater in LLE to demonstrate improved strength and stability Baseline:  Goal status: IN PROGRESS knee ext of L LE improved to 3+/5 all others remain the same 12/01/24   3.  Pt will be ind in the management of their symptoms at home and in the community Baseline:  Goal status: IN PROGRESS requires assist 11/25/24   4.  Pt will complete unilateral carry of 5 pounds or greater a distance of 113ft to mimic assisting with groceries  Baseline:  Goal status: IN PROGRESS 11/25/24     ASSESSMENT:  CLINICAL IMPRESSION:  Arrives today doing well, his walking looked a lot better at the start of this session before fatiguing. Continued working on general coordination and balance activities today, continues to require cues to slow down movements and speech but shows a lot of improvement this week! Having some trouble with lateral movement, possibly due to ongoing issues with general coordination and tone- compensates by turning body and using hip flexors. Will continue to challenge as able/tolerated.    EVAL: Patient is a 44 y.o. M who was seen today for physical therapy evaluation and treatment for s/p CVA 10/03/24 with L sided deficits. Pt has baseline deficits in the L hemisphere due to brain tumor at age 47. Prior to this event, he was able to participate in grocery shopping and home activities, amb without AD. Pt currently demonstrates inc impulsivity with safety awareness concerns requiring frequent cues. Pt and family educated on maintained safety. Pt stands to benefit from continued skilled physical therapy to address deficit areas and restore safety with activities and participations at home and in the community.      OBJECTIVE IMPAIRMENTS: Abnormal gait, decreased activity tolerance, decreased balance, decreased cognition, decreased coordination, decreased endurance, decreased mobility, difficulty walking, decreased ROM, decreased strength, decreased safety awareness, increased edema, impaired tone, impaired UE functional use, impaired vision/preception, and postural dysfunction.   ACTIVITY LIMITATIONS: carrying, lifting, bending, standing, squatting, stairs, bathing, dressing, self feeding, and locomotion level  PARTICIPATION LIMITATIONS: shopping and community activity  PERSONAL FACTORS: Behavior pattern, Fitness, Past/current experiences, and Time since onset of injury/illness/exacerbation are also affecting patient's functional outcome.   REHAB POTENTIAL: Good  CLINICAL DECISION MAKING: Evolving/moderate complexity  EVALUATION COMPLEXITY: Moderate  PLAN:  PT FREQUENCY: 1-2x/week  PT DURATION: 10 weeks  PLANNED INTERVENTIONS: 97110-Therapeutic exercises, 97530- Therapeutic activity, [id number]- Neuromuscular re-education, 97535- Self Care, [id number]- Manual therapy, [id number]- Gait training, [phone]- Orthotic Initial, [id number]- Orthotic/Prosthetic subsequent, [id number]- Electrical stimulation (unattended), Patient/Family education, and Wheelchair mobility training  PLAN FOR NEXT SESSION: progress motor control, gait and balance, strength, stability, mobility, endurance through functional movement patterns. Keep working on improving gait pattern and balance, target hip ABDs.   Josette Rough, PT, DPT 12/03/24 3:26 PM      "

## 2024-12-05 ENCOUNTER — Ambulatory Visit

## 2024-12-05 DIAGNOSIS — R41841 Cognitive communication deficit: Secondary | ICD-10-CM

## 2024-12-05 DIAGNOSIS — R471 Dysarthria and anarthria: Secondary | ICD-10-CM

## 2024-12-05 NOTE — Therapy (Signed)
 " OUTPATIENT SPEECH LANGUAGE PATHOLOGY TREATMENT   Patient Name: Nicholas Holt MRN: [id number] DOB:1981/05/31, 44 y.o., male Today's Date: 12/05/2024  PCP: Nicholas Fellows, MD REFERRING PROVIDER: Pegge Holt PARAS, PA-C  END OF SESSION:  End of Session - 12/05/24 1040     Visit Number 5    Number of Visits 17    Date for Recertification  01/04/25    SLP Start Time 1017    SLP Stop Time  1100    SLP Time Calculation (min) 43 min    Activity Tolerance Patient tolerated treatment well            Past Medical History:  Diagnosis Date   Anxiety    Attention deficit disorder 08/08/2006   Bilateral leg cramps 11/25/2006   Blood transfusion without reported diagnosis 1997   Chemotherapy associated   Brain cancer (HCC) 1998   Cough 12/08/2015   COVID-19 10/16/2017   Elevated hemidiaphragm 07/09/2015   Left, presumably after portacath placement.    Fall 04/13/2022   H/O malignant neoplasm of brain 09/22/2014   Suprasellar germinoma treated at [facility] in 1997; received chemotherapy (cisplatin, etoposide, vincristine, and cyclophosphomide) and radiation therapy.  Complicated by left sided motor tic disorder and panhypopituitarism.    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  Includes central diabetes insipidus, secondary hypothyroidism, secondary adrenal insufficiency, and secondary hypogonadism.    Perennial allergic rhinitis 07/09/2015   Pre-diabetes    no meds   Preventative health care 07/09/2015   Seizure disorder (HCC) 08/08/2006   Predated brain tumor.  Generalized seizure ocurred after running out of  lorazepam  post-tumor.    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 Treated with alendronate  from 2009-2016. DEXA (03/16/2008): L Spine T -2.0. L fem neck T -0.9, R fem neck T -1.9.  DEXA (10/15/2014): L Spine T -1.3, L fem neck T -0.6, R fem neck T -1.8.  Bisphosphonate holiday 06/2015.  Repeat DEXA scan in 10/2016 to reassess.    Weakness 09/15/2023   Past Surgical History:  Procedure Laterality Date   BRAIN BIOPSY  11/14/1995   CSF SHUNT  11/14/1995   PORTACATH PLACEMENT     portacath removal     RADIOLOGY WITH ANESTHESIA N/A 08/30/2021   Procedure: MRI WITH ANESTHESIA BRAIN WITH AND WITHOUT CONTRAST;  Surgeon: Radiologist, Medication, MD;  Location: [facility] OR;  Service: Radiology;  Laterality: N/A;   RADIOLOGY WITH ANESTHESIA N/A 05/25/2022   Procedure: MRI WITH ANESTHESIA OF BRAIN WITH AND WITHOUT CONTRAST;  Surgeon: Radiologist, Medication, MD;  Location: [facility] OR;  Service: Radiology;  Laterality: N/A;   RADIOLOGY WITH ANESTHESIA N/A 10/05/2024   Procedure: MRI WITH ANESTHESIA;  Surgeon: Radiologist, Medication, MD;  Location: [facility] OR;  Service: Radiology;  Laterality: N/A;   Patient Active Problem List   Diagnosis Date Noted   Left basal ganglia embolic stroke (HCC) 10/10/2024   Small vessel stroke (HCC) 10/10/2024   Gait abnormality 10/03/2024   Diarrhea 07/08/2024   Functional urinary incontinence 07/08/2024   Class 1 obesity with serious comorbidity and body mass index (BMI) of 30.0 to 30.9 in adult 04/23/2023   Prediabetes  10/10/2022   Elevated CK 10/10/2022   OSA (obstructive sleep apnea) 07/06/2022   Multiple lacunar infarcts (HCC) 05/04/2022   Hypertension 12/09/2019   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   Elevated hemidiaphragm 07/09/2015   Perennial allergic rhinitis 07/09/2015   H/O malignant neoplasm of brain 09/22/2014   Steroid-induced osteoporosis 03/27/2008   Bilateral leg cramps 11/25/2006   Panhypopituitarism of anterior  and posterior pituitary 08/08/2006   Hyperlipidemia 08/08/2006   Attention deficit disorder 08/08/2006   Seizure disorder (HCC) 08/08/2006    ONSET DATE: Referred on 10/31/24   REFERRING DIAG: I63.9 (ICD-10-CM) - Left basal ganglia embolic stroke (HCC)   THERAPY DIAG:  Dysarthria and anarthria  Cognitive communication deficit  Rationale for Evaluation and Treatment: Rehabilitation  SUBJECTIVE:   SUBJECTIVE STATEMENT: How are you doing [name]?  Pt accompanied by: self  PERTINENT HISTORY: brain CA 1997, CVA  PAIN:  Are you having pain? No  FALLS: Has patient fallen in last 6 months?  No    PATIENT GOALS: speech  OBJECTIVE:  Note: Objective measures were completed at Evaluation unless otherwise noted.  DIAGNOSTIC FINDINGS: :Per EMR  MR BRAIN W WO CONTRAST  Date: 10/04/2024   IMPRESSION: 1. 8 mm acute infarction in the posterior limb of the internal capsule on the left. 2. Chronic postsurgical changes in the suprasellar region and hypothalamus. No evidence of recurrent neoplasm. 3. Chronic small-vessel ischemic changes elsewhere throughout the brain as outlined above. 4. 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  COGNITION: Overall cognitive status: Impaired: Areas of impairment:  Attention: Impaired: Selective, Alternating, Divided Memory: Impaired: Short term Prospective Auditory Awareness: Impaired: Emergent Executive function: Impaired: Impulse control, Problem solving, Organization, Planning, Error awareness, Self-correction, and Slow processing  and History of cognitive impairments - at baseline Functional deficits: See clinical impression  COGNITIVE COMMUNICATION: Following directions: Follows multi-step commands inconsistently  Auditory comprehension: WFL Verbal expression: WFL Functional communication: Impaired: recall of information in conversations   ORAL MOTOR EXAMINATION: Overall status: Impaired;   Comments: Facial ROM: Reduced left;Suspected CN VII (facial) dysfunction Facial Symmetry: Abnormal symmetry left;Suspected CN VII (facial) dysfunction Lingual ROM: Other (Comment) (imprecise) Lingual Symmetry: Within Functional Limits   MOTOR SPEECH: Overall motor speech: impaired Level of impairment: Phrase, Sentence, and Conversation Respiration: thoracic breathing Phonation: normal Resonance: WFL Articulation: Impaired: phrase, sentence, and conversation Intelligibility: Intelligibility reduced ~ 25% to unfamiliar listener Motor planning: Appears intact  Effective technique: slow rate, increased vocal intensity, pause, and pacing   STANDARDIZED ASSESSMENTS:   Cognitive Linguistic Quick Test: AGE - 18 - 69   The Cognitive Linguistic Quick Test (CLQT) was administered to assess the relative status of five cognitive domains: attention, memory, language, executive functioning, and visuospatial skills. Scores from 10 tasks were used to estimate severity ratings (standardized for age groups 18-69 years and 70-89 years) for each domain, a clock drawing task, as well as an overall composite severity rating of cognition.       Task Score Criterion Cut Scores  Personal Facts 8/8 8  Symbol Cancellation 9/12 11  Confrontation Naming 10/10 10  Clock Drawing  6/13 12  Story Retelling 4/10 6  Symbol Trails 1/10 9  Generative Naming 3/9 5  Design Memory 2/6 5  Mazes  0/8 7  Design Generation 3/13 6    Cognitive Domain Composite Score Severity Rating  Attention 99/215 Moderate  Memory 103/185 Severe  Executive Function 9/40 Severe  Language 25/37 Mild  Visuospatial Skills 35/105 Severe  Clock  Drawing  6/13 Severe  Composite Severity Rating  Moderate    PATIENT REPORTED OUTCOME MEASURES (PROM):   Neuro- QOL - Cognitive Function PROM   -Adonnis: 61 - Mom: 37                                                                                                                             TREATMENT DATE:   12/05/24: SLP brought pt back to ST room and pt cont'd to ask SLP how he was feeling. SLP targeted more intelligible speech with pt. Mother stated that they worked at home with pt's louder articulation. Today in phrase responses pt req'd consistent mod cues for more intelligible speech, faded to usual min-mod A for intelligible speech. SLP provided more homework for pt to work on at home.   11/28/24: SLP brought pt back to ST room and he cont'd to ask SLP how SLP was today. SLP introduced pt to speech strategies and worked on pt repeating demographic info with SLOW and LOUD. Pt req'd consistent mod cues and benefited most from simultaneous production.  With 2-4 syllable word level responses pt demonstrated intelligible speech using loud and clear cue consistently. SLP used negative practice technique but this did not appear to improve pt response. In sentence responses (4-7 syllables) pt req'd consistent min-mod cues to start loud and finish loud. SLP provided pt homework to practice this approx 15 min/day until next session.   11/22/23: Pt was seen for skilled ST services targeting intelligibility strategies. Mother reports they have not had time to practice at home, but she has been reminding him to slow down. SLP facilitated session by reading functional phrases. SLP trialed pacing by tapping leg/table, as well as, increasing loudness. Overarticulation is too difficult at this time. Intelligibility increased significantly with increased loudness and slowed rate. This did not transfer over into speech today. Continued practice on strategies at reading (phrase) level. SLP provided pt with HEP (phrases list).   *Pt mother reported they do not want to seek services at Neuro at this time. To follow up with PRN SLP for 3 visits and decide next steps.   11/10/24: Completed CLQT - see above for scores. Reviewed with mom and patient. No questions at this time. Mother reports her main focus is  communication and is wanting to focus speech intelligibility at this time. SLP encouraged mom to think about memory strategies as well as pt is completely dependent on parents. Mom open to working on memory. OT to assist with facilitating cognition within ADLs. SLP provided pt with speech intelligibility strategies re: SLOP today. To focus on over-articulation.   **Neuro is likely closest. Pt would likely seek ST at different location - but would cont OT/PT here.**    PATIENT EDUCATION: Education details: cognitive-communication Person educated: Patient and Parent Education method: Explanation Education comprehension: verbalized understanding   GOALS: Goals reviewed with patient? Yes  SHORT TERM GOALS: Target date: 12/05/24  Complete PROM, testing, and  update goals Baseline: Goal status: MET  2.  Pt will verbalize 2 strategies to improve speech intelligibility.  Baseline:  Goal status: PROGRESSING  3.  Pt will recall the following Mneumonic  STPD (STOP THINK PLAN DO).  Baseline:  Goal status: INITIAL    LONG TERM GOALS: Target date: 01/05/25  Pt will improve score on PROM Baseline:  Goal status: INITIAL  2.  Pt/mom will report use of external memory strategies  Baseline:  Goal status: INITIAL  3.  Pt will demonstrate use of 1 speech intelligibility strategy in structured conversation with minA verbal cues.  Baseline:  Goal status: INITIAL   ASSESSMENT:  CLINICAL IMPRESSION: Pt is a 44 yo male who presented to ST OP for evaluation post CVA. SEE TX NOTE.   @ eval: Pt was accompanied by mother for today's evaluation. Pt with baseline deficits following brain CA in 1997. Mom reports changes in thinking skills post CVA re: impulsivity (specifically with eating/movement), short term memory, and focus, as well as, and speech.  Pt was assessed using CLQT and informal speech measures. Pt speech observed to be around 25% intelligible to an unfamiliar listener and c/b re: rapid  rate, imprecise articulation. SLP attempted to pace patient with asking close-ended questions, which appeared to be helpful. Pt required cueing to wait during assessment, as pt would try to take pen from therapist hand while she was explaining/demonstrating directions.  He also benefited from repetition throughout evaluation. SLP rec skilled ST services to address dysarthria and cognitive-communication impairment.    OBJECTIVE IMPAIRMENTS: include attention, memory, awareness, executive functioning, and dysarthria. These impairments are limiting patient from managing medications, managing appointments, managing finances, household responsibilities, ADLs/IADLs, and effectively communicating at home and in community. Factors affecting potential to achieve goals and functional outcome are ability to learn/carryover information 2/2 to memory impairment. Patient will benefit from skilled SLP services to address above impairments and improve overall function.  REHAB POTENTIAL: Fair 2/2 to memory impairment; however, great caregiver support.   PLAN:  SLP FREQUENCY: 2x/week  SLP DURATION: 8 weeks  PLANNED INTERVENTIONS: Environmental controls, Cueing hierachy, SLP instruction and feedback, Compensatory strategies, Patient/family education, and [id number] Treatment of speech (30 or 45 min)     Marylin Lathon, CCC-SLP 12/05/2024, 10:40 AM          "

## 2024-12-08 ENCOUNTER — Ambulatory Visit: Admitting: Physical Therapy

## 2024-12-08 ENCOUNTER — Ambulatory Visit: Admitting: Occupational Therapy

## 2024-12-10 ENCOUNTER — Ambulatory Visit: Admitting: Physical Therapy

## 2024-12-10 ENCOUNTER — Ambulatory Visit

## 2024-12-10 ENCOUNTER — Ambulatory Visit: Admitting: Occupational Therapy

## 2024-12-17 ENCOUNTER — Ambulatory Visit: Admitting: Physical Therapy

## 2024-12-17 ENCOUNTER — Ambulatory Visit: Admitting: Occupational Therapy

## 2024-12-18 ENCOUNTER — Encounter: Admitting: Physical Medicine & Rehabilitation

## 2024-12-18 ENCOUNTER — Encounter: Payer: Self-pay | Admitting: Physical Medicine & Rehabilitation

## 2024-12-18 VITALS — BP 131/86 | HR 60 | Ht 68.0 in | Wt 209.0 lb

## 2024-12-18 DIAGNOSIS — R252 Cramp and spasm: Secondary | ICD-10-CM

## 2024-12-18 DIAGNOSIS — I639 Cerebral infarction, unspecified: Secondary | ICD-10-CM

## 2024-12-18 DIAGNOSIS — I69398 Other sequelae of cerebral infarction: Secondary | ICD-10-CM

## 2024-12-18 MED ORDER — BACLOFEN 10 MG PO TABS: 10.0000 mg | ORAL_TABLET | Freq: Three times a day (TID) | ORAL | 1 refills | Status: AC

## 2024-12-18 NOTE — Progress Notes (Signed)
 "  Subjective:    Patient ID: Nicholas Holt, male    DOB: 1980-12-30, 44 y.o.   MRN: [id number]  HPI Discussed the use of AI scribe software for clinical note transcription with the patient, who gave verbal consent to proceed.  History of Present Illness Nicholas Holt is a 44 year old male with left hemiplegia and spasticity following cerebral infarction who presents for follow-up of left lower extremity spasticity and cramping.  He was hospitalized for nearly one month for stroke rehabilitation and subsequently discharged home. He continues outpatient therapy at [facility] ([place] with occasional missed sessions due to inclement weather and limited driveway access.  He ambulates better than prior to his stroke but continues to experience significant cramping in the left foot and ankle, primarily nocturnally. He uses a night splint regularly, which is sometimes removed in the early morning, but nocturnal cramps persist on some nights despite this intervention.  He maintains activity at home, including ambulation within the house and use of a stationary bicycle for lower extremity exercise. His caregiver assists with exercises such as toe dorsiflexion and seated leg movements. He occasionally experiences a sensation of pain in the left lower extremity, but sometimes perceives pain without actual discomfort, which he recognizes as a common post-stroke phenomenon.  He is currently prescribed baclofen  and cyclobenzaprine  for spasticity, but his primary care provider recently advised using only one of these medications. Baclofen  is taken as one tablet in the morning and two tablets at night. He previously received botulinum toxin injections for spasticity in December 2025 during his hospital stay.    Pain Inventory Average Pain 0 Pain Right Now 0 My pain is intermittent and cramps  LOCATION OF PAIN  lett foot, left ankle  BOWEL Number of stools per week:3-7 Oral laxative use No    BLADDER Normal Incomplete bladder emptying at times urge to void but no urine   Mobility walk without assistance ability to climb steps?  yes do you drive?  no Do you have any goals in this area?  yes  Function disabled: date disabled age 50 I need assistance with the following:  bathing, meal prep, household duties, and shopping Do you have any goals in this area?  yes  Neuro/Psych weakness numbness trouble walking spasms  Prior Studies Any changes since last visit?  no  Physicians involved in your care Any changes since last visit?  no   Family History  Problem Relation Age of Onset   Hypertension Mother    Hypertension Father    Hyperlipidemia Father    Prostate cancer Father    Diabetes type I Sister        Died of complications of diabetes in her 93's   Sleep apnea Maternal Aunt    Social History   Socioeconomic History   Marital status: Single    Spouse name: Not on file   Number of children: Not on file   Years of education: Not on file   Highest education level: Not on file  Occupational History   Not on file  Tobacco Use   Smoking status: Never    Passive exposure: Never   Smokeless tobacco: Never  Vaping Use   Vaping status: Never Used  Substance and Sexual Activity   Alcohol use: No    Alcohol/week: 0.0 standard drinks of alcohol   Drug use: No   Sexual activity: Not on file  Other Topics Concern   Not on file  Social History Narrative  Lives with parents.  Parents retired. Father or mother take him to appointments.  Mother sends notes if she can not take him.   Social Drivers of Health   Tobacco Use: Low Risk (12/18/2024)   Patient History    Smoking Tobacco Use: Never    Smokeless Tobacco Use: Never    Passive Exposure: Never  Financial Resource Strain: Low Risk (08/06/2024)   Overall Financial Resource Strain (CARDIA)    Difficulty of Paying Living Expenses: Not hard at all  Food Insecurity: No Food Insecurity (10/04/2024)    Epic    Worried About Programme Researcher, Broadcasting/film/video in the Last Year: Never true    Ran Out of Food in the Last Year: Never true  Transportation Needs: No Transportation Needs (10/04/2024)   Epic    Lack of Transportation (Medical): No    Lack of Transportation (Non-Medical): No  Physical Activity: Inactive (08/06/2024)   Exercise Vital Sign    Days of Exercise per Week: 0 days    Minutes of Exercise per Session: 0 min  Stress: No Stress Concern Present (08/06/2024)   [employer] of Occupational Health - Occupational Stress Questionnaire    Feeling of Stress: Not at all  Social Connections: Patient Declined (08/06/2024)   Social Connection and Isolation Panel    Frequency of Communication with Friends and Family: Patient declined    Frequency of Social Gatherings with Friends and Family: Patient declined    Attends Religious Services: Patient declined    Active Member of Clubs or Organizations: Patient declined    Attends Banker Meetings: Patient declined    Marital Status: Patient declined  Depression (PHQ2-9): Low Risk (12/18/2024)   Depression (PHQ2-9)    PHQ-2 Score: 0  Alcohol Screen: Low Risk (08/06/2024)   Alcohol Screen    Last Alcohol Screening Score (AUDIT): 0  Housing: Low Risk (10/04/2024)   Epic    Unable to Pay for Housing in the Last Year: No    Number of Times Moved in the Last Year: 0    Homeless in the Last Year: No  Utilities: Not At Risk (10/04/2024)   Epic    Threatened with loss of utilities: No  Health Literacy: Not on file   Past Surgical History:  Procedure Laterality Date   BRAIN BIOPSY  11/14/1995   CSF SHUNT  11/14/1995   PORTACATH PLACEMENT     portacath removal     RADIOLOGY WITH ANESTHESIA N/A 08/30/2021   Procedure: MRI WITH ANESTHESIA BRAIN WITH AND WITHOUT CONTRAST;  Surgeon: Radiologist, Medication, MD;  Location: [facility] OR;  Service: Radiology;  Laterality: N/A;   RADIOLOGY WITH ANESTHESIA N/A 05/25/2022   Procedure: MRI WITH  ANESTHESIA OF BRAIN WITH AND WITHOUT CONTRAST;  Surgeon: Radiologist, Medication, MD;  Location: [facility] OR;  Service: Radiology;  Laterality: N/A;   RADIOLOGY WITH ANESTHESIA N/A 10/05/2024   Procedure: MRI WITH ANESTHESIA;  Surgeon: Radiologist, Medication, MD;  Location: [facility] OR;  Service: Radiology;  Laterality: N/A;   Past Medical History:  Diagnosis Date   Anxiety    Attention deficit disorder 08/08/2006   Bilateral leg cramps 11/25/2006   Blood transfusion without reported diagnosis 1997   Chemotherapy associated   Brain cancer (HCC) 1998   Cough 12/08/2015   COVID-19 10/16/2017   Elevated hemidiaphragm 07/09/2015   Left, presumably after portacath placement.    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  Complicated by left sided motor tic disorder and panhypopituitarism.    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  Includes central diabetes insipidus, secondary hypothyroidism, secondary adrenal insufficiency, and secondary hypogonadism.    Perennial allergic rhinitis 07/09/2015   Pre-diabetes    no meds   Preventative health care 07/09/2015   Seizure disorder (HCC) 08/08/2006   Predated brain tumor.  Generalized seizure ocurred after running out of lorazepam  post-tumor.    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 Treated with alendronate  from 2009-2016. DEXA (03/16/2008): L Spine T -2.0. L fem neck T -0.9, R  fem neck T -1.9.  DEXA (10/15/2014): L Spine T -1.3, L fem neck T -0.6, R fem neck T -1.8.  Bisphosphonate holiday 06/2015.  Repeat DEXA scan in 10/2016 to reassess.    Weakness 09/15/2023   BP 131/86   Pulse 60   Ht 5' 8 (1.727 m)   Wt 209 lb (94.8 kg)   SpO2 100%   BMI 31.78 kg/m   Opioid Risk Score:   Fall Risk Score:  `1  Depression screen [facility] 2/9     12/18/2024   11:16 AM 11/17/2024    9:49 AM 08/06/2024   11:57 AM 07/08/2024    8:20 AM 03/31/2024    2:38 PM 01/08/2024   11:03 AM 09/14/2023    9:35 AM  Depression screen PHQ 2/9  Decreased Interest 0 0 0 0 0 0 0  Down, Depressed, Hopeless 0 0 0 0 0 0 0  PHQ - 2 Score 0 0 0 0 0 0 0  Altered sleeping  0 0      Tired, decreased energy  0 0      Change in appetite  0 0      Feeling bad or failure about yourself   0 0      Trouble concentrating  1 0      Moving slowly or fidgety/restless  2 0      Suicidal thoughts  0 0      PHQ-9 Score  3 0       Difficult doing work/chores   Not difficult at all         Data saved with a previous flowsheet row definition    Review of Systems  Musculoskeletal:        Spasms  Neurological:  Positive for numbness.  All other systems reviewed and are negative.      Objective:   Physical Exam  General no acute distress Mood and affect appropriate Extremities without edema He ambulates with an AFO but no other assistive device he is able to clear the toe.  No knee buckling noted. Motor strength is 3 - at the left deltoid bicep to minus tricep trace finger flexors 0 finger extensors 0 wrist extensors he does have a wrist contracture on the left side as well Left lower limb 4 - at the hip flexor knee extensor 0 at ankle dorsiflexor and plantar flexor Tone MAS 4 at the left wrist flexors 3 at the left finger flexors 3 at the elbow  flexors and 2/3 at the ankle plantar flexors Speech with mild to moderate dysarthria no evidence of aphasia. No evidence of left neglect during mobility      Assessment & Plan:   Assessment and Plan Assessment & Plan Spasticity of left upper and lower extremities secondary to left basal ganglia stroke Chronic spasticity with nocturnal cramping. Current baclofen  dosing subtherapeutic. Previous onabotulinumtoxinA injection in December 2025. Consider targeting calf with onabotulinumtoxinA if symptoms persist. Baclofen  preferred over cyclobenzaprine . - Increased baclofen  to 10 mg in the morning and 20 mg at night. - Prescribed 90-day supply of baclofen  (270 tablets) with one refill, to be filled at [facility] on [address]. - Discussed scheduling next onabotulinumtoxinA injection after three-month interval, tentatively planned for late March. - Discussed targeting calf with onabotulinumtoxinA at next injection if symptoms persist.  Would also treat left upper extremity in the wrist and finger flexors - Reviewed use of night splint and ongoing home exercise program.  Functional deficits secondary to left basal ganglia stroke Persistent functional deficits with improved mobility since rehabilitation. Ongoing limitations in left foot and ankle control. - Encouraged continuation of outpatient therapy at [facility] and adherence to home exercise program, including ambulation and stationary bicycle use. - Reinforced use of adaptive equipment (night splint, properly fitted shoes) to support mobility and prevent contractures. - Provided anticipatory guidance on maintaining activity levels despite environmental limitations.   "

## 2024-12-18 NOTE — Patient Instructions (Signed)
 Contains text generated by [employer].

## 2024-12-23 ENCOUNTER — Inpatient Hospital Stay: Admitting: Diagnostic Neuroimaging

## 2024-12-24 ENCOUNTER — Ambulatory Visit: Admitting: Occupational Therapy

## 2024-12-24 ENCOUNTER — Ambulatory Visit: Admitting: Physical Therapy

## 2024-12-26 ENCOUNTER — Ambulatory Visit: Admitting: Physical Therapy

## 2024-12-29 ENCOUNTER — Ambulatory Visit: Admitting: Occupational Therapy

## 2024-12-29 ENCOUNTER — Ambulatory Visit: Admitting: Physical Therapy

## 2024-12-31 ENCOUNTER — Ambulatory Visit: Admitting: Physical Therapy

## 2024-12-31 ENCOUNTER — Ambulatory Visit: Admitting: Occupational Therapy

## 2025-01-02 ENCOUNTER — Ambulatory Visit: Admitting: Podiatry

## 2025-01-05 ENCOUNTER — Ambulatory Visit: Admitting: Physical Therapy

## 2025-01-05 ENCOUNTER — Ambulatory Visit: Admitting: Occupational Therapy

## 2025-01-08 ENCOUNTER — Ambulatory Visit: Admitting: Physical Therapy

## 2025-01-08 ENCOUNTER — Ambulatory Visit: Admitting: Occupational Therapy

## 2025-01-29 ENCOUNTER — Encounter: Admitting: Physical Medicine & Rehabilitation

## 2025-08-12 ENCOUNTER — Ambulatory Visit
